# Patient Record
Sex: Male | Born: 1970
Health system: Southern US, Community
[De-identification: ages and names within clinical notes are randomized; demographics above are authoritative.]

## PROBLEM LIST (undated history)

## (undated) DIAGNOSIS — Z87442 Personal history of urinary calculi: Secondary | ICD-10-CM

## (undated) DIAGNOSIS — IMO0002 Reserved for concepts with insufficient information to code with codable children: Secondary | ICD-10-CM

## (undated) DIAGNOSIS — E669 Obesity, unspecified: Secondary | ICD-10-CM

## (undated) DIAGNOSIS — T7840XA Allergy, unspecified, initial encounter: Secondary | ICD-10-CM

## (undated) DIAGNOSIS — I358 Other nonrheumatic aortic valve disorders: Secondary | ICD-10-CM

## (undated) DIAGNOSIS — M545 Low back pain, unspecified: Secondary | ICD-10-CM

## (undated) DIAGNOSIS — N4830 Priapism, unspecified: Secondary | ICD-10-CM

## (undated) DIAGNOSIS — K219 Gastro-esophageal reflux disease without esophagitis: Secondary | ICD-10-CM

## (undated) DIAGNOSIS — F329 Major depressive disorder, single episode, unspecified: Secondary | ICD-10-CM

## (undated) DIAGNOSIS — M1990 Unspecified osteoarthritis, unspecified site: Secondary | ICD-10-CM

## (undated) DIAGNOSIS — M48061 Spinal stenosis, lumbar region without neurogenic claudication: Secondary | ICD-10-CM

## (undated) DIAGNOSIS — I10 Essential (primary) hypertension: Secondary | ICD-10-CM

## (undated) DIAGNOSIS — F1910 Other psychoactive substance abuse, uncomplicated: Secondary | ICD-10-CM

## (undated) DIAGNOSIS — Z202 Contact with and (suspected) exposure to infections with a predominantly sexual mode of transmission: Secondary | ICD-10-CM

## (undated) DIAGNOSIS — M129 Arthropathy, unspecified: Secondary | ICD-10-CM

## (undated) DIAGNOSIS — F419 Anxiety disorder, unspecified: Secondary | ICD-10-CM

## (undated) DIAGNOSIS — F32A Depression, unspecified: Secondary | ICD-10-CM

## (undated) DIAGNOSIS — I2510 Atherosclerotic heart disease of native coronary artery without angina pectoris: Secondary | ICD-10-CM

## (undated) DIAGNOSIS — R011 Cardiac murmur, unspecified: Secondary | ICD-10-CM

## (undated) DIAGNOSIS — J189 Pneumonia, unspecified organism: Secondary | ICD-10-CM

## (undated) DIAGNOSIS — D649 Anemia, unspecified: Secondary | ICD-10-CM

## (undated) HISTORY — DX: Major depressive disorder, single episode, unspecified: F32.9

## (undated) HISTORY — PX: GASTRIC BYPASS: SHX52

## (undated) HISTORY — DX: Allergy, unspecified, initial encounter: T78.40XA

## (undated) HISTORY — DX: Priapism, unspecified: N48.30

## (undated) HISTORY — DX: Low back pain: M54.5

## (undated) HISTORY — DX: Anxiety disorder, unspecified: F41.9

## (undated) HISTORY — DX: Other psychoactive substance abuse, uncomplicated: F19.10

## (undated) HISTORY — DX: Arthropathy, unspecified: M12.9

## (undated) HISTORY — PX: ABDOMINAL SURGERY: SHX537

## (undated) HISTORY — PX: BACK SURGERY: SHX140

## (undated) HISTORY — DX: Contact with and (suspected) exposure to infections with a predominantly sexual mode of transmission: Z20.2

## (undated) HISTORY — DX: Low back pain, unspecified: M54.50

## (undated) HISTORY — DX: Obesity, unspecified: E66.9

## (undated) HISTORY — DX: Reserved for concepts with insufficient information to code with codable children: IMO0002

## (undated) HISTORY — DX: Personal history of urinary calculi: Z87.442

## (undated) HISTORY — PX: ANKLE SURGERY: SHX546

## (undated) HISTORY — PX: OTHER SURGICAL HISTORY: SHX169

## (undated) HISTORY — DX: Depression, unspecified: F32.A

## (undated) HISTORY — PX: KNEE SURGERY: SHX244

## (undated) HISTORY — PX: HERNIA REPAIR: SHX51

---

## 1998-04-19 ENCOUNTER — Emergency Department (HOSPITAL_COMMUNITY): Admission: EM | Admit: 1998-04-19 | Discharge: 1998-04-19 | Payer: Self-pay | Admitting: Emergency Medicine

## 1999-05-03 ENCOUNTER — Emergency Department (HOSPITAL_COMMUNITY): Admission: EM | Admit: 1999-05-03 | Discharge: 1999-05-03 | Payer: Self-pay | Admitting: Emergency Medicine

## 2001-02-16 ENCOUNTER — Encounter: Payer: Self-pay | Admitting: Emergency Medicine

## 2001-02-16 ENCOUNTER — Emergency Department (HOSPITAL_COMMUNITY): Admission: EM | Admit: 2001-02-16 | Discharge: 2001-02-16 | Payer: Self-pay | Admitting: Emergency Medicine

## 2001-02-19 ENCOUNTER — Encounter: Payer: Self-pay | Admitting: General Surgery

## 2001-02-19 ENCOUNTER — Ambulatory Visit (HOSPITAL_COMMUNITY): Admission: RE | Admit: 2001-02-19 | Discharge: 2001-02-19 | Payer: Self-pay | Admitting: General Surgery

## 2001-03-06 HISTORY — PX: ANKLE SURGERY: SHX546

## 2001-07-12 ENCOUNTER — Emergency Department (HOSPITAL_COMMUNITY): Admission: EM | Admit: 2001-07-12 | Discharge: 2001-07-12 | Payer: Self-pay | Admitting: Emergency Medicine

## 2001-07-12 ENCOUNTER — Encounter: Payer: Self-pay | Admitting: Emergency Medicine

## 2002-03-06 HISTORY — PX: KNEE SURGERY: SHX244

## 2003-02-19 ENCOUNTER — Emergency Department (HOSPITAL_COMMUNITY): Admission: EM | Admit: 2003-02-19 | Discharge: 2003-02-19 | Payer: Self-pay | Admitting: Emergency Medicine

## 2003-03-07 HISTORY — PX: HERNIA REPAIR: SHX51

## 2004-07-03 ENCOUNTER — Inpatient Hospital Stay (HOSPITAL_COMMUNITY): Admission: EM | Admit: 2004-07-03 | Discharge: 2004-07-10 | Payer: Self-pay | Admitting: Emergency Medicine

## 2004-07-05 ENCOUNTER — Encounter: Admission: RE | Admit: 2004-07-05 | Discharge: 2004-07-05 | Payer: Self-pay | Admitting: Internal Medicine

## 2004-07-06 HISTORY — PX: INCISION AND DRAINAGE ABSCESS: SHX5864

## 2004-07-08 HISTORY — PX: IRRIGATION AND DEBRIDEMENT ABSCESS: SHX5252

## 2004-08-03 ENCOUNTER — Encounter: Admission: RE | Admit: 2004-08-03 | Discharge: 2004-11-01 | Payer: Self-pay | Admitting: Orthopedic Surgery

## 2005-09-21 ENCOUNTER — Ambulatory Visit: Payer: Self-pay | Admitting: Family Medicine

## 2005-10-03 ENCOUNTER — Ambulatory Visit: Payer: Self-pay | Admitting: Family Medicine

## 2005-10-12 ENCOUNTER — Encounter: Admission: RE | Admit: 2005-10-12 | Discharge: 2006-01-10 | Payer: Self-pay | Admitting: Family Medicine

## 2005-11-02 ENCOUNTER — Ambulatory Visit: Payer: Self-pay | Admitting: Family Medicine

## 2005-12-14 ENCOUNTER — Ambulatory Visit: Payer: Self-pay | Admitting: Family Medicine

## 2006-01-23 ENCOUNTER — Ambulatory Visit: Payer: Self-pay | Admitting: Family Medicine

## 2006-03-06 HISTORY — PX: GASTRIC BYPASS: SHX52

## 2006-05-30 ENCOUNTER — Ambulatory Visit: Payer: Self-pay | Admitting: Family Medicine

## 2006-06-07 ENCOUNTER — Ambulatory Visit: Payer: Self-pay | Admitting: Family Medicine

## 2006-06-07 LAB — CONVERTED CEMR LAB
ALT: 30 units/L (ref 0–40)
AST: 17 units/L (ref 0–37)
Albumin: 3.6 g/dL (ref 3.5–5.2)
Alkaline Phosphatase: 42 units/L (ref 39–117)
BUN: 8 mg/dL (ref 6–23)
Basophils Absolute: 0.1 10*3/uL (ref 0.0–0.1)
Basophils Relative: 0.8 % (ref 0.0–1.0)
Bilirubin, Direct: 0.2 mg/dL (ref 0.0–0.3)
CO2: 33 meq/L — ABNORMAL HIGH (ref 19–32)
Calcium: 9.4 mg/dL (ref 8.4–10.5)
Chloride: 99 meq/L (ref 96–112)
Cholesterol: 192 mg/dL (ref 0–200)
Creatinine, Ser: 0.7 mg/dL (ref 0.4–1.5)
Eosinophils Absolute: 0.1 10*3/uL (ref 0.0–0.6)
Eosinophils Relative: 0.9 % (ref 0.0–5.0)
GFR calc Af Amer: 163 mL/min
GFR calc non Af Amer: 135 mL/min
Glucose, Bld: 73 mg/dL (ref 70–99)
HCT: 45.3 % (ref 39.0–52.0)
HDL: 40.6 mg/dL (ref >39.0)
Hemoglobin: 15.4 g/dL (ref 13.0–17.0)
LDL Cholesterol: 134 mg/dL — ABNORMAL HIGH (ref 0–99)
Lymphocytes Relative: 21.4 % (ref 12.0–46.0)
MCHC: 34.1 g/dL (ref 30.0–36.0)
MCV: 85.9 fL (ref 78.0–100.0)
Monocytes Absolute: 1.2 10*3/uL — ABNORMAL HIGH (ref 0.2–0.7)
Monocytes Relative: 9.8 % (ref 3.0–11.0)
Neutro Abs: 7.9 10*3/uL — ABNORMAL HIGH (ref 1.4–7.7)
Neutrophils Relative %: 67.1 % (ref 43.0–77.0)
Platelets: 274 10*3/uL (ref 150–400)
Potassium: 3.8 meq/L (ref 3.5–5.1)
RBC: 5.27 M/uL (ref 4.22–5.81)
RDW: 12.7 % (ref 11.5–14.6)
Sodium: 139 meq/L (ref 135–145)
TSH: 1.8 microintl units/mL (ref 0.35–5.50)
Total Bilirubin: 1.2 mg/dL (ref 0.3–1.2)
Total CHOL/HDL Ratio: 4.7
Total Protein: 7.1 g/dL (ref 6.0–8.3)
Triglycerides: 86 mg/dL (ref 0–149)
VLDL: 17 mg/dL (ref 0–40)
WBC: 11.8 10*3/uL — ABNORMAL HIGH (ref 4.5–10.5)

## 2006-09-03 ENCOUNTER — Ambulatory Visit: Payer: Self-pay | Admitting: Family Medicine

## 2006-10-08 ENCOUNTER — Ambulatory Visit: Payer: Self-pay | Admitting: Family Medicine

## 2006-10-08 DIAGNOSIS — M545 Low back pain, unspecified: Secondary | ICD-10-CM | POA: Insufficient documentation

## 2006-10-08 DIAGNOSIS — I10 Essential (primary) hypertension: Secondary | ICD-10-CM | POA: Insufficient documentation

## 2006-10-17 ENCOUNTER — Telehealth: Payer: Self-pay | Admitting: Family Medicine

## 2006-11-29 ENCOUNTER — Telehealth: Payer: Self-pay | Admitting: Family Medicine

## 2006-12-10 ENCOUNTER — Ambulatory Visit: Payer: Self-pay | Admitting: Family Medicine

## 2006-12-17 ENCOUNTER — Telehealth: Payer: Self-pay | Admitting: Family Medicine

## 2007-03-12 ENCOUNTER — Ambulatory Visit: Payer: Self-pay | Admitting: Family Medicine

## 2007-03-12 DIAGNOSIS — M1711 Unilateral primary osteoarthritis, right knee: Secondary | ICD-10-CM | POA: Insufficient documentation

## 2007-04-09 ENCOUNTER — Ambulatory Visit: Payer: Self-pay | Admitting: Family Medicine

## 2007-04-09 DIAGNOSIS — F341 Dysthymic disorder: Secondary | ICD-10-CM | POA: Insufficient documentation

## 2007-04-18 ENCOUNTER — Telehealth: Payer: Self-pay | Admitting: Family Medicine

## 2007-08-08 ENCOUNTER — Telehealth: Payer: Self-pay | Admitting: Family Medicine

## 2007-08-21 ENCOUNTER — Telehealth: Payer: Self-pay | Admitting: Family Medicine

## 2007-08-22 ENCOUNTER — Ambulatory Visit: Payer: Self-pay | Admitting: Family Medicine

## 2007-08-22 DIAGNOSIS — E669 Obesity, unspecified: Secondary | ICD-10-CM | POA: Insufficient documentation

## 2007-12-12 ENCOUNTER — Telehealth: Payer: Self-pay | Admitting: Family Medicine

## 2008-02-12 ENCOUNTER — Ambulatory Visit: Payer: Self-pay | Admitting: Family Medicine

## 2008-02-13 ENCOUNTER — Encounter: Admission: RE | Admit: 2008-02-13 | Discharge: 2008-02-13 | Payer: Self-pay | Admitting: Family Medicine

## 2008-02-18 ENCOUNTER — Telehealth: Payer: Self-pay | Admitting: Family Medicine

## 2008-02-26 ENCOUNTER — Ambulatory Visit: Payer: Self-pay | Admitting: Family Medicine

## 2008-02-26 DIAGNOSIS — IMO0002 Reserved for concepts with insufficient information to code with codable children: Secondary | ICD-10-CM | POA: Insufficient documentation

## 2008-03-03 ENCOUNTER — Encounter: Admission: RE | Admit: 2008-03-03 | Discharge: 2008-03-03 | Payer: Self-pay | Admitting: Family Medicine

## 2008-03-06 HISTORY — PX: OTHER SURGICAL HISTORY: SHX169

## 2008-07-22 ENCOUNTER — Telehealth: Payer: Self-pay | Admitting: Family Medicine

## 2008-07-22 ENCOUNTER — Ambulatory Visit: Payer: Self-pay | Admitting: Family Medicine

## 2008-07-23 ENCOUNTER — Ambulatory Visit: Payer: Self-pay | Admitting: Family Medicine

## 2008-07-23 LAB — CONVERTED CEMR LAB
Bilirubin Urine: NEGATIVE
Blood in Urine, dipstick: NEGATIVE
Glucose, Urine, Semiquant: NEGATIVE
Ketones, urine, test strip: NEGATIVE
Nitrite: NEGATIVE
Protein, U semiquant: NEGATIVE
Specific Gravity, Urine: 1.015
Urobilinogen, UA: 0.2
pH: 7

## 2008-07-27 LAB — CONVERTED CEMR LAB
ALT: 43 units/L (ref 0–53)
AST: 38 units/L — ABNORMAL HIGH (ref 0–37)
Albumin: 3.5 g/dL (ref 3.5–5.2)
Alkaline Phosphatase: 49 units/L (ref 39–117)
BUN: 13 mg/dL (ref 6–23)
Basophils Absolute: 0 10*3/uL (ref 0.0–0.1)
Basophils Relative: 0 % (ref 0.0–3.0)
Bilirubin, Direct: 0.2 mg/dL (ref 0.0–0.3)
CO2: 32 meq/L (ref 19–32)
Calcium: 8.6 mg/dL (ref 8.4–10.5)
Chloride: 102 meq/L (ref 96–112)
Cholesterol: 121 mg/dL (ref 0–200)
Creatinine, Ser: 0.7 mg/dL (ref 0.4–1.5)
Eosinophils Absolute: 0.1 10*3/uL (ref 0.0–0.7)
Eosinophils Relative: 2.6 % (ref 0.0–5.0)
GFR calc non Af Amer: 132.87 mL/min (ref >60)
Glucose, Bld: 82 mg/dL (ref 70–99)
HCT: 35.5 % — ABNORMAL LOW (ref 39.0–52.0)
HDL: 41.2 mg/dL (ref >39.00)
Hemoglobin: 12.4 g/dL — ABNORMAL LOW (ref 13.0–17.0)
LDL Cholesterol: 71 mg/dL (ref 0–99)
Lymphocytes Relative: 44.7 % (ref 12.0–46.0)
Lymphs Abs: 1.9 10*3/uL (ref 0.7–4.0)
MCHC: 34.9 g/dL (ref 30.0–36.0)
MCV: 93.7 fL (ref 78.0–100.0)
Monocytes Absolute: 0.5 10*3/uL (ref 0.1–1.0)
Monocytes Relative: 10.5 % (ref 3.0–12.0)
Neutro Abs: 1.9 10*3/uL (ref 1.4–7.7)
Neutrophils Relative %: 42.2 % — ABNORMAL LOW (ref 43.0–77.0)
Platelets: 140 10*3/uL — ABNORMAL LOW (ref 150.0–400.0)
Potassium: 4.3 meq/L (ref 3.5–5.1)
RBC: 3.79 M/uL — ABNORMAL LOW (ref 4.22–5.81)
RDW: 11.9 % (ref 11.5–14.6)
Sodium: 141 meq/L (ref 135–145)
TSH: 3.13 microintl units/mL (ref 0.35–5.50)
Total Bilirubin: 0.6 mg/dL (ref 0.3–1.2)
Total CHOL/HDL Ratio: 3
Total Protein: 5.9 g/dL — ABNORMAL LOW (ref 6.0–8.3)
Triglycerides: 45 mg/dL (ref 0.0–149.0)
VLDL: 9 mg/dL (ref 0.0–40.0)
WBC: 4.4 10*3/uL — ABNORMAL LOW (ref 4.5–10.5)

## 2008-07-28 ENCOUNTER — Telehealth: Payer: Self-pay | Admitting: Family Medicine

## 2008-07-30 ENCOUNTER — Telehealth: Payer: Self-pay | Admitting: Family Medicine

## 2008-07-30 ENCOUNTER — Encounter: Admission: RE | Admit: 2008-07-30 | Discharge: 2008-07-30 | Payer: Self-pay | Admitting: Family Medicine

## 2008-08-06 ENCOUNTER — Encounter: Payer: Self-pay | Admitting: Family Medicine

## 2008-08-24 ENCOUNTER — Ambulatory Visit (HOSPITAL_COMMUNITY): Admission: RE | Admit: 2008-08-24 | Discharge: 2008-08-25 | Payer: Self-pay | Admitting: Neurosurgery

## 2008-08-24 HISTORY — PX: MICRODISCECTOMY LUMBAR: SUR864

## 2009-03-25 ENCOUNTER — Ambulatory Visit: Payer: Self-pay | Admitting: Family Medicine

## 2009-03-25 DIAGNOSIS — F1910 Other psychoactive substance abuse, uncomplicated: Secondary | ICD-10-CM | POA: Insufficient documentation

## 2009-05-12 ENCOUNTER — Emergency Department (HOSPITAL_COMMUNITY): Admission: EM | Admit: 2009-05-12 | Discharge: 2009-05-13 | Payer: Self-pay | Admitting: Emergency Medicine

## 2009-10-05 ENCOUNTER — Ambulatory Visit: Payer: Self-pay | Admitting: Family Medicine

## 2009-10-05 LAB — CONVERTED CEMR LAB
Bilirubin Urine: NEGATIVE
Blood in Urine, dipstick: NEGATIVE
Glucose, Urine, Semiquant: NEGATIVE
Ketones, urine, test strip: NEGATIVE
Nitrite: NEGATIVE
Protein, U semiquant: NEGATIVE
Specific Gravity, Urine: >=1.03
Urobilinogen, UA: 0.2
pH: 5.5

## 2009-10-13 ENCOUNTER — Ambulatory Visit: Payer: Self-pay | Admitting: Family Medicine

## 2009-10-13 DIAGNOSIS — Z87442 Personal history of urinary calculi: Secondary | ICD-10-CM | POA: Insufficient documentation

## 2009-10-13 DIAGNOSIS — N4830 Priapism, unspecified: Secondary | ICD-10-CM | POA: Insufficient documentation

## 2009-10-13 LAB — CONVERTED CEMR LAB
ALT: 25 units/L (ref 0–53)
AST: 24 units/L (ref 0–37)
Albumin: 3.5 g/dL (ref 3.5–5.2)
Alkaline Phosphatase: 76 units/L (ref 39–117)
BUN: 12 mg/dL (ref 6–23)
Basophils Absolute: 0 10*3/uL (ref 0.0–0.1)
Basophils Relative: 0.4 % (ref 0.0–3.0)
Bilirubin, Direct: 0.2 mg/dL (ref 0.0–0.3)
CO2: 26 meq/L (ref 19–32)
Calcium: 8.7 mg/dL (ref 8.4–10.5)
Chloride: 114 meq/L — ABNORMAL HIGH (ref 96–112)
Cholesterol: 129 mg/dL (ref 0–200)
Creatinine, Ser: 0.7 mg/dL (ref 0.4–1.5)
Eosinophils Absolute: 0.1 10*3/uL (ref 0.0–0.7)
Eosinophils Relative: 1.6 % (ref 0.0–5.0)
Ferritin: 11.3 ng/mL — ABNORMAL LOW (ref 22.0–322.0)
Folate: 4.7 ng/mL
GFR calc non Af Amer: 136.56 mL/min (ref >60)
Glucose, Bld: 85 mg/dL (ref 70–99)
HCT: 40.5 % (ref 39.0–52.0)
HDL: 49.3 mg/dL (ref >39.00)
Hemoglobin: 13.6 g/dL (ref 13.0–17.0)
Hgb A1c MFr Bld: 4.9 % (ref 4.6–6.5)
LDL Cholesterol: 72 mg/dL (ref 0–99)
Lymphocytes Relative: 25.9 % (ref 12.0–46.0)
Lymphs Abs: 1.3 10*3/uL (ref 0.7–4.0)
MCHC: 33.5 g/dL (ref 30.0–36.0)
MCV: 88.1 fL (ref 78.0–100.0)
Magnesium: 1.9 mg/dL (ref 1.5–2.5)
Monocytes Absolute: 0.4 10*3/uL (ref 0.1–1.0)
Monocytes Relative: 7.2 % (ref 3.0–12.0)
Neutro Abs: 3.3 10*3/uL (ref 1.4–7.7)
Neutrophils Relative %: 64.9 % (ref 43.0–77.0)
Phosphorus: 4.1 mg/dL (ref 2.3–4.6)
Platelets: 191 10*3/uL (ref 150.0–400.0)
Potassium: 4 meq/L (ref 3.5–5.1)
RBC: 4.6 M/uL (ref 4.22–5.81)
RDW: 15.4 % — ABNORMAL HIGH (ref 11.5–14.6)
Sodium: 144 meq/L (ref 135–145)
TSH: 1.23 microintl units/mL (ref 0.35–5.50)
Total Bilirubin: 1.1 mg/dL (ref 0.3–1.2)
Total CHOL/HDL Ratio: 3
Total Protein: 6.1 g/dL (ref 6.0–8.3)
Triglycerides: 38 mg/dL (ref 0.0–149.0)
VLDL: 7.6 mg/dL (ref 0.0–40.0)
Vitamin B-12: 257 pg/mL (ref 211–911)
WBC: 5.1 10*3/uL (ref 4.5–10.5)

## 2009-10-20 ENCOUNTER — Telehealth: Payer: Self-pay | Admitting: Family Medicine

## 2009-10-20 LAB — CONVERTED CEMR LAB
Calcium, Total (PTH): 8.7 mg/dL (ref 8.4–10.5)
Chlamydia, Swab/Urine, PCR: NEGATIVE
GC Probe Amp, Urine: NEGATIVE
Herpes Simplex Vrs I&II-IgM Ab (EIA): 3.5 — ABNORMAL HIGH
PTH: 76.6 pg/mL — ABNORMAL HIGH (ref 14.0–72.0)
Prealbumin: 16.2 mg/dL — ABNORMAL LOW (ref 18.0–45.0)
Vit D, 25-Hydroxy: 20 ng/mL — ABNORMAL LOW (ref 30–89)

## 2009-12-27 ENCOUNTER — Ambulatory Visit: Payer: Self-pay | Admitting: Family Medicine

## 2009-12-27 DIAGNOSIS — J029 Acute pharyngitis, unspecified: Secondary | ICD-10-CM | POA: Insufficient documentation

## 2010-03-27 ENCOUNTER — Emergency Department (HOSPITAL_COMMUNITY)
Admission: EM | Admit: 2010-03-27 | Discharge: 2010-03-27 | Payer: Self-pay | Source: Home / Self Care | Admitting: Emergency Medicine

## 2010-04-07 NOTE — Assessment & Plan Note (Signed)
Summary: cpx/cjr   Vital Signs:  Patient profile:   40 year old male Weight:      213 pounds BMI:     32.50 O2 Sat:      98 % Temp:     98.2 degrees F Pulse rate:   67 / minute Pulse rhythm:   regular BP sitting:   170 / 104  (left arm)  Vitals Entered By: Levora Angel, RN (October 13, 2009 3:07 PM) CC: cpx  states been off bp med since jan 2011 and states went [facility]   History of Present Illness: this 40 year old white man male was last seen in January when he was referred to the detox center in hospital. He went through the program and since that time has not had any hydrocodone or other analgesics since that time and has had surgical procedures done and did not take any analgesics following the surgery Patient is in for complete physical examination today Blood pressure 5 of a 170/104 and he relates he has not had any of his low pressure medicine since a last of January 2000 while in Dr. Exie Parody of plastic surgery removed the tenaculum was proximal with 6-8 weeks ago Relates he has a large tattoo on his back and above the tattoo the statement tough as nails Patient relates he has lost his job also multiple layoffs and has been known on a pull since January I have or has been on a cruise into the [employer] he is gained weight which isn't apparent from 176 to 213, and plans to be seen by the bariatric surgeons in the near future since January has had 2 sodas of renal stones with hematuria first treated and seen by Dr. Vikki Ports and his second episode of hematuria and pain worse when he was on a cruise he was very adamant and did not take any analgesic He has had recurrent episodes of priapism whichthe urologist is unable to explain and he has been treated with injections blood aspiration as well as given injections of pseudoephedrine and has taken Sudafed at home to prevent priapism  Allergies (verified): No Known Drug Allergies  Past  History:  Past Medical History: Last updated: 10/08/2006 Hypertension  Past Surgical History: Gastric bypass 07/10/2006 Rt knee surgery x 2 Lt ankle surgery  Gout rt toe Rt elbow muscle surgery Abcess lower abdomen lumbar laminectomy 4193 umbilical hernia repaired Panectomy  Review of Systems      See HPI  The patient denies anorexia, fever, weight loss, weight gain, vision loss, decreased hearing, hoarseness, chest pain, syncope, dyspnea on exertion, peripheral edema, prolonged cough, headaches, hemoptysis, abdominal pain, melena, hematochezia, severe indigestion/heartburn, hematuria, incontinence, genital sores, muscle weakness, suspicious skin lesions, transient blindness, difficulty walking, depression, unusual weight change, abnormal bleeding, enlarged lymph nodes, angioedema, breast masses, and testicular masses.    Physical Exam  General:  Well-developed,well-nourished,in no acute distress; alert,appropriate and cooperative throughout examination$, muscular but obese abdomen with multiple tattoos over upper body Head:  Normocephalic and atraumatic without obvious abnormalities. No apparent alopecia or balding. Eyes:  No corneal or conjunctival inflammation noted. EOMI. Perrla. Funduscopic exam benign, without hemorrhages, exudates or papilledema. Vision grossly normal. Ears:  External ear exam shows no significant lesions or deformities.  Otoscopic examination reveals clear canals, tympanic membranes are intact bilaterally without bulging, retraction, inflammation or discharge. Hearing is grossly normal bilaterally. Nose:  External nasal examination shows no deformity or inflammation. Nasal mucosa are pink  and moist without lesions or exudates. Mouth:  Oral mucosa and oropharynx without lesions or exudates.  Teeth in good repair. Neck:  No deformities, masses, or tenderness noted. Chest Wall:  No deformities, masses, tenderness or gynecomastia noted. Breasts:  No masses or  gynecomastia noted Lungs:  Normal respiratory effort, chest expands symmetrically. Lungs are clear to auscultation, no crackles or wheezes. Heart:  Normal rate and regular rhythm. S1 and S2 normal without gallop, murmur, click, rub or other extra sounds. Abdomen:  soft, non-tender, normal bowel sounds, no distention, no masses, no guarding, no rigidity, no rebound tenderness, no abdominal hernia, no inguinal hernia, no hepatomegaly, and no splenomegaly.  multiple operative scars, bariatric as well as a lower partial removal of the paniculus Rectal:  No external abnormalities noted. Normal sphincter tone. No rectal masses or tenderness. Genitalia:  Testes bilaterally descended without nodularity, tenderness or masses. No scrotal masses or lesions. No penis lesions or urethral discharge. penis enlarged from previous exam and appears semi-erection Prostate:  Prostate gland firm and smooth, no enlargement, nodularity, tenderness, mass, asymmetry or induration. Msk:  No deformity or scoliosis noted of thoracic or lumbar spine.   Pulses:  R and L carotid,radial,femoral,dorsalis pedis and posterior tibial pulses are full and equal bilaterally Extremities:  No clubbing, cyanosis, edema, or deformity noted with normal full range of motion of all joints.   Neurologic:  No cranial nerve deficits noted. Station and gait are normal. Plantar reflexes are down-going bilaterally. DTRs are symmetrical throughout. Sensory, motor and coordinative functions appear intact. Skin:  multiple large tattoos over his back or his chest and both arms Very tan Cervical Nodes:  No lymphadenopathy noted Axillary Nodes:  No palpable lymphadenopathy Inguinal Nodes:  No significant adenopathy Psych:  Cognition and judgment appear intact. Alert and cooperative with normal attention span and concentration. No apparent delusions, illusions, hallucinations   Impression & Recommendations:  Problem # 1:  PRIAPISM  (ICD-607.3) Assessment New Sudafed tabs as needed  Problem # 2:  NEPHROLITHIASIS, HX OF (ICD-V13.01) Assessment: Improved  Problem # 3:  PHYSICAL EXAMINATION (ICD-V70.0) Assessment: Unchanged  Problem # 4:  HERNIATED DISC (HXT-056.2) Assessment: Improved  Problem # 5:  EXOGENOUS OBESITY (ICD-278.00) Assessment: Improved improved after past surgery but now has gained weight from 176-213  Problem # 6:  HYPERTENSION (ICD-401.9) Assessment: Deteriorated  His updated medication list for this problem includes:    Lisinopril 20 Mg Tabs (Lisinopril) .Marland Kitchen... 1 each am for blood pressure  Complete Medication List: 1)  Lisinopril 20 Mg Tabs (Lisinopril) .Marland Kitchen.. 1 each am for blood pressure 2)  Vitamin D (ergocalciferol) 50000 Unit Caps (Ergocalciferol) .Marland KitchenMarland KitchenMarland Kitchen 1 weekly x 12  Other Orders: T-Drug Screen-Urine, (single) [phone]) T-Syphilis Test (RPR) [phone]) T-GC Probe, urine [phone]) T- GC Chlamydia [phone]) T- * Misc. Laboratory test [phone])  Patient Instructions: 1)  blood pressure elevated since not taking your lisinopril since January 2)  To restart Cipro HC today 20-25 q. day 3)  We'll have a drug screen blood sample drawn to verify U. are clear to the insurance company 4)  check her blood pressure at home and if it has not returned to normal within 2 weeks call Prescriptions: VITAMIN D (ERGOCALCIFEROL) 50000 UNIT CAPS (ERGOCALCIFEROL) 1 weekly x 12  #12 x 1   Entered and Authorized by:   Emeterio Reeve MD   Signed by:   Emeterio Reeve MD on 10/13/2009   Method used:   Electronically to  [facility] (retail)       [address], [place]  [address]       Ph: [phone]       Fax: [phone]   RxID:   [id number] LISINOPRIL 20 MG  TABS (LISINOPRIL) 1 each am for blood pressure  #30 x 11   Entered and Authorized by:   Emeterio Reeve MD   Signed by:   Emeterio Reeve MD on 10/13/2009   Method used:   Electronically to        [facility] (retail)       [address], [place]  [address]       Ph: [phone]       Fax: [phone]   RxID:   [id number]

## 2010-04-07 NOTE — Assessment & Plan Note (Signed)
Summary: [place] ongoing sx/nn   Vital Signs:  Patient Profile:   40 Years Old Male Weight:      217 pounds Temp:     98.6 degrees F oral Pulse rate:   70 / minute BP sitting:   132 / 80  (left arm) Cuff size:   regular                 Chief Complaint:  rash.  History of Present Illness: Onset vesicula rash on both arms, 1week ago increasing in severity, itching severely topical creams not helping Weight continues to decrease. [phone],[id number] 034 no other problems    Current Allergies: No known allergies      Review of Systems      See HPI   Physical Exam  General:     Well-developed,well-nourished,in no acute distress; alert,appropriate and cooperative throughout examination Lungs:     Normal respiratory effort, chest expands symmetrically. Lungs are clear to auscultation, no crackles or wheezes. Heart:     Normal rate and regular rhythm. S1 and S2 normal without gallop, murmur, click, rub or other extra sounds. Skin:     vesicular rash both forearms Axillary Nodes:     No palpable lymphadenopathy    Impression & Recommendations:  Problem # 1:  DERMATITIS, CONTACT, NOS (ICD-692.9) Assessment: Deteriorated  His updated medication list for this problem includes:    Sterapred Ds 12 Day 10 Mg Tabs (Prednisone) .Marland Kitchen... As directed per pkge  Orders: Depo- Medrol 28m (J1040) Admin of Therapeutic Inj  intramuscular or subcutaneous ((74259   Problem # 2:  HYPERTENSION (ICD-401.9) Assessment: Improved  His updated medication list for this problem includes:    Lisinopril 20 Mg Tabs (Lisinopril) ..Marland Kitchen.. 1 each am for blood pressure   Problem # 3:  ARTHRITIS, KNEES, BILATERAL (ICD-716.98) Assessment: Unchanged  Problem # 4:  EXOGENOUS OBESITY (ICD-278.00) Assessment: Improved  Complete Medication List: 1)  Multivitamins Tabs (Multiple vitamin) ..Marland Kitchen. 1 by mouth once daily 2)  Hydrocodone-acetaminophen 10-650 Mg Tabs (Hydrocodone-acetaminophen) ..Marland Kitchen. 1  every 6 hours as needed pain 3)  Lisinopril 20 Mg Tabs (Lisinopril) ..Marland Kitchen. 1 each am for blood pressure 4)  Temazepam 30 Mg Caps (Temazepam) ..Marland Kitchen. 1 at bedtime as needed sleep 5)  Lexapro 10 Mg Tabs (Escitalopram oxalate) ..Marland Kitchen. 1 each day 6)  Remeron 30 Mg Tabs (Mirtazapine) .... Take 1 at bedtime 7)  Hydroxyzine Hcl 25 Mg Tabs (Hydroxyzine hcl) ..Marland Kitchen. 1 three times a day  to prevent itching 8)  Sterapred Ds 12 Day 10 Mg Tabs (Prednisone) .... As directed per pkge   Patient Instructions: 1)  contact dermatitis 2)  depomedrol 120 mg IM 3)  atarax 25 mg morning  midafternoon and bedtime to prevent itching 4)  continue other medications 5)  Will give you a rx for prednisone to hold so if [place] beach   Prescriptions: STERAPRED DS 12 DAY 10 MG  TABS (PREDNISONE) as directed per pkge  #1 x 0   Entered and Authorized by:   WEmeterio ReeveMD   Signed by:   WEmeterio ReeveMD on 08/22/2007   Method used:   Print then Give to Patient   RxID:   [id number] HCL 25 MG  TABS (HYDROXYZINE HCL) 1 three times a day  to prevent itching  #30 x 2   Entered and Authorized by:   WEmeterio ReeveMD   Signed by:   WEmeterio Reeve  MD on 08/22/2007   Method used:   Electronically sent to ...       [facility]       [place], [place]  [address]       Ph: [phone]       Fax: [phone]   RxID:   [id number]  ]  Medication Administration  Injection # 1:    Medication: Depo- Medrol 89m    Diagnosis: DERMATITIS, CONTACT, NOS ([id number])    Route: IM    Site: RUOQ gluteus    Exp Date: 10/2008    Lot #: [id number]    Mfr: sircor    Comments: 120 mg given     Patient tolerated injection without complications    Given by: RLevora Angel RN (August 22, 2007 2:36 PM)  Orders Added: 1)  Depo- Medrol 844m[J1040] 2)  Admin of Therapeutic Inj  intramuscular or subcutaneous [96372] 3)  Est. Patient Level III  [9[27639]

## 2010-04-07 NOTE — Assessment & Plan Note (Signed)
Summary: ROV/sa   Vital Signs:  Patient Profile:   40 Years Old Male Weight:      189 pounds O2 Sat:      98 % Temp:     98.3 degrees F Pulse rate:   80 / minute BP sitting:   130 / 80  (left arm) Cuff size:   regular  Vitals Entered By: Levora Angel, RN (February 26, 2008 12:22 PM)                 Chief Complaint:  wants test results .  History of Present Illness: Pt in for follow up back pain, MRI reveals herniated disc T12-L1 Has been managing pain better  than since last visit Need to arrange for epidural injection rather than increasing analgesia and or surgery, if injections do not help to refer to Neurosurgeon roblem sleepin so to try remeron 30 mg hs     Current Allergies: No known allergies      Review of Systems      See HPI   Physical Exam  General:     Well-developed,well-nourished,in no acute distress; alert,appropriate and cooperative throughout examination Lungs:     Normal respiratory effort, chest expands symmetrically. Lungs are clear to auscultation, no crackles or wheezes. Heart:     Normal rate and regular rhythm. S1 and S2 normal without gallop, murmur, click, rub or other extra sounds. Msk:     tender over T12 area Extremities:     No clubbing, cyanosis, edema, or deformity noted with normal full range of motion of all joints.   Skin:     excessive skin due to severe weight loss following surgery    Impression & Recommendations:  Problem # 1:  HERNIATED DISC (ICD-722.2) Assessment: Deteriorated  Problem # 2:  INSOMNIA (ICD-780.52) Assessment: New  Problem # 3:  BACK PAIN, LUMBAR (ICD-724.2) Assessment: Deteriorated  His updated medication list for this problem includes:    Hydrocodone-acetaminophen 10-650 Mg Tabs (Hydrocodone-acetaminophen) .Marland Kitchen... 1 three times a day ac and 2 hs    Diclofenac Sodium 75 Mg Tbec (Diclofenac sodium) .Marland Kitchen... 1 two times a day for inflammation   Problem # 4:  HYPERTENSION (ICD-401.9)  Assessment: Improved  His updated medication list for this problem includes:    Lisinopril 20 Mg Tabs (Lisinopril) .Marland Kitchen... 1 each am for blood pressure   Complete Medication List: 1)  Multivitamins Tabs (Multiple vitamin) .Marland Kitchen.. 1 by mouth once daily 2)  Hydrocodone-acetaminophen 10-650 Mg Tabs (Hydrocodone-acetaminophen) .Marland Kitchen.. 1 three times a day ac and 2 hs 3)  Lisinopril 20 Mg Tabs (Lisinopril) .Marland Kitchen.. 1 each am for blood pressure 4)  Remeron 30 Mg Tabs (Mirtazapine) .... May take  1 or 2 at bedtime  for sleep and do not exceed 2 per day 5)  Hydroxyzine Hcl 25 Mg Tabs (Hydroxyzine hcl) .Marland Kitchen.. 1 three times a day  to prevent itching 6)  Librium 10 Mg Caps (Chlordiazepoxide hcl) .Marland Kitchen.. 1 qid ac and hs 7)  Diclofenac Sodium 75 Mg Tbec (Diclofenac sodium) .Marland Kitchen.. 1 two times a day for inflammation 8)  Lorazepam 2 Mg Tabs (Lorazepam) .Marland Kitchen.. 1 three times a day ac and 2 hs fill on schedule   Patient Instructions: 1)  Herniated disc, to arrange for epidural injections 2)  Lorazepam 50m  4 ntimes per day 3)  irrritable bowel , tx hydromox .375 two times a day 4)  remeron 30 mg bedtime   Prescriptions: REMERON 30 MG  TABS (MIRTAZAPINE) may take  1 or 2 at bedtime  for sleep and DO NOT EXCEED 2 PER DAY  #60 x 1   Entered by:   Levora Angel, RN   Authorized by:   Emeterio Reeve MD   Signed by:   Levora Angel, RN on 03/02/2008   Method used:   Electronically to        [facility] (retail)       [place], [place]  [address]       Ph: [phone]       Fax: [phone]   RxID:   [id number] LORAZEPAM 2 MG TABS (LORAZEPAM) 1 three times a day ac and 2 hs fill on schedule  #150 x 2   Entered and Authorized by:   Emeterio Reeve MD   Signed by:   Emeterio Reeve MD on 02/26/2008   Method used:   Print then Give to Patient   RxID:   [id number]  ]

## 2010-04-07 NOTE — Progress Notes (Signed)
Summary: Pt needs info on meds  Phone Note Call from Patient Call back at [phone]   Caller: Patient Summary of Call: PATIENT WOULD [place] TO CALL HIS WIFE, TERESA, AND GIVE HER ALL THE INFO ABOUT THE MEDS DR. FSELTRVU GAVE HIM TODAY. TERESA'S PHONE IN [phone] CELL OR [phone] WORK.  Initial call taken by: Braulio Bosch,  Jul 22, 2008 1:46 PM  Follow-up for Phone Call        notified by dr Joni Fears.  Follow-up by: Levora Angel, RN,  Jul 22, 2008 2:54 PM

## 2010-04-07 NOTE — Progress Notes (Signed)
Summary: call concerns  Phone Note Call from Patient   Caller: Patient Summary of Call: call has concerns Initial call taken by: Levora Angel, RN,  November 29, 2006 10:23 AM  Follow-up for Phone Call        retruned pt call-  no answer. lmom to return call  Follow-up by: Levora Angel, RN,  November 29, 2006 10:24 AM  Additional Follow-up for Phone Call Additional follow up Details #1::        notified pt and stated he had BRB with stool today and had about 2 stools yesst that was dark. pt stated was out of town in [place]. Informeed pt that with his recent bartiartic surgery to go to the nearest ER.  Dr Joni Fears aware. Additional Follow-up by: Levora Angel, RN,  November 29, 2006 5:12 PM

## 2010-04-07 NOTE — Assessment & Plan Note (Signed)
Summary: blood pressure check/mhf   Vital Signs:  Patient Profile:   40 Years Old Male Weight:      252 pounds Temp:     97.9 degrees F BP sitting:   170 / 110  Vitals Entered By: Karel Jarvis RN (March 12, 2007 2:45 PM)                 Chief Complaint:  follow up on blood pressure.  History of Present Illness: Pt has lost 454-254 , began having headaches band found BP 190/ then came to be checked arthritis acting up since he became more active no other systems envolved  Current Allergies (reviewed today): No known allergies      Review of Systems      See HPI   Physical Exam  General:     overweight-appearing.   Head:     Normocephalic and atraumatic without obvious abnormalities. No apparent alopecia or balding. Eyes:     No corneal or conjunctival inflammation noted. EOMI. Perrla. Funduscopic exam benign, without hemorrhages, exudates or papilledema. Vision grossly normal. Ears:     External ear exam shows no significant lesions or deformities.  Otoscopic examination reveals clear canals, tympanic membranes are intact bilaterally without bulging, retraction, inflammation or discharge. Hearing is grossly normal bilaterally. Nose:     External nasal examination shows no deformity or inflammation. Nasal mucosa are pink and moist without lesions or exudates. Mouth:     Oral mucosa and oropharynx without lesions or exudates.  Teeth in good repair. Neck:     No deformities, masses, or tenderness noted. Lungs:     Normal respiratory effort, chest expands symmetrically. Lungs are clear to auscultation, no crackles or wheezes. Heart:     Normal rate and regular rhythm. S1 and S2 normal without gallop, murmur, click, rub or other extra sounds. Extremities:     1+ left pedal edema and 1+ right pedal edema.      Complete Medication List: 1)  Multivitamins Tabs (Multiple vitamin) .Marland Kitchen.. 1 by mouth once daily 2)  Hydrocodone-acetaminophen 10-650 Mg Tabs  (Hydrocodone-acetaminophen) .Marland Kitchen.. 1 every 6 hours as needed pain 3)  Lisinopril 20 Mg Tabs (Lisinopril) .Marland Kitchen.. 1 each am for blood pressure   Patient Instructions: 1)  decrease salt intake 2)  start taking bp rx at hs    Prescriptions: LISINOPRIL 20 MG  TABS (LISINOPRIL) 1 each am for blood pressure  #30 x 11   Entered and Authorized by:   Emeterio Reeve MD   Signed by:   Emeterio Reeve MD on 03/12/2007   Method used:   Electronically sent to ...       [facility]       [place], [place]  [address]       Ph: [phone]       Fax: [phone]   RxID:   [id number]  ]

## 2010-04-07 NOTE — Progress Notes (Signed)
Summary: needs rx  Phone Note Call from Patient Call back at [phone]   Caller: Patient Call For: Bradley [name] Summary of Call: poison [place]y has gotten better but still having some discomfort.  Wants to know if he could have the prednisone pack called in like you mentioned.  [facility] [address] Initial call taken by: Townsend Roger, [place],  December 17, 2006 1:31 PM  Follow-up for Phone Call        call in [place] for 12 days Follow-up by: Laurey Morale MD,  December 17, 2006 6:02 PM  Additional Follow-up for Phone Call Additional follow up Details #1::        Phone Call Completed, Rx Called In Additional Follow-up by: Townsend Roger, [place],  December 18, 2006 8:38 AM    New/Updated Medications: STERAPRED DS 12 DAY 10 MG  TABS (PREDNISONE) 1 by mouth once daily   Prescriptions: STERAPRED DS 12 DAY 10 MG  TABS (PREDNISONE) 1 by mouth once daily  #12 days x 0   Entered by:   Townsend Roger, CMA   Authorized by:   Laurey Morale MD   Signed by:   Townsend Roger, CMA on 12/18/2006   Method used:   Electronically sent to ...       [facility]       [place], [place]  [address]       Ph: [phone]       Fax: [phone]   RxID:   [id number]

## 2010-04-07 NOTE — Progress Notes (Signed)
Summary: MED INCREASE  Phone Note Call from Patient Call back at [phone]   Caller: Patient Call For: DR STAFFORD Summary of Call: PT DOUBLE UP ON HIS SLEEP PILLS TEMAZEPAM 30MG #30. PT STATED DOC SAID HE WOULD INCREASE QTY TO 60 PILLS PER MONTH [facility] [address] [id number] Initial call taken by: Glo Herring,  April 18, 2007 10:30 AM  Follow-up for Phone Call        spoke with pt concerned about mother having alzheimers and in the hospital. informed pt dr stfford preferred not increasing temazpeap but adding remeron  . pt verbalized understanding  Follow-up by: Levora Angel, RN,  April 18, 2007 12:52 PM    New/Updated Medications: REMERON 30 MG  TABS (MIRTAZAPINE) take 1 at bedtime   Prescriptions: REMERON 30 MG  TABS (MIRTAZAPINE) take 1 at bedtime  #30 x 4   Entered by:   Levora Angel, RN   Authorized by:   Emeterio Reeve MD   Signed by:   Levora Angel, RN on 04/18/2007   Method used:   Telephoned to ...       [facility]       [place], [place]  [address]       Ph: [phone]       Fax: [phone]   RxID:   [id number]

## 2010-04-07 NOTE — Progress Notes (Signed)
Summary: Pt called req lab results. Pls call asap  Phone Note Call from Patient Call back at [phone]        Caller: Patient Summary of Call: Pt called req to get lab results. Pls call back asap.  Initial call taken by: Braulio Bosch,  October 20, 2009 12:49 PM  Follow-up for Phone Call        pt awaare. Follow-up by: Levora Angel, RN,  October 20, 2009 3:54 PM

## 2010-04-07 NOTE — Progress Notes (Signed)
Summary: TO GINA  Phone Note Call from Patient Call back at [phone]   Caller: [place] Call For: DR STAFFORD Summary of Call: RETURNING GINA'S CALL.Marland Kitchen Initial call taken by: Despina Arias,  August 08, 2007 2:57 PM  Follow-up for Phone Call        [place] wanted to know if papers been done for motorized w/c informed him dr Joni Fears will come in tomorrow to complete. Follow-up by: Levora Angel, RN,  August 08, 2007 3:42 PM

## 2010-04-07 NOTE — Progress Notes (Signed)
Summary: phone call pt in [facility]  Phone Note Call from Patient   Caller: Patient Call For: stafford Summary of Call: thinks has kidney stone in [facility] Initial call taken by: Levora Angel, RN,  October 17, 2006 10:41 AM  Follow-up for Phone Call        call pt and tell himn to go to ER in [facility] or an urgent care  Follow-up by: Levora Angel, RN,  October 17, 2006 10:41 AM  Additional Follow-up for Phone Call Additional follow up Details #1::        Left message on pt phone, [phone] informing him of Dr Boykin Reaper recommendation. Additional Follow-up by: Nira Conn LPN,  October 16, 908 3:59 PM

## 2010-04-07 NOTE — Assessment & Plan Note (Signed)
Summary: 4 week roa/jls   Vital Signs:  Patient Profile:   40 Years Old Male Weight:      244 pounds Temp:     97.9 degrees F Pulse rate:   66 / minute BP sitting:   140 / 94  (left arm) Cuff size:   large  Vitals Entered By: Levora Angel, RN (April 09, 2007 2:34 PM)                 Chief Complaint:  reck bp soome headaches has lost 216 lbs.  History of Present Illness: headaches have decreased. energetic lost 216 lbs BP rechecked  140/95 Mother is demented and pt has become anxious and depressed, to try Lexapro 10 mg once daily return in two months for BP and follow up pt doing well  Current Allergies (reviewed today): No known allergies      Review of Systems      See HPI   Physical Exam  General:     Well-developed,well-nourished,in no acute distress; alert,appropriate and cooperative throughout examinationoverweight-appearing.   Eyes:     No corneal or conjunctival inflammation noted. EOMI. Perrla. Funduscopic exam benign, without hemorrhages, exudates or papilledema. Vision grossly normal. Ears:     External ear exam shows no significant lesions or deformities.  Otoscopic examination reveals clear canals, tympanic membranes are intact bilaterally without bulging, retraction, inflammation or discharge. Hearing is grossly normal bilaterally. Nose:     External nasal examination shows no deformity or inflammation. Nasal mucosa are pink and moist without lesions or exudates. Mouth:     Oral mucosa and oropharynx without lesions or exudates.  Teeth in good repair. Lungs:     Normal respiratory effort, chest expands symmetrically. Lungs are clear to auscultation, no crackles or wheezes. Heart:     Normal rate and regular rhythm. S1 and S2 normal without gallop, murmur, click, rub or other extra sounds. Abdomen:     Bowel sounds positive,abdomen soft and non-tender without masses, organomegaly or hernias noted. Skin:     Intact without suspicious lesions  or rashes Psych:     Cognition and judgment appear intact. Alert and cooperative with normal attention span and concentration. No apparent delusions, illusions, hallucinations    Complete Medication List: 1)  Multivitamins Tabs (Multiple vitamin) .Marland Kitchen.. 1 by mouth once daily 2)  Hydrocodone-acetaminophen 10-650 Mg Tabs (Hydrocodone-acetaminophen) .Marland Kitchen.. 1 every 6 hours as needed pain 3)  Lisinopril 20 Mg Tabs (Lisinopril) .Marland Kitchen.. 1 each am for blood pressure 4)  Temazepam 30 Mg Caps (Temazepam) .Marland Kitchen.. 1 at bedtime as needed sleep 5)  Lexapro 10 Mg Tabs (Escitalopram oxalate) .Marland Kitchen.. 1 each day   Patient Instructions: 1)  continue lisinopril 20 mg once daily 2)  start lexapro 13m each day for anxiety and depression 3)  cont hydrocodone for paiin 4)  return 2 months    ]

## 2010-04-07 NOTE — Assessment & Plan Note (Signed)
Summary: ? POISON IVY/CCM   Vital Signs:  Patient Profile:   40 Years Old Male Weight:      298 pounds Pulse rate:   64 / minute Pulse rhythm:   regular BP sitting:   130 / 94  (left arm)  Vitals Entered By: Townsend Roger, CMA (December 10, 2006 3:54 PM)                 Chief Complaint:  posion ivy on arms.  History of Present Illness: Was clearing some brush over the weekend and developed an itchy rash over both arms. Using Calamine.  Current Allergies: No known allergies      Review of Systems      See HPI   Physical Exam  General:     overweight-appearing.   Skin:     diffuse red papules and wheals over both arms    Impression & Recommendations:  Problem # 1:  DERMATITIS, CONTACT, NOS (ICD-692.9)  The following medications were removed from the medication list:    Sterapred Ds 12 Day 10 Mg Tabs (Prednisone) .Marland Kitchen... As directed  Orders: Depo- Medrol 26m (J1040) Admin of Therapeutic Inj  intramuscular or subcutaneous (([id number]   Complete Medication List: 1)  Multivitamins Tabs (Multiple vitamin) ..Marland Kitchen. 1 by mouth once daily 2)  Hydrocodone-acetaminophen 10-650 Mg Tabs (Hydrocodone-acetaminophen) ..Marland Kitchen. 1 every 6 hours as needed pain   Patient Instructions: 1)  given DepoMedrol 80 mg IM. Use Benadryl lotion as needed . 2)  Please schedule a follow-up appointment as needed.    ]  Medication Administration  Injection # 1:    Medication: Depo- Medrol 873m   Diagnosis: DERMATITIS, CONTACT, NOS (ICD-692.9)    Route: IM    Site: RUOQ gluteus    Exp Date: 12/09    Lot #: [id number]    Mfr: Sicor    Patient tolerated injection without complications    Given by: CiTownsend RogerCMNoyackOctober  6, 2008 4:50 PM)  Orders Added: 1)  Est. Patient Level III [9[11914])  Depo- Medrol 8015mJ1040] 3)  Admin of Therapeutic Inj  intramuscular or subcutaneous [90[78295]

## 2010-04-07 NOTE — Progress Notes (Signed)
Summary: new med not working   Phone Note Call from Patient Call back at [phone]   Caller: pt live Call For: Joni Fears Summary of Call: the new medicine is not working.  His stomach is upset and the side effects are bothering him.   Initial call taken by: Shanon Payor,  February 18, 2008 8:19 AM  Follow-up for Phone Call        Lorazepam 1 mg. one by mouth three times a day as needed anxiety. #90 Hycosamine .375 mg. one q am and pm3 as needed IBS. #60  Dr. Joni Fears called in to [facility] ([place]) [place] Librium.  Follow-up by: Deanna Artis CMA,  February 18, 2008 11:18 AM

## 2010-04-07 NOTE — Assessment & Plan Note (Signed)
Summary: med check/mhf   Vital Signs:  Patient Profile:   40 Years Old Male Weight:      186 pounds O2 Sat:      95 % Temp:     98.3 degrees F Pulse rate:   95 / minute BP sitting:   156 / 86  (left arm) Cuff size:   regular  Vitals Entered By: Levora Angel, RN (February 12, 2008 1:08 PM)                 Chief Complaint:  pain management.  History of Present Illness: Repeat BP  140/80 Pt returns  for evaluation of pain management, pt had been procuring hydrocodone off the street as well as what was prescribed and abused use and became addicted to hydrocodone and when ntired to decrease had withdrawal symptoms and we have attempted to treat him but if does not respond to refer to [facility] or [place].discussed plan 24ftx for 20 minutes pt desires in future to have plastic surgery to remove excessive skin Pt continues to have severe lumbar pain, to get MRI, pain radiates into hip    Current Allergies (reviewed today): No known allergies      Review of Systems      See HPI  MS      See HPI      Complains of low back pain and mid back pain.   Physical Exam  General:     Well-developed,well-nourished,in no acute distress; alert,appropriate and cooperative throughout examination Lungs:     Normal respiratory effort, chest expands symmetrically. Lungs are clear to auscultation, no crackles or wheezes. Heart:     Normal rate and regular rhythm. S1 and S2 normal without gallop, murmur, click, rub or other extra sounds. Msk:     tender over T12-L1 and loer lumbar spine, no litation straight leg raising Pulses:     R and L carotid,radial,femoral,dorsalis pedis and posterior tibial pulses are full and equal bilaterally Extremities:     No clubbing, cyanosis, edema, or deformity noted with normal full range of motion of all joints.      Impression & Recommendations:  Problem # 1:  DRUG ABUSE (ICD-305.90) Assessment: New  Problem # 2:  IRRITABLE  BOWEL SYNDROME (ICD-564.1) Assessment: Deteriorated  Problem # 3:  BACK PAIN, LUMBAR (ICD-724.2) Assessment: Deteriorated  His updated medication list for this problem includes:    Hydrocodone-acetaminophen 10-650 Mg Tabs (Hydrocodone-acetaminophen) ..Marland Kitchen.. 1 three times a day ac and 2 hs    Diclofenac Sodium 75 Mg Tbec (Diclofenac sodium) ..Marland Kitchen.. 1 two times a day for inflammation  Orders: Radiology Referral (Radiology)   Complete Medication List: 1)  Multivitamins Tabs (Multiple vitamin) ..Marland Kitchen. 1 by mouth once daily 2)  Hydrocodone-acetaminophen 10-650 Mg Tabs (Hydrocodone-acetaminophen) ..Marland Kitchen. 1 three times a day ac and 2 hs 3)  Lisinopril 20 Mg Tabs (Lisinopril) ..Marland Kitchen. 1 each am for blood pressure 4)  Remeron 30 Mg Tabs (Mirtazapine) .... May take  1 or 2 at bedtime  for sleep and do not exceed 2 per day 5)  Hydroxyzine Hcl 25 Mg Tabs (Hydroxyzine hcl) ..Marland Kitchen. 1 three times a day  to prevent itching 6)  Librium 10 Mg Caps (Chlordiazepoxide hcl) ..Marland Kitchen. 1 qid ac and hs 7)  Diclofenac Sodium 75 Mg Tbec (Diclofenac sodium) ..Marland Kitchen. 1 two times a day for inflammation 8)  Lorazepam 2 Mg Tabs (Lorazepam) ..Marland Kitchen. 1 three times a day ac and 2 hs fill on schedule 9)  Hyoscyamine Sulfate Ir/sr 0.375 Mg Cr-tabs (Hyoscyamine sulfate) .Marland Kitchen.. 1 two times a day for irritable bowel   Patient Instructions: 1)  schedule MRI 2)  take medicatons as instructed   Prescriptions: HYOSCYAMINE SULFATE IR/SR 0.375 MG CR-TABS (HYOSCYAMINE SULFATE) 1 two times a day for irritable bowel  #60 x 11   Entered and Authorized by:   Emeterio Reeve MD   Signed by:   Emeterio Reeve MD on 03/05/2008   Method used:   Print then Give to Patient   RxID:   [id number] DICLOFENAC SODIUM 75 MG TBEC (DICLOFENAC SODIUM) 1 two times a day for inflammation  #60 x 11   Entered and Authorized by:   Emeterio Reeve MD   Signed by:   Emeterio Reeve MD on 02/12/2008   Method used:   Electronically to        [facility] (retail)       [place], [place]  [address]       Ph: [phone]       Fax: [phone]   RxID:   [id number] HYDROCODONE-ACETAMINOPHEN 10-650 MG  TABS (HYDROCODONE-ACETAMINOPHEN) 1 three times a day ac and 2 hs  #150 x 5   Entered and Authorized by:   Emeterio Reeve MD   Signed by:   Emeterio Reeve MD on 02/12/2008   Method used:   Print then Give to Patient   RxID:   [id number] LIBRIUM 10 MG CAPS (CHLORDIAZEPOXIDE HCL) 1 qid ac and hs  #120 x 5   Entered and Authorized by:   Emeterio Reeve MD   Signed by:   Emeterio Reeve MD on 02/12/2008   Method used:   Print then Give to Patient   RxID:   [id number]  ]

## 2010-04-07 NOTE — Assessment & Plan Note (Signed)
Summary: hip pain/njr   Vital Signs:  Patient Profile:   40 Years Old Male Weight:      339 pounds (154.09 kg) Temp:     98.5 degrees F (36.94 degrees C) oral Pulse rate:   69 / minute BP sitting:   128 / 74  (left arm)  Vitals Entered By: Townsend Roger, CMA (October 08, 2006 4:58 PM)               Chief Complaint:  lt hip pain.  History of Present Illness: Sudden onset left low back and hip pain 3 days ago. No recent trauma. Using heat and Vicodin with little response.  Current Allergies: No known allergies   Past Medical History:    Hypertension  Past Surgical History:    Gastric bypass 07/10/2006    Rt knee surgery x 2    Lt ankle surgery     Gout rt toe    Rt elbow muscle surgery    Abcess lower abdomen         Review of Systems      See HPI   Physical Exam  General:     Well-developed,well-nourished,in no acute distress; alert,appropriate and cooperative throughout examination Msk:     tender in left lumbar area, full ROM    Impression & Recommendations:  Problem # 1:  BACK PAIN, LUMBAR (ICD-724.2)  His updated medication list for this problem includes:    Hydrocodone-acetaminophen 10-650 Mg Tabs (Hydrocodone-acetaminophen) .Marland Kitchen... 1 every 6 hours as needed pain   Complete Medication List: 1)  Multivitamins Tabs (Multiple vitamin) .Marland Kitchen.. 1 by mouth once daily 2)  Hydrocodone-acetaminophen 10-650 Mg Tabs (Hydrocodone-acetaminophen) .Marland Kitchen.. 1 every 6 hours as needed pain 3)  Sterapred Ds 12 Day 10 Mg Tabs (Prednisone) .... As directed   Patient Instructions: 1)  Please schedule a follow-up appointment as needed.    Prescriptions: STERAPRED DS 12 DAY 10 MG  TABS (PREDNISONE) as directed  #1 x 0   Entered and Authorized by:   Laurey Morale MD   Signed by:   Laurey Morale MD on 10/08/2006   Method used:   Print then Give to Patient   RxID:   [id number] HYDROCODONE-ACETAMINOPHEN 10-650 MG  TABS (HYDROCODONE-ACETAMINOPHEN) 1 every 6 hours as  needed pain  #60 x 0   Entered and Authorized by:   Laurey Morale MD   Signed by:   Laurey Morale MD on 10/08/2006   Method used:   Print then Give to Patient   RxID:   [id number]

## 2010-04-07 NOTE — Assessment & Plan Note (Signed)
Summary: fup meds//ccm   Vital Signs:  Patient profile:   40 year old male Weight:      188 pounds O2 Sat:      96 % Temp:     97.7 degrees F Pulse rate:   82 / minute BP sitting:   136 / 84  (left arm)  Vitals Entered By: Levora Angel, RN (March 25, 2009 4:16 PM) CC: needs refills stated Dr Trenton Gammon been giving him hydrocoodne and now he has discharged him from his care. ? refill remeron has not been taking.  not sleeping  Is Patient Diabetic? No   History of Present Illness: This 40 year old white male has had chronic back problem for some time and has been on hydrocodone to excess. He had back surgery by Dr. Trenton Gammon June 2010 and was just released in the past month. Dr. Trenton Gammon then been prescribing hydrocodone 8-10 per day 10 325  He then referred patient to the pain clinic and they would not treat him except to refer him to a methadone clinic. The patient was unhappy about this and would not go to the methadone clinic and came to me to prescribe his hydrocodone. Patient relates he is not sleeping and has not been taking the Remeron because his insurance would not pay for the Remeron he has continued on the lisinopril and his blood pressure has been stable  Allergies (verified): No Known Drug Allergies  Past History:  Past Medical History: Last updated: 10/08/2006 Hypertension  Past Surgical History: Gastric bypass 07/10/2006 Rt knee surgery x 2 Lt ankle surgery  Gout rt toe Rt elbow muscle surgery Abcess lower abdomen lumbar laminectomy 2010  Physical Exam  General:  Well-developed,well-nourished,in no acute distress; alert,appropriate and cooperative throughout examination Lungs:  Normal respiratory effort, chest expands symmetrically. Lungs are clear to auscultation, no crackles or wheezes. Heart:  Normal rate and regular rhythm. S1 and S2 normal without gallop, murmur, click, rub or other extra sounds. Skin:  multiple tattoos over his arms and body   Impression &  Recommendations:  Problem # 1:  NARCOTIC ABUSE (ICD-305.90) Assessment New  Problem # 2:  INSOMNIA (ICD-780.52) Assessment: Deteriorated  Problem # 3:  HERNIATED DISC (VHQ-469.2) Assessment: Improved  Problem # 4:  HYPERTENSION (ICD-401.9) Assessment: Improved  His updated medication list for this problem includes:    Lisinopril 20 Mg Tabs (Lisinopril) .Marland Kitchen... 1 each am for blood pressure  Complete Medication List: 1)  Lisinopril 20 Mg Tabs (Lisinopril) .Marland Kitchen.. 1 each am for blood pressure 2)  Hydrocodone-acetaminophen 10-325 Mg Tabs (Hydrocodone-acetaminophen) .Marland Kitchen.. 1-2 q4h as needed for pain and addiction  Patient Instructions: 1)  I do not treat patients for addiction but I will prescibe 1 months hydrocodone for you so you can go to the detoxification center in [facility]. I can not continue to treat this problem. Prescriptions: HYDROCODONE-ACETAMINOPHEN 10-325 MG TABS (HYDROCODONE-ACETAMINOPHEN) 1-2 q4h as needed for pain and addiction  #240 x 0   Entered and Authorized by:   Emeterio Reeve MD   Signed by:   Emeterio Reeve MD on 03/25/2009   Method used:   Print then Give to Patient   RxID:   [id number]

## 2010-04-07 NOTE — Progress Notes (Signed)
Summary: Poison oak ongoing, scheduled oV  Phone Note Call from Patient Call back at [phone]   Summary of Call: RETURNING GINA'S CALL. HAS POISON OAK. WOULD LIKE PREDNISONE CALLED TO [facility] ON [address]. ANY ? CALL PT. Initial call taken by: Despina Arias,  August 21, 2007 3:49 PM  Follow-up for Phone Call        needs appt tomoorrow  Follow-up by: Levora Angel, RN,  August 21, 2007 5:07 PM  Additional Follow-up for Phone Call Additional follow up Details #1::        Scheduled OV 6/18 @ 2pm Additional Follow-up by: Nira Conn LPN,  August 21, 7412 23:95 AM

## 2010-04-07 NOTE — Assessment & Plan Note (Signed)
Summary: flu like sxs//ccm   Vital Signs:  Patient profile:   40 year old male Height:      68 inches (172.72 cm) Weight:      219 pounds (99.55 kg) O2 Sat:      99 % on Room air Temp:     97.6 degrees F (36.44 degrees C) oral Pulse rate:   64 / minute BP sitting:   130 / 92  (left arm) Cuff size:   regular  Vitals Entered By: Gardenia Phlegm RMA (December 27, 2009 11:15 AM)  O2 Flow:  Room air CC: Flu like symptoms- sore throat, cough w/ little phlegm (no color)/ CF Is Patient Diabetic? No   History of Present Illness: Patient in today evaluation of sore throat. Symptoms started 5-6 days ago. He has hoarseness, cocaine, malaise, myalgias, fatigue cough productive of clear to whitish sputum occasionally the sputum is yellow. His cough is vigorous enough to cause some chest pain when he coughs at this point, he is struggling with some nasal congestion and a low-grade generalized headache as well. He is a nonsmoker and is concerned because he is due to have surgery this coming Friday to remove some excess abdominal pannus and skin. Denies palpitations, chest pain when not coughing or worsening shortness of breath no GI or GU.  Current Medications (verified): 1)  Lisinopril 20 Mg  Tabs (Lisinopril) .Marland Kitchen.. 1 Each Am For Blood Pressure 2)  Vitamin D (Ergocalciferol) 50000 Unit Caps (Ergocalciferol) .Marland Kitchen.. 1 Weekly X 12 3)  Daily Multiple Vitamins  Tabs (Multiple Vitamin) .... Once Daily  Allergies (verified): No Known Drug Allergies  Past History:  Past medical history reviewed for relevance to current acute and chronic problems. Social history (including risk factors) reviewed for relevance to current acute and chronic problems.  Past Medical History: Reviewed history from 10/08/2006 and no changes required. Hypertension  Social History: Reviewed history and no changes required.  Review of Systems      See HPI  Physical Exam  General:  Well-developed,well-nourished,in no acute  distress; alert,appropriate and cooperative throughout examination Head:  Normocephalic and atraumatic without obvious abnormalities. No apparent alopecia or balding. Ears:  External ear exam shows no significant lesions or deformities.  Otoscopic examination reveals clear canals, tympanic membranes are intact bilaterally without bulging, retraction, inflammation or discharge. Hearing is grossly normal bilaterally. Nose:  mucosal erythema and mucosal edema.   Mouth:  Oral mucosa and oropharynx without lesions or exudates.  Teeth in good repair. Slight erythema posterior oropharynx Neck:  No deformities, masses, or tenderness noted. Lungs:  Normal respiratory effort, chest expands symmetrically. Lungs are clear to auscultation, no crackles or wheezes. Heart:  Normal rate and regular rhythm. S1 and S2 normal without gallop, murmur, click, rub or other extra sounds. Abdomen:  Bowel sounds positive,abdomen soft and non-tender without masses, organomegaly or hernias noted. Extremities:  No clubbing, cyanosis, edema, or deformity noted  Cervical Nodes:  No lymphadenopathy noted Psych:  Cognition and judgment appear intact. Alert and cooperative with normal attention span and concentration. No apparent delusions, illusions, hallucinations   Impression & Recommendations:  Problem # 1:  ACUTE PHARYNGITIS (ICD-462)  His updated medication list for this problem includes:    Zithromax 250 Mg Tabs (Azithromycin) .Marland Kitchen... 2 tabs by mouth once and then 1 tab by mouth once daily x 4 days Push clear fluids, Mucinex two times a day x 10 days  Problem # 2:  HYPERTENSION (ICD-401.9)  His updated medication list for this problem  includes:    Lisinopril 20 Mg Tabs (Lisinopril) .Marland Kitchen... 1 each am for blood pressure Only mild elevation with acute illness no change in therapy today  Complete Medication List: 1)  Lisinopril 20 Mg Tabs (Lisinopril) .Marland Kitchen.. 1 each am for blood pressure 2)  Vitamin D (ergocalciferol) 50000  Unit Caps (Ergocalciferol) .Marland KitchenMarland KitchenMarland Kitchen 1 weekly x 12 3)  Daily Multiple Vitamins Tabs (Multiple vitamin) .... Once daily 4)  Zithromax 250 Mg Tabs (Azithromycin) .... 2 tabs by mouth once and then 1 tab by mouth once daily x 4 days 5)  Tessalon Perles 100 Mg Caps (Benzonatate) .Marland Kitchen.. 1 tab by mouth three times a day as needed  for cough  Patient Instructions: 1)  Take your antibiotic as prescribed until ALL of it is gone, but stop if you develop a rash or swelling and contact our office as soon as possible.  2)  Acute Bronchitis symptoms for less then 10 days are not  helped by antibiotics. Take over the counter cough medications. Call if no improvement in 5-7 days, sooner if increasing cough, fever, or new symptoms ( shortness of breath, chest pain) .  3)  Mucinex 677m 1 tab by mouth two times a day x 10 days  Prescriptions: TESSALON PERLES 100 MG CAPS (BENZONATATE) 1 tab by mouth three times a day as needed  for cough  #30 x 0   Entered and Authorized by:   SPenni HomansMD   Signed by:   SPenni HomansMD on 12/27/2009   Method used:   Electronically to        [facility]       [address]       [address]  [address]      Ph: [phone]      Fax: [phone]  RxID:   [id number] 250 MG TABS (AZITHROMYCIN) 2 tabs by mouth once and then 1 tab by mouth once daily x 4 days  #6 x 0   Entered and Authorized by:   SPenni HomansMD   Signed by:   SPenni HomansMD on 12/27/2009   Method used:   Electronically to        [facility]       [address]       [address]   [address]      Ph: [phone]      Fax: [phone]  RxID:   [id number]   Orders Added: 1)  Est. Patient Level III [[68032]

## 2010-04-07 NOTE — Progress Notes (Signed)
Summary: referral to neurosurgein   Phone Note Call from Patient Call back at [phone]   Caller: Patient Summary of Call: pt called to say had not been seen by neurosurgeion before and to call this number [phone] Initial call taken by: Levora Angel, RN,  Jul 30, 2008 2:56 PM  Follow-up for Phone Call        referral to terri Follow-up by: Levora Angel, RN,  Jul 30, 2008 2:56 PM

## 2010-04-07 NOTE — Progress Notes (Signed)
Summary: patient requesting med change.  Phone Note Call from Patient Call back at [phone] cell   Caller: Patient Call For: Emeterio Reeve MD Summary of Call: Pt said that Dr. Joni Fears switched his pain med from hydrocodone to oxycontin, but pt says they are having too many side effects with the oxycontin, like dry mouth and mood changes. Pt wants Dr Joni Fears to switch him back to the hydrocodone. Please write new script and let the patient know when it is ready. Call pt at [phone] cell or at [phone] wifes # [name].  Initial call taken by: Braulio Bosch,  Jul 28, 2008 10:14 AM  Follow-up for Phone Call        called by dr Joni Fears  Follow-up by: Levora Angel, RN,  Jul 30, 2008 1:43 PM

## 2010-04-07 NOTE — Progress Notes (Signed)
Summary: mri appt  ---- Converted from flag ---- ---- 07/28/2008 3:41 PM, Karna Christmas Slaugenhaupt wrote: thank you, that is sweet.  Pt is set up for this Thursday.  ---- 07/28/2008 2:53 PM, Levora Angel, RN wrote: thanks you are so good and we are fortunate to have u here with [place].   ---- 07/28/2008 2:45 PM, Karna Christmas Slaugenhaupt wrote: No, I have not gotten that far yet. There are many referrals coming my way.  If you have anything that comes up that needs attention immediately, send me a flag and I will work on it right away. I will work on this one today for you.  ---- 07/28/2008 2:41 PM, Levora Angel, RN wrote: Angelique Blonder see mri appt yet is this correct  wife wants to know ------------------------------

## 2010-04-08 NOTE — Consult Note (Signed)
Summary: [facility]  [facility]   Imported By: Laural Benes 09/09/2008 12:39:52  _____________________________________________________________________  External Attachment:    Type:   Image     Comment:   External Document

## 2010-05-30 LAB — CBC
HCT: 40.8 % (ref 39.0–52.0)
Hemoglobin: 12.9 g/dL — ABNORMAL LOW (ref 13.0–17.0)
MCHC: 31.7 g/dL (ref 30.0–36.0)
MCV: 95.7 fL (ref 78.0–100.0)
Platelets: 214 10*3/uL (ref 150–400)
RBC: 4.26 MIL/uL (ref 4.22–5.81)
RDW: 12.5 % (ref 11.5–15.5)
WBC: 7.6 10*3/uL (ref 4.0–10.5)

## 2010-05-30 LAB — BLOOD GAS, ARTERIAL
Acid-base deficit: 5.5 mmol/L — ABNORMAL HIGH (ref 0.0–2.0)
Bicarbonate: 20.7 mEq/L (ref 20.0–24.0)
Drawn by: 324021
FIO2: 0.21 %
O2 Saturation: 90.3 %
Patient temperature: 98.6
TCO2: 18.9 mmol/L (ref 0–100)
pCO2 arterial: 45.3 mmHg — ABNORMAL HIGH (ref 35.0–45.0)
pH, Arterial: 7.282 — ABNORMAL LOW (ref 7.350–7.450)
pO2, Arterial: 66.5 mmHg — ABNORMAL LOW (ref 80.0–100.0)

## 2010-05-30 LAB — COMPREHENSIVE METABOLIC PANEL
ALT: 46 U/L (ref 0–53)
AST: 29 U/L (ref 0–37)
Albumin: 3.5 g/dL (ref 3.5–5.2)
Alkaline Phosphatase: 61 U/L (ref 39–117)
BUN: 13 mg/dL (ref 6–23)
CO2: 25 mEq/L (ref 19–32)
Calcium: 8.1 mg/dL — ABNORMAL LOW (ref 8.4–10.5)
Chloride: 111 mEq/L (ref 96–112)
Creatinine, Ser: 0.7 mg/dL (ref 0.4–1.5)
GFR calc Af Amer: >60 mL/min (ref >60)
GFR calc non Af Amer: >60 mL/min (ref >60)
Glucose, Bld: 88 mg/dL (ref 70–99)
Potassium: 3.5 mEq/L (ref 3.5–5.1)
Sodium: 140 mEq/L (ref 135–145)
Total Bilirubin: 0.7 mg/dL (ref 0.3–1.2)
Total Protein: 6 g/dL (ref 6.0–8.3)

## 2010-06-13 LAB — BASIC METABOLIC PANEL
BUN: 9 mg/dL (ref 6–23)
CO2: 27 mEq/L (ref 19–32)
Calcium: 8.8 mg/dL (ref 8.4–10.5)
Chloride: 103 mEq/L (ref 96–112)
Creatinine, Ser: 0.68 mg/dL (ref 0.4–1.5)
GFR calc Af Amer: >60 mL/min (ref >60)
GFR calc non Af Amer: >60 mL/min (ref >60)
Glucose, Bld: 88 mg/dL (ref 70–99)
Potassium: 4.8 mEq/L (ref 3.5–5.1)
Sodium: 136 mEq/L (ref 135–145)

## 2010-06-13 LAB — CBC
HCT: 40.4 % (ref 39.0–52.0)
Hemoglobin: 13.5 g/dL (ref 13.0–17.0)
MCHC: 33.5 g/dL (ref 30.0–36.0)
MCV: 94.3 fL (ref 78.0–100.0)
Platelets: 201 10*3/uL (ref 150–400)
RBC: 4.28 MIL/uL (ref 4.22–5.81)
RDW: 12.9 % (ref 11.5–15.5)
WBC: 5.6 10*3/uL (ref 4.0–10.5)

## 2010-06-13 LAB — ABO/RH: ABO/RH(D): A POS

## 2010-06-13 LAB — DIFFERENTIAL
Basophils Absolute: 0 10*3/uL (ref 0.0–0.1)
Basophils Relative: 1 % (ref 0–1)
Eosinophils Absolute: 0.1 10*3/uL (ref 0.0–0.7)
Eosinophils Relative: 2 % (ref 0–5)
Lymphocytes Relative: 32 % (ref 12–46)
Lymphs Abs: 1.8 10*3/uL (ref 0.7–4.0)
Monocytes Absolute: 0.5 10*3/uL (ref 0.1–1.0)
Monocytes Relative: 9 % (ref 3–12)
Neutro Abs: 3.1 10*3/uL (ref 1.7–7.7)
Neutrophils Relative %: 57 % (ref 43–77)

## 2010-06-13 LAB — TYPE AND SCREEN
ABO/RH(D): A POS
Antibody Screen: NEGATIVE

## 2010-07-19 NOTE — Op Note (Signed)
Bradley [name], Bradley [name]               ACCOUNT NO.:  [id number]   MEDICAL RECORD NO.:  [id number]          PATIENT TYPE:  OIB   LOCATION:  78                         FACILITY:  [facility]   PHYSICIAN:  Cooper Render. Pool, M.D.    DATE OF BIRTH:  Feb 04, 1971   DATE OF PROCEDURE:  08/24/2008  DATE OF DISCHARGE:                               OPERATIVE REPORT   PREOPERATIVE DIAGNOSIS:  Left T12-L1 herniated nucleus pulposus with  myelopathy.   POSTOPERATIVE DIAGNOSIS:  Left T12-L1 herniated nucleus pulposus with  myelopathy.   PROCEDURE NOTE:  Left T12-L1 transpedicular microdiskectomy.   SURGEON:  Cooper Render. Pool, MD   ASSISTANT:  Kary Kos, MD   ANESTHESIA:  General endotracheal.   INDICATIONS:  Bradley [name] is a 40 year old male with history of chronic  back pain with elements of both radiculopathy and lower thoracic  myelopathy.  Workup demonstrates evidence of a large left paracentral  disk herniation with marked compression upon the thecal sac, conus  medullaris, and upper cauda equina.  The patient has been counseled as  to his options.  He decided to proceed with a T12-L1 transpedicular  microdiskectomy in the hopes of improving symptoms.   OPERATIVE NOTE:  The patient was brought to the operative room and  placed on the operating table in the supine position.  After adequate  level of anesthesia was achieved, the patient was positioned prone on a  Wilson frame and appropriately padded.  The patient's lumbar region was  prepped and draped sterilely.  A 10 blade was used to make a curvilinear  skin incision overlying the T12-L1 interspace.  This was carried down  sharply in the midline.  A subperiosteal dissection was then performed  exposing the lamina and facet joints of T12 and L1.  Deep self-retaining  retractor was placed.  Intraoperative x-ray was taken and level was  confirmed.  Laminotomy was then performed using high-speed drill and  Kerrison rongeurs to remove the inferior  aspect of lamina of T12 and  medial aspect of T12-L1 facet joint, superior rim of the L1 lamina, and  the superomedial aspect of the L1 pedicle.  Epidural venous plexus was  coagulated and cut.  Microscope was then brought into the field and used  for microdissection.  Disk herniation was readily apparent.  Working  lateral to thecal sac, the disk space and overlying osteophyte was  dissected free.  An osteophyte remover was used to remove the  overhanging osteophytes emanating from the T12 vertebral body.  Disk  space was then incised with a 15 blade in a rectangular fashion.  Wide  disk space clean-out was then achieved using pituitary rongeurs, upward-  angled pituitary rongeurs, and Epstein curettes.  All elements of the  disk herniation were completely resected.  All loose or obviously  degenerative disk material was then removed from the interspace.  At  this point, a very thorough diskectomy was then achieved.  There was no  evidence of injury to thecal sac or nerve roots.  There was no  significant retraction upon thecal sac or nerve roots throughout the  whole case.  At this point, a very thorough decompression was achieved.  The wound was irrigated with antibiotic solution.  Gelfoam was placed  topically for hemostasis and found to be good.  Microscope and retractor  system were removed.  Hemostasis was then achieved with electrocautery.  Wounds was closed in  layers with Vicryl sutures.  Steri-Strips and sterile dressing were  applied.  There were no complications.  The patient tolerated the  procedure well and he returned to the recovery room postoperatively.           ______________________________  Cooper Render Pool, M.D.     HAP/MEDQ  D:  08/24/2008  T:  08/25/2008  Job:  [id number]

## 2010-07-22 NOTE — Op Note (Signed)
[name], Bradley [name]               ACCOUNT NO.:  [id number]   MEDICAL RECORD NO.:  [id number]          PATIENT TYPE:  INP   LOCATION:  0240                         FACILITY:  [facility]   PHYSICIAN:  Marily Memos, MD      DATE OF BIRTH:  1970-10-19   DATE OF PROCEDURE:  07/08/2004  DATE OF DISCHARGE:                                 OPERATIVE REPORT   PREOPERATIVE DIAGNOSES:  Infection open wound right great toe.   POSTOPERATIVE DIAGNOSES:  Infection open wound right great toe.   ANESTHESIA:  Ankle block.   PROCEDURE:  Irrigation, debridement and partial wound closure right great  toe.   The patient was taken to the operating room after given adequate preop  medication. An ankle block was given, right foot was scrubbed with Betadine  scrub and painted with Betadine solution, draped in a sterile manner. Skin  debridement was done as well as inside the wound. The wound itself had good  pink healthy looking tissue. Extensor hallicis was exposed. Irrigation was  then done. After adequate debridement and irrigation, wound closure was then  done with #1 nylon. A small 5 mm area was not able to be closed with skin  defect and this was left open. Compressive dressing was applied. The patient  tolerated the procedure quite well and went to the recovery room in stable  and satisfactory condition.      AC/MEDQ  D:  07/08/2004  T:  07/08/2004  Job:  [id number]

## 2010-07-22 NOTE — Op Note (Signed)
NAMECESAREO, VICKREY               ACCOUNT NO.:  [id number]   MEDICAL RECORD NO.:  [id number]          PATIENT TYPE:  INP   LOCATION:  6122                         FACILITY:  [facility]   PHYSICIAN:  Marily Memos, MD      DATE OF BIRTH:  01/28/1971   DATE OF PROCEDURE:  07/06/2004  DATE OF DISCHARGE:                                 OPERATIVE REPORT   PREOPERATIVE DIAGNOSIS:  Abscess infection right great toe.   DISCHARGE DIAGNOSIS:  Abscess infection right great toe.   ANESTHESIA:  Local block right great toe.   PROCEDURE:  Incision and drainage and packing abscess right great toe.   The patient was taken to the operating room, where he was given adequate  digital block to the right great toe.  Right foot was scrubbed with Betadine  scrub and painted with Betadine solution and draped in a sterile manner.  Tourniquet was not used.  Abscess of the right great joint was incised and  debrided.  Abundant purulent material was evacuated.  Skin edges were  debrided deeply all the way further proximally, covering the distal half of  the first metatarsal into the first web space.  Copious irrigation with  Simpulse and antibiotic solution was done.  After adequate and thorough  debridement of the area, extensor hallucis was then sewn intact.  Wound was  then packed with 1/2 inch iodoform gauze.  Compressive dressing was applied.  The patient tolerated the procedure quite well and went to the recovery room  in stable and satisfactory condition.      AC/MEDQ  D:  07/06/2004  T:  07/06/2004  Job:  [id number]

## 2010-07-22 NOTE — Consult Note (Signed)
[name], [name]               ACCOUNT NO.:  [id number]   MEDICAL RECORD NO.:  [id number]          PATIENT TYPE:  INP   LOCATION:  4481                         [facility]ACILITY:  [facility]   PHYSICIAN:  Marily Memos, MD      DATE OF BIRTH:  1970-09-17   DATE OF CONSULTATION:  07/06/2004  DATE OF DISCHARGE:  07/10/2004                                   CONSULTATION   REQUESTING PHYSICIAN:  Sheila Oats, M.D.   REASON FOR CONSULTATION:  Cellulitis and abscess, right great toe.   PERTINENT HISTORY:  This is a 40 year old morbidly obese male who is  admitted to the hospital because of pain, swelling, and drainage from his  right great toe.  The patient noted that about six days ago prior to  admission, he noted a bump on his right great toe.  He was not sure whether  he had traumatized it, but the swelling got worse with pain.  He went to  [facility] two days later and was diagnosed with gout.  Started on prednisone and hydrocodone.  The patient's swelling progressively  worsened.  The patient subsequently presented to [facility] emergency room  with multiple worsening symptoms.   PAST MEDICAL HISTORY:  Obesity.  Possible gout.   ALLERGIES:  CODEINE.   SOCIAL HISTORY:  Denies the use of tobacco or alcohol.   FAMILY HISTORY:  Cancer and high blood pressure.   MEDICATIONS:  The patient is on Unasyn IV, antibiotic.   REVIEW OF SYSTEMS:  As in the history of present illness.  No cardiac,  respectively, urinary, or bowel.   PHYSICAL EXAMINATION:  VITAL SIGNS:  Blood pressure 198/91, pulse 94,  respirations 20, O2 sat 92%.  Temperature 100.  GENERAL:  Alert and oriented.  No acute distress.  Obese individual.  EXTREMITIES:  Increased warmth, redness.  Serous drainage.  Markedly tender  to palpation.  Dorsalis pedis is intact.   White cell count 14,000.  Hemoglobin and hematocrit normal.  Differential:  79 polys, 11% neutrophils.  Blood sugar 117.  Blood cultures showed  no  growth.  Gram's stain:  Moderate gram-positive cocci.  Wound culture shows  MRSA.   X-ray shows soft tissue swelling.  No evidence of bony involvement of the  right foot.   IMPRESSION:  Cellulitis, abscess, right foot.  Possibly secondary to initial  gouty flare-up.   PLAN:  I&D, right great toe.       AC/MEDQ  D:  08/31/2004  T:  08/31/2004  Job:  [id number]

## 2010-07-22 NOTE — Discharge Summary (Signed)
Bradley [name], Bradley [name]               ACCOUNT NO.:  [id number]   MEDICAL RECORD NO.:  [id number]          PATIENT TYPE:  INP   LOCATION:  [place]                         FACILITY:  [facility]   PHYSICIAN:  Corinna L. Conley Canal, MDDATE OF BIRTH:  [birth year]   DATE OF ADMISSION:  07/03/2004  DATE OF DISCHARGE:                                 DISCHARGE SUMMARY   DIAGNOSES:  1.  Methicillin-resistant Staphylococcus aureus abscess of the right great      toe.  2.  Status post incision and drainage of said abscess with subsequent      partial closure on Jul 08, 2004.  3.  Probable obstructive sleep apnea, needs outpatient polysomnogram.  4.  Morbid obesity.   DISCHARGE MEDICATIONS:  1.  Doxycycline 100 mg p.o. b.i.d. until gone to complete a total course of      antibiotics of two weeks.  2.  Demerol 50 mg q.4h. p.r.n. pain.  3.  Compazine 10 mg q.6h. p.r.n. nausea.   ACTIVITY:  No driving, partial weightbearing to the right foot until follow  up with Dr. Eulas Post.   FOLLOWUP:  With Dr. Eulas Post next week. He is to call for an appointment. He  is to follow up with the primary care physician of his choice to work up  sleep apnea as an outpatient.   WOUND CARE:  He is to keep his right foot clean and dry and change dressing  daily.   CONDITION:  Stable.   CONSULTATION:  Marily Memos, MD   PROCEDURE:  Incision and drainage of right toe abscess and subsequent  partial closure.   PERTINENT LABORATORY:  Blood culture is negative. Wound culture grew out  methicillin-resistant Staphylococcus aureus which was sensitive to  gentamycin, rifampin, tetracycline, Bactrim, and vancomycin. Intraoperative  culture grew out the same strain of MRSA. Initial white blood cell count was  13,000 with 84% neutrophils, 12% lymphocytes. The rest of the CBC was  unremarkable. The erythrocyte sedimentation rate was 24. Basic metabolic  panel unremarkable.   SPECIAL STUDIES AND RADIOLOGY:  EKG showed normal sinus  rhythm. X-ray of the  foot showed distal soft tissue swelling. MRI of the foot showed extensive  fluid loculated fluid collection within the soft tissues along the dorsum of  the great toe extending from the level of the distal metatarsal distally to  the mid aspect of the distal phalanx, with a joint effusion about the MTP  joint which may be infectious. No osseous changes identified.   HISTORY AND HOSPITAL COURSE:  Bradley [name] is a 40 year old unassigned white  male who had had right great toe pain and swelling for several days. He had  not noted any trauma to the area.  He had been seen in [facility] several days prior to the admission, started on prednisone and  hydrocodone for a possible gout.  His uric acid level was elevated there  apparently. He got worse and was given colchicine by urgent care. He  continued to worsen and presented to the emergency room. His temperature was  normal, blood pressure 165/99. His vital signs  were otherwise normal. He had  morbid obesity and a markedly edematous, erythematous, and tender right  great toe extending proximally on the dorsum of the foot. He had some  serosanguineous drainage. The drainage increased and started draining  purulent material. MRI was consistent with abscess and orthopedics were  consulted. Dr. Eulas Post performed incision and drainage of the abscess on Jul 06, 2004.  It was packed with Iodoform gauze and subsequently partially  closed on Jul 08, 2004. He was started initially on Unasyn, vancomycin was  added, and the culture grew out methicillin-resistant Staphylococcus aureus  and the Unasyn was stopped. At the time of discharge the cellulitis and  drainage had improved tremendously as had his pain. The patient has been  partial weightbearing with a walker and is being sent home with a walker.   The patient experienced nausea with Percocet and oral Dilaudid. He reports  that he has had Demerol orally in the past  without difficulty. He is  instructed to call or return to the emergency room with worsening signs of  infection.   Total time on the day of discharge is 35 minutes.      CLS/MEDQ  D:  07/10/2004  T:  07/10/2004  Job:  [id number]   cc:   Marily Memos, MD  [place]  [place] [address]  Fax: [phone]

## 2010-07-22 NOTE — H&P (Signed)
Bradley [name], Bradley [name]               ACCOUNT NO.:  [id number]   MEDICAL RECORD NO.:  [id number]          PATIENT TYPE:  INP   LOCATION:  5409                         [facility]ACILITY:  [facility]   PHYSICIAN:  Sheila Oats, M.D.DATE OF BIRTH:  1970/05/02   DATE OF ADMISSION:  07/03/2004  DATE OF DISCHARGE:                                HISTORY & PHYSICAL   PRIMARY CARE PHYSICIAN:  Unassigned.   CHIEF COMPLAINT:  Right great toe pain and swelling.   HISTORY OF PRESENT ILLNESS:  The patient is a 40 year old morbidly obese  white male who presents with complaints of pain, swelling and drainage from  his right great toe. He states that about six days ago he first noticed a  bump on his right great toe and he was not sure if he had hit his foot  against something or if he had sustained an insect bite. The swelling got  worse and the pain as well so he went to [facility] two days  later and was diagnosed with gout and started on prednisone and hydrocodone.  Per the patient, he was told that his uric acid level was about 8.5.  The  swelling continued to get worse and then an area on the top of his toe  popped open and began to drain and the pain continued to get worse and so he  called back to the [facility] at which colchicine was added  and also his prednisone dose was increased--this was two days ago. The  patient denies fever, nausea, vomiting, dysuria, cough, melena, and no  hematochezia.   The patient presented to the ER because of his worsening symptoms and he had  a white cell count done which was elevated. Plain films of his right foot  showed soft tissue swelling but no findings specific for gout and also no  __________. He was admitted to the [facility] hospitalist service for further  evaluation and management.   PAST MEDICAL HISTORY:  As above, ? gout.   MEDICATIONS:  1.  Vicodin.  2.  Prednisone 10 mg.  3.  Colchicine 0.6 mg b.i.d.   ALLERGIES:   CODEINE.   SOCIAL HISTORY:  Denies tobacco, also denies alcohol.   FAMILY HISTORY:  His grandfather had cancer-type unknown, both of his  parents have hypertension.   REVIEW OF SYMPTOMS:  As per HPI, __________ review of systems negative.   PHYSICAL EXAMINATION:  GENERAL:  The patient is a morbidly obese, white  male, who looks older than stated age, in no acute respiratory distress. He  is uncomfortable secondary to pain in toe.  VITAL SIGNS:  Temperature 97.2, blood pressure 165/99, pulse 72, respiratory  rate 20, O2 sat 98%.  HEENT:  PERRL, EOMI, sclera anicteric, moist mucous membranes, no oral  exudates.  NECK:  Supple, no adenopathy, no thyromegaly.  LUNGS:  Clear to auscultation bilaterally. No crackles or wheezes  appreciated.  CARDIOVASCULAR:  Regular rate and rhythm, normal S1, S2, no murmurs or  gallops appreciated.  ABDOMEN:  Obese, bowel sounds present, nontender, nondistended, nor  organomegaly and no masses  palpable.  EXTREMITIES:  The right great toe is markedly edematous, erythema present,  has a linear opening on the dorsum of the toe draining serosanguineous  fluid. There is tenderness and warmth. He has +2 pulses present bilaterally  and the left foot is within normal limits.  NEUROLOGIC:  Alert and oriented x3. Cranial nerves II-XII grossly intact,  nonfocal exam.   LABORATORY DATA:  His right foot x-ray shows distal soft tissue swelling, no  findings specific for gout. His white cell count was 13.4, hemoglobin is  15.6, hematocrit 46.7, platelet count is 318 and neutrophil count is 84%.  Sodium is 137 with a potassium of 4.4, chloride 100, CO2 31, glucose 117,  BUN 13, creatinine 0.8 with a calcium of 8.8. His uric acid is 5.3, his sed  rate is 24.   ASSESSMENT:  1.  Right foot cellulitis--will obtain blood cultures, continue Unasyn. Will      obtain an MRI in the a.m. to evaluate for osteomyelitis. Pain management      and follow.  2.  ? gout-will  continue colchicine. Will discontinue prednisone at this      time.  3.  Morbid obesity.      ACV/MEDQ  D:  07/04/2004  T:  07/04/2004  Job:  [id number]

## 2010-12-19 ENCOUNTER — Emergency Department (HOSPITAL_COMMUNITY): Payer: 59

## 2010-12-19 ENCOUNTER — Emergency Department (HOSPITAL_COMMUNITY)
Admission: EM | Admit: 2010-12-19 | Discharge: 2010-12-19 | Disposition: A | Discharge status: Home / Self Care | Payer: 59 | Attending: Emergency Medicine | Admitting: Emergency Medicine

## 2010-12-19 DIAGNOSIS — IMO0002 Reserved for concepts with insufficient information to code with codable children: Secondary | ICD-10-CM | POA: Insufficient documentation

## 2010-12-19 DIAGNOSIS — Z79899 Other long term (current) drug therapy: Secondary | ICD-10-CM | POA: Insufficient documentation

## 2010-12-19 DIAGNOSIS — M25519 Pain in unspecified shoulder: Secondary | ICD-10-CM | POA: Insufficient documentation

## 2010-12-19 DIAGNOSIS — I10 Essential (primary) hypertension: Secondary | ICD-10-CM | POA: Insufficient documentation

## 2010-12-19 DIAGNOSIS — S27329A Contusion of lung, unspecified, initial encounter: Secondary | ICD-10-CM | POA: Insufficient documentation

## 2010-12-19 DIAGNOSIS — M549 Dorsalgia, unspecified: Secondary | ICD-10-CM | POA: Insufficient documentation

## 2011-01-09 ENCOUNTER — Other Ambulatory Visit (HOSPITAL_COMMUNITY): Payer: Self-pay | Admitting: Orthopedic Surgery

## 2011-01-09 DIAGNOSIS — M25511 Pain in right shoulder: Secondary | ICD-10-CM

## 2011-01-11 ENCOUNTER — Ambulatory Visit (HOSPITAL_COMMUNITY)
Admission: RE | Admit: 2011-01-11 | Discharge: 2011-01-11 | Disposition: A | Discharge status: Home / Self Care | Payer: 59 | Source: Ambulatory Visit | Attending: Orthopedic Surgery | Admitting: Orthopedic Surgery

## 2011-01-11 ENCOUNTER — Other Ambulatory Visit (HOSPITAL_COMMUNITY): Payer: Self-pay | Admitting: Orthopedic Surgery

## 2011-01-11 DIAGNOSIS — M25511 Pain in right shoulder: Secondary | ICD-10-CM

## 2011-01-11 DIAGNOSIS — Z1389 Encounter for screening for other disorder: Secondary | ICD-10-CM | POA: Insufficient documentation

## 2011-01-11 DIAGNOSIS — M25519 Pain in unspecified shoulder: Secondary | ICD-10-CM | POA: Insufficient documentation

## 2011-01-11 DIAGNOSIS — S43429A Sprain of unspecified rotator cuff capsule, initial encounter: Secondary | ICD-10-CM | POA: Insufficient documentation

## 2011-02-06 ENCOUNTER — Emergency Department (HOSPITAL_COMMUNITY)
Admission: EM | Admit: 2011-02-06 | Discharge: 2011-02-06 | Disposition: A | Discharge status: Home / Self Care | Payer: 59 | Source: Home / Self Care | Attending: Emergency Medicine | Admitting: Emergency Medicine

## 2011-02-06 DIAGNOSIS — R6889 Other general symptoms and signs: Secondary | ICD-10-CM

## 2011-02-06 DIAGNOSIS — J111 Influenza due to unidentified influenza virus with other respiratory manifestations: Secondary | ICD-10-CM

## 2011-02-06 HISTORY — DX: Essential (primary) hypertension: I10

## 2011-02-06 MED ORDER — HYDROCODONE-HOMATROPINE 5-1.5 MG/5ML PO SYRP: 5.0000 mL | ORAL_SOLUTION | Freq: Four times a day (QID) | ORAL | Status: AC | PRN

## 2011-02-06 MED ORDER — ALBUTEROL SULFATE HFA 108 (90 BASE) MCG/ACT IN AERS: 1.0000 | INHALATION_SPRAY | Freq: Four times a day (QID) | RESPIRATORY_TRACT | Status: DC | PRN

## 2011-02-06 MED ORDER — IBUPROFEN 600 MG PO TABS: 600.0000 mg | ORAL_TABLET | Freq: Four times a day (QID) | ORAL | Status: AC | PRN

## 2011-02-06 MED ORDER — GUAIFENESIN ER 600 MG PO TB12: 600.0000 mg | ORAL_TABLET | Freq: Two times a day (BID) | ORAL | Status: DC

## 2011-02-06 MED ORDER — FLUTICASONE PROPIONATE 50 MCG/ACT NA SUSP: 2.0000 | Freq: Every day | NASAL | Status: DC

## 2011-02-06 NOTE — ED Notes (Signed)
C/o he has been ill for past 3 days, fever, cough; has to brace himself since he is sore w coughing, also c/o HA

## 2011-02-06 NOTE — ED Provider Notes (Signed)
History     CSN: [id number] Arrival date & time: 02/06/2011  3:56 PM   First MD Initiated Contact with Patient 02/06/11 1603      Chief Complaint  Patient presents with  . Fever    HPI Comments: Pt with rhinorrhea, postnasal drip, ST, nonproductive cough, fatigue. bodyaches x 3 days. States feels "hot" but no measured fevers at home. Unable to sleep at night secondary to coughing. Occ wheezing especially at night. Achy CP and upper abd pain after coughing. No ear pain, abd pain, SOB,  rash, N/V. Slightly decreased appetite but is tolerating po.        Patient is a 40 y.o. male presenting with fever.  Fever Primary symptoms of the febrile illness include fatigue, headaches, cough and wheezing. Primary symptoms do not include shortness of breath, abdominal pain, nausea, vomiting, diarrhea or rash. The current episode started 3 to 5 days ago. This is a new problem. The problem has not changed since onset.   Past Medical History  Diagnosis Date  . Hypertension   . Kidney stone     Past Surgical History  Procedure Date  . Gastric bypass   . Back surgery     History reviewed. No pertinent family history.  History  Substance Use Topics  . Smoking status: Never Smoker   . Smokeless tobacco: Not on file  . Alcohol Use: No      Review of Systems  Constitutional: Positive for fatigue.  HENT: Positive for sore throat, rhinorrhea, sneezing and postnasal drip. Negative for ear pain, trouble swallowing and voice change.   Respiratory: Positive for cough and wheezing. Negative for chest tightness and shortness of breath.   Cardiovascular: Negative for chest pain.  Gastrointestinal: Negative for nausea, vomiting, abdominal pain and diarrhea.  Skin: Negative for rash.  Neurological: Positive for headaches.    Allergies  Review of patient's allergies indicates no known allergies.  Home Medications   Current Outpatient Rx  Name Route Sig Dispense Refill  . LISINOPRIL 20 MG  PO TABS Oral Take 20 mg by mouth daily.      . ALBUTEROL SULFATE HFA 108 (90 BASE) MCG/ACT IN AERS Inhalation Inhale 1-2 puffs into the lungs every 6 (six) hours as needed for wheezing. 1 Inhaler 0  . FLUTICASONE PROPIONATE 50 MCG/ACT NA SUSP Nasal Place 2 sprays into the nose daily. 16 g 2  . GUAIFENESIN ER 600 MG PO TB12 Oral Take 1 tablet (600 mg total) by mouth 2 (two) times daily. 14 tablet 0  . HYDROCODONE-HOMATROPINE 5-1.5 MG/5ML PO SYRP Oral Take 5 mLs by mouth every 6 (six) hours as needed for cough or pain. 120 mL 0  . IBUPROFEN 600 MG PO TABS Oral Take 1 tablet (600 mg total) by mouth every 6 (six) hours as needed for pain. 30 tablet 0    BP 151/92  Pulse 70  Temp(Src) 98.3 F (36.8 C) (Oral)  Resp 20  SpO2 100%  Physical Exam  Nursing note and vitals reviewed. Constitutional: He is oriented to person, place, and time. He appears well-developed and well-nourished.  HENT:  Head: Normocephalic and atraumatic.  Right Ear: Hearing, tympanic membrane and ear canal normal.  Left Ear: Hearing, tympanic membrane and ear canal normal.  Nose: Mucosal edema and rhinorrhea present. No epistaxis.  Mouth/Throat: Uvula is midline and mucous membranes are normal. Posterior oropharyngeal erythema present. No oropharyngeal exudate.       No sinus tenderness  Eyes: Conjunctivae and EOM are normal. Pupils  are equal, round, and reactive to light.  Neck: Normal range of motion. Neck supple.  Cardiovascular: Normal rate, regular rhythm and normal heart sounds.   Pulmonary/Chest: Effort normal and breath sounds normal. No respiratory distress. He has no rales.       occ wheeze clears with coughing. Diffuse chest tenderness  Abdominal: Soft. Bowel sounds are normal. He exhibits no distension. There is no tenderness.  Musculoskeletal: Normal range of motion. He exhibits no edema and no tenderness.  Lymphadenopathy:    He has no cervical adenopathy.  Neurological: He is alert and oriented to  person, place, and time.  Skin: Skin is warm and dry. No rash noted.  Psychiatric: He has a normal mood and affect. His behavior is normal. Judgment and thought content normal.    ED Course  Procedures (including critical care time)  Labs Reviewed - No data to display No results found.   1. Flu-like symptoms    MDM     Cherly Beach, MD 02/08/11 [phone]

## 2011-03-20 ENCOUNTER — Other Ambulatory Visit: Payer: Self-pay | Admitting: Orthopedic Surgery

## 2011-03-29 ENCOUNTER — Encounter (HOSPITAL_COMMUNITY): Payer: Self-pay

## 2011-03-29 ENCOUNTER — Emergency Department (HOSPITAL_COMMUNITY)
Admission: EM | Admit: 2011-03-29 | Discharge: 2011-03-29 | Disposition: A | Discharge status: Home / Self Care | Payer: No Typology Code available for payment source | Attending: Emergency Medicine | Admitting: Emergency Medicine

## 2011-03-29 DIAGNOSIS — IMO0002 Reserved for concepts with insufficient information to code with codable children: Secondary | ICD-10-CM | POA: Insufficient documentation

## 2011-03-29 DIAGNOSIS — I10 Essential (primary) hypertension: Secondary | ICD-10-CM | POA: Insufficient documentation

## 2011-03-29 DIAGNOSIS — Z79899 Other long term (current) drug therapy: Secondary | ICD-10-CM | POA: Insufficient documentation

## 2011-03-29 DIAGNOSIS — M542 Cervicalgia: Secondary | ICD-10-CM | POA: Insufficient documentation

## 2011-03-29 DIAGNOSIS — M25519 Pain in unspecified shoulder: Secondary | ICD-10-CM | POA: Insufficient documentation

## 2011-03-29 DIAGNOSIS — S139XXA Sprain of joints and ligaments of unspecified parts of neck, initial encounter: Secondary | ICD-10-CM | POA: Insufficient documentation

## 2011-03-29 DIAGNOSIS — S43401A Unspecified sprain of right shoulder joint, initial encounter: Secondary | ICD-10-CM

## 2011-03-29 DIAGNOSIS — S161XXA Strain of muscle, fascia and tendon at neck level, initial encounter: Secondary | ICD-10-CM

## 2011-03-29 MED ORDER — DIAZEPAM 5 MG PO TABS: 5.0000 mg | ORAL_TABLET | Freq: Two times a day (BID) | ORAL | Status: AC

## 2011-03-29 NOTE — Progress Notes (Signed)
Orthopedic Tech Progress Note Patient Details:  Bradley [name] Feb 25, 1971 [id number]  Other Ortho Devices Type of Ortho Device: Other (comment) Ortho Device Location: sling immobilizer Ortho Device Interventions: Application   Madelaine Etienne 03/29/2011, 8:58 AM
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
## 2011-03-29 NOTE — ED Provider Notes (Signed)
Medical screening examination/treatment/procedure(s) were performed by non-physician practitioner and as supervising physician I was immediately available for consultation/collaboration.  Virgel Manifold, MD 03/29/11 [phone]
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
## 2011-05-30 ENCOUNTER — Encounter (HOSPITAL_COMMUNITY): Admission: RE | Payer: Self-pay | Source: Ambulatory Visit

## 2011-05-30 ENCOUNTER — Inpatient Hospital Stay (HOSPITAL_COMMUNITY): Admission: RE | Admit: 2011-05-30 | Payer: 59 | Source: Ambulatory Visit | Admitting: Orthopedic Surgery

## 2011-05-30 SURGERY — ARTHROPLASTY, KNEE, TOTAL
Anesthesia: General | Site: Knee | Laterality: Right

## 2011-06-12 ENCOUNTER — Encounter (HOSPITAL_COMMUNITY): Payer: Self-pay

## 2011-06-12 ENCOUNTER — Emergency Department (INDEPENDENT_AMBULATORY_CARE_PROVIDER_SITE_OTHER)
Admission: EM | Admit: 2011-06-12 | Discharge: 2011-06-12 | Disposition: A | Discharge status: Home / Self Care | Payer: 59 | Source: Home / Self Care | Attending: Emergency Medicine | Admitting: Emergency Medicine

## 2011-06-12 DIAGNOSIS — J069 Acute upper respiratory infection, unspecified: Secondary | ICD-10-CM

## 2011-06-12 MED ORDER — ALBUTEROL SULFATE HFA 108 (90 BASE) MCG/ACT IN AERS: 1.0000 | INHALATION_SPRAY | Freq: Four times a day (QID) | RESPIRATORY_TRACT | Status: DC | PRN

## 2011-06-12 MED ORDER — CETIRIZINE-PSEUDOEPHEDRINE ER 5-120 MG PO TB12: 1.0000 | ORAL_TABLET | Freq: Every day | ORAL | Status: DC

## 2011-06-12 MED ORDER — BENZONATATE 100 MG PO CAPS: 100.0000 mg | ORAL_CAPSULE | Freq: Three times a day (TID) | ORAL | Status: DC

## 2011-06-12 MED ORDER — BENZONATATE 100 MG PO CAPS: 100.0000 mg | ORAL_CAPSULE | Freq: Three times a day (TID) | ORAL | Status: AC

## 2011-06-12 NOTE — Discharge Instructions (Signed)
    Your exam and symptoms were consistent with an upper respiratory viral process we discussed symptomatic management for the next 4872 hours return if any further concerns or changes.   Antibiotic Nonuse  Your caregiver felt that the infection or problem was not one that would be helped with an antibiotic. Infections may be caused by viruses or bacteria. Only a caregiver can tell which one of these is the likely cause of an illness. A cold is the most common cause of infection in both adults and children. A cold is a virus. Antibiotic treatment will have no effect on a viral infection. Viruses can lead to many lost days of work caring for sick children and many missed days of school. Children may catch as many as 10 "colds" or "flus" per year during which they can be tearful, cranky, and uncomfortable. The goal of treating a virus is aimed at keeping the ill person comfortable. Antibiotics are medications used to help the body fight bacterial infections. There are relatively few types of bacteria that cause infections but there are hundreds of viruses. While both viruses and bacteria cause infection they are very different types of germs. A viral infection will typically go away by itself within 7 to 10 days. Bacterial infections may spread or get worse without antibiotic treatment. Examples of bacterial infections are:  Sore throats (like strep throat or tonsillitis).   Infection in the lung (pneumonia).   Ear and skin infections.  Examples of viral infections are:  Colds or flus.   Most coughs and bronchitis.   Sore throats not caused by Strep.   Runny noses.  It is often best not to take an antibiotic when a viral infection is the cause of the problem. Antibiotics can kill off the helpful bacteria that we have inside our body and allow harmful bacteria to start growing. Antibiotics can cause side effects such as allergies, nausea, and diarrhea without helping to improve the symptoms of  the viral infection. Additionally, repeated uses of antibiotics can cause bacteria inside of our body to become resistant. That resistance can be passed onto harmful bacterial. The next time you have an infection it may be harder to treat if antibiotics are used when they are not needed. Not treating with antibiotics allows our own immune system to develop and take care of infections more efficiently. Also, antibiotics will work better for [place] when they are prescribed for bacterial infections. Treatments for a child that is ill may include:  Give extra fluids throughout the day to stay hydrated.   Get plenty of rest.   Only give your child over-the-counter or prescription medicines for pain, discomfort, or fever as directed by your caregiver.   The use of a cool mist humidifier may help stuffy noses.   Cold medications if suggested by your caregiver.  Your caregiver may decide to start you on an antibiotic if:  The problem you were seen for today continues for a longer length of time than expected.   You develop a secondary bacterial infection.  SEEK MEDICAL CARE IF:  Fever lasts longer than 5 days.   Symptoms continue to get worse after 5 to 7 days or become severe.   Difficulty in breathing develops.   Signs of dehydration develop (poor drinking, rare urinating, dark colored urine).   Changes in behavior or worsening tiredness (listlessness or lethargy).  Document Released: 05/01/2001 Document Revised: 02/09/2011 Document Reviewed: 10/28/2008 [facility] Patient Information 2012 [place].

## 2011-06-12 NOTE — ED Provider Notes (Signed)
History     CSN: [id number]  Arrival date & time 06/12/11  0762   First MD Initiated Contact with Patient 06/12/11 [phone]      Chief Complaint  Patient presents with  . Cough  . URI    (Consider location/radiation/quality/duration/timing/severity/associated sxs/prior treatment) Patient is a 41 y.o. male presenting with cough and URI. The history is provided by the patient.  Cough This is a new (3 days) problem. Episode frequency: intermittent. The problem has not changed since onset.The cough is productive of sputum. There has been no fever. Pertinent negatives include no shortness of breath and no wheezing. He is not a smoker.  URI The primary symptoms include cough. Primary symptoms do not include wheezing.    Past Medical History  Diagnosis Date  . Hypertension   . Kidney stone     Past Surgical History  Procedure Date  . Gastric bypass   . Back surgery   . Ankle surgery   . Knee surgery     No family history on file.  History  Substance Use Topics  . Smoking status: Never Smoker   . Smokeless tobacco: Not on file  . Alcohol Use: No      Review of Systems  Respiratory: Positive for cough. Negative for apnea, shortness of breath and wheezing.   Cardiovascular: Negative for palpitations.    Allergies  Review of patient's allergies indicates no known allergies.  Home Medications   Current Outpatient Rx  Name Route Sig Dispense Refill  . ALBUTEROL SULFATE HFA 108 (90 BASE) MCG/ACT IN AERS Inhalation Inhale 1-2 puffs into the lungs every 6 (six) hours as needed for wheezing. 1 Inhaler 0  . BENZONATATE 100 MG PO CAPS Oral Take 1 capsule (100 mg total) by mouth every 8 (eight) hours. 21 capsule 0  . CETIRIZINE-PSEUDOEPHEDRINE ER 5-120 MG PO TB12 Oral Take 1 tablet by mouth daily. 30 tablet 0  . FLUTICASONE PROPIONATE 50 MCG/ACT NA SUSP Nasal Place 2 sprays into the nose daily. 16 g 2  . LISINOPRIL 20 MG PO TABS Oral Take 20 mg by mouth daily.      Marland Kitchen NAPROXEN  SODIUM 220 MG PO TABS Oral Take 440 mg by mouth 2 (two) times daily as needed. For pain      BP 144/89  Pulse 74  Temp(Src) 98.2 F (36.8 C) (Oral)  Resp 14  SpO2 100%  Physical Exam  Constitutional: He appears well-developed and well-nourished. No distress.  Cardiovascular: Regular rhythm.   Pulmonary/Chest: No respiratory distress. He has no wheezes. He has no rales. He exhibits no tenderness.  Skin: Skin is warm and dry.  Psychiatric: He has a normal mood and affect.    ED Course  Procedures (including critical care time)  Labs Reviewed - No data to display No results found.   1. Upper respiratory infection       MDM  Patient presents to [facility] today with c/o  Cough (yellow mucus), congestion x 3 days, afebrile, reports chest discomfort from coughing, and some SOB will prescribe  Tessalon pearls, refill Albuterol, Zyrtec D, return to PCP or return to [facility] as needed for worsening of symptoms or new developing symptoms return for recheck       Theadore Nan, [place] 06/12/11 3545

## 2011-06-12 NOTE — ED Provider Notes (Signed)
Medical screening examination/treatment/procedure(s) were performed by non-physician practitioner and as supervising physician I was immediately available for consultation/collaboration.Medical screening examination/treatment/procedure(s) were performed by non-physician practitioner and as supervising physician I was immediately available for consultation/collaboration.I have interviewed and examined the patient along with Nurse practitioner student.  Auron Tadros  Burnett Kanaris, MD 06/12/11 1324

## 2011-06-12 NOTE — ED Notes (Signed)
C/o post-nasal drainage, chest congestion, productive cough, body aches and ribs hurt with coughing.  Denies known fever.

## 2011-08-31 ENCOUNTER — Other Ambulatory Visit: Payer: Self-pay | Admitting: Family Medicine

## 2011-11-29 ENCOUNTER — Emergency Department (HOSPITAL_COMMUNITY)
Admission: EM | Admit: 2011-11-29 | Discharge: 2011-11-29 | Disposition: A | Discharge status: Home / Self Care | Payer: 59 | Attending: Emergency Medicine | Admitting: Emergency Medicine

## 2011-11-29 ENCOUNTER — Encounter (HOSPITAL_COMMUNITY): Payer: Self-pay | Admitting: Emergency Medicine

## 2011-11-29 DIAGNOSIS — N4830 Priapism, unspecified: Secondary | ICD-10-CM | POA: Insufficient documentation

## 2011-11-29 DIAGNOSIS — Z87442 Personal history of urinary calculi: Secondary | ICD-10-CM | POA: Insufficient documentation

## 2011-11-29 DIAGNOSIS — Z9884 Bariatric surgery status: Secondary | ICD-10-CM | POA: Insufficient documentation

## 2011-11-29 DIAGNOSIS — I10 Essential (primary) hypertension: Secondary | ICD-10-CM | POA: Insufficient documentation

## 2011-11-29 MED ORDER — HYDROMORPHONE HCL PF 1 MG/ML IJ SOLN
1.0000 mg | Freq: Once | INTRAMUSCULAR | Status: AC
  Administered 2011-11-29: 1 mg via INTRAVENOUS

## 2011-11-29 MED ORDER — HYDROMORPHONE HCL PF 1 MG/ML IJ SOLN
1.0000 mg | Freq: Once | INTRAMUSCULAR | Status: AC
  Administered 2011-11-29: 1 mg via INTRAVENOUS
  Filled 2011-11-29: qty 1

## 2011-11-29 MED ORDER — PSEUDOEPHEDRINE HCL 60 MG PO TABS: 60.0000 mg | ORAL_TABLET | ORAL | Status: DC | PRN

## 2011-11-29 MED ORDER — SODIUM CHLORIDE 0.9 % IV SOLN
Freq: Once | INTRAVENOUS | Status: AC
  Administered 2011-11-29: 11:00:00 via INTRAVENOUS

## 2011-11-29 MED ORDER — PHENYLEPHRINE 200 MCG/ML FOR PRIAPISM / HYPOTENSION
500.0000 ug | INTRAMUSCULAR | Status: DC | PRN
  Administered 2011-11-29: 11:00:00 via INTRACAVERNOUS
  Filled 2011-11-29 (×2): qty 50

## 2011-11-29 MED ORDER — HYDROMORPHONE HCL PF 1 MG/ML IJ SOLN
INTRAMUSCULAR | Status: AC
  Administered 2011-11-29: 1 mg via INTRAVENOUS
  Filled 2011-11-29: qty 1

## 2011-11-29 NOTE — Consult Note (Signed)
  Urology Consult   Physician requesting consult: Dr. Prince Rome  Reason for consult: Priapism  History of Present Illness: Bradley [name] is a 41 y.o. established Caucasian patient of Dr. Matilde Sprang with a history of recurrent priapism of unknown etiology. He developed an erection yesterday morning at approximately 11 AM which was persistent and ultimately became painful at approximately 6 or 7 PM yesterday evening. He did not seek treatment until early this morning when he called our office and was recommended to proceed directly to the emergency department. He was injected with phenylephrine by Dr. Prince Rome twice with subsequent moderate detumescence. However, Dr. Prince Rome had concerns that he was not completely detumesced and asked for a urology consultation. He denies the use of any precipitating medications and specifically denies using any PDE 5 inhibitors, illegal drugs, or other medications. He does take hydrocodone on a fairly regular basis for orthopedic reasons.He has no history of sickle cell trait or sickle cell disease.   Past Medical History  Diagnosis Date  . Hypertension   . Kidney stone     Past Surgical History  Procedure Date  . Gastric bypass   . Back surgery   . Ankle surgery   . Knee surgery   . Back surgery     Current Hospital Medications:  Home Meds: @Hmed @  Scheduled Meds:   . sodium chloride   Intravenous Once  .  HYDROmorphone (DILAUDID) injection  1 mg Intravenous Once  .  HYDROmorphone (DILAUDID) injection  1 mg Intravenous Once   Continuous Infusions:  PRN Meds:.phenylephrine 200 mcg / ml CONC. DILUTION INJ (ED / Urology USE ONLY)  Allergies: No Known Allergies  No family history on file.  Social History:  reports that he has never smoked. He does not have any smokeless tobacco history on file. He reports that he does not drink alcohol or use illicit drugs.  ROS: A complete review of systems was performed.  All systems are negative except for  pertinent findings as noted.  Physical Exam:  Vital signs in last 24 hours: Temp:  [98.2 F (36.8 C)-98.7 F (37.1 C)] 98.2 F (36.8 C) (09/25 1341) Pulse Rate:  [79-83] 80  (09/25 1341) Resp:  [16-20] 20  (09/25 1341) BP: (144)/(93-98) 144/93 mmHg (09/25 1115) SpO2:  [100 %] 100 % (09/25 1115) Weight:  [95.255 kg (210 lb)] 95.255 kg (210 lb) (09/25 4944) General:  Alert and oriented, No acute distress GU: I examined his penis which is still somewhat firm and "woody" but is detumesced at this time without persistent priapism. The patient states that his exam is been similar to this over the past 2 hours. He has minimal pain on examination.    Impression/Assessment:  Priapism  Plan:  I think it is appropriate for him to be discharged at this time from the hospital. I counseled [name] that based on the length of time that he had priapism beginning yesterday morning that he may have some permanent effects and be a risk for erectile dysfunction. We also discussed options regarding management going forward and he was provided a prescription for pseudoephedrine considering the fact that he has had to buy large quantities of this medication recently and he believes that the pharmacy has concerns about him buying this medication at this time. I recommended that he follow up with Dr. Matilde Sprang for further evaluation in the next few weeks.  Any Mcneice,LES 11/29/2011, 4:32 PM    Pryor Curia MD

## 2011-11-29 NOTE — ED Provider Notes (Signed)
History     CSN: [id number]  Arrival date & time 11/29/11  0903   First MD Initiated Contact with Patient 11/29/11 [phone]      Chief Complaint  Patient presents with  . Groin Swelling    (Consider location/radiation/quality/duration/timing/severity/associated sxs/prior treatment) HPI Comments: Bradley [name] presents with a complaint of penile pain.  He has a hx of priapism and reports that he has had an erection since 1600 yesterday.  He attempted taking sudafed as he had been instructed to do previously without any relief or improvement of the erection.  He denies using any long-acting nitrates or medications like viagra.  He also denies any hx of sickle cell dx.  The history is provided by the patient. No language interpreter was used.    Past Medical History  Diagnosis Date  . Hypertension   . Kidney stone     Past Surgical History  Procedure Date  . Gastric bypass   . Back surgery   . Ankle surgery   . Knee surgery   . Back surgery     No family history on file.  History  Substance Use Topics  . Smoking status: Never Smoker   . Smokeless tobacco: Not on file  . Alcohol Use: No      Review of Systems  Constitutional: Negative for activity change and appetite change.  HENT: Negative.   Eyes: Negative.   Respiratory: Negative.   Cardiovascular: Negative.   Gastrointestinal: Negative.   Genitourinary: Positive for penile swelling and penile pain. Negative for dysuria, urgency, frequency, hematuria, flank pain, genital sores and testicular pain.  Musculoskeletal: Negative.   Neurological: Negative.   Hematological: Negative.   Psychiatric/Behavioral: Negative.     Allergies  Review of patient's allergies indicates no known allergies.  Home Medications   Current Outpatient Rx  Name Route Sig Dispense Refill  . AMOXICILLIN 500 MG PO CAPS Oral Take 500 mg by mouth 4 (four) times daily. For 7 days    . NAPROXEN SODIUM 220 MG PO TABS Oral Take 440 mg by mouth  2 (two) times daily as needed. For pain    . PSEUDOEPHEDRINE HCL 30 MG PO TABS Oral Take 60 mg by mouth every 4 (four) hours as needed. For cold      BP 144/98  Pulse 83  Temp 98.7 F (37.1 C) (Oral)  Resp 19  Wt 210 lb (95.255 kg)  SpO2 100%  Physical Exam  Nursing note and vitals reviewed. Constitutional: He is oriented to person, place, and time. He appears well-developed and well-nourished. No distress.  HENT:  Head: Normocephalic and atraumatic.  Right Ear: External ear normal.  Left Ear: External ear normal.  Nose: Nose normal.  Mouth/Throat: Oropharynx is clear and moist. No oropharyngeal exudate.  Eyes: Conjunctivae normal are normal. Pupils are equal, round, and reactive to light. Right eye exhibits no discharge. Left eye exhibits discharge. No scleral icterus.  Neck: Normal range of motion. Neck supple. No JVD present. No tracheal deviation present. No thyromegaly present.  Cardiovascular: Normal rate, regular rhythm and intact distal pulses.  Exam reveals no gallop and no friction rub.   No murmur heard. Pulmonary/Chest: Effort normal and breath sounds normal. No stridor. No respiratory distress. He has no wheezes. He has no rales. He exhibits no tenderness.  Abdominal: Soft. Bowel sounds are normal. He exhibits no distension and no mass. There is no tenderness. There is no rebound and no guarding. Hernia confirmed negative in the right inguinal area and  confirmed negative in the left inguinal area.  Genitourinary: Testes normal. Right testis shows no mass. Left testis shows no mass. No phimosis, paraphimosis, hypospadias, penile erythema or penile tenderness. No discharge found.       Note erect penis.  Musculoskeletal: Normal range of motion. He exhibits no edema and no tenderness.  Lymphadenopathy:       Right: No inguinal adenopathy present.       Left: No inguinal adenopathy present.  Neurological: He is alert and oriented to person, place, and time.  Skin: Skin is  warm and dry. No rash noted. He is not diaphoretic. No erythema. No pallor.  Psychiatric: He has a normal mood and affect. His behavior is normal.    ED Course  Procedures (including critical care time)  Labs Reviewed - No data to display No results found.   No diagnosis found.    MDM  Pt presents for evaluation of priapism.  He has had an erection since 1600 yesterday and is now having severe pain. He has had similar episodes in the past.  Will administer a phenylephrine injection as well as pain medication and reassess.   1330.  Administered 2 injections of diluted phenylephrine solution 2.90m x 2 of a 500 mcg/ml solution) into pt's corpus cavernosum.  Noted significant detumescence but there was still some residual penile firmness.  I discussed his evaluation and care with Dr. BAlinda Money(urology).  He performed a bedside evaluation and has explained that it is normal after having priapism to observe residual penile firmness.  He recommends no further injections or treatment.  Plan discharge home.       MPerlie Mayo MD 11/29/11 [phone]

## 2011-11-29 NOTE — ED Notes (Signed)
Phenylephrine kit at [facility]

## 2011-11-29 NOTE — ED Notes (Signed)
Pt was given prescription by the urologist.

## 2011-11-29 NOTE — ED Notes (Signed)
States that he has an erection since yesterday morning. States that he has had this before  Dr. Nicki Reaper McDermit with [facility] informed him to take sudafed if it happened again which he did with no resolve. States last erection MD had to aspirate.

## 2012-07-25 ENCOUNTER — Ambulatory Visit (INDEPENDENT_AMBULATORY_CARE_PROVIDER_SITE_OTHER): Payer: 59 | Admitting: Internal Medicine

## 2012-07-25 ENCOUNTER — Other Ambulatory Visit (INDEPENDENT_AMBULATORY_CARE_PROVIDER_SITE_OTHER): Payer: 59

## 2012-07-25 ENCOUNTER — Encounter: Payer: Self-pay | Admitting: Internal Medicine

## 2012-07-25 VITALS — BP 132/86 | HR 67 | Temp 97.8°F | Resp 16 | Ht 71.0 in | Wt 234.0 lb

## 2012-07-25 DIAGNOSIS — E559 Vitamin D deficiency, unspecified: Secondary | ICD-10-CM

## 2012-07-25 DIAGNOSIS — Z9884 Bariatric surgery status: Secondary | ICD-10-CM

## 2012-07-25 DIAGNOSIS — I10 Essential (primary) hypertension: Secondary | ICD-10-CM

## 2012-07-25 DIAGNOSIS — D509 Iron deficiency anemia, unspecified: Secondary | ICD-10-CM

## 2012-07-25 DIAGNOSIS — Z0000 Encounter for general adult medical examination without abnormal findings: Secondary | ICD-10-CM

## 2012-07-25 DIAGNOSIS — Z23 Encounter for immunization: Secondary | ICD-10-CM

## 2012-07-25 LAB — URINALYSIS, ROUTINE W REFLEX MICROSCOPIC
Bilirubin Urine: NEGATIVE
Ketones, ur: NEGATIVE
Nitrite: NEGATIVE
Specific Gravity, Urine: 1.025 (ref 1.000–1.030)
Total Protein, Urine: NEGATIVE
Urine Glucose: NEGATIVE
Urobilinogen, UA: 0.2 (ref 0.0–1.0)
pH: 6 (ref 5.0–8.0)

## 2012-07-25 LAB — CBC WITH DIFFERENTIAL/PLATELET
Basophils Absolute: 0 10*3/uL (ref 0.0–0.1)
Basophils Relative: 0.2 % (ref 0.0–3.0)
Eosinophils Absolute: 0.1 10*3/uL (ref 0.0–0.7)
Eosinophils Relative: 1.5 % (ref 0.0–5.0)
HCT: 35.7 % — ABNORMAL LOW (ref 39.0–52.0)
Hemoglobin: 12 g/dL — ABNORMAL LOW (ref 13.0–17.0)
Lymphocytes Relative: 26.1 % (ref 12.0–46.0)
Lymphs Abs: 1.1 10*3/uL (ref 0.7–4.0)
MCHC: 33.6 g/dL (ref 30.0–36.0)
MCV: 83.3 fl (ref 78.0–100.0)
Monocytes Absolute: 0.7 10*3/uL (ref 0.1–1.0)
Monocytes Relative: 17.8 % — ABNORMAL HIGH (ref 3.0–12.0)
Neutro Abs: 2.3 10*3/uL (ref 1.4–7.7)
Neutrophils Relative %: 54.4 % (ref 43.0–77.0)
Platelets: 177 10*3/uL (ref 150.0–400.0)
RBC: 4.28 Mil/uL (ref 4.22–5.81)
RDW: 13.9 % (ref 11.5–14.6)
WBC: 4.2 10*3/uL — ABNORMAL LOW (ref 4.5–10.5)

## 2012-07-25 LAB — COMPREHENSIVE METABOLIC PANEL
ALT: 23 U/L (ref 0–53)
AST: 24 U/L (ref 0–37)
Albumin: 3.7 g/dL (ref 3.5–5.2)
Alkaline Phosphatase: 53 U/L (ref 39–117)
BUN: 11 mg/dL (ref 6–23)
CO2: 27 mEq/L (ref 19–32)
Calcium: 8.5 mg/dL (ref 8.4–10.5)
Chloride: 104 mEq/L (ref 96–112)
Creatinine, Ser: 0.9 mg/dL (ref 0.4–1.5)
GFR: 98.77 mL/min (ref >60.00)
Glucose, Bld: 90 mg/dL (ref 70–99)
Potassium: 4.9 mEq/L (ref 3.5–5.1)
Sodium: 137 mEq/L (ref 135–145)
Total Bilirubin: 0.5 mg/dL (ref 0.3–1.2)
Total Protein: 6.5 g/dL (ref 6.0–8.3)

## 2012-07-25 LAB — VITAMIN B12: Vitamin B-12: 307 pg/mL (ref 211–911)

## 2012-07-25 LAB — IBC PANEL
Iron: 36 ug/dL — ABNORMAL LOW (ref 42–165)
Saturation Ratios: 8.8 % — ABNORMAL LOW (ref 20.0–50.0)
Transferrin: 291.2 mg/dL (ref 212.0–360.0)

## 2012-07-25 LAB — TSH: TSH: 1.57 u[IU]/mL (ref 0.35–5.50)

## 2012-07-25 LAB — FERRITIN: Ferritin: 10.4 ng/mL — ABNORMAL LOW (ref 22.0–322.0)

## 2012-07-25 LAB — FOLATE: Folate: 9.1 ng/mL (ref >5.9)

## 2012-07-25 LAB — LIPID PANEL
Cholesterol: 113 mg/dL (ref 0–200)
HDL: 41.5 mg/dL (ref >39.00)
LDL Cholesterol: 64 mg/dL (ref 0–99)
Total CHOL/HDL Ratio: 3
Triglycerides: 40 mg/dL (ref 0.0–149.0)
VLDL: 8 mg/dL (ref 0.0–40.0)

## 2012-07-25 MED ORDER — FERRALET 90 90-1 MG PO TABS: 1.0000 | ORAL_TABLET | Freq: Every day | ORAL | Status: DC

## 2012-07-25 MED ORDER — SCOPOLAMINE 1 MG/3DAYS TD PT72: 1.0000 | MEDICATED_PATCH | TRANSDERMAL | Status: DC

## 2012-07-25 NOTE — Assessment & Plan Note (Signed)
I will check his labs to look for complications and vitamin deficiencies

## 2012-07-25 NOTE — Progress Notes (Signed)
  Subjective:    Patient ID: Bradley [name], male    DOB: [birth year], 42 y.o.   MRN: [id number]  HPI  New to me for a complete physical, he feels well and offers no complaints.  Review of Systems  Constitutional: Negative.   HENT: Negative.   Eyes: Negative.   Respiratory: Negative.   Cardiovascular: Negative.   Gastrointestinal: Negative.   Endocrine: Negative.   Genitourinary: Negative.   Musculoskeletal: Negative.   Skin: Negative.   Allergic/Immunologic: Negative.   Neurological: Negative.   Hematological: Negative.   Psychiatric/Behavioral: Negative.        Objective:   Physical Exam  Vitals reviewed. Constitutional: He is oriented to person, place, and time. He appears well-developed and well-nourished. No distress.  HENT:  Head: Normocephalic and atraumatic.  Mouth/Throat: Oropharynx is clear and moist. No oropharyngeal exudate.  Eyes: Conjunctivae are normal. Right eye exhibits no discharge. Left eye exhibits no discharge. No scleral icterus.  Neck: Normal range of motion. Neck supple. No JVD present. No tracheal deviation present. No thyromegaly present.  Cardiovascular: Normal rate, regular rhythm, normal heart sounds and intact distal pulses.  Exam reveals no gallop and no friction rub.   No murmur heard. Pulmonary/Chest: Effort normal and breath sounds normal. No stridor. No respiratory distress. He has no wheezes. He has no rales.  Abdominal: Soft. Bowel sounds are normal. He exhibits no distension and no mass. There is no tenderness. There is no rebound and no guarding. Hernia confirmed negative in the right inguinal area and confirmed negative in the left inguinal area.  Genitourinary: Testes normal and penis normal. Right testis shows no mass, no swelling and no tenderness. Right testis is descended. Left testis shows no mass, no swelling and no tenderness. Left testis is descended. Circumcised. No penile erythema or penile tenderness. No discharge found.   Lymphadenopathy:    He has no cervical adenopathy.       Right: No inguinal adenopathy present.       Left: No inguinal adenopathy present.  Neurological: He is oriented to person, place, and time.  Skin: Skin is warm and dry. No rash noted. He is not diaphoretic. No erythema. No pallor.  Psychiatric: He has a normal mood and affect. His behavior is normal. Judgment and thought content normal.          Assessment & Plan:

## 2012-07-25 NOTE — Assessment & Plan Note (Signed)
His BP is well controlled 

## 2012-07-25 NOTE — Assessment & Plan Note (Signed)
Exam done Vaccines were reviewed and updated Labs ordered Pt ed material was given 

## 2012-07-25 NOTE — Patient Instructions (Signed)
Health Maintenance, Males A healthy lifestyle and preventative care can promote health and wellness.  Maintain regular health, dental, and eye exams.  Eat a healthy diet. Foods like vegetables, fruits, whole grains, low-fat dairy products, and lean protein foods contain the nutrients you need without too many calories. Decrease your intake of foods high in solid fats, added sugars, and salt. Get information about a proper diet from your caregiver, if necessary.  Regular physical exercise is one of the most important things you can do for your health. Most adults should get at least 150 minutes of moderate-intensity exercise (any activity that increases your heart rate and causes you to sweat) each week. In addition, most adults need muscle-strengthening exercises on 2 or more days a week.   Maintain a healthy weight. The body mass index (BMI) is a screening tool to identify possible weight problems. It provides an estimate of body fat based on height and weight. Your caregiver can help determine your BMI, and can help you achieve or maintain a healthy weight. For adults 20 years and older:  A BMI below 18.5 is considered underweight.  A BMI of 18.5 to 24.9 is normal.  A BMI of 25 to 29.9 is considered overweight.  A BMI of 30 and above is considered obese.  Maintain normal blood lipids and cholesterol by exercising and minimizing your intake of saturated fat. Eat a balanced diet with plenty of fruits and vegetables. Blood tests for lipids and cholesterol should begin at age 20 and be repeated every 5 years. If your lipid or cholesterol levels are high, you are over 50, or you are a high risk for heart disease, you may need your cholesterol levels checked more frequently.Ongoing high lipid and cholesterol levels should be treated with medicines, if diet and exercise are not effective.  If you smoke, find out from your caregiver how to quit. If you do not use tobacco, do not start.  If you  choose to drink alcohol, do not exceed 2 drinks per day. One drink is considered to be 12 ounces (355 mL) of beer, 5 ounces (148 mL) of wine, or 1.5 ounces (44 mL) of liquor.  Avoid use of street drugs. Do not share needles with anyone. Ask for help if you need support or instructions about stopping the use of drugs.  High blood pressure causes heart disease and increases the risk of stroke. Blood pressure should be checked at least every 1 to 2 years. Ongoing high blood pressure should be treated with medicines if weight loss and exercise are not effective.  If you are 45 to 44 years old, ask your caregiver if you should take aspirin to prevent heart disease.  Diabetes screening involves taking a blood sample to check your fasting blood sugar level. This should be done once every 3 years, after age 45, if you are within normal weight and without risk factors for diabetes. Testing should be considered at a younger age or be carried out more frequently if you are overweight and have at least 1 risk factor for diabetes.  Colorectal cancer can be detected and often prevented. Most routine colorectal cancer screening begins at the age of 50 and continues through age 75. However, your caregiver may recommend screening at an earlier age if you have risk factors for colon cancer. On a yearly basis, your caregiver may provide home test kits to check for hidden blood in the stool. Use of a small camera at the end of a tube,   to directly examine the colon (sigmoidoscopy or colonoscopy), can detect the earliest forms of colorectal cancer. Talk to your caregiver about this at age 50, when routine screening begins. Direct examination of the colon should be repeated every 5 to 10 years through age 75, unless early forms of pre-cancerous polyps or small growths are found.  Hepatitis C blood testing is recommended for all people born from 1945 through 1965 and any individual with known risks for hepatitis C.  Healthy  men should no longer receive prostate-specific antigen (PSA) blood tests as part of routine cancer screening. Consult with your caregiver about prostate cancer screening.  Testicular cancer screening is not recommended for adolescents or adult males who have no symptoms. Screening includes self-exam, caregiver exam, and other screening tests. Consult with your caregiver about any symptoms you have or any concerns you have about testicular cancer.  Practice safe sex. Use condoms and avoid high-risk sexual practices to reduce the spread of sexually transmitted infections (STIs).  Use sunscreen with a sun protection factor (SPF) of 30 or greater. Apply sunscreen liberally and repeatedly throughout the day. You should seek shade when your shadow is shorter than you. Protect yourself by wearing long sleeves, pants, a wide-brimmed hat, and sunglasses year round, whenever you are outdoors.  Notify your caregiver of new moles or changes in moles, especially if there is a change in shape or color. Also notify your caregiver if a mole is larger than the size of a pencil eraser.  A one-time screening for abdominal aortic aneurysm (AAA) and surgical repair of large AAAs by sound wave imaging (ultrasonography) is recommended for ages 65 to 75 years who are current or former smokers.  Stay current with your immunizations. Document Released: 08/19/2007 Document Revised: 05/15/2011 Document Reviewed: 07/18/2010 [employer] Patient Information 2014 [employer].  

## 2012-07-25 NOTE — Addendum Note (Signed)
Addended by: [name] on: 07/25/2012 04:53 PM   Modules accepted: Orders

## 2012-07-26 DIAGNOSIS — E559 Vitamin D deficiency, unspecified: Secondary | ICD-10-CM | POA: Insufficient documentation

## 2012-07-26 LAB — VITAMIN D 25 HYDROXY (VIT D DEFICIENCY, FRACTURES): Vit D, 25-Hydroxy: 23 ng/mL — ABNORMAL LOW (ref 30–89)

## 2012-07-26 MED ORDER — CHOLECALCIFEROL 1.25 MG (50000 UT) PO TABS: 1.0000 | ORAL_TABLET | ORAL | Status: DC

## 2012-07-26 NOTE — Addendum Note (Signed)
Addended by: [name] on: 07/26/2012 07:39 AM   Modules accepted: Orders

## 2012-07-31 ENCOUNTER — Encounter: Payer: Self-pay | Admitting: Internal Medicine

## 2012-07-31 LAB — VITAMIN B1: Vitamin B1 (Thiamine): 19 nmol/L (ref 8–30)

## 2012-12-20 ENCOUNTER — Encounter (HOSPITAL_BASED_OUTPATIENT_CLINIC_OR_DEPARTMENT_OTHER): Payer: Self-pay | Admitting: *Deleted

## 2013-03-03 NOTE — Progress Notes (Signed)
Wife states surgery cancelled

## 2013-03-07 ENCOUNTER — Encounter (HOSPITAL_BASED_OUTPATIENT_CLINIC_OR_DEPARTMENT_OTHER): Admission: RE | Payer: Self-pay | Source: Ambulatory Visit

## 2013-03-07 ENCOUNTER — Ambulatory Visit (HOSPITAL_BASED_OUTPATIENT_CLINIC_OR_DEPARTMENT_OTHER): Admission: RE | Admit: 2013-03-07 | Payer: 59 | Source: Ambulatory Visit | Admitting: Orthopedic Surgery

## 2013-03-07 SURGERY — SHOULDER ARTHROSCOPY WITH ROTATOR CUFF REPAIR AND SUBACROMIAL DECOMPRESSION
Anesthesia: General | Site: Shoulder | Laterality: Right

## 2013-05-18 ENCOUNTER — Encounter (HOSPITAL_COMMUNITY): Payer: Self-pay | Admitting: Emergency Medicine

## 2013-05-18 ENCOUNTER — Emergency Department (HOSPITAL_COMMUNITY)
Admission: EM | Admit: 2013-05-18 | Discharge: 2013-05-18 | Disposition: A | Discharge status: Home / Self Care | Payer: 59 | Attending: Emergency Medicine | Admitting: Emergency Medicine

## 2013-05-18 DIAGNOSIS — Z87442 Personal history of urinary calculi: Secondary | ICD-10-CM | POA: Insufficient documentation

## 2013-05-18 DIAGNOSIS — Z79899 Other long term (current) drug therapy: Secondary | ICD-10-CM | POA: Insufficient documentation

## 2013-05-18 DIAGNOSIS — I10 Essential (primary) hypertension: Secondary | ICD-10-CM | POA: Insufficient documentation

## 2013-05-18 DIAGNOSIS — IMO0002 Reserved for concepts with insufficient information to code with codable children: Secondary | ICD-10-CM | POA: Insufficient documentation

## 2013-05-18 DIAGNOSIS — Z9884 Bariatric surgery status: Secondary | ICD-10-CM | POA: Insufficient documentation

## 2013-05-18 DIAGNOSIS — N4830 Priapism, unspecified: Secondary | ICD-10-CM | POA: Insufficient documentation

## 2013-05-18 MED ORDER — MORPHINE SULFATE 4 MG/ML IJ SOLN
4.0000 mg | Freq: Once | INTRAMUSCULAR | Status: AC
  Administered 2013-05-18: 4 mg via INTRAMUSCULAR
  Filled 2013-05-18: qty 1

## 2013-05-18 MED ORDER — LIDOCAINE HCL 1 % IJ SOLN
INTRAMUSCULAR | Status: AC
  Filled 2013-05-18: qty 20

## 2013-05-18 MED ORDER — PHENYLEPHRINE 200 MCG/ML FOR PRIAPISM / HYPOTENSION
200.0000 ug | INTRAMUSCULAR | Status: DC | PRN
  Filled 2013-05-18: qty 50

## 2013-05-18 NOTE — ED Provider Notes (Signed)
CSN: [id number]     Arrival date & time 05/18/13  0524 History   First MD Initiated Contact with Patient 05/18/13 [phone]     Chief Complaint  Patient presents with  . Personal Problem     (Consider location/radiation/quality/duration/timing/severity/associated sxs/prior Treatment) The history is provided by the patient and the spouse.   Patient present with priapism > 24 hours.  States he has had this problem intermittently for years as a result of medication side effects.  States he had a kidney stone last week and was given flomax and notes that this may a side effect of that medication.  States he took the medication once and had an erection for approximately 12 hours.  He took it again and has had an erection for > 24 hours.  Pain is described as increased pressure and sensitivity.  Has tried oral sudafed, ice, walking without improvement.  Denies fevers, abdominal pain, N/V, testicular pain or swelling.  Past Medical History  Diagnosis Date  . Hypertension   . Kidney stone   . Anxiety   . Depression   . Displacement of intervertebral disc, site unspecified, without myelopathy   . Other, mixed, or unspecified nondependent drug abuse, unspecified   . Priapism   . Arthropathy, unspecified, other specified sites   . Lumbago   . Contact with or exposure to venereal diseases   . Personal history of urinary calculi   . Obesity, unspecified    Past Surgical History  Procedure Laterality Date  . Gastric bypass    . Ankle surgery    . Knee surgery    . Hernia repair    . Panectomy    . Incision and drainage abscess Right 07/06/2004    great toe  . Irrigation and debridement abscess Right 07/08/2004    great toe  . Microdiscectomy lumbar Left 08/24/2008    T12 - L1   Family History  Problem Relation Age of Onset  . Arthritis Mother   . Heart disease Father   . Hypertension Father   . Kidney disease Father    History  Substance Use Topics  . Smoking status: Never Smoker   .  Smokeless tobacco: Never Used  . Alcohol Use: No    Review of Systems  Constitutional: Negative for fever.  Respiratory: Negative for cough and shortness of breath.   Cardiovascular: Negative for chest pain.  Gastrointestinal: Negative for nausea, vomiting, abdominal pain and diarrhea.  Genitourinary: Positive for penile pain. Negative for dysuria, urgency and frequency.  All other systems reviewed and are negative.      Allergies  Percocet  Home Medications   Current Outpatient Rx  Name  Route  Sig  Dispense  Refill  . pseudoephedrine (SUDAFED) 60 MG tablet   Oral   Take 60 mg by mouth every 4 (four) hours as needed.         . tamsulosin (FLOMAX) 0.4 MG CAPS capsule   Oral   Take 0.4 mg by mouth every morning.          BP 161/105  Pulse 82  Temp(Src) 98.2 F (36.8 C) (Oral)  Resp 14  SpO2 100% Physical Exam  Nursing note and vitals reviewed. Constitutional: He appears well-developed and well-nourished. No distress.  HENT:  Head: Normocephalic and atraumatic.  Eyes: Conjunctivae are normal.  Neck: Normal range of motion. Neck supple.  Pulmonary/Chest: Effort normal.  Abdominal: Soft. He exhibits no distension. There is no tenderness. There is no rebound and no guarding.  Genitourinary: Right testis shows no swelling. Left testis shows no swelling. Circumcised.  Penis is erect.  No discoloration.   Musculoskeletal: He exhibits no edema.  Neurological: He is alert. He exhibits normal muscle tone.  Skin: He is not diaphoretic. No erythema. No pallor.  Psychiatric: He has a normal mood and affect. His behavior is normal.    ED Course  Procedures (including critical care time) Labs Review Labs Reviewed - No data to display Imaging Review No results found.   EKG Interpretation None      PRIAPISM TREATMENT (injection) Patient was prepped and draped in standard sterile fashion Just prior to procedure a timeout was performed Left corpus cavernosum was  entered with a 25-gauge syringe  (2:00) Blood was aspirated 1cc of 200 mcg/ml phenylephrine was injected The patient tolerated the procedure No complications  PRIAPISM TREATMENT (injection) Patient was prepped and draped in standard sterile fashion Just prior to procedure a timeout was performed Left corpus cavernosum was entered with a 25-gauge syringe  (10:00) Blood was aspirated 1 cc of 200 mcg/ml phenylephrine was injected The patient tolerated the procedure No complications   7:10 AM Discussed pt and plan with Dr Ashok Cordia.   6:36 AM Injection of 250mg phenylephrine solution into the corpus cavernosum, 2 o'clock  7:07 AM Injection of 200 mcg phenylephrine solution into the corpus cavernosum 10 o'clock  7:35 AM Dr GRisa Grillto see patient in ED.    9:30 AM Pt continues to feel well 30-40 minutes following procedure.  Reports detumescence and resolved pain.  Plan is for d/c home with urology follow up.   MDM   Final diagnoses:  Priapism   Pt with hx recurrent priapism presents with same >24 hours.  Two attempts made with injected phenylephrine with minimal improvement.  Urology consulted.  Procedure by Dr GRisa Grillrelieved priapism.  Pt feeling well following procedures, no immediately complications.  D/C home with urology follow up.  Discussed treatment, and follow up  with patient.  Pt given return precautions.  Pt verbalizes understanding and agrees with plan.       EClayton Bibles PA-C 05/18/13 1140

## 2013-05-18 NOTE — Consult Note (Signed)
Urology Consult  Referring physician: Clayton Bibles, PA-C Reason for referral: Priapism times greater than 24 hours  History of Present Illness: [name] is a 43 year old Caucasian male has a history of recurrent priapism of idiopathic etiology he is required penile your patients in the past. He has been prescribe Sudafed take at home which does often resolved his episodes spontaneously. He is also erectile dysfunction and does intermittently use oral agents for treatment of his ED. He has been under the care of Bradley [name]. He was recently in our office with flank pain question ureteral calculus. He was started on tamsulosin which seemed to exacerbate his priapism. He has had an erection for greater than 24 hours. In the emergency room 2 attempts at phenylephrine injection were performed without success. The patient denies any recent recreational drugs.  Past Medical History  Diagnosis Date  . Hypertension   . Kidney stone   . Anxiety   . Depression   . Displacement of intervertebral disc, site unspecified, without myelopathy   . Other, mixed, or unspecified nondependent drug abuse, unspecified   . Priapism   . Arthropathy, unspecified, other specified sites   . Lumbago   . Contact with or exposure to venereal diseases   . Personal history of urinary calculi   . Obesity, unspecified    Past Surgical History  Procedure Laterality Date  . Gastric bypass    . Ankle surgery    . Knee surgery    . Hernia repair    . Panectomy    . Incision and drainage abscess Right 07/06/2004    great toe  . Irrigation and debridement abscess Right 07/08/2004    great toe  . Microdiscectomy lumbar Left 08/24/2008    T12 - L1    Medications: Prior to Admission:  (Not in a hospital admission)  Allergies:  Allergies  Allergen Reactions  . Percocet [Oxycodone-Acetaminophen] Hives and Itching    Hives and itching    Family History  Problem Relation Age of Onset  . Arthritis Mother   .  Heart disease Father   . Hypertension Father   . Kidney disease Father     Social History:  reports that he has never smoked. He has never used smokeless tobacco. He reports that he does not drink alcohol or use illicit drugs.  ROS negative except for prolonged erection and penile discomfort  Physical Exam:  Vital signs in last 24 hours: Temp:  [98.2 F (36.8 C)-98.4 F (36.9 C)] 98.4 F (36.9 C) (03/15 0750) Pulse Rate:  [76-82] 76 (03/15 0750) Resp:  [14-16] 16 (03/15 0750) BP: (161-183)/(105-109) 183/109 mmHg (03/15 0750) SpO2:  [100 %] 100 % (03/15 0750)  Constitutional: Vital signs reviewed. WD WN in NAD Head: Normocephalic and atraumatic   Eyes: PERRL, No scleral icterus.  Neck: Supple No  Gross JVD, mass, thyromegaly, or carotid bruit present.  Cardiovascular: RRR Pulmonary/Chest: Normal effort Abdominal: Soft. Non-tender, non-distended, bowel sounds are normal, no masses, organomegaly, or guarding present.  Genitourinary: Fully erect penis without other abnormalities Extremities: No cyanosis or edema  Neurological: Grossly non-focal.  Skin: Warm,very dry and intact. No rash, cyanosis   Laboratory Data:  No results found for this or any previous visit (from the past 72 hour([name])). No results found for this or any previous visit (from the past 240 hour([name])). Creatinine: No results found for this basename: CREATININE,  in the last 168 hours Baseline Creatinine:   Impression/Assessment:  Priapism etiology idiopathic/ ischemic low flow priapism  based on prior episodes  Plan:  The patient underwent corporal irrigation. Dilute phenylephrine solution was used along with injectable saline. Dark blood was your gated out of the corporal cylinders. Approximately 50 cc of saline was irrigated and flushed to the corporal cylinders. At the completion of the procedure the rectum was 50% detumesced. Pressure was held on the injection site and the patient will have ice applied and be  observed for approximately 60 minutes prior to discharge. He will follow up with Bradley [name] 05/18/2013, 9:14 AM

## 2013-05-18 NOTE — ED Notes (Signed)
MD at bedside. 

## 2013-05-18 NOTE — ED Notes (Signed)
MD at bedside. Dr. Risa Grill in with the patient.

## 2013-05-18 NOTE — ED Notes (Signed)
Pt takes flomax,  He has an erection and has had this multiple times in past due to this medication,  Pt called [facility] and spoke with Dr on call and he said call if we need him in ED.  Pt has taken prescribed sudafed continuously and iced his penis without success

## 2013-05-18 NOTE — Discharge Instructions (Signed)
Read the information below.  You may return to the Emergency Department at any time for worsening condition or any new symptoms that concern you.    Priapism Priapism is a persistent, often painful erection. It is the hardening of the penis in males and of the clitoris in females, even without sexual stimulation. Priapism may come on suddenly. Priapism may last a short while, or may last a long time. Priapism occurs in all ages. The types of priapism include:  Acute prolonged priapism Priapism that comes on suddenly and lasts.  Recurrent acute priapism Priapism that comes on suddenly and tends to happen again.  Chronic priapism Priapism that is persistent but with less of an erection. CAUSES  There are many causes. Causes include:  Blood problems common in people with the following diseases:  Sickle cell disease.  Leukemia.  Side effects of erectile dysfunction medicine. This is the most common cause of priapism.  Side effects of prescription medicine used in the treatment of depression and anxiety.  Illegal use of street drugs such as cocaine and marijuana.  Excessive use of alcohol.  Neurological problems such as multiple sclerosis.  Diabetes mellitus.  The cause may be unknown. SIGNS AND SYMPTOMS  A prolonged erection, usually without sexual stimulation or following the use of erectile dysfunction medicine.  A painful erection. DIAGNOSIS Diagnosis of priapism can usually be confirmed by your health care provider after a physical exam. Your health care provider may have blood tests done to search for a potential cause, such as leukemia or sickle cell disease. TREATMENT  Treatments depend on the cause. Some specific treatments include:  Oxygen and red blood cell transfusions in patients with sickle cell disease.  A special treatment for plasma in those with leukemia.  Removing blood that is trapped.  Treatment with medicine.  Surgical shunting (a passage that is  made to allow blood to flow from one part of the body to another). HOME CARE INFORMATION  Avoid sexual stimulation and intercourse until your health care provider says it is okay.  Avoid the use of alcohol or drugs to minimize recurrence of priapism. SEEK MEDICAL CARE IF:  You experience worsening pain instead of improvement. SEEK IMMEDIATE MEDICAL CARE IF:  You experience fever or shaking chills.  You experience pain, swelling, or redness in your genital or groin area. MAKE SURE YOU:  Understand these instructions.   Will watch your condition.  Will get help right away if you are not doing well or get worse. Document Released: 05/13/2003 Document Revised: 12/11/2012 Document Reviewed: 07/25/2012 [facility] Patient Information 2014 [place].

## 2013-05-18 NOTE — ED Notes (Addendum)
Phenylephrine at bedside, MD Notified

## 2013-05-19 NOTE — ED Provider Notes (Signed)
Medical screening examination/treatment/procedure(s) were conducted as a shared visit with non-physician practitioner(s) and myself.  I personally evaluated the patient during the encounter.   EKG Interpretation None      Pt with priapism, hx same. On exam, erect penis. abd soft nt. Afeb. inj in ed. Urology consult.   Mirna Mires, MD 05/19/13 [phone]

## 2013-06-01 ENCOUNTER — Encounter (HOSPITAL_COMMUNITY): Payer: Self-pay | Admitting: Emergency Medicine

## 2013-06-01 ENCOUNTER — Emergency Department (HOSPITAL_COMMUNITY)
Admission: EM | Admit: 2013-06-01 | Discharge: 2013-06-01 | Disposition: A | Discharge status: Home / Self Care | Payer: 59 | Attending: Emergency Medicine | Admitting: Emergency Medicine

## 2013-06-01 DIAGNOSIS — I10 Essential (primary) hypertension: Secondary | ICD-10-CM | POA: Insufficient documentation

## 2013-06-01 DIAGNOSIS — Z8639 Personal history of other endocrine, nutritional and metabolic disease: Secondary | ICD-10-CM | POA: Insufficient documentation

## 2013-06-01 DIAGNOSIS — Z87442 Personal history of urinary calculi: Secondary | ICD-10-CM | POA: Insufficient documentation

## 2013-06-01 DIAGNOSIS — N4830 Priapism, unspecified: Secondary | ICD-10-CM | POA: Insufficient documentation

## 2013-06-01 DIAGNOSIS — M129 Arthropathy, unspecified: Secondary | ICD-10-CM | POA: Insufficient documentation

## 2013-06-01 DIAGNOSIS — F329 Major depressive disorder, single episode, unspecified: Secondary | ICD-10-CM | POA: Insufficient documentation

## 2013-06-01 DIAGNOSIS — Z862 Personal history of diseases of the blood and blood-forming organs and certain disorders involving the immune mechanism: Secondary | ICD-10-CM | POA: Insufficient documentation

## 2013-06-01 DIAGNOSIS — Z8619 Personal history of other infectious and parasitic diseases: Secondary | ICD-10-CM | POA: Insufficient documentation

## 2013-06-01 DIAGNOSIS — F3289 Other specified depressive episodes: Secondary | ICD-10-CM | POA: Insufficient documentation

## 2013-06-01 DIAGNOSIS — Z8739 Personal history of other diseases of the musculoskeletal system and connective tissue: Secondary | ICD-10-CM | POA: Insufficient documentation

## 2013-06-01 DIAGNOSIS — Z9884 Bariatric surgery status: Secondary | ICD-10-CM | POA: Insufficient documentation

## 2013-06-01 DIAGNOSIS — F411 Generalized anxiety disorder: Secondary | ICD-10-CM | POA: Insufficient documentation

## 2013-06-01 LAB — BASIC METABOLIC PANEL
BUN: 10 mg/dL (ref 6–23)
CO2: 23 mEq/L (ref 19–32)
Calcium: 8.9 mg/dL (ref 8.4–10.5)
Chloride: 104 mEq/L (ref 96–112)
Creatinine, Ser: 0.69 mg/dL (ref 0.50–1.35)
GFR calc Af Amer: >90 mL/min (ref >90)
GFR calc non Af Amer: >90 mL/min (ref >90)
Glucose, Bld: 95 mg/dL (ref 70–99)
Potassium: 3.9 mEq/L (ref 3.7–5.3)
Sodium: 139 mEq/L (ref 137–147)

## 2013-06-01 LAB — CBC WITH DIFFERENTIAL/PLATELET
Basophils Absolute: 0 10*3/uL (ref 0.0–0.1)
Basophils Relative: 0 % (ref 0–1)
Eosinophils Absolute: 0.1 10*3/uL (ref 0.0–0.7)
Eosinophils Relative: 1 % (ref 0–5)
HCT: 35.5 % — ABNORMAL LOW (ref 39.0–52.0)
Hemoglobin: 11.5 g/dL — ABNORMAL LOW (ref 13.0–17.0)
Lymphocytes Relative: 25 % (ref 12–46)
Lymphs Abs: 1.8 10*3/uL (ref 0.7–4.0)
MCH: 26.4 pg (ref 26.0–34.0)
MCHC: 32.4 g/dL (ref 30.0–36.0)
MCV: 81.6 fL (ref 78.0–100.0)
Monocytes Absolute: 0.6 10*3/uL (ref 0.1–1.0)
Monocytes Relative: 8 % (ref 3–12)
Neutro Abs: 4.7 10*3/uL (ref 1.7–7.7)
Neutrophils Relative %: 65 % (ref 43–77)
Platelets: 255 10*3/uL (ref 150–400)
RBC: 4.35 MIL/uL (ref 4.22–5.81)
RDW: 14.2 % (ref 11.5–15.5)
WBC: 7.3 10*3/uL (ref 4.0–10.5)

## 2013-06-01 MED ORDER — LIDOCAINE HCL (PF) 1 % IJ SOLN
30.0000 mL | Freq: Once | INTRAMUSCULAR | Status: AC
  Administered 2013-06-01: 30 mL via INTRADERMAL
  Filled 2013-06-01: qty 30

## 2013-06-01 MED ORDER — MORPHINE SULFATE 4 MG/ML IJ SOLN
4.0000 mg | Freq: Once | INTRAMUSCULAR | Status: AC
  Administered 2013-06-01: 4 mg via INTRAMUSCULAR
  Filled 2013-06-01: qty 1

## 2013-06-01 MED ORDER — MORPHINE SULFATE 4 MG/ML IJ SOLN
6.0000 mg | Freq: Once | INTRAMUSCULAR | Status: AC
  Administered 2013-06-01: 6 mg via INTRAMUSCULAR
  Filled 2013-06-01: qty 2

## 2013-06-01 MED ORDER — PHENYLEPHRINE 200 MCG/ML FOR PRIAPISM / HYPOTENSION: 100.0000 ug | Freq: Once | INTRAMUSCULAR | Status: DC

## 2013-06-01 MED ORDER — PHENYLEPHRINE 200 MCG/ML FOR PRIAPISM / HYPOTENSION
100.0000 ug | Freq: Once | INTRAMUSCULAR | Status: AC
  Administered 2013-06-01: 100 ug via INTRACAVERNOUS
  Filled 2013-06-01: qty 50

## 2013-06-01 NOTE — ED Provider Notes (Signed)
Medical screening examination/treatment/procedure(s) were performed by non-physician practitioner and as supervising physician I was immediately available for consultation/collaboration.  Leota Jacobsen, MD 06/01/13 [phone]

## 2013-06-01 NOTE — ED Notes (Signed)
Pt from home c/o priapism since midnight last pm. He has tried Sudafed and ice without relief. Was seen three weeks ago for same and urology had to be called.

## 2013-06-01 NOTE — Consult Note (Signed)
Urology Consult  CC: Priapism    Consulting MD: Lacretia Leigh  HPI: 43 year old male with several prior episodes of priapism presents to the emergency room with an erection is started around midnight. He has associated penile pain. He is taken Sudafed at home, this has not improved his priapism. He has a prescription for this.  His last treatment was 2-3 weeks ago. He has seen Dr. McDiarmid for this. He is on when necessary Sudafed. He does not have a history of myeloproliferative disorder or sickle cell. In the past, he had no associated medications with this. It was about a year ago. He had a period of time when he was not able to get an erection at all. He had a stone a month or 2 ago, was put on Flomax, and since then has had 2-3 episodes.  He does have a history of a gastric bypass, losing approximately 250 pounds, and has had a tummy tuck.  PMH: Past Medical History  Diagnosis Date  . Hypertension   . Kidney stone   . Anxiety   . Depression   . Displacement of intervertebral disc, site unspecified, without myelopathy   . Other, mixed, or unspecified nondependent drug abuse, unspecified   . Priapism   . Arthropathy, unspecified, other specified sites   . Lumbago   . Contact with or exposure to venereal diseases   . Personal history of urinary calculi   . Obesity, unspecified     PSH: Past Surgical History  Procedure Laterality Date  . Gastric bypass    . Ankle surgery    . Knee surgery    . Hernia repair    . Panectomy    . Incision and drainage abscess Right 07/06/2004    great toe  . Irrigation and debridement abscess Right 07/08/2004    great toe  . Microdiscectomy lumbar Left 08/24/2008    T12 - L1    Allergies: Allergies  Allergen Reactions  . Percocet [Oxycodone-Acetaminophen] Hives and Itching    Hives and itching    Medications:  (Not in a hospital admission)   Social History: History   Social History  . Marital Status: Married    Spouse Name:  N/A    Number of Children: N/A  . Years of Education: N/A   Occupational History  . Not on file.   Social History Main Topics  . Smoking status: Never Smoker   . Smokeless tobacco: Never Used  . Alcohol Use: No  . Drug Use: No  . Sexual Activity: Yes   Other Topics Concern  . Not on file   Social History Narrative  . No narrative on file    Family History: Family History  Problem Relation Age of Onset  . Arthritis Mother   . Heart disease Father   . Hypertension Father   . Kidney disease Father     Review of Systems: Positive: Painful, erect penis Negative:   A further 10 point review of systems was negative except what is listed in the HPI.  Physical Exam: @VITALS2 @ General: No acute distress.  Awake. Head:  Normocephalic.  Atraumatic. ENT:  EOMI.  Mucous membranes moist Neck:  Supple.  No lymphadenopathy. CV:  S1 present. S2 present. Regular rate. Pulmonary: Equal effort bilaterally.  Clear to auscultation bilaterally. Abdomen: Soft.  Non- tender to palpation. Skin:  Normal turgor.  No visible rash. Extremity: No gross deformity of bilateral upper extremities.  No gross deformity of    bilateral lower  extremities. Neurologic: Alert. Appropriate mood.  Penis:  Circumcised.  No lesions. He does have an erection Urethra:  Orthotopic meatus. Scrotum: No lesions.  No ecchymosis.  No erythema. Testicles: Descended bilaterally.  No masses bilaterally. Epididymis: Palpable bilaterally.  Non Tender to palpation.  Studies:  Recent Labs     06/01/13  1348  HGB  11.5*  WBC  7.3  PLT  255    Recent Labs     06/01/13  1348  NA  139  K  3.9  CL  104  CO2  23  BUN  10  CREATININE  0.69  CALCIUM  8.9  GFRNONAA  >90  GFRAA  >90     No results found for this basename: PT, INR, APTT,  in the last 72 hours   No components found with this basename: ABG,    Procedure note:  After verbal consent from the patient, I performed a penile block dorsally at the  base of his penis with 20 mL of 1% plain lidocaine. I then prepped his penis. I aspirated, through an 18-gauge needle in the left base of his penis, approximately 50 cc of dark clotted blood. I then irrigated him in a separate needle stick in the right distal corporal body, with approximately 40 mL of sterile saline. After aspiration of approximately 200 more milliliters of blood, I then, in 2 separate injections about 10 minutes apart, injected 200 mcg of phenylephrine, again in 2 separate aliquots. He then had persistent detumescence. I then removed the needles, placed a compressive dressing. He tolerated the procedure well.  Assessment:  Recurrent priapism  Plan: 1. I discussed with the patient and his wife etiologies of priapism, and let them know that I'm the not aware of the specific etiology with him  2. I think it worthwhile to try him on every other day Cialis, 5 mg to perhaps increase oxygenation of his penis  3. I also suggested that he try to contact Dr. Gareth Eagle at [facility] to consider consultation regarding her the prevention  CC: Dr. Zenia Resides    Pager:[phone]

## 2013-06-01 NOTE — ED Provider Notes (Signed)
3:05 PM = Received sign-out from [employer]. Will continue to follow. Awaiting urology consult.   Results for orders placed during the hospital encounter of 06/01/13  CBC WITH DIFFERENTIAL      Result Value Ref Range   WBC 7.3  4.0 - 10.5 K/uL   RBC 4.35  4.22 - 5.81 MIL/uL   Hemoglobin 11.5 (*) 13.0 - 17.0 g/dL   HCT 35.5 (*) 39.0 - 52.0 %   MCV 81.6  78.0 - 100.0 fL   MCH 26.4  26.0 - 34.0 pg   MCHC 32.4  30.0 - 36.0 g/dL   RDW 14.2  11.5 - 15.5 %   Platelets 255  150 - 400 K/uL   Neutrophils Relative % 65  43 - 77 %   Neutro Abs 4.7  1.7 - 7.7 K/uL   Lymphocytes Relative 25  12 - 46 %   Lymphs Abs 1.8  0.7 - 4.0 K/uL   Monocytes Relative 8  3 - 12 %   Monocytes Absolute 0.6  0.1 - 1.0 K/uL   Eosinophils Relative 1  0 - 5 %   Eosinophils Absolute 0.1  0.0 - 0.7 K/uL   Basophils Relative 0  0 - 1 %   Basophils Absolute 0.0  0.0 - 0.1 K/uL  BASIC METABOLIC PANEL      Result Value Ref Range   Sodium 139  137 - 147 mEq/L   Potassium 3.9  3.7 - 5.3 mEq/L   Chloride 104  96 - 112 mEq/L   CO2 23  19 - 32 mEq/L   Glucose, Bld 95  70 - 99 mg/dL   BUN 10  6 - 23 mg/dL   Creatinine, Ser 0.69  0.50 - 1.35 mg/dL   Calcium 8.9  8.4 - 10.5 mg/dL   GFR calc non Af Amer >90  >90 mL/min   GFR calc Af Amer >90  >90 mL/min   Filed Vitals:   06/01/13 1313 06/01/13 1456  BP: 171/113 148/93  Pulse: 83 71  Temp: 97.9 F (36.6 C)   TempSrc: Oral   Resp: 14 18  SpO2: 100% 100%    Rechecks  4:00 PM = Dr. Claris Che at bedside. Priapism has resolved. Will follow-up with urology. Urology will write prescriptions.  4:11 PM = Patient dressed at bedside. No concerns. Ready for discharge. Prescriptions and discharge instructions provided by Dr. Dr. Claris Che.     Patient evaluated for priapism which resolved throughout ED visit after procedure performed by urology in ED. Labs unremarkable. HTN improved throughout ED visit. Urology follow-up.    New Prescriptions   No medications on file      Final impressions: 1. Priapism       Harold Hedge [place], [place] 06/01/13 [phone]

## 2013-06-01 NOTE — ED Notes (Signed)
Dr.Dahlstedt at bedside

## 2013-06-01 NOTE — Discharge Instructions (Signed)
Priapism Priapism is a persistent, often painful erection. It is the hardening of the penis in males and of the clitoris in females, even without sexual stimulation. Priapism may come on suddenly. Priapism may last a short while, or may last a long time. Priapism occurs in all ages. The types of priapism include:  Acute prolonged priapism Priapism that comes on suddenly and lasts.  Recurrent acute priapism Priapism that comes on suddenly and tends to happen again.  Chronic priapism Priapism that is persistent but with less of an erection. CAUSES  There are many causes. Causes include:  Blood problems common in people with the following diseases:  Sickle cell disease.  Leukemia.  Side effects of erectile dysfunction medicine. This is the most common cause of priapism.  Side effects of prescription medicine used in the treatment of depression and anxiety.  Illegal use of street drugs such as cocaine and marijuana.  Excessive use of alcohol.  Neurological problems such as multiple sclerosis.  Diabetes mellitus.  The cause may be unknown. SIGNS AND SYMPTOMS  A prolonged erection, usually without sexual stimulation or following the use of erectile dysfunction medicine.  A painful erection. DIAGNOSIS Diagnosis of priapism can usually be confirmed by your health care provider after a physical exam. Your health care provider may have blood tests done to search for a potential cause, such as leukemia or sickle cell disease. TREATMENT  Treatments depend on the cause. Some specific treatments include:  Oxygen and red blood cell transfusions in patients with sickle cell disease.  A special treatment for plasma in those with leukemia.  Removing blood that is trapped.  Treatment with medicine.  Surgical shunting (a passage that is made to allow blood to flow from one part of the body to another). HOME CARE INFORMATION  Avoid sexual stimulation and intercourse until your health  care provider says it is okay.  Avoid the use of alcohol or drugs to minimize recurrence of priapism. SEEK MEDICAL CARE IF:  You experience worsening pain instead of improvement. SEEK IMMEDIATE MEDICAL CARE IF:  You experience fever or shaking chills.  You experience pain, swelling, or redness in your genital or groin area. MAKE SURE YOU:  Understand these instructions.   Will watch your condition.  Will get help right away if you are not doing well or get worse. Document Released: 05/13/2003 Document Revised: 12/11/2012 Document Reviewed: 07/25/2012 [facility] Patient Information 2014 [place].

## 2013-06-01 NOTE — ED Provider Notes (Signed)
CSN: [id number]     Arrival date & time 06/01/13  1304 History   First MD Initiated Contact with Patient 06/01/13 1319     Chief Complaint  Patient presents with  . Personal Problem     (Consider location/radiation/quality/duration/timing/severity/associated sxs/prior Treatment) HPI Urologist; [place]  Bradley [name] presents to the ED with the complaint of priapism that started at midnight. He reports taking multiple doses of Sudafed and ice on his gentals without any improvement. He is now starting to have pain. He has had multiple episodes of priapism that is thought to be idiopathic. Sometimes the Sudafed works but when it does not he usually has to come to the ER for injections. His last ER visit was  On 05/18/2013 and after multiple injections into the penis, urology (DR. Grapey) had to be consulted for detumecence. HE denies knowing why today's episode has occurred.     Past Medical History  Diagnosis Date  . Hypertension   . Kidney stone   . Anxiety   . Depression   . Displacement of intervertebral disc, site unspecified, without myelopathy   . Other, mixed, or unspecified nondependent drug abuse, unspecified   . Priapism   . Arthropathy, unspecified, other specified sites   . Lumbago   . Contact with or exposure to venereal diseases   . Personal history of urinary calculi   . Obesity, unspecified    Past Surgical History  Procedure Laterality Date  . Gastric bypass    . Ankle surgery    . Knee surgery    . Hernia repair    . Panectomy    . Incision and drainage abscess Right 07/06/2004    great toe  . Irrigation and debridement abscess Right 07/08/2004    great toe  . Microdiscectomy lumbar Left 08/24/2008    T12 - L1   Family History  Problem Relation Age of Onset  . Arthritis Mother   . Heart disease Father   . Hypertension Father   . Kidney disease Father    History  Substance Use Topics  . Smoking status: Never Smoker   . Smokeless tobacco: Never Used   . Alcohol Use: No    Review of Systems  The patient denies anorexia, fever, weight loss,, vision loss, decreased hearing, hoarseness, chest pain, syncope, dyspnea on exertion, peripheral edema, balance deficits, hemoptysis, abdominal pain, melena, hematochezia, severe indigestion/heartburn, hematuria, incontinence, muscle weakness, suspicious skin lesions, transient blindness, difficulty walking, depression, unusual weight change, abnormal bleeding, enlarged lymph nodes, angioedema, and breast masses.   Allergies  Percocet  Home Medications   Current Outpatient Rx  Name  Route  Sig  Dispense  Refill  . HYDROcodone-acetaminophen (NORCO) 10-325 MG per tablet   Oral   Take 1 tablet by mouth every 4 (four) hours as needed for moderate pain.         . pseudoephedrine (SUDAFED) 60 MG tablet   Oral   Take 60 mg by mouth every 4 (four) hours as needed (priapism).           BP 171/113  Pulse 83  Temp(Src) 97.9 F (36.6 C) (Oral)  Resp 14  SpO2 100% Physical Exam  Nursing note and vitals reviewed. Constitutional: He appears well-developed and well-nourished. No distress.  HENT:  Head: Normocephalic and atraumatic.  Eyes: Pupils are equal, round, and reactive to light.  Neck: Normal range of motion. Neck supple.  Cardiovascular: Normal rate and regular rhythm.   Pulmonary/Chest: Effort normal.  Abdominal: Soft.  Genitourinary:  Testes normal. Penile tenderness present.  Priapism noted  Neurological: He is alert.  Skin: Skin is warm and dry.      ED Course  Procedures (including critical care time) Labs Review Labs Reviewed  CBC WITH DIFFERENTIAL - Abnormal; Notable for the following:    Hemoglobin 11.5 (*)    HCT 35.5 (*)    All other components within normal limits  BASIC METABOLIC PANEL   Imaging Review No results found.   EKG Interpretation None      MDM   Final diagnoses:  None    Dr Zenia Resides would prefer that I have Urology come see patient then to  attempt injection due to repeat need for urological intervention for priapism.   2:33pm I spoke with the Urologist- Dr. Eulogio Ditch who has agreed to come in to treat patients priapism. Pt has been updated. PRIAPISM KIT and phenylephrine at bedside.  Pt left with oncoming PA, await from Urology consult.  Bradley Mako, PA-C 06/01/13 1435

## 2013-06-01 NOTE — ED Notes (Signed)
[name], PA at bedside.

## 2013-06-03 ENCOUNTER — Emergency Department (HOSPITAL_COMMUNITY)
Admission: EM | Admit: 2013-06-03 | Discharge: 2013-06-03 | Disposition: A | Discharge status: Home / Self Care | Payer: 59 | Attending: Emergency Medicine | Admitting: Emergency Medicine

## 2013-06-03 ENCOUNTER — Encounter (HOSPITAL_COMMUNITY): Payer: Self-pay | Admitting: Emergency Medicine

## 2013-06-03 DIAGNOSIS — Z79899 Other long term (current) drug therapy: Secondary | ICD-10-CM | POA: Insufficient documentation

## 2013-06-03 DIAGNOSIS — Z9884 Bariatric surgery status: Secondary | ICD-10-CM | POA: Insufficient documentation

## 2013-06-03 DIAGNOSIS — Z8639 Personal history of other endocrine, nutritional and metabolic disease: Secondary | ICD-10-CM | POA: Insufficient documentation

## 2013-06-03 DIAGNOSIS — F3289 Other specified depressive episodes: Secondary | ICD-10-CM | POA: Insufficient documentation

## 2013-06-03 DIAGNOSIS — Z87442 Personal history of urinary calculi: Secondary | ICD-10-CM | POA: Insufficient documentation

## 2013-06-03 DIAGNOSIS — I10 Essential (primary) hypertension: Secondary | ICD-10-CM | POA: Insufficient documentation

## 2013-06-03 DIAGNOSIS — N4830 Priapism, unspecified: Secondary | ICD-10-CM | POA: Insufficient documentation

## 2013-06-03 DIAGNOSIS — Z862 Personal history of diseases of the blood and blood-forming organs and certain disorders involving the immune mechanism: Secondary | ICD-10-CM | POA: Insufficient documentation

## 2013-06-03 DIAGNOSIS — F411 Generalized anxiety disorder: Secondary | ICD-10-CM | POA: Insufficient documentation

## 2013-06-03 DIAGNOSIS — F329 Major depressive disorder, single episode, unspecified: Secondary | ICD-10-CM | POA: Insufficient documentation

## 2013-06-03 DIAGNOSIS — M129 Arthropathy, unspecified: Secondary | ICD-10-CM | POA: Insufficient documentation

## 2013-06-03 LAB — CBC WITH DIFFERENTIAL/PLATELET
Basophils Absolute: 0 10*3/uL (ref 0.0–0.1)
Basophils Relative: 0 % (ref 0–1)
Eosinophils Absolute: 0.1 10*3/uL (ref 0.0–0.7)
Eosinophils Relative: 2 % (ref 0–5)
HCT: 34.2 % — ABNORMAL LOW (ref 39.0–52.0)
Hemoglobin: 11.2 g/dL — ABNORMAL LOW (ref 13.0–17.0)
Lymphocytes Relative: 34 % (ref 12–46)
Lymphs Abs: 1.6 10*3/uL (ref 0.7–4.0)
MCH: 26.5 pg (ref 26.0–34.0)
MCHC: 32.7 g/dL (ref 30.0–36.0)
MCV: 80.9 fL (ref 78.0–100.0)
Monocytes Absolute: 0.6 10*3/uL (ref 0.1–1.0)
Monocytes Relative: 13 % — ABNORMAL HIGH (ref 3–12)
Neutro Abs: 2.4 10*3/uL (ref 1.7–7.7)
Neutrophils Relative %: 51 % (ref 43–77)
Platelets: 263 10*3/uL (ref 150–400)
RBC: 4.23 MIL/uL (ref 4.22–5.81)
RDW: 14.2 % (ref 11.5–15.5)
WBC: 4.8 10*3/uL (ref 4.0–10.5)

## 2013-06-03 LAB — URINALYSIS, ROUTINE W REFLEX MICROSCOPIC
Bilirubin Urine: NEGATIVE
Glucose, UA: NEGATIVE mg/dL
Ketones, ur: NEGATIVE mg/dL
Leukocytes, UA: NEGATIVE
Nitrite: NEGATIVE
Protein, ur: NEGATIVE mg/dL
Specific Gravity, Urine: 1.024 (ref 1.005–1.030)
Urobilinogen, UA: 0.2 mg/dL (ref 0.0–1.0)
pH: 6 (ref 5.0–8.0)

## 2013-06-03 LAB — URINE MICROSCOPIC-ADD ON

## 2013-06-03 LAB — RAPID URINE DRUG SCREEN, HOSP PERFORMED
Amphetamines: POSITIVE — AB
Barbiturates: NOT DETECTED
Benzodiazepines: NOT DETECTED
Cocaine: NOT DETECTED
Opiates: POSITIVE — AB
Tetrahydrocannabinol: NOT DETECTED

## 2013-06-03 MED ORDER — PHENYLEPHRINE 200 MCG/ML FOR PRIAPISM / HYPOTENSION
200.0000 ug | Freq: Once | INTRAMUSCULAR | Status: AC
  Administered 2013-06-03: 10:00:00 via INTRACAVERNOUS
  Filled 2013-06-03: qty 50

## 2013-06-03 MED ORDER — CEFTRIAXONE SODIUM 1 G IJ SOLR
1.0000 g | Freq: Once | INTRAMUSCULAR | Status: AC
  Administered 2013-06-03: 1 g via INTRAMUSCULAR
  Filled 2013-06-03: qty 10

## 2013-06-03 MED ORDER — TRAMADOL HCL 50 MG PO TABS: 50.0000 mg | ORAL_TABLET | Freq: Four times a day (QID) | ORAL | Status: DC | PRN

## 2013-06-03 MED ORDER — HYDROMORPHONE HCL PF 2 MG/ML IJ SOLN
2.0000 mg | Freq: Once | INTRAMUSCULAR | Status: AC
  Administered 2013-06-03: 2 mg via INTRAMUSCULAR
  Filled 2013-06-03: qty 1

## 2013-06-03 MED ORDER — LIDOCAINE HCL 1 % IJ SOLN
INTRAMUSCULAR | Status: AC
  Administered 2013-06-03: 20 mL
  Filled 2013-06-03: qty 20

## 2013-06-03 MED ORDER — PSEUDOEPHEDRINE HCL 60 MG PO TABS: 60.0000 mg | ORAL_TABLET | ORAL | Status: DC | PRN

## 2013-06-03 MED ORDER — PHENYLEPHRINE 200 MCG/ML FOR PRIAPISM / HYPOTENSION
200.0000 ug | INTRAMUSCULAR | Status: DC | PRN
  Filled 2013-06-03: qty 50

## 2013-06-03 NOTE — Consult Note (Signed)
Urology Consult  Referring physician: [name], MD Reason for referral:  priapism  Chief Complaint:  Recurrent priapism  History of Present Illness: HPI:       43 year old male with  Erection beginning at 2 AM resulting in priaptic episode. He took Sudafed at home, and, when this did not work, he was seen at [facility] ED at [facility] with injection x 2 with phenylephrine without detumescence. He was just in the [facility] ED 2 days ago for the same problem, and his treatment was 2-3 weeks ago. He has seen Dr. McDiarmid for this. He is on when necessary Sudafed. He does not have a history of myeloproliferative disorder or sickle cell. In the past, he had no associated medications with this. It was about a year ago. He had a period of time when he was not able to get an erection at all. He had a stone a month or 2 ago, was put on Flomax, and since then has had 2-3 episodes. He denies cocaine use. He does have a history of a gastric bypass, losing approximately 250 pounds, and has had a tummy tuck.    Past Medical History  Diagnosis Date  . Hypertension   . Kidney stone   . Anxiety   . Depression   . Displacement of intervertebral disc, site unspecified, without myelopathy   . Other, mixed, or unspecified nondependent drug abuse, unspecified   . Priapism   . Arthropathy, unspecified, other specified sites   . Lumbago   . Contact with or exposure to venereal diseases   . Personal history of urinary calculi   . Obesity, unspecified    Past Surgical History  Procedure Laterality Date  . Gastric bypass    . Ankle surgery    . Knee surgery    . Hernia repair    . Panectomy    . Incision and drainage abscess Right 07/06/2004    great toe  . Irrigation and debridement abscess Right 07/08/2004    great toe  . Microdiscectomy lumbar Left 08/24/2008    T12 - L1    Medications: I have reviewed the patient's current medications. Allergies:  Allergies  Allergen Reactions  . Percocet  [Oxycodone-Acetaminophen] Hives and Itching    Hives and itching    Family History  Problem Relation Age of Onset  . Arthritis Mother   . Heart disease Father   . Hypertension Father   . Kidney disease Father    Social History:  reports that he has never smoked. He has never used smokeless tobacco. He reports that he does not drink alcohol or use illicit drugs.  ROS: All systems are reviewed and negative except as noted. Recurrent stuttering priapism  Physical Exam:  Vital signs in last 24 hours: Temp:  [98.7 F (37.1 C)] 98.7 F (37.1 C) (03/31 0705) Pulse Rate:  [78] 78 (03/31 0705) Resp:  [18] 18 (03/31 0705) BP: (180)/(100) 180/100 mmHg (03/31 0705) SpO2:  [100 %] 100 % (03/31 0705)  Cardiovascular: Skin warm; not flushed Respiratory: Breaths quiet; no shortness of breath Abdomen: No masses Neurological: Normal sensation to touch Musculoskeletal: Normal motor function arms and legs Lymphatics: No inguinal adenopathy Skin: No rashes Genitourinary:  priopatic penis, circumcised. Painful. Scrotum wnl. Testes descended bilaterally.   Laboratory Data:  Results for orders placed during the hospital encounter of 06/01/13 (from the past 72 hour(s))  CBC WITH DIFFERENTIAL     Status: Abnormal   Collection Time    06/01/13  1:48 PM      Result Value Ref Range   WBC 7.3  4.0 - 10.5 K/uL   RBC 4.35  4.22 - 5.81 MIL/uL   Hemoglobin 11.5 (*) 13.0 - 17.0 g/dL   HCT 35.5 (*) 39.0 - 52.0 %   MCV 81.6  78.0 - 100.0 fL   MCH 26.4  26.0 - 34.0 pg   MCHC 32.4  30.0 - 36.0 g/dL   RDW 14.2  11.5 - 15.5 %   Platelets 255  150 - 400 K/uL   Neutrophils Relative % 65  43 - 77 %   Neutro Abs 4.7  1.7 - 7.7 K/uL   Lymphocytes Relative 25  12 - 46 %   Lymphs Abs 1.8  0.7 - 4.0 K/uL   Monocytes Relative 8  3 - 12 %   Monocytes Absolute 0.6  0.1 - 1.0 K/uL   Eosinophils Relative 1  0 - 5 %   Eosinophils Absolute 0.1  0.0 - 0.7 K/uL   Basophils Relative 0  0 - 1 %   Basophils Absolute  0.0  0.0 - 0.1 K/uL  BASIC METABOLIC PANEL     Status: None   Collection Time    06/01/13  1:48 PM      Result Value Ref Range   Sodium 139  137 - 147 mEq/L   Potassium 3.9  3.7 - 5.3 mEq/L   Chloride 104  96 - 112 mEq/L   CO2 23  19 - 32 mEq/L   Glucose, Bld 95  70 - 99 mg/dL   BUN 10  6 - 23 mg/dL   Creatinine, Ser 0.69  0.50 - 1.35 mg/dL   Calcium 8.9  8.4 - 10.5 mg/dL   GFR calc non Af Amer >90  >90 mL/min   GFR calc Af Amer >90  >90 mL/min   Comment: (NOTE)     The eGFR has been calculated using the CKD EPI equation.     This calculation has not been validated in all clinical situations.     eGFR's persistently <90 mL/min signify possible Chronic Kidney     Disease.   No results found for this or any previous visit (from the past 240 hour(s)). Creatinine:  Recent Labs  06/01/13 1348  CREATININE 0.69    Xrays: See report/chart PROCEDURE:  With the patient in the supine position, the penis is prepped with alcohol sponge and and 18G needle is inserted into the Right corpora and blood aspirated. Saline is injected and the corproa is irrigated. > 100cc-150cc blood is irrigated from the corpora , and then a sterile drressing is placed with coban around the penis. He is covered with rocephin. He is given a script for sudafed and tramadol.   Impression/Assessment:    Recurrent stutting priapism, despite sudafed at home, and despite taking cialis to try to increase his o2 in his corproa. I have explained that he needs to be sure he doesn't have any cocaine in his systiom and will ck his urine tox screen, and ck his CBC to R/o leukemia. He has a hx of UTI, and will have Rocephin 1 gram today.    He is encouraged to go to [place] to see Dr. Gareth Eagle, but alternatively, he  Could take Lupron to avoid erections ad try to preserve his future erectile function.   Plan:  Sudafed, tramadol. RTC Dr. Matilde Sprang Encourage to see Dr. Tonna Corner, Benny Henrie I 06/03/2013, 9:51 AM

## 2013-06-03 NOTE — ED Notes (Signed)
Patient has tried to urinate but cant

## 2013-06-03 NOTE — ED Notes (Signed)
Patient will call once he urinates

## 2013-06-03 NOTE — ED Notes (Signed)
Pt states he has a priapism since about 1am  Pt states he called [facility] and was told to have the dr on call called when he got here  Pt states the injections do not work for him and he Bradley [name] has to have it irrigated

## 2013-06-03 NOTE — ED Provider Notes (Signed)
CSN: [id number]     Arrival date & time 06/03/13  0700 History   First MD Initiated Contact with Patient 06/03/13 [phone]     Chief Complaint  Patient presents with  . priapism      (Consider location/radiation/quality/duration/timing/severity/associated sxs/prior Treatment) Patient is a 43 y.o. male presenting with male genitourinary complaint. The history is provided by the patient. No language interpreter was used.  Male GU Problem Presenting symptoms: penile pain   Presenting symptoms: no dysuria   Presenting symptoms comment:  Priapism since around 2am (5.5 hours)  Context: spontaneously   Relieved by:  Nothing Worsened by:  Nothing tried Ineffective treatments: ice packs, pseuophedrine. Associated symptoms: groin pain, penile swelling and priapism   Associated symptoms: no abdominal pain, no diarrhea, no fever, no genital itching, no genital lesions, no hematuria, no nausea, no penile redness, no scrotal swelling, no urinary frequency, no urinary retention and no vomiting   Priapism:    Pain severity:  Severe   Onset quality:  Unable to specify   Duration:  5 hours   Timing:  Constant   Progression:  Unchanged   Chronicity:  Recurrent Risk factors: change in medication (started low dose cialis as recomended by urology last night)   Risk factors: no sickle cell disease     Past Medical History  Diagnosis Date  . Hypertension   . Kidney stone   . Anxiety   . Depression   . Displacement of intervertebral disc, site unspecified, without myelopathy   . Other, mixed, or unspecified nondependent drug abuse, unspecified   . Priapism   . Arthropathy, unspecified, other specified sites   . Lumbago   . Contact with or exposure to venereal diseases   . Personal history of urinary calculi   . Obesity, unspecified    Past Surgical History  Procedure Laterality Date  . Gastric bypass    . Ankle surgery    . Knee surgery    . Hernia repair    . Panectomy    . Incision and  drainage abscess Right 07/06/2004    great toe  . Irrigation and debridement abscess Right 07/08/2004    great toe  . Microdiscectomy lumbar Left 08/24/2008    T12 - L1   Family History  Problem Relation Age of Onset  . Arthritis Mother   . Heart disease Father   . Hypertension Father   . Kidney disease Father    History  Substance Use Topics  . Smoking status: Never Smoker   . Smokeless tobacco: Never Used  . Alcohol Use: No    Review of Systems  Constitutional: Negative for fever, activity change, appetite change and fatigue.  HENT: Negative for congestion, facial swelling, rhinorrhea and trouble swallowing.   Eyes: Negative for photophobia and pain.  Respiratory: Negative for cough, chest tightness and shortness of breath.   Cardiovascular: Negative for chest pain and leg swelling.  Gastrointestinal: Negative for nausea, vomiting, abdominal pain, diarrhea and constipation.  Endocrine: Negative for polydipsia and polyuria.  Genitourinary: Positive for penile swelling and penile pain. Negative for dysuria, urgency, frequency, hematuria, decreased urine volume, scrotal swelling and difficulty urinating.  Musculoskeletal: Negative for back pain and gait problem.  Skin: Negative for color change, rash and wound.  Allergic/Immunologic: Negative for immunocompromised state.  Neurological: Negative for dizziness, facial asymmetry, speech difficulty, weakness, numbness and headaches.  Psychiatric/Behavioral: Negative for confusion, decreased concentration and agitation.      Allergies  Percocet  Home Medications   Current  Outpatient Rx  Name  Route  Sig  Dispense  Refill  . HYDROcodone-acetaminophen (NORCO) 10-325 MG per tablet   Oral   Take 1 tablet by mouth every 4 (four) hours as needed for moderate pain.         . Multiple Vitamin (MULTIVITAMIN WITH MINERALS) TABS tablet   Oral   Take 1 tablet by mouth daily.         . pseudoephedrine (SUDAFED) 60 MG tablet    Oral   Take 60 mg by mouth every 4 (four) hours as needed (priapism).          . tadalafil (CIALIS) 5 MG tablet   Oral   Take 5 mg by mouth every other day.         . pseudoephedrine (SUDAFED) 60 MG tablet   Oral   Take 1 tablet (60 mg total) by mouth every 4 (four) hours as needed (priapism).   30 tablet   0     For priapism   . traMADol (ULTRAM) 50 MG tablet   Oral   Take 1 tablet (50 mg total) by mouth every 6 (six) hours as needed.   15 tablet   0    BP 160/100  Pulse 72  Temp(Src) 98.7 F (37.1 C) (Oral)  Resp 19  SpO2 98% Physical Exam  Constitutional: He is oriented to person, place, and time. He appears well-developed and well-nourished. No distress.  HENT:  Head: Normocephalic and atraumatic.  Mouth/Throat: No oropharyngeal exudate.  Eyes: Pupils are equal, round, and reactive to light.  Neck: Normal range of motion. Neck supple.  Cardiovascular: Normal rate, regular rhythm and normal heart sounds.  Exam reveals no gallop and no friction rub.   No murmur heard. Pulmonary/Chest: Effort normal and breath sounds normal. No respiratory distress. He has no wheezes. He has no rales.  Abdominal: Soft. Bowel sounds are normal. He exhibits no distension and no mass. There is no tenderness. There is no rebound and no guarding.  Genitourinary: Penile tenderness present.  Firm, erect penis  Musculoskeletal: Normal range of motion. He exhibits no edema and no tenderness.  Neurological: He is alert and oriented to person, place, and time.  Skin: Skin is warm and dry.  Psychiatric: He has a normal mood and affect.    ED Course  Procedures (including critical care time) Labs Review Labs Reviewed  CBC WITH DIFFERENTIAL - Abnormal; Notable for the following:    Hemoglobin 11.2 (*)    HCT 34.2 (*)    Monocytes Relative 13 (*)    All other components within normal limits  URINALYSIS, ROUTINE W REFLEX MICROSCOPIC - Abnormal; Notable for the following:    Hgb urine  dipstick SMALL (*)    All other components within normal limits  URINE RAPID DRUG SCREEN (HOSP PERFORMED) - Abnormal; Notable for the following:    Opiates POSITIVE (*)    Amphetamines POSITIVE (*)    All other components within normal limits  URINE MICROSCOPIC-ADD ON - Abnormal; Notable for the following:    Bacteria, UA FEW (*)    All other components within normal limits  URINE CULTURE   Imaging Review No results found.   EKG Interpretation None     9:03 PM PRIAPISM TREATMENT (injection) Patient was prepped and draped in standard sterile fashion Just prior to procedure a timeout was performed Right corpus cavernosum was entered with a 25-gauge syringe at 2 o'clock Blood was aspirated 1 cc of 200 mcg/ml phenylephrine was  injected The patient tolerated the procedure No complications   9:29 PM PRIAPISM TREATMENT (injection) Patient was prepped and draped in standard sterile fashion Just prior to procedure a timeout was performed Left corpus cavernosum was entered with a 25-gauge syringe at 10 o'clock Blood was aspirated 2 cc of 200 mcg/ml phenylephrine was injected The patient tolerated the procedure No complications  MDM   Final diagnoses:  Priapism    Pt is a 43 y.o. male with Pmhx as above who presents with recurrent idopathic priapism since around 2am not relieved by two home doses of pseudofed taken at approx 3am and again at about 5:30am.  On PE, pt has firm, painful erection w/o discoloration. Pt hypertensive, but otherwise in NAD.  In reviewing previous recent visits including 3/15, and 3/29, pt failed phenylephrine injections and was irrigated by urology.  Pt only mildly improved after 2x 237mg phenylephrine injections and 2x 234mIM dilaudid. Dr. TaNichola Sizer/ [facility] consulted to see pt in ED for aspiration & irrigation. He has been offered leupron, but has declined due to side effects of the drug.  [facility]  Refill given for  pseudoephedrine.  Return precautions given for new or worsening symptoms including recurrent priapism.          MeNeta EhlersMD 06/03/13 [id number]

## 2013-06-03 NOTE — Discharge Instructions (Signed)
Priapism Priapism is a persistent, often painful erection. It is the hardening of the penis in males and of the clitoris in females, even without sexual stimulation. Priapism may come on suddenly. Priapism may last a short while, or may last a long time. Priapism occurs in all ages. The types of priapism include:  Acute prolonged priapism Priapism that comes on suddenly and lasts.  Recurrent acute priapism Priapism that comes on suddenly and tends to happen again.  Chronic priapism Priapism that is persistent but with less of an erection. CAUSES  There are many causes. Causes include:  Blood problems common in people with the following diseases:  Sickle cell disease.  Leukemia.  Side effects of erectile dysfunction medicine. This is the most common cause of priapism.  Side effects of prescription medicine used in the treatment of depression and anxiety.  Illegal use of street drugs such as cocaine and marijuana.  Excessive use of alcohol.  Neurological problems such as multiple sclerosis.  Diabetes mellitus.  The cause may be unknown. SIGNS AND SYMPTOMS  A prolonged erection, usually without sexual stimulation or following the use of erectile dysfunction medicine.  A painful erection. DIAGNOSIS Diagnosis of priapism can usually be confirmed by your health care provider after a physical exam. Your health care provider may have blood tests done to search for a potential cause, such as leukemia or sickle cell disease. TREATMENT  Treatments depend on the cause. Some specific treatments include:  Oxygen and red blood cell transfusions in patients with sickle cell disease.  A special treatment for plasma in those with leukemia.  Removing blood that is trapped.  Treatment with medicine.  Surgical shunting (a passage that is made to allow blood to flow from one part of the body to another). HOME CARE INFORMATION  Avoid sexual stimulation and intercourse until your health  care provider says it is okay.  Avoid the use of alcohol or drugs to minimize recurrence of priapism. SEEK MEDICAL CARE IF:  You experience worsening pain instead of improvement. SEEK IMMEDIATE MEDICAL CARE IF:  You experience fever or shaking chills.  You experience pain, swelling, or redness in your genital or groin area. MAKE SURE YOU:  Understand these instructions.   Will watch your condition.  Will get help right away if you are not doing well or get worse. Document Released: 05/13/2003 Document Revised: 12/11/2012 Document Reviewed: 07/25/2012 [facility] Patient Information 2014 [employer].

## 2013-06-05 LAB — URINE CULTURE

## 2014-01-28 ENCOUNTER — Emergency Department (HOSPITAL_COMMUNITY)
Admission: EM | Admit: 2014-01-28 | Discharge: 2014-01-28 | Disposition: A | Discharge status: Home / Self Care | Payer: 59 | Source: Home / Self Care | Attending: Emergency Medicine | Admitting: Emergency Medicine

## 2014-01-28 ENCOUNTER — Encounter (HOSPITAL_COMMUNITY): Payer: Self-pay | Admitting: Emergency Medicine

## 2014-01-28 ENCOUNTER — Emergency Department (INDEPENDENT_AMBULATORY_CARE_PROVIDER_SITE_OTHER): Payer: 59

## 2014-01-28 DIAGNOSIS — M79646 Pain in unspecified finger(s): Secondary | ICD-10-CM

## 2014-01-28 DIAGNOSIS — M79645 Pain in left finger(s): Secondary | ICD-10-CM

## 2014-01-28 MED ORDER — OXYCODONE-ACETAMINOPHEN 5-325 MG PO TABS: 2.0000 | ORAL_TABLET | ORAL | Status: DC | PRN

## 2014-01-28 MED ORDER — PREDNISONE 50 MG PO TABS: ORAL_TABLET | ORAL | Status: DC

## 2014-01-28 MED ORDER — SULFAMETHOXAZOLE-TRIMETHOPRIM 800-160 MG PO TABS: 1.0000 | ORAL_TABLET | Freq: Two times a day (BID) | ORAL | Status: AC

## 2014-01-28 NOTE — ED Notes (Signed)
43 year old male who states that he had a wart to his left little finger for a few days.  He states that last evening he applied Compound W to the site and went to bed.  At approx 1 am he woke up with level 10 pain to the site.  Describes the pain as burning and throbbing.  Finger is swollen so much that he cannot bend the finger.

## 2014-01-28 NOTE — ED Provider Notes (Signed)
CSN: [id number]     Arrival date & time 01/28/14  0800 History   First MD Initiated Contact with Patient 01/28/14 [phone]     Chief Complaint  Patient presents with  . Finger Injury   (Consider location/radiation/quality/duration/timing/severity/associated sxs/prior Treatment) HPI  He is a 43 year old man here for evaluation of left little finger injury. He states a few days ago he noticed what he thought was a wart coming up. Last night at 10 PM he applied Compound W liquid to the area. At 1 AM he woke up with the finger very painful and swollen. He states it is gradually getting worse. It feels warm to the touch.  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  No systemic symptoms. He states he has used Compound W multiple times in the past without problems.  Past Medical History  Diagnosis Date  . Hypertension   . Kidney stone   . Anxiety   . Depression   . Displacement of intervertebral disc, site unspecified, without myelopathy   . Other, mixed, or unspecified nondependent drug abuse, unspecified   . Priapism   . Arthropathy, unspecified, other specified sites   . Lumbago   . Contact with or exposure to venereal diseases   . Personal history of urinary calculi   . Obesity, unspecified    Past Surgical History  Procedure Laterality Date  . Gastric bypass    . Ankle surgery    . Knee surgery    . Hernia repair    . Panectomy    . Incision and drainage abscess Right 07/06/2004    great toe  . Irrigation and debridement abscess Right 07/08/2004    great toe  . Microdiscectomy lumbar Left 08/24/2008    T12 - L1  . Abdominal surgery    . Back surgery     Family History  Problem Relation Age of Onset  . Arthritis Mother   . Heart disease Father   . Hypertension Father   . Kidney disease Father    History  Substance Use Topics  . Smoking status: Never Smoker   . Smokeless tobacco: Never Used  . Alcohol Use: No    Review of Systems  HENT: Negative for  trouble swallowing.   Respiratory: Negative for shortness of breath.   Musculoskeletal:       Left little finger pain and swelling    Allergies  Percocet  Home Medications   Prior to Admission medications   Medication Sig Start Date End Date Taking? Authorizing Provider  HYDROcodone-acetaminophen (NORCO) 10-325 MG per tablet Take 1 tablet by mouth every 4 (four) hours as needed for moderate pain.    Historical Provider, MD  Multiple Vitamin (MULTIVITAMIN WITH MINERALS) TABS tablet Take 1 tablet by mouth daily.    Historical Provider, MD  oxyCODONE-acetaminophen (PERCOCET/ROXICET) 5-325 MG per tablet Take 2 tablets by mouth every 4 (four) hours as needed for severe pain. Take with benadryl to prevent itching 01/28/14   Melony Overly, MD  predniSONE (DELTASONE) 50 MG tablet Take 1 pill daily for 5 days 01/28/14   Melony Overly, MD  pseudoephedrine (SUDAFED) 60 MG tablet Take 60 mg by mouth every 4 (four) hours as needed (priapism).     Historical Provider, MD  pseudoephedrine (SUDAFED) 60 MG tablet Take 1 tablet (60 mg total) by mouth every 4 (four) hours as needed (priapism). 06/03/13   [name], MD  sulfamethoxazole-trimethoprim (BACTRIM DS,SEPTRA DS) 800-160 MG per tablet Take 1  tablet by mouth 2 (two) times daily. 01/28/14 02/04/14  Melony Overly, MD  tadalafil (CIALIS) 5 MG tablet Take 5 mg by mouth every other day.    Historical Provider, MD  traMADol (ULTRAM) 50 MG tablet Take 1 tablet (50 mg total) by mouth every 6 (six) hours as needed. 06/03/13   [name], MD   BP 171/124 mmHg  Pulse 68  Temp(Src) 98.2 F (36.8 C) (Oral)  Resp 16  SpO2 97% Physical Exam  Constitutional: He is oriented to person, place, and time. He appears well-developed and well-nourished. No distress.  Cardiovascular: Normal rate.   Pulmonary/Chest: Effort normal.  Neurological: He is alert and oriented to person, place, and time.  Skin:  Left little finger: hyperkeratotic lesion on volar aspect of  PIP joint with surround erythema and edema.  Slightly warm to touch.  Some patches of dry skin.    ED Course  Procedures (including critical care time) Labs Review Labs Reviewed - No data to display  Imaging Review Dg Finger Little Left  01/28/2014   CLINICAL DATA:  Pain and swelling at level of treated left fifth finger wart.  EXAM: LEFT LITTLE FINGER 2+V  COMPARISON:  None.  FINDINGS: There is no evidence of fracture or dislocation. There is no evidence of arthropathy or other focal bone abnormality. Soft tissues are unremarkable.  IMPRESSION: Negative.   Electronically Signed   By: Aletta Edouard M.D.   On: 01/28/2014 08:46     MDM   1. Pain in finger of left hand   2. Finger pain   3. Finger pain    No foreign body seen on x-ray. This is likely a reaction to the salicylic acid and Compound W. We'll treat with a five-day course of prednisone. Recommended Benadryl at night. Given the erythema, warmth, swelling we'll also treat presumptively with a 7 day course of Bactrim. Prescription for Percocet provided to use as needed for severe pain. If it is not improving by Friday, he will return for reevaluation. If it is acutely worsening, he should go to the emergency room.  His blood pressure is elevated, however he has significant pain in the finger. Recommend follow-up with primary care provider.    Melony Overly, MD 01/28/14 [phone]

## 2014-01-28 NOTE — Discharge Instructions (Signed)
This is likely a reaction to the Compound W. Ice the finger to help get the swelling out. Take prednisone 1 pill daily for the next 5 days to help with swelling. You can take bendryl at bedtime. Take Bactrim 1 pill twice a day for 1 week. Use Percocet as needed for pain.  If the finger is not looking better on Friday, please come back. If it is getting worse - the redness or swelling is moving into your hand - or you develop fevers, please go to the emergency room.

## 2014-04-08 ENCOUNTER — Other Ambulatory Visit: Payer: Self-pay | Admitting: Internal Medicine

## 2014-04-08 ENCOUNTER — Other Ambulatory Visit (INDEPENDENT_AMBULATORY_CARE_PROVIDER_SITE_OTHER): Payer: PRIVATE HEALTH INSURANCE

## 2014-04-08 ENCOUNTER — Encounter: Payer: Self-pay | Admitting: Internal Medicine

## 2014-04-08 ENCOUNTER — Ambulatory Visit (INDEPENDENT_AMBULATORY_CARE_PROVIDER_SITE_OTHER): Payer: PRIVATE HEALTH INSURANCE | Admitting: Internal Medicine

## 2014-04-08 VITALS — BP 140/90 | HR 81 | Temp 98.5°F | Resp 16 | Ht 71.0 in | Wt 274.0 lb

## 2014-04-08 DIAGNOSIS — D529 Folate deficiency anemia, unspecified: Secondary | ICD-10-CM

## 2014-04-08 DIAGNOSIS — D519 Vitamin B12 deficiency anemia, unspecified: Secondary | ICD-10-CM | POA: Insufficient documentation

## 2014-04-08 DIAGNOSIS — D509 Iron deficiency anemia, unspecified: Secondary | ICD-10-CM

## 2014-04-08 DIAGNOSIS — Z9884 Bariatric surgery status: Secondary | ICD-10-CM

## 2014-04-08 DIAGNOSIS — E60 Dietary zinc deficiency: Secondary | ICD-10-CM

## 2014-04-08 DIAGNOSIS — E559 Vitamin D deficiency, unspecified: Secondary | ICD-10-CM

## 2014-04-08 DIAGNOSIS — Z0000 Encounter for general adult medical examination without abnormal findings: Secondary | ICD-10-CM | POA: Diagnosis not present

## 2014-04-08 DIAGNOSIS — I10 Essential (primary) hypertension: Secondary | ICD-10-CM

## 2014-04-08 DIAGNOSIS — L02419 Cutaneous abscess of limb, unspecified: Secondary | ICD-10-CM

## 2014-04-08 DIAGNOSIS — L03119 Cellulitis of unspecified part of limb: Secondary | ICD-10-CM

## 2014-04-08 LAB — COMPREHENSIVE METABOLIC PANEL
ALT: 14 U/L (ref 0–53)
AST: 17 U/L (ref 0–37)
Albumin: 3.8 g/dL (ref 3.5–5.2)
Alkaline Phosphatase: 62 U/L (ref 39–117)
BUN: 13 mg/dL (ref 6–23)
CO2: 28 mEq/L (ref 19–32)
Calcium: 8.4 mg/dL (ref 8.4–10.5)
Chloride: 105 mEq/L (ref 96–112)
Creatinine, Ser: 0.77 mg/dL (ref 0.40–1.50)
GFR: 115.84 mL/min (ref >60.00)
Glucose, Bld: 92 mg/dL (ref 70–99)
Potassium: 4.5 mEq/L (ref 3.5–5.1)
Sodium: 138 mEq/L (ref 135–145)
Total Bilirubin: 0.3 mg/dL (ref 0.2–1.2)
Total Protein: 6.8 g/dL (ref 6.0–8.3)

## 2014-04-08 LAB — CBC WITH DIFFERENTIAL/PLATELET
Basophils Absolute: 0 10*3/uL (ref 0.0–0.1)
Basophils Relative: 0.4 % (ref 0.0–3.0)
Eosinophils Absolute: 0.2 10*3/uL (ref 0.0–0.7)
Eosinophils Relative: 1.7 % (ref 0.0–5.0)
HCT: 30.9 % — ABNORMAL LOW (ref 39.0–52.0)
Hemoglobin: 9.9 g/dL — ABNORMAL LOW (ref 13.0–17.0)
Lymphocytes Relative: 15.3 % (ref 12.0–46.0)
Lymphs Abs: 1.4 10*3/uL (ref 0.7–4.0)
MCHC: 32.1 g/dL (ref 30.0–36.0)
MCV: 71.2 fl — ABNORMAL LOW (ref 78.0–100.0)
Monocytes Absolute: 0.9 10*3/uL (ref 0.1–1.0)
Monocytes Relative: 9.8 % (ref 3.0–12.0)
Neutro Abs: 6.6 10*3/uL (ref 1.4–7.7)
Neutrophils Relative %: 72.8 % (ref 43.0–77.0)
Platelets: 266 10*3/uL (ref 150.0–400.0)
RBC: 4.34 Mil/uL (ref 4.22–5.81)
RDW: 16.6 % — ABNORMAL HIGH (ref 11.5–15.5)
WBC: 9 10*3/uL (ref 4.0–10.5)

## 2014-04-08 LAB — URINALYSIS, ROUTINE W REFLEX MICROSCOPIC
Bilirubin Urine: NEGATIVE
Ketones, ur: NEGATIVE
Nitrite: NEGATIVE
Specific Gravity, Urine: >=1.03 — AB (ref 1.000–1.030)
Total Protein, Urine: NEGATIVE
Urine Glucose: NEGATIVE
Urobilinogen, UA: 0.2 (ref 0.0–1.0)
pH: 6 (ref 5.0–8.0)

## 2014-04-08 LAB — LIPID PANEL
Cholesterol: 138 mg/dL (ref 0–200)
HDL: 38.8 mg/dL — ABNORMAL LOW (ref >39.00)
LDL Cholesterol: 81 mg/dL (ref 0–99)
NonHDL: 99.2
Total CHOL/HDL Ratio: 4
Triglycerides: 91 mg/dL (ref 0.0–149.0)
VLDL: 18.2 mg/dL (ref 0.0–40.0)

## 2014-04-08 LAB — TSH: TSH: 2.91 u[IU]/mL (ref 0.35–4.50)

## 2014-04-08 LAB — VITAMIN D 25 HYDROXY (VIT D DEFICIENCY, FRACTURES): VITD: 10.98 ng/mL — ABNORMAL LOW (ref 30.00–100.00)

## 2014-04-08 LAB — FOLATE: Folate: 5.1 ng/mL — ABNORMAL LOW (ref >5.9)

## 2014-04-08 LAB — IBC PANEL
Iron: 11 ug/dL — ABNORMAL LOW (ref 42–165)
Saturation Ratios: 2.3 % — ABNORMAL LOW (ref 20.0–50.0)
Transferrin: 341 mg/dL (ref 212.0–360.0)

## 2014-04-08 LAB — FERRITIN: Ferritin: 3.5 ng/mL — ABNORMAL LOW (ref 22.0–322.0)

## 2014-04-08 LAB — VITAMIN B12: Vitamin B-12: 245 pg/mL (ref 211–911)

## 2014-04-08 MED ORDER — CYANOCOBALAMIN 500 MCG/0.1ML NA SOLN: 1.0000 mL | NASAL | Status: DC

## 2014-04-08 MED ORDER — FOLIC ACID 1 MG PO TABS: 1.0000 mg | ORAL_TABLET | Freq: Every day | ORAL | Status: DC

## 2014-04-08 MED ORDER — CHOLECALCIFEROL 1.25 MG (50000 UT) PO TABS: 1.0000 | ORAL_TABLET | ORAL | Status: DC

## 2014-04-08 MED ORDER — FERRALET 90 90-1 MG PO TABS: 1.0000 | ORAL_TABLET | Freq: Every day | ORAL | Status: DC

## 2014-04-08 MED ORDER — SULFAMETHOXAZOLE-TRIMETHOPRIM 800-160 MG PO TABS: 2.0000 | ORAL_TABLET | Freq: Two times a day (BID) | ORAL | Status: AC

## 2014-04-08 NOTE — Assessment & Plan Note (Signed)
I will recheck his CBC and his iron level Will treat if indicated

## 2014-04-08 NOTE — Progress Notes (Signed)
Subjective:    Patient ID: Bradley [name], male    DOB: 09/08/1970, 44 y.o.   MRN: [id number]  HPI Comments: He comes in for a physical but he also complains of an area over his right upper thigh that has been red, painful, and draining for the last 5 days.  Hypertension This is a chronic problem. The current episode started more than 1 year ago. The problem is unchanged. The problem is controlled. Pertinent negatives include no anxiety, blurred vision, chest pain, headaches, malaise/fatigue, neck pain, orthopnea, palpitations, peripheral edema, PND, shortness of breath or sweats. Agents associated with hypertension include decongestants. Past treatments include lifestyle changes. The current treatment provides moderate improvement. There are no compliance problems.       Review of Systems  Constitutional: Negative.  Negative for fever, chills, malaise/fatigue, diaphoresis, appetite change and fatigue.  HENT: Negative.   Eyes: Negative.  Negative for blurred vision.  Respiratory: Negative.  Negative for cough, choking, chest tightness, shortness of breath and stridor.   Cardiovascular: Negative.  Negative for chest pain, palpitations, orthopnea, leg swelling and PND.  Gastrointestinal: Negative.  Negative for nausea, vomiting, abdominal pain, diarrhea, constipation and blood in stool.  Endocrine: Negative.   Genitourinary: Negative.   Musculoskeletal: Positive for arthralgias. Negative for myalgias, back pain, joint swelling and neck pain.  Skin: Negative.  Negative for rash.  Allergic/Immunologic: Negative.   Neurological: Negative.  Negative for headaches.  Hematological: Negative.  Negative for adenopathy. Does not bruise/bleed easily.  Psychiatric/Behavioral: Negative.        Objective:   Physical Exam  Constitutional: He is oriented to person, place, and time. He appears well-developed and well-nourished. No distress.  HENT:  Head: Normocephalic and atraumatic.    Mouth/Throat: Oropharynx is clear and moist. No oropharyngeal exudate.  Eyes: Conjunctivae are normal. Right eye exhibits no discharge. Left eye exhibits no discharge. No scleral icterus.  Neck: Normal range of motion. Neck supple. No JVD present. No tracheal deviation present. No thyromegaly present.  Cardiovascular: Normal rate, regular rhythm, S1 normal, S2 normal and intact distal pulses.  Exam reveals no gallop and no friction rub.   Murmur heard.  Systolic murmur is present   No diastolic murmur is present  1/6 RUSB SEM  Pulmonary/Chest: Effort normal and breath sounds normal. No stridor. No respiratory distress. He has no wheezes. He has no rales. He exhibits no tenderness.  Abdominal: Soft. Bowel sounds are normal. He exhibits no distension and no mass. There is no tenderness. There is no rebound and no guarding. Hernia confirmed negative in the right inguinal area and confirmed negative in the left inguinal area.  Genitourinary: Testes normal and penis normal. Right testis shows no mass, no swelling and no tenderness. Right testis is descended. Left testis shows no mass, no swelling and no tenderness. Left testis is descended. Circumcised. No penile erythema or penile tenderness. No discharge found.  Musculoskeletal: Normal range of motion. He exhibits no edema or tenderness.  Lymphadenopathy:    He has no cervical adenopathy.       Right: No inguinal adenopathy present.       Left: No inguinal adenopathy present.  Neurological: He is oriented to person, place, and time.  Skin: Skin is warm and dry. No rash noted. He is not diaphoretic. No erythema. No pallor.     Psychiatric: He has a normal mood and affect. His behavior is normal. Judgment and thought content normal.  Nursing note and vitals reviewed.  Lab Results  Component Value Date   WBC 4.8 06/03/2013   HGB 11.2* 06/03/2013   HCT 34.2* 06/03/2013   PLT 263 06/03/2013   GLUCOSE 95 06/01/2013   CHOL 113 07/25/2012    TRIG 40.0 07/25/2012   HDL 41.50 07/25/2012   LDLCALC 64 07/25/2012   ALT 23 07/25/2012   AST 24 07/25/2012   NA 139 06/01/2013   K 3.9 06/01/2013   CL 104 06/01/2013   CREATININE 0.69 06/01/2013   BUN 10 06/01/2013   CO2 23 06/01/2013   TSH 1.57 07/25/2012   HGBA1C 4.9 10/05/2009       Assessment & Plan:

## 2014-04-08 NOTE — Assessment & Plan Note (Signed)
I will check his labs to screen for complications and vitamin deficiencies

## 2014-04-08 NOTE — Assessment & Plan Note (Signed)
His BP is adequately well controlled

## 2014-04-08 NOTE — Assessment & Plan Note (Signed)
He refused a flu vax today Exam done Labs ordered  Pt ed material was given

## 2014-04-08 NOTE — Assessment & Plan Note (Signed)
This appears to be MRSA, will treat with high dose bactrim There does not appear to be a formed abscess that needs to be drained Will recheck this in 5-7 days

## 2014-04-08 NOTE — Patient Instructions (Signed)
Cellulitis Cellulitis is an infection of the skin and the tissue beneath it. The infected area is usually red and tender. Cellulitis occurs most often in the arms and lower legs.  CAUSES  Cellulitis is caused by bacteria that enter the skin through cracks or cuts in the skin. The most common types of bacteria that cause cellulitis are staphylococci and streptococci. SIGNS AND SYMPTOMS   Redness and warmth.  Swelling.  Tenderness or pain.  Fever. DIAGNOSIS  Your health care provider can usually determine what is wrong based on a physical exam. Blood tests may also be done. TREATMENT  Treatment usually involves taking an antibiotic medicine. HOME CARE INSTRUCTIONS   Take your antibiotic medicine as directed by your health care provider. Finish the antibiotic even if you start to feel better.  Keep the infected arm or leg elevated to reduce swelling.  Apply a warm cloth to the affected area up to 4 times per day to relieve pain.  Take medicines only as directed by your health care provider.  Keep all follow-up visits as directed by your health care provider. SEEK MEDICAL CARE IF:   You notice red streaks coming from the infected area.  Your red area gets larger or turns dark in color.  Your bone or joint underneath the infected area becomes painful after the skin has healed.  Your infection returns in the same area or another area.  You notice a swollen bump in the infected area.  You develop new symptoms.  You have a fever. SEEK IMMEDIATE MEDICAL CARE IF:   You feel very sleepy.  You develop vomiting or diarrhea.  You have a general ill feeling (malaise) with muscle aches and pains. MAKE SURE YOU:   Understand these instructions.  Will watch your condition.  Will get help right away if you are not doing well or get worse. Document Released: 11/30/2004 Document Revised: 07/07/2013 Document Reviewed: 05/08/2011 [facility] Patient Information 2015 [place], [place].  This information is not intended to replace advice given to you by your health care provider. Make sure you discuss any questions you have with your health care provider. Health Maintenance A healthy lifestyle and preventative care can promote health and wellness.  Maintain regular health, dental, and eye exams.  Eat a healthy diet. Foods like vegetables, fruits, whole grains, low-fat dairy products, and lean protein foods contain the nutrients you need and are low in calories. Decrease your intake of foods high in solid fats, added sugars, and salt. Get information about a proper diet from your health care provider, if necessary.  Regular physical exercise is one of the most important things you can do for your health. Most adults should get at least 150 minutes of moderate-intensity exercise (any activity that increases your heart rate and causes you to sweat) each week. In addition, most adults need muscle-strengthening exercises on 2 or more days a week.   Maintain a healthy weight. The body mass index (BMI) is a screening tool to identify possible weight problems. It provides an estimate of body fat based on height and weight. Your health care provider can find your BMI and can help you achieve or maintain a healthy weight. For males 20 years and older:  A BMI below 18.5 is considered underweight.  A BMI of 18.5 to 24.9 is normal.  A BMI of 25 to 29.9 is considered overweight.  A BMI of 30 and above is considered obese.  Maintain normal blood lipids and cholesterol by exercising and minimizing  your intake of saturated fat. Eat a balanced diet with plenty of fruits and vegetables. Blood tests for lipids and cholesterol should begin at age 4 and be repeated every 5 years. If your lipid or cholesterol levels are high, you are over age 60, or you are at high risk for heart disease, you may need your cholesterol levels checked more frequently.Ongoing high lipid and cholesterol levels should be  treated with medicines if diet and exercise are not working.  If you smoke, find out from your health care provider how to quit. If you do not use tobacco, do not start.  Lung cancer screening is recommended for adults aged 87-80 years who are at high risk for developing lung cancer because of a history of smoking. A yearly low-dose CT scan of the lungs is recommended for people who have at least a 30-pack-year history of smoking and are current smokers or have quit within the past 15 years. A pack year of smoking is smoking an average of 1 pack of cigarettes a day for 1 year (for example, a 30-pack-year history of smoking could mean smoking 1 pack a day for 30 years or 2 packs a day for 15 years). Yearly screening should continue until the smoker has stopped smoking for at least 15 years. Yearly screening should be stopped for people who develop a health problem that would prevent them from having lung cancer treatment.  If you choose to drink alcohol, do not have more than 2 drinks per day. One drink is considered to be 12 oz (360 mL) of beer, 5 oz (150 mL) of wine, or 1.5 oz (45 mL) of liquor.  Avoid the use of street drugs. Do not share needles with anyone. Ask for help if you need support or instructions about stopping the use of drugs.  High blood pressure causes heart disease and increases the risk of stroke. Blood pressure should be checked at least every 1-2 years. Ongoing high blood pressure should be treated with medicines if weight loss and exercise are not effective.  If you are 84-45 years old, ask your health care provider if you should take aspirin to prevent heart disease.  Diabetes screening involves taking a blood sample to check your fasting blood sugar level. This should be done once every 3 years after age 34 if you are at a normal weight and without risk factors for diabetes. Testing should be considered at a younger age or be carried out more frequently if you are overweight and  have at least 1 risk factor for diabetes.  Colorectal cancer can be detected and often prevented. Most routine colorectal cancer screening begins at the age of 78 and continues through age 4. However, your health care provider may recommend screening at an earlier age if you have risk factors for colon cancer. On a yearly basis, your health care provider may provide home test kits to check for hidden blood in the stool. A small camera at the end of a tube may be used to directly examine the colon (sigmoidoscopy or colonoscopy) to detect the earliest forms of colorectal cancer. Talk to your health care provider about this at age 59 when routine screening begins. A direct exam of the colon should be repeated every 5-10 years through age 66, unless early forms of precancerous polyps or small growths are found.  People who are at an increased risk for hepatitis B should be screened for this virus. You are considered at high risk for hepatitis  B if:  You were born in a country where hepatitis B occurs often. Talk with your health care provider about which countries are considered high risk.  Your parents were born in a high-risk country and you have not received a shot to protect against hepatitis B (hepatitis B vaccine).  You have HIV or AIDS.  You use needles to inject street drugs.  You live with, or have sex with, someone who has hepatitis B.  You are a man who has sex with other men (MSM).  You get hemodialysis treatment.  You take certain medicines for conditions like cancer, organ transplantation, and autoimmune conditions.  Hepatitis C blood testing is recommended for all people born from [birth year] through 1967 and any individual with known risk factors for hepatitis C.  Healthy men should no longer receive prostate-specific antigen (PSA) blood tests as part of routine cancer screening. Talk to your health care provider about prostate cancer screening.  Testicular cancer screening is not  recommended for adolescents or adult males who have no symptoms. Screening includes self-exam, a health care provider exam, and other screening tests. Consult with your health care provider about any symptoms you have or any concerns you have about testicular cancer.  Practice safe sex. Use condoms and avoid high-risk sexual practices to reduce the spread of sexually transmitted infections (STIs).  You should be screened for STIs, including gonorrhea and chlamydia if:  You are sexually active and are younger than 24 years.  You are older than 24 years, and your health care provider tells you that you are at risk for this type of infection.  Your sexual activity has changed since you were last screened, and you are at an increased risk for chlamydia or gonorrhea. Ask your health care provider if you are at risk.  If you are at risk of being infected with HIV, it is recommended that you take a prescription medicine daily to prevent HIV infection. This is called pre-exposure prophylaxis (PrEP). You are considered at risk if:  You are a man who has sex with other men (MSM).  You are a heterosexual man who is sexually active with multiple partners.  You take drugs by injection.  You are sexually active with a partner who has HIV.  Talk with your health care provider about whether you are at high risk of being infected with HIV. If you choose to begin PrEP, you should first be tested for HIV. You should then be tested every 3 months for as long as you are taking PrEP.  Use sunscreen. Apply sunscreen liberally and repeatedly throughout the day. You should seek shade when your shadow is shorter than you. Protect yourself by wearing long sleeves, pants, a wide-brimmed hat, and sunglasses year round whenever you are outdoors.  Tell your health care provider of new moles or changes in moles, especially if there is a change in shape or color. Also, tell your health care provider if a mole is larger  than the size of a pencil eraser.  A one-time screening for abdominal aortic aneurysm (AAA) and surgical repair of large AAAs by ultrasound is recommended for men aged 40-75 years who are current or former smokers.  Stay current with your vaccines (immunizations). Document Released: 08/19/2007 Document Revised: 02/25/2013 Document Reviewed: 07/18/2010 [facility] Patient Information 2015 [place], [place]. This information is not intended to replace advice given to you by your health care provider. Make sure you discuss any questions you have with your health care provider.

## 2014-04-08 NOTE — Assessment & Plan Note (Signed)
I will recheck his Vit D level, will treat if indicated

## 2014-04-08 NOTE — Progress Notes (Signed)
Pre visit review using our clinic review tool, if applicable. No additional management support is needed unless otherwise documented below in the visit note. 

## 2014-04-10 LAB — ZINC: Zinc: 40 ug/dL — ABNORMAL LOW (ref 60–130)

## 2014-04-11 LAB — VITAMIN B1: Vitamin B1 (Thiamine): 14 nmol/L (ref 8–30)

## 2014-04-12 ENCOUNTER — Encounter: Payer: Self-pay | Admitting: Internal Medicine

## 2014-04-12 DIAGNOSIS — E60 Dietary zinc deficiency: Secondary | ICD-10-CM | POA: Insufficient documentation

## 2014-04-12 MED ORDER — ZINC GLUCONATE 50 MG PO TABS: 50.0000 mg | ORAL_TABLET | Freq: Every day | ORAL | Status: DC

## 2014-04-12 NOTE — Addendum Note (Signed)
Addended by: [name] on: 04/12/2014 10:06 AM   Modules accepted: Orders

## 2014-04-16 LAB — VITAMIN B6: Vitamin B6: 12.5 ng/mL (ref 2.1–21.7)

## 2014-05-13 ENCOUNTER — Emergency Department (INDEPENDENT_AMBULATORY_CARE_PROVIDER_SITE_OTHER)
Admission: EM | Admit: 2014-05-13 | Discharge: 2014-05-13 | Disposition: A | Discharge status: Home / Self Care | Payer: PRIVATE HEALTH INSURANCE | Source: Home / Self Care

## 2014-05-13 ENCOUNTER — Encounter (HOSPITAL_COMMUNITY): Payer: Self-pay | Admitting: Emergency Medicine

## 2014-05-13 DIAGNOSIS — L03011 Cellulitis of right finger: Secondary | ICD-10-CM | POA: Diagnosis not present

## 2014-05-13 MED ORDER — DICLOFENAC SODIUM 75 MG PO TBEC: 75.0000 mg | DELAYED_RELEASE_TABLET | Freq: Two times a day (BID) | ORAL | Status: DC

## 2014-05-13 MED ORDER — CEPHALEXIN 500 MG PO CAPS: 500.0000 mg | ORAL_CAPSULE | Freq: Three times a day (TID) | ORAL | Status: DC

## 2014-05-13 NOTE — Discharge Instructions (Signed)
Your fingertip was infected. This was opened and allowed to drain Please take the antibiotics Please use the voltaren for pain relief You may return to work as able

## 2014-05-13 NOTE — ED Notes (Signed)
[name] right index pain/infection onset last night Reports he suffers from dry skin that cracks. Has been applying super glue to prevent it from cracking  Finger nail is turning purple; pain is constant and throbs  Alert, no signs of acute distress.

## 2014-05-13 NOTE — ED Provider Notes (Signed)
CSN: [id number]     Arrival date & time 05/13/14  0815 History   None    Chief Complaint  Patient presents with  . Hand Pain   (Consider location/radiation/quality/duration/timing/severity/associated sxs/prior Treatment) HPI  Patient presenting with one-day history of right index fingertip pain and swelling. 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 Patient did not clean the area but applied superglue as he typically does to try prevent further cracking of the skin. Over the course of the night the fingertip is becoming so increasingly and exquisitely tender to palpation red with swelling. Patient has tried cold water soaks and elevation without improvement. Patient denies chest pain, fevers, shortness of breath, palpitations, headache, nausea, vomiting. Sensation and movement are intact. The pain is constant. The problem is getting worse.   Past Medical History  Diagnosis Date  . Hypertension   . Kidney stone   . Anxiety   . Depression   . Displacement of intervertebral disc, site unspecified, without myelopathy   . Other, mixed, or unspecified nondependent drug abuse, unspecified   . Priapism   . Arthropathy, unspecified, other specified sites   . Lumbago   . Contact with or exposure to venereal diseases   . Personal history of urinary calculi   . Obesity, unspecified    Past Surgical History  Procedure Laterality Date  . Gastric bypass    . Ankle surgery    . Knee surgery    . Hernia repair    . Panectomy    . Incision and drainage abscess Right 07/06/2004    great toe  . Irrigation and debridement abscess Right 07/08/2004    great toe  . Microdiscectomy lumbar Left 08/24/2008    T12 - L1  . Abdominal surgery    . Back surgery     Family History  Problem Relation Age of Onset  . Arthritis Mother   . Heart disease Father   . Hypertension Father   . Kidney disease Father    History   Substance Use Topics  . Smoking status: Never Smoker   . Smokeless tobacco: Never Used  . Alcohol Use: No    Review of Systems Per HPI with all other pertinent systems negative.   Allergies  Percocet  Home Medications   Prior to Admission medications   Medication Sig Start Date End Date Taking? Authorizing Provider  folic acid (FOLVITE) 1 MG tablet Take 1 tablet (1 mg total) by mouth daily. 04/08/14  Yes [name], MD  Multiple Vitamin (MULTIVITAMIN WITH MINERALS) TABS tablet Take 1 tablet by mouth daily.   Yes Historical Provider, MD  zinc gluconate 50 MG tablet Take 1 tablet (50 mg total) by mouth daily. 04/12/14  Yes [name], MD  cephALEXin (KEFLEX) 500 MG capsule Take 1 capsule (500 mg total) by mouth 3 (three) times daily. 05/13/14   Waldemar Dickens, MD  Cholecalciferol 50000 UNITS TABS Take 1 tablet by mouth once a week. 04/08/14   [name], MD  Cyanocobalamin (NASCOBAL) 500 MCG/0.1ML SOLN Place 1 mL (5,000 mcg total) into the nose once a week. 04/08/14   [name], MD  diclofenac (VOLTAREN) 75 MG EC tablet Take 1 tablet (75 mg total) by mouth 2 (two) times daily. 05/13/14   Waldemar Dickens, MD  Fe Cbn-Fe Gluc-FA-B12-C-DSS (FERRALET 90) 90-1 MG TABS Take 1 tablet by mouth  daily. 04/08/14   [name], MD  HYDROcodone-acetaminophen [facility]) 10-325 MG per tablet Take 1 tablet by mouth every 4 (four) hours as needed for moderate pain.    Historical Provider, MD  pseudoephedrine (SUDAFED) 60 MG tablet Take 1 tablet (60 mg total) by mouth every 4 (four) hours as needed (priapism). 06/03/13   [name], MD   BP 178/102 mmHg  Pulse 69  Temp(Src) 98.1 F (36.7 C) (Oral)  Resp 16  SpO2 98% Physical Exam  Constitutional: He is oriented to person, place, and time. He appears well-developed and well-nourished.  HENT:  Head: Normocephalic and atraumatic.  Eyes: EOM are normal. Pupils are equal, round, and reactive to light.  Neck: Normal range of motion.   Cardiovascular: Normal rate and normal heart sounds.   No murmur heard. Pulmonary/Chest: Effort normal and breath sounds normal.  Abdominal: Soft. Bowel sounds are normal.  Musculoskeletal: Normal range of motion.  Right index finger with fingertip swelling and erythema. Tenderness at the end of the fingernail which has clearly been cut back shorter than where she typically should be extension into the nailbed area there is slight discoloration of the skin in this area.  Neurological: He is alert and oriented to person, place, and time.  Skin: Skin is warm. No rash noted.  Psychiatric: He has a normal mood and affect. His behavior is normal. Judgment and thought content normal.    ED Course  INCISION AND DRAINAGE Date/Time: 05/13/2014 8:55 AM Performed by: Marily Memos, Keyira Mondesir J Authorized by: Marily Memos, Shavonna Corella J Consent: Verbal consent obtained. Consent given by: patient Patient identity confirmed: verbally with patient Type: abscess Location: R index finger tip. Anesthesia: digital block Local anesthetic: lidocaine 2% without epinephrine Anesthetic total: 5 ml Patient sedated: no Needle gauge: 18 Incision type: single straight Complexity: simple Drainage: purulent Drainage amount: scant Wound treatment: wound left open   (including critical care time) Labs Review Labs Reviewed - No data to display  Imaging Review No results found.   MDM   1. Abscess of fingernail, right    Fingertip abscess drained.  Start on Keflex (5 days) Voltaren if needed for pain Wound care instructions given HTN: no h/o HTN. Likely elevated from Pain. F/u PCP PRN  Precautions given and all questions answered  Linna Darner, MD Family Medicine 05/13/2014, 9:04 AM      Waldemar Dickens, MD 05/13/14 [phone]

## 2014-05-15 NOTE — ED Notes (Signed)
waiting for final sensitivity report

## 2014-05-16 LAB — CULTURE, ROUTINE-ABSCESS: Gram Stain: NONE SEEN

## 2014-05-17 MED ORDER — SULFAMETHOXAZOLE-TRIMETHOPRIM 800-160 MG PO TABS: 1.0000 | ORAL_TABLET | Freq: Two times a day (BID) | ORAL | Status: DC

## 2014-05-18 ENCOUNTER — Telehealth (HOSPITAL_COMMUNITY): Payer: Self-pay | Admitting: *Deleted

## 2014-05-18 NOTE — ED Notes (Signed)
Abscess culture R index finger: abundant MRSA.  Treated with I and D and Keflex.  3/12  Message sent to Dr. Marily Memos.  3/13 He e-prescribed Bactrim DS to pt.'s pharmacy.  I called pt. Pt. verified x 2 and given result.  Pt. told he needs to stop the Keflex and take all of the Septra DS.  Pt. said the pharmacy just called him and he told them to put it away because he was already taking an antibiotic. I told him I would call the pharmacy back and tell them to hold it for him. I reviewed the [place] MRSA instructions with him.  [facility] notified. They said they had not put it away yet and will hold it for him. Roselyn Meier 05/18/2014

## 2014-06-27 ENCOUNTER — Encounter (HOSPITAL_COMMUNITY): Payer: Self-pay | Admitting: Emergency Medicine

## 2014-06-27 ENCOUNTER — Emergency Department (HOSPITAL_COMMUNITY)
Admission: EM | Admit: 2014-06-27 | Discharge: 2014-06-27 | Disposition: A | Discharge status: Home / Self Care | Payer: 59 | Attending: Emergency Medicine | Admitting: Emergency Medicine

## 2014-06-27 DIAGNOSIS — R112 Nausea with vomiting, unspecified: Secondary | ICD-10-CM | POA: Insufficient documentation

## 2014-06-27 DIAGNOSIS — N390 Urinary tract infection, site not specified: Secondary | ICD-10-CM | POA: Diagnosis not present

## 2014-06-27 DIAGNOSIS — E669 Obesity, unspecified: Secondary | ICD-10-CM | POA: Diagnosis not present

## 2014-06-27 DIAGNOSIS — Z87442 Personal history of urinary calculi: Secondary | ICD-10-CM | POA: Diagnosis not present

## 2014-06-27 DIAGNOSIS — M129 Arthropathy, unspecified: Secondary | ICD-10-CM | POA: Insufficient documentation

## 2014-06-27 DIAGNOSIS — R109 Unspecified abdominal pain: Secondary | ICD-10-CM

## 2014-06-27 DIAGNOSIS — I10 Essential (primary) hypertension: Secondary | ICD-10-CM | POA: Diagnosis not present

## 2014-06-27 DIAGNOSIS — Z792 Long term (current) use of antibiotics: Secondary | ICD-10-CM | POA: Insufficient documentation

## 2014-06-27 DIAGNOSIS — M549 Dorsalgia, unspecified: Secondary | ICD-10-CM | POA: Diagnosis present

## 2014-06-27 DIAGNOSIS — Z8659 Personal history of other mental and behavioral disorders: Secondary | ICD-10-CM | POA: Diagnosis not present

## 2014-06-27 DIAGNOSIS — Z79899 Other long term (current) drug therapy: Secondary | ICD-10-CM | POA: Diagnosis not present

## 2014-06-27 LAB — COMPREHENSIVE METABOLIC PANEL
ALT: 43 U/L (ref 0–53)
AST: 53 U/L — ABNORMAL HIGH (ref 0–37)
Albumin: 4 g/dL (ref 3.5–5.2)
Alkaline Phosphatase: 74 U/L (ref 39–117)
Anion gap: 10 (ref 5–15)
BUN: 21 mg/dL (ref 6–23)
CO2: 23 mmol/L (ref 19–32)
Calcium: 8.8 mg/dL (ref 8.4–10.5)
Chloride: 106 mmol/L (ref 96–112)
Creatinine, Ser: 0.9 mg/dL (ref 0.50–1.35)
GFR calc Af Amer: >90 mL/min (ref >90)
GFR calc non Af Amer: >90 mL/min (ref >90)
Glucose, Bld: 94 mg/dL (ref 70–99)
Potassium: 2.9 mmol/L — ABNORMAL LOW (ref 3.5–5.1)
Sodium: 139 mmol/L (ref 135–145)
Total Bilirubin: 0.8 mg/dL (ref 0.3–1.2)
Total Protein: 7.6 g/dL (ref 6.0–8.3)

## 2014-06-27 LAB — URINALYSIS, ROUTINE W REFLEX MICROSCOPIC
Glucose, UA: NEGATIVE mg/dL
Leukocytes, UA: NEGATIVE
Nitrite: NEGATIVE
Protein, ur: 30 mg/dL — AB
Specific Gravity, Urine: >1.03 — ABNORMAL HIGH (ref 1.005–1.030)
Urobilinogen, UA: 0.2 mg/dL (ref 0.0–1.0)
pH: 5.5 (ref 5.0–8.0)

## 2014-06-27 LAB — CBC
HCT: 34.6 % — ABNORMAL LOW (ref 39.0–52.0)
Hemoglobin: 10.6 g/dL — ABNORMAL LOW (ref 13.0–17.0)
MCH: 22.2 pg — ABNORMAL LOW (ref 26.0–34.0)
MCHC: 30.6 g/dL (ref 30.0–36.0)
MCV: 72.5 fL — ABNORMAL LOW (ref 78.0–100.0)
Platelets: 354 10*3/uL (ref 150–400)
RBC: 4.77 MIL/uL (ref 4.22–5.81)
RDW: 16.6 % — ABNORMAL HIGH (ref 11.5–15.5)
WBC: 8.9 10*3/uL (ref 4.0–10.5)

## 2014-06-27 LAB — URINE MICROSCOPIC-ADD ON

## 2014-06-27 MED ORDER — POTASSIUM CHLORIDE CRYS ER 20 MEQ PO TBCR
40.0000 meq | EXTENDED_RELEASE_TABLET | Freq: Once | ORAL | Status: AC
  Administered 2014-06-27: 40 meq via ORAL
  Filled 2014-06-27: qty 2

## 2014-06-27 MED ORDER — NAPROXEN 250 MG PO TABS: 250.0000 mg | ORAL_TABLET | Freq: Two times a day (BID) | ORAL | Status: DC

## 2014-06-27 MED ORDER — ONDANSETRON HCL 4 MG/2ML IJ SOLN
4.0000 mg | Freq: Once | INTRAMUSCULAR | Status: AC
  Administered 2014-06-27: 4 mg via INTRAVENOUS
  Filled 2014-06-27: qty 2

## 2014-06-27 MED ORDER — MORPHINE SULFATE 4 MG/ML IJ SOLN
4.0000 mg | Freq: Once | INTRAMUSCULAR | Status: AC
  Administered 2014-06-27: 4 mg via INTRAVENOUS
  Filled 2014-06-27: qty 1

## 2014-06-27 MED ORDER — ONDANSETRON 4 MG PO TBDP: 4.0000 mg | ORAL_TABLET | Freq: Three times a day (TID) | ORAL | Status: DC | PRN

## 2014-06-27 MED ORDER — CEPHALEXIN 500 MG PO CAPS: 500.0000 mg | ORAL_CAPSULE | Freq: Four times a day (QID) | ORAL | Status: DC

## 2014-06-27 MED ORDER — KETOROLAC TROMETHAMINE 30 MG/ML IJ SOLN
15.0000 mg | Freq: Once | INTRAMUSCULAR | Status: AC
  Administered 2014-06-27: 15 mg via INTRAVENOUS
  Filled 2014-06-27: qty 1

## 2014-06-27 NOTE — ED Provider Notes (Signed)
CSN: [id number]     Arrival date & time 06/27/14  0439 History   First MD Initiated Contact with Patient 06/27/14 [phone]     Chief Complaint  Patient presents with  . Back Pain   Bradley [name] is a 44 y.o. male with a history of kidney stones presents to the emergency room complaining of right flank pain ongoing for the past 12 hours. Patient reports he started developing right flank pain last night that he describes as stabbing and will fluctuate in intensity. Patient reports he is also having nausea, vomiting and diarrhea since last night. Patient reports this pain feels like his previous kidney stones. He reports his last concern is approximately 1 year ago. Patient reports he is followed by urologist Dr. Vikki Ports. Patient denies ever requiring since her lithotripsy. Patient currently rates his pain at 9 out of 10 and describes it as stabbing. He reports some dysuria earlier with urination. The patient denies fevers, chills, urinary 50, urinary urgency, hematuria, hematochezia, hematemesis, or abdominal pain.  (Consider location/radiation/quality/duration/timing/severity/associated sxs/prior Treatment) HPI  Past Medical History  Diagnosis Date  . Hypertension   . Kidney stone   . Anxiety   . Depression   . Displacement of intervertebral disc, site unspecified, without myelopathy   . Other, mixed, or unspecified nondependent drug abuse, unspecified   . Priapism   . Arthropathy, unspecified, other specified sites   . Lumbago   . Contact with or exposure to venereal diseases   . Personal history of urinary calculi   . Obesity, unspecified    Past Surgical History  Procedure Laterality Date  . Gastric bypass    . Ankle surgery    . Knee surgery    . Hernia repair    . Panectomy    . Incision and drainage abscess Right 07/06/2004    great toe  . Irrigation and debridement abscess Right 07/08/2004    great toe  . Microdiscectomy lumbar Left 08/24/2008    T12 - L1  . Abdominal surgery     . Back surgery     Family History  Problem Relation Age of Onset  . Arthritis Mother   . Heart disease Father   . Hypertension Father   . Kidney disease Father    History  Substance Use Topics  . Smoking status: Never Smoker   . Smokeless tobacco: Never Used  . Alcohol Use: No    Review of Systems  Constitutional: Negative for fever and chills.  HENT: Negative for congestion and sore throat.   Eyes: Negative for visual disturbance.  Respiratory: Negative for cough, shortness of breath and wheezing.   Cardiovascular: Negative for chest pain and palpitations.  Gastrointestinal: Positive for nausea, vomiting and diarrhea. Negative for abdominal pain and blood in stool.  Genitourinary: Positive for dysuria and flank pain. Negative for urgency, frequency, hematuria and difficulty urinating.  Musculoskeletal: Negative for back pain and neck pain.  Skin: Negative for rash.  Neurological: Negative for light-headedness and headaches.       Allergies  Percocet  Home Medications   Prior to Admission medications   Medication Sig Start Date End Date Taking? Authorizing Provider  acetaminophen (TYLENOL) 500 MG tablet Take 1,000 mg by mouth every 6 (six) hours as needed for mild pain.   Yes Historical Provider, MD  Fe Cbn-Fe Gluc-FA-B12-C-DSS (FERRALET 90) 90-1 MG TABS Take 1 tablet by mouth daily. 04/08/14  Yes [name], MD  folic acid (FOLVITE) 1 MG tablet Take 1 tablet (  1 mg total) by mouth daily. 04/08/14  Yes [name], MD  HYDROcodone-acetaminophen (NORCO) 10-325 MG per tablet Take 1 tablet by mouth every 4 (four) hours as needed for moderate pain.   Yes Historical Provider, MD  ibuprofen (ADVIL,MOTRIN) 200 MG tablet Take 800 mg by mouth every 6 (six) hours as needed for moderate pain.   Yes Historical Provider, MD  Multiple Vitamin (MULTIVITAMIN WITH MINERALS) TABS tablet Take 1 tablet by mouth daily.   Yes Historical Provider, MD  zinc gluconate 50 MG tablet Take 1  tablet (50 mg total) by mouth daily. 04/12/14  Yes [name], MD  cephALEXin (KEFLEX) 500 MG capsule Take 1 capsule (500 mg total) by mouth 4 (four) times daily. 06/27/14   Waynetta Pean, PA-C  Cholecalciferol 50000 UNITS TABS Take 1 tablet by mouth once a week. 04/08/14   [name], MD  Cyanocobalamin (NASCOBAL) 500 MCG/0.1ML SOLN Place 1 mL (5,000 mcg total) into the nose once a week. 04/08/14   [name], MD  diclofenac (VOLTAREN) 75 MG EC tablet Take 1 tablet (75 mg total) by mouth 2 (two) times daily. Patient taking differently: Take 75 mg by mouth 2 (two) times daily as needed for mild pain.  05/13/14   Waldemar Dickens, MD  naproxen (NAPROSYN) 250 MG tablet Take 1 tablet (250 mg total) by mouth 2 (two) times daily with a meal. 06/27/14   Waynetta Pean, PA-C  ondansetron (ZOFRAN ODT) 4 MG disintegrating tablet Take 1 tablet (4 mg total) by mouth every 8 (eight) hours as needed for nausea or vomiting. 06/27/14   Waynetta Pean, PA-C  pseudoephedrine (SUDAFED) 60 MG tablet Take 1 tablet (60 mg total) by mouth every 4 (four) hours as needed (priapism). 06/03/13   [name], MD  sulfamethoxazole-trimethoprim (BACTRIM DS,SEPTRA DS) 800-160 MG per tablet Take 1 tablet by mouth 2 (two) times daily. Patient not taking: Reported on 06/27/2014 05/17/14   Waldemar Dickens, MD   BP 150/75 mmHg  Pulse 62  Temp(Src) 98 F (36.7 C) (Oral)  Resp 16  Ht 5' 10"  (1.778 m)  Wt 245 lb (111.131 kg)  BMI 35.15 kg/m2  SpO2 100% Physical Exam  Constitutional: He is oriented to person, place, and time. He appears well-developed and well-nourished. No distress.  Nontoxic appearing.  HENT:  Head: Normocephalic and atraumatic.  Mouth/Throat: Oropharynx is clear and moist. No oropharyngeal exudate.  Eyes: Conjunctivae are normal. Pupils are equal, round, and reactive to light. Right eye exhibits no discharge. Left eye exhibits no discharge.  Neck: Neck supple. No JVD present.  Cardiovascular: Normal rate,  regular rhythm, normal heart sounds and intact distal pulses.  Exam reveals no gallop and no friction rub.   No murmur heard. Pulmonary/Chest: Effort normal and breath sounds normal. No respiratory distress. He has no wheezes. He has no rales.  Abdominal: Soft. Bowel sounds are normal. He exhibits no distension and no mass. There is no tenderness. There is no rebound and no guarding.  Abdomen is soft and nontender to palpation. Bowel sounds are present. Patient has right flank tenderness.  Genitourinary: Prostate normal.   Digital rectal exam performed by me with chaperone. There is no tender or boggy prostate.   Musculoskeletal: He exhibits no edema or tenderness.  Lymphadenopathy:    He has no cervical adenopathy.  Neurological: He is alert and oriented to person, place, and time. Coordination normal.  Skin: Skin is warm and dry. No rash noted. He is not  diaphoretic. No erythema. No pallor.  Psychiatric: He has a normal mood and affect. His behavior is normal.  Nursing note and vitals reviewed.   ED Course  Procedures (including critical care time) Labs Review Labs Reviewed  URINALYSIS, ROUTINE W REFLEX MICROSCOPIC - Abnormal; Notable for the following:    Specific Gravity, Urine >1.030 (*)    Hgb urine dipstick TRACE (*)    Bilirubin Urine SMALL (*)    Ketones, ur TRACE (*)    Protein, ur 30 (*)    All other components within normal limits  CBC - Abnormal; Notable for the following:    Hemoglobin 10.6 (*)    HCT 34.6 (*)    MCV 72.5 (*)    MCH 22.2 (*)    RDW 16.6 (*)    All other components within normal limits  COMPREHENSIVE METABOLIC PANEL - Abnormal; Notable for the following:    Potassium 2.9 (*)    AST 53 (*)    All other components within normal limits  URINE MICROSCOPIC-ADD ON    Imaging Review No results found.   EKG Interpretation None      Filed Vitals:   06/27/14 0443 06/27/14 0727  BP: 159/96 150/75  Pulse: 60 62  Temp: 98 F (36.7 C)   TempSrc:  Oral   Resp: 16 16  Height: 5' 10"  (1.778 m)   Weight: 245 lb (111.131 kg)   SpO2: 100% 100%     MDM   Meds given in ED:  Medications  ondansetron (ZOFRAN) injection 4 mg (4 mg Intravenous Given 06/27/14 0636)  morphine 4 MG/ML injection 4 mg (4 mg Intravenous Given 06/27/14 0637)  ketorolac (TORADOL) 30 MG/ML injection 15 mg (15 mg Intravenous Given 06/27/14 0737)  potassium chloride SA (K-DUR,KLOR-CON) CR tablet 40 mEq (40 mEq Oral Given 06/27/14 0750)    Discharge Medication List as of 06/27/2014  7:28 AM    START taking these medications   Details  naproxen (NAPROSYN) 250 MG tablet Take 1 tablet (250 mg total) by mouth 2 (two) times daily with a meal., Starting 06/27/2014, Until Discontinued, Print    ondansetron (ZOFRAN ODT) 4 MG disintegrating tablet Take 1 tablet (4 mg total) by mouth every 8 (eight) hours as needed for nausea or vomiting., Starting 06/27/2014, Until Discontinued, Print      Keflex also started.   Final diagnoses:  UTI (lower urinary tract infection)  Right flank pain   This is a 44 y.o. male with a history of kidney stones presents to the emergency room complaining of right flank pain ongoing for the past 12 hours. Patient reports he started developing right flank pain last night that he describes as stabbing and will fluctuate in intensity. Patient reports he is also having nausea, vomiting and diarrhea since last night. Patient reports this pain feels like his previous kidney stones.  On exam the patient is afebrile and nontoxic appearing. The patient's abdomen is soft and nontender to palpation. Patient has mild right flank tenderness. On digital rectal exam his prostate is nontender and non-boggy.  The patient's blood work does not show signs of the obstructive kidney stone and the patient only has trace hemoglobin in his urine. Patient appears to have a urinary tract infection. We'll treat his UTI with Keflex. The patient's potassium is 2.9.  His creatinine is  normal. Patient provided with oral potassium prior to discharge. Prior to discharge the patient reports his pain is down to a 4 out of 10 and he feels  ready for discharge. Patient given prescription for Naprosyn, Zofran and Keflex.  Advised patient to follow-up with his urologist this week. I advised the patient to follow-up with their primary care provider this week. I advised the patient to return to the emergency department with new or worsening symptoms or new concerns. The patient verbalized understanding and agreement with plan.   This patient was discussed with Dr. Sharol Given who agrees with assessment and plan.    Waynetta Pean, PA-C 06/27/14 7253  Linton Flemings, MD 06/27/14 [phone]

## 2014-06-27 NOTE — ED Notes (Signed)
Pt arrived to the ED with a complaint of right sided back pain.  Pt has a hx of kidney stones and states the ain is similar to his previous experience.  Pt states that he has tried OTC medications without relief.  Pt states he has had episodes of nausea.  Pt is a&O x4.  Pt appears in no apparent distress.

## 2014-06-27 NOTE — Discharge Instructions (Signed)
Urinary Tract Infection Urinary tract infections (UTIs) can develop anywhere along your urinary tract. Your urinary tract is your body's drainage system for removing wastes and extra water. Your urinary tract includes two kidneys, two ureters, a bladder, and a urethra. Your kidneys are a pair of bean-shaped organs. Each kidney is about the size of your fist. They are located below your ribs, one on each side of your spine. CAUSES Infections are caused by microbes, which are microscopic organisms, including fungi, viruses, and bacteria. These organisms are so small that they can only be seen through a microscope. Bacteria are the microbes that most commonly cause UTIs. SYMPTOMS  Symptoms of UTIs may vary by age and gender of the patient and by the location of the infection. Symptoms in young women typically include a frequent and intense urge to urinate and a painful, burning feeling in the bladder or urethra during urination. Older women and men are more likely to be tired, shaky, and weak and have muscle aches and abdominal pain. A fever may mean the infection is in your kidneys. Other symptoms of a kidney infection include pain in your back or sides below the ribs, nausea, and vomiting. DIAGNOSIS To diagnose a UTI, your caregiver will ask you about your symptoms. Your caregiver also will ask to provide a urine sample. The urine sample will be tested for bacteria and white blood cells. White blood cells are made by your body to help fight infection. TREATMENT  Typically, UTIs can be treated with medication. Because most UTIs are caused by a bacterial infection, they usually can be treated with the use of antibiotics. The choice of antibiotic and length of treatment depend on your symptoms and the type of bacteria causing your infection. HOME CARE INSTRUCTIONS  If you were prescribed antibiotics, take them exactly as your caregiver instructs you. Finish the medication even if you feel better after you  have only taken some of the medication.  Drink enough water and fluids to keep your urine clear or pale yellow.  Avoid caffeine, tea, and carbonated beverages. They tend to irritate your bladder.  Empty your bladder often. Avoid holding urine for long periods of time.  Empty your bladder before and after sexual intercourse.  After a bowel movement, women should cleanse from front to back. Use each tissue only once. SEEK MEDICAL CARE IF:   You have back pain.  You develop a fever.  Your symptoms do not begin to resolve within 3 days. SEEK IMMEDIATE MEDICAL CARE IF:   You have severe back pain or lower abdominal pain.  You develop chills.  You have nausea or vomiting.  You have continued burning or discomfort with urination. MAKE SURE YOU:   Understand these instructions.  Will watch your condition.  Will get help right away if you are not doing well or get worse. Document Released: 11/30/2004 Document Revised: 08/22/2011 Document Reviewed: 03/31/2011 [facility] Patient Information 2015 [place], [place]. This information is not intended to replace advice given to you by your health care provider. Make sure you discuss any questions you have with your health care provider. Flank Pain Flank pain refers to pain that is located on the side of the body between the upper abdomen and the back. The pain may occur over a short period of time (acute) or may be long-term or reoccurring (chronic). It may be mild or severe. Flank pain can be caused by many things. CAUSES  Some of the more common causes of flank pain include:  Muscle strains.   Muscle spasms.   A disease of your spine (vertebral disk disease).   A lung infection (pneumonia).   Fluid around your lungs (pulmonary edema).   A kidney infection.   Kidney stones.   A very painful skin rash caused by the chickenpox virus (shingles).   Gallbladder disease.  [place] care will depend on the  cause of your pain. In general,  Rest as directed by your caregiver.  Drink enough fluids to keep your urine clear or pale yellow.  Only take over-the-counter or prescription medicines as directed by your caregiver. Some medicines may help relieve the pain.  Tell your caregiver about any changes in your pain.  Follow up with your caregiver as directed. SEEK IMMEDIATE MEDICAL CARE IF:   Your pain is not controlled with medicine.   You have new or worsening symptoms.  Your pain increases.   You have abdominal pain.   You have shortness of breath.   You have persistent nausea or vomiting.   You have swelling in your abdomen.   You feel faint or pass out.   You have blood in your urine.  You have a fever or persistent symptoms for more than 2-3 days.  You have a fever and your symptoms suddenly get worse. MAKE SURE YOU:   Understand these instructions.  Will watch your condition.  Will get help right away if you are not doing well or get worse. Document Released: 04/13/2005 Document Revised: 11/15/2011 Document Reviewed: 10/05/2011 [facility] Patient Information 2015 [place], [place]. This information is not intended to replace advice given to you by your health care provider. Make sure you discuss any questions you have with your health care provider.

## 2014-07-17 ENCOUNTER — Other Ambulatory Visit (HOSPITAL_COMMUNITY): Payer: Self-pay | Admitting: Neurosurgery

## 2014-07-17 DIAGNOSIS — M5417 Radiculopathy, lumbosacral region: Secondary | ICD-10-CM

## 2014-07-24 ENCOUNTER — Ambulatory Visit (HOSPITAL_COMMUNITY): Admission: RE | Admit: 2014-07-24 | Payer: PRIVATE HEALTH INSURANCE | Source: Ambulatory Visit

## 2014-07-28 ENCOUNTER — Ambulatory Visit (HOSPITAL_COMMUNITY)
Admission: RE | Admit: 2014-07-28 | Discharge: 2014-07-28 | Disposition: A | Discharge status: Home / Self Care | Payer: 59 | Source: Ambulatory Visit | Attending: Neurosurgery | Admitting: Neurosurgery

## 2014-07-28 DIAGNOSIS — M5126 Other intervertebral disc displacement, lumbar region: Secondary | ICD-10-CM | POA: Insufficient documentation

## 2014-07-28 DIAGNOSIS — M4806 Spinal stenosis, lumbar region: Secondary | ICD-10-CM | POA: Diagnosis not present

## 2014-07-28 DIAGNOSIS — M79605 Pain in left leg: Secondary | ICD-10-CM | POA: Diagnosis not present

## 2014-07-28 DIAGNOSIS — M5417 Radiculopathy, lumbosacral region: Secondary | ICD-10-CM | POA: Insufficient documentation

## 2014-07-28 DIAGNOSIS — M545 Low back pain: Secondary | ICD-10-CM | POA: Diagnosis not present

## 2014-07-28 MED ORDER — GADOBENATE DIMEGLUMINE 529 MG/ML IV SOLN
20.0000 mL | Freq: Once | INTRAVENOUS | Status: AC | PRN
  Administered 2014-07-28: 20 mL via INTRAVENOUS

## 2014-09-29 ENCOUNTER — Ambulatory Visit (INDEPENDENT_AMBULATORY_CARE_PROVIDER_SITE_OTHER): Payer: 59 | Admitting: Urgent Care

## 2014-09-29 VITALS — BP 178/110 | HR 65 | Temp 98.1°F | Resp 15 | Ht 68.75 in | Wt 236.0 lb

## 2014-09-29 DIAGNOSIS — I10 Essential (primary) hypertension: Secondary | ICD-10-CM

## 2014-09-29 DIAGNOSIS — R8290 Unspecified abnormal findings in urine: Secondary | ICD-10-CM

## 2014-09-29 DIAGNOSIS — L02415 Cutaneous abscess of right lower limb: Secondary | ICD-10-CM | POA: Diagnosis not present

## 2014-09-29 LAB — POCT UA - MICROSCOPIC ONLY
Casts, Ur, LPF, POC: NEGATIVE
Crystals, Ur, HPF, POC: NEGATIVE
Mucus, UA: NEGATIVE
Yeast, UA: NEGATIVE

## 2014-09-29 LAB — POCT URINALYSIS DIPSTICK
Glucose, UA: NEGATIVE
Leukocytes, UA: NEGATIVE
Nitrite, UA: POSITIVE
Protein, UA: 30
Spec Grav, UA: >=1.03
Urobilinogen, UA: 0.2
pH, UA: 5.5

## 2014-09-29 LAB — COMPLETE METABOLIC PANEL WITH GFR
ALT: 13 U/L (ref 9–46)
AST: 19 U/L (ref 10–40)
Albumin: 4.1 g/dL (ref 3.6–5.1)
Alkaline Phosphatase: 63 U/L (ref 40–115)
BUN: 11 mg/dL (ref 7–25)
CO2: 25 mEq/L (ref 20–31)
Calcium: 8.8 mg/dL (ref 8.6–10.3)
Chloride: 104 mEq/L (ref 98–110)
Creat: 0.69 mg/dL (ref 0.60–1.35)
GFR, Est African American: >89 mL/min (ref >=60)
GFR, Est Non African American: >89 mL/min (ref >=60)
Glucose, Bld: 87 mg/dL (ref 65–99)
Potassium: 4.2 mEq/L (ref 3.5–5.3)
Sodium: 139 mEq/L (ref 135–146)
Total Bilirubin: 0.4 mg/dL (ref 0.2–1.2)
Total Protein: 6.9 g/dL (ref 6.1–8.1)

## 2014-09-29 LAB — POCT CBC
Granulocyte percent: 54 %G (ref 37–80)
HCT, POC: 30.1 % — AB (ref 43.5–53.7)
Hemoglobin: 9.1 g/dL — AB (ref 14.1–18.1)
Lymph, poc: 2.1 (ref 0.6–3.4)
MCH, POC: 19.9 pg — AB (ref 27–31.2)
MCHC: 30.3 g/dL — AB (ref 31.8–35.4)
MCV: 65.5 fL — AB (ref 80–97)
MID (cbc): 0.5 (ref 0–0.9)
MPV: 8.4 fL (ref 0–99.8)
POC Granulocyte: 3.1 (ref 2–6.9)
POC LYMPH PERCENT: 37.3 %L (ref 10–50)
POC MID %: 8.7 %M (ref 0–12)
Platelet Count, POC: 228 10*3/uL (ref 142–424)
RBC: 4.6 M/uL — AB (ref 4.69–6.13)
RDW, POC: 17.3 %
WBC: 5.7 10*3/uL (ref 4.6–10.2)

## 2014-09-29 MED ORDER — LISINOPRIL-HYDROCHLOROTHIAZIDE 20-25 MG PO TABS: 1.0000 | ORAL_TABLET | Freq: Every day | ORAL | Status: DC

## 2014-09-29 NOTE — Progress Notes (Signed)
MRN: [id number] DOB: [birth year]  Subjective:   Bradley [name] is a 44 y.o. male presenting for chief complaint of Recurrent Skin Infections  Skin infection - reports 3-4 week history of boil over his lower right leg. It has been intermittently painful, has redness, drains spontaneously. Denies fever, streaking, decreased ROM, decreased sensation, bony pain, swelling of lower leg.   History of HTN - previously managed with 3 BP meds, patient underwent bariatric surgery `7 years ago (~2009), subsequently taken off his meds due to his BP being normal in the following year. Today, he denies chest pain, shob, headache, blurred vision, confusion, n/v, abdominal pain, hematuria, lower leg swelling. He does have chronic pain due to back surgery; however, he has not need any of his pain medication today.  Denies any other aggravating or relieving factors, no other questions or concerns.  Jujhar has a current medication list which includes the following prescription(s): acetaminophen, cholecalciferol, cyanocobalamin, diclofenac, ferralet 90, folic acid, hydrocodone-acetaminophen, ibuprofen, multivitamin with minerals, ondansetron, pseudoephedrine, and zinc gluconate. He is allergic to codeine and percocet.  [place]  has a past medical history of Hypertension; Kidney stone; Anxiety; Depression; Displacement of intervertebral disc, site unspecified, without myelopathy; Other, mixed, or unspecified nondependent drug abuse, unspecified; Priapism; Arthropathy, unspecified, other specified sites; Lumbago; Contact with or exposure to venereal diseases; Personal history of urinary calculi; and Obesity, unspecified. Also  has past surgical history that includes Gastric bypass; Ankle surgery; Knee surgery; Hernia repair; panectomy; Incision and drainage abscess (Right, 07/06/2004); Irrigation and debridement abscess (Right, 07/08/2004); Microdiscectomy lumbar (Left, 08/24/2008); Abdominal surgery; and Back  surgery.  ROS As in subjective.  Objective:   Vitals: BP 178/110 mmHg  Pulse 65  Temp(Src) 98.1 F (36.7 C) (Oral)  Resp 15  Ht 5' 8.75" (1.746 m)  Wt 236 lb (107.049 kg)  BMI 35.12 kg/m2  SpO2 99%  BP 168/92 on recheck by PA-Susannah Carbin.  Physical Exam  Constitutional: He is oriented to person, place, and time. He appears well-developed and well-nourished.  HENT:  Mouth/Throat: Oropharynx is clear and moist.  Eyes: Pupils are equal, round, and reactive to light. No scleral icterus.  Neck: No thyromegaly present.  Cardiovascular: Normal rate, regular rhythm and intact distal pulses.  Exam reveals no gallop and no friction rub.   Murmur (grade II/VI systolic ejection murmur best heard at LUSB, not new) heard. Pulmonary/Chest: No respiratory distress. He has no wheezes. He has no rales.  Abdominal: Soft. Bowel sounds are normal. He exhibits no distension and no mass. There is no tenderness.  Musculoskeletal: Normal range of motion. He exhibits tenderness (over wound). He exhibits no edema.  Strength 5/5. Sensation intact.  Neurological: He is alert and oriented to person, place, and time.  Skin: Skin is warm and dry. No rash noted. There is erythema (near abscess). No pallor.     Psychiatric: He has a normal mood and affect.   ECG interpretation by Dr. Laney Pastor and PA-[place] - patient has artifact over p-waves, regular rate and rhythm otherwise.  Results for orders placed or performed in visit on 09/29/14 (from the past 24 hour(s))  POCT CBC     Status: Abnormal   Collection Time: 09/29/14  2:46 PM  Result Value Ref Range   WBC 5.7 4.6 - 10.2 K/uL   Lymph, poc 2.1 0.6 - 3.4   POC LYMPH PERCENT 37.3 10 - 50 %L   MID (cbc) 0.5 0 - 0.9   POC MID % 8.7 0 - 12 %M  POC Granulocyte 3.1 2 - 6.9   Granulocyte percent 54.0 37 - 80 %G   RBC 4.60 (A) 4.69 - 6.13 M/uL   Hemoglobin 9.1 (A) 14.1 - 18.1 g/dL   HCT, POC 30.1 (A) 43.5 - 53.7 %   MCV 65.5 (A) 80 - 97 fL   MCH, POC 19.9 (A)  27 - 31.2 pg   MCHC 30.3 (A) 31.8 - 35.4 g/dL   RDW, POC 17.3 %   Platelet Count, POC 228 142 - 424 K/uL   MPV 8.4 0 - 99.8 fL  POCT urinalysis dipstick     Status: None   Collection Time: 09/29/14  2:52 PM  Result Value Ref Range   Color, UA dark yellow    Clarity, UA sl cloudy    Glucose, UA neg    Bilirubin, UA small    Ketones, UA trace    Spec Grav, UA >=1.030    Blood, UA moderate    pH, UA 5.5    Protein, UA 30    Urobilinogen, UA 0.2    Nitrite, UA positive    Leukocytes, UA Negative Negative   Assessment and Plan :   1. Uncontrolled stage 2 hypertension - I precepted this patient's case with Dr. Laney Pastor. He is medically stable. PE findings, ECG and lack of symptoms reassuring. I will start patient on lis-HCT 1/2 tablet and potentially increase to full tablet at follow up in 2 days. - COMPLETE METABOLIC PANEL WITH GFR - EKG 12-Lead - POCT urinalysis dipstick - lisinopril-hydrochlorothiazide (PRINZIDE,ZESTORETIC) 10-12.5 MG per tablet; Take 1 tablet by mouth daily.  Dispense: 90 tablet; Refill: 3  2. Abscess of right lower leg - I&D performed, f/u for wound care in 2 days - Wound culture - POCT CBC  3. Abnormal urinalysis - Urine culture pending, will hold off on antibiotics for now, f/u as above  [name], PA-C [facility] [phone] 09/29/2014 1:39 PM

## 2014-09-29 NOTE — Patient Instructions (Signed)
Hypertension Hypertension, commonly called high blood pressure, is when the force of blood pumping through your arteries is too strong. Your arteries are the blood vessels that carry blood from your heart throughout your body. A blood pressure reading consists of a higher number over a lower number, such as 110/72. The higher number (systolic) is the pressure inside your arteries when your heart pumps. The lower number (diastolic) is the pressure inside your arteries when your heart relaxes. Ideally you want your blood pressure below 120/80. Hypertension forces your heart to work harder to pump blood. Your arteries may become narrow or stiff. Having hypertension puts you at risk for heart disease, stroke, and other problems.  RISK FACTORS Some risk factors for high blood pressure are controllable. Others are not.  Risk factors you cannot control include:   Race. You may be at higher risk if you are African American.  Age. Risk increases with age.  Gender. Men are at higher risk than women before age 34 years. After age 51, women are at higher risk than men. Risk factors you can control include:  Not getting enough exercise or physical activity.  Being overweight.  Getting too much fat, sugar, calories, or salt in your diet.  Drinking too much alcohol. SIGNS AND SYMPTOMS Hypertension does not usually cause signs or symptoms. Extremely high blood pressure (hypertensive crisis) may cause headache, anxiety, shortness of breath, and nosebleed. DIAGNOSIS  To check if you have hypertension, your health care provider will measure your blood pressure while you are seated, with your arm held at the level of your heart. It should be measured at least twice using the same arm. Certain conditions can cause a difference in blood pressure between your right and left arms. A blood pressure reading that is higher than normal on one occasion does not mean that you need treatment. If one blood pressure reading  is high, ask your health care provider about having it checked again. TREATMENT  Treating high blood pressure includes making lifestyle changes and possibly taking medicine. Living a healthy lifestyle can help lower high blood pressure. You may need to change some of your habits. Lifestyle changes may include:  Following the DASH diet. This diet is high in fruits, vegetables, and whole grains. It is low in salt, red meat, and added sugars.  Getting at least 2 hours of brisk physical activity every week.  Losing weight if necessary.  Not smoking.  Limiting alcoholic beverages.  Learning ways to reduce stress. If lifestyle changes are not enough to get your blood pressure under control, your health care provider may prescribe medicine. You may need to take more than one. Work closely with your health care provider to understand the risks and benefits. HOME CARE INSTRUCTIONS  Have your blood pressure rechecked as directed by your health care provider.   Take medicines only as directed by your health care provider. Follow the directions carefully. Blood pressure medicines must be taken as prescribed. The medicine does not work as well when you skip doses. Skipping doses also puts you at risk for problems.   Do not smoke.   Monitor your blood pressure at home as directed by your health care provider. SEEK MEDICAL CARE IF:   You think you are having a reaction to medicines taken.  You have recurrent headaches or feel dizzy.  You have swelling in your ankles.  You have trouble with your vision. SEEK IMMEDIATE MEDICAL CARE IF:  You develop a severe headache or confusion.  You have unusual weakness, numbness, or feel faint.  You have severe chest or abdominal pain.  You vomit repeatedly.  You have trouble breathing. MAKE SURE YOU:   Understand these instructions.  Will watch your condition.  Will get help right away if you are not doing well or get worse. Document  Released: 02/20/2005 Document Revised: 07/07/2013 Document Reviewed: 12/13/2012 [facility] Patient Information 2015 [place], [place]. This information is not intended to replace advice given to you by your health care provider. Make sure you discuss any questions you have with your health care provider.    Incision and Drainage Incision and drainage is a procedure in which a sac-like structure (cystic structure) is opened and drained. The area to be drained usually contains material such as pus, fluid, or blood.  LET YOUR CAREGIVER KNOW ABOUT:   Allergies to medicine.  Medicines taken, including vitamins, herbs, eyedrops, over-the-counter medicines, and creams.  Use of steroids (by mouth or creams).  Previous problems with anesthetics or numbing medicines.  History of bleeding problems or blood clots.  Previous surgery.  Other health problems, including diabetes and kidney problems.  Possibility of pregnancy, if this applies. RISKS AND COMPLICATIONS  Pain.  Bleeding.  Scarring.  Infection. BEFORE THE PROCEDURE  You may need to have an ultrasound or other imaging tests to see how large or deep your cystic structure is. Blood tests may also be used to determine if you have an infection or how severe the infection is. You may need to have a tetanus shot. PROCEDURE  The affected area is cleaned with a cleaning fluid. The cyst area will then be numbed with a medicine (local anesthetic). A small incision will be made in the cystic structure. A syringe or catheter may be used to drain the contents of the cystic structure, or the contents may be squeezed out. The area will then be flushed with a cleansing solution. After cleansing the area, it is often gently packed with a gauze or another wound dressing. Once it is packed, it will be covered with gauze and tape or some other type of wound dressing. AFTER THE PROCEDURE   Often, you will be allowed to go home right after the  procedure.  You may be given antibiotic medicine to prevent or heal an infection.  If the area was packed with gauze or some other wound dressing, you will likely need to come back in 1 to 2 days to get it removed.  The area should heal in about 14 days. Document Released: 08/16/2000 Document Revised: 08/22/2011 Document Reviewed: 04/17/2011 [facility] Patient Information 2015 [place], [place]. This information is not intended to replace advice given to you by your health care provider. Make sure you discuss any questions you have with your health care provider.

## 2014-10-01 ENCOUNTER — Ambulatory Visit (INDEPENDENT_AMBULATORY_CARE_PROVIDER_SITE_OTHER): Payer: 59 | Admitting: Urgent Care

## 2014-10-01 VITALS — BP 122/70 | HR 73 | Temp 98.8°F | Resp 16 | Ht 68.0 in | Wt 234.0 lb

## 2014-10-01 DIAGNOSIS — I10 Essential (primary) hypertension: Secondary | ICD-10-CM | POA: Diagnosis not present

## 2014-10-01 DIAGNOSIS — L02415 Cutaneous abscess of right lower limb: Secondary | ICD-10-CM

## 2014-10-01 NOTE — Patient Instructions (Signed)
Abscess  An abscess is an infected area that contains a collection of pus and debris.It can occur in almost any part of the body. An abscess is also known as a furuncle or boil.  CAUSES   An abscess occurs when tissue gets infected. This can occur from blockage of oil or sweat glands, infection of hair follicles, or a minor injury to the skin. As the body tries to fight the infection, pus collects in the area and creates pressure under the skin. This pressure causes pain. People with weakened immune systems have difficulty fighting infections and get certain abscesses more often.   SYMPTOMS  Usually an abscess develops on the skin and becomes a painful mass that is red, warm, and tender. If the abscess forms under the skin, you may feel a moveable soft area under the skin. Some abscesses break open (rupture) on their own, but most will continue to get worse without care. The infection can spread deeper into the body and eventually into the bloodstream, causing you to feel ill.   DIAGNOSIS   Your caregiver will take your medical history and perform a physical exam. A sample of fluid may also be taken from the abscess to determine what is causing your infection.  TREATMENT   Your caregiver may prescribe antibiotic medicines to fight the infection. However, taking antibiotics alone usually does not cure an abscess. Your caregiver may need to make a small cut (incision) in the abscess to drain the pus. In some cases, gauze is packed into the abscess to reduce pain and to continue draining the area.  HOME CARE INSTRUCTIONS    Only take over-the-counter or prescription medicines for pain, discomfort, or fever as directed by your caregiver.   If you were prescribed antibiotics, take them as directed. Finish them even if you start to feel better.   If gauze is used, follow your caregiver's directions for changing the gauze.   To avoid spreading the infection:   Keep your draining abscess covered with a  bandage.   Wash your hands well.   Do not share personal care items, towels, or whirlpools with others.   Avoid skin contact with others.   Keep your skin and clothes clean around the abscess.   Keep all follow-up appointments as directed by your caregiver.  SEEK MEDICAL CARE IF:    You have increased pain, swelling, redness, fluid drainage, or bleeding.   You have muscle aches, chills, or a general ill feeling.   You have a fever.  MAKE SURE YOU:    Understand these instructions.   Will watch your condition.   Will get help right away if you are not doing well or get worse.  Document Released: 11/30/2004 Document Revised: 08/22/2011 Document Reviewed: 05/05/2011  [employer] Patient Information 2015 [employer]. This information is not intended to replace advice given to you by your health care provider. Make sure you discuss any questions you have with your health care provider.  Incision and Drainage  Incision and drainage is a procedure in which a sac-like structure (cystic structure) is opened and drained. The area to be drained usually contains material such as pus, fluid, or blood.   LET YOUR CAREGIVER KNOW ABOUT:    Allergies to medicine.   Medicines taken, including vitamins, herbs, eyedrops, over-the-counter medicines, and creams.   Use of steroids (by mouth or creams).   Previous problems with anesthetics or numbing medicines.   History of bleeding problems or blood clots.     Previous surgery.   Other health problems, including diabetes and kidney problems.   Possibility of pregnancy, if this applies.  RISKS AND COMPLICATIONS   Pain.   Bleeding.   Scarring.   Infection.  BEFORE THE PROCEDURE   You may need to have an ultrasound or other imaging tests to see how large or deep your cystic structure is. Blood tests may also be used to determine if you have an infection or how severe the infection is. You may need to have a tetanus shot.  PROCEDURE   The affected area is cleaned with a  cleaning fluid. The cyst area will then be numbed with a medicine (local anesthetic). A small incision will be made in the cystic structure. A syringe or catheter may be used to drain the contents of the cystic structure, or the contents may be squeezed out. The area will then be flushed with a cleansing solution. After cleansing the area, it is often gently packed with a gauze or another wound dressing. Once it is packed, it will be covered with gauze and tape or some other type of wound dressing.  AFTER THE PROCEDURE    Often, you will be allowed to go home right after the procedure.   You may be given antibiotic medicine to prevent or heal an infection.   If the area was packed with gauze or some other wound dressing, you will likely need to come back in 1 to 2 days to get it removed.   The area should heal in about 14 days.  Document Released: 08/16/2000 Document Revised: 08/22/2011 Document Reviewed: 04/17/2011  [employer] Patient Information 2015 [employer]. This information is not intended to replace advice given to you by your health care provider. Make sure you discuss any questions you have with your health care provider.

## 2014-10-01 NOTE — Progress Notes (Signed)
    MRN: [id number] DOB: 23-Dec-1970  Subjective:   URHO RIO is a 44 y.o. male presenting for follow up on abscess and HTN.   Abscess - reports significant improvement in pain, redness and drainage. He did change dressing once and denies drainage. No fevers.  HTN - reports significant improvement. Has been taking 1 full tablet. Patient was asymptomatic 09/29/2014. Denies chest pain, heart racing, shob, n/v, abdominal pain, difficulty breathing.  Denies any other aggravating or relieving factors, no other questions or concerns.  Camron has a current medication list which includes the following prescription(s): cyanocobalamin, ferralet 90, folic acid, lisinopril-hydrochlorothiazide, multivitamin with minerals, pseudoephedrine, zinc gluconate, acetaminophen, cholecalciferol, diclofenac, hydrocodone-acetaminophen, ibuprofen, and ondansetron. He is allergic to codeine and percocet.  Akira  has a past medical history of Hypertension; Kidney stone; Anxiety; Depression; Displacement of intervertebral disc, site unspecified, without myelopathy; Other, mixed, or unspecified nondependent drug abuse, unspecified; Priapism; Arthropathy, unspecified, other specified sites; Lumbago; Contact with or exposure to venereal diseases; Personal history of urinary calculi; and Obesity, unspecified. Also  has past surgical history that includes Gastric bypass; Ankle surgery; Knee surgery; Hernia repair; panectomy; Incision and drainage abscess (Right, 07/06/2004); Irrigation and debridement abscess (Right, 07/08/2004); Microdiscectomy lumbar (Left, 08/24/2008); Abdominal surgery; and Back surgery.  ROS As in subjective.  Objective:   Vitals: BP 122/70 mmHg  Pulse 73  Temp(Src) 98.8 F (37.1 C) (Oral)  Resp 16  Ht 5' 8"  (1.727 m)  Wt 234 lb (106.142 kg)  BMI 35.59 kg/m2  SpO2 99%  Physical Exam  Constitutional: He is oriented to person, place, and time. He appears well-developed and well-nourished.    Cardiovascular: Normal rate, regular rhythm and intact distal pulses.  Exam reveals no gallop and no friction rub.   No murmur heard. Pulmonary/Chest: No respiratory distress. He has no wheezes. He has no rales.  Abdominal: Soft. Bowel sounds are normal. He exhibits no distension and no mass. There is no tenderness.  Musculoskeletal:       Right lower leg: He exhibits no tenderness, no bony tenderness, no swelling, no edema, no deformity and no laceration.       Legs: Neurological: He is alert and oriented to person, place, and time.  Skin: Skin is warm and dry. No rash noted. No erythema. No pallor.   WOUND CARE: packing removed with small amount of drainage. Wound irrigated with ~30cc of NS. Wound cavity explored, no loculations, no tracts, no additional purulent material expressed. Wound was not packed. Cleansed and dressed.  Assessment and Plan :   1. Abscess of right lower leg - Healing well, anticipatory guidance provided. Patient to call if his symptoms regress, start to show signs of infection. Will start antibiotic at that point. Otherwise, patient is to continue daily dressing changes until wound is closed and healed. Patient and his wife verbalized understanding.  2. Essential hypertension - Stable, continue with current dose, f/u with PCP, Dr. Ronnald Ramp. Patient agreed.  [name], PA-C [facility] [phone] 10/01/2014 2:29 PM.

## 2014-10-02 LAB — WOUND CULTURE
Gram Stain: NONE SEEN
Gram Stain: NONE SEEN

## 2014-10-03 LAB — URINE CULTURE: Colony Count: >=100000

## 2014-10-08 ENCOUNTER — Telehealth: Payer: Self-pay | Admitting: Urgent Care

## 2014-10-08 MED ORDER — SULFAMETHOXAZOLE-TRIMETHOPRIM 800-160 MG PO TABS: 1.0000 | ORAL_TABLET | Freq: Two times a day (BID) | ORAL | Status: DC

## 2014-10-08 NOTE — Telephone Encounter (Signed)
Spoke with patient, reports abscess is still a little red and somewhat tender but not as bad as that was when he came here initially. He has been checking his blood pressure daily and is running in the 003L systolic. He agreed to start Septrafor 10 days to cover for MRSA. I will call this [phone], [facility] on [address]

## 2014-11-27 ENCOUNTER — Other Ambulatory Visit (HOSPITAL_COMMUNITY): Payer: Self-pay | Admitting: Neurosurgery

## 2014-11-27 DIAGNOSIS — M5416 Radiculopathy, lumbar region: Secondary | ICD-10-CM

## 2014-11-30 ENCOUNTER — Ambulatory Visit (INDEPENDENT_AMBULATORY_CARE_PROVIDER_SITE_OTHER): Payer: Managed Care, Other (non HMO) | Admitting: Emergency Medicine

## 2014-11-30 VITALS — BP 126/78 | HR 88 | Temp 98.0°F | Resp 18 | Ht 68.0 in | Wt 229.6 lb

## 2014-11-30 DIAGNOSIS — L03012 Cellulitis of left finger: Secondary | ICD-10-CM

## 2014-11-30 MED ORDER — NAPROXEN SODIUM 550 MG PO TABS: 550.0000 mg | ORAL_TABLET | Freq: Two times a day (BID) | ORAL | Status: DC

## 2014-11-30 MED ORDER — SULFAMETHOXAZOLE-TRIMETHOPRIM 800-160 MG PO TABS: 1.0000 | ORAL_TABLET | Freq: Two times a day (BID) | ORAL | Status: DC

## 2014-11-30 NOTE — Patient Instructions (Signed)

## 2014-11-30 NOTE — Progress Notes (Addendum)
Subjective:  Patient ID: Bradley [name], male    DOB: 04-Jan-1971  Age: 44 y.o. MRN: [id number]  CC: Hand Pain   HPI Bradley [name] presents   Patient has a history of MRSA and his Come intoday with aSwelling and redness in his left hand. He said these are rather tender he has no history of injury and he said there like when he had before when he had MRSA. He has no fever chills  History Ovide has a past medical history of Hypertension; Kidney stone; Anxiety; Depression; Displacement of intervertebral disc, site unspecified, without myelopathy; Other, mixed, or unspecified nondependent drug abuse, unspecified; Priapism; Arthropathy, unspecified, other specified sites; Lumbago; Contact with or exposure to venereal diseases; Personal history of urinary calculi; and Obesity, unspecified.   He has past surgical history that includes Gastric bypass; Ankle surgery; Knee surgery; Hernia repair; panectomy; Incision and drainage abscess (Right, 07/06/2004); Irrigation and debridement abscess (Right, 07/08/2004); Microdiscectomy lumbar (Left, 08/24/2008); Abdominal surgery; and Back surgery.   His  family history includes Arthritis in his mother; Heart disease in his father; Hypertension in his father; Kidney disease in his father.  He   reports that he has never smoked. He has never used smokeless tobacco. He reports that he does not drink alcohol or use illicit drugs.  Outpatient Prescriptions Prior to Visit  Medication Sig Dispense Refill  . acetaminophen (TYLENOL) 500 MG tablet Take 1,000 mg by mouth every 6 (six) hours as needed for mild pain.    . Cholecalciferol 50000 UNITS TABS Take 1 tablet by mouth once a week. 12 tablet 3  . Cyanocobalamin (NASCOBAL) 500 MCG/0.1ML SOLN Place 1 mL (5,000 mcg total) into the nose once a week. 3.9 mL 3  . Fe Cbn-Fe Gluc-FA-B12-C-DSS (FERRALET 90) 90-1 MG TABS Take 1 tablet by mouth daily. 90 each 3  . folic acid (FOLVITE) 1 MG tablet Take 1 tablet (1 mg  total) by mouth daily. 90 tablet 3  . ibuprofen (ADVIL,MOTRIN) 200 MG tablet Take 800 mg by mouth every 6 (six) hours as needed for moderate pain.    Marland Kitchen lisinopril-hydrochlorothiazide (PRINZIDE,ZESTORETIC) 20-25 MG per tablet Take 1 tablet by mouth daily. 90 tablet 3  . Multiple Vitamin (MULTIVITAMIN WITH MINERALS) TABS tablet Take 1 tablet by mouth daily.    . pseudoephedrine (SUDAFED) 60 MG tablet Take 1 tablet (60 mg total) by mouth every 4 (four) hours as needed (priapism). 30 tablet 0  . zinc gluconate 50 MG tablet Take 1 tablet (50 mg total) by mouth daily. 90 tablet 3  . diclofenac (VOLTAREN) 75 MG EC tablet Take 1 tablet (75 mg total) by mouth 2 (two) times daily. (Patient not taking: Reported on 10/01/2014) 60 tablet 0  . HYDROcodone-acetaminophen (NORCO) 10-325 MG per tablet Take 1 tablet by mouth every 4 (four) hours as needed for moderate pain.    Marland Kitchen ondansetron (ZOFRAN ODT) 4 MG disintegrating tablet Take 1 tablet (4 mg total) by mouth every 8 (eight) hours as needed for nausea or vomiting. (Patient not taking: Reported on 10/01/2014) 10 tablet 0  . sulfamethoxazole-trimethoprim (BACTRIM DS,SEPTRA DS) 800-160 MG per tablet Take 1 tablet by mouth 2 (two) times daily. 20 tablet 0   No facility-administered medications prior to visit.    Social History   Social History  . Marital Status: Married    Spouse Name: N/A  . Number of Children: N/A  . Years of Education: N/A   Social History Main Topics  . Smoking status:  Never Smoker   . Smokeless tobacco: Never Used  . Alcohol Use: No  . Drug Use: No  . Sexual Activity: Yes   Other Topics Concern  . None   Social History Narrative     Review of Systems  Constitutional: Negative for fever, chills and appetite change.  HENT: Negative for congestion, ear pain, postnasal drip, sinus pressure and sore throat.   Eyes: Negative for pain and redness.  Respiratory: Negative for cough, shortness of breath and wheezing.     Cardiovascular: Negative for leg swelling.  Gastrointestinal: Negative for nausea, vomiting, abdominal pain, diarrhea, constipation and blood in stool.  Endocrine: Negative for polyuria.  Genitourinary: Negative for dysuria, urgency, frequency and flank pain.  Musculoskeletal: Negative for gait problem.  Skin: Negative for rash.  Neurological: Negative for weakness and headaches.  Psychiatric/Behavioral: Negative for confusion and decreased concentration. The patient is not nervous/anxious.     Objective:  BP 126/78 mmHg  Pulse 88  Temp(Src) 98 F (36.7 C) (Oral)  Resp 18  Ht 5' 8"  (1.727 m)  Wt 229 lb 9.6 oz (104.146 kg)  BMI 34.92 kg/m2  SpO2 91%  Physical Exam  Constitutional: He is oriented to person, place, and time. He appears well-developed and well-nourished.  HENT:  Head: Normocephalic and atraumatic.  Eyes: Conjunctivae are normal. Pupils are equal, round, and reactive to light.  Pulmonary/Chest: Effort normal.  Musculoskeletal: He exhibits no edema.  Neurological: He is alert and oriented to person, place, and time.  Skin: Skin is dry. There is erythema.  Psychiatric: He has a normal mood and affect. His behavior is normal. Thought content normal.      Assessment & Plan:   Ovila was seen today for hand pain.  Diagnoses and all orders for this visit:  Cellulitis of finger of left hand  Other orders -     sulfamethoxazole-trimethoprim (BACTRIM DS,SEPTRA DS) 800-160 MG per tablet; Take 1 tablet by mouth 2 (two) times daily. -     naproxen sodium (ANAPROX DS) 550 MG tablet; Take 1 tablet (550 mg total) by mouth 2 (two) times daily with a meal.  I have discontinued Mr. Gintz HYDROcodone-acetaminophen, diclofenac, ondansetron, and sulfamethoxazole-trimethoprim. I am also having him start on sulfamethoxazole-trimethoprim and naproxen sodium. Additionally, I am having him maintain his multivitamin with minerals, pseudoephedrine, FERRALET 90, folic acid,  Cyanocobalamin, Cholecalciferol, zinc gluconate, ibuprofen, acetaminophen, and lisinopril-hydrochlorothiazide.  Meds ordered this encounter  Medications  . sulfamethoxazole-trimethoprim (BACTRIM DS,SEPTRA DS) 800-160 MG per tablet    Sig: Take 1 tablet by mouth 2 (two) times daily.    Dispense:  20 tablet    Refill:  0  . naproxen sodium (ANAPROX DS) 550 MG tablet    Sig: Take 1 tablet (550 mg total) by mouth 2 (two) times daily with a meal.    Dispense:  40 tablet    Refill:  0   He did request something for pain but he does have a history of narcotic abuse so I told him I wouldn't give him any narcotics he seemed rather annoyed withThat   Appropriate red flag conditions were discussed with the patient as well as actions that should be taken.  Patient expressed his understanding.  Follow-up: Return if symptoms worsen or fail to improve.  [name], MD

## 2014-12-02 ENCOUNTER — Ambulatory Visit (HOSPITAL_COMMUNITY)
Admission: RE | Admit: 2014-12-02 | Discharge: 2014-12-02 | Disposition: A | Discharge status: Home / Self Care | Payer: Managed Care, Other (non HMO) | Source: Ambulatory Visit | Attending: Neurosurgery | Admitting: Neurosurgery

## 2014-12-02 DIAGNOSIS — M5416 Radiculopathy, lumbar region: Secondary | ICD-10-CM | POA: Insufficient documentation

## 2014-12-02 MED ORDER — GADOBENATE DIMEGLUMINE 529 MG/ML IV SOLN
20.0000 mL | Freq: Once | INTRAVENOUS | Status: AC | PRN
  Administered 2014-12-02: 20 mL via INTRAVENOUS

## 2015-01-18 ENCOUNTER — Ambulatory Visit (INDEPENDENT_AMBULATORY_CARE_PROVIDER_SITE_OTHER): Payer: Managed Care, Other (non HMO)

## 2015-01-18 ENCOUNTER — Ambulatory Visit (INDEPENDENT_AMBULATORY_CARE_PROVIDER_SITE_OTHER): Payer: Managed Care, Other (non HMO) | Admitting: Family Medicine

## 2015-01-18 VITALS — BP 126/76 | HR 97 | Temp 99.7°F | Resp 18 | Ht 68.0 in | Wt 224.0 lb

## 2015-01-18 DIAGNOSIS — J189 Pneumonia, unspecified organism: Secondary | ICD-10-CM

## 2015-01-18 LAB — POCT CBC
Granulocyte percent: 85.1 %G — AB (ref 37–80)
HCT, POC: 23.4 % — AB (ref 43.5–53.7)
Hemoglobin: 7.4 g/dL — AB (ref 14.1–18.1)
Lymph, poc: 2.2 (ref 0.6–3.4)
MCH, POC: 19.9 pg — AB (ref 27–31.2)
MCHC: 31.6 g/dL — AB (ref 31.8–35.4)
MCV: 63 fL — AB (ref 80–97)
MID (cbc): 0.9 (ref 0–0.9)
MPV: 7.5 fL (ref 0–99.8)
POC Granulocyte: 17.4 — AB (ref 2–6.9)
POC LYMPH PERCENT: 10.7 %L (ref 10–50)
POC MID %: 4.2 %M (ref 0–12)
Platelet Count, POC: 305 10*3/uL (ref 142–424)
RBC: 3.72 M/uL — AB (ref 4.69–6.13)
RDW, POC: 17.2 %
WBC: 20.5 10*3/uL — AB (ref 4.6–10.2)

## 2015-01-18 MED ORDER — CEFTRIAXONE SODIUM 1 G IJ SOLR
1.0000 g | Freq: Once | INTRAMUSCULAR | Status: AC
  Administered 2015-01-18: 1 g via INTRAMUSCULAR

## 2015-01-18 MED ORDER — BENZONATATE 200 MG PO CAPS: 200.0000 mg | ORAL_CAPSULE | Freq: Two times a day (BID) | ORAL | Status: DC | PRN

## 2015-01-18 MED ORDER — LEVOFLOXACIN 500 MG PO TABS: 500.0000 mg | ORAL_TABLET | Freq: Every day | ORAL | Status: DC

## 2015-01-18 NOTE — Progress Notes (Signed)
This chart was scribed for Bradley Haber, MD by Moises Blood, medical scribe at [facility].The patient was seen in exam room 3 and the patient's care was started at 8:46 AM.  Patient ID: Bradley [name] MRN: [id number], DOB: 07-11-1970, 44 y.o. Date of Encounter: 01/18/2015  Primary Physician: [name], MD  Chief Complaint:  Chief Complaint  Patient presents with  . Cough    Onset 6 days/ productive, green  . Fever    Last night was 102.7  . Headache    HPI:  Bradley [name] is a 44 y.o. male who presents to [facility] complaining of productive cough (green) with headaches that started 6 days ago. He was in [place] because his wife works there, and that's when he noticed the productive coughs. He had a fever last night Tmax 102.7.   He's currently on disability due to his ongoing back problem.   Past Medical History  Diagnosis Date  . Hypertension   . Kidney stone   . Anxiety   . Depression   . Displacement of intervertebral disc, site unspecified, without myelopathy   . Other, mixed, or unspecified nondependent drug abuse, unspecified   . Priapism   . Arthropathy, unspecified, other specified sites   . Lumbago   . Contact with or exposure to venereal diseases   . Personal history of urinary calculi   . Obesity, unspecified      Home Meds: Prior to Admission medications   Medication Sig Start Date End Date Taking? Authorizing Provider  acetaminophen (TYLENOL) 500 MG tablet Take 1,000 mg by mouth every 6 (six) hours as needed for mild pain.    Historical Provider, MD  Cholecalciferol 50000 UNITS TABS Take 1 tablet by mouth once a week. 04/08/14   [name], MD  Cyanocobalamin (NASCOBAL) 500 MCG/0.1ML SOLN Place 1 mL (5,000 mcg total) into the nose once a week. 04/08/14   [name], MD  Fe Cbn-Fe Gluc-FA-B12-C-DSS (FERRALET 90) 90-1 MG TABS Take 1 tablet by mouth daily. 04/08/14   [name], MD  folic acid (FOLVITE)  1 MG tablet Take 1 tablet (1 mg total) by mouth daily. 04/08/14   [name], MD  ibuprofen (ADVIL,MOTRIN) 200 MG tablet Take 800 mg by mouth every 6 (six) hours as needed for moderate pain.    Historical Provider, MD  lisinopril-hydrochlorothiazide (PRINZIDE,ZESTORETIC) 20-25 MG per tablet Take 1 tablet by mouth daily. 09/29/14   [name], PA-C  Multiple Vitamin (MULTIVITAMIN WITH MINERALS) TABS tablet Take 1 tablet by mouth daily.    Historical Provider, MD  naproxen sodium (ANAPROX DS) 550 MG tablet Take 1 tablet (550 mg total) by mouth 2 (two) times daily with a meal. 11/30/14 11/30/15  [name], MD  pseudoephedrine (SUDAFED) 60 MG tablet Take 1 tablet (60 mg total) by mouth every 4 (four) hours as needed (priapism). 06/03/13   [name], MD  sulfamethoxazole-trimethoprim (BACTRIM DS,SEPTRA DS) 800-160 MG per tablet Take 1 tablet by mouth 2 (two) times daily. 11/30/14   [name], MD  zinc gluconate 50 MG tablet Take 1 tablet (50 mg total) by mouth daily. 04/12/14   [name], MD    Allergies:  Allergies  Allergen Reactions  . Codeine Hives  . Percocet [Oxycodone-Acetaminophen] Hives and Itching    Hives and itching    Social History   Social History  . Marital Status: Married    Spouse  Name: N/A  . Number of Children: N/A  . Years of Education: N/A   Occupational History  . Not on file.   Social History Main Topics  . Smoking status: Never Smoker   . Smokeless tobacco: Never Used  . Alcohol Use: No  . Drug Use: No  . Sexual Activity: Yes   Other Topics Concern  . Not on file   Social History Narrative     Review of Systems: Constitutional: negative for chills, night sweats, weight changes, or fatigue; positive for fever HEENT: negative for vision changes, hearing loss, congestion, rhinorrhea, ST, epistaxis, or sinus pressure  Cardiovascular: negative for chest pain or palpitations Respiratory: negative for hemoptysis, wheezing, shortness of  breath; positive for cough Abdominal: negative for abdominal pain, nausea, vomiting, diarrhea, or constipation Dermatological: negative for rash Neurologic: negative for dizziness, or syncope; positive for headache All other systems reviewed and are otherwise negative with the exception to those above and in the HPI.  Physical Exam: Blood pressure 126/76, pulse 97, temperature 99.7 F (37.6 C), temperature source Oral, resp. rate 18, height 5' 8"  (1.727 m), weight 224 lb (101.606 kg), SpO2 95 %., Body mass index is 34.07 kg/(m^2). General: Well developed, well nourished, in no acute distress; ashen in appearance Head: Normocephalic, atraumatic, eyes without discharge, sclera non-icteric, nares are without discharge. Bilateral auditory canals clear, TM's are without perforation, pearly grey and translucent with reflective cone of light bilaterally. Oral cavity moist, posterior pharynx without exudate, erythema, peritonsillar abscess, or post nasal drip.  Neck: Supple. No thyromegaly. Full ROM. No lymphadenopathy. Lungs: Clear bilaterally to auscultation without wheezes, or rhonchi. Rales in right lower lobe Heart: RRR with S1 S2. No rubs, or gallops appreciated. Slight murmur appreciated Abdomen: Soft, non-tender, non-distended with normoactive bowel sounds. No hepatomegaly. No rebound/guarding. No obvious abdominal masses. Msk:  Strength and tone normal for age. Extremities/Skin: Warm and dry. No clubbing or cyanosis. No edema. No rashes or suspicious lesions.skin well healed in his leg Neuro: Alert and oriented X 3. Moves all extremities spontaneously. Gait is normal. CNII-XII grossly in tact. Psych:  Responds to questions appropriately with a normal affect.   [facility] reading (PRIMARY) by Dr. Joseph Art : xray chest: pneumonia in right lower lobe Results for orders placed or performed in visit on 01/18/15  POCT CBC  Result Value Ref Range   WBC 20.5 (A) 4.6 - 10.2 K/uL   Lymph, poc 2.2 0.6 -  3.4   POC LYMPH PERCENT 10.7 10 - 50 %L   MID (cbc) 0.9 0 - 0.9   POC MID % 4.2 0 - 12 %M   POC Granulocyte 17.4 (A) 2 - 6.9   Granulocyte percent 85.1 (A) 37 - 80 %G   RBC 3.72 (A) 4.69 - 6.13 M/uL   Hemoglobin 7.4 (A) 14.1 - 18.1 g/dL   HCT, POC 23.4 (A) 43.5 - 53.7 %   MCV 63.0 (A) 80 - 97 fL   MCH, POC 19.9 (A) 27 - 31.2 pg   MCHC 31.6 (A) 31.8 - 35.4 g/dL   RDW, POC 17.2 %   Platelet Count, POC 305 142 - 424 K/uL   MPV 7.5 0 - 99.8 fL     ASSESSMENT AND PLAN:  44 y.o. year old male with  This chart was scribed in my presence and reviewed by me personally.    ICD-9-CM ICD-10-CM   1. CAP (community acquired pneumonia) 30 J18.9 POCT CBC     DG Chest 2 View  cefTRIAXone (ROCEPHIN) injection 1 g     levofloxacin (LEVAQUIN) 500 MG tablet     benzonatate (TESSALON) 200 MG capsule   Recheck 48 hours  By signing my name below, I, Moises Blood, attest that this documentation has been prepared under the direction and in the presence of Bradley Haber, MD. Electronically Signed: Moises Blood, Scribe. 01/18/2015 , 8:46 AM .  Signed, Bradley Haber, MD 01/18/2015 8:46 AM

## 2015-01-18 NOTE — Patient Instructions (Addendum)
Please return in 48 hours for recheck   Community-Acquired Pneumonia, Adult Pneumonia is an infection of the lungs. There are different types of pneumonia. One type can develop while a person is in a hospital. A different type, called community-acquired pneumonia, develops in people who are not, or have not recently been, in the hospital or other health care facility.  CAUSES Pneumonia may be caused by bacteria, viruses, or funguses. Community-acquired pneumonia is often caused by Streptococcus pneumonia bacteria. These bacteria are often passed from one person to another by breathing in droplets from the cough or sneeze of an infected person. RISK FACTORS The condition is more likely to develop in:  People who havechronic diseases, such as chronic obstructive pulmonary disease (COPD), asthma, congestive heart failure, cystic fibrosis, diabetes, or kidney disease.  People who haveearly-stage or late-stage HIV.  People who havesickle cell disease.  People who havehad their spleen removed (splenectomy).  People who havepoor Human resources officer.  People who havemedical conditions that increase the risk of breathing in (aspirating) secretions their own mouth and nose.   People who havea weakened immune system (immunocompromised).  People who smoke.  People whotravel to areas where pneumonia-causing germs commonly exist.  People whoare around animal habitats or animals that have pneumonia-causing germs, including birds, bats, rabbits, cats, and farm animals. SYMPTOMS Symptoms of this condition include:  Adry cough.  A wet (productive) cough.  Fever.  Sweating.  Chest pain, especially when breathing deeply or coughing.  Rapid breathing or difficulty breathing.  Shortness of breath.  Shaking chills.  Fatigue.  Muscle aches. DIAGNOSIS Your health care provider will take a medical history and perform a physical exam. You may also have other tests,  including:  Imaging studies of your chest, including X-rays.  Tests to check your blood oxygen level and other blood gases.  Other tests on blood, mucus (sputum), fluid around your lungs (pleural fluid), and urine. If your pneumonia is severe, other tests may be done to identify the specific cause of your illness. TREATMENT The type of treatment that you receive depends on many factors, such as the cause of your pneumonia, the medicines you take, and other medical conditions that you have. For most adults, treatment and recovery from pneumonia may occur at home. In some cases, treatment must happen in a hospital. Treatment may include:  Antibiotic medicines, if the pneumonia was caused by bacteria.  Antiviral medicines, if the pneumonia was caused by a virus.  Medicines that are given by mouth or through an IV tube.  Oxygen.  Respiratory therapy. Although rare, treating severe pneumonia may include:  Mechanical ventilation. This is done if you are not breathing well on your own and you cannot maintain a safe blood oxygen level.  Thoracentesis. This procedureremoves fluid around one lung or both lungs to help you breathe better. HOME CARE INSTRUCTIONS  Take over-the-counter and prescription medicines only as told by your health care provider.  Only takecough medicine if you are losing sleep. Understand that cough medicine can prevent your body's natural ability to remove mucus from your lungs.  If you were prescribed an antibiotic medicine, take it as told by your health care provider. Do not stop taking the antibiotic even if you start to feel better.  Sleep in a semi-upright position at night. Try sleeping in a reclining chair, or place a few pillows under your head.  Do not use tobacco products, including cigarettes, chewing tobacco, and e-cigarettes. If you need help quitting, ask your health care  provider.  Drink enough water to keep your urine clear or pale yellow. This  will help to thin out mucus secretions in your lungs. PREVENTION There are ways that you can decrease your risk of developing community-acquired pneumonia. Consider getting a pneumococcal vaccine if:  You are older than 46 years of age.  You are older than 46 years of age and are undergoing cancer treatment, have chronic lung disease, or have other medical conditions that affect your immune system. Ask your health care provider if this applies to you. There are different types and schedules of pneumococcal vaccines. Ask your health care provider which vaccination option is best for you. You may also prevent community-acquired pneumonia if you take these actions:  Get an influenza vaccine every year. Ask your health care provider which type of influenza vaccine is best for you.  Go to the dentist on a regular basis.  Wash your hands often. Use hand sanitizer if soap and water are not available. SEEK MEDICAL CARE IF:  You have a fever.  You are losing sleep because you cannot control your cough with cough medicine. SEEK IMMEDIATE MEDICAL CARE IF:  You have worsening shortness of breath.  You have increased chest pain.  Your sickness becomes worse, especially if you are an older adult or have a weakened immune system.  You cough up blood.   This information is not intended to replace advice given to you by your health care provider. Make sure you discuss any questions you have with your health care provider.   Document Released: 02/20/2005 Document Revised: 11/11/2014 Document Reviewed: 06/17/2014 Elsevier Interactive Patient Education [employer].

## 2015-01-20 ENCOUNTER — Ambulatory Visit (INDEPENDENT_AMBULATORY_CARE_PROVIDER_SITE_OTHER): Payer: Managed Care, Other (non HMO) | Admitting: Family Medicine

## 2015-01-20 VITALS — BP 118/76 | HR 86 | Temp 98.4°F | Resp 18 | Ht 68.0 in | Wt 221.0 lb

## 2015-01-20 DIAGNOSIS — R011 Cardiac murmur, unspecified: Secondary | ICD-10-CM | POA: Diagnosis not present

## 2015-01-20 DIAGNOSIS — J189 Pneumonia, unspecified organism: Secondary | ICD-10-CM | POA: Diagnosis not present

## 2015-01-20 LAB — POCT CBC
Granulocyte percent: 68.5 %G (ref 37–80)
HCT, POC: 25.2 % — AB (ref 43.5–53.7)
Hemoglobin: 7.6 g/dL — AB (ref 14.1–18.1)
Lymph, poc: 1.6 (ref 0.6–3.4)
MCH, POC: 20 pg — AB (ref 27–31.2)
MCHC: 30.2 g/dL — AB (ref 31.8–35.4)
MCV: 66.3 fL — AB (ref 80–97)
MID (cbc): 0.7 (ref 0–0.9)
MPV: 8.3 fL (ref 0–99.8)
POC Granulocyte: 4.9 (ref 2–6.9)
POC LYMPH PERCENT: 22 %L (ref 10–50)
POC MID %: 9.5 %M (ref 0–12)
Platelet Count, POC: 390 10*3/uL (ref 142–424)
RBC: 3.81 M/uL — AB (ref 4.69–6.13)
RDW, POC: 17.8 %
WBC: 7.1 10*3/uL (ref 4.6–10.2)

## 2015-01-20 LAB — POCT SEDIMENTATION RATE: POCT SED RATE: 117 mm/hr — AB (ref 0–22)

## 2015-01-20 MED ORDER — CEFTRIAXONE SODIUM 1 G IJ SOLR
1.0000 g | Freq: Once | INTRAMUSCULAR | Status: AC
  Administered 2015-01-20: 1 g via INTRAMUSCULAR

## 2015-01-20 MED ORDER — HYDROCOD POLST-CPM POLST ER 10-8 MG/5ML PO SUER: 5.0000 mL | Freq: Two times a day (BID) | ORAL | Status: DC | PRN

## 2015-01-20 NOTE — Patient Instructions (Signed)

## 2015-01-20 NOTE — Progress Notes (Signed)
@[email or web address] @  This chart was scribed for Bradley Haber, MD by Thea Alken, ED Scribe. This patient was seen in room 10 and the patient's care was started at 3:54 PM.  Patient ID: Bradley [name] MRN: [id number], DOB: June 09, 1970, 44 y.o. Date of Encounter: 01/20/2015, 3:54 PM  Primary Physician: [name], MD  Chief Complaint:  Chief Complaint  Patient presents with  . Follow-up    CAP     HPI: 44 y.o. year old male with history below presents for a recheck. Pt was seen here 2 days ago for a cough. Was prescribed levaquin and tessalon. since last visit, pt states he has not gotten much better. He still has a productive cough worse at night causing him to have to sleep upward.  He reports sweats during the night and CP with cough. Medication prescribed at last visit was ineffective.     He as hx of gastric bypass.     Past Medical History  Diagnosis Date  . Hypertension   . Kidney stone   . Anxiety   . Depression   . Displacement of intervertebral disc, site unspecified, without myelopathy   . Other, mixed, or unspecified nondependent drug abuse, unspecified   . Priapism   . Arthropathy, unspecified, other specified sites   . Lumbago   . Contact with or exposure to venereal diseases   . Personal history of urinary calculi   . Obesity, unspecified      Home Meds: Prior to Admission medications   Medication Sig Start Date End Date Taking? Authorizing Provider  acetaminophen (TYLENOL) 500 MG tablet Take 1,000 mg by mouth every 6 (six) hours as needed for mild pain.    Historical Provider, MD  benzonatate (TESSALON) 200 MG capsule Take 1 capsule (200 mg total) by mouth 2 (two) times daily as needed for cough. 01/18/15   Bradley Haber, MD  ibuprofen (ADVIL,MOTRIN) 200 MG tablet Take 800 mg by mouth every 6 (six) hours as needed for moderate pain.    Historical Provider, MD  levofloxacin (LEVAQUIN) 500 MG tablet Take 1 tablet (500 mg total) by mouth daily. 01/18/15   Bradley Haber, MD  lisinopril-hydrochlorothiazide (PRINZIDE,ZESTORETIC) 20-25 MG per tablet Take 1 tablet by mouth daily. Patient not taking: Reported on 01/18/2015 09/29/14   [name], PA-C  Multiple Vitamin (MULTIVITAMIN WITH MINERALS) TABS tablet Take 1 tablet by mouth daily.    Historical Provider, MD  pseudoephedrine (SUDAFED) 60 MG tablet Take 1 tablet (60 mg total) by mouth every 4 (four) hours as needed (priapism). 06/03/13   [name], MD    Allergies:  No Known Allergies  Social History   Social History  . Marital Status: Married    Spouse Name: N/A  . Number of Children: N/A  . Years of Education: N/A   Occupational History  . Not on file.   Social History Main Topics  . Smoking status: Never Smoker   . Smokeless tobacco: Never Used  . Alcohol Use: No  . Drug Use: No  . Sexual Activity: Yes   Other Topics Concern  . Not on file   Social History Narrative     Review of Systems: Constitutional: negative for chills, fever, weight changes, or fatigue  HEENT: negative for vision changes, hearing loss, congestion, rhinorrhea, ST, epistaxis, or sinus pressure Cardiovascular: negative for chest pain or palpitations Respiratory: negative for hemoptysis, wheezing, shortness of breath. Abdominal: negative for abdominal pain, nausea, vomiting, diarrhea, or constipation Dermatological: negative for rash Neurologic: negative  for headache, dizziness, or syncope All other systems reviewed and are otherwise negative with the exception to those above and in the HPI.   Physical Exam: Blood pressure 118/76, pulse 86, temperature 98.4 F (36.9 C), temperature source Oral, resp. rate 18, height 5' 8"  (1.727 m), weight 221 lb (100.245 kg), SpO2 97 %., Body mass index is 33.61 kg/(m^2). General: Well developed, well nourished, in no acute distress. Head: Normocephalic, atraumatic, eyes without discharge, sclera non-icteric, nares are without discharge. Bilateral auditory canals  clear, TM's are without perforation, pearly grey and translucent with reflective cone of light bilaterally. Oral cavity moist, posterior pharynx without exudate, erythema, peritonsillar abscess, or post nasal drip.  Neck: Supple. No thyromegaly. Full ROM. No lymphadenopathy. Lungs: Clear bilaterally to auscultation without wheezes, rales, or rhonchi. Breathing is unlabored. Heart: RRR with S1 S2. 2/6 systolic murmur right sternal border. No rubs, or gallops appreciated. Abdomen: Soft, non-tender, non-distended with normoactive bowel sounds. No hepatomegaly. No rebound/guarding. No obvious abdominal masses. Msk:  Strength and tone normal for age. Extremities/Skin: Warm and dry. No clubbing or cyanosis. No edema. No rashes or suspicious lesions. Neuro: Alert and oriented X 3. Moves all extremities spontaneously. Gait is normal. CNII-XII grossly in tact. Psych:  Responds to questions appropriately with a normal affect.   Labs: Results for orders placed or performed in visit on 01/20/15  POCT CBC  Result Value Ref Range   WBC 7.1 4.6 - 10.2 K/uL   Lymph, poc 1.6 0.6 - 3.4   POC LYMPH PERCENT 22.0 10 - 50 %L   MID (cbc) 0.7 0 - 0.9   POC MID % 9.5 0 - 12 %M   POC Granulocyte 4.9 2 - 6.9   Granulocyte percent 68.5 37 - 80 %G   RBC 3.81 (A) 4.69 - 6.13 M/uL   Hemoglobin 7.6 (A) 14.1 - 18.1 g/dL   HCT, POC 25.2 (A) 43.5 - 53.7 %   MCV 66.3 (A) 80 - 97 fL   MCH, POC 20.0 (A) 27 - 31.2 pg   MCHC 30.2 (A) 31.8 - 35.4 g/dL   RDW, POC 17.8 %   Platelet Count, POC 390 142 - 424 K/uL   MPV 8.3 0 - 99.8 fL   ASSESSMENT AND PLAN:  44 y.o. year old male with     ICD-9-CM ICD-10-CM   1. CAP (community acquired pneumonia) 486 J18.9 POCT CBC     chlorpheniramine-HYDROcodone (TUSSIONEX PENNKINETIC ER) 10-8 MG/5ML SUER     POCT SEDIMENTATION RATE     cefTRIAXone (ROCEPHIN) injection 1 g     CANCELED: POCT rapid strep A  2. Heart murmur 785.2 R01.1 Ambulatory referral to Cardiology     POCT  SEDIMENTATION RATE    By signing my name below, I, [name], attest that this documentation has been prepared under the direction and in the presence of Bradley Haber, MD.  Electronically Signed: Thea Alken, ED Scribe. 01/20/2015. 3:54 PM.  Signed, Bradley Haber, MD 01/20/2015 3:54 PM

## 2015-01-21 ENCOUNTER — Other Ambulatory Visit: Payer: Self-pay | Admitting: Family Medicine

## 2015-01-21 DIAGNOSIS — J189 Pneumonia, unspecified organism: Secondary | ICD-10-CM

## 2015-01-21 DIAGNOSIS — J181 Lobar pneumonia, unspecified organism: Principal | ICD-10-CM

## 2015-01-21 DIAGNOSIS — R700 Elevated erythrocyte sedimentation rate: Secondary | ICD-10-CM

## 2015-01-26 ENCOUNTER — Other Ambulatory Visit: Payer: Self-pay | Admitting: Family Medicine

## 2015-01-26 DIAGNOSIS — R9389 Abnormal findings on diagnostic imaging of other specified body structures: Secondary | ICD-10-CM

## 2015-01-27 ENCOUNTER — Ambulatory Visit (INDEPENDENT_AMBULATORY_CARE_PROVIDER_SITE_OTHER): Payer: Managed Care, Other (non HMO)

## 2015-01-27 ENCOUNTER — Encounter: Payer: Self-pay | Admitting: Family Medicine

## 2015-01-27 ENCOUNTER — Ambulatory Visit (INDEPENDENT_AMBULATORY_CARE_PROVIDER_SITE_OTHER): Payer: Managed Care, Other (non HMO) | Admitting: Family Medicine

## 2015-01-27 VITALS — BP 118/78 | HR 79 | Temp 98.3°F | Resp 18 | Wt 210.0 lb

## 2015-01-27 DIAGNOSIS — J189 Pneumonia, unspecified organism: Secondary | ICD-10-CM

## 2015-01-27 DIAGNOSIS — J181 Lobar pneumonia, unspecified organism: Principal | ICD-10-CM

## 2015-01-27 DIAGNOSIS — R700 Elevated erythrocyte sedimentation rate: Secondary | ICD-10-CM

## 2015-01-27 LAB — POCT CBC
Granulocyte percent: 54.3 %G (ref 37–80)
HCT, POC: 28.8 % — AB (ref 43.5–53.7)
Hemoglobin: 8.7 g/dL — AB (ref 14.1–18.1)
Lymph, poc: 2.4 (ref 0.6–3.4)
MCH, POC: 19.3 pg — AB (ref 27–31.2)
MCHC: 30.3 g/dL — AB (ref 31.8–35.4)
MCV: 63.5 fL — AB (ref 80–97)
MID (cbc): 0.5 (ref 0–0.9)
MPV: 7.4 fL (ref 0–99.8)
POC Granulocyte: 3.4 (ref 2–6.9)
POC LYMPH PERCENT: 37.9 %L (ref 10–50)
POC MID %: 7.8 %M (ref 0–12)
Platelet Count, POC: 521 10*3/uL — AB (ref 142–424)
RBC: 4.54 M/uL — AB (ref 4.69–6.13)
RDW, POC: 17.4 %
WBC: 6.3 10*3/uL (ref 4.6–10.2)

## 2015-01-27 MED ORDER — HYDROCOD POLST-CPM POLST ER 10-8 MG/5ML PO SUER: 5.0000 mL | Freq: Two times a day (BID) | ORAL | Status: DC | PRN

## 2015-01-27 MED ORDER — PREDNISONE 20 MG PO TABS: ORAL_TABLET | ORAL | Status: DC

## 2015-01-27 NOTE — Progress Notes (Addendum)
This chart was scribed for [name], MD by Moises Blood, medical scribe at [facility].The patient was seen in exam room 3 and the patient's care was started at 4:50 PM.  Patient ID: Bradley [name] MRN: [id number], DOB: 1970/04/29, 44 y.o. Date of Encounter: 01/27/2015  Primary Physician: [name], MD  Chief Complaint:  Chief Complaint  Patient presents with   Follow-up    HPI:  Bradley [name] is a 44 y.o. male who presents to [facility] for follow up of right lower lobe pneumonia. He's continuing to cough and have congestion. He's somewhat short of breath with activity. He denies chest pain and hemoptysis.   Past Medical History  Diagnosis Date   Hypertension    Kidney stone    Anxiety    Depression    Displacement of intervertebral disc, site unspecified, without myelopathy    Other, mixed, or unspecified nondependent drug abuse, unspecified    Priapism    Arthropathy, unspecified, other specified sites    Lumbago    Contact with or exposure to venereal diseases    Personal history of urinary calculi    Obesity, unspecified      Home Meds: Prior to Admission medications   Medication Sig Start Date End Date Taking? Authorizing Provider  acetaminophen (TYLENOL) 500 MG tablet Take 1,000 mg by mouth every 6 (six) hours as needed for mild pain.    Historical Provider, MD  benzonatate (TESSALON) 200 MG capsule Take 1 capsule (200 mg total) by mouth 2 (two) times daily as needed for cough. 01/18/15   [name], MD  chlorpheniramine-HYDROcodone [facility] ER) 10-8 MG/5ML SUER Take 5 mLs by mouth every 12 (twelve) hours as needed for cough. 01/20/15   [name], MD  ibuprofen (ADVIL,MOTRIN) 200 MG tablet Take 800 mg by mouth every 6 (six) hours as needed for moderate pain.    Historical Provider, MD  levofloxacin (LEVAQUIN) 500 MG tablet Take 1 tablet (500 mg total) by mouth daily. 01/18/15    [name], MD  lisinopril-hydrochlorothiazide (PRINZIDE,ZESTORETIC) 20-25 MG per tablet Take 1 tablet by mouth daily. Patient not taking: Reported on 01/18/2015 09/29/14   [name], PA-C  Multiple Vitamin (MULTIVITAMIN WITH MINERALS) TABS tablet Take 1 tablet by mouth daily.    Historical Provider, MD  pseudoephedrine (SUDAFED) 60 MG tablet Take 1 tablet (60 mg total) by mouth every 4 (four) hours as needed (priapism). 06/03/13   [name], MD    Allergies: No Known Allergies  Social History   Social History   Marital Status: Married    Spouse Name: N/A   Number of Children: N/A   Years of Education: N/A   Occupational History   Not on file.   Social History Main Topics   Smoking status: Never Smoker    Smokeless tobacco: Never Used   Alcohol Use: No   Drug Use: No   Sexual Activity: Yes   Other Topics Concern   Not on file   Social History Narrative     Review of Systems: Constitutional: negative for fever, chills, night sweats, weight changes, or fatigue  HEENT: negative for vision changes, hearing loss, congestion, rhinorrhea, ST, epistaxis, or sinus pressure Cardiovascular: negative for chest pain or palpitations Respiratory: negative for hemoptysis, wheezing, shortness of breath; positive for cough Abdominal: negative for abdominal pain, nausea, vomiting, diarrhea, or constipation Dermatological: negative for rash Neurologic: negative for headache, dizziness, or syncope All other systems reviewed and  are otherwise negative with the exception to those above and in the HPI.  Physical Exam: Blood pressure 118/78, pulse 79, temperature 98.3 F (36.8 C), temperature source Oral, resp. rate 18, weight 210 lb (95.255 kg), SpO2 97 %., Body mass index is 31.94 kg/(m^2). General: Well developed, well nourished, in no acute distress. Head: Normocephalic, atraumatic, eyes without discharge, sclera non-icteric, nares are without discharge. Bilateral  auditory canals clear, TM's are without perforation, pearly grey and translucent with reflective cone of light bilaterally. Oral cavity moist, posterior pharynx without exudate, erythema, peritonsillar abscess, or post nasal drip.  Neck: Supple. No thyromegaly. Full ROM. No lymphadenopathy. Lungs: Clear bilaterally to auscultation without wheezes, rales, or rhonchi. Breathing is unlabored. Heart: RRR with S1 S2. No murmurs, rubs, or gallops appreciated. Msk:  Strength and tone normal for age. Extremities/Skin: Warm and dry. No clubbing or cyanosis. No edema. No rashes or suspicious lesions. Neuro: Alert and oriented X 3. Moves all extremities spontaneously. Gait is normal. CNII-XII grossly in tact. Psych:  Responds to questions appropriately with a normal affect.   Labs: Results for orders placed or performed in visit on 01/27/15  POCT CBC  Result Value Ref Range   WBC 6.3 4.6 - 10.2 K/uL   Lymph, poc 2.4 0.6 - 3.4   POC LYMPH PERCENT 37.9 10 - 50 %L   MID (cbc) 0.5 0 - 0.9   POC MID % 7.8 0 - 12 %M   POC Granulocyte 3.4 2 - 6.9   Granulocyte percent 54.3 37 - 80 %G   RBC 4.54 (A) 4.69 - 6.13 M/uL   Hemoglobin 8.7 (A) 14.1 - 18.1 g/dL   HCT, POC 28.8 (A) 43.5 - 53.7 %   MCV 63.5 (A) 80 - 97 fL   MCH, POC 19.3 (A) 27 - 31.2 pg   MCHC 30.3 (A) 31.8 - 35.4 g/dL   RDW, POC 17.4 %   Platelet Count, POC 521 (A) 142 - 424 K/uL   MPV 7.4 0 - 99.8 fL   [facility] reading (PRIMARY) by  Dr. Joseph Art: significant clearing of RLL infiltrate.    ASSESSMENT AND PLAN:  44 y.o. year old male with  This chart was scribed in my presence and reviewed by me personally.    ICD-9-CM ICD-10-CM   1. Right lower lobe pneumonia 486 J18.9 DG Chest 2 View     POCT CBC     chlorpheniramine-HYDROcodone (TUSSIONEX PENNKINETIC ER) 10-8 MG/5ML SUER     predniSONE (DELTASONE) 20 MG tablet     CT Chest W Contrast  2. Elevated sed rate 790.1 R70.0 POCT CBC     CT Chest W Contrast  3. CAP (community acquired  pneumonia) 486 J18.9 chlorpheniramine-HYDROcodone (TUSSIONEX PENNKINETIC ER) 10-8 MG/5ML SUER     predniSONE (DELTASONE) 20 MG tablet     CT Chest W Contrast      By signing my name below, I, Moises Blood, attest that this documentation has been prepared under the direction and in the presence of [name], MD. Electronically Signed: Moises Blood, [place]. 01/27/2015 , 5:27 PM .  Signed, [name], MD 01/27/2015 5:27 PM

## 2015-02-01 ENCOUNTER — Ambulatory Visit (INDEPENDENT_AMBULATORY_CARE_PROVIDER_SITE_OTHER): Payer: Managed Care, Other (non HMO) | Admitting: Family Medicine

## 2015-02-01 ENCOUNTER — Telehealth: Payer: Self-pay

## 2015-02-01 ENCOUNTER — Other Ambulatory Visit: Payer: Self-pay | Admitting: Family Medicine

## 2015-02-01 VITALS — BP 145/97 | HR 75 | Temp 98.3°F | Resp 18 | Ht 69.0 in | Wt 208.0 lb

## 2015-02-01 DIAGNOSIS — J4541 Moderate persistent asthma with (acute) exacerbation: Secondary | ICD-10-CM | POA: Diagnosis not present

## 2015-02-01 DIAGNOSIS — J189 Pneumonia, unspecified organism: Secondary | ICD-10-CM

## 2015-02-01 DIAGNOSIS — R059 Cough, unspecified: Secondary | ICD-10-CM

## 2015-02-01 DIAGNOSIS — R05 Cough: Secondary | ICD-10-CM | POA: Diagnosis not present

## 2015-02-01 DIAGNOSIS — J181 Lobar pneumonia, unspecified organism: Secondary | ICD-10-CM

## 2015-02-01 MED ORDER — HYDROCOD POLST-CPM POLST ER 10-8 MG/5ML PO SUER: 5.0000 mL | Freq: Two times a day (BID) | ORAL | Status: DC | PRN

## 2015-02-01 MED ORDER — METHYLPREDNISOLONE ACETATE 80 MG/ML IJ SUSP
120.0000 mg | Freq: Once | INTRAMUSCULAR | Status: AC
  Administered 2015-02-01: 120 mg via INTRAMUSCULAR

## 2015-02-01 NOTE — Progress Notes (Signed)
@[facility] @  This chart was scribed for [name], MD by Thea Alken, ED Scribe. This patient was seen in room 10 and the patient's care was started at 7:02 PM.  Patient ID: Bradley [name] MRN: [id number], DOB: [birth year], 44 y.o. Date of Encounter: 02/01/2015, 6:48 PM  Primary Physician: [name], MD  Chief Complaint:  Chief Complaint  Patient presents with  . Follow-up    pneumonia    HPI: 44 y.o. year old male with history below presents for a follow up of right lower lobe pneumonia. Pt still has cough and chest congestion. He is needing a medication refill of Tussionex. CT scan has been rescheduled. Pt is not a smoker.    Past Medical History  Diagnosis Date  . Hypertension   . Kidney stone   . Anxiety   . Depression   . Displacement of intervertebral disc, site unspecified, without myelopathy   . Other, mixed, or unspecified nondependent drug abuse, unspecified   . Priapism   . Arthropathy, unspecified, other specified sites   . Lumbago   . Contact with or exposure to venereal diseases   . Personal history of urinary calculi   . Obesity, unspecified      Home Meds: Prior to Admission medications   Medication Sig Start Date End Date Taking? Authorizing Provider  acetaminophen (TYLENOL) 500 MG tablet Take 1,000 mg by mouth every 6 (six) hours as needed for mild pain.   Yes Historical Provider, MD  chlorpheniramine-HYDROcodone (TUSSIONEX PENNKINETIC ER) 10-8 MG/5ML SUER Take 5 mLs by mouth every 12 (twelve) hours as needed for cough. 02/01/15  Yes [name], MD  ibuprofen (ADVIL,MOTRIN) 200 MG tablet Take 800 mg by mouth every 6 (six) hours as needed for moderate pain.   Yes Historical Provider, MD  levofloxacin (LEVAQUIN) 500 MG tablet Take 1 tablet (500 mg total) by mouth daily. 01/18/15  Yes [name], MD  lisinopril-hydrochlorothiazide (PRINZIDE,ZESTORETIC) 20-25 MG per tablet Take 1 tablet by mouth daily. 09/29/14  Yes [name], PA-C  Multiple  Vitamin (MULTIVITAMIN WITH MINERALS) TABS tablet Take 1 tablet by mouth daily.   Yes Historical Provider, MD  predniSONE (DELTASONE) 20 MG tablet Two daily with food 01/27/15  Yes [name], MD  pseudoephedrine (SUDAFED) 60 MG tablet Take 1 tablet (60 mg total) by mouth every 4 (four) hours as needed (priapism). 06/03/13  Yes [name], MD    Allergies: No Known Allergies  Social History   Social History  . Marital Status: Married    Spouse Name: N/A  . Number of Children: N/A  . Years of Education: N/A   Occupational History  . Not on file.   Social History Main Topics  . Smoking status: Never Smoker   . Smokeless tobacco: Never Used  . Alcohol Use: No  . Drug Use: No  . Sexual Activity: Yes   Other Topics Concern  . Not on file   Social History Narrative     Review of Systems: Constitutional: negative for chills, fever, night sweats, weight changes, or fatigue  HEENT: negative for vision changes, hearing loss, congestion, rhinorrhea, ST, epistaxis, or sinus pressure Cardiovascular: negative for chest pain or palpitations Respiratory: negative for hemoptysis, wheezing Abdominal: negative for abdominal pain, nausea, vomiting, diarrhea, or constipation Dermatological: negative for rash Neurologic: negative for headache, dizziness, or syncope All other systems reviewed and are otherwise negative with the exception to those above and in the HPI.   Physical Exam: Blood pressure 145/97, pulse 75, temperature 98.3  F (36.8 C), temperature source Oral, resp. rate 18, height 5' 9"  (1.753 m), weight 208 lb (94.348 kg), SpO2 98 %., Body mass index is 30.7 kg/(m^2). General: Well developed, well nourished, in no acute distress. He is mildly pale Head: Normocephalic, atraumatic, eyes without discharge, sclera non-icteric, nares are without discharge. Bilateral auditory canals clear, TM's are without perforation, pearly grey and translucent with reflective cone of light  bilaterally. Oral cavity moist, posterior pharynx without exudate, erythema, peritonsillar abscess, or post nasal drip.  Neck: Supple. No thyromegaly. Full ROM. No lymphadenopathy. Lungs: Diffuse wheezes bilaterally on inspiration and expiration. Patient has persistent cough during the interview. Heart: RRR with S1 S2. No murmurs, rubs, or gallops appreciated. Abdomen: Soft, non-tender, non-distended with normoactive bowel sounds. No hepatomegaly. No rebound/guarding. No obvious abdominal masses. Msk:  Strength and tone normal for age. Extremities/Skin: Warm and dry. No clubbing or cyanosis. No edema. No rashes or suspicious lesions. Neuro: Alert and oriented X 3. Moves all extremities spontaneously. Gait is normal. CNII-XII grossly in tact. Psych:  Responds to questions appropriately with a normal affect.      ASSESSMENT AND PLAN:  44 y.o. year old male with persistent coughing and wheezing and nonsmoker.  This chart was scribed in my presence and reviewed by me personally.    ICD-9-CM ICD-10-CM   1. Cough 786.2 R05 methylPREDNISolone acetate (DEPO-MEDROL) injection 120 mg  2. Asthma, moderate persistent, with acute exacerbation 493.92 J45.41 methylPREDNISolone acetate (DEPO-MEDROL) injection 120 mg        By signing my name below, I, [name], attest that this documentation has been prepared under the direction and in the presence of [name], MD.  Electronically Signed: Thea Alken, ED Scribe. 02/01/2015. 7:07 PM.  Signed, [name], MD 02/01/2015 6:48 PM

## 2015-02-01 NOTE — Telephone Encounter (Signed)
Patient is requesting a refill on his  Medication   chlorpheniramine-HYDROcodone [facility] ER) 10-8 MG/5ML SUER [[id number]]       chlorpheniramine-HYDROcodone (TUSSIONEX PENNKINETIC ER) 10-8 MG/5ML Latanya Presser [[id number]]      He is still coughing heavily and is out of medication.

## 2015-02-02 ENCOUNTER — Ambulatory Visit (HOSPITAL_COMMUNITY): Payer: PRIVATE HEALTH INSURANCE

## 2015-02-02 ENCOUNTER — Encounter (HOSPITAL_BASED_OUTPATIENT_CLINIC_OR_DEPARTMENT_OTHER): Payer: Self-pay

## 2015-02-02 ENCOUNTER — Telehealth: Payer: Self-pay | Admitting: *Deleted

## 2015-02-02 ENCOUNTER — Ambulatory Visit (HOSPITAL_BASED_OUTPATIENT_CLINIC_OR_DEPARTMENT_OTHER)
Admission: RE | Admit: 2015-02-02 | Discharge: 2015-02-02 | Disposition: A | Discharge status: Home / Self Care | Payer: Managed Care, Other (non HMO) | Source: Ambulatory Visit | Attending: Family Medicine | Admitting: Family Medicine

## 2015-02-02 ENCOUNTER — Telehealth: Payer: Self-pay

## 2015-02-02 DIAGNOSIS — R918 Other nonspecific abnormal finding of lung field: Secondary | ICD-10-CM | POA: Insufficient documentation

## 2015-02-02 DIAGNOSIS — I2510 Atherosclerotic heart disease of native coronary artery without angina pectoris: Secondary | ICD-10-CM | POA: Insufficient documentation

## 2015-02-02 DIAGNOSIS — R0989 Other specified symptoms and signs involving the circulatory and respiratory systems: Secondary | ICD-10-CM | POA: Diagnosis not present

## 2015-02-02 DIAGNOSIS — R938 Abnormal findings on diagnostic imaging of other specified body structures: Secondary | ICD-10-CM | POA: Diagnosis not present

## 2015-02-02 DIAGNOSIS — Z8701 Personal history of pneumonia (recurrent): Secondary | ICD-10-CM | POA: Insufficient documentation

## 2015-02-02 DIAGNOSIS — R5383 Other fatigue: Secondary | ICD-10-CM | POA: Insufficient documentation

## 2015-02-02 DIAGNOSIS — R05 Cough: Secondary | ICD-10-CM | POA: Diagnosis not present

## 2015-02-02 DIAGNOSIS — R9389 Abnormal findings on diagnostic imaging of other specified body structures: Secondary | ICD-10-CM

## 2015-02-02 MED ORDER — IOHEXOL 300 MG/ML  SOLN
80.0000 mL | Freq: Once | INTRAMUSCULAR | Status: AC | PRN
Start: 2015-02-02 — End: 2015-02-02
  Administered 2015-02-02: 80 mL via INTRAVENOUS

## 2015-02-02 MED ORDER — LEVOFLOXACIN 500 MG PO TABS: 500.0000 mg | ORAL_TABLET | Freq: Every day | ORAL | Status: DC

## 2015-02-02 NOTE — Telephone Encounter (Signed)
Pt notified of results

## 2015-02-02 NOTE — Telephone Encounter (Signed)
Called Dr. Joseph Art CT report available to review. Please advise. Bradley [name] will be unavailable from 2p-5p, ok to leave message

## 2015-02-02 NOTE — Telephone Encounter (Signed)
Patient is calling for [place] to get CT results. Patient states that she can leave a message since he has to go to court at 2

## 2015-02-02 NOTE — Telephone Encounter (Signed)
LMOM on machine for pt to cb.  Per Dr. Joseph Art let pt know that there is still a little pneumonia.  We are calling in an rx for levaquin.  We are sending him to a pulmonologist and he should here something from either [place] or the office we are referring him too with an appt date and time.

## 2015-02-03 ENCOUNTER — Other Ambulatory Visit: Payer: Self-pay | Admitting: Family Medicine

## 2015-02-03 DIAGNOSIS — J189 Pneumonia, unspecified organism: Secondary | ICD-10-CM

## 2015-02-03 NOTE — Telephone Encounter (Signed)
I just refilled this 2 days ago. He'll need to get further treatment from the specialist

## 2015-02-04 ENCOUNTER — Other Ambulatory Visit: Payer: Self-pay | Admitting: Neurosurgery

## 2015-02-04 DIAGNOSIS — M5416 Radiculopathy, lumbar region: Secondary | ICD-10-CM

## 2015-02-05 ENCOUNTER — Telehealth: Payer: Self-pay | Admitting: Family Medicine

## 2015-02-05 ENCOUNTER — Ambulatory Visit (INDEPENDENT_AMBULATORY_CARE_PROVIDER_SITE_OTHER): Payer: Managed Care, Other (non HMO) | Admitting: Internal Medicine

## 2015-02-05 ENCOUNTER — Encounter: Payer: Self-pay | Admitting: Internal Medicine

## 2015-02-05 VITALS — BP 128/88 | HR 75 | Ht 69.0 in | Wt 216.4 lb

## 2015-02-05 DIAGNOSIS — J45991 Cough variant asthma: Secondary | ICD-10-CM

## 2015-02-05 DIAGNOSIS — R05 Cough: Secondary | ICD-10-CM | POA: Diagnosis not present

## 2015-02-05 DIAGNOSIS — R059 Cough, unspecified: Secondary | ICD-10-CM | POA: Insufficient documentation

## 2015-02-05 MED ORDER — TRAMADOL HCL 50 MG PO TABS: 50.0000 mg | ORAL_TABLET | ORAL | Status: DC

## 2015-02-05 MED ORDER — AMOXICILLIN-POT CLAVULANATE 875-125 MG PO TABS: 1.0000 | ORAL_TABLET | Freq: Two times a day (BID) | ORAL | Status: DC

## 2015-02-05 MED ORDER — MOMETASONE FURO-FORMOTEROL FUM 100-5 MCG/ACT IN AERO: INHALATION_SPRAY | RESPIRATORY_TRACT | Status: DC

## 2015-02-05 NOTE — Telephone Encounter (Signed)
Patient request refill on Tussinex. He has appt with Dr. Melvyn Novas (pulmonogist) today at 230. I advised him to discuss with Dr. Melvyn Novas. If he still needed let us know

## 2015-02-05 NOTE — Patient Instructions (Addendum)
Wait a couple of days and if the mucus is still nasty Augmentin 875 mg take one pill twice daily  X 10 days - take at breakfast and supper with large glass of water.  It would help reduce the usual side effects (diarrhea and yeast infections) if you ate cultured yogurt at lunch.   dulera 100 Take 2 puffs first thing in am and then another 2 puffs about 12 hours later.   Work on inhaler technique:  relax and gently blow all the way out then take a nice smooth deep breath back in, triggering the inhaler at same time you start breathing in.  Hold for up to 5 seconds if you can. Blow out thru nose. Rinse and gargle with water when done   Take mucinex dm 1200 mg every 12 hours and supplement if needed with  tramadol 50 mg up to 2 every 4 hours to suppress the urge to cough. Swallowing water or using ice chips/non mint and menthol containing candies (such as lifesavers or sugarless jolly ranchers) are also effective.  You should rest your voice and avoid activities that you know make you cough.  Once you have eliminated the cough for 3 straight days try reducing the tramadol first,  then the mucinex dm  as tolerated.    Try prilosec otc 62m  Take 30-60 min before first meal of the day and Pepcid ac (famotidine) 20 mg one @  bedtime until cough is completely gone for at least a week without the need for cough suppression     GERD (REFLUX)  is an extremely common cause of respiratory symptoms just like yours , many times with no obvious heartburn at all.    It can be treated with medication, but also with lifestyle changes including elevation of the head of your bed (ideally with 6 inch  bed blocks),  Smoking cessation, avoidance of late meals, excessive alcohol, and avoid fatty foods, chocolate, peppermint, colas, red wine, and acidic juices such as orange juice.  NO MINT OR MENTHOL PRODUCTS SO NO COUGH DROPS  USE SUGARLESS CANDY INSTEAD (Jolley ranchers or Stover's or Life Savers) or even ice chips will  also do - the key is to swallow to prevent all throat clearing. NO OIL BASED VITAMINS - use powdered substitutes.  Please schedule a follow up office visit in 2 weeks, sooner if needed

## 2015-02-05 NOTE — Progress Notes (Signed)
Subjective:    Patient ID: Bradley [name], male    DOB: 1970-08-09,    MRN: [id number]  HPI   50 yowm never smoker freq air travel acutely ill while in [place] on Nov 9th which was 3 d after flying up by commercial jet with fever, congested cough light green mucus > eval on 11/14/6 at [facility] with Tmax 102.7 and rx rocephin/ levaquin but persistent cough and sob no fever > referred to pulmonary 02/05/2015 by Dr Joseph Art.    02/05/2015 1st [facility] office visit/ Bradley [name]   Chief Complaint  Patient presents with  . PULMONARY CONSULT    [name] continued cough with green mucus, wheeze and SOB since diagnosed with pneumonia 4 weeks ago. Pt has been on 3 abx and 1 round of oral pred. Pt states that his symptoms are not improving.   one more day  levquin remaining and mucus is turning less dark / never bloody but still severe coughing fits 24/7 and sob with activity and with severe cough  c persistent nasal congestion as well   No obvious other patterns in day to day or daytime variabilty or assoc chronic cough or cp or chest tightness, subjective wheeze overt sinus or hb symptoms. No unusual exp hx or h/o childhood pna/ asthma or knowledge of premature birth.  Sleeping ok without nocturnal  or early am exacerbation  of respiratory  c/o's or need for noct saba. Also denies any obvious fluctuation of symptoms with weather or environmental changes or other aggravating or alleviating factors except as outlined above   Current Medications, Allergies, Complete Past Medical History, Past Surgical History, Family History, and Social History were reviewed in [employer] record.              Review of Systems  Constitutional: Negative for fever and unexpected weight change.  HENT: Positive for ear pain and sinus pressure. Negative for congestion, dental problem, nosebleeds, postnasal drip, rhinorrhea, sneezing, sore throat and trouble swallowing.   Eyes: Negative.  Negative for  redness and itching.  Respiratory: Positive for cough, shortness of breath and wheezing. Negative for chest tightness.   Cardiovascular: Negative.  Negative for palpitations and leg swelling.  Gastrointestinal: Negative.  Negative for nausea and vomiting.  Endocrine: Negative.   Genitourinary: Negative.  Negative for dysuria.  Musculoskeletal: Negative.  Negative for joint swelling.  Skin: Negative.  Negative for rash.  Allergic/Immunologic: Negative.   Neurological: Positive for headaches.  Hematological: Negative.  Does not bruise/bleed easily.  Psychiatric/Behavioral: Negative.  Negative for dysphoric mood. The patient is not nervous/anxious.        Objective:   Physical Exam  amb wm nad  Wt Readings from Last 3 Encounters:  02/05/15 216 lb 6.4 oz (98.158 kg)  02/01/15 208 lb (94.348 kg)  01/27/15 210 lb (95.255 kg)    Vital signs reviewed   HEENT: nl dentition, turbinates, and oropharynx. Nl external ear canals without cough reflex   NECK :  without JVD/Nodes/TM/ nl carotid upstrokes bilaterally   LUNGS: no acc muscle use,  Nl contour chest insp and exp rhonchi with prominent upper airway features    CV:  RRR  no s3 or murmur or increase in P2, no edema   ABD:  soft and nontender with nl inspiratory excursion in the supine position. No bruits or organomegaly, bowel sounds nl  MS:  Nl gait/ ext warm without deformities, calf tenderness, cyanosis or clubbing No obvious joint restrictions   SKIN:  warm and dry without lesions    NEURO:  alert, approp, nl sensorium with  no motor deficits      I personally reviewed images and agree with radiology impression as follows:  CT Chest   02/02/15 1. Minimal residual infiltrate in the right lower lobe and to a lesser degree right middle lobe in this patient who has documented pneumonia on 01/18/2015 in that region. No pleural effusion is seen         Assessment & Plan:

## 2015-02-06 DIAGNOSIS — J45991 Cough variant asthma: Secondary | ICD-10-CM | POA: Insufficient documentation

## 2015-02-06 NOTE — Assessment & Plan Note (Addendum)
Most likely this is now cylical cough/  Upper airway cough syndrome, so named because it's frequently impossible to sort out how much is  CR/sinusitis with freq throat clearing (which can be related to primary GERD)   vs  causing  secondary (" extra esophageal")  GERD from wide swings in gastric pressure that occur with throat clearing, often  promoting self use of mint and menthol lozenges that reduce the lower esophageal sphincter tone and exacerbate the problem further in a cyclical fashion.   These are the same pts (now being labeled as having "irritable larynx syndrome" by some cough centers) who not infrequently have a history of having failed to tolerate ace inhibitors,  dry powder inhalers or biphosphonates or report having atypical reflux symptoms that don't respond to standard doses of PPI , and are easily confused as having aecopd or asthma flares by even experienced allergists/ pulmonologists.   For now eliminate cyclical cough component with tramadol then regroup in 2 weeks with ct sinus next if not better

## 2015-02-06 NOTE — Assessment & Plan Note (Signed)
Not really clear if this is really asthma but whatever it is it's very difficult to control. DDX of  difficult airways management all start with A and  include Adherence, Ace Inhibitors, Acid Reflux, Active Sinus Disease, Alpha 1 Antitripsin deficiency, Anxiety masquerading as Airways dz,  ABPA,  allergy(esp in young), Aspiration (esp in elderly), Adverse effects of meds,  Active smokers, A bunch of PE's (a small clot burden can't cause this syndrome unless there is already severe underlying pulm or vascular dz with poor reserve) plus two Bs  = Bronchiectasis and Beta blocker use..and one C= CHF  Adherence is always the initial "prime suspect" and is a multilayered concern that requires a "trust but verify" approach in every patient - starting with knowing how to use medications, especially inhalers, correctly, keeping up with refills and understanding the fundamental difference between maintenance and prns vs those medications only taken for a very short course and then stopped and not refilled.  - The proper method of use, as well as anticipated side effects, of a metered-dose inhaler are discussed and demonstrated to the patient. Improved effectiveness after extensive coaching during this visit to a level of approximately 75 % from a baseline of 50% so reasonable to try dulera 100 2bid x 2 weeks then regroup   ? Acid (or non-acid) GERD > always difficult to exclude as up to 75% of pts in some series report no assoc GI/ Heartburn symptoms> rec max (24h)  acid suppression and diet restrictions/ reviewed and instructions given in writing.   ? Active sinus dz > if mucus still discolored rec 10 days of augmentin then sinus ct   ? Allergies > very unlikely based on hx/ no more steroids needed.   I had an extended discussion with the patient and wife reviewing all relevant studies completed to date and  lasting 35  minutes of a 60 minute visit    Each maintenance medication was reviewed in detail including  most importantly the difference between maintenance and prns and under what circumstances the prns are to be triggered using an action plan format that is not reflected in the computer generated alphabetically organized AVS.    Please see instructions for details which were reviewed in writing and the patient given a copy highlighting the part that I personally wrote and discussed at today's ov.

## 2015-02-09 NOTE — Telephone Encounter (Signed)
Pt called checking on status of this script-Tussinex. He would like [place] to use  [facility] outpatient pharmacy. CB # [phone]

## 2015-02-10 NOTE — Telephone Encounter (Signed)
Patient states Dr. Melvyn Novas referred him back to Dr. Carlean Jews for the refill on Tussinex.  Please call patient and advise if it was called into [place]  [phone]

## 2015-02-11 NOTE — Telephone Encounter (Signed)
Told patient that he would need to go thru Dr. Melvyn Novas office to get a refill on tussinex

## 2015-02-11 NOTE — Telephone Encounter (Signed)
I am not going to refill the Tussionex.  This is now a pulmonary patient of Dr. Melvyn Novas.  It's up to him how to proceed with the cough problem

## 2015-02-16 ENCOUNTER — Ambulatory Visit
Admission: RE | Admit: 2015-02-16 | Discharge: 2015-02-16 | Disposition: A | Discharge status: Home / Self Care | Payer: Managed Care, Other (non HMO) | Source: Ambulatory Visit | Attending: Neurosurgery | Admitting: Neurosurgery

## 2015-02-16 DIAGNOSIS — M5416 Radiculopathy, lumbar region: Secondary | ICD-10-CM

## 2015-02-16 MED ORDER — DIAZEPAM 5 MG PO TABS
10.0000 mg | ORAL_TABLET | Freq: Once | ORAL | Status: AC
  Administered 2015-02-16: 10 mg via ORAL

## 2015-02-16 MED ORDER — IOHEXOL 180 MG/ML  SOLN
15.0000 mL | Freq: Once | INTRAMUSCULAR | Status: AC | PRN
  Administered 2015-02-16: 15 mL via INTRATHECAL

## 2015-02-16 NOTE — Discharge Instructions (Signed)

## 2015-02-18 ENCOUNTER — Ambulatory Visit (INDEPENDENT_AMBULATORY_CARE_PROVIDER_SITE_OTHER): Payer: Managed Care, Other (non HMO) | Admitting: Internal Medicine

## 2015-02-18 ENCOUNTER — Encounter: Payer: Self-pay | Admitting: Internal Medicine

## 2015-02-18 VITALS — BP 157/102 | HR 78 | Ht 70.0 in | Wt 215.1 lb

## 2015-02-18 DIAGNOSIS — R011 Cardiac murmur, unspecified: Secondary | ICD-10-CM

## 2015-02-18 MED ORDER — ASPIRIN EC 81 MG PO TBEC: 81.0000 mg | DELAYED_RELEASE_TABLET | Freq: Every day | ORAL | Status: DC

## 2015-02-18 NOTE — Progress Notes (Signed)
Cardiology Office Note   Date:  02/18/2015   ID:  Bradley [name], DOB 1971-01-09, MRN [id number]  PCP:  [name], MD  Cardiologist:   [name], MD   Pt referred for evaluation of murmur    History of Present Illness: Bradley [name] is a 44 y.o. male with a history of Murmur  Pt says that he was told it was louder than in past   Breing treaed now for phenumona Prior to this breathing was oK    NO stiory of CP prior No syncope     Patient had gastric bypass about 8 yeas ago        Current Outpatient Prescriptions  Medication Sig Dispense Refill  . acetaminophen (TYLENOL) 500 MG tablet Take 1,000 mg by mouth every 6 (six) hours as needed for mild pain.    Marland Kitchen amoxicillin-clavulanate (AUGMENTIN) 875-125 MG tablet Take 1 tablet by mouth 2 (two) times daily. 20 tablet 0  . HYDROcodone-acetaminophen (NORCO) 10-325 MG tablet Take 1 tablet by mouth every 6 (six) hours as needed.    Marland Kitchen ibuprofen (ADVIL,MOTRIN) 200 MG tablet Take 800 mg by mouth every 6 (six) hours as needed for moderate pain.    Marland Kitchen levofloxacin (LEVAQUIN) 500 MG tablet Take 1 tablet (500 mg total) by mouth daily. For 10 days 10 tablet 0  . mometasone-formoterol (DULERA) 100-5 MCG/ACT AERO Take 2 puffs first thing in am and then another 2 puffs about 12 hours later.    . Multiple Vitamin (MULTIVITAMIN WITH MINERALS) TABS tablet Take 1 tablet by mouth daily.    . pseudoephedrine (SUDAFED) 60 MG tablet Take 1 tablet (60 mg total) by mouth every 4 (four) hours as needed (priapism). 30 tablet 0  . traMADol (ULTRAM) 50 MG tablet Take 1 tablet (50 mg total) by mouth every 4 (four) hours. As needed 40 tablet 0  . chlorpheniramine-HYDROcodone (TUSSIONEX) 10-8 MG/5ML SUER Reported on 02/18/2015  0   No current facility-administered medications for this visit.    Allergies:   Review of patient's allergies indicates no known allergies.   Past Medical History  Diagnosis Date  . Hypertension   . Kidney stone   . Anxiety    . Depression   . Displacement of intervertebral disc, site unspecified, without myelopathy   . Other, mixed, or unspecified nondependent drug abuse, unspecified   . Priapism   . Arthropathy, unspecified, other specified sites   . Lumbago   . Contact with or exposure to venereal diseases   . Personal history of urinary calculi   . Obesity, unspecified     Past Surgical History  Procedure Laterality Date  . Gastric bypass    . Ankle surgery    . Knee surgery    . Hernia repair    . Panectomy    . Incision and drainage abscess Right 07/06/2004    great toe  . Irrigation and debridement abscess Right 07/08/2004    great toe  . Microdiscectomy lumbar Left 08/24/2008    T12 - L1  . Abdominal surgery    . Back surgery       Social History:  The patient  reports that he has never smoked. He has never used smokeless tobacco. He reports that he does not drink alcohol or use illicit drugs.   Family History:  The patient's family history includes Arthritis in his mother; Heart disease in his father; Hypertension in his father; Kidney disease in his father.    ROS:  Please see the history of present illness. All other systems are reviewed and  Negative to the above problem except as noted.    PHYSICAL EXAM: VS:  BP 157/102 mmHg  Pulse 78  Ht 5' 10"  (1.778 m)  Wt 97.578 kg (215 lb 1.9 oz)  BMI 30.87 kg/m2  SpO2 95%  GEN: Well nourished, well developed, in no acute distress HEENT: normal Neck: no JVD, carotid bruits, or masses Cardiac: RRR; Gr II/VI systolic murmur LSB to base  No edema  2+ pulses   Respiratory:  clear to auscultation bilaterally, normal work of breathing GI: soft, nontender, nondistended, + BS  No hepatomegaly  MS: no deformity Moving all extremities   Skin: warm and dry, no rash Neuro:  Strength and sensation are intact Psych: euthymic mood, full affect   EKG:  EKG is not ordered today    Lipid Panel    Component Value Date/Time   CHOL 138 04/08/2014  1045   TRIG 91.0 04/08/2014 1045   HDL 38.80* 04/08/2014 1045   CHOLHDL 4 04/08/2014 1045   VLDL 18.2 04/08/2014 1045   LDLCALC 81 04/08/2014 1045      Wt Readings from Last 3 Encounters:  02/18/15 97.578 kg (215 lb 1.9 oz)  02/16/15 93.895 kg (207 lb)  02/05/15 98.158 kg (216 lb 6.4 oz)      ASSESSMENT AND PLAN: 1    Murmur  Most likely repesent aortic sclerosis  Will get echo  2.  CAD  CT shows calcification in LAD distrib consistent with CAD  Pt asymptomatic   Rec ASA    3.  Lipids  Lipids are very good  LDL 81   Most likely elevated when morbidly obese  Will review  3  Pneuomonia  Recovering        Signed, [name], MD  02/18/2015 10:28 AM    [facility] [place], [place], [place]  [address] Phone: [phone]; Fax: [phone]

## 2015-02-18 NOTE — Patient Instructions (Signed)
Your physician recommends that you continue on your current medications as directed. Please refer to the Current Medication list given to you today. Your physician has requested that you have an echocardiogram. Echocardiography is a painless test that uses sound waves to create images of your heart. It provides your doctor with information about the size and shape of your heart and how well your heart's chambers and valves are working. This procedure takes approximately one hour. There are no restrictions for this procedure.  Your physician recommends that you return for lab work when you come in for the echo.  Your physician wants you to follow-up in: 1 year with Dr. Harrington Challenger.  You will receive a reminder letter in the mail two months in advance. If you don't receive a letter, please call our office to schedule the follow-up appointment.

## 2015-02-26 ENCOUNTER — Ambulatory Visit: Payer: Managed Care, Other (non HMO) | Admitting: Internal Medicine

## 2015-03-04 ENCOUNTER — Other Ambulatory Visit (INDEPENDENT_AMBULATORY_CARE_PROVIDER_SITE_OTHER): Payer: Managed Care, Other (non HMO) | Admitting: *Deleted

## 2015-03-04 ENCOUNTER — Other Ambulatory Visit: Payer: Self-pay

## 2015-03-04 ENCOUNTER — Ambulatory Visit (HOSPITAL_COMMUNITY): Payer: Managed Care, Other (non HMO) | Attending: Cardiovascular Disease

## 2015-03-04 DIAGNOSIS — R011 Cardiac murmur, unspecified: Secondary | ICD-10-CM | POA: Diagnosis not present

## 2015-03-04 LAB — LIPID PANEL
Cholesterol: 123 mg/dL — ABNORMAL LOW (ref 125–200)
HDL: 45 mg/dL (ref >=40)
LDL Cholesterol: 68 mg/dL (ref <130)
Total CHOL/HDL Ratio: 2.7 Ratio (ref <=5.0)
Triglycerides: 48 mg/dL (ref <150)
VLDL: 10 mg/dL (ref <30)

## 2015-03-10 ENCOUNTER — Encounter: Payer: Self-pay | Admitting: Internal Medicine

## 2015-03-18 ENCOUNTER — Other Ambulatory Visit (HOSPITAL_COMMUNITY): Payer: Self-pay | Admitting: Neurosurgery

## 2015-03-19 ENCOUNTER — Telehealth: Payer: Self-pay | Admitting: *Deleted

## 2015-03-19 NOTE — Telephone Encounter (Signed)
Request for cardiac clearance for patient to have surgery received from [facility] Signed by Dr. Harrington Challenger. Placed in nurse fax bin in medical records to be faxed.

## 2015-05-14 DIAGNOSIS — Z0271 Encounter for disability determination: Secondary | ICD-10-CM

## 2015-06-08 ENCOUNTER — Encounter: Payer: Self-pay | Admitting: Internal Medicine

## 2015-06-08 ENCOUNTER — Ambulatory Visit (INDEPENDENT_AMBULATORY_CARE_PROVIDER_SITE_OTHER): Payer: Managed Care, Other (non HMO) | Admitting: Internal Medicine

## 2015-06-08 ENCOUNTER — Telehealth: Payer: Self-pay | Admitting: *Deleted

## 2015-06-08 ENCOUNTER — Other Ambulatory Visit (INDEPENDENT_AMBULATORY_CARE_PROVIDER_SITE_OTHER): Payer: Managed Care, Other (non HMO)

## 2015-06-08 VITALS — BP 124/78 | HR 69 | Temp 98.6°F | Ht 70.0 in | Wt 193.0 lb

## 2015-06-08 DIAGNOSIS — Z9884 Bariatric surgery status: Secondary | ICD-10-CM

## 2015-06-08 DIAGNOSIS — D509 Iron deficiency anemia, unspecified: Secondary | ICD-10-CM | POA: Diagnosis not present

## 2015-06-08 DIAGNOSIS — E559 Vitamin D deficiency, unspecified: Secondary | ICD-10-CM

## 2015-06-08 DIAGNOSIS — Z0000 Encounter for general adult medical examination without abnormal findings: Secondary | ICD-10-CM | POA: Insufficient documentation

## 2015-06-08 DIAGNOSIS — D519 Vitamin B12 deficiency anemia, unspecified: Secondary | ICD-10-CM | POA: Diagnosis not present

## 2015-06-08 DIAGNOSIS — E60 Dietary zinc deficiency: Secondary | ICD-10-CM

## 2015-06-08 DIAGNOSIS — I10 Essential (primary) hypertension: Secondary | ICD-10-CM | POA: Diagnosis not present

## 2015-06-08 LAB — CBC WITH DIFFERENTIAL/PLATELET
Basophils Absolute: 0 10*3/uL (ref 0.0–0.1)
Basophils Relative: 0.2 % (ref 0.0–3.0)
Eosinophils Absolute: 0.1 10*3/uL (ref 0.0–0.7)
Eosinophils Relative: 1.5 % (ref 0.0–5.0)
HCT: 25.2 % — ABNORMAL LOW (ref 39.0–52.0)
Hemoglobin: 7.6 g/dL — CL (ref 13.0–17.0)
Lymphocytes Relative: 25.4 % (ref 12.0–46.0)
Lymphs Abs: 1.1 10*3/uL (ref 0.7–4.0)
MCHC: 30 g/dL (ref 30.0–36.0)
MCV: 57 fl — ABNORMAL LOW (ref 78.0–100.0)
Monocytes Absolute: 0.5 10*3/uL (ref 0.1–1.0)
Monocytes Relative: 11.3 % (ref 3.0–12.0)
Neutro Abs: 2.7 10*3/uL (ref 1.4–7.7)
Neutrophils Relative %: 61.6 % (ref 43.0–77.0)
Platelets: 196 10*3/uL (ref 150.0–400.0)
RBC: 4.43 Mil/uL (ref 4.22–5.81)
RDW: 19.1 % — ABNORMAL HIGH (ref 11.5–15.5)
WBC: 4.4 10*3/uL (ref 4.0–10.5)

## 2015-06-08 LAB — COMPREHENSIVE METABOLIC PANEL
ALT: 14 U/L (ref 0–53)
AST: 18 U/L (ref 0–37)
Albumin: 3.9 g/dL (ref 3.5–5.2)
Alkaline Phosphatase: 54 U/L (ref 39–117)
BUN: 10 mg/dL (ref 6–23)
CO2: 26 mEq/L (ref 19–32)
Calcium: 9 mg/dL (ref 8.4–10.5)
Chloride: 106 mEq/L (ref 96–112)
Creatinine, Ser: 0.72 mg/dL (ref 0.40–1.50)
GFR: 124.53 mL/min (ref >60.00)
Glucose, Bld: 103 mg/dL — ABNORMAL HIGH (ref 70–99)
Potassium: 3.9 mEq/L (ref 3.5–5.1)
Sodium: 138 mEq/L (ref 135–145)
Total Bilirubin: 0.5 mg/dL (ref 0.2–1.2)
Total Protein: 7 g/dL (ref 6.0–8.3)

## 2015-06-08 LAB — IBC PANEL
Iron: 9 ug/dL — ABNORMAL LOW (ref 42–165)
Saturation Ratios: 1.8 % — ABNORMAL LOW (ref 20.0–50.0)
Transferrin: 352 mg/dL (ref 212.0–360.0)

## 2015-06-08 LAB — URINALYSIS, ROUTINE W REFLEX MICROSCOPIC
Bilirubin Urine: NEGATIVE
Hgb urine dipstick: NEGATIVE
Leukocytes, UA: NEGATIVE
Nitrite: NEGATIVE
Specific Gravity, Urine: 1.025 (ref 1.000–1.030)
Urine Glucose: NEGATIVE
Urobilinogen, UA: 1 (ref 0.0–1.0)
pH: 5.5 (ref 5.0–8.0)

## 2015-06-08 LAB — LIPID PANEL
Cholesterol: 121 mg/dL (ref 0–200)
HDL: 45.7 mg/dL (ref >39.00)
LDL Cholesterol: 64 mg/dL (ref 0–99)
NonHDL: 74.92
Total CHOL/HDL Ratio: 3
Triglycerides: 57 mg/dL (ref 0.0–149.0)
VLDL: 11.4 mg/dL (ref 0.0–40.0)

## 2015-06-08 LAB — VITAMIN D 25 HYDROXY (VIT D DEFICIENCY, FRACTURES): VITD: 21.96 ng/mL — ABNORMAL LOW (ref 30.00–100.00)

## 2015-06-08 LAB — FOLATE: Folate: 15.5 ng/mL (ref >5.9)

## 2015-06-08 LAB — FERRITIN: Ferritin: 1.8 ng/mL — ABNORMAL LOW (ref 22.0–322.0)

## 2015-06-08 LAB — VITAMIN B12: Vitamin B-12: 305 pg/mL (ref 211–911)

## 2015-06-08 LAB — TSH: TSH: 0.56 u[IU]/mL (ref 0.35–4.50)

## 2015-06-08 NOTE — Progress Notes (Signed)
Pre visit review using our clinic review tool, if applicable. No additional management support is needed unless otherwise documented below in the visit note. 

## 2015-06-08 NOTE — Patient Instructions (Signed)

## 2015-06-08 NOTE — Telephone Encounter (Signed)
Pt's Hgb is critical at 7.6.

## 2015-06-08 NOTE — Progress Notes (Signed)
Subjective:  Patient ID: FAHIM KATS, male    DOB: 02/23/1971  Age: 45 y.o. MRN: [id number]  CC: Anemia; Hypertension; and Annual Exam   HPI SKANDA WORLDS presents for a CPX and f/up. I last saw about a year ago at which time he was anemic and had multiple vitamin deficiencies. He tells me today that he has not been compliant with his vitamin replacement therapy. He has had intermittent episodes of dyspnea on exertion when climbing stairs over the last few months. He reports a general sensation of fatigue but denies chest pain, diaphoresis, edema, palpitations, or near-syncope. He has chronic low back pain due to degenerative disc disease and needs surgical clearance.  Outpatient Prescriptions Prior to Visit  Medication Sig Dispense Refill  . HYDROcodone-acetaminophen (NORCO) 10-325 MG tablet Take 1 tablet by mouth every 6 (six) hours as needed for moderate pain.     Marland Kitchen amoxicillin-clavulanate (AUGMENTIN) 875-125 MG tablet Take 1 tablet by mouth 2 (two) times daily. 20 tablet 0  . chlorpheniramine-HYDROcodone (TUSSIONEX) 10-8 MG/5ML SUER Reported on 02/18/2015  0  . levofloxacin (LEVAQUIN) 500 MG tablet Take 1 tablet (500 mg total) by mouth daily. For 10 days 10 tablet 0  . pseudoephedrine (SUDAFED) 60 MG tablet Take 1 tablet (60 mg total) by mouth every 4 (four) hours as needed (priapism). 30 tablet 0  . traMADol (ULTRAM) 50 MG tablet Take 1 tablet (50 mg total) by mouth every 4 (four) hours. As needed 40 tablet 0  . acetaminophen (TYLENOL) 500 MG tablet Take 1,000 mg by mouth every 6 (six) hours as needed for mild pain. Reported on 06/08/2015    . aspirin EC 81 MG tablet Take 1 tablet (81 mg total) by mouth daily. (Patient not taking: Reported on 06/08/2015) 90 tablet 3  . ibuprofen (ADVIL,MOTRIN) 200 MG tablet Take 800 mg by mouth every 6 (six) hours as needed for moderate pain. Reported on 06/08/2015    . mometasone-formoterol (DULERA) 100-5 MCG/ACT AERO Take 2 puffs first thing in am and  then another 2 puffs about 12 hours later. (Patient not taking: Reported on 06/08/2015)    . Multiple Vitamin (MULTIVITAMIN WITH MINERALS) TABS tablet Take 1 tablet by mouth daily. Reported on 06/08/2015     No facility-administered medications prior to visit.    ROS Review of Systems  Constitutional: Positive for fatigue. Negative for fever, chills, diaphoresis, appetite change and unexpected weight change.  HENT: Negative.  Negative for congestion, sinus pressure, sore throat and trouble swallowing.   Eyes: Negative.  Negative for visual disturbance.  Respiratory: Positive for shortness of breath. Negative for apnea, cough, choking, chest tightness, wheezing and stridor.   Cardiovascular: Negative.  Negative for chest pain, palpitations and leg swelling.  Gastrointestinal: Negative.  Negative for nausea, vomiting, abdominal pain, diarrhea, constipation and blood in stool.  Endocrine: Negative.   Genitourinary: Negative.  Negative for dysuria, urgency, frequency, hematuria, decreased urine volume and difficulty urinating.  Musculoskeletal: Positive for back pain. Negative for myalgias, joint swelling, arthralgias, neck pain and neck stiffness.  Skin: Negative.  Negative for color change and rash.  Allergic/Immunologic: Negative.   Neurological: Negative.  Negative for dizziness, tremors, seizures, weakness, light-headedness and numbness.  Hematological: Negative.  Negative for adenopathy. Does not bruise/bleed easily.  Psychiatric/Behavioral: Negative.     Objective:  BP 124/78 mmHg  Pulse 69  Temp(Src) 98.6 F (37 C) (Oral)  Ht 5' 10"  (1.778 m)  Wt 193 lb (87.544 kg)  BMI 27.69 kg/m2  SpO2 99%  BP Readings from Last 3 Encounters:  06/09/15 143/87  06/08/15 124/78  02/18/15 157/102    Wt Readings from Last 3 Encounters:  06/09/15 196 lb 11.2 oz (89.223 kg)  06/08/15 193 lb (87.544 kg)  02/18/15 215 lb 1.9 oz (97.578 kg)    Physical Exam  Constitutional: He is oriented to  person, place, and time. He appears well-developed and well-nourished.  Non-toxic appearance. He does not have a sickly appearance. He does not appear ill. No distress.  HENT:  Mouth/Throat: Oropharynx is clear and moist. Mucous membranes are pale, not dry and not cyanotic. No oropharyngeal exudate.  Eyes: Conjunctivae are normal. Right eye exhibits no discharge. Left eye exhibits no discharge. No scleral icterus.  Neck: Normal range of motion. Neck supple. No JVD present. No tracheal deviation present. No thyromegaly present.  Cardiovascular: Normal rate, regular rhythm, normal heart sounds and intact distal pulses.  Exam reveals no gallop and no friction rub.   No murmur heard. EKG ---  Sinus  Rhythm  WITHIN NORMAL LIMITS  Pulmonary/Chest: Effort normal and breath sounds normal. No stridor. No respiratory distress. He has no wheezes. He has no rales. He exhibits no tenderness.  Abdominal: Soft. Bowel sounds are normal. He exhibits no distension and no mass. There is no tenderness. There is no rebound and no guarding.  Musculoskeletal: Normal range of motion. He exhibits no edema or tenderness.  Lymphadenopathy:    He has no cervical adenopathy.  Neurological: He is oriented to person, place, and time.  Skin: Skin is warm and dry. No rash noted. He is not diaphoretic. No erythema. No pallor.  Psychiatric: His behavior is normal. Judgment and thought content normal.  Vitals reviewed.   Lab Results  Component Value Date   WBC 4.4 06/08/2015   HGB 7.6 Repeated and verified X2.* 06/08/2015   HCT 25.2 Repeated and verified X2.* 06/08/2015   PLT 196.0 06/08/2015   GLUCOSE 103* 06/08/2015   CHOL 121 06/08/2015   TRIG 57.0 06/08/2015   HDL 45.70 06/08/2015   LDLCALC 64 06/08/2015   ALT 14 06/08/2015   AST 18 06/08/2015   NA 138 06/08/2015   K 3.9 06/08/2015   CL 106 06/08/2015   CREATININE 0.72 06/08/2015   BUN 10 06/08/2015   CO2 26 06/08/2015   TSH 0.56 06/08/2015   HGBA1C 4.9  10/05/2009    Ct Lumbar Spine W Contrast  02/16/2015  CLINICAL DATA:  Lumbar radiculopathy. Low back pain and left greater than right lower extremity pain. EXAM: LUMBAR MYELOGRAM FLUOROSCOPY TIME:  Fluoroscopy Time (in minutes and seconds): 1 minute 13 seconds Number of Acquired Images:  12 PROCEDURE: After thorough discussion of risks and benefits of the procedure including bleeding, infection, injury to nerves, blood vessels, adjacent structures as well as headache and CSF leak, written and oral informed consent was obtained. Consent was obtained by Dr. Logan Bores. Time out form was completed. Patient was positioned prone on the fluoroscopy table. Local anesthesia was provided with 1% lidocaine without epinephrine after prepped and draped in the usual sterile fashion. Puncture was performed at L3-4 using a 5 inch 22-gauge spinal needle via a right interlaminar approach. Using a single pass through the dura, the needle was placed within the thecal sac, with return of clear CSF. 15 mL of Isovue-M 200 was injected into the thecal sac, with normal opacification of the nerve roots and cauda equina consistent with free flow within the subarachnoid space. I personally performed the lumbar  puncture and administered the intrathecal contrast. I also personally supervised acquisition of the myelogram images. TECHNIQUE: Contiguous axial images were obtained through the Lumbar spine after the intrathecal infusion of contrast. Coronal and sagittal reconstructions were obtained of the axial image sets. COMPARISON:  Lumbar spine MRI 12/02/2014 FINDINGS: LUMBAR MYELOGRAM FINDINGS: There is slight retrolisthesis of L2 on L3 and L3 on L4 without significant change during flexion or extension. Small ventral extradural defects are present at L2-3 and L3-4 without evidence of significant spinal stenosis. There is evidence of left lateral recess narrowing at L2-3. CT LUMBAR MYELOGRAM FINDINGS: Grade 1 retrolisthesis of L2 on L3  and L3 on L4 measure 4 and 3 mm, respectively, unchanged from the prior MRI. There is mild lumbar dextroscoliosis with apex at L3. Vertebral body heights are preserved. Disc space narrowing is moderate at L2-3 and mild at L3-4 and L4-5. An L2 vertebral body hemangioma is again seen. The conus medullaris terminates at L1. Mild aortoiliac atherosclerotic calcification is noted. T12-L1: Mild disc bulging and a left foraminal disc osteophyte complex results in mild left neural foraminal stenosis without spinal stenosis, unchanged. L1-2:  Negative. L2-3: Prior left foraminotomy again noted. Soft tissue in the foramen is better evaluated on prior contrast-enhanced MRI. Disc bulging and facet hypertrophy result in mild left lateral recess stenosis without evidence of neural compression. No spinal canal or right neural foraminal stenosis. L3-4: Disc bulging and mild facet hypertrophy result in mild left greater than right neural foraminal stenosis without spinal stenosis, unchanged. L4-5: Mild disc bulging and mild facet hypertrophy result in mild left greater than right neural foraminal stenosis without spinal stenosis, unchanged. L5-S1:  Negative. IMPRESSION: 1. Prior left foraminotomy at L2-3 with postoperative soft tissue in the foramen better evaluated on prior MRI. Mild left lateral recess stenosis without evidence of neural compression. 2. Mild left greater than right neural foraminal stenosis at L3-4 and L4-5. Electronically Signed   By: Logan Bores M.D.   On: 02/16/2015 16:57   Dg Myelography Lumbar Inj Lumbosacral  02/16/2015  CLINICAL DATA:  Lumbar radiculopathy. Low back pain and left greater than right lower extremity pain. EXAM: LUMBAR MYELOGRAM FLUOROSCOPY TIME:  Fluoroscopy Time (in minutes and seconds): 1 minute 13 seconds Number of Acquired Images:  12 PROCEDURE: After thorough discussion of risks and benefits of the procedure including bleeding, infection, injury to nerves, blood vessels, adjacent  structures as well as headache and CSF leak, written and oral informed consent was obtained. Consent was obtained by Dr. Logan Bores. Time out form was completed. Patient was positioned prone on the fluoroscopy table. Local anesthesia was provided with 1% lidocaine without epinephrine after prepped and draped in the usual sterile fashion. Puncture was performed at L3-4 using a 5 inch 22-gauge spinal needle via a right interlaminar approach. Using a single pass through the dura, the needle was placed within the thecal sac, with return of clear CSF. 15 mL of Isovue-M 200 was injected into the thecal sac, with normal opacification of the nerve roots and cauda equina consistent with free flow within the subarachnoid space. I personally performed the lumbar puncture and administered the intrathecal contrast. I also personally supervised acquisition of the myelogram images. TECHNIQUE: Contiguous axial images were obtained through the Lumbar spine after the intrathecal infusion of contrast. Coronal and sagittal reconstructions were obtained of the axial image sets. COMPARISON:  Lumbar spine MRI 12/02/2014 FINDINGS: LUMBAR MYELOGRAM FINDINGS: There is slight retrolisthesis of L2 on L3 and L3 on L4 without significant  change during flexion or extension. Small ventral extradural defects are present at L2-3 and L3-4 without evidence of significant spinal stenosis. There is evidence of left lateral recess narrowing at L2-3. CT LUMBAR MYELOGRAM FINDINGS: Grade 1 retrolisthesis of L2 on L3 and L3 on L4 measure 4 and 3 mm, respectively, unchanged from the prior MRI. There is mild lumbar dextroscoliosis with apex at L3. Vertebral body heights are preserved. Disc space narrowing is moderate at L2-3 and mild at L3-4 and L4-5. An L2 vertebral body hemangioma is again seen. The conus medullaris terminates at L1. Mild aortoiliac atherosclerotic calcification is noted. T12-L1: Mild disc bulging and a left foraminal disc osteophyte  complex results in mild left neural foraminal stenosis without spinal stenosis, unchanged. L1-2:  Negative. L2-3: Prior left foraminotomy again noted. Soft tissue in the foramen is better evaluated on prior contrast-enhanced MRI. Disc bulging and facet hypertrophy result in mild left lateral recess stenosis without evidence of neural compression. No spinal canal or right neural foraminal stenosis. L3-4: Disc bulging and mild facet hypertrophy result in mild left greater than right neural foraminal stenosis without spinal stenosis, unchanged. L4-5: Mild disc bulging and mild facet hypertrophy result in mild left greater than right neural foraminal stenosis without spinal stenosis, unchanged. L5-S1:  Negative. IMPRESSION: 1. Prior left foraminotomy at L2-3 with postoperative soft tissue in the foramen better evaluated on prior MRI. Mild left lateral recess stenosis without evidence of neural compression. 2. Mild left greater than right neural foraminal stenosis at L3-4 and L4-5. Electronically Signed   By: Logan Bores M.D.   On: 02/16/2015 16:57    Assessment & Plan:   Wake was seen today for anemia, hypertension and annual exam.  Diagnoses and all orders for this visit:  Essential hypertension- his blood pressure is well-controlled on no medications -     EKG 12-Lead  Anemia, iron deficiency- he is severely iron deficient, will restart iron replacement therapy -     IBC panel; Future -     Ferritin; Future  B12 deficiency anemia- he is anemic but his B12 level is in the normal range -     Vitamin B12; Future -     Folate; Future  Gastric bypass status for obesity- he has evidence of post bypass vitamin deficiencies, will screen for B deficiencies and will replace if indicated -     Zinc; Future -     Vitamin B6; Future -     Vitamin B1; Future  Vitamin D deficiency- his vitamin D level is low, he will restart vitamin D replacement therapy  Zinc deficiency- I will recheck his zinc level  and will replace this if indicated -     Zinc; Future  Routine general medical examination at a health care facility- his vaccines were reviewed and updated, exam completed, labs ordered and reviewed, patient education material was given. -     Lipid panel; Future -     TSH; Future -     Urinalysis, Routine w reflex microscopic (not at [facility]); Future -     Comprehensive metabolic panel; Future -     CBC with Differential/Platelet; Future -     EKG 12-Lead  I have discontinued Mr. Salvia pseudoephedrine, levofloxacin, amoxicillin-clavulanate, traMADol, and chlorpheniramine-HYDROcodone. I am also having him start on Cholecalciferol and FERRALET 90. Additionally, I am having him maintain his multivitamin with minerals, ibuprofen, acetaminophen, mometasone-formoterol, HYDROcodone-acetaminophen, and aspirin EC.  Meds ordered this encounter  Medications  . Cholecalciferol 2000 units TABS  Sig: Take 1 tablet (2,000 Units total) by mouth daily.    Dispense:  90 tablet    Refill:  3  . Fe Cbn-Fe Gluc-FA-B12-C-DSS (FERRALET 90) 90-1 MG TABS    Sig: Take 2 tablets by mouth daily.    Dispense:  60 each    Refill:  11     Follow-up: Return in about 4 months (around 10/08/2015).  [name], MD

## 2015-06-09 ENCOUNTER — Encounter: Payer: Self-pay | Admitting: Internal Medicine

## 2015-06-09 ENCOUNTER — Encounter (HOSPITAL_COMMUNITY)
Admission: RE | Admit: 2015-06-09 | Discharge: 2015-06-09 | Disposition: A | Discharge status: Home / Self Care | Payer: Managed Care, Other (non HMO) | Source: Ambulatory Visit | Attending: Neurosurgery | Admitting: Neurosurgery

## 2015-06-09 ENCOUNTER — Encounter (HOSPITAL_COMMUNITY): Payer: Self-pay

## 2015-06-09 DIAGNOSIS — Z01812 Encounter for preprocedural laboratory examination: Secondary | ICD-10-CM | POA: Insufficient documentation

## 2015-06-09 DIAGNOSIS — D649 Anemia, unspecified: Secondary | ICD-10-CM | POA: Diagnosis not present

## 2015-06-09 DIAGNOSIS — Z9884 Bariatric surgery status: Secondary | ICD-10-CM | POA: Diagnosis not present

## 2015-06-09 DIAGNOSIS — Z79899 Other long term (current) drug therapy: Secondary | ICD-10-CM | POA: Diagnosis not present

## 2015-06-09 DIAGNOSIS — M4800 Spinal stenosis, site unspecified: Secondary | ICD-10-CM | POA: Insufficient documentation

## 2015-06-09 DIAGNOSIS — K219 Gastro-esophageal reflux disease without esophagitis: Secondary | ICD-10-CM | POA: Insufficient documentation

## 2015-06-09 DIAGNOSIS — Z0183 Encounter for blood typing: Secondary | ICD-10-CM | POA: Insufficient documentation

## 2015-06-09 HISTORY — DX: Gastro-esophageal reflux disease without esophagitis: K21.9

## 2015-06-09 HISTORY — DX: Cardiac murmur, unspecified: R01.1

## 2015-06-09 HISTORY — DX: Spinal stenosis, lumbar region without neurogenic claudication: M48.061

## 2015-06-09 HISTORY — DX: Pneumonia, unspecified organism: J18.9

## 2015-06-09 HISTORY — DX: Anemia, unspecified: D64.9

## 2015-06-09 HISTORY — DX: Unspecified osteoarthritis, unspecified site: M19.90

## 2015-06-09 LAB — TYPE AND SCREEN
ABO/RH(D): A POS
Antibody Screen: NEGATIVE

## 2015-06-09 LAB — SURGICAL PCR SCREEN
MRSA, PCR: POSITIVE — AB
Staphylococcus aureus: POSITIVE — AB

## 2015-06-09 MED ORDER — CHOLECALCIFEROL 50 MCG (2000 UT) PO TABS: 1.0000 | ORAL_TABLET | Freq: Every day | ORAL | Status: DC

## 2015-06-09 MED ORDER — FERRALET 90 90-1 MG PO TABS: 2.0000 | ORAL_TABLET | Freq: Every day | ORAL | Status: DC

## 2015-06-09 NOTE — Pre-Procedure Instructions (Signed)
Bradley [name]  06/09/2015      [id number] - [place], [address] [place] [address] Phone: [phone] Fax: [phone]   Your procedure is scheduled on Tuesday, June 22, 2015  Report to [facility] Admitting at 6:00 A.M.  Call this number if you have problems the morning of surgery:  [phone]   Remember:  Do not eat food or drink liquids after midnight Monday, June 21, 2015  Take these medicines the morning of surgery with A SIP OF WATER : if needed: acetaminophen (TYLENOL) for mild pain OR HYDROcodone-acetaminophen ([place]) for moderate pain.  Stop taking Aspirin, vitamins, fish oil and herbal medications. Do not take any NSAIDs ie: Ibuprofen, Advil, Naproxen, BC and Goody Powder or any medication containing Aspirin; stop Tuesday, June 15, 2015.  Do not wear jewelry, make-up or nail polish.  Do not wear lotions, powders, or perfumes.  You may not wear deodorant.  Do not shave 48 hours prior to surgery.  Men may shave face and neck.  Do not bring valuables to the hospital.  [facility] is not responsible for any belongings or valuables.  Contacts, dentures or bridgework may not be worn into surgery.  Leave your suitcase in the car.  After surgery it may be brought to your room.  For patients admitted to the hospital, discharge time will be determined by your treatment team.  Patients discharged the day of surgery will not be allowed to drive home.   Name and phone number of your driver:   Special instructions:  [place] - Preparing for Surgery  Before surgery, you can play an important role.  Because skin is not sterile, your skin needs to be as free of germs as possible.  You can reduce the number of germs on you skin by washing with CHG (chlorahexidine gluconate) soap before surgery.  CHG is an antiseptic cleaner which kills germs and bonds with the skin to  continue killing germs even after washing.  Please DO NOT use if you have an allergy to CHG or antibacterial soaps.  If your skin becomes reddened/irritated stop using the CHG and inform your nurse when you arrive at Short Stay.  Do not shave (including legs and underarms) for at least 48 hours prior to the first CHG shower.  You may shave your face.  Please follow these instructions carefully:   1.  Shower with CHG Soap the night before surgery and the morning of Surgery.  2.  If you choose to wash your hair, wash your hair first as usual with your normal shampoo.  3.  After you shampoo, rinse your hair and body thoroughly to remove the Shampoo.  4.  Use CHG as you would any other liquid soap.  You can apply chg directly  to the skin and wash gently with scrungie or a clean washcloth.  5.  Apply the CHG Soap to your body ONLY FROM THE NECK DOWN.  Do not use on open wounds or open sores.  Avoid contact with your eyes, ears, mouth and genitals (private parts).  Wash genitals (private parts) with your normal soap.  6.  Wash thoroughly, paying special attention to the area where your surgery will be performed.  7.  Thoroughly rinse your body with warm water from the neck down.  8.  DO NOT shower/wash with your normal soap after  using and rinsing off the CHG Soap.  9.  Pat yourself dry with a clean towel.            10.  Wear clean pajamas.            11.  Place clean sheets on your bed the night of your first shower and do not sleep with pets.  Day of Surgery  Do not apply any lotions/deodorants the morning of surgery.  Please wear clean clothes to the hospital/surgery center.  Please read over the following fact sheets that you were given. Pain Booklet, Coughing and Deep Breathing, Blood Transfusion Information, MRSA Information and Surgical Site Infection Prevention

## 2015-06-09 NOTE — Progress Notes (Signed)
Pt denies SOB and chest pain but is under the care of Dr. Harrington Challenger, Cardiology. Pt denies having a stress test and cardiac cath. Pt stated that he had a " slight " heart murmur, " [place] said nothing to worry about." Pt stated that he no longer has Hypertension since having gastric bypass and that he has chronic anemia since having gastric bypass; pt denies being symptomatic. Pt stated " the doctor is just going to call in some Iron and Vitamin D for me." Pt chart forwarded to anesthesia to review cardiac clearance note on chart, abnormal labs ( H&H), and chest x ray.

## 2015-06-10 ENCOUNTER — Encounter (HOSPITAL_COMMUNITY): Payer: Self-pay | Admitting: Emergency Medicine

## 2015-06-10 NOTE — Progress Notes (Addendum)
Anesthesia Chart Review:  Pt is a 45 year old male scheduled for anterior lateral lumbar fusion, lumbar percutaneous pedicle screw on 06/22/2015 with Dr. Annette Stable.   PCP is Dr. [name].   PMH includes:  HTN (no longer has since gastric bypass), heart murmur, anemia, hx drug abuse, GERD. Never smoker. BMI 28.   Medications include: iron, dulera.   Preoperative labs reviewed.  H/H 7.6/25.2. This was done at PCP's office on 06/08/15. Pt was started on iron.   Chest x-ray 01/27/15 reviewed. Most of the right lower lobe infiltrate has cleared with only small residual patchy opacity remaining in this area. Lungs elsewhere clear. No new opacity.  EKG 06/08/15: sinus rhythm.   Echo 03/04/15:  - Left ventricle: The cavity size was normal. Systolic function was normal. The estimated ejection fraction was in the range of 55% to 60%. Wall motion was normal; there were no regional wall motion abnormalities. - Left atrium: The atrium was moderately dilated. - Right atrium: The atrium was mildly dilated. - Atrial septum: No defect or patent foramen ovale was identified.  Reviewed case with Dr. Conrad Ririe. Pt's low hgb is a concern and will need to be improved prior to surgery. Notified Vanessa in Dr. Marchelle Folks office.   Willeen Cass, FNP-BC [facility]/Anesthesiology Phone: [phone] 06/11/2015 9:50 AM

## 2015-06-14 ENCOUNTER — Inpatient Hospital Stay (HOSPITAL_COMMUNITY): Admission: RE | Admit: 2015-06-14 | Payer: Managed Care, Other (non HMO) | Source: Ambulatory Visit

## 2015-06-14 LAB — VITAMIN B6: Vitamin B6: 14.6 ng/mL (ref 2.1–21.7)

## 2015-06-15 ENCOUNTER — Other Ambulatory Visit: Payer: Self-pay | Admitting: Internal Medicine

## 2015-06-15 ENCOUNTER — Ambulatory Visit (INDEPENDENT_AMBULATORY_CARE_PROVIDER_SITE_OTHER): Payer: Managed Care, Other (non HMO) | Admitting: Internal Medicine

## 2015-06-15 ENCOUNTER — Encounter: Payer: Self-pay | Admitting: Internal Medicine

## 2015-06-15 VITALS — BP 124/80 | HR 67 | Temp 97.8°F | Resp 16 | Ht 70.5 in | Wt 197.0 lb

## 2015-06-15 DIAGNOSIS — E559 Vitamin D deficiency, unspecified: Secondary | ICD-10-CM | POA: Diagnosis not present

## 2015-06-15 DIAGNOSIS — Z9884 Bariatric surgery status: Secondary | ICD-10-CM | POA: Diagnosis not present

## 2015-06-15 DIAGNOSIS — D509 Iron deficiency anemia, unspecified: Secondary | ICD-10-CM

## 2015-06-15 DIAGNOSIS — E60 Dietary zinc deficiency: Secondary | ICD-10-CM

## 2015-06-15 LAB — ZINC: Zinc: 45 ug/dL — ABNORMAL LOW (ref 60–130)

## 2015-06-15 LAB — VITAMIN B1: Vitamin B1 (Thiamine): 9 nmol/L (ref 8–30)

## 2015-06-15 MED ORDER — CHOLECALCIFEROL 50 MCG (2000 UT) PO TABS: 1.0000 | ORAL_TABLET | Freq: Every day | ORAL | Status: DC

## 2015-06-15 MED ORDER — ZINC GLUCONATE 50 MG PO TABS: 50.0000 mg | ORAL_TABLET | Freq: Every day | ORAL | Status: DC

## 2015-06-15 NOTE — Progress Notes (Signed)
Pre visit review using our clinic review tool, if applicable. No additional management support is needed unless otherwise documented below in the visit note. 

## 2015-06-15 NOTE — Progress Notes (Signed)
Subjective:  Patient ID: Bradley [name], male    DOB: 1970/08/24  Age: 45 y.o. MRN: [id number]  CC: Anemia   HPI Bradley [name] presents for f/up on anemia with iron deficiency and zinc deficiency. He needs to get ready for back surgery so he wants to explore options to quickly raise his iron level. Since I last saw him his energy level has improved slightly.  Outpatient Prescriptions Prior to Visit  Medication Sig Dispense Refill  . acetaminophen (TYLENOL) 500 MG tablet Take 1,000 mg by mouth every 6 (six) hours as needed for mild pain. Reported on 06/08/2015    . Fe Cbn-Fe Gluc-FA-B12-C-DSS (FERRALET 90) 90-1 MG TABS Take 2 tablets by mouth daily. 60 each 11  . HYDROcodone-acetaminophen (NORCO) 10-325 MG tablet Take 1 tablet by mouth every 6 (six) hours as needed for moderate pain.     Marland Kitchen ibuprofen (ADVIL,MOTRIN) 200 MG tablet Take 800 mg by mouth every 6 (six) hours as needed for moderate pain. Reported on 06/08/2015    . mometasone-formoterol (DULERA) 100-5 MCG/ACT AERO Take 2 puffs first thing in am and then another 2 puffs about 12 hours later. (Patient not taking: Reported on 06/08/2015)    . Multiple Vitamin (MULTIVITAMIN WITH MINERALS) TABS tablet Take 1 tablet by mouth daily. Reported on 06/08/2015    . zinc gluconate 50 MG tablet Take 1 tablet (50 mg total) by mouth daily. 90 tablet 3  . aspirin EC 81 MG tablet Take 1 tablet (81 mg total) by mouth daily. (Patient not taking: Reported on 06/08/2015) 90 tablet 3  . Cholecalciferol 2000 units TABS Take 1 tablet (2,000 Units total) by mouth daily. 90 tablet 3   No facility-administered medications prior to visit.    ROS Review of Systems  Constitutional: Positive for fatigue. Negative for fever, chills, diaphoresis and appetite change.  HENT: Negative.   Eyes: Negative.   Respiratory: Positive for shortness of breath. Negative for cough, choking, chest tightness, wheezing and stridor.   Cardiovascular: Negative.  Negative for chest  pain, palpitations and leg swelling.  Gastrointestinal: Negative.  Negative for nausea, vomiting, abdominal pain, diarrhea and constipation.  Endocrine: Negative.   Genitourinary: Negative.  Negative for difficulty urinating.  Musculoskeletal: Positive for back pain.  Skin: Negative.  Negative for color change and rash.  Allergic/Immunologic: Negative.   Neurological: Negative.  Negative for dizziness, tremors, syncope, light-headedness, numbness and headaches.  Hematological: Negative for adenopathy. Does not bruise/bleed easily.  Psychiatric/Behavioral: Negative.     Objective:  BP 124/80 mmHg  Pulse 67  Temp(Src) 97.8 F (36.6 C) (Oral)  Resp 16  Ht 5' 10.5" (1.791 m)  Wt 197 lb (89.359 kg)  BMI 27.86 kg/m2  SpO2 99%  BP Readings from Last 3 Encounters:  06/15/15 124/80  06/09/15 143/87  06/08/15 124/78    Wt Readings from Last 3 Encounters:  06/15/15 197 lb (89.359 kg)  06/09/15 196 lb 11.2 oz (89.223 kg)  06/08/15 193 lb (87.544 kg)    Physical Exam  Constitutional: He is oriented to person, place, and time. No distress.  HENT:  Mouth/Throat: Oropharynx is clear and moist. No oropharyngeal exudate.  Eyes: Conjunctivae are normal. Right eye exhibits no discharge. Left eye exhibits no discharge. No scleral icterus.  Neck: Neck supple. No JVD present. No tracheal deviation present. No thyromegaly present.  Cardiovascular: Normal rate, regular rhythm and intact distal pulses.  Exam reveals no gallop and no friction rub.   Murmur heard.  Systolic murmur is  present with a grade of 1/6  Soft, flow SEM over the LLSB  Pulmonary/Chest: Effort normal and breath sounds normal. No stridor. No respiratory distress. He has no wheezes. He has no rales. He exhibits no tenderness.  Abdominal: Soft. Bowel sounds are normal. He exhibits no distension and no mass. There is no tenderness. There is no rebound and no guarding.  Musculoskeletal: Normal range of motion. He exhibits no edema  or tenderness.  Lymphadenopathy:    He has no cervical adenopathy.  Neurological: He is oriented to person, place, and time.  Skin: Skin is warm and dry. No rash noted. He is not diaphoretic. No erythema. No pallor.  Vitals reviewed.   Lab Results  Component Value Date   WBC 4.4 06/08/2015   HGB 7.6 Repeated and verified X2.* 06/08/2015   HCT 25.2 Repeated and verified X2.* 06/08/2015   PLT 196.0 06/08/2015   GLUCOSE 103* 06/08/2015   CHOL 121 06/08/2015   TRIG 57.0 06/08/2015   HDL 45.70 06/08/2015   LDLCALC 64 06/08/2015   ALT 14 06/08/2015   AST 18 06/08/2015   NA 138 06/08/2015   K 3.9 06/08/2015   CL 106 06/08/2015   CREATININE 0.72 06/08/2015   BUN 10 06/08/2015   CO2 26 06/08/2015   TSH 0.56 06/08/2015   HGBA1C 4.9 10/05/2009    No results found.  Assessment & Plan:   Bradley [name] was seen today for anemia.  Diagnoses and all orders for this visit:  Anemia, iron deficiency- will refer to consider an iron infusion, will start zinc supplements -     Ambulatory referral to Hematology  Gastric bypass status for obesity -     Cholecalciferol 2000 units TABS; Take 1 tablet (2,000 Units total) by mouth daily.  Vitamin D deficiency -     Cholecalciferol 2000 units TABS; Take 1 tablet (2,000 Units total) by mouth daily.   I have discontinued Bradley [name] aspirin EC. I am also having him maintain his multivitamin with minerals, ibuprofen, acetaminophen, mometasone-formoterol, HYDROcodone-acetaminophen, FERRALET 90, zinc gluconate, and Cholecalciferol.  Meds ordered this encounter  Medications  . Cholecalciferol 2000 units TABS    Sig: Take 1 tablet (2,000 Units total) by mouth daily.    Dispense:  90 tablet    Refill:  3     Follow-up: Return in about 3 months (around 09/14/2015).  [name], MD

## 2015-06-15 NOTE — Patient Instructions (Signed)
Iron Deficiency Anemia, Adult Anemia is a condition in which there are less red blood cells or hemoglobin in the blood than normal. Hemoglobin is the part of red blood cells that carries oxygen. Iron deficiency anemia is anemia caused by too little iron. It is the most common type of anemia. It may leave you tired and short of breath. CAUSES   Lack of iron in the diet.  Poor absorption of iron, as seen with intestinal disorders.  Intestinal bleeding.  Heavy periods. SIGNS AND SYMPTOMS  Mild anemia may not be noticeable. Symptoms may include:  Fatigue.  Headache.  Pale skin.  Weakness.  Tiredness.  Shortness of breath.  Dizziness.  Cold hands and feet.  Fast or irregular heartbeat. DIAGNOSIS  Diagnosis requires a thorough evaluation and physical exam by your health care provider. Blood tests are generally used to confirm iron deficiency anemia. Additional tests may be done to find the underlying cause of your anemia. These may include:  Testing for blood in the stool (fecal occult blood test).  A procedure to see inside the colon and rectum (colonoscopy).  A procedure to see inside the esophagus and stomach (endoscopy). TREATMENT  Iron deficiency anemia is treated by correcting the cause of the deficiency. Treatment may involve:  Adding iron-rich foods to your diet.  Taking iron supplements. Pregnant or breastfeeding women need to take extra iron because their normal diet usually does not provide the required amount.  Taking vitamins. Vitamin C improves the absorption of iron. Your health care provider may recommend that you take your iron tablets with a glass of orange juice or vitamin C supplement.  Medicines to make heavy menstrual flow lighter.  Surgery. HOME CARE INSTRUCTIONS   Take iron as directed by your health care provider.  If you cannot tolerate taking iron supplements by mouth, talk to your health care provider about taking them through a vein  (intravenously) or an injection into a muscle.  For the best iron absorption, iron supplements should be taken on an empty stomach. If you cannot tolerate them on an empty stomach, you may need to take them with food.  Do not drink milk or take antacids at the same time as your iron supplements. Milk and antacids may interfere with the absorption of iron.  Iron supplements can cause constipation. Make sure to include fiber in your diet to prevent constipation. A stool softener may also be recommended.  Take vitamins as directed by your health care provider.  Eat a diet rich in iron. Foods high in iron include liver, lean beef, whole-grain bread, eggs, dried fruit, and dark green leafy vegetables. SEEK IMMEDIATE MEDICAL CARE IF:   You faint. If this happens, do not drive. Call your local emergency services (911 in [place] if no other help is available.  You have chest pain.  You feel nauseous or vomit.  You have severe or increased shortness of breath with activity.  You feel weak.  You have a rapid heartbeat.  You have unexplained sweating.  You become light-headed when getting up from a chair or bed. MAKE SURE YOU:   Understand these instructions.  Will watch your condition.  Will get help right away if you are not doing well or get worse.   This information is not intended to replace advice given to you by your health care provider. Make sure you discuss any questions you have with your health care provider.   Document Released: 02/18/2000 Document Revised: 03/13/2014 Document Reviewed: 10/28/2012 Elsevier   Interactive Patient Education 2016 [employer]vier Inc.  

## 2015-06-16 ENCOUNTER — Other Ambulatory Visit: Payer: Self-pay | Admitting: Internal Medicine

## 2015-06-16 DIAGNOSIS — E559 Vitamin D deficiency, unspecified: Secondary | ICD-10-CM

## 2015-06-16 MED ORDER — CHOLECALCIFEROL 1.25 MG (50000 UT) PO TABS: 1.0000 | ORAL_TABLET | ORAL | Status: DC

## 2015-06-22 ENCOUNTER — Inpatient Hospital Stay: Admit: 2015-06-22 | Payer: Managed Care, Other (non HMO) | Admitting: Neurosurgery

## 2015-06-22 ENCOUNTER — Encounter: Payer: Self-pay | Admitting: Oncology

## 2015-06-22 SURGERY — ANTERIOR LATERAL LUMBAR FUSION 1 LEVEL
Anesthesia: General

## 2015-07-03 ENCOUNTER — Encounter: Payer: Self-pay | Admitting: Internal Medicine

## 2015-07-14 ENCOUNTER — Ambulatory Visit (HOSPITAL_BASED_OUTPATIENT_CLINIC_OR_DEPARTMENT_OTHER): Payer: Managed Care, Other (non HMO) | Admitting: Oncology

## 2015-07-14 ENCOUNTER — Telehealth: Payer: Self-pay | Admitting: Oncology

## 2015-07-14 VITALS — BP 158/98 | HR 74 | Temp 98.2°F | Resp 18 | Ht 70.5 in | Wt 189.9 lb

## 2015-07-14 DIAGNOSIS — D509 Iron deficiency anemia, unspecified: Secondary | ICD-10-CM

## 2015-07-14 NOTE — Consult Note (Signed)
Reason for Referral: Iron deficiency anemia.   HPI: 45 year old Bradley [name] with history of obesity and status post gastric bypass operation near 2010. He also has history of anxiety, depression among other comorbid conditions. He reports he has been on vitamin supplements since his bypass operation especially intermittently on iron. Most recently however he had not been taking any iron and I noted to develop severe anemia. On April 4 his hemoglobin was 7.6, his MCV was low at 57 with an RDW of 19.1. Previously in November 2016 his hemoglobin was 8.7. His hemoglobin have been normal back in May 2014 with a hemoglobin of close to 12. His iron studies in February 2016 showed iron level of 11, ferritin of 3.5. He was started on oral iron at this time and have been taking it twice a day. A repeat iron studies in 06/08/2015 showed iron level of 9, saturation of 1.8% with a ferritin of 1.8. He was scheduled to have back operation by Dr. Annette Stable was rescheduled because of his worsening anemia. Clinically he does report symptoms of fatigue, tiredness and occasional dyspnea. He denied any chest pain or difficulty breathing. He denied any hematochezia, melena or hemoptysis. He does adhere to iron supplements and have not really had any side effects associated with it. He denied any nausea or vomiting or dyspepsia. His performance status remains adequate at this time but limited because of his back pain and excessive fatigue.  He does not report any headaches, blurry vision, syncope or seizures. He does not report any fevers or chills or sweats. Does not report any cough, wheezing or hemoptysis. Does not report any nausea, vomiting or abdominal pain. Does not report any frequency urgency or hesitancy. Does not report any skeletal complaints other than his back pain. Remaining review of systems unremarkable.   Past Medical History  Diagnosis Date  . Kidney stone   . Anxiety   . Depression   . Displacement of  intervertebral disc, site unspecified, without myelopathy   . Other, mixed, or unspecified nondependent drug abuse, unspecified   . Priapism   . Arthropathy, unspecified, other specified sites   . Lumbago   . Contact with or exposure to venereal diseases   . Personal history of urinary calculi   . Obesity, unspecified   . Heart murmur     " slight, [place] said nothing to worry about."  . Pneumonia   . GERD (gastroesophageal reflux disease)   . Arthritis   . Stenosis, spinal, lumbar   . Anemia     since gastric bypass  . Hypertension      " no longer have hypertension since gastric bypass "  :  Past Surgical History    Procedure Laterality Date  . Gastric bypass    . Ankle surgery    . Knee surgery    . Hernia repair    . Panectomy    . Incision and drainage abscess Right 07/06/2004    great toe  . Irrigation and debridement abscess Right 07/08/2004    great toe  . Microdiscectomy lumbar Left 08/24/2008    T12 - L1  . Abdominal surgery    . Back surgery    :   Current outpatient prescriptions:  .  Cholecalciferol 50000 units TABS, Take 1 tablet by mouth once a week., Disp: 12 tablet, Rfl: 3 .  Fe Cbn-Fe Gluc-FA-B12-C-DSS (FERRALET 90) 90-1 MG TABS, Take 2 tablets by mouth daily., Disp: 60 each, Rfl: 11 .  HYDROcodone-acetaminophen (NORCO) 10-325 MG  tablet, Take 1 tablet by mouth every 6 (six) hours as needed for moderate pain. , Disp: , Rfl:  .  ibuprofen (ADVIL,MOTRIN) 200 MG tablet, Take 800 mg by mouth every 6 (six) hours as needed for moderate pain. Reported on 06/08/2015, Disp: , Rfl:  .  Multiple Vitamin (MULTIVITAMIN WITH MINERALS) TABS tablet, Take 1 tablet by mouth daily. Reported on 06/08/2015, Disp: , Rfl:  .  zinc gluconate 50 MG tablet, Take 1 tablet (50 mg total) by mouth daily., Disp: 90 tablet, Rfl: 3 .  acetaminophen (TYLENOL) 500 MG tablet, Take 1,000 mg by mouth every 6 (six) hours as needed for mild pain. Reported on 07/14/2015, Disp: , Rfl:  .   mometasone-formoterol (DULERA) 100-5 MCG/ACT AERO, Take 2 puffs first thing in am and then another 2 puffs about 12 hours later. (Patient not taking: Reported on 06/08/2015), Disp: , Rfl: :  Allergies  Allergen Reactions  . Codeine Hives    Hives and itching  . Percocet [Oxycodone-Acetaminophen] Hives    Hives and itching  :  Family History  Problem Relation Age of Onset  . Arthritis Mother   . Heart disease Father   . Hypertension Father   . Kidney disease Father   :  Social History   Social History  . Marital Status: Married    Spouse Name: N/A  . Number of Children: N/A  . Years of Education: N/A   Occupational History  . Not on file.   Social History Main Topics  . Smoking status: Never Smoker   . Smokeless tobacco: Never Used  . Alcohol Use: No  . Drug Use: No  . Sexual Activity: Yes   Other Topics Concern  . Not on file   Social History Narrative  :  Pertinent items are noted in HPI.  Exam: Blood pressure 158/98, pulse 74, temperature 98.2 F (36.8 C), temperature source Oral, resp. rate 18, height 5' 10.5" (1.791 m), weight 189 lb 14.4 oz (86.138 kg), SpO2 100 %. General appearance: alert and cooperative Head: Normocephalic, without obvious abnormality Throat: lips, mucosa, and tongue normal; teeth and gums normal Neck: no adenopathy Back: negative Resp: clear to auscultation bilaterally Chest wall: no tenderness Cardio: regular rate and rhythm, S1, S2 normal, no murmur, click, rub or gallop GI: soft, non-tender; bowel sounds normal; no masses,  no organomegaly Extremities: extremities normal, atraumatic, no cyanosis or edema Pulses: 2+ and symmetric Skin: Skin color, texture, turgor normal. No rashes or lesions Lymph nodes: Cervical, supraclavicular, and axillary nodes normal.  CBC    Component Value Date/Time   WBC 4.4 06/08/2015 1351   WBC 6.3 01/27/2015 1709   RBC 4.43 06/08/2015 1351   RBC 4.54* 01/27/2015 1709   HGB 7.6 Repeated and  verified X2.* 06/08/2015 1351   HGB 8.7* 01/27/2015 1709   HCT 25.2 Repeated and verified X2.* 06/08/2015 1351   HCT 28.8* 01/27/2015 1709   PLT 196.0 06/08/2015 1351   MCV 57.0 Repeated and verified X2.* 06/08/2015 1351   MCV 63.5* 01/27/2015 1709   MCH 19.3* 01/27/2015 1709   MCH 22.2* 06/27/2014 0559   MCHC 30.0 06/08/2015 1351   MCHC 30.3* 01/27/2015 1709   RDW 19.1* 06/08/2015 1351   LYMPHSABS 1.1 06/08/2015 1351   MONOABS 0.5 06/08/2015 1351   EOSABS 0.1 06/08/2015 1351   BASOSABS 0.0 06/08/2015 1351      Assessment and Plan:   45 year old Bradley [name] with the following issues:  1. Iron deficiency anemia that has been documented intermittently  since 2011. He reports he noticed this after his gastric bypass operation and have been on oral iron intermittently since that time. His iron stores have been inadequate since his operation. With ferritin have range as well as 1.8 and as high as 10.4. He is anemic and symptomatic from his anemia.  The etiology of his iron deficiency is undoubtedly related to poor absorption related to gastric bypass operation. Despite aggressive oral replacement he persistently having iron deficiency. Other etiologies include celiac disease or GI bleeding are unlikely.  Management standpoint, I have recommended intravenous iron replacement to be done in the near future which we will correct his iron stores and complications associated with Shirlean Kelly was reviewed today including arthralgias, myalgias and infusion related complications. Anaphylaxis is rare in this particular setting. He is agreeable to proceed in the near future.  I anticipate correction of his ferritin and hemoglobin levels in the next 2-3 weeks unlikely he will have adequate hemoglobin to undergo surgery in June 2017.  2. Vitamin deficiency: These have been monitored by Dr. Ronnald Ramp regularly and appears to have been adequately repleted.  3. Back surgery: He was scheduled to have a laminectomy  in April 2017 which have been rescheduled for the time being. I anticipate he'll have normal hemoglobin by mid-June which will allow him to undergo surgery without any hemoglobin related issues.  4. Follow-up: Will be in July to repeat iron studies and hemoglobin at that time.

## 2015-07-14 NOTE — Progress Notes (Signed)
Please see consult note.  

## 2015-07-14 NOTE — Telephone Encounter (Signed)
Gave pt apt & avs

## 2015-07-22 ENCOUNTER — Ambulatory Visit (HOSPITAL_BASED_OUTPATIENT_CLINIC_OR_DEPARTMENT_OTHER): Payer: Managed Care, Other (non HMO)

## 2015-07-22 VITALS — BP 143/84 | HR 73 | Temp 98.4°F | Resp 18

## 2015-07-22 DIAGNOSIS — D509 Iron deficiency anemia, unspecified: Secondary | ICD-10-CM | POA: Diagnosis not present

## 2015-07-22 MED ORDER — SODIUM CHLORIDE 0.9 % IV SOLN
510.0000 mg | Freq: Once | INTRAVENOUS | Status: AC
  Administered 2015-07-22: 510 mg via INTRAVENOUS
  Filled 2015-07-22: qty 17

## 2015-07-22 MED ORDER — SODIUM CHLORIDE 0.9 % IV SOLN
Freq: Once | INTRAVENOUS | Status: AC
  Administered 2015-07-22: 10:00:00 via INTRAVENOUS

## 2015-07-22 NOTE — Patient Instructions (Signed)
Ferumoxytol injection °What is this medicine? °FERUMOXYTOL is an iron complex. Iron is used to make healthy red blood cells, which carry oxygen and nutrients throughout the body. This medicine is used to treat iron deficiency anemia in people with chronic kidney disease. °This medicine may be used for other purposes; ask your health care provider or pharmacist if you have questions. °What should I tell my health care provider before I take this medicine? °They need to know if you have any of these conditions: °-anemia not caused by low iron levels °-high levels of iron in the blood °-magnetic resonance imaging (MRI) test scheduled °-an unusual or allergic reaction to iron, other medicines, foods, dyes, or preservatives °-pregnant or trying to get pregnant °-breast-feeding °How should I use this medicine? °This medicine is for injection into a vein. It is given by a health care professional in a hospital or clinic setting. °Talk to your pediatrician regarding the use of this medicine in children. Special care may be needed. °Overdosage: If you think you have taken too much of this medicine contact a poison control center or emergency room at once. °NOTE: This medicine is only for you. Do not share this medicine with others. °What if I miss a dose? °It is important not to miss your dose. Call your doctor or health care professional if you are unable to keep an appointment. °What may interact with this medicine? °This medicine may interact with the following medications: °-other iron products °This list may not describe all possible interactions. Give your health care provider a list of all the medicines, herbs, non-prescription drugs, or dietary supplements you use. Also tell them if you smoke, drink alcohol, or use illegal drugs. Some items may interact with your medicine. °What should I watch for while using this medicine? °Visit your doctor or healthcare professional regularly. Tell your doctor or healthcare  professional if your symptoms do not start to get better or if they get worse. You may need blood work done while you are taking this medicine. °You may need to follow a special diet. Talk to your doctor. Foods that contain iron include: whole grains/cereals, dried fruits, beans, or peas, leafy green vegetables, and organ meats (liver, kidney). °What side effects may I notice from receiving this medicine? °Side effects that you should report to your doctor or health care professional as soon as possible: °-allergic reactions like skin rash, itching or hives, swelling of the face, lips, or tongue °-breathing problems °-changes in blood pressure °-feeling faint or lightheaded, falls °-fever or chills °-flushing, sweating, or hot feelings °-swelling of the ankles or feet °Side effects that usually do not require medical attention (Report these to your doctor or health care professional if they continue or are bothersome.): °-diarrhea °-headache °-nausea, vomiting °-stomach pain °This list may not describe all possible side effects. Call your doctor for medical advice about side effects. You may report side effects to FDA at 1-800-FDA-1088. °Where should I keep my medicine? °This drug is given in a hospital or clinic and will not be stored at home. °NOTE: This sheet is a summary. It may not cover all possible information. If you have questions about this medicine, talk to your doctor, pharmacist, or health care provider. °  °© 2016, Elsevier/Gold Standard. (2011-10-06 15:23:36) ° °Anemia, Nonspecific °Anemia is a condition in which the concentration of red blood cells or hemoglobin in the blood is below normal. Hemoglobin is a substance in red blood cells that carries oxygen to the tissues   of the body. Anemia results in not enough oxygen reaching these tissues.  °CAUSES  °Common causes of anemia include:  °· Excessive bleeding. Bleeding may be internal or external. This includes excessive bleeding from periods (in  women) or from the intestine.   °· Poor nutrition.   °· Chronic kidney, thyroid, and liver disease.  °· Bone marrow disorders that decrease red blood cell production. °· Cancer and treatments for cancer. °· HIV, AIDS, and their treatments. °· Spleen problems that increase red blood cell destruction. °· Blood disorders. °· Excess destruction of red blood cells due to infection, medicines, and autoimmune disorders. °SIGNS AND SYMPTOMS  °· Minor weakness.   °· Dizziness.   °· Headache. °· Palpitations.   °· Shortness of breath, especially with exercise.   °· Paleness. °· Cold sensitivity. °· Indigestion. °· Nausea. °· Difficulty sleeping. °· Difficulty concentrating. °Symptoms may occur suddenly or they may develop slowly.  °DIAGNOSIS  °Additional blood tests are often needed. These help your health care provider determine the best treatment. Your health care provider will check your stool for blood and look for other causes of blood loss.  °TREATMENT  °Treatment varies depending on the cause of the anemia. Treatment can include:  °· Supplements of iron, vitamin B12, or folic acid.   °· Hormone medicines.   °· A blood transfusion. This may be needed if blood loss is severe.   °· Hospitalization. This may be needed if there is significant continual blood loss.   °· Dietary changes. °· Spleen removal. °HOME CARE INSTRUCTIONS °Keep all follow-up appointments. It often takes many weeks to correct anemia, and having your health care provider check on your condition and your response to treatment is very important. °SEEK IMMEDIATE MEDICAL CARE IF:  °· You develop extreme weakness, shortness of breath, or chest pain.   °· You become dizzy or have trouble concentrating. °· You develop heavy vaginal bleeding.   °· You develop a rash.   °· You have bloody or black, tarry stools.   °· You faint.   °· You vomit up blood.   °· You vomit repeatedly.   °· You have abdominal pain. °· You have a fever or persistent symptoms for more  than 2-3 days.   °· You have a fever and your symptoms suddenly get worse.   °· You are dehydrated.   °MAKE SURE YOU: °· Understand these instructions. °· Will watch your condition. °· Will get help right away if you are not doing well or get worse. °  °This information is not intended to replace advice given to you by your health care provider. Make sure you discuss any questions you have with your health care provider. °  °Document Released: 03/30/2004 Document Revised: 10/23/2012 Document Reviewed: 08/16/2012 °Elsevier Interactive Patient Education ©2016 [employer]vier Inc. ° ° °

## 2015-07-29 ENCOUNTER — Ambulatory Visit (HOSPITAL_BASED_OUTPATIENT_CLINIC_OR_DEPARTMENT_OTHER): Payer: Managed Care, Other (non HMO)

## 2015-07-29 VITALS — BP 129/89 | HR 58 | Temp 98.1°F | Resp 16

## 2015-07-29 DIAGNOSIS — D509 Iron deficiency anemia, unspecified: Secondary | ICD-10-CM

## 2015-07-29 DIAGNOSIS — K912 Postsurgical malabsorption, not elsewhere classified: Secondary | ICD-10-CM | POA: Diagnosis not present

## 2015-07-29 DIAGNOSIS — D508 Other iron deficiency anemias: Secondary | ICD-10-CM | POA: Diagnosis not present

## 2015-07-29 MED ORDER — SODIUM CHLORIDE 0.9 % IV SOLN
Freq: Once | INTRAVENOUS | Status: AC
  Administered 2015-07-29: 10:00:00 via INTRAVENOUS

## 2015-07-29 MED ORDER — SODIUM CHLORIDE 0.9 % IV SOLN
510.0000 mg | Freq: Once | INTRAVENOUS | Status: AC
  Administered 2015-07-29: 510 mg via INTRAVENOUS
  Filled 2015-07-29: qty 17

## 2015-07-29 NOTE — Patient Instructions (Signed)

## 2015-08-25 ENCOUNTER — Encounter: Payer: Self-pay | Admitting: *Deleted

## 2015-08-25 NOTE — Progress Notes (Signed)
Faxed office notes to bunny @ dr pool's office

## 2015-08-31 ENCOUNTER — Ambulatory Visit (INDEPENDENT_AMBULATORY_CARE_PROVIDER_SITE_OTHER): Payer: Managed Care, Other (non HMO) | Admitting: Internal Medicine

## 2015-08-31 ENCOUNTER — Encounter: Payer: Self-pay | Admitting: Internal Medicine

## 2015-08-31 VITALS — BP 140/80 | HR 68 | Temp 98.2°F | Resp 16 | Ht 70.5 in | Wt 188.0 lb

## 2015-08-31 DIAGNOSIS — M25562 Pain in left knee: Secondary | ICD-10-CM | POA: Insufficient documentation

## 2015-08-31 DIAGNOSIS — L02419 Cutaneous abscess of limb, unspecified: Secondary | ICD-10-CM

## 2015-08-31 DIAGNOSIS — L03119 Cellulitis of unspecified part of limb: Secondary | ICD-10-CM

## 2015-08-31 MED ORDER — RIFAMPIN 300 MG PO CAPS: 300.0000 mg | ORAL_CAPSULE | Freq: Two times a day (BID) | ORAL | Status: AC

## 2015-08-31 MED ORDER — SULFAMETHOXAZOLE-TRIMETHOPRIM 800-160 MG PO TABS: 1.0000 | ORAL_TABLET | Freq: Two times a day (BID) | ORAL | Status: AC

## 2015-08-31 NOTE — Progress Notes (Signed)
Pre visit review using our clinic review tool, if applicable. No additional management support is needed unless otherwise documented below in the visit note. 

## 2015-08-31 NOTE — Progress Notes (Signed)
Subjective:  Patient ID: Bradley [name], male    DOB: 1970-12-19  Age: 45 y.o. MRN: [id number]  CC: Recurrent Skin Infections   HPI Bradley [name] presents for a 3 day history of worsening pain, redness, swelling just above his left knee. He has a positive history of MRSA. He denies any recent trauma or injury. The site has been draining a scant amount of thick pus.  Outpatient Prescriptions Prior to Visit  Medication Sig Dispense Refill  . acetaminophen (TYLENOL) 500 MG tablet Take 1,000 mg by mouth every 6 (six) hours as needed for mild pain. Reported on 07/14/2015    . Cholecalciferol 50000 units TABS Take 1 tablet by mouth once a week. 12 tablet 3  . Fe Cbn-Fe Gluc-FA-B12-C-DSS (FERRALET 90) 90-1 MG TABS Take 2 tablets by mouth daily. 60 each 11  . HYDROcodone-acetaminophen (NORCO) 10-325 MG tablet Take 1 tablet by mouth every 6 (six) hours as needed for moderate pain.     Marland Kitchen ibuprofen (ADVIL,MOTRIN) 200 MG tablet Take 800 mg by mouth every 6 (six) hours as needed for moderate pain. Reported on 06/08/2015    . mometasone-formoterol (DULERA) 100-5 MCG/ACT AERO Take 2 puffs first thing in am and then another 2 puffs about 12 hours later.    . Multiple Vitamin (MULTIVITAMIN WITH MINERALS) TABS tablet Take 1 tablet by mouth daily. Reported on 06/08/2015    . zinc gluconate 50 MG tablet Take 1 tablet (50 mg total) by mouth daily. 90 tablet 3   No facility-administered medications prior to visit.    ROS Review of Systems  Constitutional: Negative.  Negative for fever, chills and fatigue.  HENT: Negative.  Negative for sinus pressure, sore throat and trouble swallowing.   Eyes: Negative.   Respiratory: Negative.  Negative for cough, choking, shortness of breath and stridor.   Cardiovascular: Negative.  Negative for chest pain, palpitations and leg swelling.  Gastrointestinal: Negative.  Negative for abdominal pain.  Endocrine: Negative.   Genitourinary: Negative.   Musculoskeletal:  Negative.   Skin: Positive for color change. Negative for pallor, rash and wound.  Allergic/Immunologic: Negative.   Neurological: Negative.   Hematological: Negative.  Negative for adenopathy. Does not bruise/bleed easily.  Psychiatric/Behavioral: Negative.     Objective:  BP 140/80 mmHg  Pulse 68  Temp(Src) 98.2 F (36.8 C) (Oral)  Ht 5' 10.5" (1.791 m)  Wt 188 lb (85.276 kg)  BMI 26.58 kg/m2  SpO2 98%  BP Readings from Last 3 Encounters:  09/02/15 110/80  08/31/15 140/80  07/29/15 129/89    Wt Readings from Last 3 Encounters:  09/02/15 188 lb (85.276 kg)  08/31/15 188 lb (85.276 kg)  07/14/15 189 lb 14.4 oz (86.138 kg)    Physical Exam  Constitutional:  Non-toxic appearance. He does not have a sickly appearance. He does not appear ill. No distress.  Musculoskeletal:       Legs: The area was cleaned and Betadine, prepped and draped in sterile fashion. Local anesthesia was obtained with 2 mL of 2% lidocaine with epi. Next a 4 mm punch incision was made. A cavity with loculations that were filled with a moderate amount of thick purulent exudate was identified. The loculations were irrigated with H2O2 on a Q-tip. The cavity was packed with iodoform. A dressing was applied. He tolerated it well.    Lab Results  Component Value Date   WBC 4.4 06/08/2015   HGB 7.6 Repeated and verified X2.* 06/08/2015   HCT 25.2 Repeated and  verified X2.* 06/08/2015   PLT 196.0 06/08/2015   GLUCOSE 103* 06/08/2015   CHOL 121 06/08/2015   TRIG 57.0 06/08/2015   HDL 45.70 06/08/2015   LDLCALC 64 06/08/2015   ALT 14 06/08/2015   AST 18 06/08/2015   NA 138 06/08/2015   K 3.9 06/08/2015   CL 106 06/08/2015   CREATININE 0.72 06/08/2015   BUN 10 06/08/2015   CO2 26 06/08/2015   TSH 0.56 06/08/2015   HGBA1C 4.9 10/05/2009   No results found.  Assessment & Plan:   Bradley [name] was seen today for recurrent skin infections.  Diagnoses and all orders for this visit:  Cellulitis and abscess  of leg- I&D has been performed, this looks like MRSA so will start Bactrim and rifampin, a culture has been collected and is pending. He will return in 2 days for packing removal. -     WOUND CULTURE; Future -     sulfamethoxazole-trimethoprim (BACTRIM DS,SEPTRA DS) 800-160 MG tablet; Take 1 tablet by mouth 2 (two) times daily. -     rifampin (RIFADIN) 300 MG capsule; Take 1 capsule (300 mg total) by mouth 2 (two) times daily.  I am having Bradley [name] start on sulfamethoxazole-trimethoprim and rifampin. I am also having him maintain his multivitamin with minerals, ibuprofen, acetaminophen, mometasone-formoterol, HYDROcodone-acetaminophen, FERRALET 90, zinc gluconate, and Cholecalciferol.  Meds ordered this encounter  Medications  . sulfamethoxazole-trimethoprim (BACTRIM DS,SEPTRA DS) 800-160 MG tablet    Sig: Take 1 tablet by mouth 2 (two) times daily.    Dispense:  20 tablet    Refill:  0  . rifampin (RIFADIN) 300 MG capsule    Sig: Take 1 capsule (300 mg total) by mouth 2 (two) times daily.    Dispense:  20 capsule    Refill:  0     Follow-up: Return in about 2 days (around 09/02/2015).  [name], MD

## 2015-08-31 NOTE — Patient Instructions (Signed)

## 2015-09-01 ENCOUNTER — Other Ambulatory Visit: Payer: Managed Care, Other (non HMO)

## 2015-09-01 DIAGNOSIS — L02419 Cutaneous abscess of limb, unspecified: Secondary | ICD-10-CM

## 2015-09-01 DIAGNOSIS — L03119 Cellulitis of unspecified part of limb: Principal | ICD-10-CM

## 2015-09-02 ENCOUNTER — Ambulatory Visit (INDEPENDENT_AMBULATORY_CARE_PROVIDER_SITE_OTHER): Payer: Managed Care, Other (non HMO) | Admitting: Internal Medicine

## 2015-09-02 VITALS — BP 110/80 | HR 74 | Temp 98.7°F | Ht 70.5 in | Wt 188.0 lb

## 2015-09-02 DIAGNOSIS — L03119 Cellulitis of unspecified part of limb: Secondary | ICD-10-CM

## 2015-09-02 DIAGNOSIS — L02419 Cutaneous abscess of limb, unspecified: Secondary | ICD-10-CM

## 2015-09-02 NOTE — Progress Notes (Signed)
Pre visit review using our clinic review tool, if applicable. No additional management support is needed unless otherwise documented below in the visit note. 

## 2015-09-04 LAB — WOUND CULTURE
Gram Stain: NONE SEEN
Gram Stain: NONE SEEN

## 2015-09-05 ENCOUNTER — Encounter: Payer: Self-pay | Admitting: Internal Medicine

## 2015-09-06 ENCOUNTER — Encounter: Payer: Self-pay | Admitting: Internal Medicine

## 2015-09-06 NOTE — Progress Notes (Signed)
Subjective:  Patient ID: Bradley [name], male    DOB: [birth year]  Age: 45 y.o. MRN: [id number]  CC: Wound Check   HPI Bradley [name] presents for a wound check and packing removal s/p I and D of an abscess above his left knee 2 days ago. The area feels better with a significant decrease in pain and swelling and no more discharge. He is tolerating the antibiotics well with no abdominal pain, nausea, vomiting, rash, or shortness of breath. The culture is positive for MRSA.  Outpatient Prescriptions Prior to Visit  Medication Sig Dispense Refill  . acetaminophen (TYLENOL) 500 MG tablet Take 1,000 mg by mouth every 6 (six) hours as needed for mild pain. Reported on 07/14/2015    . Cholecalciferol 50000 units TABS Take 1 tablet by mouth once a week. 12 tablet 3  . Fe Cbn-Fe Gluc-FA-B12-C-DSS (FERRALET 90) 90-1 MG TABS Take 2 tablets by mouth daily. 60 each 11  . HYDROcodone-acetaminophen (NORCO) 10-325 MG tablet Take 1 tablet by mouth every 6 (six) hours as needed for moderate pain.     Marland Kitchen ibuprofen (ADVIL,MOTRIN) 200 MG tablet Take 800 mg by mouth every 6 (six) hours as needed for moderate pain. Reported on 06/08/2015    . mometasone-formoterol (DULERA) 100-5 MCG/ACT AERO Take 2 puffs first thing in am and then another 2 puffs about 12 hours later.    . Multiple Vitamin (MULTIVITAMIN WITH MINERALS) TABS tablet Take 1 tablet by mouth daily. Reported on 06/08/2015    . rifampin (RIFADIN) 300 MG capsule Take 1 capsule (300 mg total) by mouth 2 (two) times daily. 20 capsule 0  . sulfamethoxazole-trimethoprim (BACTRIM DS,SEPTRA DS) 800-160 MG tablet Take 1 tablet by mouth 2 (two) times daily. 20 tablet 0  . zinc gluconate 50 MG tablet Take 1 tablet (50 mg total) by mouth daily. 90 tablet 3   No facility-administered medications prior to visit.    ROS Review of Systems  Constitutional: Negative.  Negative for fever, chills, diaphoresis, activity change, appetite change and fatigue.  HENT: Negative.   Negative for sore throat and trouble swallowing.   Eyes: Negative.   Respiratory: Negative.  Negative for cough, choking, chest tightness, shortness of breath and stridor.   Cardiovascular: Negative.  Negative for chest pain, palpitations and leg swelling.  Gastrointestinal: Negative.  Negative for abdominal pain.  Endocrine: Negative.   Genitourinary: Negative.   Musculoskeletal: Negative.   Skin: Positive for wound. Negative for color change and rash.  Allergic/Immunologic: Negative.   Neurological: Negative.   Hematological: Negative.  Negative for adenopathy. Does not bruise/bleed easily.  Psychiatric/Behavioral: Negative.     Objective:  BP 110/80 mmHg  Pulse 74  Temp(Src) 98.7 F (37.1 C) (Oral)  Ht 5' 10.5" (1.791 m)  Wt 188 lb (85.276 kg)  BMI 26.58 kg/m2  SpO2 98%  BP Readings from Last 3 Encounters:  09/02/15 110/80  08/31/15 140/80  07/29/15 129/89    Wt Readings from Last 3 Encounters:  09/02/15 188 lb (85.276 kg)  08/31/15 188 lb (85.276 kg)  07/14/15 189 lb 14.4 oz (86.138 kg)    Physical Exam  Musculoskeletal:       Legs:   Lab Results  Component Value Date   WBC 4.4 06/08/2015   HGB 7.6 Repeated and verified X2.* 06/08/2015   HCT 25.2 Repeated and verified X2.* 06/08/2015   PLT 196.0 06/08/2015   GLUCOSE 103* 06/08/2015   CHOL 121 06/08/2015   TRIG 57.0 06/08/2015   HDL  45.70 06/08/2015   LDLCALC 64 06/08/2015   ALT 14 06/08/2015   AST 18 06/08/2015   NA 138 06/08/2015   K 3.9 06/08/2015   CL 106 06/08/2015   CREATININE 0.72 06/08/2015   BUN 10 06/08/2015   CO2 26 06/08/2015   TSH 0.56 06/08/2015   HGBA1C 4.9 10/05/2009    No results found.  Assessment & Plan:   Bradley [name] was seen today for wound check.  Diagnoses and all orders for this visit:  Cellulitis and abscess of leg- improvement noted, the abscess has resolved and there is very minimal evidence of cellulitis, culture is positive for MRSA so Avastin to complete the course of  Bactrim and rifampin will continued to provide local wound care and will notify me if he develops any new symptoms.  I am having Bradley [name] maintain his multivitamin with minerals, ibuprofen, acetaminophen, mometasone-formoterol, HYDROcodone-acetaminophen, FERRALET 90, zinc gluconate, Cholecalciferol, sulfamethoxazole-trimethoprim, and rifampin.  No orders of the defined types were placed in this encounter.     Follow-up: No Follow-up on file.  [name], MD

## 2015-09-06 NOTE — Patient Instructions (Signed)

## 2015-09-09 ENCOUNTER — Encounter: Payer: Self-pay | Admitting: Oncology

## 2015-09-10 ENCOUNTER — Encounter: Payer: Self-pay | Admitting: Internal Medicine

## 2015-09-15 ENCOUNTER — Other Ambulatory Visit: Payer: Self-pay | Admitting: Internal Medicine

## 2015-09-15 MED ORDER — SCOPOLAMINE 1 MG/3DAYS TD PT72: 1.0000 | MEDICATED_PATCH | TRANSDERMAL | Status: DC

## 2015-09-16 ENCOUNTER — Other Ambulatory Visit: Payer: Self-pay | Admitting: *Deleted

## 2015-09-16 ENCOUNTER — Telehealth: Payer: Self-pay | Admitting: Oncology

## 2015-09-16 NOTE — Telephone Encounter (Signed)
per pof to sch pt appt-cld  spoke to pt and gave pt time & date of appt-pt req last week of August

## 2015-09-28 ENCOUNTER — Ambulatory Visit: Payer: Managed Care, Other (non HMO) | Admitting: Oncology

## 2015-09-28 ENCOUNTER — Other Ambulatory Visit: Payer: Managed Care, Other (non HMO)

## 2015-09-30 ENCOUNTER — Encounter: Payer: Self-pay | Admitting: Internal Medicine

## 2015-09-30 ENCOUNTER — Other Ambulatory Visit: Payer: Self-pay | Admitting: Internal Medicine

## 2015-09-30 DIAGNOSIS — Z9884 Bariatric surgery status: Secondary | ICD-10-CM

## 2015-09-30 DIAGNOSIS — E559 Vitamin D deficiency, unspecified: Secondary | ICD-10-CM

## 2015-09-30 MED ORDER — CHOLECALCIFEROL 50 MCG (2000 UT) PO TABS: 1.0000 | ORAL_TABLET | Freq: Every day | ORAL | 3 refills | Status: DC

## 2015-10-04 ENCOUNTER — Encounter: Payer: Self-pay | Admitting: Internal Medicine

## 2015-10-04 ENCOUNTER — Ambulatory Visit (INDEPENDENT_AMBULATORY_CARE_PROVIDER_SITE_OTHER): Payer: Managed Care, Other (non HMO) | Admitting: Internal Medicine

## 2015-10-04 ENCOUNTER — Ambulatory Visit (INDEPENDENT_AMBULATORY_CARE_PROVIDER_SITE_OTHER)
Admission: RE | Admit: 2015-10-04 | Discharge: 2015-10-04 | Disposition: A | Discharge status: Home / Self Care | Payer: Managed Care, Other (non HMO) | Source: Ambulatory Visit | Attending: Internal Medicine | Admitting: Internal Medicine

## 2015-10-04 VITALS — BP 138/80 | HR 68 | Temp 97.9°F | Resp 16 | Wt 206.0 lb

## 2015-10-04 DIAGNOSIS — D509 Iron deficiency anemia, unspecified: Secondary | ICD-10-CM | POA: Diagnosis not present

## 2015-10-04 DIAGNOSIS — M79645 Pain in left finger(s): Secondary | ICD-10-CM | POA: Diagnosis not present

## 2015-10-04 DIAGNOSIS — M19049 Primary osteoarthritis, unspecified hand: Secondary | ICD-10-CM | POA: Insufficient documentation

## 2015-10-04 MED ORDER — FERROUS SULFATE 325 (65 FE) MG PO TABS: 325.0000 mg | ORAL_TABLET | Freq: Three times a day (TID) | ORAL | 11 refills | Status: DC

## 2015-10-04 NOTE — Patient Instructions (Signed)

## 2015-10-04 NOTE — Progress Notes (Signed)
Subjective:  Patient ID: Bradley [name], male    DOB: 1970/08/18  Age: 45 y.o. MRN: [id number]  CC: No chief complaint on file.   HPI Bradley [name] presents for concerns about a 1 month hx of non-traumatic pain at the base of his L thumb and hand. He denies redness or swelling. He has OA and takes norco and motrin for pain relief.  He also complains that his insurer will not pay for ferralet so he requests a generic option.  Outpatient Medications Prior to Visit  Medication Sig Dispense Refill  . acetaminophen (TYLENOL) 500 MG tablet Take 1,000 mg by mouth every 6 (six) hours as needed for mild pain. Reported on 07/14/2015    . Cholecalciferol 2000 units TABS Take 1 tablet (2,000 Units total) by mouth daily. 90 tablet 3  . HYDROcodone-acetaminophen (NORCO) 10-325 MG tablet Take 1 tablet by mouth every 6 (six) hours as needed for moderate pain.     Marland Kitchen ibuprofen (ADVIL,MOTRIN) 200 MG tablet Take 800 mg by mouth every 6 (six) hours as needed for moderate pain. Reported on 06/08/2015    . mometasone-formoterol (DULERA) 100-5 MCG/ACT AERO Take 2 puffs first thing in am and then another 2 puffs about 12 hours later.    . Multiple Vitamin (MULTIVITAMIN WITH MINERALS) TABS tablet Take 1 tablet by mouth daily. Reported on 06/08/2015    . zinc gluconate 50 MG tablet Take 1 tablet (50 mg total) by mouth daily. 90 tablet 3  . Fe Cbn-Fe Gluc-FA-B12-C-DSS (FERRALET 90) 90-1 MG TABS Take 2 tablets by mouth daily. 60 each 11  . scopolamine (TRANSDERM-SCOP, 1.5 MG,) 1 MG/3DAYS Place 1 patch (1.5 mg total) onto the skin every 3 (three) days. 4 patch 0   No facility-administered medications prior to visit.     ROS Review of Systems  Constitutional: Negative for diaphoresis and fatigue.  HENT: Negative.   Eyes: Negative.   Respiratory: Negative.  Negative for cough, choking, chest tightness, shortness of breath and stridor.   Cardiovascular: Negative.  Negative for chest pain, palpitations and leg  swelling.  Gastrointestinal: Negative.  Negative for abdominal pain, constipation, diarrhea, nausea and vomiting.  Endocrine: Negative.   Genitourinary: Negative.   Musculoskeletal: Positive for arthralgias and back pain. Negative for neck pain.  Skin: Negative.  Negative for color change and rash.  Allergic/Immunologic: Negative.   Neurological: Negative.  Negative for dizziness, weakness, light-headedness and numbness.  Hematological: Negative.  Negative for adenopathy. Does not bruise/bleed easily.  Psychiatric/Behavioral: Negative.     Objective:  BP 138/80   Pulse 68   Temp 97.9 F (36.6 C) (Oral)   Wt 206 lb (93.4 kg)   SpO2 97%   BMI 29.14 kg/m   BP Readings from Last 3 Encounters:  10/04/15 138/80  09/02/15 110/80  08/31/15 140/80    Wt Readings from Last 3 Encounters:  10/04/15 206 lb (93.4 kg)  09/02/15 188 lb (85.3 kg)  08/31/15 188 lb (85.3 kg)    Physical Exam  Constitutional: No distress.  HENT:  Mouth/Throat: Oropharynx is clear and moist. No oropharyngeal exudate.  Eyes: Conjunctivae are normal. Right eye exhibits no discharge. Left eye exhibits no discharge. No scleral icterus.  Neck: Normal range of motion. Neck supple. No JVD present. No tracheal deviation present. No thyromegaly present.  Cardiovascular: Normal rate, regular rhythm, normal heart sounds and intact distal pulses.  Exam reveals no gallop and no friction rub.   No murmur heard. Pulmonary/Chest: Effort normal and breath  sounds normal. No stridor. No respiratory distress. He has no wheezes. He has no rales. He exhibits no tenderness.  Abdominal: Soft. Bowel sounds are normal. He exhibits no distension and no mass. There is no tenderness. There is no rebound and no guarding.  Musculoskeletal:       Left hand: He exhibits deformity. He exhibits normal range of motion, no tenderness, no bony tenderness, normal capillary refill and no laceration. Normal sensation noted. Normal strength noted.        Hands: Lymphadenopathy:    He has no cervical adenopathy.  Skin: Skin is warm and dry. No rash noted. He is not diaphoretic. No erythema. No pallor.  Vitals reviewed.   Lab Results  Component Value Date   WBC 4.4 06/08/2015   HGB 7.6 Repeated and verified X2. (LL) 06/08/2015   HCT 25.2 Repeated and verified X2. (L) 06/08/2015   PLT 196.0 06/08/2015   GLUCOSE 103 (H) 06/08/2015   CHOL 121 06/08/2015   TRIG 57.0 06/08/2015   HDL 45.70 06/08/2015   LDLCALC 64 06/08/2015   ALT 14 06/08/2015   AST 18 06/08/2015   NA 138 06/08/2015   K 3.9 06/08/2015   CL 106 06/08/2015   CREATININE 0.72 06/08/2015   BUN 10 06/08/2015   CO2 26 06/08/2015   TSH 0.56 06/08/2015   HGBA1C 4.9 10/05/2009    Dg Finger Thumb Left  Result Date: 10/04/2015 CLINICAL DATA:  Lt thumb pain mainly in MCP joint x 1 mo, pt has limited ROM, NKI EXAM: LEFT THUMB 2+V COMPARISON:  None. FINDINGS: No fracture.  No dislocation. There are osteoarthritic changes at the trapezium first metacarpal articulation with mild asymmetric joint space narrowing and small marginal osteophytes. The thumb MCP and IP joints are well maintained. Soft tissues are unremarkable. IMPRESSION: 1. No fracture or acute finding. 2. Mild osteoarthritis at the first carpometacarpal articulation. Electronically Signed   By: Lajean Manes M.D.   On: 10/04/2015 12:42   Assessment & Plan:   Diagnoses and all orders for this visit:  Anemia, iron deficiency- changed to a generic option -     ferrous sulfate 325 (65 FE) MG tablet; Take 1 tablet (325 mg total) by mouth 3 (three) times daily with meals.  Thumb pain, left- exam and films are c/w DJD, will cont current meds and have asked him to rest and ice the area -     DG Finger Thumb Left; Future   I have discontinued Mr. Carliss Quast 90 and scopolamine. I am also having him start on ferrous sulfate. Additionally, I am having him maintain his multivitamin with minerals, ibuprofen,  acetaminophen, mometasone-formoterol, HYDROcodone-acetaminophen, zinc gluconate, and Cholecalciferol.  Meds ordered this encounter  Medications  . ferrous sulfate 325 (65 FE) MG tablet    Sig: Take 1 tablet (325 mg total) by mouth 3 (three) times daily with meals.    Dispense:  90 tablet    Refill:  11     Follow-up: Return if symptoms worsen or fail to improve.  [name], MD

## 2015-10-04 NOTE — Progress Notes (Signed)
Pre visit review using our clinic review tool, if applicable. No additional management support is needed unless otherwise documented below in the visit note. 

## 2015-10-05 ENCOUNTER — Other Ambulatory Visit: Payer: Self-pay | Admitting: Internal Medicine

## 2015-10-05 DIAGNOSIS — M79645 Pain in left finger(s): Secondary | ICD-10-CM

## 2015-10-05 DIAGNOSIS — M170 Bilateral primary osteoarthritis of knee: Secondary | ICD-10-CM

## 2015-10-05 MED ORDER — DICLOFENAC SODIUM 2 % TD SOLN: 1.0000 | Freq: Three times a day (TID) | TRANSDERMAL | 2 refills | Status: DC | PRN

## 2015-10-14 ENCOUNTER — Telehealth: Payer: Self-pay | Admitting: Internal Medicine

## 2015-10-14 NOTE — Telephone Encounter (Signed)
Patient has already gotten iron meds.  Is requesting PA to be processed on hand cream.

## 2015-10-14 NOTE — Telephone Encounter (Signed)
Please process PA on iron med.

## 2015-10-15 NOTE — Telephone Encounter (Signed)
PA initiated via [facility] ref # [id number].  Response will be via fax (up to 120 hrs)

## 2015-10-28 ENCOUNTER — Other Ambulatory Visit: Payer: Self-pay | Admitting: Internal Medicine

## 2015-10-28 DIAGNOSIS — M19032 Primary osteoarthritis, left wrist: Secondary | ICD-10-CM

## 2015-10-28 DIAGNOSIS — M170 Bilateral primary osteoarthritis of knee: Secondary | ICD-10-CM

## 2015-10-28 MED ORDER — DICLOFENAC SODIUM 1 % TD GEL: 2.0000 g | Freq: Four times a day (QID) | TRANSDERMAL | 3 refills | Status: DC

## 2015-10-28 NOTE — Telephone Encounter (Signed)
PA DENIED - pt must first try and fail Diclofenac 1% and Voltaren gel 1%

## 2015-10-28 NOTE — Telephone Encounter (Signed)
changed

## 2015-10-29 ENCOUNTER — Telehealth: Payer: Self-pay | Admitting: Emergency Medicine

## 2015-10-29 NOTE — Telephone Encounter (Signed)
Pt is requesting a letter for the [employer] for his dog to be a service dog for panic attacks and the dogs emotional support. Please advise thanks.

## 2015-11-01 NOTE — Telephone Encounter (Signed)
PCP has signed and pt has been informed of same.

## 2015-11-01 NOTE — Telephone Encounter (Signed)
Letter has been written per verbal okay by PCP.

## 2015-11-03 ENCOUNTER — Other Ambulatory Visit (HOSPITAL_BASED_OUTPATIENT_CLINIC_OR_DEPARTMENT_OTHER): Payer: Managed Care, Other (non HMO)

## 2015-11-03 ENCOUNTER — Telehealth: Payer: Self-pay | Admitting: Oncology

## 2015-11-03 ENCOUNTER — Ambulatory Visit (HOSPITAL_BASED_OUTPATIENT_CLINIC_OR_DEPARTMENT_OTHER): Payer: Managed Care, Other (non HMO) | Admitting: Oncology

## 2015-11-03 VITALS — BP 157/98 | HR 69 | Temp 98.2°F | Resp 18 | Wt 207.5 lb

## 2015-11-03 DIAGNOSIS — D509 Iron deficiency anemia, unspecified: Secondary | ICD-10-CM | POA: Diagnosis not present

## 2015-11-03 LAB — CBC WITH DIFFERENTIAL/PLATELET
BASO%: 0.4 % (ref 0.0–2.0)
Basophils Absolute: 0 10*3/uL (ref 0.0–0.1)
EOS%: 1.3 % (ref 0.0–7.0)
Eosinophils Absolute: 0.1 10*3/uL (ref 0.0–0.5)
HCT: 37.7 % — ABNORMAL LOW (ref 38.4–49.9)
HGB: 11.8 g/dL — ABNORMAL LOW (ref 13.0–17.1)
LYMPH%: 16.5 % (ref 14.0–49.0)
MCH: 25.2 pg — ABNORMAL LOW (ref 27.2–33.4)
MCHC: 31.5 g/dL — ABNORMAL LOW (ref 32.0–36.0)
MCV: 80 fL (ref 79.3–98.0)
MONO#: 1 10*3/uL — ABNORMAL HIGH (ref 0.1–0.9)
MONO%: 10.6 % (ref 0.0–14.0)
NEUT#: 6.6 10*3/uL — ABNORMAL HIGH (ref 1.5–6.5)
NEUT%: 71.2 % (ref 39.0–75.0)
Platelets: 246 10*3/uL (ref 140–400)
RBC: 4.71 10*6/uL (ref 4.20–5.82)
RDW: 17.8 % — ABNORMAL HIGH (ref 11.0–14.6)
WBC: 9.2 10*3/uL (ref 4.0–10.3)
lymph#: 1.5 10*3/uL (ref 0.9–3.3)

## 2015-11-03 LAB — IRON AND TIBC
%SAT: 6 % — ABNORMAL LOW (ref 20–55)
Iron: 25 ug/dL — ABNORMAL LOW (ref 42–163)
TIBC: 385 ug/dL (ref 202–409)
UIBC: 360 ug/dL (ref 117–376)

## 2015-11-03 LAB — FERRITIN: Ferritin: 7 ng/ml — ABNORMAL LOW (ref 22–316)

## 2015-11-03 NOTE — Progress Notes (Signed)
Hematology and Oncology Follow Up Visit  BALDWIN RACICOT [id number] [birth year] 45 y.o. 11/03/2015 9:09 AM [name], MDJones, Arvid Right, MD   Principle Diagnosis: 45 year old with Iron deficiency anemia that has been documented intermittently since 2011. In April 2017 he was found to have hemoglobin of 7.6, iron level of 9 indicating severe iron deficiency. He has no signs of bleeding and this is related to poor iron absorption from previous gastric bypass operation.   Prior Therapy: IV iron for a total of 1000 mg given intravenously in April 2017.  Current therapy: Oral iron replacements on a daily basis. An intermittent IV iron as needed.  Interim History: Mr. Douville presents today for a follow-up visit. Since the last visit, he received IV iron and tolerated it well. He felt much better including improvement in his energy as well as performance status. He is no longer reporting dyspnea on exertion. He continues to attend activities of daily living. He continues to have chronic back pain issues and his operation have been postponed because of his iron deficiency.  He denies any hematochezia, melena or hemoptysis. He denied any blood losses including genitourinary losses via hematuria.  He does not report any headaches, blurry vision, syncope or seizures. He does not report any fevers or chills or sweats. Does not report any cough, wheezing or hemoptysis. Does not report any nausea, vomiting or abdominal pain. Does not report any frequency urgency or hesitancy. Does not report any skeletal complaints other than his back pain. Remaining review of systems unremarkable.   Medications: I have reviewed the patient's current medications.  Current Outpatient Prescriptions  Medication Sig Dispense Refill  . acetaminophen (TYLENOL) 500 MG tablet Take 1,000 mg by mouth every 6 (six) hours as needed for mild pain. Reported on 07/14/2015    . Cholecalciferol 2000 units TABS Take 1 tablet (2,000 Units  total) by mouth daily. 90 tablet 3  . diclofenac sodium (VOLTAREN) 1 % GEL Apply 2 g topically 4 (four) times daily. 100 g 3  . ferrous sulfate 325 (65 FE) MG tablet Take 1 tablet (325 mg total) by mouth 3 (three) times daily with meals. 90 tablet 11  . HYDROcodone-acetaminophen (NORCO) 10-325 MG tablet Take 1 tablet by mouth every 6 (six) hours as needed for moderate pain.     Marland Kitchen ibuprofen (ADVIL,MOTRIN) 200 MG tablet Take 800 mg by mouth every 6 (six) hours as needed for moderate pain. Reported on 06/08/2015    . mometasone-formoterol (DULERA) 100-5 MCG/ACT AERO Take 2 puffs first thing in am and then another 2 puffs about 12 hours later.    . Multiple Vitamin (MULTIVITAMIN WITH MINERALS) TABS tablet Take 1 tablet by mouth daily. Reported on 06/08/2015    . zinc gluconate 50 MG tablet Take 1 tablet (50 mg total) by mouth daily. 90 tablet 3   No current facility-administered medications for this visit.      Allergies:  Allergies  Allergen Reactions  . Codeine Hives    Hives and itching  . Percocet [Oxycodone-Acetaminophen] Hives    Hives and itching    Past Medical History, Surgical history, Social history, and Family History were reviewed and updated.   Physical Exam: Blood pressure (!) 157/98, pulse 69, temperature 98.2 F (36.8 C), temperature source Oral, resp. rate 18, weight 207 lb 8 oz (94.1 kg), SpO2 100 %. ECOG: 0 General appearance: alert and cooperative Head: Normocephalic, without obvious abnormality Neck: no adenopathy Lymph nodes: Cervical, supraclavicular, and axillary nodes normal. Heart:regular  rate and rhythm, S1, S2 normal, no murmur, click, rub or gallop Lung:chest clear, no wheezing, rales, normal symmetric air entry Abdomin: soft, non-tender, without masses or organomegaly EXT:no erythema, induration, or nodules   Lab Results: Lab Results  Component Value Date   WBC 9.2 11/03/2015   HGB 11.8 (L) 11/03/2015   HCT 37.7 (L) 11/03/2015   MCV 80.0 11/03/2015    PLT 246 11/03/2015     Chemistry      Component Value Date/Time   NA 138 06/08/2015 1351   K 3.9 06/08/2015 1351   CL 106 06/08/2015 1351   CO2 26 06/08/2015 1351   BUN 10 06/08/2015 1351   CREATININE 0.72 06/08/2015 1351   CREATININE 0.69 09/29/2014 1436      Component Value Date/Time   CALCIUM 9.0 06/08/2015 1351   CALCIUM 8.7 10/05/2009 2249   ALKPHOS 54 06/08/2015 1351   AST 18 06/08/2015 1351   ALT 14 06/08/2015 1351   BILITOT 0.5 06/08/2015 1351      Impression and Plan:  45 year old gentleman with the following issues:  1. Iron deficiency anemia that has been documented intermittently since 2011. He reports he noticed this after his gastric bypass operation and have been on oral iron intermittently since that time. His iron stores have been inadequate since his operation. With ferritin have range as well as 1.8 and as high as 10.4.   He is status post IV iron in April 2017 with his hemoglobin improving but not quite normalizing. He reported no complications related to this infusion and significant improvement in his symptoms. His iron studies are currently pending from today but likely not completely restored.  The plan is to retreat him with intravenous iron for a total 1 g which will take him likely to 100% restoration of his iron stores. At that time we will monitor him intermittently and retreat him with intravenous iron as needed. I recommended continued oral iron for the time being for maintenance purposes.  2. Future back surgery: His hemoglobin is adequate to proceed at any point moving forward as his iron stores are adequately repleted at this time.  3. Follow-up: Will be in 4 months.   [facility], MD 8/30/20179:09 AM

## 2015-11-03 NOTE — Telephone Encounter (Signed)
GAVE PATIENT AVS REPORT AND APPOINTMENTS FOR September THRU December. IV IRON SCHEDULED FOR 9/25 AND 10/2 PER PATIENT REQUEST - OK PER SR SHADAD.

## 2015-11-26 MED FILL — AMOXICILLIN 500 MG CAPSULE: 500 | 7 days supply | Qty: 21 | Fill #0

## 2015-11-29 ENCOUNTER — Ambulatory Visit (HOSPITAL_BASED_OUTPATIENT_CLINIC_OR_DEPARTMENT_OTHER): Payer: Managed Care, Other (non HMO)

## 2015-11-29 VITALS — BP 161/102 | HR 74 | Temp 98.4°F | Resp 17

## 2015-11-29 DIAGNOSIS — D509 Iron deficiency anemia, unspecified: Secondary | ICD-10-CM | POA: Diagnosis not present

## 2015-11-29 MED ORDER — SODIUM CHLORIDE 0.9 % IV SOLN
510.0000 mg | Freq: Once | INTRAVENOUS | Status: AC
  Administered 2015-11-29: 510 mg via INTRAVENOUS
  Filled 2015-11-29: qty 17

## 2015-11-29 MED ORDER — SODIUM CHLORIDE 0.9 % IV SOLN
Freq: Once | INTRAVENOUS | Status: AC
  Administered 2015-11-29: 09:00:00 via INTRAVENOUS

## 2015-11-29 NOTE — Patient Instructions (Signed)

## 2015-12-06 ENCOUNTER — Ambulatory Visit (HOSPITAL_BASED_OUTPATIENT_CLINIC_OR_DEPARTMENT_OTHER): Payer: Managed Care, Other (non HMO)

## 2015-12-06 VITALS — BP 138/94 | HR 72 | Temp 97.8°F | Resp 16

## 2015-12-06 DIAGNOSIS — D509 Iron deficiency anemia, unspecified: Secondary | ICD-10-CM | POA: Diagnosis not present

## 2015-12-06 MED ORDER — SODIUM CHLORIDE 0.9 % IV SOLN
510.0000 mg | Freq: Once | INTRAVENOUS | Status: AC
  Administered 2015-12-06: 510 mg via INTRAVENOUS
  Filled 2015-12-06: qty 17

## 2015-12-06 MED ORDER — SODIUM CHLORIDE 0.9 % IV SOLN
Freq: Once | INTRAVENOUS | Status: AC
  Administered 2015-12-06: 09:00:00 via INTRAVENOUS

## 2015-12-06 NOTE — Patient Instructions (Signed)

## 2015-12-15 ENCOUNTER — Encounter: Payer: Self-pay | Admitting: Internal Medicine

## 2015-12-15 ENCOUNTER — Other Ambulatory Visit: Payer: Self-pay | Admitting: Internal Medicine

## 2015-12-15 DIAGNOSIS — M19032 Primary osteoarthritis, left wrist: Secondary | ICD-10-CM

## 2015-12-30 ENCOUNTER — Other Ambulatory Visit: Payer: Self-pay | Admitting: Neurosurgery

## 2016-02-10 ENCOUNTER — Other Ambulatory Visit: Payer: Managed Care, Other (non HMO)

## 2016-02-10 ENCOUNTER — Ambulatory Visit: Payer: Managed Care, Other (non HMO)

## 2016-02-10 ENCOUNTER — Ambulatory Visit (INDEPENDENT_AMBULATORY_CARE_PROVIDER_SITE_OTHER)
Admission: RE | Admit: 2016-02-10 | Discharge: 2016-02-10 | Disposition: A | Discharge status: Home / Self Care | Payer: Managed Care, Other (non HMO) | Source: Ambulatory Visit | Attending: Nurse Practitioner | Admitting: Nurse Practitioner

## 2016-02-10 ENCOUNTER — Encounter: Payer: Self-pay | Admitting: Nurse Practitioner

## 2016-02-10 ENCOUNTER — Ambulatory Visit (INDEPENDENT_AMBULATORY_CARE_PROVIDER_SITE_OTHER): Payer: Managed Care, Other (non HMO) | Admitting: Nurse Practitioner

## 2016-02-10 VITALS — BP 132/90 | HR 76 | Temp 98.4°F | Ht 70.0 in | Wt 220.0 lb

## 2016-02-10 DIAGNOSIS — R310 Gross hematuria: Secondary | ICD-10-CM

## 2016-02-10 DIAGNOSIS — R509 Fever, unspecified: Secondary | ICD-10-CM | POA: Diagnosis not present

## 2016-02-10 LAB — CBC WITH DIFFERENTIAL/PLATELET
Basophils Absolute: 0.1 10*3/uL (ref 0.0–0.1)
Basophils Relative: 0.8 % (ref 0.0–3.0)
Eosinophils Absolute: 0.1 10*3/uL (ref 0.0–0.7)
Eosinophils Relative: 0.7 % (ref 0.0–5.0)
HCT: 43.4 % (ref 39.0–52.0)
Hemoglobin: 14.5 g/dL (ref 13.0–17.0)
Lymphocytes Relative: 65.2 % — ABNORMAL HIGH (ref 12.0–46.0)
Lymphs Abs: 5.6 10*3/uL — ABNORMAL HIGH (ref 0.7–4.0)
MCHC: 33.4 g/dL (ref 30.0–36.0)
MCV: 79.9 fl (ref 78.0–100.0)
Monocytes Absolute: 0.7 10*3/uL (ref 0.1–1.0)
Monocytes Relative: 7.6 % (ref 3.0–12.0)
Neutro Abs: 2.2 10*3/uL (ref 1.4–7.7)
Neutrophils Relative %: 25.7 % — ABNORMAL LOW (ref 43.0–77.0)
Platelets: 198 10*3/uL (ref 150.0–400.0)
RBC: 5.43 Mil/uL (ref 4.22–5.81)
RDW: 19.2 % — ABNORMAL HIGH (ref 11.5–15.5)
WBC: 8.6 10*3/uL (ref 4.0–10.5)

## 2016-02-10 LAB — POCT URINALYSIS DIPSTICK
Bilirubin, UA: 1
Blood, UA: 3
Glucose, UA: NEGATIVE
Ketones, UA: NEGATIVE
Leukocytes, UA: NEGATIVE
Nitrite, UA: POSITIVE
Spec Grav, UA: >=1.03
Urobilinogen, UA: 1
pH, UA: 6

## 2016-02-10 LAB — COMPREHENSIVE METABOLIC PANEL
ALT: 47 U/L (ref 0–53)
AST: 46 U/L — ABNORMAL HIGH (ref 0–37)
Albumin: 3.5 g/dL (ref 3.5–5.2)
Alkaline Phosphatase: 82 U/L (ref 39–117)
BUN: 10 mg/dL (ref 6–23)
CO2: 28 mEq/L (ref 19–32)
Calcium: 8.7 mg/dL (ref 8.4–10.5)
Chloride: 105 mEq/L (ref 96–112)
Creatinine, Ser: 0.82 mg/dL (ref 0.40–1.50)
GFR: 106.86 mL/min (ref >60.00)
Glucose, Bld: 99 mg/dL (ref 70–99)
Potassium: 4.2 mEq/L (ref 3.5–5.1)
Sodium: 138 mEq/L (ref 135–145)
Total Bilirubin: 0.4 mg/dL (ref 0.2–1.2)
Total Protein: 6.7 g/dL (ref 6.0–8.3)

## 2016-02-10 MED ORDER — CIPROFLOXACIN HCL 500 MG PO TABS: 500.0000 mg | ORAL_TABLET | Freq: Two times a day (BID) | ORAL | 0 refills | Status: DC

## 2016-02-10 NOTE — Progress Notes (Signed)
Pre visit review using our clinic review tool, if applicable. No additional management support is needed unless otherwise documented below in the visit note. 

## 2016-02-10 NOTE — Progress Notes (Signed)
Subjective:  Patient ID: Bradley [name], male    DOB: Apr 09, 1970  Age: 45 y.o. MRN: [id number]  CC: Fever (fever at night,bodyache for 3 days. took tylanol.)   Fever   This is a new problem. Episode onset: 3days. The problem has been waxing and waning. The maximum temperature noted was 101 to 101.9 F. The temperature was taken using an oral thermometer. Associated symptoms include muscle aches. Pertinent negatives include no abdominal pain, chest pain, congestion, coughing, headaches, nausea, rash, sleepiness, sore throat, urinary pain, vomiting or wheezing. He has tried acetaminophen and NSAIDs for the symptoms. The treatment provided mild relief.  Risk factors: no contaminated food, no contaminated water, no hx of cancer, no immunosuppression, no occupational exposure, no recent sickness, no recent travel and no sick contacts   Risk factors comment:  Last travel 61month ago sexually active with wife only. Declined any STD testing.  Outpatient Medications Prior to Visit  Medication Sig Dispense Refill  . acetaminophen (TYLENOL) 500 MG tablet Take 1,000 mg by mouth every 6 (six) hours as needed for mild pain. Reported on 07/14/2015    . Cholecalciferol 2000 units TABS Take 1 tablet (2,000 Units total) by mouth daily. 90 tablet 3  . diclofenac sodium (VOLTAREN) 1 % GEL Apply 2 g topically 4 (four) times daily. 100 g 3  . ferrous sulfate 325 (65 FE) MG tablet Take 1 tablet (325 mg total) by mouth 3 (three) times daily with meals. 90 tablet 11  . HYDROcodone-acetaminophen (NORCO) 10-325 MG tablet Take 1 tablet by mouth every 6 (six) hours as needed for moderate pain.     .Marland Kitchenibuprofen (ADVIL,MOTRIN) 200 MG tablet Take 800 mg by mouth every 6 (six) hours as needed for moderate pain. Reported on 06/08/2015    . Multiple Vitamin (MULTIVITAMIN WITH MINERALS) TABS tablet Take 1 tablet by mouth daily. Reported on 06/08/2015    . zinc gluconate 50 MG tablet Take 1 tablet (50 mg total) by mouth daily. 90  tablet 3  . mometasone-formoterol (DULERA) 100-5 MCG/ACT AERO Take 2 puffs first thing in am and then another 2 puffs about 12 hours later. (Patient not taking: Reported on 02/10/2016)     No facility-administered medications prior to visit.     ROS See HPI  Objective:  BP 132/90   Pulse 76   Temp 98.4 F (36.9 C) (Oral)   Ht _0  (1.778 m)   Wt 220 lb (99.8 kg)   SpO2 99%   BMI 31.57 kg/m   BP Readings from Last 3 Encounters:  02/10/16 132/90  12/06/15 (!) 138/94  11/29/15 (!) 161/102    Wt Readings from Last 3 Encounters:  02/10/16 220 lb (99.8 kg)  11/03/15 207 lb 8 oz (94.1 kg)  10/04/15 206 lb (93.4 kg)    Physical Exam  Constitutional: No distress.  HENT:  Mouth/Throat: No oropharyngeal exudate.  Neck: Normal range of motion. Neck supple.  Cardiovascular: Normal rate, regular rhythm and normal heart sounds.   Pulmonary/Chest: Effort normal and breath sounds normal.  Abdominal: Soft. Bowel sounds are normal.  Musculoskeletal: He exhibits no edema.  Neurological: He is alert.  Skin: Skin is warm and dry.  Vitals reviewed.   Lab Results  Component Value Date   WBC 8.6 02/10/2016   HGB 14.5 02/10/2016   HCT 43.4 02/10/2016   PLT 198.0 02/10/2016   GLUCOSE 99 02/10/2016   CHOL 121 06/08/2015   TRIG 57.0 06/08/2015   HDL 45.70 06/08/2015  LDLCALC 64 06/08/2015   ALT 47 02/10/2016   AST 46 (H) 02/10/2016   NA 138 02/10/2016   K 4.2 02/10/2016   CL 105 02/10/2016   CREATININE 0.82 02/10/2016   BUN 10 02/10/2016   CO2 28 02/10/2016   TSH 0.56 06/08/2015   HGBA1C 4.9 10/05/2009    Dg Finger Thumb Left  Result Date: 10/04/2015 CLINICAL DATA:  Lt thumb pain mainly in MCP joint x 1 mo, pt has limited ROM, NKI EXAM: LEFT THUMB 2+V COMPARISON:  None. FINDINGS: No fracture.  No dislocation. There are osteoarthritic changes at the trapezium first metacarpal articulation with mild asymmetric joint space narrowing and small marginal osteophytes. The thumb  MCP and IP joints are well maintained. Soft tissues are unremarkable. IMPRESSION: 1. No fracture or acute finding. 2. Mild osteoarthritis at the first carpometacarpal articulation. Electronically Signed   By: Lajean Manes M.D.   On: 10/04/2015 12:42   Assessment & Plan:   Taesean was seen today for fever.  Diagnoses and all orders for this visit:  Fever and chills -     POCT urinalysis dipstick -     CBC w/Diff; Future -     Comp Met (CMET); Future -     Cancel: DG Chest 2 View; Future -     DG Abd 1 View; Future -     Urine culture; Future -     ciprofloxacin (CIPRO) 500 MG tablet; Take 1 tablet (500 mg total) by mouth 2 (two) times daily.  Gross hematuria -     DG Abd 1 View; Future -     Urine culture; Future -     ciprofloxacin (CIPRO) 500 MG tablet; Take 1 tablet (500 mg total) by mouth 2 (two) times daily.   I am having Mr. Anello start on ciprofloxacin. I am also having him maintain his multivitamin with minerals, ibuprofen, acetaminophen, mometasone-formoterol, HYDROcodone-acetaminophen, zinc gluconate, Cholecalciferol, ferrous sulfate, and diclofenac sodium.  Meds ordered this encounter  Medications  . ciprofloxacin (CIPRO) 500 MG tablet    Sig: Take 1 tablet (500 mg total) by mouth 2 (two) times daily.    Dispense:  20 tablet    Refill:  0    Order Specific Question:   Supervising Provider    Answer:   Cassandria Anger [[id number]]    Follow-up: No Follow-up on file.  Wilfred Lacy, NP

## 2016-02-10 NOTE — Patient Instructions (Addendum)
Normal KUB With abnormal urinalysis, symptoms might be related to prostatitis. Patient declined HIV and urine STD testing at this time.  Encourage adequate oral hydration  go to basement for labs and x-ray We will call with results.

## 2016-02-10 NOTE — Progress Notes (Signed)
Reviewed with patient in office. See office note

## 2016-02-11 LAB — PATHOLOGIST SMEAR REVIEW

## 2016-02-12 LAB — URINE CULTURE

## 2016-02-14 ENCOUNTER — Other Ambulatory Visit: Payer: Self-pay

## 2016-02-14 MED ORDER — NITROFURANTOIN MONOHYD MACRO 100 MG PO CAPS: 100.0000 mg | ORAL_CAPSULE | Freq: Two times a day (BID) | ORAL | 0 refills | Status: DC

## 2016-02-14 NOTE — Progress Notes (Unsigned)
mac 

## 2016-02-18 ENCOUNTER — Ambulatory Visit: Payer: Managed Care, Other (non HMO) | Admitting: Internal Medicine

## 2016-02-18 ENCOUNTER — Encounter (HOSPITAL_COMMUNITY): Payer: Self-pay

## 2016-02-18 NOTE — Pre-Procedure Instructions (Signed)
Bradley [name]  02/18/2016      [name] [name], [name] Phone: [phone] Fax: [phone]    Your procedure is scheduled on Thursday December 21.  Report to [name] at 9:30 A.M.  Call this number if you have problems the morning of surgery:  [phone]   Remember:  Do not eat food or drink liquids after midnight.  Take these medicines the morning of surgery with A SIP OF WATER: acetaminophen (tylenol) or hydrocodone (norco) if needed, ciprofloxacin (cipro), dulera inhaler  7 days prior to surgery STOP taking any Aspirin, Aleve, Naproxen, Ibuprofen, Motrin, Advil, Goody's, BC's, all herbal medications, fish oil, and all vitamins    Do not wear jewelry, make-up or nail polish.  Do not wear lotions, powders, or perfumes, or deoderant.  Do not shave 48 hours prior to surgery.  Men may shave face and neck.  Do not bring valuables to the hospital.  [name] is not responsible for any belongings or valuables.  Contacts, dentures or bridgework may not be worn into surgery.  Leave your suitcase in the car.  After surgery it may be brought to your room.  For patients admitted to the hospital, discharge time will be determined by your treatment team.  Patients discharged the day of surgery will not be allowed to drive home.    Special instructions:    [name]  Before surgery, you can play an important role. Because skin is not sterile, your skin needs to be as free of germs as possible. You can reduce the number of germs on your skin by washing with CHG (chlorahexidine gluconate) Soap before surgery.  CHG is an antiseptic cleaner which kills germs and bonds with the skin to continue killing germs even after washing.  Please do not use if you have an allergy to CHG or antibacterial soaps. If your  skin becomes reddened/irritated stop using the CHG.  Do not shave (including legs and underarms) for at least 48 hours prior to first CHG shower. It is OK to shave your face.  Please follow these instructions carefully.   1. Shower the NIGHT BEFORE SURGERY and the MORNING OF SURGERY with CHG.   2. If you chose to wash your hair, wash your hair first as usual with your normal shampoo.  3. After you shampoo, rinse your hair and body thoroughly to remove the shampoo.  4. Use CHG as you would any other liquid soap. You can apply CHG directly to the skin and wash gently with a scrungie or a clean washcloth.   5. Apply the CHG Soap to your body ONLY FROM THE NECK DOWN.  Do not use on open wounds or open sores. Avoid contact with your eyes, ears, mouth and genitals (private parts). Wash genitals (private parts) with your normal soap.  6. Wash thoroughly, paying special attention to the area where your surgery will be performed.  7. Thoroughly rinse your body with warm water from the neck down.  8. DO NOT shower/wash with your normal soap after using and rinsing off the CHG Soap.  9. Pat yourself dry with a CLEAN TOWEL.   10. Wear CLEAN PAJAMAS   11. Place CLEAN SHEETS on your bed the night of your first shower and DO NOT SLEEP WITH PETS.    Day  of Surgery: Do not apply any deodorants/lotions. Please wear clean clothes to the hospital/surgery center.      Please read over the following fact sheets that you were given. MRSA Information

## 2016-02-21 ENCOUNTER — Ambulatory Visit (INDEPENDENT_AMBULATORY_CARE_PROVIDER_SITE_OTHER): Payer: Managed Care, Other (non HMO) | Admitting: Physician Assistant

## 2016-02-21 ENCOUNTER — Encounter (HOSPITAL_COMMUNITY): Payer: Self-pay

## 2016-02-21 ENCOUNTER — Encounter: Payer: Self-pay | Admitting: Physician Assistant

## 2016-02-21 ENCOUNTER — Encounter (HOSPITAL_COMMUNITY)
Admission: RE | Admit: 2016-02-21 | Discharge: 2016-02-21 | Disposition: A | Discharge status: Home / Self Care | Payer: Managed Care, Other (non HMO) | Source: Ambulatory Visit | Attending: Neurosurgery | Admitting: Neurosurgery

## 2016-02-21 VITALS — BP 130/80 | HR 68 | Ht 70.0 in | Wt 225.0 lb

## 2016-02-21 DIAGNOSIS — Z01818 Encounter for other preprocedural examination: Secondary | ICD-10-CM | POA: Insufficient documentation

## 2016-02-21 DIAGNOSIS — I2510 Atherosclerotic heart disease of native coronary artery without angina pectoris: Secondary | ICD-10-CM

## 2016-02-21 DIAGNOSIS — I358 Other nonrheumatic aortic valve disorders: Secondary | ICD-10-CM | POA: Diagnosis not present

## 2016-02-21 DIAGNOSIS — M48061 Spinal stenosis, lumbar region without neurogenic claudication: Secondary | ICD-10-CM | POA: Insufficient documentation

## 2016-02-21 HISTORY — DX: Personal history of urinary calculi: Z87.442

## 2016-02-21 LAB — SURGICAL PCR SCREEN
MRSA, PCR: POSITIVE — AB
Staphylococcus aureus: POSITIVE — AB

## 2016-02-21 LAB — BASIC METABOLIC PANEL
Anion gap: 10 (ref 5–15)
BUN: 11 mg/dL (ref 6–20)
CO2: 21 mmol/L — ABNORMAL LOW (ref 22–32)
Calcium: 8.5 mg/dL — ABNORMAL LOW (ref 8.9–10.3)
Chloride: 107 mmol/L (ref 101–111)
Creatinine, Ser: 0.65 mg/dL (ref 0.61–1.24)
GFR calc Af Amer: >60 mL/min (ref >60)
GFR calc non Af Amer: >60 mL/min (ref >60)
Glucose, Bld: 74 mg/dL (ref 65–99)
Potassium: 4 mmol/L (ref 3.5–5.1)
Sodium: 138 mmol/L (ref 135–145)

## 2016-02-21 LAB — CBC WITH DIFFERENTIAL/PLATELET
Basophils Absolute: 0 10*3/uL (ref 0.0–0.1)
Basophils Relative: 1 %
Eosinophils Absolute: 0.1 10*3/uL (ref 0.0–0.7)
Eosinophils Relative: 2 %
HCT: 38.8 % — ABNORMAL LOW (ref 39.0–52.0)
Hemoglobin: 12.7 g/dL — ABNORMAL LOW (ref 13.0–17.0)
Lymphocytes Relative: 67 %
Lymphs Abs: 4.6 10*3/uL — ABNORMAL HIGH (ref 0.7–4.0)
MCH: 26.7 pg (ref 26.0–34.0)
MCHC: 32.7 g/dL (ref 30.0–36.0)
MCV: 81.7 fL (ref 78.0–100.0)
Monocytes Absolute: 0.8 10*3/uL (ref 0.1–1.0)
Monocytes Relative: 12 %
Neutro Abs: 1.4 10*3/uL — ABNORMAL LOW (ref 1.7–7.7)
Neutrophils Relative %: 20 %
Platelets: 216 10*3/uL (ref 150–400)
RBC: 4.75 MIL/uL (ref 4.22–5.81)
RDW: 17 % — ABNORMAL HIGH (ref 11.5–15.5)
WBC: 6.9 10*3/uL (ref 4.0–10.5)

## 2016-02-21 LAB — TYPE AND SCREEN
ABO/RH(D): A POS
Antibody Screen: NEGATIVE

## 2016-02-21 MED ORDER — CHLORHEXIDINE GLUCONATE CLOTH 2 % EX PADS: 6.0000 | MEDICATED_PAD | Freq: Once | CUTANEOUS | Status: DC

## 2016-02-21 NOTE — Patient Instructions (Addendum)
Medication Instructions:  RESUME ASPIRIN 81 MG AFTER YOUR SURGERY ONCE THE SURGEON FEELS IT IS SAFE FOR YOU TO RESUME  Labwork: NONOE  Testing/Procedures: NONE  Follow-Up: Your physician wants you to follow-up in: [place] DR. ROSS You will receive a reminder letter in the mail two months in advance. If you don't receive a letter, please call our office to schedule the follow-up appointment.  Any Other Special Instructions Will Be Listed Below (If Applicable). If you need a refill on your cardiac medications before your next appointment, please call your pharmacy.

## 2016-02-21 NOTE — Progress Notes (Signed)
PCP: Dr. Ronnald Ramp Cardiologist: Dr. Harrington Challenger. Pt to see Dr. Harrington Challenger after PAT appointment today for follow up.   ECHO: 03/03/16  EKG: 06/08/15  Pt surgery previously cancelled d/t low Hgb. Pt recently treated for kidney infection and states he is finishing up his antibiotics.   No chest pain, SOB at PAT appointment.

## 2016-02-21 NOTE — Progress Notes (Signed)
Cardiology Office Note:    Date:  02/21/2016   ID:  Bradley [name], DOB 1970-12-19, MRN [id number]  PCP:  [name], MD  Cardiologist:  Dr. [name]   Electrophysiologist:  n/a  Referring MD: [name], MD   Chief Complaint  Patient presents with  . Follow-up    heart murmur    History of Present Illness:    Bradley [name] is a 45 y.o. male with a hx of HTN, obesity s/p gastric bypass, cardiac murmur.  Evaluated by Dr. [name] in 12/16 for cardiac murmur.  Echo showed normal LVF and aortic sclerosis without stenosis.  Chest CT in the past has demonstrated coronary calcification.  He returns for Cardiology follow up.  He is here alone.  He has back surgery scheduled later this week with Dr. Trenton Gammon.  He was supposed to get surgery earlier this year but his Hgb was 7 and he had to undergo Iron infusions.  He noted dyspnea on exertion at that time.  His hemoglobin is normal now.  He denies any further shortness of breath.  He denies chest pain, syncope, orthopnea, PND, edema.    Prior CV studies that were reviewed today include:    Echo 03/04/15  EF 55-60, no RWMA, mod LAE, mild RAE, Aortic sclerosis without stenosis (mean 12/Peak 22)  Chest CT with contrast 11/16 IMPRESSION: 1. Minimal residual infiltrate in the right lower lobe and to a lesser degree right middle lobe in this patient who has documented pneumonia on 01/18/2015 in that region. No pleural effusion is seen. 2. Somewhat age advanced coronary artery calcifications involving the left anterior descending and circumflex arteries.  Past Medical History:  Diagnosis Date  . Anemia    since gastric bypass  . Anxiety   . Arthritis   . Arthropathy, unspecified, other specified sites   . Contact with or exposure to venereal diseases   . Depression   . Displacement of intervertebral disc, site unspecified, without myelopathy   . GERD (gastroesophageal reflux disease)   . Heart murmur    " slight, [place]  said nothing to worry about."  . History of kidney stones   . Hypertension     " no longer have hypertension since gastric bypass "  . Lumbago   . Obesity, unspecified   . Other, mixed, or unspecified nondependent drug abuse, unspecified   . Personal history of urinary calculi   . Pneumonia   . Priapism   . Stenosis, spinal, lumbar     Past Surgical History:  Procedure Laterality Date  . ABDOMINAL SURGERY    . ANKLE SURGERY    . BACK SURGERY    . GASTRIC BYPASS    . HERNIA REPAIR    . INCISION AND DRAINAGE ABSCESS Right 07/06/2004   great toe  . IRRIGATION AND DEBRIDEMENT ABSCESS Right 07/08/2004   great toe  . KNEE SURGERY    . MICRODISCECTOMY LUMBAR Left 08/24/2008   T12 - L1  . panectomy      Current Medications: Current Meds  Medication Sig  . acetaminophen (TYLENOL) 500 MG tablet Take 1,000 mg by mouth every 6 (six) hours as needed for mild pain. Reported on 07/14/2015  . ferrous sulfate 325 (65 FE) MG tablet Take 1 tablet (325 mg total) by mouth 3 (three) times daily with meals.  Marland Kitchen HYDROcodone-acetaminophen (NORCO) 10-325 MG tablet Take 1 tablet by mouth every 6 (six) hours as needed for moderate pain.   . Multiple Vitamin (  MULTIVITAMIN WITH MINERALS) TABS tablet Take 1 tablet by mouth 3 (three) times daily. Reported on 06/08/2015  . nitrofurantoin, macrocrystal-monohydrate, (MACROBID) 100 MG capsule Take 1 capsule (100 mg total) by mouth 2 (two) times daily. Take with food  . zinc gluconate 50 MG tablet Take 1 tablet (50 mg total) by mouth daily.     Allergies:   Codeine and Percocet [oxycodone-acetaminophen]   Social History   Social History  . Marital status: Married    Spouse name: N/A  . Number of children: N/A  . Years of education: N/A   Social History Main Topics  . Smoking status: Never Smoker  . Smokeless tobacco: Never Used  . Alcohol use No  . Drug use: No  . Sexual activity: Yes   Other Topics Concern  . None   Social History Narrative  . None       Family History:  The patient's family history includes Arthritis in his mother; Heart disease in his father; Hypertension in his father; Kidney disease in his father.   ROS:   Please see the history of present illness.    Review of Systems  Cardiovascular: Positive for dyspnea on exertion.  Musculoskeletal: Positive for back pain and joint pain.  Genitourinary: Positive for hematuria.   All other systems reviewed and are negative.   EKGs/Labs/Other Test Reviewed:    EKG:  EKG is  ordered today.  The ekg ordered today demonstrates NSR, HR 68, normal axis, QTc 423 ms  Recent Labs: 06/08/2015: TSH 0.56 02/10/2016: ALT 47 02/21/2016: BUN 11; Creatinine, Ser 0.65; Hemoglobin 12.7; Platelets 216; Potassium 4.0; Sodium 138   Recent Lipid Panel    Component Value Date/Time   CHOL 121 06/08/2015 1351   TRIG 57.0 06/08/2015 1351   HDL 45.70 06/08/2015 1351   CHOLHDL 3 06/08/2015 1351   VLDL 11.4 06/08/2015 1351   LDLCALC 64 06/08/2015 1351     Physical Exam:    VS:  BP 130/80   Pulse 68   Ht 5' 10"  (1.778 m)   Wt 225 lb (102.1 kg)   BMI 32.28 kg/m     Wt Readings from Last 3 Encounters:  02/21/16 225 lb (102.1 kg)  02/21/16 226 lb 4.8 oz (102.6 kg)  02/10/16 220 lb (99.8 kg)     Physical Exam  Constitutional: He is oriented to person, place, and time. He appears well-developed and well-nourished. No distress.  HENT:  Head: Normocephalic and atraumatic.  Neck: No JVD present.  Cardiovascular: Normal rate and regular rhythm.   Murmur heard.  Early systolic murmur is present with a grade of 2/6  at the upper right sternal border Abdominal: There is no tenderness.  Musculoskeletal: He exhibits no edema.  Neurological: He is alert and oriented to person, place, and time.  Skin: Skin is warm and dry.  Psychiatric: He has a normal mood and affect.    ASSESSMENT:    1. Aortic valve sclerosis   2. Coronary artery calcification seen on CT scan    PLAN:    In order  of problems listed above:  1. Aortic sclerosis - His murmur continues to sound benign.  He has no symptoms to suggest any changes.  He was short of breath with a hemoglobin of 7, but feels better now.  Consider repeat echocardiogram in 2-3 years.  2. Coronary calcification - Noted on prior CT.  LDL in 4/17 was 64.  Continue ASA 81 mg QD - he can resume after his lumbar  spine surgery.  BP is controlled off of medications.     Medication Adjustments/Labs and Tests Ordered: Current medicines are reviewed at length with the patient today.  Concerns regarding medicines are outlined above.  Medication changes, Labs and Tests ordered today are outlined in the Patient Instructions noted below. Patient Instructions  Medication Instructions:  RESUME ASPIRIN 81 MG AFTER YOUR SURGERY ONCE THE SURGEON FEELS IT IS SAFE FOR YOU TO RESUME  Labwork: NONOE  Testing/Procedures: NONE  Follow-Up: Your physician wants you to follow-up in: [place] DR. ROSS You will receive a reminder letter in the mail two months in advance. If you don't receive a letter, please call our office to schedule the follow-up appointment.  Any Other Special Instructions Will Be Listed Below (If Applicable). If you need a refill on your cardiac medications before your next appointment, please call your pharmacy.  Signed, Richardson Dopp, PA-C  02/21/2016 11:09 AM    [facility] [place], [place], [place]  [address] Phone: [phone]; Fax: [phone]

## 2016-02-21 NOTE — Progress Notes (Signed)
PCR positive for MRSA. Pt notified and prescription called to pharmacy.

## 2016-02-22 NOTE — Progress Notes (Signed)
Anesthesia Chart Review:  Pt is a 45 year old male scheduled for L2-3 anterior lateral lumbar fusion with percutaneous screws on 02/24/2016 with Earnie Larsson, MD.   Surgery was originally scheduled for surgery last April but it was cancelled due to low hgb of 7.6.   - PCP is [name], MD. -  Cardiologist is [name], MD who follows pt for aortic sclerosis; last office visit 02/21/16 with Richardson Dopp, PA who cleared pt for surgery.   PMH includes:  HTN (no longer has since gastric bypass), heart murmur (aortic sclerosis), anemia, hx drug abuse, GERD. Never smoker. BMI 32.   Medications include: iron, macrobid (started 02/14/16 for kidney infection)  Preoperative labs reviewed.     Chest x-ray 01/27/15 reviewed. Most of the right lower lobe infiltrate has cleared with only small residual patchy opacity remaining in this area. Lungs elsewhere clear. No new opacity.  EKG 02/21/16: NSR.   Echo 03/04/15:  - Left ventricle: The cavity size was normal. Systolic function was normal. The estimated ejection fraction was in the range of 55% to 60%. Wall motion was normal; there were no regional wall motion abnormalities. - Left atrium: The atrium was moderately dilated. - Right atrium: The atrium was mildly dilated. - Atrial septum: No defect or patent foramen ovale was identified.  If no changes, I anticipate pt can proceed with surgery as scheduled.   Willeen Cass, FNP-BC [facility]/Anesthesiology Phone: [phone] 02/22/2016 4:05 PM

## 2016-02-23 NOTE — Progress Notes (Signed)
Called and informed of time change surgery time is 1230 informed to arrive at 1030 voices understanding.

## 2016-02-24 ENCOUNTER — Encounter (HOSPITAL_COMMUNITY)
Admission: RE | Disposition: A | Discharge status: Home / Self Care | Payer: Self-pay | Source: Ambulatory Visit | Attending: Neurosurgery

## 2016-02-24 ENCOUNTER — Inpatient Hospital Stay (HOSPITAL_COMMUNITY): Payer: Managed Care, Other (non HMO)

## 2016-02-24 ENCOUNTER — Inpatient Hospital Stay (HOSPITAL_COMMUNITY): Payer: Managed Care, Other (non HMO) | Admitting: Certified Registered"

## 2016-02-24 ENCOUNTER — Inpatient Hospital Stay (HOSPITAL_COMMUNITY)
Admission: RE | Admit: 2016-02-24 | Discharge: 2016-02-26 | DRG: 460 | Disposition: A | Discharge status: Home / Self Care | Payer: Managed Care, Other (non HMO) | Source: Ambulatory Visit | Attending: Neurosurgery | Admitting: Neurosurgery

## 2016-02-24 ENCOUNTER — Encounter (HOSPITAL_COMMUNITY): Payer: Self-pay | Admitting: Certified Registered"

## 2016-02-24 DIAGNOSIS — Z79899 Other long term (current) drug therapy: Secondary | ICD-10-CM

## 2016-02-24 DIAGNOSIS — Z885 Allergy status to narcotic agent status: Secondary | ICD-10-CM

## 2016-02-24 DIAGNOSIS — R2689 Other abnormalities of gait and mobility: Secondary | ICD-10-CM

## 2016-02-24 DIAGNOSIS — M48061 Spinal stenosis, lumbar region without neurogenic claudication: Principal | ICD-10-CM | POA: Diagnosis present

## 2016-02-24 DIAGNOSIS — Z9884 Bariatric surgery status: Secondary | ICD-10-CM

## 2016-02-24 DIAGNOSIS — Z981 Arthrodesis status: Secondary | ICD-10-CM | POA: Diagnosis not present

## 2016-02-24 DIAGNOSIS — F329 Major depressive disorder, single episode, unspecified: Secondary | ICD-10-CM | POA: Diagnosis present

## 2016-02-24 DIAGNOSIS — M4310 Spondylolisthesis, site unspecified: Secondary | ICD-10-CM | POA: Diagnosis present

## 2016-02-24 DIAGNOSIS — Z8249 Family history of ischemic heart disease and other diseases of the circulatory system: Secondary | ICD-10-CM | POA: Diagnosis not present

## 2016-02-24 DIAGNOSIS — Z419 Encounter for procedure for purposes other than remedying health state, unspecified: Secondary | ICD-10-CM

## 2016-02-24 DIAGNOSIS — D649 Anemia, unspecified: Secondary | ICD-10-CM | POA: Diagnosis present

## 2016-02-24 DIAGNOSIS — G8929 Other chronic pain: Secondary | ICD-10-CM

## 2016-02-24 DIAGNOSIS — Z202 Contact with and (suspected) exposure to infections with a predominantly sexual mode of transmission: Secondary | ICD-10-CM | POA: Diagnosis present

## 2016-02-24 DIAGNOSIS — K219 Gastro-esophageal reflux disease without esophagitis: Secondary | ICD-10-CM | POA: Diagnosis present

## 2016-02-24 DIAGNOSIS — M170 Bilateral primary osteoarthritis of knee: Secondary | ICD-10-CM

## 2016-02-24 DIAGNOSIS — M545 Low back pain: Secondary | ICD-10-CM | POA: Diagnosis present

## 2016-02-24 HISTORY — PX: ANTERIOR LAT LUMBAR FUSION: SHX1168

## 2016-02-24 HISTORY — PX: LUMBAR PERCUTANEOUS PEDICLE SCREW 1 LEVEL: SHX5560

## 2016-02-24 SURGERY — ANTERIOR LATERAL LUMBAR FUSION 1 LEVEL
Anesthesia: General | Site: Spine Lumbar | Laterality: Left

## 2016-02-24 MED ORDER — 0.9 % SODIUM CHLORIDE (POUR BTL) OPTIME
TOPICAL | Status: DC | PRN
  Administered 2016-02-24: 1000 mL

## 2016-02-24 MED ORDER — CEFAZOLIN IN D5W 1 GM/50ML IV SOLN
1.0000 g | Freq: Three times a day (TID) | INTRAVENOUS | Status: AC
  Administered 2016-02-24 – 2016-02-25 (×2): 1 g via INTRAVENOUS
  Filled 2016-02-24 (×2): qty 50

## 2016-02-24 MED ORDER — HYDROCODONE-ACETAMINOPHEN 5-325 MG PO TABS
1.0000 | ORAL_TABLET | ORAL | Status: DC | PRN
  Administered 2016-02-24 – 2016-02-25 (×2): 2 via ORAL
  Filled 2016-02-24: qty 2

## 2016-02-24 MED ORDER — ACETAMINOPHEN 650 MG RE SUPP: 650.0000 mg | RECTAL | Status: DC | PRN

## 2016-02-24 MED ORDER — FENTANYL CITRATE (PF) 100 MCG/2ML IJ SOLN: 25.0000 ug | INTRAMUSCULAR | Status: DC | PRN

## 2016-02-24 MED ORDER — ONDANSETRON HCL 4 MG/2ML IJ SOLN
INTRAMUSCULAR | Status: AC
  Filled 2016-02-24: qty 2

## 2016-02-24 MED ORDER — ADULT MULTIVITAMIN W/MINERALS CH
1.0000 | ORAL_TABLET | Freq: Three times a day (TID) | ORAL | Status: DC
  Administered 2016-02-24 – 2016-02-26 (×5): 1 via ORAL
  Filled 2016-02-24 (×5): qty 1

## 2016-02-24 MED ORDER — ONDANSETRON HCL 4 MG/2ML IJ SOLN
INTRAMUSCULAR | Status: DC | PRN
  Administered 2016-02-24: 4 mg via INTRAVENOUS

## 2016-02-24 MED ORDER — CEFAZOLIN SODIUM-DEXTROSE 2-4 GM/100ML-% IV SOLN
INTRAVENOUS | Status: AC
  Filled 2016-02-24: qty 100

## 2016-02-24 MED ORDER — ONDANSETRON HCL 4 MG/2ML IJ SOLN: 4.0000 mg | Freq: Once | INTRAMUSCULAR | Status: DC | PRN

## 2016-02-24 MED ORDER — MIDAZOLAM HCL 5 MG/5ML IJ SOLN
INTRAMUSCULAR | Status: DC | PRN
  Administered 2016-02-24: 2 mg via INTRAVENOUS

## 2016-02-24 MED ORDER — HYDROMORPHONE HCL 1 MG/ML IJ SOLN
0.5000 mg | INTRAMUSCULAR | Status: DC | PRN
  Administered 2016-02-24: 0.5 mg via INTRAVENOUS

## 2016-02-24 MED ORDER — FERROUS SULFATE 325 (65 FE) MG PO TABS
325.0000 mg | ORAL_TABLET | Freq: Three times a day (TID) | ORAL | Status: DC
  Administered 2016-02-25 – 2016-02-26 (×4): 325 mg via ORAL
  Filled 2016-02-24 (×4): qty 1

## 2016-02-24 MED ORDER — DIAZEPAM 5 MG PO TABS
ORAL_TABLET | ORAL | Status: AC
  Filled 2016-02-24: qty 2

## 2016-02-24 MED ORDER — SODIUM CHLORIDE 0.9% FLUSH: 3.0000 mL | INTRAVENOUS | Status: DC | PRN

## 2016-02-24 MED ORDER — SODIUM CHLORIDE 0.9% FLUSH
3.0000 mL | Freq: Two times a day (BID) | INTRAVENOUS | Status: DC
  Administered 2016-02-24: 10 mL via INTRAVENOUS
  Administered 2016-02-25: 3 mL via INTRAVENOUS

## 2016-02-24 MED ORDER — MENTHOL 3 MG MT LOZG: 1.0000 | LOZENGE | OROMUCOSAL | Status: DC | PRN

## 2016-02-24 MED ORDER — PROPOFOL 10 MG/ML IV BOLUS
INTRAVENOUS | Status: DC | PRN
  Administered 2016-02-24: 200 mg via INTRAVENOUS

## 2016-02-24 MED ORDER — ONDANSETRON HCL 4 MG/2ML IJ SOLN: 4.0000 mg | INTRAMUSCULAR | Status: DC | PRN

## 2016-02-24 MED ORDER — BUPIVACAINE HCL (PF) 0.25 % IJ SOLN
INTRAMUSCULAR | Status: DC | PRN
  Administered 2016-02-24: 30 mL

## 2016-02-24 MED ORDER — DIAZEPAM 5 MG PO TABS
5.0000 mg | ORAL_TABLET | Freq: Four times a day (QID) | ORAL | Status: DC | PRN
  Administered 2016-02-24 (×2): 10 mg via ORAL
  Administered 2016-02-25 – 2016-02-26 (×3): 5 mg via ORAL
  Filled 2016-02-24: qty 2
  Filled 2016-02-24 (×3): qty 1

## 2016-02-24 MED ORDER — PHENYLEPHRINE HCL 10 MG/ML IJ SOLN: INTRAVENOUS | Status: DC | PRN

## 2016-02-24 MED ORDER — MIDAZOLAM HCL 2 MG/2ML IJ SOLN
INTRAMUSCULAR | Status: AC
  Filled 2016-02-24: qty 2

## 2016-02-24 MED ORDER — BACITRACIN 50000 UNITS IM SOLR
INTRAMUSCULAR | Status: DC | PRN
  Administered 2016-02-24: 14:00:00

## 2016-02-24 MED ORDER — FENTANYL CITRATE (PF) 100 MCG/2ML IJ SOLN
INTRAMUSCULAR | Status: AC
  Filled 2016-02-24: qty 4

## 2016-02-24 MED ORDER — HYDROCODONE-ACETAMINOPHEN 5-325 MG PO TABS
ORAL_TABLET | ORAL | Status: AC
  Filled 2016-02-24: qty 2

## 2016-02-24 MED ORDER — DEXAMETHASONE SODIUM PHOSPHATE 10 MG/ML IJ SOLN
INTRAMUSCULAR | Status: AC
  Filled 2016-02-24: qty 1

## 2016-02-24 MED ORDER — SODIUM CHLORIDE 0.9 % IV SOLN: 250.0000 mL | INTRAVENOUS | Status: DC

## 2016-02-24 MED ORDER — FENTANYL CITRATE (PF) 100 MCG/2ML IJ SOLN
INTRAMUSCULAR | Status: AC
  Filled 2016-02-24: qty 2

## 2016-02-24 MED ORDER — PROPOFOL 500 MG/50ML IV EMUL
INTRAVENOUS | Status: DC | PRN
  Administered 2016-02-24: 75 ug/kg/min via INTRAVENOUS

## 2016-02-24 MED ORDER — LIDOCAINE 2% (20 MG/ML) 5 ML SYRINGE
INTRAMUSCULAR | Status: DC | PRN
  Administered 2016-02-24: 60 mg via INTRAVENOUS

## 2016-02-24 MED ORDER — ACETAMINOPHEN 325 MG PO TABS: 650.0000 mg | ORAL_TABLET | ORAL | Status: DC | PRN

## 2016-02-24 MED ORDER — CEFAZOLIN SODIUM-DEXTROSE 2-4 GM/100ML-% IV SOLN
2.0000 g | INTRAVENOUS | Status: AC
  Administered 2016-02-24: 2 g via INTRAVENOUS

## 2016-02-24 MED ORDER — NITROFURANTOIN MONOHYD MACRO 100 MG PO CAPS
100.0000 mg | ORAL_CAPSULE | Freq: Two times a day (BID) | ORAL | Status: DC
  Administered 2016-02-24 – 2016-02-26 (×4): 100 mg via ORAL
  Filled 2016-02-24 (×4): qty 1

## 2016-02-24 MED ORDER — PHENOL 1.4 % MT LIQD: 1.0000 | OROMUCOSAL | Status: DC | PRN

## 2016-02-24 MED ORDER — HYDROMORPHONE HCL 1 MG/ML IJ SOLN
INTRAMUSCULAR | Status: AC
  Filled 2016-02-24: qty 0.5

## 2016-02-24 MED ORDER — BUPIVACAINE HCL (PF) 0.25 % IJ SOLN
INTRAMUSCULAR | Status: AC
  Filled 2016-02-24: qty 30

## 2016-02-24 MED ORDER — ROCURONIUM BROMIDE 50 MG/5ML IV SOSY
PREFILLED_SYRINGE | INTRAVENOUS | Status: AC
  Filled 2016-02-24: qty 5

## 2016-02-24 MED ORDER — EPHEDRINE SULFATE 50 MG/ML IJ SOLN
INTRAMUSCULAR | Status: DC | PRN
  Administered 2016-02-24: 10 mg via INTRAVENOUS

## 2016-02-24 MED ORDER — SUCCINYLCHOLINE CHLORIDE 200 MG/10ML IV SOSY
PREFILLED_SYRINGE | INTRAVENOUS | Status: DC | PRN
  Administered 2016-02-24: 140 mg via INTRAVENOUS

## 2016-02-24 MED ORDER — THROMBIN 5000 UNITS EX SOLR
CUTANEOUS | Status: DC | PRN
  Administered 2016-02-24 (×2): 5000 [IU] via TOPICAL

## 2016-02-24 MED ORDER — FENTANYL CITRATE (PF) 100 MCG/2ML IJ SOLN
INTRAMUSCULAR | Status: DC | PRN
  Administered 2016-02-24: 50 ug via INTRAVENOUS
  Administered 2016-02-24: 100 ug via INTRAVENOUS
  Administered 2016-02-24: 50 ug via INTRAVENOUS
  Administered 2016-02-24: 100 ug via INTRAVENOUS

## 2016-02-24 MED ORDER — THROMBIN 5000 UNITS EX SOLR
CUTANEOUS | Status: AC
  Filled 2016-02-24: qty 10000

## 2016-02-24 MED ORDER — FENTANYL CITRATE (PF) 100 MCG/2ML IJ SOLN
25.0000 ug | INTRAMUSCULAR | Status: DC | PRN
  Administered 2016-02-24 (×3): 50 ug via INTRAVENOUS

## 2016-02-24 MED ORDER — PHENYLEPHRINE HCL 10 MG/ML IJ SOLN
INTRAMUSCULAR | Status: DC | PRN
  Administered 2016-02-24: 80 ug via INTRAVENOUS

## 2016-02-24 MED ORDER — LIDOCAINE 2% (20 MG/ML) 5 ML SYRINGE
INTRAMUSCULAR | Status: AC
  Filled 2016-02-24: qty 5

## 2016-02-24 MED ORDER — HYDROMORPHONE HCL 2 MG PO TABS
2.0000 mg | ORAL_TABLET | ORAL | Status: DC | PRN
  Administered 2016-02-24: 2 mg via ORAL
  Administered 2016-02-25 – 2016-02-26 (×8): 4 mg via ORAL
  Filled 2016-02-24 (×4): qty 2
  Filled 2016-02-24: qty 1
  Filled 2016-02-24 (×4): qty 2

## 2016-02-24 MED ORDER — DEXAMETHASONE SODIUM PHOSPHATE 10 MG/ML IJ SOLN
10.0000 mg | INTRAMUSCULAR | Status: AC
  Administered 2016-02-24: 10 mg via INTRAVENOUS

## 2016-02-24 MED ORDER — LACTATED RINGERS IV SOLN
INTRAVENOUS | Status: DC
  Administered 2016-02-24 (×2): via INTRAVENOUS

## 2016-02-24 MED ORDER — HEMOSTATIC AGENTS (NO CHARGE) OPTIME
TOPICAL | Status: DC | PRN
  Administered 2016-02-24: 1 via TOPICAL

## 2016-02-24 SURGICAL SUPPLY — 64 items
ADH SKN CLS APL DERMABOND .7 (GAUZE/BANDAGES/DRESSINGS) ×2
APL SKNCLS STERI-STRIP NONHPOA (GAUZE/BANDAGES/DRESSINGS) ×4
BAG DECANTER FOR FLEXI CONT (MISCELLANEOUS) ×8 IMPLANT
BENZOIN TINCTURE PRP APPL 2/3 (GAUZE/BANDAGES/DRESSINGS) ×8 IMPLANT
BLADE CLIPPER SURG (BLADE) IMPLANT
BONE MATRIX OSTEOCEL PRO MED (Bone Implant) ×2 IMPLANT
CAGE COROENT XL 10X18X60-10 (Cage) ×1 IMPLANT
CAGE COROENT XL 10X18X60MM-10 (Cage) ×1 IMPLANT
CARTRIDGE OIL MAESTRO DRILL (MISCELLANEOUS) ×4 IMPLANT
CLOSURE WOUND 1/2 X4 (GAUZE/BANDAGES/DRESSINGS) ×1
CONT SPEC 4OZ CLIKSEAL STRL BL (MISCELLANEOUS) ×4 IMPLANT
COVER BACK TABLE 24X17X13 BIG (DRAPES) IMPLANT
COVER BACK TABLE 60X90IN (DRAPES) ×4 IMPLANT
DERMABOND ADVANCED (GAUZE/BANDAGES/DRESSINGS) ×2
DERMABOND ADVANCED .7 DNX12 (GAUZE/BANDAGES/DRESSINGS) ×4 IMPLANT
DIFFUSER DRILL AIR PNEUMATIC (MISCELLANEOUS) ×8 IMPLANT
DRAPE C-ARM 42X72 X-RAY (DRAPES) ×8 IMPLANT
DRAPE C-ARMOR (DRAPES) ×8 IMPLANT
DRAPE LAPAROTOMY 100X72X124 (DRAPES) ×8 IMPLANT
DRAPE POUCH INSTRU U-SHP 10X18 (DRAPES) ×4 IMPLANT
DRAPE SURG 17X23 STRL (DRAPES) ×24 IMPLANT
DRSG OPSITE POSTOP 3X4 (GAUZE/BANDAGES/DRESSINGS) ×2 IMPLANT
DRSG OPSITE POSTOP 4X6 (GAUZE/BANDAGES/DRESSINGS) ×2 IMPLANT
ELECT BLADE 4.0 EZ CLEAN MEGAD (MISCELLANEOUS)
ELECT BLADE 6.5 EXT (BLADE) IMPLANT
ELECT REM PT RETURN 9FT ADLT (ELECTROSURGICAL) ×8
ELECTRODE BLDE 4.0 EZ CLN MEGD (MISCELLANEOUS) IMPLANT
ELECTRODE REM PT RTRN 9FT ADLT (ELECTROSURGICAL) ×4 IMPLANT
EVACUATOR 1/8 PVC DRAIN (DRAIN) IMPLANT
GAUZE SPONGE 4X4 12PLY STRL (GAUZE/BANDAGES/DRESSINGS) ×4 IMPLANT
GAUZE SPONGE 4X4 16PLY XRAY LF (GAUZE/BANDAGES/DRESSINGS) IMPLANT
GLOVE ECLIPSE 9.0 STRL (GLOVE) ×8 IMPLANT
GLOVE EXAM NITRILE LRG STRL (GLOVE) IMPLANT
GLOVE EXAM NITRILE XL STR (GLOVE) IMPLANT
GLOVE EXAM NITRILE XS STR PU (GLOVE) IMPLANT
GOWN STRL REUS W/ TWL LRG LVL3 (GOWN DISPOSABLE) IMPLANT
GOWN STRL REUS W/ TWL XL LVL3 (GOWN DISPOSABLE) ×6 IMPLANT
GOWN STRL REUS W/TWL 2XL LVL3 (GOWN DISPOSABLE) IMPLANT
GOWN STRL REUS W/TWL LRG LVL3 (GOWN DISPOSABLE)
GOWN STRL REUS W/TWL XL LVL3 (GOWN DISPOSABLE) ×12
GUIDEWIRE NITINOL BEVEL TIP (WIRE) ×6 IMPLANT
KIT BASIN OR (CUSTOM PROCEDURE TRAY) ×8 IMPLANT
KIT DILATOR XLIF 5 (KITS) IMPLANT
KIT ROOM TURNOVER OR (KITS) ×8 IMPLANT
KIT SURGICAL ACCESS MAXCESS 4 (KITS) ×2 IMPLANT
KIT XLIF (KITS) ×2
MODULE NVM5 NEXT GEN EMG (NEEDLE) ×2 IMPLANT
NEEDLE HYPO 22GX1.5 SAFETY (NEEDLE) ×8 IMPLANT
NS IRRIG 1000ML POUR BTL (IV SOLUTION) ×8 IMPLANT
OIL CARTRIDGE MAESTRO DRILL (MISCELLANEOUS) ×8
PACK LAMINECTOMY NEURO (CUSTOM PROCEDURE TRAY) ×8 IMPLANT
PUTTY BONE DBX 2.5 MIS (Bone Implant) ×2 IMPLANT
ROD RELINE MAS TI LORD 5.5X40 (Rod) ×2 IMPLANT
SCREW LOCK RELINE 5.5 TULIP (Screw) ×4 IMPLANT
SCREW MAS RELINE 6.5X50 POLY (Screw) ×4 IMPLANT
SPONGE LAP 4X18 X RAY DECT (DISPOSABLE) IMPLANT
SPONGE SURGIFOAM ABS GEL SZ50 (HEMOSTASIS) IMPLANT
STRIP CLOSURE SKIN 1/2X4 (GAUZE/BANDAGES/DRESSINGS) ×3 IMPLANT
SUT VIC AB 2-0 CT1 18 (SUTURE) ×12 IMPLANT
SUT VIC AB 3-0 SH 8-18 (SUTURE) ×12 IMPLANT
TOWEL OR 17X24 6PK STRL BLUE (TOWEL DISPOSABLE) ×8 IMPLANT
TOWEL OR 17X26 10 PK STRL BLUE (TOWEL DISPOSABLE) ×8 IMPLANT
TRAY FOLEY W/METER SILVER 16FR (SET/KITS/TRAYS/PACK) ×4 IMPLANT
WATER STERILE IRR 1000ML POUR (IV SOLUTION) ×8 IMPLANT

## 2016-02-24 NOTE — Op Note (Signed)
Date of procedure: 02/24/2016  Date of dictation: Same  Service: Neurosurgery  Preoperative diagnosis: L2-3 degenerative/post laminectomy spondylolisthesis with foraminal stenosis  Postoperative diagnosis: Same  Procedure Name: Left L2-L3 anterior lateral retroperitoneal interbody decompression and fusion utilizing interbody peek cage and morselized allograft.  Left L2, L3 posterior percutaneous pedicle screw fixation     Surgeon:Baylin Gamblin A.Linard Daft, M.D.  Asst. Surgeon: Vertell Limber  Anesthesia: General  Indication: 45 year old male with intractable back and left anterior thigh pain. Workup demonstrates evidence of lateral and anterior listhesis of L2 on L3 with foraminal collapse and stenosis. Patient status post previous extraforaminal microdiscectomy on the left 2. Patient is failed conservative management presents now for anterior lateral retroperitoneal decompression infusion in hopes of improving his symptoms.  Operative note: After induction of anesthesia, patient position in the right lateral decubitus position and a properly padded. Left flank and lumbar region prepped and draped sterilely. Utilizing fluoroscopic guidance plan incision planes overlying the L2-3 disc space were identified. Incision was made overlying the L2-3 disc space and a secondary incision was made in the left lateral flank. Using the flank incision blunt dissection was made into the retroperitoneal space. Peroneal contents were then mobilized and swept anteriorly. A dilator was then passed through the lateral wound and docked into the disc space of L2 and L3. Intraoperative neural monitoring was used to confirm there were no adjacent nerve structures from the upper lumbar plexus. Guidewire was then passed into the L2-3 disc space. The dilators were progressively enlarged again using nerve stimulation to rule out adjacent nerves throughout. Self retainer retractor was placed. This was docked and secured in place. His  expanded gently. The disc space itself was examined under direct vision. Once again there is no evidence of overlying or traversing elements of the lumbar plexus. Direct stimulation was carried out along the lateral body and disc space once again this was free from any evidence of nerve presents. Disc spaces incised. Discectomies and performed using various instruments. Contralateral Lee's was performed. Disc spaces then sized to a 10 mm lordotic height. A 10 mm x 60 mm x 18 mmNuvasive cage was packed with morselized allograft. This was then impacted into place using guides to prevent graft extravasation. This was confirmed to be in good position both AP and lateral planes. Retractor was removed. Hemostasis was good. Attention was then placed toward percutaneous posterior instrumentation. Stab incisions were made overlying the pedicles of L2 and L3 on the left side. Jamshidi needles were then passed into the L2 and L3 pedicles under fluoroscopic guidance and again using nerve monitoring. Guidewires were placed into the vertebral bodies. The pedicles were then tapped again using neural monitoring. 6.5 x 50 mm screws were placed on the left side at L2 and L3. Short segment titanium rods and placed over the screw heads at L2 and L3. Locking caps were placed over the screws. Locking caps were then engaged with the construct under mild compression. Final images revealed good position of the cage and the hardware at the proper operative level with improved alignment of the spine. Wounds were irrigated with and like solution and then closed in a typical fashion. No apparent complications. Patient tolerated the procedure well and he returns to the recovery room postop.

## 2016-02-24 NOTE — Progress Notes (Signed)
[name] 9/10 pain after IV Fentanyl & po Valium. Dr Linna Caprice here and aware- med pt with IV Precedex.  Pt appears much more comfortable/asleep. Will continue to monitor.

## 2016-02-24 NOTE — Brief Op Note (Signed)
02/24/2016  3:06 PM  PATIENT:  Otho Perl  45 y.o. male  PRE-OPERATIVE DIAGNOSIS:  Lumbar foraminal stenosis  POST-OPERATIVE DIAGNOSIS:  Lumbar foraminal stenosis  PROCEDURE:  Procedure(s): LEFT LUMBAR TWO-THREE  ANTERIOR LATERAL LUMBAR FUSION   (Left) LUMBAR PERCUTANEOUS PEDICLE SCREW 1 LEVEL (Left)  SURGEON:  Surgeon(s) and Role:    * Earnie Larsson, MD - Primary    * Erline Levine, MD - Assisting  PHYSICIAN ASSISTANT:   ASSISTANTS:    ANESTHESIA:   general  EBL:  Total I/O In: 1000 [I.V.:1000] Out: 50 [Blood:50]  BLOOD ADMINISTERED:none  DRAINS: none   LOCAL MEDICATIONS USED:  MARCAINE     SPECIMEN:  No Specimen  DISPOSITION OF SPECIMEN:  N/A  COUNTS:  YES  TOURNIQUET:  * No tourniquets in log *  DICTATION: .Dragon Dictation  PLAN OF CARE: Admit to inpatient   PATIENT DISPOSITION:  PACU - hemodynamically stable.   Delay start of Pharmacological VTE agent (>24hrs) due to surgical blood loss or risk of bleeding: yes

## 2016-02-24 NOTE — Anesthesia Preprocedure Evaluation (Addendum)
Anesthesia Evaluation  Patient identified by MRN, date of birth, ID band Patient awake    Reviewed: Allergy & Precautions, NPO status , Patient's Chart, lab work & pertinent test results  Airway Mallampati: II  TM Distance: >3 FB Neck ROM: Full    Dental  (+) Edentulous Upper, Partial Lower   Pulmonary    breath sounds clear to auscultation       Cardiovascular hypertension,  Rhythm:Regular Rate:Normal     Neuro/Psych    GI/Hepatic   Endo/Other    Renal/GU      Musculoskeletal   Abdominal   Peds  Hematology   Anesthesia Other Findings   Reproductive/Obstetrics                            Anesthesia Physical Anesthesia Plan  ASA: II  Anesthesia Plan: General   Post-op Pain Management:    Induction: Intravenous  Airway Management Planned: Oral ETT  Additional Equipment:   Intra-op Plan:   Post-operative Plan: Extubation in OR  Informed Consent:   Dental advisory given  Plan Discussed with: CRNA and Anesthesiologist  Anesthesia Plan Comments:         Anesthesia Quick Evaluation

## 2016-02-24 NOTE — H&P (Signed)
Bradley [name] is an 45 y.o. male.   Chief Complaint: Back pain HPI: 45 year old male with back pain with radiation into his left anterior thigh. Patient is status post previous microdiscectomies extraforaminal area L2-3 on 2 different occasions. Patient with evidence of progressive anterior and lateral listhesis of L2 on L3 with persistent foraminal stenosis and evidence of instability. Patient is failed conservative management and presents now for decompression and fusion in hopes of improving his symptoms at L2-3.  Past Medical History:  Diagnosis Date  . Anemia    since gastric bypass  . Anxiety   . Arthritis   . Arthropathy, unspecified, other specified sites   . Contact with or exposure to venereal diseases   . Depression   . Displacement of intervertebral disc, site unspecified, without myelopathy   . GERD (gastroesophageal reflux disease)   . Heart murmur    " slight, [place] said nothing to worry about."  . History of kidney stones   . Hypertension     " no longer have hypertension since gastric bypass "  . Lumbago   . Obesity, unspecified   . Other, mixed, or unspecified nondependent drug abuse, unspecified   . Personal history of urinary calculi   . Pneumonia   . Priapism   . Stenosis, spinal, lumbar     Past Surgical History:  Procedure Laterality Date  . ABDOMINAL SURGERY    . ANKLE SURGERY    . BACK SURGERY    . GASTRIC BYPASS    . HERNIA REPAIR    . INCISION AND DRAINAGE ABSCESS Right 07/06/2004   great toe  . IRRIGATION AND DEBRIDEMENT ABSCESS Right 07/08/2004   great toe  . KNEE SURGERY    . MICRODISCECTOMY LUMBAR Left 08/24/2008   T12 - L1  . panectomy      Family History  Problem Relation Age of Onset  . Arthritis Mother   . Heart disease Father   . Hypertension Father   . Kidney disease Father    Social History:  reports that he has never smoked. He has never used smokeless tobacco. He reports that he does not drink alcohol or use  drugs.  Allergies:  Allergies  Allergen Reactions  . Codeine Hives    Hives and itching  . Percocet [Oxycodone-Acetaminophen] Hives    Hives and itching    Medications Prior to Admission  Medication Sig Dispense Refill  . acetaminophen (TYLENOL) 500 MG tablet Take 1,000 mg by mouth every 6 (six) hours as needed for mild pain. Reported on 07/14/2015    . ferrous sulfate 325 (65 FE) MG tablet Take 1 tablet (325 mg total) by mouth 3 (three) times daily with meals. 90 tablet 11  . HYDROcodone-acetaminophen (NORCO) 10-325 MG tablet Take 1 tablet by mouth every 6 (six) hours as needed for moderate pain.     . Multiple Vitamin (MULTIVITAMIN WITH MINERALS) TABS tablet Take 1 tablet by mouth 3 (three) times daily. Reported on 06/08/2015    . nitrofurantoin, macrocrystal-monohydrate, (MACROBID) 100 MG capsule Take 1 capsule (100 mg total) by mouth 2 (two) times daily. Take with food 20 capsule 0  . zinc gluconate 50 MG tablet Take 1 tablet (50 mg total) by mouth daily. 90 tablet 3    No results found for this or any previous visit (from the past 48 hour(s)). No results found.  Pertinent items noted in HPI and remainder of comprehensive ROS otherwise negative.  Blood pressure (!) 148/89, pulse 73, temperature 98.6 F (  37 C), temperature source Oral, resp. rate 18, height 5' 10"  (1.778 m), weight 102.1 kg (225 lb), SpO2 100 %.  Patient is awake and alert. He is oriented and appropriate. His speech is fluent. His judgment and insight are intact. Cranial nerve function intact bilaterally. Motor examination intact except for left hip flexors 4/5. Sensory examination normal except left L2 dermatome decreased sensation to pinprick and light touch. Reflexes normal except Achilles reflex is absent bilaterally. No evidence of long track signs. Gait antalgic. Posture flexed. Examination head ears eyes and thirds unremarkable. Chest and abdomen are benign. Extremities free from injury or  deformity. Assessment/Plan Degenerative/post laminectomy spondylolisthesis with foraminal stenosis. Plan left L2-3 anterior lateral retroperitoneal interbody decompression and fusion with posterior percutaneous pedicle screw fixation. Risks and benefits of been explained. Patient wishes to proceed.  Vallory Oetken A 02/24/2016, 12:49 PM

## 2016-02-24 NOTE — Anesthesia Postprocedure Evaluation (Signed)
Anesthesia Post Note  Patient: HARLEY FITZWATER  Procedure(s) Performed: Procedure(s) (LRB): LEFT LUMBAR TWO-THREE  ANTERIOR LATERAL LUMBAR FUSION   (Left) LUMBAR PERCUTANEOUS PEDICLE SCREW 1 LEVEL (Left)  Patient location during evaluation: PACU Anesthesia Type: General Level of consciousness: awake, awake and alert and oriented Pain management: pain level controlled Vital Signs Assessment: post-procedure vital signs reviewed and stable Respiratory status: spontaneous breathing, nonlabored ventilation and respiratory function stable Cardiovascular status: blood pressure returned to baseline Anesthetic complications: no       Last Vitals:  Vitals:   02/24/16 1630 02/24/16 1644  BP: (!) 174/95   Pulse: 72 71  Resp: 13 12  Temp:      Last Pain:  Vitals:   02/24/16 1617  TempSrc:   PainSc: 9                  Larosa Rhines COKER

## 2016-02-24 NOTE — Anesthesia Procedure Notes (Signed)
Procedure Name: Intubation Date/Time: 02/24/2016 1:08 PM Performed by: Myna Bright Pre-anesthesia Checklist: Patient identified, Emergency Drugs available, Suction available and Patient being monitored Patient Re-evaluated:Patient Re-evaluated prior to inductionOxygen Delivery Method: Circle system utilized Preoxygenation: Pre-oxygenation with 100% oxygen Intubation Type: IV induction Ventilation: Mask ventilation without difficulty Laryngoscope Size: Mac and 4 Grade View: Grade I Tube type: Oral Tube size: 7.5 mm Number of attempts: 1 Airway Equipment and Method: Stylet Placement Confirmation: ETT inserted through vocal cords under direct vision,  positive ETCO2 and breath sounds checked- equal and bilateral Secured at: 22 cm Tube secured with: Tape Dental Injury: Teeth and Oropharynx as per pre-operative assessment

## 2016-02-24 NOTE — Transfer of Care (Signed)
Immediate Anesthesia Transfer of Care Note  Patient: Bradley [name]  Procedure(s) Performed: Procedure(s): LEFT LUMBAR TWO-THREE  ANTERIOR LATERAL LUMBAR FUSION   (Left) LUMBAR PERCUTANEOUS PEDICLE SCREW 1 LEVEL (Left)  Patient Location: PACU  Anesthesia Type:General  Level of Consciousness: awake, alert , oriented and patient cooperative  Airway & Oxygen Therapy: Patient Spontanous Breathing and Patient connected to nasal cannula oxygen  Post-op Assessment: Report given to RN, Post -op Vital signs reviewed and stable and Patient moving all extremities  Post vital signs: Reviewed and stable  Last Vitals:  Vitals:   02/24/16 1044  BP: (!) 148/89  Pulse: 73  Resp: 18  Temp: 37 C    Last Pain:  Vitals:   02/24/16 1048  TempSrc:   PainSc: 8       Patients Stated Pain Goal: 2 (62/83/15 1761)  Complications: No apparent anesthesia complications

## 2016-02-25 ENCOUNTER — Encounter (HOSPITAL_COMMUNITY): Payer: Self-pay | Admitting: Neurosurgery

## 2016-02-25 MED ORDER — SENNA 8.6 MG PO TABS
1.0000 | ORAL_TABLET | Freq: Every day | ORAL | Status: DC
  Administered 2016-02-25: 8.6 mg via ORAL
  Filled 2016-02-25: qty 1

## 2016-02-25 MED ORDER — DOCUSATE SODIUM 100 MG PO CAPS
100.0000 mg | ORAL_CAPSULE | Freq: Two times a day (BID) | ORAL | Status: DC
  Administered 2016-02-25 – 2016-02-26 (×2): 100 mg via ORAL
  Filled 2016-02-25 (×2): qty 1

## 2016-02-25 MED ORDER — METHOCARBAMOL 500 MG PO TABS
500.0000 mg | ORAL_TABLET | Freq: Four times a day (QID) | ORAL | Status: DC | PRN
  Administered 2016-02-25: 500 mg via ORAL
  Filled 2016-02-25: qty 1

## 2016-02-25 NOTE — Care Management Note (Addendum)
Case Management Note  Patient Details  Name: CORLEONE BIEGLER MRN: [id number] Date of Birth: 03-27-1970  Subjective/Objective:                    Action/Plan: No f/u per PT/OT. PT recommending walker and patient states needs this for home. Larene Beach with [facility] DME notified and will have the equipment delivered to the room. Pt refused 3 in 1.   Expected Discharge Date:                  Expected Discharge Plan:  Home/Self Care  In-House Referral:     Discharge planning Services     Post Acute Care Choice:  Durable Medical Equipment Choice offered to:  Patient  DME Arranged:  [facility] DME Agency:  [place]:    [facility] Agency:     Status of Service:  Completed, signed off  If discussed at [facility] of Stay Meetings, dates discussed:    Additional Comments:  Pollie Friar, RN 02/25/2016, 3:59 PM

## 2016-02-25 NOTE — Evaluation (Signed)
Occupational Therapy Evaluation and Discharge Patient Details Name: Bradley [name] MRN: [id number] DOB: Dec 13, 1970 Today's Date: 02/25/2016    History of Present Illness Adm for Left L2-L3 anterior lateral retroperitoneal interbody decompression and fusion; Left L2, L3 posterior percutaneous pedicle screw fixation PMHx-pt reports recent Lt hand fracture and has lost his brace; twice had microdiscectomies extraforaminal area L2-3; gastric bypass; arthritis; anxiety   Clinical Impression   This 45 yo male admitted and underwent above presents to acute OT with all education completed, we will D/C from acute OT.    Follow Up Recommendations  No OT follow up    Equipment Recommendations  None recommended by OT       Precautions / Restrictions Precautions Precautions: Back Precaution Booklet Issued: Yes (comment) Precaution Comments: Educated on back precautions and by end of session could verbalize; cues to maintain Required Braces or Orthoses: Spinal Brace Spinal Brace: Lumbar corset;Applied in sitting position (per [employer] has been called to look at pt's brace (too small)) Restrictions Weight Bearing Restrictions: No      Mobility Bed Mobility Overal bed mobility: Needs Assistance Bed Mobility: Sit to Sidelying     Sit to sidelying: Min assist (for LEs)  Transfers Overall transfer level: Needs assistance Equipment used: Rolling walker (2 wheeled) Transfers: Sit to/from Stand Sit to Stand: Min guard         General transfer comment: vc for safe use of RW; slow and with effort         ADL Overall ADL's : Needs assistance/impaired Eating/Feeding: Independent;Sitting   Grooming: Set up;Sitting Grooming Details (indicate cue type and reason): Educated on brushing teeth at sink to use 2 cups (one to spit and one to rinse) to avoid bending over the sink Upper Body Bathing: Set up;Sitting   Lower Body Bathing: Moderate assistance (min guard A sit<>stand, wife  will A prn until pt can do for himselt)   Upper Body Dressing : Independent   Lower Body Dressing: Maximal assistance (minguard A sit<>stand)   Toilet Transfer: Min guard;Ambulation;RW;Comfort height toilet   Toileting- Clothing Manipulation and Hygiene: Minimal assistance (min guard A sit<>stand)                         Pertinent Vitals/Pain Pain Assessment: 0-10 Pain Score: 7  Pain Location: left side and back Pain Descriptors / Indicators: Operative site guarding Pain Intervention(s): Limited activity within patient's tolerance;Monitored during session;Repositioned     Hand Dominance Right   Extremity/Trunk Assessment Upper Extremity Assessment Upper Extremity Assessment: Overall WFL for tasks assessed     Communication Communication Communication: No difficulties   Cognition Arousal/Alertness: Awake/alert Behavior During Therapy: Flat affect Overall Cognitive Status: Within Functional Limits for tasks assessed                                Home Living Family/patient expects to be discharged to:: Private residence Living Arrangements: Spouse/significant other Available Help at Discharge: Family;Available 24 hours/day (for 3 weeks) Type of Home: House Home Access: Ramped entrance     Home Layout: One level     Bathroom Shower/Tub: Walk-in shower;Door   [employer] Toilet: Handicapped height Bathroom Accessibility: Yes   Home Equipment: Shower seat - built in;Hand held shower head;Grab bars - toilet          Prior Functioning/Environment Level of Independence: Needs assistance  Gait / Transfers Assistance Needed: occ assist getting  out of chair ADL's / Homemaking Assistance Needed: assist with socks, shoes at times                  OT Goals(Current goals can be found in the care plan section) Acute Rehab OT Goals Patient Stated Goal: go home today or tomorrow  OT Frequency:                End of Session Equipment Utilized  During Treatment: Rolling walker;Back brace  Activity Tolerance: Patient tolerated treatment well Patient left: in bed;with call bell/phone within reach;with family/visitor present   Time: 5427-0623 OT Time Calculation (min): 32 min Charges:  OT General Charges $OT Visit: 1 Procedure OT Evaluation $OT Eval Moderate Complexity: 1 Procedure OT Treatments $Self Care/Home Management : 8-22 mins  Bradley [name] [phone] 02/25/2016, 1:43 PM

## 2016-02-25 NOTE — Evaluation (Signed)
Physical Therapy Evaluation Patient Details Name: Bradley [name] MRN: [id number] DOB: 08-31-1970 Today's Date: 02/25/2016   History of Present Illness  Adm for Left L2-L3 anterior lateral retroperitoneal interbody decompression and fusion; Left L2, L3 posterior percutaneous pedicle screw fixation PMHx-pt reports recent Lt hand fracture and has lost his brace; twice had microdiscectomies extraforaminal area L2-3; gastric bypass; arthritis; anxiety    Clinical Impression  Patient is s/p above surgery resulting in the deficits listed below (see PT Problem List). Limited ambulation due to brace is too small (attempted to adjust and appears to only get smaller, not larger). RN and [employer] made aware. Patient will benefit from skilled PT to increase their independence and safety with mobility (while adhering to their precautions) to allow discharge to the venue listed below.     Follow Up Recommendations No PT follow up;Supervision for mobility/OOB    Equipment Recommendations  Rolling walker with 5" wheels (*previous surgeries borrowed equipment)    Recommendations for Other Services OT consult     Precautions / Restrictions Precautions Precautions: Back Precaution Comments: Educated on back precautions and by end of session could verbalize; cues to maintain Required Braces or Orthoses: Spinal Brace Spinal Brace: Lumbar corset (too small; [employer] called)      Mobility  Bed Mobility Overal bed mobility: Needs Assistance Bed Mobility: Rolling;Sidelying to Sit Rolling: Supervision Sidelying to sit: Min assist       General bed mobility comments: vc for technique; assist to raise torso despite use of bedrail  Transfers Overall transfer level: Needs assistance Equipment used: Rolling walker (2 wheeled) Transfers: Sit to/from Stand Sit to Stand: Min guard         General transfer comment: vc for safe use of RW; slow and with effort  Ambulation/Gait Ambulation/Gait  assistance: Min guard Ambulation Distance (Feet): 10 Feet ( around bed to chair) Assistive device: Rolling walker (2 wheeled) Gait Pattern/deviations: Decreased stride length;Step-to pattern Gait velocity: slow Gait velocity interpretation: Below normal speed for age/gender General Gait Details: encouraged leading with RLE (reports h/o buckling); no weakness noted and pt varied his sequencing; could not progress to step-through and explained progression to him  Science writer    Modified Rankin (Stroke Patients Only)       Balance                                             Pertinent Vitals/Pain Pain Assessment: 0-10 Pain Score: 8  Pain Location: left side, back Pain Descriptors / Indicators: Operative site guarding Pain Intervention(s): Limited activity within patient's tolerance;Monitored during session;Repositioned;Patient requesting pain meds-RN notified    Home Living Family/patient expects to be discharged to:: Private residence Living Arrangements: Spouse/significant other Available Help at Discharge: Family;Available 24 hours/day (for 3 weeks) Type of Home: House Home Access: Ramped entrance     Home Layout: One level Home Equipment: Shower seat - built in;Hand held shower head;Grab bars - toilet      Prior Function Level of Independence: Needs assistance   Gait / Transfers Assistance Needed: occ assist getting out of chair  ADL's / Homemaking Assistance Needed: assist with socks, shoes at times        Hand Dominance   Dominant Hand: Right    Extremity/Trunk Assessment   Upper Extremity Assessment Upper Extremity Assessment: Defer to  OT evaluation    Lower Extremity Assessment Lower Extremity Assessment: RLE deficits/detail RLE Deficits / Details: reports "bad knee" and occasionally buckles; strength grossly WFL RLE: Unable to fully assess due to pain    Cervical / Trunk Assessment Cervical /  Trunk Assessment: Normal  Communication   Communication: No difficulties  Cognition Arousal/Alertness: Awake/alert Behavior During Therapy: Flat affect Overall Cognitive Status: Within Functional Limits for tasks assessed                      General Comments General comments (skin integrity, edema, etc.): Wife present; RN working with Epic now     Exercises     Assessment/Plan    PT Assessment Patient needs continued PT services  PT Problem List Decreased activity tolerance;Decreased mobility;Decreased knowledge of use of DME;Decreased knowledge of precautions;Pain          PT Treatment Interventions DME instruction;Gait training;Functional mobility training;Therapeutic activities;Patient/family education    PT Goals (Current goals can be found in the Care Plan section)  Acute Rehab PT Goals Patient Stated Goal: go home today or tomorrow PT Goal Formulation: With patient Time For Goal Achievement: 02/27/16 Potential to Achieve Goals: Good    Frequency Min 5X/week   Barriers to discharge        Co-evaluation               End of Session Equipment Utilized During Treatment: Gait belt (back brace too small; MD note ok for OOB to chair) Activity Tolerance: Patient limited by pain Patient left: in chair;with call bell/phone within reach;with family/visitor present Nurse Communication: Mobility status;Other (comment) (brace too small; PT called [employer] & to come this pm)         Time: 3903-0092 PT Time Calculation (min) (ACUTE ONLY): 34 min   Charges:   PT Evaluation $PT Eval Low Complexity: 1 Procedure PT Treatments $Therapeutic Activity: 8-22 mins   PT G CodesJeanie Cooks Christna Kulick 03-08-2016, 1:01 PM Pager [phone]

## 2016-02-25 NOTE — Progress Notes (Signed)
PT Cancellation Note  Patient Details Name: Bradley [name] MRN: [id number] DOB: [birth year]   Cancelled Treatment:    Reason Eval/Treat Not Completed: Other (comment)  Brace has not yet arrived. Daphne, RN Clinical cytogeneticist to find out when it will arrive.  Bradley [name] 02/25/2016, 11:16 AM Pager [phone]

## 2016-02-25 NOTE — Progress Notes (Addendum)
Subjective: Patient reports "I'm having a lot of pain in my back and left leg"  Objective: Vital signs in last 24 hours: Temp:  [97 F (36.1 C)-98.6 F (37 C)] 98.1 F (36.7 C) (12/22 0522) Pulse Rate:  [61-99] 94 (12/22 0522) Resp:  [10-20] 18 (12/22 0522) BP: (132-182)/(86-103) 151/86 (12/22 0522) SpO2:  [98 %-100 %] 98 % (12/22 0522) Weight:  [102.1 kg (225 lb)-107.5 kg (236 lb 15.9 oz)] 107.5 kg (236 lb 15.9 oz) (12/21 1944)  Intake/Output from previous day: 12/21 0701 - 12/22 0700 In: 2070 [P.O.:240; I.V.:1780; IV Piggyback:50] Out: 8208 [Urine:3700; Blood:50] Intake/Output this shift: No intake/output data recorded.  Alert, conversant. Wife present. Not yet OOB as LSO has not been delivered. Lumbar and left leg pain has prompted repeated Norco & Dilaudid requests last several hours. Exam reveals good strength BLE, LLE limited by pain. Moderate hematoma/soft mass beneath left lumbar incision. Lateral incisions flat. No erythema or active drainage at any incision.   Lab Results: No results for input(s): WBC, HGB, HCT, PLT in the last 72 hours. BMET No results for input(s): NA, K, CL, CO2, GLUCOSE, BUN, CREATININE, CALCIUM in the last 72 hours.  Studies/Results: Dg Lumbar Spine 2-3 Views  Result Date: 02/24/2016 CLINICAL DATA:  Fusion. EXAM: LUMBAR SPINE - 2-3 VIEW; DG C-ARM 61-120 MIN COMPARISON:  No recent prior. FINDINGS: Mid lumbar posterior interbody fusion. Hardware intact. Lumbar vertebra difficult to number due to positioning. Two images obtained. 3 minutes 5 seconds fluoroscopy time. IMPRESSION: Postsurgical changes mid lumbar spine. Electronically Signed   By: Marcello Moores  Register   On: 02/24/2016 15:12   Dg C-arm 1-60 Min  Result Date: 02/24/2016 CLINICAL DATA:  Fusion. EXAM: LUMBAR SPINE - 2-3 VIEW; DG C-ARM 61-120 MIN COMPARISON:  No recent prior. FINDINGS: Mid lumbar posterior interbody fusion. Hardware intact. Lumbar vertebra difficult to number due to positioning.  Two images obtained. 3 minutes 5 seconds fluoroscopy time. IMPRESSION: Postsurgical changes mid lumbar spine. Electronically Signed   By: Marcello Moores  Register   On: 02/24/2016 15:12    Assessment/Plan:   LOS: 1 day  Per DrStern, may use Robaxin 528m po up to qid instead of Valium while mobilizing with PT. (Valium may stay as prn if no relief from Robaxin.)  Ok to be up to chair or BSC without LSO. Cold pack to lumbar incision for comfort/swelling. Hopeful for LSO arrival so PT will not be delayed. Pt agrees to try Dilaudid today, without addition of Norco, transitioning to Norco as pain subsides.    PReagan, Klemz12/22/2017, 7:58 AM  Patient to mobilize in brace with PT.

## 2016-02-26 MED ORDER — HYDROCODONE-ACETAMINOPHEN 10-325 MG PO TABS: 1.0000 | ORAL_TABLET | Freq: Four times a day (QID) | ORAL | 0 refills | Status: DC | PRN

## 2016-02-26 NOTE — Discharge Instructions (Signed)
No lifting no bending no twistg no driving

## 2016-02-26 NOTE — Discharge Summary (Signed)
  Physician Discharge Summary  Patient ID: Bradley [name] MRN: [id number] DOB/AGE: 47/24/1972 47 y.o.  Admit date: 02/24/2016 Discharge date: 02/26/2016  Admission Diagnoses:degenerative disc spinal stenosis L2-3  Discharge Diagnoses: same Active Problems:   Degenerative spondylolisthesis   Discharged Condition: good  Hospital Course: patient was admitted  To the hospital underwent anterior lateral interbody fusion at L2-3. Post operatively patient did fairly well and by postoperative day 3 was stable for discharge home scheduled follow-up in 2 weeks  Consults: Significant Diagnostic Studies: Treatments: Discharge Exam: Blood pressure 133/84, pulse 88, temperature 97.7 F (36.5 C), temperature source Oral, resp. rate 20, height 5' 10"  (1.778 m), weight 107.5 kg (236 lb 15.9 oz), SpO2 98 %. Awake alert strength 5 out of 5  Disposition: home  Discharge Instructions    For home use only DME 4 wheeled rolling walker with seat    Complete by:  As directed    Patient needs a walker to treat with the following condition:  Spinal stenosis     Allergies as of 02/26/2016      Reactions   Codeine Hives   Hives and itching   Percocet [oxycodone-acetaminophen] Hives   Hives and itching      Medication List    TAKE these medications   acetaminophen 500 MG tablet Commonly known as:  TYLENOL Take 1,000 mg by mouth every 6 (six) hours as needed for mild pain. Reported on 07/14/2015   ferrous sulfate 325 (65 FE) MG tablet Take 1 tablet (325 mg total) by mouth 3 (three) times daily with meals.   HYDROcodone-acetaminophen 10-325 MG tablet Commonly known as:  NORCO Take 1 tablet by mouth every 6 (six) hours as needed for moderate pain. What changed:  Another medication with the same name was added. Make sure you understand how and when to take each.   HYDROcodone-acetaminophen 10-325 MG tablet Commonly known as:  NORCO Take 1 tablet by mouth every 6 (six) hours as needed. What  changed:  You were already taking a medication with the same name, and this prescription was added. Make sure you understand how and when to take each.   multivitamin with minerals Tabs tablet Take 1 tablet by mouth 3 (three) times daily. Reported on 06/08/2015   nitrofurantoin (macrocrystal-monohydrate) 100 MG capsule Commonly known as:  MACROBID Take 1 capsule (100 mg total) by mouth 2 (two) times daily. Take with food            Durable Medical Equipment        Start     Ordered   02/26/16 0000  For home use only DME 4 wheeled rolling walker with seat    Question:  Patient needs a walker to treat with the following condition  Answer:  Spinal stenosis   02/26/16 0828   02/24/16 1945  DME Walker rolling  Once    Question:  Patient needs a walker to treat with the following condition  Answer:  Degenerative spondylolisthesis   02/24/16 1944   02/24/16 1945  DME 3 n 1  Once     02/24/16 1944       Signed: Klyde Banka P 02/26/2016, 8:28 AM

## 2016-02-26 NOTE — Progress Notes (Signed)
Physical Therapy Treatment Patient Details Name: Bradley [name] MRN: [id number] DOB: 1970-06-19 Today's Date: 02/26/2016    History of Present Illness Adm for Left L2-L3 anterior lateral retroperitoneal interbody decompression and fusion; Left L2, L3 posterior percutaneous pedicle screw fixation PMHx-pt reports recent Lt hand fracture and has lost his brace; twice had microdiscectomies extraforaminal area L2-3; gastric bypass; arthritis; anxiety    PT Comments    Plan is for d/c home today. Pt at supervision level for all functional mobility.   Follow Up Recommendations  No PT follow up;Supervision for mobility/OOB     Equipment Recommendations  Rolling walker with 5" wheels    Recommendations for Other Services       Precautions / Restrictions Precautions Precautions: Back Precaution Comments: Reviewed 3/3 back precautions. Pt independently recalled 1/3. Wife present in room and recalls 3/3. Required Braces or Orthoses: Spinal Brace Spinal Brace: Lumbar corset;Applied in sitting position Restrictions Other Position/Activity Restrictions: Brace sizing corrected yesterday, 12/22.    Mobility  Bed Mobility               General bed mobility comments: Pt up in recliner upon arrival. Reports performing bed mobility mod I.  Transfers   Equipment used: Rolling walker (2 wheeled)   Sit to Stand: Supervision         General transfer comment: slow and with effort. Good stability noted.  Ambulation/Gait Ambulation/Gait assistance: Supervision Ambulation Distance (Feet): 200 Feet Assistive device: Rolling walker (2 wheeled) Gait Pattern/deviations: Step-through pattern;Decreased stride length Gait velocity: slow Gait velocity interpretation: Below normal speed for age/gender General Gait Details: verbal cues for posture   Stairs            Wheelchair Mobility    Modified Rankin (Stroke Patients Only)       Balance                                     Cognition Arousal/Alertness: Awake/alert Behavior During Therapy: Flat affect Overall Cognitive Status: Within Functional Limits for tasks assessed                      Exercises      General Comments        Pertinent Vitals/Pain Pain Assessment: 0-10 Pain Score: 5  Pain Descriptors / Indicators: Sore Pain Intervention(s): Monitored during session    Home Living                      Prior Function            PT Goals (current goals can now be found in the care plan section) Acute Rehab PT Goals Patient Stated Goal: home today PT Goal Formulation: With patient Time For Goal Achievement: 02/27/16 Potential to Achieve Goals: Good Progress towards PT goals: Progressing toward goals    Frequency    Min 5X/week      PT Plan Current plan remains appropriate    Co-evaluation             End of Session Equipment Utilized During Treatment: Back brace;Gait belt Activity Tolerance: Patient tolerated treatment well Patient left: in chair;with call bell/phone within reach;with family/visitor present     Time: 1004-1017 PT Time Calculation (min) (ACUTE ONLY): 13 min  Charges:  $Gait Training: 8-22 mins  G Codes:      Lorriane Shire 02/26/2016, 10:43 AM

## 2016-02-26 NOTE — Progress Notes (Signed)
Patient ready for discharge to home; discharge instructions given and reviewed; Rx given; patient discharged wearing his back support brace via wheelchair accompanied by his wife.

## 2016-03-01 ENCOUNTER — Ambulatory Visit: Payer: Managed Care, Other (non HMO) | Admitting: Oncology

## 2016-03-01 ENCOUNTER — Other Ambulatory Visit: Payer: Managed Care, Other (non HMO)

## 2016-03-02 ENCOUNTER — Other Ambulatory Visit: Payer: Self-pay | Admitting: Internal Medicine

## 2016-03-02 DIAGNOSIS — E559 Vitamin D deficiency, unspecified: Secondary | ICD-10-CM

## 2016-03-09 ENCOUNTER — Other Ambulatory Visit: Payer: Self-pay | Admitting: *Deleted

## 2016-03-09 ENCOUNTER — Encounter: Payer: Self-pay | Admitting: *Deleted

## 2016-03-09 NOTE — Patient Outreach (Addendum)
Bradley [name]) [name] Management  03/09/2016  Bradley [name] [birth year] [id number]   Subjective: Telephone call to patient's home / mobile number, spoke with patient, and HIPAA verified.  Discussed [name] [employer] Transition of [name] follow up, patient voiced understanding , and is in agreement to complete follow up. Patient states today has not been a good day and is very frustrated with decreased pain management due to the new opioid laws.  RNCM educated patient on pain management strategies and how to discuss his concerns with providers (primary MD, surgeon, and pain management specialist).   Patient voices understanding of education, is aware of why opioid laws have changed, and states he will call providers to discuss his concerns.  Patient declined RNCM assistance at this time with communicating with providers and will notify RNCM if assistance needed in the future.   Patient states had [name] follow up with surgeon on 03/07/16 and will call primary MD to set up follow up appointment.   States he has a follow up appointment with pain management specialist every 3 months and will call specialist to advise of recent surgery and schedule earlier month.  Patient states he is currently working with attorney to obtain disability and is aware of the timeframe for a decision.   States he has been working with attorney for for approximately 2 years.  Patient states he does not have any transition of [name], [name] coordination, disease management, disease monitoring, transportation, community resource, or pharmacy needs at this time.  States he is very appreciative of the follow up call and is in agreement to receive [facility] information.  Objective: Per chart and [employer] review, patient hospitalized 02/24/16 - 02/26/16 for spinal stenosis.   Status post Left L2-L3 anterior lateral retroperitoneal interbody decompression and fusion utilizing interbody peek cage and  morselized allograft.  Left L2, L3 posterior percutaneous pedicle screw fixation on 02/24/16.   Assessment: Received [employer] Transition of [name] referral on 03/09/16.   Transition of [name] follow up completed, no [name] management needs, and will proceed with case closure.  Plan: RNCM will send patient successful outreach letter, Bradley [name] pamphlet, and magnet. RNCM will send case closure due to follow up completed / no [name] management needs request to Bradley [name] at [facility].   Bradley [name] H. Annia Friendly, BSN, [facility] Bradley [name] Telephonic CM Phone: [phone] Fax: [phone]

## 2016-03-22 ENCOUNTER — Ambulatory Visit: Payer: Managed Care, Other (non HMO) | Admitting: Oncology

## 2016-03-22 ENCOUNTER — Other Ambulatory Visit: Payer: Managed Care, Other (non HMO)

## 2016-03-23 ENCOUNTER — Telehealth: Payer: Self-pay | Admitting: Oncology

## 2016-03-23 NOTE — Telephone Encounter (Signed)
Lvm advising appt 2/23 @ 2.30 r/s'd from 1/17 cx'd appt.

## 2016-04-28 ENCOUNTER — Other Ambulatory Visit (HOSPITAL_BASED_OUTPATIENT_CLINIC_OR_DEPARTMENT_OTHER): Payer: Managed Care, Other (non HMO)

## 2016-04-28 ENCOUNTER — Ambulatory Visit (HOSPITAL_BASED_OUTPATIENT_CLINIC_OR_DEPARTMENT_OTHER): Payer: Managed Care, Other (non HMO) | Admitting: Oncology

## 2016-04-28 VITALS — BP 152/93 | HR 73 | Temp 98.4°F | Resp 18 | Ht 70.0 in | Wt 228.1 lb

## 2016-04-28 DIAGNOSIS — D509 Iron deficiency anemia, unspecified: Secondary | ICD-10-CM | POA: Diagnosis not present

## 2016-04-28 LAB — CBC WITH DIFFERENTIAL/PLATELET
BASO%: 0.2 % (ref 0.0–2.0)
Basophils Absolute: 0 10*3/uL (ref 0.0–0.1)
EOS%: 1 % (ref 0.0–7.0)
Eosinophils Absolute: 0.1 10*3/uL (ref 0.0–0.5)
HCT: 39.7 % (ref 38.4–49.9)
HGB: 12.9 g/dL — ABNORMAL LOW (ref 13.0–17.1)
LYMPH%: 38.4 % (ref 14.0–49.0)
MCH: 27.3 pg (ref 27.2–33.4)
MCHC: 32.5 g/dL (ref 32.0–36.0)
MCV: 84 fL (ref 79.3–98.0)
MONO#: 0.4 10*3/uL (ref 0.1–0.9)
MONO%: 7.2 % (ref 0.0–14.0)
NEUT#: 3.3 10*3/uL (ref 1.5–6.5)
NEUT%: 53.2 % (ref 39.0–75.0)
Platelets: 218 10*3/uL (ref 140–400)
RBC: 4.73 10*6/uL (ref 4.20–5.82)
RDW: 14.3 % (ref 11.0–14.6)
WBC: 6.1 10*3/uL (ref 4.0–10.3)
lymph#: 2.4 10*3/uL (ref 0.9–3.3)

## 2016-04-28 LAB — IRON AND TIBC
%SAT: 16 % — ABNORMAL LOW (ref 20–55)
Iron: 57 ug/dL (ref 42–163)
TIBC: 346 ug/dL (ref 202–409)
UIBC: 289 ug/dL (ref 117–376)

## 2016-04-28 LAB — FERRITIN: Ferritin: 13 ng/ml — ABNORMAL LOW (ref 22–316)

## 2016-04-28 NOTE — Progress Notes (Signed)
Hematology and Oncology Follow Up Visit  Bradley [name] [id number] 1970-08-10 46 y.o. 04/28/2016 2:47 PM [name], MDJones, Arvid Right, MD   Principle Diagnosis: 46 year old with Iron deficiency anemia that has been documented intermittently since 2011. In April 2017 he was found to have hemoglobin of 7.6, iron level of 9 indicating severe iron deficiency. He has no signs of bleeding and this is related to poor iron absorption from previous gastric bypass operation.   Prior Therapy: IV iron for a total of 1000 mg given intravenously in April 2017. Was repeated in October 2017.  Current therapy: Oral iron replacements on a daily basis.  Interim History: Mr. Lewallen presents today for a follow-up visit. Since the last visit, he underwent laminectomy of L2-3 completed on 02/24/2016. He tolerated the procedure well without any delayed complications. He is still recovering slowly at this time. He still have lower back pain as well as hip pain as he is ambulating without major difficulties. He is able to drive and attends to activities of daily living.  He did not sustain any major blood loss during the operation and did not require any transfusions. He denied any hematochezia or melena. He continues to take oral iron without any issues at this time. Had any constipation or diarrhea. Abdomen excessive fatigue or tiredness.  He does not report any headaches, blurry vision, syncope or seizures. He does not report any fevers or chills or sweats. Does not report any cough, wheezing or hemoptysis. Does not report any nausea, vomiting or abdominal pain. Does not report any frequency urgency or hesitancy. Does not report any skeletal complaints other than his back pain. Remaining review of systems unremarkable.   Medications: I have reviewed the patient's current medications.  Current Outpatient Prescriptions  Medication Sig Dispense Refill  . acetaminophen (TYLENOL) 500 MG tablet Take 1,000 mg by mouth  every 6 (six) hours as needed for mild pain. Reported on 07/14/2015    . DIALYVITE VITAMIN D3 MAX 60109 units TABS TAKE 1 TABLET BY MOUTH 1 TIME A WEEK 12 tablet 3  . ferrous sulfate 325 (65 FE) MG tablet Take 1 tablet (325 mg total) by mouth 3 (three) times daily with meals. 90 tablet 11  . HYDROcodone-acetaminophen (NORCO) 10-325 MG tablet Take 1 tablet by mouth every 6 (six) hours as needed for moderate pain.     Marland Kitchen HYDROcodone-acetaminophen (NORCO) 10-325 MG tablet Take 1 tablet by mouth every 6 (six) hours as needed. 60 tablet 0  . Multiple Vitamin (MULTIVITAMIN WITH MINERALS) TABS tablet Take 1 tablet by mouth 3 (three) times daily. Reported on 06/08/2015    . nitrofurantoin, macrocrystal-monohydrate, (MACROBID) 100 MG capsule Take 1 capsule (100 mg total) by mouth 2 (two) times daily. Take with food 20 capsule 0   No current facility-administered medications for this visit.      Allergies:  Allergies  Allergen Reactions  . Codeine Hives    Hives and itching  . Percocet [Oxycodone-Acetaminophen] Hives    Hives and itching    Past Medical History, Surgical history, Social history, and Family History were reviewed and updated.   Physical Exam: Blood pressure (!) 152/93, pulse 73, temperature 98.4 F (36.9 C), temperature source Oral, resp. rate 18, height 5' 10"  (1.778 m), weight 228 lb 1.6 oz (103.5 kg), SpO2 100 %. ECOG: 0 General appearance: alert and cooperative appeared without distress. Head: Normocephalic, without obvious abnormality without oral ulcers or lesions. Neck: no adenopathy Lymph nodes: Cervical, supraclavicular, and axillary nodes  normal. Heart:regular rate and rhythm, S1, S2 normal, no murmur, click, rub or gallop Lung:chest clear, no wheezing, rales, normal symmetric air entry Abdomin: soft, non-tender, without masses or organomegaly EXT:no erythema, induration, or nodules   Lab Results: Lab Results  Component Value Date   WBC 6.1 04/28/2016   HGB 12.9  (L) 04/28/2016   HCT 39.7 04/28/2016   MCV 84.0 04/28/2016   PLT 218 04/28/2016     Chemistry      Component Value Date/Time   NA 138 02/21/2016 0912   K 4.0 02/21/2016 0912   CL 107 02/21/2016 0912   CO2 21 (L) 02/21/2016 0912   BUN 11 02/21/2016 0912   CREATININE 0.65 02/21/2016 0912   CREATININE 0.69 09/29/2014 1436      Component Value Date/Time   CALCIUM 8.5 (L) 02/21/2016 0912   CALCIUM 8.7 10/05/2009 2249   ALKPHOS 82 02/10/2016 1145   AST 46 (H) 02/10/2016 1145   ALT 47 02/10/2016 1145   BILITOT 0.4 02/10/2016 1145      Impression and Plan:  46 year old gentleman with the following issues:  1. Iron deficiency anemia that has been documented intermittently since 2011. His iron deficiency is related to chronic blood losses related to gastric bypass operation.  He is currently on oral iron and have tolerated it reasonably well. He has required intravenous iron in the past but his iron stores appears fully repleted at this time.  I recommended continuing oral iron at this time and use intravenous iron for the future only as needed..  2. Follow-up: I am happy to see him in the future as needed if his iron levels decrease further in the future.   Zola Button, MD 2/23/20182:47 PM

## 2016-08-10 ENCOUNTER — Other Ambulatory Visit: Payer: Self-pay | Admitting: Internal Medicine

## 2016-08-10 ENCOUNTER — Telehealth: Payer: Self-pay | Admitting: Internal Medicine

## 2016-08-10 DIAGNOSIS — T753XXA Motion sickness, initial encounter: Secondary | ICD-10-CM

## 2016-08-10 MED ORDER — SCOPOLAMINE 1 MG/3DAYS TD PT72: 1.0000 | MEDICATED_PATCH | TRANSDERMAL | 0 refills | Status: DC

## 2016-08-10 NOTE — Telephone Encounter (Signed)
Pt is leaving for a cruise on Friday and would like a prescription for a motion sickness patch sent to the [facility].

## 2016-08-15 NOTE — Telephone Encounter (Signed)
Pt may have been informed already but rx rq has been sent in.

## 2016-09-05 ENCOUNTER — Ambulatory Visit (INDEPENDENT_AMBULATORY_CARE_PROVIDER_SITE_OTHER): Payer: BLUE CROSS/BLUE SHIELD | Admitting: Internal Medicine

## 2016-09-05 ENCOUNTER — Other Ambulatory Visit (INDEPENDENT_AMBULATORY_CARE_PROVIDER_SITE_OTHER): Payer: BLUE CROSS/BLUE SHIELD

## 2016-09-05 ENCOUNTER — Encounter: Payer: Self-pay | Admitting: Internal Medicine

## 2016-09-05 VITALS — BP 138/88 | HR 67 | Temp 98.0°F | Resp 16 | Ht 70.0 in | Wt 233.0 lb

## 2016-09-05 DIAGNOSIS — M1711 Unilateral primary osteoarthritis, right knee: Secondary | ICD-10-CM

## 2016-09-05 DIAGNOSIS — Z0000 Encounter for general adult medical examination without abnormal findings: Secondary | ICD-10-CM

## 2016-09-05 DIAGNOSIS — D508 Other iron deficiency anemias: Secondary | ICD-10-CM

## 2016-09-05 DIAGNOSIS — Z9884 Bariatric surgery status: Secondary | ICD-10-CM

## 2016-09-05 DIAGNOSIS — I10 Essential (primary) hypertension: Secondary | ICD-10-CM | POA: Diagnosis not present

## 2016-09-05 DIAGNOSIS — Z23 Encounter for immunization: Secondary | ICD-10-CM | POA: Diagnosis not present

## 2016-09-05 DIAGNOSIS — R7989 Other specified abnormal findings of blood chemistry: Secondary | ICD-10-CM | POA: Diagnosis not present

## 2016-09-05 DIAGNOSIS — D519 Vitamin B12 deficiency anemia, unspecified: Secondary | ICD-10-CM | POA: Diagnosis not present

## 2016-09-05 DIAGNOSIS — R945 Abnormal results of liver function studies: Secondary | ICD-10-CM

## 2016-09-05 DIAGNOSIS — E60 Dietary zinc deficiency: Secondary | ICD-10-CM | POA: Diagnosis not present

## 2016-09-05 DIAGNOSIS — E559 Vitamin D deficiency, unspecified: Secondary | ICD-10-CM

## 2016-09-05 LAB — COMPREHENSIVE METABOLIC PANEL
ALT: 16 U/L (ref 0–53)
AST: 15 U/L (ref 0–37)
Albumin: 4 g/dL (ref 3.5–5.2)
Alkaline Phosphatase: 75 U/L (ref 39–117)
BUN: 14 mg/dL (ref 6–23)
CO2: 29 mEq/L (ref 19–32)
Calcium: 9 mg/dL (ref 8.4–10.5)
Chloride: 103 mEq/L (ref 96–112)
Creatinine, Ser: 1 mg/dL (ref 0.40–1.50)
GFR: 84.78 mL/min (ref >60.00)
Glucose, Bld: 102 mg/dL — ABNORMAL HIGH (ref 70–99)
Potassium: 4.8 mEq/L (ref 3.5–5.1)
Sodium: 136 mEq/L (ref 135–145)
Total Bilirubin: 0.5 mg/dL (ref 0.2–1.2)
Total Protein: 7.2 g/dL (ref 6.0–8.3)

## 2016-09-05 LAB — CBC WITH DIFFERENTIAL/PLATELET
Basophils Absolute: 0 10*3/uL (ref 0.0–0.1)
Basophils Relative: 0.5 % (ref 0.0–3.0)
Eosinophils Absolute: 0.1 10*3/uL (ref 0.0–0.7)
Eosinophils Relative: 2.3 % (ref 0.0–5.0)
HCT: 40.2 % (ref 39.0–52.0)
Hemoglobin: 13.3 g/dL (ref 13.0–17.0)
Lymphocytes Relative: 40.1 % (ref 12.0–46.0)
Lymphs Abs: 2.6 10*3/uL (ref 0.7–4.0)
MCHC: 33.2 g/dL (ref 30.0–36.0)
MCV: 83 fl (ref 78.0–100.0)
Monocytes Absolute: 0.7 10*3/uL (ref 0.1–1.0)
Monocytes Relative: 10.2 % (ref 3.0–12.0)
Neutro Abs: 3 10*3/uL (ref 1.4–7.7)
Neutrophils Relative %: 46.9 % (ref 43.0–77.0)
Platelets: 266 10*3/uL (ref 150.0–400.0)
RBC: 4.84 Mil/uL (ref 4.22–5.81)
RDW: 14.1 % (ref 11.5–15.5)
WBC: 6.5 10*3/uL (ref 4.0–10.5)

## 2016-09-05 LAB — HEPATITIS C ANTIBODY: HCV Ab: NEGATIVE

## 2016-09-05 LAB — LIPID PANEL
Cholesterol: 137 mg/dL (ref 0–200)
HDL: 43.9 mg/dL (ref >39.00)
LDL Cholesterol: 67 mg/dL (ref 0–99)
NonHDL: 93.1
Total CHOL/HDL Ratio: 3
Triglycerides: 133 mg/dL (ref 0.0–149.0)
VLDL: 26.6 mg/dL (ref 0.0–40.0)

## 2016-09-05 LAB — IBC PANEL
Iron: 46 ug/dL (ref 42–165)
Saturation Ratios: 10.2 % — ABNORMAL LOW (ref 20.0–50.0)
Transferrin: 323 mg/dL (ref 212.0–360.0)

## 2016-09-05 LAB — VITAMIN D 25 HYDROXY (VIT D DEFICIENCY, FRACTURES): VITD: 27.93 ng/mL — ABNORMAL LOW (ref 30.00–100.00)

## 2016-09-05 LAB — HEPATITIS B SURFACE ANTIBODY,QUALITATIVE: Hep B S Ab: NEGATIVE

## 2016-09-05 LAB — VITAMIN B12: Vitamin B-12: 234 pg/mL (ref 211–911)

## 2016-09-05 LAB — HEPATITIS A ANTIBODY, TOTAL: Hep A Total Ab: NONREACTIVE

## 2016-09-05 LAB — FERRITIN: Ferritin: 13.4 ng/mL — ABNORMAL LOW (ref 22.0–322.0)

## 2016-09-05 LAB — HEPATITIS B CORE ANTIBODY, TOTAL: Hep B Core Total Ab: NONREACTIVE

## 2016-09-05 LAB — TSH: TSH: 1.54 u[IU]/mL (ref 0.35–4.50)

## 2016-09-05 LAB — FOLATE: Folate: 7.7 ng/mL (ref >5.9)

## 2016-09-05 MED ORDER — CHOLECALCIFEROL 50 MCG (2000 UT) PO TABS: 1.0000 | ORAL_TABLET | Freq: Every day | ORAL | 3 refills | Status: DC

## 2016-09-05 NOTE — Progress Notes (Signed)
Subjective:  Patient ID: Bradley [name], male    DOB: 02-22-1971  Age: 46 y.o. MRN: [id number]  CC: Osteoarthritis; Anemia; and Annual Exam   HPI [name] presents for a CPX.   He is not compliant with vitamin replacement therapy as it relates to his history of gastric bypass, chronic anemia, and multiple vitamin deficiencies.  He complains of chronic, worsening right knee pain and swelling and wants to see an orthopedist to see if he is a candidate for total knee replacement. He is taking Percocet for pain relief.  He has a history of mildly elevated liver enzymes. He rarely consumes alcohol and takes a low dose of acetaminophen. He doesn't know if he is ever been vaccinated against hepatitis.  Outpatient Medications Prior to Visit  Medication Sig Dispense Refill  . acetaminophen (TYLENOL) 500 MG tablet Take 1,000 mg by mouth every 6 (six) hours as needed for mild pain. Reported on 07/14/2015    . DIALYVITE VITAMIN D3 MAX 92010 units TABS TAKE 1 TABLET BY MOUTH 1 TIME A WEEK 12 tablet 3  . ferrous sulfate 325 (65 FE) MG tablet Take 1 tablet (325 mg total) by mouth 3 (three) times daily with meals. 90 tablet 11  . Multiple Vitamin (MULTIVITAMIN WITH MINERALS) TABS tablet Take 1 tablet by mouth 3 (three) times daily. Reported on 06/08/2015    . scopolamine (TRANSDERM-SCOP, 1.5 MG,) 1 MG/3DAYS Place 1 patch (1.5 mg total) onto the skin every 3 (three) days. 10 patch 0   No facility-administered medications prior to visit.     ROS Review of Systems  Constitutional: Negative for appetite change, chills, fatigue and unexpected weight change.  HENT: Negative.  Negative for trouble swallowing and voice change.   Eyes: Negative.  Negative for visual disturbance.  Respiratory: Negative for cough, chest tightness, shortness of breath and wheezing.   Cardiovascular: Negative for chest pain, palpitations and leg swelling.  Gastrointestinal: Negative.  Negative for abdominal pain,  constipation, diarrhea, nausea and vomiting.  Endocrine: Negative.   Genitourinary: Negative.  Negative for difficulty urinating, penile swelling, scrotal swelling and testicular pain.  Musculoskeletal: Positive for arthralgias. Negative for myalgias and neck pain.  Skin: Negative.  Negative for color change and rash.  Allergic/Immunologic: Negative.   Neurological: Negative.  Negative for dizziness, weakness, numbness and headaches.  Hematological: Negative for adenopathy. Does not bruise/bleed easily.  Psychiatric/Behavioral: Negative.     Objective:  BP 138/88 (BP Location: Left Arm, Patient Position: Sitting, Cuff Size: Normal)   Pulse 67   Temp 98 F (36.7 C) (Oral)   Resp 16   Ht 5' 10"  (1.778 m)   Wt 233 lb (105.7 kg)   SpO2 98%   BMI 33.43 kg/m   BP Readings from Last 3 Encounters:  09/05/16 138/88  04/28/16 (!) 152/93  02/26/16 133/84    Wt Readings from Last 3 Encounters:  09/05/16 233 lb (105.7 kg)  04/28/16 228 lb 1.6 oz (103.5 kg)  02/24/16 236 lb 15.9 oz (107.5 kg)    Physical Exam  Constitutional: He is oriented to person, place, and time. No distress.  HENT:  Mouth/Throat: Oropharynx is clear and moist. No oropharyngeal exudate.  Eyes: Conjunctivae are normal. Right eye exhibits no discharge. Left eye exhibits no discharge. No scleral icterus.  Neck: Normal range of motion. Neck supple. No JVD present. No thyromegaly present.  Cardiovascular: Regular rhythm and intact distal pulses.  Exam reveals no gallop and no friction rub.   No murmur  heard. Pulmonary/Chest: Effort normal. No respiratory distress. He has no wheezes. He has no rales. He exhibits no tenderness.  Abdominal: Soft. Bowel sounds are normal. He exhibits no distension and no mass. There is no tenderness. There is no rebound and no guarding.  Musculoskeletal: Normal range of motion. He exhibits no edema or tenderness.       Right knee: He exhibits effusion and deformity (DJD). He exhibits  normal range of motion, no swelling, no erythema and no bony tenderness. No tenderness found.  Lymphadenopathy:    He has no cervical adenopathy.  Neurological: He is alert and oriented to person, place, and time.  Skin: Skin is warm and dry. No rash noted. He is not diaphoretic. No erythema. No pallor.  Psychiatric: He has a normal mood and affect. His behavior is normal. Thought content normal.  Vitals reviewed.   Lab Results  Component Value Date   WBC 6.5 09/05/2016   HGB 13.3 09/05/2016   HCT 40.2 09/05/2016   PLT 266.0 09/05/2016   GLUCOSE 102 (H) 09/05/2016   CHOL 137 09/05/2016   TRIG 133.0 09/05/2016   HDL 43.90 09/05/2016   LDLCALC 67 09/05/2016   ALT 16 09/05/2016   AST 15 09/05/2016   NA 136 09/05/2016   K 4.8 09/05/2016   CL 103 09/05/2016   CREATININE 1.00 09/05/2016   BUN 14 09/05/2016   CO2 29 09/05/2016   TSH 1.54 09/05/2016   HGBA1C 4.9 10/05/2009    No results found.  Assessment & Plan:   Nickolus was seen today for osteoarthritis, anemia and annual exam.  Diagnoses and all orders for this visit:  Other iron deficiency anemia- his H&H are normal now but his ferritin level is low and his iron level is trending down some of asked him to restart iron replacement therapy. -     CBC with Differential/Platelet; Future -     IBC panel; Future -     Ferritin; Future -     ferrous sulfate 325 (65 FE) MG tablet; Take 1 tablet (325 mg total) by mouth daily with breakfast.  Vitamin D deficiency- he is still deficient in vitamin D's of asked him to restart vitamin D replacement therapy. -     VITAMIN D 25 Hydroxy (Vit-D Deficiency, Fractures); Future -     Cholecalciferol 2000 units TABS; Take 1 tablet (2,000 Units total) by mouth daily.  Zinc deficiency- he is still deficient in [place] so I have asked him to restart zinc replacement therapy -     Zinc; Future -     zinc gluconate 50 MG tablet; Take 1 tablet (50 mg total) by mouth daily.  Anemia due to vitamin  B12 deficiency, unspecified B12 deficiency type -     CBC with Differential/Platelet; Future -     Vitamin B12; Future  Essential hypertension- his blood pressure i adequately well-controlled with no medical therapy.  Gastric bypass status for obesity- will screen for vitamin deficiencies. -     IBC panel; Future -     Vitamin B12; Future -     Ferritin; Future -     Folate; Future -     Vitamin B1; Future -     VITAMIN D 25 Hydroxy (Vit-D Deficiency, Fractures); Future -     Zinc; Future -     Cholecalciferol 2000 units TABS; Take 1 tablet (2,000 Units total) by mouth daily. -     zinc gluconate 50 MG tablet; Take 1 tablet (50 mg  total) by mouth daily. -     ferrous sulfate 325 (65 FE) MG tablet; Take 1 tablet (325 mg total) by mouth daily with breakfast.  Routine general medical examination at a health care facility- exam completed, labs ordered and reviewed, vaccines reviewed and updated, patient education material was given. -     Lipid panel; Future -     TSH; Future  Elevated LFTs- his liver enzymes are normal now and screening for Hep A/B/C infection are negative. I've asked him to return to start the vaccine process for hepatitis A and B. -     Comprehensive metabolic panel; Future -     Hepatitis A antibody, total; Future -     Hepatitis B core antibody, total; Future -     Hepatitis B surface antibody; Future -     Hepatitis C antibody; Future  Primary osteoarthritis of right knee -     Ambulatory referral to Orthopedic Surgery  Need for prophylactic vaccination against diphtheria-tetanus-pertussis (DTP) -     Tdap vaccine greater than or equal to 7yo IM   I have discontinued Mr. Lichty multivitamin with minerals, acetaminophen, DIALYVITE VITAMIN D3 MAX, and scopolamine. I have also changed his ferrous sulfate. Additionally, I am having him start on Cholecalciferol and zinc gluconate. Lastly, I am having him maintain his oxyCODONE-acetaminophen.  Meds ordered this  encounter  Medications  . oxyCODONE-acetaminophen (PERCOCET) 7.5-325 MG tablet    Sig: Take 1 tablet by mouth every 4 (four) hours as needed for severe pain.  . Cholecalciferol 2000 units TABS    Sig: Take 1 tablet (2,000 Units total) by mouth daily.    Dispense:  90 tablet    Refill:  3  . zinc gluconate 50 MG tablet    Sig: Take 1 tablet (50 mg total) by mouth daily.    Dispense:  90 tablet    Refill:  3  . ferrous sulfate 325 (65 FE) MG tablet    Sig: Take 1 tablet (325 mg total) by mouth daily with breakfast.    Dispense:  90 tablet    Refill:  3     Follow-up: Return in about 3 months (around 12/06/2016).  [name], MD

## 2016-09-05 NOTE — Patient Instructions (Signed)
Anemia, Nonspecific Anemia is a condition in which the concentration of red blood cells or hemoglobin in the blood is below normal. Hemoglobin is a substance in red blood cells that carries oxygen to the tissues of the body. Anemia results in not enough oxygen reaching these tissues. What are the causes? Common causes of anemia include:  Excessive bleeding. Bleeding may be internal or external. This includes excessive bleeding from periods (in women) or from the intestine.  Poor nutrition.  Chronic kidney, thyroid, and liver disease.  Bone marrow disorders that decrease red blood cell production.  Cancer and treatments for cancer.  HIV, AIDS, and their treatments.  Spleen problems that increase red blood cell destruction.  Blood disorders.  Excess destruction of red blood cells due to infection, medicines, and autoimmune disorders. What are the signs or symptoms?  Minor weakness.  Dizziness.  Headache.  Palpitations.  Shortness of breath, especially with exercise.  Paleness.  Cold sensitivity.  Indigestion.  Nausea.  Difficulty sleeping.  Difficulty concentrating. Symptoms may occur suddenly or they may develop slowly. How is this diagnosed? Additional blood tests are often needed. These help your health care provider determine the best treatment. Your health care provider will check your stool for blood and look for other causes of blood loss. How is this treated? Treatment varies depending on the cause of the anemia. Treatment can include:  Supplements of iron, vitamin B12, or folic acid.  Hormone medicines.  A blood transfusion. This may be needed if blood loss is severe.  Hospitalization. This may be needed if there is significant continual blood loss.  Dietary changes.  Spleen removal. Follow these instructions at home: Keep all follow-up appointments. It often takes many weeks to correct anemia, and having your health care provider check on your  condition and your response to treatment is very important. Get help right away if:  You develop extreme weakness, shortness of breath, or chest pain.  You become dizzy or have trouble concentrating.  You develop heavy vaginal bleeding.  You develop a rash.  You have bloody or black, tarry stools.  You faint.  You vomit up blood.  You vomit repeatedly.  You have abdominal pain.  You have a fever or persistent symptoms for more than 2-3 days.  You have a fever and your symptoms suddenly get worse.  You are dehydrated. This information is not intended to replace advice given to you by your health care provider. Make sure you discuss any questions you have with your health care provider. Document Released: 03/30/2004 Document Revised: 08/04/2015 Document Reviewed: 08/16/2012 Elsevier Interactive Patient Education  2017 Elsevier Inc.  

## 2016-09-09 ENCOUNTER — Encounter: Payer: Self-pay | Admitting: Internal Medicine

## 2016-09-09 LAB — ZINC: Zinc: 42 ug/dL — ABNORMAL LOW (ref 60–130)

## 2016-09-09 MED ORDER — FERROUS SULFATE 325 (65 FE) MG PO TABS: 325.0000 mg | ORAL_TABLET | Freq: Every day | ORAL | 3 refills | Status: DC

## 2016-09-09 MED ORDER — ZINC GLUCONATE 50 MG PO TABS: 50.0000 mg | ORAL_TABLET | Freq: Every day | ORAL | 3 refills | Status: DC

## 2016-09-12 LAB — VITAMIN B1: Vitamin B1 (Thiamine): 12 nmol/L (ref 8–30)

## 2016-09-25 ENCOUNTER — Encounter: Payer: Self-pay | Admitting: Internal Medicine

## 2016-09-26 ENCOUNTER — Encounter: Payer: Self-pay | Admitting: Internal Medicine

## 2016-09-26 ENCOUNTER — Ambulatory Visit (INDEPENDENT_AMBULATORY_CARE_PROVIDER_SITE_OTHER): Payer: BLUE CROSS/BLUE SHIELD | Admitting: Internal Medicine

## 2016-09-26 VITALS — BP 142/88 | HR 70 | Temp 98.3°F | Resp 12 | Ht 70.0 in | Wt 229.0 lb

## 2016-09-26 DIAGNOSIS — J029 Acute pharyngitis, unspecified: Secondary | ICD-10-CM | POA: Diagnosis not present

## 2016-09-26 DIAGNOSIS — J020 Streptococcal pharyngitis: Secondary | ICD-10-CM | POA: Diagnosis not present

## 2016-09-26 DIAGNOSIS — Z23 Encounter for immunization: Secondary | ICD-10-CM

## 2016-09-26 LAB — POCT RAPID STREP A (OFFICE)

## 2016-09-26 MED ORDER — AMOXICILLIN 875 MG PO TABS: 875.0000 mg | ORAL_TABLET | Freq: Two times a day (BID) | ORAL | 0 refills | Status: AC

## 2016-09-26 NOTE — Progress Notes (Signed)
Subjective:  Patient ID: Bradley [name], male    DOB: Jul 15, 1970  Age: 46 y.o. MRN: [id number]  CC: Acute Visit (sore throat for about a week )   HPI Bradley [name] presents for a 2 day history of sore throat and odynophagia. He had left over amoxicillin at home so he took 3 doses on the day prior to this appointment which has helped with the symptoms.  Outpatient Medications Prior to Visit  Medication Sig Dispense Refill  . Cholecalciferol 2000 units TABS Take 1 tablet (2,000 Units total) by mouth daily. 90 tablet 3  . ferrous sulfate 325 (65 FE) MG tablet Take 1 tablet (325 mg total) by mouth daily with breakfast. 90 tablet 3  . oxyCODONE-acetaminophen (PERCOCET) 7.5-325 MG tablet Take 1 tablet by mouth every 4 (four) hours as needed for severe pain.    Mar[place] Kitchen zinc gluconate 50 MG tablet Take 1 tablet (50 mg total) by mouth daily. 90 tablet 3   No facility-administered medications prior to visit.     ROS Review of Systems  Constitutional: Negative for appetite change, chills, fatigue and fever.  HENT: Positive for sore throat and trouble swallowing. Negative for facial swelling and voice change.   Eyes: Negative.   Respiratory: Negative.  Negative for cough, chest tightness, shortness of breath and wheezing.   Cardiovascular: Negative for chest pain, palpitations and leg swelling.  Gastrointestinal: Negative for abdominal pain, constipation, diarrhea, nausea and vomiting.  Endocrine: Negative.   Genitourinary: Negative.   Musculoskeletal: Positive for arthralgias. Negative for myalgias and neck pain.  Skin: Negative.  Negative for color change and rash.  Allergic/Immunologic: Negative.   Neurological: Negative for dizziness and headaches.  Hematological: Negative.  Negative for adenopathy. Does not bruise/bleed easily.  Psychiatric/Behavioral: Negative.     Objective:  BP (!) 142/88 (BP Location: Left Arm, Patient Position: Sitting, Cuff Size: Normal)   Pulse 70   Temp 98.3  F (36.8 C) (Oral)   Resp 12   Ht 5' 10"  (1.778 m)   Wt 229 lb (103.9 kg)   SpO2 99%   BMI 32.86 kg/m   BP Readings from Last 3 Encounters:  09/26/16 (!) 142/88  09/05/16 138/88  04/28/16 (!) 152/93    Wt Readings from Last 3 Encounters:  09/26/16 229 lb (103.9 kg)  09/05/16 233 lb (105.7 kg)  04/28/16 228 lb 1.6 oz (103.5 kg)    Physical Exam  Constitutional: He is oriented to person, place, and time.  Non-toxic appearance. He does not have a sickly appearance. He does not appear ill. No distress.  HENT:  Mouth/Throat: Mucous membranes are not pale, not dry and not cyanotic. No oral lesions. No trismus in the jaw. No uvula swelling. Posterior oropharyngeal erythema present. No oropharyngeal exudate, posterior oropharyngeal edema or tonsillar abscesses.  Eyes: Conjunctivae are normal. Right eye exhibits no discharge. Left eye exhibits no discharge. No scleral icterus.  Neck: Normal range of motion. Neck supple. No JVD present. No thyromegaly present.  Cardiovascular: Normal rate, regular rhythm and intact distal pulses.  Exam reveals no gallop.   No murmur heard. Pulmonary/Chest: Effort normal and breath sounds normal. No respiratory distress. He has no wheezes. He has no rales. He exhibits no tenderness.  Abdominal: Soft. Bowel sounds are normal. He exhibits no distension and no mass. There is no tenderness. There is no rebound and no guarding.  Musculoskeletal: Normal range of motion. He exhibits no edema, tenderness or deformity.  Lymphadenopathy:    He has  no cervical adenopathy.  Neurological: He is alert and oriented to person, place, and time.  Skin: Skin is warm and dry. No rash noted. He is not diaphoretic. No erythema. No pallor.  Vitals reviewed.   Lab Results  Component Value Date   WBC 6.5 09/05/2016   HGB 13.3 09/05/2016   HCT 40.2 09/05/2016   PLT 266.0 09/05/2016   GLUCOSE 102 (H) 09/05/2016   CHOL 137 09/05/2016   TRIG 133.0 09/05/2016   HDL 43.90  09/05/2016   LDLCALC 67 09/05/2016   ALT 16 09/05/2016   AST 15 09/05/2016   NA 136 09/05/2016   K 4.8 09/05/2016   CL 103 09/05/2016   CREATININE 1.00 09/05/2016   BUN 14 09/05/2016   CO2 29 09/05/2016   TSH 1.54 09/05/2016   HGBA1C 4.9 10/05/2009    No results found.  Assessment & Plan:   Bradley [name] was seen today for acute visit.  Diagnoses and all orders for this visit:  Sore throat -     POCT rapid strep A  Streptococcal sore throat- his rapid strep screen is negative however he premedicated with amoxicillin for 24 hours so the results are irrelevant. He has responded somewhat to amoxicillin so will presume that this is strep throat and treat with a ten-day course of amoxicillin. -     amoxicillin (AMOXIL) 875 MG tablet; Take 1 tablet (875 mg total) by mouth 2 (two) times daily.  Need for prophylactic vaccination against hepatitis A and hepatitis B -     Cancel: Hepatitis A hepatitis B combined vaccine IM -     Hepatitis A hepatitis B combined vaccine IM   I am having Bradley [name] start on amoxicillin. I am also having him maintain his oxyCODONE-acetaminophen, Cholecalciferol, zinc gluconate, and ferrous sulfate.  Meds ordered this encounter  Medications  . amoxicillin (AMOXIL) 875 MG tablet    Sig: Take 1 tablet (875 mg total) by mouth 2 (two) times daily.    Dispense:  20 tablet    Refill:  0     Follow-up: Return if symptoms worsen or fail to improve.  [name], MD

## 2016-09-26 NOTE — Patient Instructions (Signed)
Sore Throat A sore throat is pain, burning, irritation, or scratchiness in the throat. When you have a sore throat, you may feel pain or tenderness in your throat when you swallow or talk. Many things can cause a sore throat, including:  An infection.  Seasonal allergies.  Dryness in the air.  Irritants, such as smoke or pollution.  Gastroesophageal reflux disease (GERD).  A tumor.  A sore throat is often the first sign of another sickness. It may happen with other symptoms, such as coughing, sneezing, fever, and swollen neck glands. Most sore throats go away without medical treatment. Follow these instructions at home:  Take over-the-counter medicines only as told by your health care provider.  Drink enough fluids to keep your urine clear or pale yellow.  Rest as needed.  To help with pain, try: ? Sipping warm liquids, such as broth, herbal tea, or warm water. ? Eating or drinking cold or frozen liquids, such as frozen ice pops. ? Gargling with a salt-water mixture 3-4 times a day or as needed. To make a salt-water mixture, completely dissolve -1 tsp of salt in 1 cup of warm water. ? Sucking on hard candy or throat lozenges. ? Putting a cool-mist humidifier in your bedroom at night to moisten the air. ? Sitting in the bathroom with the door closed for 5-10 minutes while you run hot water in the shower.  Do not use any tobacco products, such as cigarettes, chewing tobacco, and e-cigarettes. If you need help quitting, ask your health care provider. Contact a health care provider if:  You have a fever for more than 2-3 days.  You have symptoms that last (are persistent) for more than 2-3 days.  Your throat does not get better within 7 days.  You have a fever and your symptoms suddenly get worse. Get help right away if:  You have difficulty breathing.  You cannot swallow fluids, soft foods, or your saliva.  You have increased swelling in your throat or neck.  You have  persistent nausea and vomiting. This information is not intended to replace advice given to you by your health care provider. Make sure you discuss any questions you have with your health care provider. Document Released: 03/30/2004 Document Revised: 10/17/2015 Document Reviewed: 12/11/2014 Elsevier Interactive Patient Education  [employer].

## 2016-10-27 ENCOUNTER — Ambulatory Visit: Payer: BLUE CROSS/BLUE SHIELD

## 2016-10-27 ENCOUNTER — Ambulatory Visit (INDEPENDENT_AMBULATORY_CARE_PROVIDER_SITE_OTHER): Payer: BLUE CROSS/BLUE SHIELD | Admitting: General Practice

## 2016-10-27 DIAGNOSIS — Z23 Encounter for immunization: Secondary | ICD-10-CM

## 2016-10-30 ENCOUNTER — Ambulatory Visit: Payer: BLUE CROSS/BLUE SHIELD

## 2016-10-31 DIAGNOSIS — L821 Other seborrheic keratosis: Secondary | ICD-10-CM | POA: Diagnosis not present

## 2016-10-31 DIAGNOSIS — A630 Anogenital (venereal) warts: Secondary | ICD-10-CM | POA: Diagnosis not present

## 2016-10-31 DIAGNOSIS — D225 Melanocytic nevi of trunk: Secondary | ICD-10-CM | POA: Diagnosis not present

## 2016-10-31 DIAGNOSIS — L218 Other seborrheic dermatitis: Secondary | ICD-10-CM | POA: Diagnosis not present

## 2016-10-31 DIAGNOSIS — B360 Pityriasis versicolor: Secondary | ICD-10-CM | POA: Diagnosis not present

## 2016-11-23 DIAGNOSIS — M5136 Other intervertebral disc degeneration, lumbar region: Secondary | ICD-10-CM | POA: Diagnosis not present

## 2016-11-23 DIAGNOSIS — Z79891 Long term (current) use of opiate analgesic: Secondary | ICD-10-CM | POA: Diagnosis not present

## 2016-11-23 DIAGNOSIS — M961 Postlaminectomy syndrome, not elsewhere classified: Secondary | ICD-10-CM | POA: Diagnosis not present

## 2016-11-23 DIAGNOSIS — M25511 Pain in right shoulder: Secondary | ICD-10-CM | POA: Diagnosis not present

## 2016-11-23 DIAGNOSIS — G894 Chronic pain syndrome: Secondary | ICD-10-CM | POA: Diagnosis not present

## 2016-12-06 ENCOUNTER — Ambulatory Visit: Payer: BLUE CROSS/BLUE SHIELD | Admitting: Internal Medicine

## 2016-12-21 ENCOUNTER — Encounter: Payer: Self-pay | Admitting: Internal Medicine

## 2017-01-08 DIAGNOSIS — G894 Chronic pain syndrome: Secondary | ICD-10-CM | POA: Diagnosis not present

## 2017-01-10 DIAGNOSIS — M25511 Pain in right shoulder: Secondary | ICD-10-CM | POA: Diagnosis not present

## 2017-01-10 DIAGNOSIS — M1711 Unilateral primary osteoarthritis, right knee: Secondary | ICD-10-CM | POA: Diagnosis not present

## 2017-01-10 DIAGNOSIS — G8929 Other chronic pain: Secondary | ICD-10-CM | POA: Diagnosis not present

## 2017-01-10 DIAGNOSIS — M12811 Other specific arthropathies, not elsewhere classified, right shoulder: Secondary | ICD-10-CM | POA: Diagnosis not present

## 2017-01-10 DIAGNOSIS — M5416 Radiculopathy, lumbar region: Secondary | ICD-10-CM | POA: Diagnosis not present

## 2017-01-16 DIAGNOSIS — M12811 Other specific arthropathies, not elsewhere classified, right shoulder: Secondary | ICD-10-CM | POA: Diagnosis not present

## 2017-01-23 DIAGNOSIS — M12811 Other specific arthropathies, not elsewhere classified, right shoulder: Secondary | ICD-10-CM | POA: Diagnosis not present

## 2017-01-24 DIAGNOSIS — M1711 Unilateral primary osteoarthritis, right knee: Secondary | ICD-10-CM | POA: Diagnosis not present

## 2017-01-29 ENCOUNTER — Ambulatory Visit (INDEPENDENT_AMBULATORY_CARE_PROVIDER_SITE_OTHER): Payer: BLUE CROSS/BLUE SHIELD | Admitting: *Deleted

## 2017-01-29 DIAGNOSIS — Z23 Encounter for immunization: Secondary | ICD-10-CM

## 2017-01-30 ENCOUNTER — Telehealth: Payer: Self-pay | Admitting: *Deleted

## 2017-01-30 NOTE — Telephone Encounter (Signed)
   [facility] Pre-operative Risk Assessment    Request for surgical clearance:  1. What type of surgery is being performed? TOTAL KNEE ARTHROPLASTY- RIGHT KNEE   2. When is this surgery scheduled? TBD  3. Are there any medications that need to be held prior to surgery and how long? Not specified on clearance paperwork.  General clearance needed from cardiac perspective.  4. Practice name and name of physician performing surgery? Dr. Rod Can at [facility]. What is your office phone and fax number? Tele--[phone] and fax---[phone]   6. Anesthesia type (None, local, MAC, general) ? Not specified   [facility] 01/30/2017, 11:46 AM  _________________________________________________________________   (provider comments below)

## 2017-01-31 NOTE — Telephone Encounter (Signed)
Called patient. Advised him of need for an appointment. Patient preferred to see Dr Harrington Challenger. Appointment scheduled for 02/02/17 at 4:20p with PR. Patient verbalized understanding and agreed with plan.  Noted faxed to Dr Sid Falcon office notifying them that patient needs appointment.

## 2017-01-31 NOTE — Telephone Encounter (Signed)
    Chart reviewed as part of pre-operative protocol coverage. Because of Bradley [name]'s past medical history and time since last visit, he/she will require a follow-up visit in order to better assess preoperative cardiovascular risk. Their primary cardiologist is Dr. Harrington Challenger / Richardson Dopp PA-C.  Pre-op covering staff: - Please schedule appointment and call patient to inform them. - Please contact requesting surgeon's office via preferred method (i.e, phone, fax) to inform them of need for appointment prior to surgery.  [place], [place]  01/31/2017, 2:27 PM

## 2017-02-02 ENCOUNTER — Encounter: Payer: Self-pay | Admitting: Internal Medicine

## 2017-02-02 ENCOUNTER — Ambulatory Visit (INDEPENDENT_AMBULATORY_CARE_PROVIDER_SITE_OTHER): Payer: BLUE CROSS/BLUE SHIELD | Admitting: Internal Medicine

## 2017-02-02 VITALS — BP 170/110 | HR 73 | Ht 71.0 in | Wt 238.1 lb

## 2017-02-02 DIAGNOSIS — Z01810 Encounter for preprocedural cardiovascular examination: Secondary | ICD-10-CM | POA: Diagnosis not present

## 2017-02-02 DIAGNOSIS — E782 Mixed hyperlipidemia: Secondary | ICD-10-CM | POA: Diagnosis not present

## 2017-02-02 DIAGNOSIS — I2510 Atherosclerotic heart disease of native coronary artery without angina pectoris: Secondary | ICD-10-CM

## 2017-02-02 DIAGNOSIS — I10 Essential (primary) hypertension: Secondary | ICD-10-CM | POA: Diagnosis not present

## 2017-02-02 MED ORDER — AMLODIPINE BESYLATE 2.5 MG PO TABS: 2.5000 mg | ORAL_TABLET | Freq: Every day | ORAL | 3 refills | Status: DC

## 2017-02-02 NOTE — Progress Notes (Signed)
Cardiology Office Note   Date:  02/02/2017   ID:  Bradley [name], DOB Jun 29, 1970, MRN [id number]  PCP:  [name], MD  Cardiologist:   [name], MD   Pt referred for evaluation of murmur    History of Present Illness: Bradley [name] is a 46 y.o. male with a history of HTN, obesity (s/p gastric bypass) and murmur  I saw him in 2016  Echo showed aortic sclerosis  CT did show coronary calcifications.   He was seen by Kathleen Argue in 2017 as preop evla for back surgery   He is now being eval for knee surgery  He is very active  Plays with kids   Denies CP  No SOB  No Dizziness     Current Outpatient Medications  Medication Sig Dispense Refill  . Multiple Vitamin (MULTIVITAMIN) tablet Take 1 tablet by mouth daily.    Marland Kitchen oxyCODONE-acetaminophen (PERCOCET) 7.5-325 MG tablet Take 1 tablet by mouth every 4 (four) hours as needed for severe pain.    . Cholecalciferol 2000 units TABS Take 1 tablet (2,000 Units total) by mouth daily. (Patient not taking: Reported on 02/02/2017) 90 tablet 3  . ferrous sulfate 325 (65 FE) MG tablet Take 1 tablet (325 mg total) by mouth daily with breakfast. (Patient not taking: Reported on 02/02/2017) 90 tablet 3  . zinc gluconate 50 MG tablet Take 1 tablet (50 mg total) by mouth daily. (Patient not taking: Reported on 02/02/2017) 90 tablet 3   No current facility-administered medications for this visit.     Allergies:   Codeine and Percocet [oxycodone-acetaminophen]   Past Medical History:  Diagnosis Date  . Anemia    since gastric bypass  . Anxiety   . Arthritis   . Arthropathy, unspecified, other specified sites   . Contact with or exposure to venereal diseases   . Depression   . Displacement of intervertebral disc, site unspecified, without myelopathy   . GERD (gastroesophageal reflux disease)   . Heart murmur    " slight, [place] said nothing to worry about."  . History of kidney stones   . Hypertension     " no longer have  hypertension since gastric bypass "  . Lumbago   . Obesity, unspecified   . Other, mixed, or unspecified nondependent drug abuse, unspecified   . Personal history of urinary calculi   . Pneumonia   . Priapism   . Stenosis, spinal, lumbar     Past Surgical History:  Procedure Laterality Date  . ABDOMINAL SURGERY    . ANKLE SURGERY    . ANTERIOR LAT LUMBAR FUSION Left 02/24/2016   Procedure: LEFT LUMBAR TWO-THREE  ANTERIOR LATERAL LUMBAR FUSION  ;  Surgeon: Earnie Larsson, MD;  Location: [facility];  Service: Neurosurgery;  Laterality: Left;  . BACK SURGERY    . GASTRIC BYPASS    . HERNIA REPAIR    . INCISION AND DRAINAGE ABSCESS Right 07/06/2004   great toe  . IRRIGATION AND DEBRIDEMENT ABSCESS Right 07/08/2004   great toe  . KNEE SURGERY    . LUMBAR PERCUTANEOUS PEDICLE SCREW 1 LEVEL Left 02/24/2016   Procedure: LUMBAR PERCUTANEOUS PEDICLE SCREW 1 LEVEL;  Surgeon: Earnie Larsson, MD;  Location: [facility];  Service: Neurosurgery;  Laterality: Left;  Marland Kitchen MICRODISCECTOMY LUMBAR Left 08/24/2008   T12 - L1  . panectomy       Social History:  The patient  reports that  has never smoked. he has never used smokeless  tobacco. He reports that he does not drink alcohol or use drugs.   Family History:  The patient's family history includes Arthritis in his mother; Heart disease in his father; Hypertension in his father; Kidney disease in his father.    ROS:  Please see the history of present illness. All other systems are reviewed and  Negative to the above problem except as noted.    PHYSICAL EXAM: VS:  BP (!) 170/110   Pulse 73   Ht 5' 11"  (1.803 m)   Wt 238 lb 2 oz (108 kg)   BMI 33.21 kg/m   GEN: Well nourished, well developed, in no acute distress  HEENT: normal  Neck: JVP normal  , carotid bruits, or masses Cardiac: RRR; Gr II/VI systolic murmur LSB to base  No edema  2+ pulses   Respiratory:  clear to auscultation bilaterally, normal work of breathing GI: soft, nontender, nondistended, + BS   No hepatomegaly  MS: no deformity Moving all extremities   Skin: warm and dry, no rash Neuro:  Strength and sensation are intact Psych: euthymic mood, full affect   EKG:  EKG is ordered today   SR 73 bpm  LVH     Lipid Panel    Component Value Date/Time   CHOL 137 09/05/2016 1411   TRIG 133.0 09/05/2016 1411   HDL 43.90 09/05/2016 1411   CHOLHDL 3 09/05/2016 1411   VLDL 26.6 09/05/2016 1411   LDLCALC 67 09/05/2016 1411      Wt Readings from Last 3 Encounters:  02/02/17 238 lb 2 oz (108 kg)  09/26/16 229 lb (103.9 kg)  09/05/16 233 lb (105.7 kg)      ASSESSMENT AND PLAN: 1   preop eval   Pt with HTN, coronary calcifications of CAD   He is asymptomatic and active  From a cardiac standpoint he is low risk for a major cardiac event  OK to proceed without further testing   2  CAD  Asymptomatic  3  HL  Good control  4  HTN  BP is high  I would recim  Amlodipine 2.5 mg  Pt was on ACE I before    Signed, [name], MD  02/02/2017 4:40 PM    [address], [place], [place]  [address] Phone: [phone]; Fax: [phone]

## 2017-02-02 NOTE — Patient Instructions (Signed)
Your physician has recommended you make the following change in your medication:  1.) start amlodipine 2.5 mg once daily  Take your blood pressure at home.  Please call or use My Chart to provide a list of blood pressure readings to Dr. Harrington Challenger in 2-3 weeks.

## 2017-03-21 ENCOUNTER — Ambulatory Visit: Payer: Self-pay | Admitting: Orthopedic Surgery

## 2017-03-31 ENCOUNTER — Other Ambulatory Visit: Payer: Self-pay | Admitting: Emergency Medicine

## 2017-03-31 ENCOUNTER — Ambulatory Visit: Payer: BLUE CROSS/BLUE SHIELD | Admitting: Adult Health

## 2017-03-31 ENCOUNTER — Encounter: Payer: Self-pay | Admitting: Adult Health

## 2017-03-31 VITALS — BP 124/77 | HR 77 | Temp 98.3°F | Resp 16 | Wt 228.0 lb

## 2017-03-31 DIAGNOSIS — J069 Acute upper respiratory infection, unspecified: Secondary | ICD-10-CM | POA: Diagnosis not present

## 2017-03-31 MED ORDER — DOXYCYCLINE HYCLATE 100 MG PO CAPS: 100.0000 mg | ORAL_CAPSULE | Freq: Two times a day (BID) | ORAL | 0 refills | Status: DC

## 2017-03-31 NOTE — Progress Notes (Signed)
Subjective:    Patient ID: SASAN WILKIE, male    DOB: 05-10-1970, 47 y.o.   MRN: [id number]  Multiple sick contacts   URI   This is a new problem. The current episode started in the past 7 days (2days ). The problem has been gradually improving. The maximum temperature recorded prior to his arrival was 103 - 104 F (103). Associated symptoms include congestion, coughing (productive ), headaches, rhinorrhea and a sore throat. Pertinent negatives include no diarrhea, sinus pain, vomiting or wheezing. He has tried acetaminophen for the symptoms. The treatment provided mild relief.     Review of Systems  Constitutional: Positive for activity change, chills, diaphoresis, fatigue and fever. Negative for appetite change.  HENT: Positive for congestion, rhinorrhea, sinus pressure and sore throat. Negative for postnasal drip, sinus pain, tinnitus and trouble swallowing.   Eyes: Negative.   Respiratory: Positive for cough (productive ), chest tightness and shortness of breath. Negative for wheezing.   Cardiovascular: Negative.   Gastrointestinal: Negative for diarrhea and vomiting.  Neurological: Positive for headaches.   Past Medical History:  Diagnosis Date  . Anemia    since gastric bypass  . Anxiety   . Arthritis   . Arthropathy, unspecified, other specified sites   . Contact with or exposure to venereal diseases   . Depression   . Displacement of intervertebral disc, site unspecified, without myelopathy   . GERD (gastroesophageal reflux disease)   . Heart murmur    " slight, [place] said nothing to worry about."  . History of kidney stones   . Hypertension     " no longer have hypertension since gastric bypass "  . Lumbago   . Obesity, unspecified   . Other, mixed, or unspecified nondependent drug abuse, unspecified   . Personal history of urinary calculi   . Pneumonia   . Priapism   . Stenosis, spinal, lumbar     Social History   Socioeconomic History  . Marital  status: Married    Spouse name: Not on file  . Number of children: Not on file  . Years of education: Not on file  . Highest education level: Not on file  Social Needs  . Financial resource strain: Not on file  . Food insecurity - worry: Not on file  . Food insecurity - inability: Not on file  . Transportation needs - medical: Not on file  . Transportation needs - non-medical: Not on file  Occupational History  . Not on file  Tobacco Use  . Smoking status: Never Smoker  . Smokeless tobacco: Never Used  Substance and Sexual Activity  . Alcohol use: No  . Drug use: No  . Sexual activity: Yes  Other Topics Concern  . Not on file  Social History Narrative  . Not on file    Past Surgical History:  Procedure Laterality Date  . ABDOMINAL SURGERY    . ANKLE SURGERY    . ANTERIOR LAT LUMBAR FUSION Left 02/24/2016   Procedure: LEFT LUMBAR TWO-THREE  ANTERIOR LATERAL LUMBAR FUSION  ;  Surgeon: Earnie Larsson, MD;  Location: [facility];  Service: Neurosurgery;  Laterality: Left;  . BACK SURGERY    . GASTRIC BYPASS    . HERNIA REPAIR    . INCISION AND DRAINAGE ABSCESS Right 07/06/2004   great toe  . IRRIGATION AND DEBRIDEMENT ABSCESS Right 07/08/2004   great toe  . KNEE SURGERY    . LUMBAR PERCUTANEOUS PEDICLE SCREW 1 LEVEL Left 02/24/2016  Procedure: LUMBAR PERCUTANEOUS PEDICLE SCREW 1 LEVEL;  Surgeon: Earnie Larsson, MD;  Location: [facility];  Service: Neurosurgery;  Laterality: Left;  Marland Kitchen MICRODISCECTOMY LUMBAR Left 08/24/2008   T12 - L1  . panectomy      Family History  Problem Relation Age of Onset  . Arthritis Mother   . Heart disease Father   . Hypertension Father   . Kidney disease Father     Allergies  Allergen Reactions  . Codeine Hives    Hives and itching  . Percocet [Oxycodone-Acetaminophen] Hives    Hives and itching    Current Outpatient Medications on File Prior to Visit  Medication Sig Dispense Refill  . amLODipine (NORVASC) 2.5 MG tablet Take 1 tablet (2.5 mg total) by  mouth daily. 90 tablet 3  . Cholecalciferol 2000 units TABS Take 1 tablet (2,000 Units total) by mouth daily. 90 tablet 3  . ferrous sulfate 325 (65 FE) MG tablet Take 1 tablet (325 mg total) by mouth daily with breakfast. 90 tablet 3  . Multiple Vitamin (MULTIVITAMIN) tablet Take 1 tablet by mouth daily.    Marland Kitchen oxyCODONE-acetaminophen (PERCOCET) 7.5-325 MG tablet Take 1 tablet by mouth every 4 (four) hours as needed for severe pain.    Marland Kitchen zinc gluconate 50 MG tablet Take 1 tablet (50 mg total) by mouth daily. 90 tablet 3   No current facility-administered medications on file prior to visit.     BP 124/77   Pulse 77   Temp 98.3 F (36.8 C) (Oral)   Resp 16   Wt 228 lb (103.4 kg)   SpO2 97%   BMI 31.80 kg/m       Objective:   Physical Exam  Constitutional: He is oriented to person, place, and time. He appears well-developed and well-nourished. No distress.  HENT:  Head: Atraumatic.  Right Ear: Hearing, tympanic membrane, external ear and ear canal normal.  Left Ear: Hearing, tympanic membrane, external ear and ear canal normal.  Nose: Rhinorrhea present.  Eyes: Conjunctivae and EOM are normal. Pupils are equal, round, and reactive to light. Right eye exhibits no discharge. Left eye exhibits no discharge. No scleral icterus.  Cardiovascular: Normal rate, regular rhythm, normal heart sounds and intact distal pulses. Exam reveals no gallop and no friction rub.  No murmur heard. Pulmonary/Chest: Effort normal. No respiratory distress. He has no decreased breath sounds. He has wheezes. He has no rhonchi. He has no rales. He exhibits no tenderness.  Neurological: He is alert and oriented to person, place, and time.  Skin: Skin is warm and dry. No rash noted. He is not diaphoretic. No erythema. No pallor.  Psychiatric: He has a normal mood and affect. His behavior is normal. Judgment and thought content normal.  Nursing note and vitals reviewed.     Assessment & Plan:  1. Upper  respiratory tract infection, unspecified type - doubt pna but will treat URI with doxycycline 100 mg BID x 7 days  - doxycycline (VIBRAMYCIN) 100 MG capsule; Take 1 capsule (100 mg total) by mouth 2 (two) times daily.  Dispense: 14 capsule; Refill: 0 - Stay hydrated and rest  - Follow up with PCP if needed  Dorothyann Peng, NP

## 2017-04-01 ENCOUNTER — Encounter: Payer: Self-pay | Admitting: Adult Health

## 2017-04-06 HISTORY — PX: TOTAL KNEE ARTHROPLASTY: SHX125

## 2017-04-09 ENCOUNTER — Encounter: Payer: Self-pay | Admitting: Internal Medicine

## 2017-04-09 ENCOUNTER — Ambulatory Visit: Payer: BLUE CROSS/BLUE SHIELD | Admitting: Internal Medicine

## 2017-04-09 VITALS — BP 138/90 | HR 68 | Temp 98.3°F | Resp 16 | Ht 71.0 in | Wt 233.2 lb

## 2017-04-09 DIAGNOSIS — K047 Periapical abscess without sinus: Secondary | ICD-10-CM | POA: Insufficient documentation

## 2017-04-09 MED ORDER — CLINDAMYCIN HCL 300 MG PO CAPS: 300.0000 mg | ORAL_CAPSULE | Freq: Three times a day (TID) | ORAL | 0 refills | Status: AC

## 2017-04-09 NOTE — Patient Instructions (Signed)
Dental Abscess A dental abscess is a collection of pus in or around a tooth. What are the causes? This condition is caused by a bacterial infection around the root of the tooth that involves the inner part of the tooth (pulp). It may result from:  Severe tooth decay.  Trauma to the tooth that allows bacteria to enter into the pulp, such as a broken or chipped tooth.  Severe gum disease around a tooth.  What are the signs or symptoms? Symptoms of this condition include:  Severe pain in and around the infected tooth.  Swelling and redness around the infected tooth, in the mouth, or in the face.  Tenderness.  Pus drainage.  Bad breath.  Bitter taste in the mouth.  Difficulty swallowing.  Difficulty opening the mouth.  Nausea.  Vomiting.  Chills.  Swollen neck glands.  Fever.  How is this diagnosed? This condition is diagnosed with examination of the infected tooth. During the exam, your dentist may tap on the infected tooth. Your dentist will also ask about your medical and dental history and may order X-rays. How is this treated? This condition is treated by eliminating the infection. This may be done with:  Antibiotic medicine.  A root canal. This may be performed to save the tooth.  Pulling (extracting) the tooth. This may also involve draining the abscess. This is done if the tooth cannot be saved.  Follow these instructions at home:  Take medicines only as directed by your dentist.  If you were prescribed antibiotic medicine, finish all of it even if you start to feel better.  Rinse your mouth (gargle) often with salt water to relieve pain or swelling.  Do not drive or operate heavy machinery while taking pain medicine.  Do not apply heat to the outside of your mouth.  Keep all follow-up visits as directed by your dentist. This is important. Contact a health care provider if:  Your pain is worse and is not helped by medicine. Get help right away  if:  You have a fever or chills.  Your symptoms suddenly get worse.  You have a very bad headache.  You have problems breathing or swallowing.  You have trouble opening your mouth.  You have swelling in your neck or around your eye. This information is not intended to replace advice given to you by your health care provider. Make sure you discuss any questions you have with your health care provider. Document Released: 02/20/2005 Document Revised: 07/01/2015 Document Reviewed: 02/17/2014 Elsevier Interactive Patient Education  2018 [employer]  

## 2017-04-10 NOTE — Progress Notes (Signed)
Subjective:  Patient ID: Bradley [name], male    DOB: 1970/10/17  Age: 47 y.o. MRN: [id number]  CC: Dental Problem   HPI Bradley [name] presents for a 4-day history of pain over his left lower jaw.  He has noticed pus coming from the back of the tooth on the left lower side.  It is the only tooth he has remaining on that side.  The pain decreased some after the pus drained.  He is controlling the pain with hydrocodone.  He is not having any trouble breathing or swallowing and he denies chills, fever, nausea, vomiting, facial or neck swelling, or rash.  Outpatient Medications Prior to Visit  Medication Sig Dispense Refill  . amLODipine (NORVASC) 2.5 MG tablet Take 1 tablet (2.5 mg total) by mouth daily. 90 tablet 3  . Cholecalciferol 2000 units TABS Take 1 tablet (2,000 Units total) by mouth daily. 90 tablet 3  . ferrous sulfate 325 (65 FE) MG tablet Take 1 tablet (325 mg total) by mouth daily with breakfast. 90 tablet 3  . HYDROcodone-acetaminophen (NORCO) 10-325 MG tablet Take by mouth.    . Multiple Vitamin (MULTIVITAMIN) tablet Take 1 tablet by mouth daily.    Marland Kitchen zinc gluconate 50 MG tablet Take 1 tablet (50 mg total) by mouth daily. 90 tablet 3  . doxycycline (VIBRAMYCIN) 100 MG capsule Take 1 capsule (100 mg total) by mouth 2 (two) times daily. 14 capsule 0  . oxyCODONE-acetaminophen (PERCOCET) 7.5-325 MG tablet Take 1 tablet by mouth every 4 (four) hours as needed for severe pain.     No facility-administered medications prior to visit.     ROS Review of Systems  Constitutional: Negative for chills, fatigue and fever.  HENT: Positive for dental problem. Negative for facial swelling, sinus pressure, sore throat, trouble swallowing and voice change.   Eyes: Negative for visual disturbance.  Respiratory: Negative.  Negative for cough, chest tightness, shortness of breath and wheezing.   Cardiovascular: Negative.  Negative for chest pain, palpitations and leg swelling.    Gastrointestinal: Negative.  Negative for abdominal pain, diarrhea, nausea and vomiting.  Endocrine: Negative.   Genitourinary: Negative.  Negative for difficulty urinating.  Musculoskeletal: Negative.  Negative for back pain and neck pain.  Skin: Negative.  Negative for color change and rash.  Allergic/Immunologic: Negative.   Neurological: Negative.  Negative for dizziness, weakness and headaches.  Hematological: Negative for adenopathy. Does not bruise/bleed easily.  Psychiatric/Behavioral: Negative.     Objective:  BP 138/90 (BP Location: Left Arm, Patient Position: Sitting, Cuff Size: Large)   Pulse 68   Temp 98.3 F (36.8 C) (Oral)   Resp 16   Ht 5' 11"  (1.803 m)   Wt 233 lb 4 oz (105.8 kg)   SpO2 99%   BMI 32.53 kg/m   BP Readings from Last 3 Encounters:  04/09/17 138/90  03/31/17 124/77  02/02/17 (!) 170/110    Wt Readings from Last 3 Encounters:  04/09/17 233 lb 4 oz (105.8 kg)  03/31/17 228 lb (103.4 kg)  02/02/17 238 lb 2 oz (108 kg)    Physical Exam  Constitutional: He is oriented to person, place, and time. No distress.  HENT:  Mouth/Throat: Oropharynx is clear and moist. Mucous membranes are not pale. No oropharyngeal exudate or posterior oropharyngeal erythema.    Eyes: Conjunctivae are normal. Left eye exhibits no discharge. No scleral icterus.  Neck: Normal range of motion. Neck supple. No JVD present. No thyromegaly present.  Cardiovascular: Normal  rate, regular rhythm and normal heart sounds. Exam reveals no gallop.  No murmur heard. Pulmonary/Chest: Effort normal and breath sounds normal. No respiratory distress. He has no wheezes. He has no rales.  Abdominal: Soft. Bowel sounds are normal. He exhibits no distension and no mass. There is no tenderness.  Musculoskeletal: Normal range of motion. He exhibits no edema or tenderness.  Lymphadenopathy:    He has no cervical adenopathy.  Neurological: He is alert and oriented to person, place, and  time.  Skin: Skin is warm and dry. No rash noted. He is not diaphoretic. No erythema. No pallor.  Vitals reviewed.   Lab Results  Component Value Date   WBC 6.5 09/05/2016   HGB 13.3 09/05/2016   HCT 40.2 09/05/2016   PLT 266.0 09/05/2016   GLUCOSE 102 (H) 09/05/2016   CHOL 137 09/05/2016   TRIG 133.0 09/05/2016   HDL 43.90 09/05/2016   LDLCALC 67 09/05/2016   ALT 16 09/05/2016   AST 15 09/05/2016   NA 136 09/05/2016   K 4.8 09/05/2016   CL 103 09/05/2016   CREATININE 1.00 09/05/2016   BUN 14 09/05/2016   CO2 29 09/05/2016   TSH 1.54 09/05/2016   HGBA1C 4.9 10/05/2009    No results found.  Assessment & Plan:   Antwyne was seen today for dental problem.  Diagnoses and all orders for this visit:  Dental abscess- I will treat the infection with clindamycin.  He will continue using an opiate for pain relief.  I have asked him to see oral surgery and to see if the area needs to be drained. -     Ambulatory referral to Oral Maxillofacial Surgery  Other orders -     clindamycin (CLEOCIN) 300 MG capsule; Take 1 capsule (300 mg total) by mouth 3 (three) times daily for 7 days.   I have discontinued Jeanluc Wegman. Roussin's oxyCODONE-acetaminophen and doxycycline. I am also having him start on clindamycin. Additionally, I am having him maintain his Cholecalciferol, zinc gluconate, ferrous sulfate, multivitamin, amLODipine, and HYDROcodone-acetaminophen.  Meds ordered this encounter  Medications  . clindamycin (CLEOCIN) 300 MG capsule    Sig: Take 1 capsule (300 mg total) by mouth 3 (three) times daily for 7 days.    Dispense:  21 capsule    Refill:  0     Follow-up: Return in about 3 weeks (around 04/30/2017).  [name], MD

## 2017-04-11 ENCOUNTER — Ambulatory Visit: Payer: Self-pay | Admitting: Orthopedic Surgery

## 2017-04-11 NOTE — H&P (View-Only) (Signed)
TOTAL KNEE ADMISSION H&P  Patient is being admitted for right total knee arthroplasty.  Subjective:  Chief Complaint:right knee pain.  HPI: Bradley [name], 47 y.o. male, has a history of pain and functional disability in the right knee due to arthritis and has failed non-surgical conservative treatments for greater than 12 weeks to includeNSAID's and/or analgesics, corticosteriod injections, viscosupplementation injections, flexibility and strengthening excercises, use of assistive devices, weight reduction as appropriate and activity modification.  Onset of symptoms was gradual, starting 10 years ago with gradually worsening course since that time. The patient noted prior procedures on the knee to include  arthroscopy on the right knee(s).  Patient currently rates pain in the right knee(s) at 10 out of 10 with activity. Patient has night pain, worsening of pain with activity and weight bearing, pain that interferes with activities of daily living, pain with passive range of motion, crepitus and joint swelling.  Patient has evidence of subchondral cysts, subchondral sclerosis, periarticular osteophytes and joint space narrowing by imaging studies. There is no active infection.  Patient Active Problem List   Diagnosis Date Noted  . Dental abscess 04/09/2017  . Streptococcal sore throat 09/26/2016  . Sore throat 09/26/2016  . Elevated LFTs 09/05/2016  . Degenerative spondylolisthesis 02/24/2016  . [facility] DJD(carpometacarpal degenerative joint disease), localized primary 10/04/2015  . Routine general medical examination at a health care facility 06/08/2015  . Cough variant asthma 02/06/2015  . Zinc deficiency 04/12/2014  . B12 deficiency anemia 04/08/2014  . Vitamin D deficiency 07/26/2012  . Gastric bypass status for obesity 07/25/2012  . Anemia, iron deficiency 07/25/2012  . NARCOTIC ABUSE 03/25/2009  . EXOGENOUS OBESITY 08/22/2007  . DJD (degenerative joint disease) of knee 03/12/2007  .  Essential hypertension 10/08/2006  . BACK PAIN, LUMBAR 10/08/2006   Past Medical History:  Diagnosis Date  . Anemia    since gastric bypass  . Anxiety   . Arthritis   . Arthropathy, unspecified, other specified sites   . Contact with or exposure to venereal diseases   . Depression   . Displacement of intervertebral disc, site unspecified, without myelopathy   . GERD (gastroesophageal reflux disease)   . Heart murmur    " slight, [place] said nothing to worry about."  . History of kidney stones   . Hypertension     " no longer have hypertension since gastric bypass "  . Lumbago   . Obesity, unspecified   . Other, mixed, or unspecified nondependent drug abuse, unspecified   . Personal history of urinary calculi   . Pneumonia   . Priapism   . Stenosis, spinal, lumbar     Past Surgical History:  Procedure Laterality Date  . ABDOMINAL SURGERY    . ANKLE SURGERY    . ANTERIOR LAT LUMBAR FUSION Left 02/24/2016   Procedure: LEFT LUMBAR TWO-THREE  ANTERIOR LATERAL LUMBAR FUSION  ;  Surgeon: Earnie Larsson, MD;  Location: [place];  Service: Neurosurgery;  Laterality: Left;  . BACK SURGERY    . GASTRIC BYPASS    . HERNIA REPAIR    . INCISION AND DRAINAGE ABSCESS Right 07/06/2004   great toe  . IRRIGATION AND DEBRIDEMENT ABSCESS Right 07/08/2004   great toe  . KNEE SURGERY    . LUMBAR PERCUTANEOUS PEDICLE SCREW 1 LEVEL Left 02/24/2016   Procedure: LUMBAR PERCUTANEOUS PEDICLE SCREW 1 LEVEL;  Surgeon: Earnie Larsson, MD;  Location: [place];  Service: Neurosurgery;  Laterality: Left;  [facility] MICRODISCECTOMY LUMBAR Left 08/24/2008   T12 -  L1  . panectomy      Current Outpatient Medications  Medication Sig Dispense Refill Last Dose  . amLODipine (NORVASC) 2.5 MG tablet Take 1 tablet (2.5 mg total) by mouth daily. 90 tablet 3 Taking  . Cholecalciferol 2000 units TABS Take 1 tablet (2,000 Units total) by mouth daily. 90 tablet 3 Taking  . clindamycin (CLEOCIN) 300 MG capsule Take 1 capsule (300 mg total)  by mouth 3 (three) times daily for 7 days. 21 capsule 0   . ferrous sulfate 325 (65 FE) MG tablet Take 1 tablet (325 mg total) by mouth daily with breakfast. 90 tablet 3 Taking  . HYDROcodone-acetaminophen (NORCO) 10-325 MG tablet Take by mouth.   Taking  . Multiple Vitamin (MULTIVITAMIN) tablet Take 1 tablet by mouth daily.   Taking  . zinc gluconate 50 MG tablet Take 1 tablet (50 mg total) by mouth daily. 90 tablet 3 Taking   No current facility-administered medications for this visit.    Allergies  Allergen Reactions  . Codeine Hives    Hives and itching  . Percocet [Oxycodone-Acetaminophen] Hives    Hives and itching    Social History   Tobacco Use  . Smoking status: Never Smoker  . Smokeless tobacco: Never Used  Substance Use Topics  . Alcohol use: No    Family History  Problem Relation Age of Onset  . Arthritis Mother   . Heart disease Father   . Hypertension Father   . Kidney disease Father      Review of Systems  Constitutional: Negative.   HENT: Negative.   Eyes: Negative.   Respiratory: Negative.   Cardiovascular: Negative.   Gastrointestinal: Negative.   Genitourinary: Negative.   Musculoskeletal: Positive for back pain and joint pain.  Skin: Negative.   Neurological: Negative.   Endo/Heme/Allergies: Negative.   Psychiatric/Behavioral: Negative.     Objective:  Physical Exam  Vitals reviewed. Constitutional: He is oriented to person, place, and time. He appears well-developed and well-nourished.  HENT:  Head: Normocephalic and atraumatic.  Eyes: Conjunctivae and EOM are normal. Pupils are equal, round, and reactive to light.  Neck: Normal range of motion. Neck supple.  Cardiovascular: Normal rate and regular rhythm.  Respiratory: Effort normal. No respiratory distress.  GI: Soft. He exhibits no distension.  Genitourinary:  Genitourinary Comments: deferred  Musculoskeletal:       Right knee: He exhibits decreased range of motion, swelling,  effusion, abnormal alignment and bony tenderness. Tenderness found. Medial joint line and lateral joint line tenderness noted.  Neurological: He is alert and oriented to person, place, and time. He has normal reflexes.  Skin: Skin is warm and dry.  Psychiatric: He has a normal mood and affect. His behavior is normal. Judgment and thought content normal.    Vital signs in last 24 hours: @VSRANGES @  Labs:   Estimated body mass index is 32.53 kg/m as calculated from the following:   Height as of 04/09/17: 5' 11"  (1.803 m).   Weight as of 04/09/17: 105.8 kg (233 lb 4 oz).   Imaging Review Plain radiographs demonstrate severe degenerative joint disease of the right knee(s). The overall alignment issignificant varus. The bone quality appears to be adequate for age and reported activity level.  Assessment/Plan:  End stage arthritis, right knee   The patient history, physical examination, clinical judgment of the provider and imaging studies are consistent with end stage degenerative joint disease of the right knee(s) and total knee arthroplasty is deemed medically necessary. The treatment  options including medical management, injection therapy arthroscopy and arthroplasty were discussed at length. The risks and benefits of total knee arthroplasty were presented and reviewed. The risks due to aseptic loosening, infection, stiffness, patella tracking problems, thromboembolic complications and other imponderables were discussed. The patient acknowledged the explanation, agreed to proceed with the plan and consent was signed. Patient is being admitted for inpatient treatment for surgery, pain control, PT, OT, prophylactic antibiotics, VTE prophylaxis, progressive ambulation and ADL's and discharge planning. The patient is planning to be discharged home with outpatient PT at [facility].

## 2017-04-11 NOTE — H&P (Signed)
TOTAL KNEE ADMISSION H&P  Patient is being admitted for right total knee arthroplasty.  Subjective:  Chief Complaint:right knee pain.  HPI: Bradley [name], 47 y.o. male, has a history of pain and functional disability in the right knee due to arthritis and has failed non-surgical conservative treatments for greater than 12 weeks to includeNSAID's and/or analgesics, corticosteriod injections, viscosupplementation injections, flexibility and strengthening excercises, use of assistive devices, weight reduction as appropriate and activity modification.  Onset of symptoms was gradual, starting 10 years ago with gradually worsening course since that time. The patient noted prior procedures on the knee to include  arthroscopy on the right knee(s).  Patient currently rates pain in the right knee(s) at 10 out of 10 with activity. Patient has night pain, worsening of pain with activity and weight bearing, pain that interferes with activities of daily living, pain with passive range of motion, crepitus and joint swelling.  Patient has evidence of subchondral cysts, subchondral sclerosis, periarticular osteophytes and joint space narrowing by imaging studies. There is no active infection.  Patient Active Problem List   Diagnosis Date Noted  . Dental abscess 04/09/2017  . Streptococcal sore throat 09/26/2016  . Sore throat 09/26/2016  . Elevated LFTs 09/05/2016  . Degenerative spondylolisthesis 02/24/2016  . [facility] DJD(carpometacarpal degenerative joint disease), localized primary 10/04/2015  . Routine general medical examination at a health care facility 06/08/2015  . Cough variant asthma 02/06/2015  . Zinc deficiency 04/12/2014  . B12 deficiency anemia 04/08/2014  . Vitamin D deficiency 07/26/2012  . Gastric bypass status for obesity 07/25/2012  . Anemia, iron deficiency 07/25/2012  . NARCOTIC ABUSE 03/25/2009  . EXOGENOUS OBESITY 08/22/2007  . DJD (degenerative joint disease) of knee 03/12/2007  .  Essential hypertension 10/08/2006  . BACK PAIN, LUMBAR 10/08/2006   Past Medical History:  Diagnosis Date  . Anemia    since gastric bypass  . Anxiety   . Arthritis   . Arthropathy, unspecified, other specified sites   . Contact with or exposure to venereal diseases   . Depression   . Displacement of intervertebral disc, site unspecified, without myelopathy   . GERD (gastroesophageal reflux disease)   . Heart murmur    " slight, [place] said nothing to worry about."  . History of kidney stones   . Hypertension     " no longer have hypertension since gastric bypass "  . Lumbago   . Obesity, unspecified   . Other, mixed, or unspecified nondependent drug abuse, unspecified   . Personal history of urinary calculi   . Pneumonia   . Priapism   . Stenosis, spinal, lumbar     Past Surgical History:  Procedure Laterality Date  . ABDOMINAL SURGERY    . ANKLE SURGERY    . ANTERIOR LAT LUMBAR FUSION Left 02/24/2016   Procedure: LEFT LUMBAR TWO-THREE  ANTERIOR LATERAL LUMBAR FUSION  ;  Surgeon: Earnie Larsson, MD;  Location: [place];  Service: Neurosurgery;  Laterality: Left;  . BACK SURGERY    . GASTRIC BYPASS    . HERNIA REPAIR    . INCISION AND DRAINAGE ABSCESS Right 07/06/2004   great toe  . IRRIGATION AND DEBRIDEMENT ABSCESS Right 07/08/2004   great toe  . KNEE SURGERY    . LUMBAR PERCUTANEOUS PEDICLE SCREW 1 LEVEL Left 02/24/2016   Procedure: LUMBAR PERCUTANEOUS PEDICLE SCREW 1 LEVEL;  Surgeon: Earnie Larsson, MD;  Location: [place];  Service: Neurosurgery;  Laterality: Left;  Marland Kitchen MICRODISCECTOMY LUMBAR Left 08/24/2008   T12 -  L1  . panectomy      Current Outpatient Medications  Medication Sig Dispense Refill Last Dose  . amLODipine (NORVASC) 2.5 MG tablet Take 1 tablet (2.5 mg total) by mouth daily. 90 tablet 3 Taking  . Cholecalciferol 2000 units TABS Take 1 tablet (2,000 Units total) by mouth daily. 90 tablet 3 Taking  . clindamycin (CLEOCIN) 300 MG capsule Take 1 capsule (300 mg total)  by mouth 3 (three) times daily for 7 days. 21 capsule 0   . ferrous sulfate 325 (65 FE) MG tablet Take 1 tablet (325 mg total) by mouth daily with breakfast. 90 tablet 3 Taking  . HYDROcodone-acetaminophen (NORCO) 10-325 MG tablet Take by mouth.   Taking  . Multiple Vitamin (MULTIVITAMIN) tablet Take 1 tablet by mouth daily.   Taking  . zinc gluconate 50 MG tablet Take 1 tablet (50 mg total) by mouth daily. 90 tablet 3 Taking   No current facility-administered medications for this visit.    Allergies  Allergen Reactions  . Codeine Hives    Hives and itching  . Percocet [Oxycodone-Acetaminophen] Hives    Hives and itching    Social History   Tobacco Use  . Smoking status: Never Smoker  . Smokeless tobacco: Never Used  Substance Use Topics  . Alcohol use: No    Family History  Problem Relation Age of Onset  . Arthritis Mother   . Heart disease Father   . Hypertension Father   . Kidney disease Father      Review of Systems  Constitutional: Negative.   HENT: Negative.   Eyes: Negative.   Respiratory: Negative.   Cardiovascular: Negative.   Gastrointestinal: Negative.   Genitourinary: Negative.   Musculoskeletal: Positive for back pain and joint pain.  Skin: Negative.   Neurological: Negative.   Endo/Heme/Allergies: Negative.   Psychiatric/Behavioral: Negative.     Objective:  Physical Exam  Vitals reviewed. Constitutional: He is oriented to person, place, and time. He appears well-developed and well-nourished.  HENT:  Head: Normocephalic and atraumatic.  Eyes: Conjunctivae and EOM are normal. Pupils are equal, round, and reactive to light.  Neck: Normal range of motion. Neck supple.  Cardiovascular: Normal rate and regular rhythm.  Respiratory: Effort normal. No respiratory distress.  GI: Soft. He exhibits no distension.  Genitourinary:  Genitourinary Comments: deferred  Musculoskeletal:       Right knee: He exhibits decreased range of motion, swelling,  effusion, abnormal alignment and bony tenderness. Tenderness found. Medial joint line and lateral joint line tenderness noted.  Neurological: He is alert and oriented to person, place, and time. He has normal reflexes.  Skin: Skin is warm and dry.  Psychiatric: He has a normal mood and affect. His behavior is normal. Judgment and thought content normal.    Vital signs in last 24 hours: @VSRANGES @  Labs:   Estimated body mass index is 32.53 kg/m as calculated from the following:   Height as of 04/09/17: 5' 11"  (1.803 m).   Weight as of 04/09/17: 105.8 kg (233 lb 4 oz).   Imaging Review Plain radiographs demonstrate severe degenerative joint disease of the right knee(s). The overall alignment issignificant varus. The bone quality appears to be adequate for age and reported activity level.  Assessment/Plan:  End stage arthritis, right knee   The patient history, physical examination, clinical judgment of the provider and imaging studies are consistent with end stage degenerative joint disease of the right knee(s) and total knee arthroplasty is deemed medically necessary. The treatment  options including medical management, injection therapy arthroscopy and arthroplasty were discussed at length. The risks and benefits of total knee arthroplasty were presented and reviewed. The risks due to aseptic loosening, infection, stiffness, patella tracking problems, thromboembolic complications and other imponderables were discussed. The patient acknowledged the explanation, agreed to proceed with the plan and consent was signed. Patient is being admitted for inpatient treatment for surgery, pain control, PT, OT, prophylactic antibiotics, VTE prophylaxis, progressive ambulation and ADL's and discharge planning. The patient is planning to be discharged home with outpatient PT at [facility].

## 2017-04-19 ENCOUNTER — Other Ambulatory Visit (HOSPITAL_COMMUNITY): Payer: Self-pay | Admitting: Emergency Medicine

## 2017-04-19 NOTE — Progress Notes (Addendum)
[place] DR. PAULA ROSS 02-02-17 Epic/ AND ON CHART  SURGICAL CLEARANCE DR. [place] ON CHART   EKG 02-02-17 Epic

## 2017-04-19 NOTE — Patient Instructions (Addendum)
Bradley [name]  04/19/2017   Your procedure is scheduled on: 04-26-17   Report to [facility] Main  Entrance   Follow signs to Short Stay on first floor at 530 AM    Call this number if you have problems the morning of surgery [phone]     Remember: Do not eat food or drink liquids :After Midnight.     Take these medicines the morning of surgery with A SIP OF WATER: AMLODIPINE, OXYCODONE IR IF NEEDED                                 You may not have any metal on your body including hair pins and              piercings  Do not wear jewelry, make-up, lotions, powders or perfumes, deodorant               Men may shave face and neck.   Do not bring valuables to the hospital. [place].  Contacts, dentures or bridgework may not be worn into surgery.  Leave suitcase in the car. After surgery it may be brought to your room.                Please read over the following fact sheets you were given: _____________________________________________________________________             [facility] - Preparing for Surgery Before surgery, you can play an important role.  Because skin is not sterile, your skin needs to be as free of germs as possible.  You can reduce the number of germs on your skin by washing with CHG (chlorahexidine gluconate) soap before surgery.  CHG is an antiseptic cleaner which kills germs and bonds with the skin to continue killing germs even after washing. Please DO NOT use if you have an allergy to CHG or antibacterial soaps.  If your skin becomes reddened/irritated stop using the CHG and inform your nurse when you arrive at Short Stay. Do not shave (including legs and underarms) for at least 48 hours prior to the first CHG shower.  You may shave your face/neck. Please follow these instructions carefully:  1.  Shower with CHG Soap the night before surgery and the  morning of Surgery.  2.  If  you choose to wash your hair, wash your hair first as usual with your  normal  shampoo.  3.  After you shampoo, rinse your hair and body thoroughly to remove the  shampoo.                           4.  Use CHG as you would any other liquid soap.  You can apply chg directly  to the skin and wash                       Gently with a scrungie or clean washcloth.  5.  Apply the CHG Soap to your body ONLY FROM THE NECK DOWN.   Do not use on face/ open  Wound or open sores. Avoid contact with eyes, ears mouth and genitals (private parts).                       Wash face,  Genitals (private parts) with your normal soap.             6.  Wash thoroughly, paying special attention to the area where your surgery  will be performed.  7.  Thoroughly rinse your body with warm water from the neck down.  8.  DO NOT shower/wash with your normal soap after using and rinsing off  the CHG Soap.                9.  Pat yourself dry with a clean towel.            10.  Wear clean pajamas.            11.  Place clean sheets on your bed the night of your first shower and do not  sleep with pets. Day of Surgery : Do not apply any lotions/deodorants the morning of surgery.  Please wear clean clothes to the hospital/surgery center.  FAILURE TO FOLLOW THESE INSTRUCTIONS MAY RESULT IN THE CANCELLATION OF YOUR SURGERY PATIENT SIGNATURE_________________________________  NURSE SIGNATURE__________________________________  ________________________________________________________________________   Adam Phenix  An incentive spirometer is a tool that can help keep your lungs clear and active. This tool measures how well you are filling your lungs with each breath. Taking long deep breaths may help reverse or decrease the chance of developing breathing (pulmonary) problems (especially infection) following:  A long period of time when you are unable to move or be active. BEFORE THE PROCEDURE    If the spirometer includes an indicator to show your best effort, your nurse or respiratory therapist will set it to a desired goal.  If possible, sit up straight or lean slightly forward. Try not to slouch.  Hold the incentive spirometer in an upright position. INSTRUCTIONS FOR USE  1. Sit on the edge of your bed if possible, or sit up as far as you can in bed or on a chair. 2. Hold the incentive spirometer in an upright position. 3. Breathe out normally. 4. Place the mouthpiece in your mouth and seal your lips tightly around it. 5. Breathe in slowly and as deeply as possible, raising the piston or the ball toward the top of the column. 6. Hold your breath for 3-5 seconds or for as long as possible. Allow the piston or ball to fall to the bottom of the column. 7. Remove the mouthpiece from your mouth and breathe out normally. 8. Rest for a few seconds and repeat Steps 1 through 7 at least 10 times every 1-2 hours when you are awake. Take your time and take a few normal breaths between deep breaths. 9. The spirometer may include an indicator to show your best effort. Use the indicator as a goal to work toward during each repetition. 10. After each set of 10 deep breaths, practice coughing to be sure your lungs are clear. If you have an incision (the cut made at the time of surgery), support your incision when coughing by placing a pillow or rolled up towels firmly against it. Once you are able to get out of bed, walk around indoors and cough well. You may stop using the incentive spirometer when instructed by your caregiver.  RISKS AND COMPLICATIONS  Take your time so you do not get  dizzy or light-headed.  If you are in pain, you may need to take or ask for pain medication before doing incentive spirometry. It is harder to take a deep breath if you are having pain. AFTER USE  Rest and breathe slowly and easily.  It can be helpful to keep track of a log of your progress. Your caregiver  can provide you with a simple table to help with this. If you are using the spirometer at home, follow these instructions: [place] IF:   You are having difficultly using the spirometer.  You have trouble using the spirometer as often as instructed.  Your pain medication is not giving enough relief while using the spirometer.  You develop fever of 100.5 F (38.1 C) or higher. SEEK IMMEDIATE MEDICAL CARE IF:   You cough up bloody sputum that had not been present before.  You develop fever of 102 F (38.9 C) or greater.  You develop worsening pain at or near the incision site. MAKE SURE YOU:   Understand these instructions.  Will watch your condition.  Will get help right away if you are not doing well or get worse. Document Released: 07/03/2006 Document Revised: 05/15/2011 Document Reviewed: 09/03/2006 Exit[employer] Patient Information 2014 [employer], [place].   ________________________________________________________________________  WHAT IS A BLOOD TRANSFUSION? Blood Transfusion Information  A transfusion is the replacement of blood or some of its parts. Blood is made up of multiple cells which provide different functions.  Red blood cells carry oxygen and are used for blood loss replacement.  White blood cells fight against infection.  Platelets control bleeding.  Plasma helps clot blood.  Other blood products are available for specialized needs, such as hemophilia or other clotting disorders. BEFORE THE TRANSFUSION  Who gives blood for transfusions?   Healthy volunteers who are fully evaluated to make sure their blood is safe. This is blood bank blood. Transfusion therapy is the safest it has ever been in the practice of medicine. Before blood is taken from a donor, a complete history is taken to make sure that person has no history of diseases nor engages in risky social behavior (examples are intravenous drug use or sexual activity with multiple partners). The  donor's travel history is screened to minimize risk of transmitting infections, such as malaria. The donated blood is tested for signs of infectious diseases, such as HIV and hepatitis. The blood is then tested to be sure it is compatible with you in order to minimize the chance of a transfusion reaction. If you or a relative donates blood, this is often done in anticipation of surgery and is not appropriate for emergency situations. It takes many days to process the donated blood. RISKS AND COMPLICATIONS Although transfusion therapy is very safe and saves many lives, the main dangers of transfusion include:   Getting an infectious disease.  Developing a transfusion reaction. This is an allergic reaction to something in the blood you were given. Every precaution is taken to prevent this. The decision to have a blood transfusion has been considered carefully by your caregiver before blood is given. Blood is not given unless the benefits outweigh the risks. AFTER THE TRANSFUSION  Right after receiving a blood transfusion, you will usually feel much better and more energetic. This is especially true if your red blood cells have gotten low (anemic). The transfusion raises the level of the red blood cells which carry oxygen, and this usually causes an energy increase.  The nurse administering the transfusion will  monitor you carefully for complications. HOME CARE INSTRUCTIONS  No special instructions are needed after a transfusion. You may find your energy is better. Speak with your caregiver about any limitations on activity for underlying diseases you may have. SEEK MEDICAL CARE IF:   Your condition is not improving after your transfusion.  You develop redness or irritation at the intravenous (IV) site. SEEK IMMEDIATE MEDICAL CARE IF:  Any of the following symptoms occur over the next 12 hours:  Shaking chills.  You have a temperature by mouth above 102 F (38.9 C), not controlled by  medicine.  Chest, back, or muscle pain.  People around you feel you are not acting correctly or are confused.  Shortness of breath or difficulty breathing.  Dizziness and fainting.  You get a rash or develop hives.  You have a decrease in urine output.  Your urine turns a dark color or changes to pink, red, or brown. Any of the following symptoms occur over the next 10 days:  You have a temperature by mouth above 102 F (38.9 C), not controlled by medicine.  Shortness of breath.  Weakness after normal activity.  The white part of the eye turns yellow (jaundice).  You have a decrease in the amount of urine or are urinating less often.  Your urine turns a dark color or changes to pink, red, or brown. Document Released: 02/18/2000 Document Revised: 05/15/2011 Document Reviewed: 10/07/2007 [facility] Patient Information 2014 [place], [place].  _______________________________________________________________________

## 2017-04-20 ENCOUNTER — Encounter (HOSPITAL_COMMUNITY): Payer: Self-pay

## 2017-04-20 ENCOUNTER — Other Ambulatory Visit: Payer: Self-pay

## 2017-04-20 ENCOUNTER — Encounter (HOSPITAL_COMMUNITY)
Admission: RE | Admit: 2017-04-20 | Discharge: 2017-04-20 | Disposition: A | Discharge status: Home / Self Care | Payer: BLUE CROSS/BLUE SHIELD | Source: Ambulatory Visit | Attending: Orthopedic Surgery | Admitting: Orthopedic Surgery

## 2017-04-20 DIAGNOSIS — Z01812 Encounter for preprocedural laboratory examination: Secondary | ICD-10-CM | POA: Insufficient documentation

## 2017-04-20 HISTORY — DX: Other nonrheumatic aortic valve disorders: I35.8

## 2017-04-20 HISTORY — DX: Atherosclerotic heart disease of native coronary artery without angina pectoris: I25.10

## 2017-04-20 LAB — CBC
HCT: 38.6 % — ABNORMAL LOW (ref 39.0–52.0)
Hemoglobin: 12.7 g/dL — ABNORMAL LOW (ref 13.0–17.0)
MCH: 25.8 pg — ABNORMAL LOW (ref 26.0–34.0)
MCHC: 32.9 g/dL (ref 30.0–36.0)
MCV: 78.3 fL (ref 78.0–100.0)
Platelets: 315 10*3/uL (ref 150–400)
RBC: 4.93 MIL/uL (ref 4.22–5.81)
RDW: 14.7 % (ref 11.5–15.5)
WBC: 6.5 10*3/uL (ref 4.0–10.5)

## 2017-04-20 LAB — BASIC METABOLIC PANEL
Anion gap: 6 (ref 5–15)
BUN: 9 mg/dL (ref 6–20)
CO2: 25 mmol/L (ref 22–32)
Calcium: 8.7 mg/dL — ABNORMAL LOW (ref 8.9–10.3)
Chloride: 107 mmol/L (ref 101–111)
Creatinine, Ser: 0.73 mg/dL (ref 0.61–1.24)
GFR calc Af Amer: >60 mL/min (ref >60)
GFR calc non Af Amer: >60 mL/min (ref >60)
Glucose, Bld: 106 mg/dL — ABNORMAL HIGH (ref 65–99)
Potassium: 4.5 mmol/L (ref 3.5–5.1)
Sodium: 138 mmol/L (ref 135–145)

## 2017-04-20 LAB — SURGICAL PCR SCREEN
MRSA, PCR: NEGATIVE
Staphylococcus aureus: NEGATIVE

## 2017-04-20 NOTE — Progress Notes (Signed)
PATIENT PRESENTS TO PAT APPT REPORTING  THAT HE AND HIS WIFE (PRESENT) FLEW IN TO [place] THIS MORNING AROUND 1155AM; AND HAD THEIR DAUGTHER DRIVE THEM  FROM [place] TO THIS APPT; AND ONLY JUST TOOK HIS BLOOD PRESSURE MEDICINE BEFORE THIS APPT; HE REPORTS IT NORMALLY RUNS 120s , 130s over 82s, 66s WHEN HE CHECKS IT AT HOME. RN ADVISED PATIENT TO CHECK BP WHEN HE RETURNS HOME AFTER RESTING FOR 15 MINUTES AND THROUGHOUT THE WEEKEND; IF BPs CONSISTENTLY HIGH, CONTACT PRIMARY CARE DOCTOR TO REPORT.  PATIENT VERBALIZED UNDERSTANDING .

## 2017-04-21 LAB — ABO/RH: ABO/RH(D): A POS

## 2017-04-24 ENCOUNTER — Encounter: Payer: Self-pay | Admitting: Internal Medicine

## 2017-04-24 ENCOUNTER — Other Ambulatory Visit (HOSPITAL_COMMUNITY): Payer: BLUE CROSS/BLUE SHIELD

## 2017-04-24 ENCOUNTER — Other Ambulatory Visit (INDEPENDENT_AMBULATORY_CARE_PROVIDER_SITE_OTHER): Payer: BLUE CROSS/BLUE SHIELD

## 2017-04-24 ENCOUNTER — Ambulatory Visit (INDEPENDENT_AMBULATORY_CARE_PROVIDER_SITE_OTHER): Payer: BLUE CROSS/BLUE SHIELD | Admitting: Internal Medicine

## 2017-04-24 VITALS — BP 176/116 | HR 74 | Temp 97.9°F | Resp 16 | Ht 70.0 in | Wt 233.1 lb

## 2017-04-24 DIAGNOSIS — E559 Vitamin D deficiency, unspecified: Secondary | ICD-10-CM

## 2017-04-24 DIAGNOSIS — I10 Essential (primary) hypertension: Secondary | ICD-10-CM

## 2017-04-24 LAB — URINALYSIS, ROUTINE W REFLEX MICROSCOPIC
Bilirubin Urine: NEGATIVE
Ketones, ur: NEGATIVE
Leukocytes, UA: NEGATIVE
Nitrite: NEGATIVE
Specific Gravity, Urine: >=1.03 — AB (ref 1.000–1.030)
Total Protein, Urine: NEGATIVE
Urine Glucose: NEGATIVE
Urobilinogen, UA: 0.2 (ref 0.0–1.0)
pH: 6 (ref 5.0–8.0)

## 2017-04-24 LAB — BASIC METABOLIC PANEL
BUN: 9 mg/dL (ref 6–23)
CO2: 25 mEq/L (ref 19–32)
Calcium: 8.8 mg/dL (ref 8.4–10.5)
Chloride: 106 mEq/L (ref 96–112)
Creatinine, Ser: 0.91 mg/dL (ref 0.40–1.50)
GFR: 94.28 mL/min (ref >60.00)
Glucose, Bld: 91 mg/dL (ref 70–99)
Potassium: 4.8 mEq/L (ref 3.5–5.1)
Sodium: 139 mEq/L (ref 135–145)

## 2017-04-24 LAB — VITAMIN D 25 HYDROXY (VIT D DEFICIENCY, FRACTURES): VITD: 24.58 ng/mL — ABNORMAL LOW (ref 30.00–100.00)

## 2017-04-24 MED ORDER — AZILSARTAN-CHLORTHALIDONE 40-25 MG PO TABS: 1.0000 | ORAL_TABLET | Freq: Every day | ORAL | 0 refills | Status: DC

## 2017-04-24 MED ORDER — CHOLECALCIFEROL 50 MCG (2000 UT) PO TABS: 1.0000 | ORAL_TABLET | Freq: Every day | ORAL | 1 refills | Status: DC

## 2017-04-24 NOTE — Progress Notes (Signed)
Subjective:  Patient ID: Bradley [name], male    DOB: 1970-09-01  Age: 47 y.o. MRN: [id number]  CC: Hypertension   HPI Bradley [name] presents for concerns about an elevated blood pressure.  He saw a medical provider about a week ago and was found to have a blood pressure of 170/117.  He has had a few headaches recently.  He denies blurred vision, chest pain, or shortness of breath.  He does not take decongestants or anti-inflammatories.  He rarely drinks alcohol.  He denies abdominal pain, dysuria, hematuria, or peripheral edema.  No facility-administered medications prior to visit.    Outpatient Medications Prior to Visit  Medication Sig Dispense Refill  . Multiple Vitamin (MULTIVITAMIN) tablet Take 1 tablet by mouth daily.    Marland Kitchen oxycodone (OXY-IR) 5 MG capsule Take 10 mg by mouth every 4 (four) hours as needed for pain.    Marland Kitchen zinc gluconate 50 MG tablet Take 1 tablet (50 mg total) by mouth daily. 90 tablet 3  . amLODipine (NORVASC) 2.5 MG tablet Take 1 tablet (2.5 mg total) by mouth daily. (Patient taking differently: Take 5 mg by mouth daily. ) 90 tablet 3    ROS Review of Systems  Constitutional: Negative for chills, diaphoresis, fatigue and fever.  HENT: Negative.  Negative for trouble swallowing.   Eyes: Negative for visual disturbance.  Respiratory: Negative for apnea, cough, chest tightness, shortness of breath and wheezing.   Cardiovascular: Negative for chest pain, palpitations and leg swelling.  Gastrointestinal: Negative for abdominal pain, constipation, diarrhea, nausea and vomiting.  Endocrine: Negative.   Genitourinary: Negative for difficulty urinating, dysuria, hematuria and urgency.  Musculoskeletal: Negative.  Negative for arthralgias, myalgias and neck pain.  Skin: Negative.  Negative for color change and pallor.  Allergic/Immunologic: Negative.   Neurological: Positive for headaches. Negative for dizziness, weakness and light-headedness.  Hematological:  Negative for adenopathy. Does not bruise/bleed easily.  Psychiatric/Behavioral: Negative.     Objective:  BP (!) 176/116 (BP Location: Right Arm, Patient Position: Sitting, Cuff Size: Large)   Pulse 74   Temp 97.9 F (36.6 C) (Oral)   Resp 16   Ht 5' 10"  (1.778 m)   Wt 233 lb 1.9 oz (105.7 kg)   SpO2 99%   BMI 33.45 kg/m   BP Readings from Last 3 Encounters:  04/26/17 119/74  04/25/17 120/78  04/24/17 (!) 176/116    Wt Readings from Last 3 Encounters:  04/26/17 233 lb (105.7 kg)  04/24/17 233 lb 1.9 oz (105.7 kg)  04/20/17 234 lb (106.1 kg)    Physical Exam  Constitutional: He is oriented to person, place, and time. No distress.  HENT:  Mouth/Throat: Oropharynx is clear and moist. No oropharyngeal exudate.  Eyes: Conjunctivae are normal. Left eye exhibits no discharge. No scleral icterus.  Neck: Normal range of motion. Neck supple. No JVD present. No thyromegaly present.  Cardiovascular: Normal rate, regular rhythm and normal heart sounds. Exam reveals no gallop.  No murmur heard. Pulmonary/Chest: Effort normal and breath sounds normal. No respiratory distress. He has no wheezes. He has no rales.  Abdominal: Soft. Bowel sounds are normal. He exhibits no distension and no mass. There is no tenderness.  Musculoskeletal: Normal range of motion. He exhibits no edema, tenderness or deformity.  Lymphadenopathy:    He has no cervical adenopathy.  Neurological: He is alert and oriented to person, place, and time.  Skin: Skin is warm and dry. No rash noted. He is not diaphoretic. No erythema.  No pallor.  Vitals reviewed.   Lab Results  Component Value Date   WBC 6.5 04/20/2017   HGB 12.7 (L) 04/20/2017   HCT 38.6 (L) 04/20/2017   PLT 315 04/20/2017   GLUCOSE 91 04/24/2017   CHOL 137 09/05/2016   TRIG 133.0 09/05/2016   HDL 43.90 09/05/2016   LDLCALC 67 09/05/2016   ALT 16 09/05/2016   AST 15 09/05/2016   NA 139 04/24/2017   K 4.8 04/24/2017   CL 106 04/24/2017    CREATININE 0.91 04/24/2017   BUN 9 04/24/2017   CO2 25 04/24/2017   TSH 1.92 04/24/2017   HGBA1C 4.9 10/05/2009    No results found.  Assessment & Plan:   Bradley [name] was seen today for hypertension.  Diagnoses and all orders for this visit:  Essential hypertension- His blood pressure is not adequately well controlled.  His lab work shows mild hematuria but is otherwise unremarkable.  Will treat the vitamin D deficiency.  His urine drug screen is negative for stimulants.  Will start treating this with an ARB/thiazide diuretic combination. -     Urine drugs of abuse scrn w alc, routine (Ref Lab); Future -     Basic metabolic panel; Future -     Thyroid Panel With TSH; Future -     Urinalysis, Routine w reflex microscopic; Future -     VITAMIN D 25 Hydroxy (Vit-D Deficiency, Fractures); Future -     Azilsartan-Chlorthalidone (EDARBYCLOR) 40-25 MG TABS; Take 1 tablet by mouth daily.  Vitamin D deficiency -     VITAMIN D 25 Hydroxy (Vit-D Deficiency, Fractures); Future -     Cholecalciferol 2000 units TABS; Take 1 tablet (2,000 Units total) by mouth daily.   I have discontinued Bradley [name] amLODipine. I am also having him start on Azilsartan-Chlorthalidone and Cholecalciferol. Additionally, I am having him maintain his zinc gluconate, multivitamin, and oxycodone.  Meds ordered this encounter  Medications  . Azilsartan-Chlorthalidone (EDARBYCLOR) 40-25 MG TABS    Sig: Take 1 tablet by mouth daily.    Dispense:  35 tablet    Refill:  0  . Cholecalciferol 2000 units TABS    Sig: Take 1 tablet (2,000 Units total) by mouth daily.    Dispense:  90 tablet    Refill:  1     Follow-up: Return in about 3 weeks (around 05/15/2017).  [name], MD

## 2017-04-24 NOTE — Patient Instructions (Signed)

## 2017-04-25 ENCOUNTER — Encounter: Payer: Self-pay | Admitting: Internal Medicine

## 2017-04-25 ENCOUNTER — Ambulatory Visit: Payer: BLUE CROSS/BLUE SHIELD

## 2017-04-25 VITALS — BP 120/78

## 2017-04-25 DIAGNOSIS — I10 Essential (primary) hypertension: Secondary | ICD-10-CM

## 2017-04-25 LAB — URINE DRUGS OF ABUSE SCREEN W ALC, ROUTINE (REF LAB)
ALCOHOL, ETHYL (U): NEGATIVE
AMPHETAMINES (1000 ng/mL SCRN): NEGATIVE
BARBITURATES: NEGATIVE
BENZODIAZEPINES: NEGATIVE
COCAINE METABOLITES: NEGATIVE
MARIJUANA MET (50 ng/mL SCRN): NEGATIVE
METHADONE: NEGATIVE
METHAQUALONE: NEGATIVE
OPIATES: NEGATIVE
PHENCYCLIDINE: NEGATIVE
PROPOXYPHENE: NEGATIVE

## 2017-04-25 LAB — THYROID PANEL WITH TSH
Free Thyroxine Index: 3 (ref 1.4–3.8)
T3 Uptake: 34 % (ref 22–35)
T4, Total: 8.8 ug/dL (ref 4.9–10.5)
TSH: 1.92 mIU/L (ref 0.40–4.50)

## 2017-04-25 MED ORDER — TRANEXAMIC ACID 1000 MG/10ML IV SOLN
1000.0000 mg | INTRAVENOUS | Status: AC
  Administered 2017-04-26: 1000 mg via INTRAVENOUS
  Filled 2017-04-25: qty 1100

## 2017-04-26 ENCOUNTER — Inpatient Hospital Stay (HOSPITAL_COMMUNITY): Payer: BLUE CROSS/BLUE SHIELD | Admitting: Anesthesiology

## 2017-04-26 ENCOUNTER — Inpatient Hospital Stay (HOSPITAL_COMMUNITY): Payer: BLUE CROSS/BLUE SHIELD

## 2017-04-26 ENCOUNTER — Encounter (HOSPITAL_COMMUNITY): Payer: Self-pay | Admitting: Anesthesiology

## 2017-04-26 ENCOUNTER — Encounter (HOSPITAL_COMMUNITY)
Admission: RE | Disposition: A | Discharge status: Home / Self Care | Payer: Self-pay | Source: Ambulatory Visit | Attending: Orthopedic Surgery

## 2017-04-26 ENCOUNTER — Other Ambulatory Visit: Payer: Self-pay

## 2017-04-26 ENCOUNTER — Inpatient Hospital Stay (HOSPITAL_COMMUNITY)
Admission: RE | Admit: 2017-04-26 | Discharge: 2017-04-27 | DRG: 470 | Disposition: A | Discharge status: Home / Self Care | Payer: BLUE CROSS/BLUE SHIELD | Source: Ambulatory Visit | Attending: Orthopedic Surgery | Admitting: Orthopedic Surgery

## 2017-04-26 DIAGNOSIS — I358 Other nonrheumatic aortic valve disorders: Secondary | ICD-10-CM | POA: Diagnosis present

## 2017-04-26 DIAGNOSIS — K219 Gastro-esophageal reflux disease without esophagitis: Secondary | ICD-10-CM | POA: Diagnosis present

## 2017-04-26 DIAGNOSIS — M1711 Unilateral primary osteoarthritis, right knee: Principal | ICD-10-CM

## 2017-04-26 DIAGNOSIS — Z885 Allergy status to narcotic agent status: Secondary | ICD-10-CM | POA: Diagnosis not present

## 2017-04-26 DIAGNOSIS — Z96651 Presence of right artificial knee joint: Secondary | ICD-10-CM

## 2017-04-26 DIAGNOSIS — D519 Vitamin B12 deficiency anemia, unspecified: Secondary | ICD-10-CM | POA: Diagnosis present

## 2017-04-26 DIAGNOSIS — Z87442 Personal history of urinary calculi: Secondary | ICD-10-CM | POA: Diagnosis not present

## 2017-04-26 DIAGNOSIS — Z8249 Family history of ischemic heart disease and other diseases of the circulatory system: Secondary | ICD-10-CM

## 2017-04-26 DIAGNOSIS — R011 Cardiac murmur, unspecified: Secondary | ICD-10-CM | POA: Diagnosis present

## 2017-04-26 DIAGNOSIS — I2510 Atherosclerotic heart disease of native coronary artery without angina pectoris: Secondary | ICD-10-CM | POA: Diagnosis present

## 2017-04-26 DIAGNOSIS — Z841 Family history of disorders of kidney and ureter: Secondary | ICD-10-CM | POA: Diagnosis not present

## 2017-04-26 DIAGNOSIS — G8918 Other acute postprocedural pain: Secondary | ICD-10-CM | POA: Diagnosis not present

## 2017-04-26 DIAGNOSIS — Z8261 Family history of arthritis: Secondary | ICD-10-CM

## 2017-04-26 DIAGNOSIS — M1712 Unilateral primary osteoarthritis, left knee: Secondary | ICD-10-CM | POA: Diagnosis present

## 2017-04-26 DIAGNOSIS — Z9884 Bariatric surgery status: Secondary | ICD-10-CM

## 2017-04-26 DIAGNOSIS — Z471 Aftercare following joint replacement surgery: Secondary | ICD-10-CM | POA: Diagnosis not present

## 2017-04-26 DIAGNOSIS — M25561 Pain in right knee: Secondary | ICD-10-CM | POA: Diagnosis not present

## 2017-04-26 DIAGNOSIS — Z981 Arthrodesis status: Secondary | ICD-10-CM | POA: Diagnosis not present

## 2017-04-26 DIAGNOSIS — I10 Essential (primary) hypertension: Secondary | ICD-10-CM | POA: Diagnosis present

## 2017-04-26 DIAGNOSIS — D649 Anemia, unspecified: Secondary | ICD-10-CM | POA: Diagnosis present

## 2017-04-26 DIAGNOSIS — J45909 Unspecified asthma, uncomplicated: Secondary | ICD-10-CM | POA: Diagnosis not present

## 2017-04-26 HISTORY — PX: KNEE ARTHROPLASTY: SHX992

## 2017-04-26 LAB — TYPE AND SCREEN
ABO/RH(D): A POS
Antibody Screen: NEGATIVE

## 2017-04-26 SURGERY — ARTHROPLASTY, KNEE, TOTAL, USING IMAGELESS COMPUTER-ASSISTED NAVIGATION
Anesthesia: Spinal | Site: Knee | Laterality: Right

## 2017-04-26 MED ORDER — MIDAZOLAM HCL 2 MG/2ML IJ SOLN
INTRAMUSCULAR | Status: AC
  Filled 2017-04-26: qty 2

## 2017-04-26 MED ORDER — OXYCODONE HCL 5 MG PO TABS
10.0000 mg | ORAL_TABLET | ORAL | Status: DC | PRN
  Administered 2017-04-26: 14:00:00 10 mg via ORAL
  Filled 2017-04-26: qty 2

## 2017-04-26 MED ORDER — HYDROMORPHONE HCL 1 MG/ML IJ SOLN
0.5000 mg | INTRAMUSCULAR | Status: DC | PRN
  Administered 2017-04-26 – 2017-04-27 (×3): 1 mg via INTRAVENOUS
  Filled 2017-04-26 (×3): qty 1

## 2017-04-26 MED ORDER — FENTANYL CITRATE (PF) 100 MCG/2ML IJ SOLN
INTRAMUSCULAR | Status: DC | PRN
  Administered 2017-04-26: 50 ug via INTRAVENOUS
  Administered 2017-04-26 (×2): 25 ug via INTRAVENOUS

## 2017-04-26 MED ORDER — BUPIVACAINE-EPINEPHRINE 0.25% -1:200000 IJ SOLN
INTRAMUSCULAR | Status: AC
  Filled 2017-04-26: qty 1

## 2017-04-26 MED ORDER — PHENYLEPHRINE 40 MCG/ML (10ML) SYRINGE FOR IV PUSH (FOR BLOOD PRESSURE SUPPORT)
PREFILLED_SYRINGE | INTRAVENOUS | Status: AC
  Filled 2017-04-26: qty 10

## 2017-04-26 MED ORDER — PHENOL 1.4 % MT LIQD: 1.0000 | OROMUCOSAL | Status: DC | PRN

## 2017-04-26 MED ORDER — SODIUM CHLORIDE 0.9 % IV SOLN: INTRAVENOUS | Status: DC

## 2017-04-26 MED ORDER — ONDANSETRON HCL 4 MG/2ML IJ SOLN
INTRAMUSCULAR | Status: DC | PRN
  Administered 2017-04-26: 4 mg via INTRAVENOUS

## 2017-04-26 MED ORDER — PROPOFOL 10 MG/ML IV BOLUS
INTRAVENOUS | Status: AC
  Filled 2017-04-26: qty 40

## 2017-04-26 MED ORDER — PROPOFOL 500 MG/50ML IV EMUL
INTRAVENOUS | Status: DC | PRN
  Administered 2017-04-26: 100 ug/kg/min via INTRAVENOUS

## 2017-04-26 MED ORDER — ISOPROPYL ALCOHOL 70 % SOLN
Status: DC | PRN
  Administered 2017-04-26: 1 via TOPICAL

## 2017-04-26 MED ORDER — POVIDONE-IODINE 10 % EX SWAB
2.0000 "application " | Freq: Once | CUTANEOUS | Status: AC
  Administered 2017-04-26: 2 via TOPICAL

## 2017-04-26 MED ORDER — PHENYLEPHRINE HCL 10 MG/ML IJ SOLN
INTRAVENOUS | Status: DC | PRN
  Administered 2017-04-26: 25 ug/min via INTRAVENOUS

## 2017-04-26 MED ORDER — DEXAMETHASONE SODIUM PHOSPHATE 10 MG/ML IJ SOLN
INTRAMUSCULAR | Status: AC
  Filled 2017-04-26: qty 2

## 2017-04-26 MED ORDER — PROPOFOL 10 MG/ML IV BOLUS
INTRAVENOUS | Status: AC
  Filled 2017-04-26: qty 20

## 2017-04-26 MED ORDER — POLYVINYL ALCOHOL 1.4 % OP SOLN
1.0000 [drp] | Freq: Once | OPHTHALMIC | Status: DC
  Filled 2017-04-26: qty 15

## 2017-04-26 MED ORDER — HYDROMORPHONE HCL 1 MG/ML IJ SOLN
0.2500 mg | INTRAMUSCULAR | Status: DC | PRN
  Administered 2017-04-26 (×4): 0.5 mg via INTRAVENOUS

## 2017-04-26 MED ORDER — ROPIVACAINE HCL 7.5 MG/ML IJ SOLN
INTRAMUSCULAR | Status: DC | PRN
  Administered 2017-04-26: 20 mL via PERINEURAL

## 2017-04-26 MED ORDER — POLYETHYLENE GLYCOL 3350 17 G PO PACK: 17.0000 g | PACK | Freq: Every day | ORAL | Status: DC | PRN

## 2017-04-26 MED ORDER — DIPHENHYDRAMINE HCL 12.5 MG/5ML PO ELIX: 12.5000 mg | ORAL_SOLUTION | ORAL | Status: DC | PRN

## 2017-04-26 MED ORDER — DEXAMETHASONE SODIUM PHOSPHATE 10 MG/ML IJ SOLN
10.0000 mg | Freq: Once | INTRAMUSCULAR | Status: AC
  Administered 2017-04-27: 10 mg via INTRAVENOUS
  Filled 2017-04-26: qty 1

## 2017-04-26 MED ORDER — PROMETHAZINE HCL 25 MG/ML IJ SOLN: 6.2500 mg | INTRAMUSCULAR | Status: DC | PRN

## 2017-04-26 MED ORDER — HYPROMELLOSE (GONIOSCOPIC) 2.5 % OP SOLN: 1.0000 [drp] | OPHTHALMIC | Status: DC | PRN

## 2017-04-26 MED ORDER — ASPIRIN 81 MG PO CHEW
81.0000 mg | CHEWABLE_TABLET | Freq: Two times a day (BID) | ORAL | Status: DC
  Administered 2017-04-26 – 2017-04-27 (×2): 81 mg via ORAL
  Filled 2017-04-26 (×2): qty 1

## 2017-04-26 MED ORDER — PHENYLEPHRINE 40 MCG/ML (10ML) SYRINGE FOR IV PUSH (FOR BLOOD PRESSURE SUPPORT)
PREFILLED_SYRINGE | INTRAVENOUS | Status: DC | PRN
  Administered 2017-04-26 (×5): 80 ug via INTRAVENOUS

## 2017-04-26 MED ORDER — SODIUM CHLORIDE 0.9 % IV SOLN
INTRAVENOUS | Status: DC
  Administered 2017-04-26 (×2): via INTRAVENOUS

## 2017-04-26 MED ORDER — HYDROMORPHONE HCL 1 MG/ML IJ SOLN
INTRAMUSCULAR | Status: AC
  Filled 2017-04-26: qty 2

## 2017-04-26 MED ORDER — OXYCODONE HCL 5 MG PO TABS
15.0000 mg | ORAL_TABLET | ORAL | Status: DC | PRN
  Administered 2017-04-26 – 2017-04-27 (×7): 15 mg via ORAL
  Filled 2017-04-26 (×7): qty 3

## 2017-04-26 MED ORDER — ONDANSETRON HCL 4 MG/2ML IJ SOLN
INTRAMUSCULAR | Status: AC
  Filled 2017-04-26: qty 4

## 2017-04-26 MED ORDER — BUPIVACAINE-EPINEPHRINE 0.25% -1:200000 IJ SOLN
INTRAMUSCULAR | Status: DC | PRN
  Administered 2017-04-26: 30 mL

## 2017-04-26 MED ORDER — AZILSARTAN-CHLORTHALIDONE 40-25 MG PO TABS: 1.0000 | ORAL_TABLET | Freq: Every day | ORAL | Status: DC

## 2017-04-26 MED ORDER — SODIUM CHLORIDE 0.9 % IJ SOLN
INTRAMUSCULAR | Status: DC | PRN
  Administered 2017-04-26: 30 mL

## 2017-04-26 MED ORDER — FENTANYL CITRATE (PF) 100 MCG/2ML IJ SOLN
INTRAMUSCULAR | Status: AC
Start: 2017-04-26 — End: ?
  Filled 2017-04-26: qty 2

## 2017-04-26 MED ORDER — IRBESARTAN 150 MG PO TABS: 300.0000 mg | ORAL_TABLET | Freq: Every day | ORAL | Status: DC

## 2017-04-26 MED ORDER — DEXTROSE 5 % IV SOLN
500.0000 mg | Freq: Four times a day (QID) | INTRAVENOUS | Status: DC | PRN
  Administered 2017-04-26: 500 mg via INTRAVENOUS
  Filled 2017-04-26: qty 550

## 2017-04-26 MED ORDER — ACETAMINOPHEN 10 MG/ML IV SOLN
1000.0000 mg | INTRAVENOUS | Status: AC
  Administered 2017-04-26: 1000 mg via INTRAVENOUS
  Filled 2017-04-26: qty 100

## 2017-04-26 MED ORDER — CHLORTHALIDONE 25 MG PO TABS: 25.0000 mg | ORAL_TABLET | Freq: Every day | ORAL | Status: DC

## 2017-04-26 MED ORDER — CEFAZOLIN SODIUM-DEXTROSE 2-4 GM/100ML-% IV SOLN
2.0000 g | INTRAVENOUS | Status: AC
Start: 2017-04-26 — End: 2017-04-26
  Administered 2017-04-26: 2 g via INTRAVENOUS
  Filled 2017-04-26: qty 100

## 2017-04-26 MED ORDER — LACTATED RINGERS IV SOLN
INTRAVENOUS | Status: DC | PRN
  Administered 2017-04-26 (×2): via INTRAVENOUS

## 2017-04-26 MED ORDER — STERILE WATER FOR IRRIGATION IR SOLN
Status: DC | PRN
  Administered 2017-04-26: 2000 mL

## 2017-04-26 MED ORDER — KETOROLAC TROMETHAMINE 30 MG/ML IJ SOLN
INTRAMUSCULAR | Status: AC
  Filled 2017-04-26: qty 1

## 2017-04-26 MED ORDER — POLYVINYL ALCOHOL 1.4 % OP SOLN: 1.0000 [drp] | OPHTHALMIC | Status: DC | PRN

## 2017-04-26 MED ORDER — BUPIVACAINE IN DEXTROSE 0.75-8.25 % IT SOLN
INTRATHECAL | Status: DC | PRN
  Administered 2017-04-26: 1.8 mL via INTRATHECAL

## 2017-04-26 MED ORDER — SODIUM CHLORIDE 0.9 % IR SOLN
Status: DC | PRN
  Administered 2017-04-26: 1000 mL

## 2017-04-26 MED ORDER — MIDAZOLAM HCL 5 MG/5ML IJ SOLN
INTRAMUSCULAR | Status: DC | PRN
  Administered 2017-04-26: 2 mg via INTRAVENOUS
  Administered 2017-04-26 (×2): 1 mg via INTRAVENOUS

## 2017-04-26 MED ORDER — ONDANSETRON HCL 4 MG PO TABS: 4.0000 mg | ORAL_TABLET | Freq: Four times a day (QID) | ORAL | Status: DC | PRN

## 2017-04-26 MED ORDER — PHENYLEPHRINE HCL 10 MG/ML IJ SOLN
INTRAMUSCULAR | Status: AC
  Filled 2017-04-26: qty 1

## 2017-04-26 MED ORDER — MENTHOL 3 MG MT LOZG: 1.0000 | LOZENGE | OROMUCOSAL | Status: DC | PRN

## 2017-04-26 MED ORDER — SODIUM CHLORIDE 0.9 % IR SOLN
Status: DC | PRN
  Administered 2017-04-26: 2000 mL

## 2017-04-26 MED ORDER — DEXAMETHASONE SODIUM PHOSPHATE 10 MG/ML IJ SOLN
INTRAMUSCULAR | Status: DC | PRN
  Administered 2017-04-26: 10 mg via INTRAVENOUS

## 2017-04-26 MED ORDER — DOCUSATE SODIUM 100 MG PO CAPS: 100.0000 mg | ORAL_CAPSULE | Freq: Two times a day (BID) | ORAL | Status: DC

## 2017-04-26 MED ORDER — SODIUM CHLORIDE 0.9 % IJ SOLN
INTRAMUSCULAR | Status: AC
  Filled 2017-04-26: qty 50

## 2017-04-26 MED ORDER — VANCOMYCIN HCL IN DEXTROSE 1-5 GM/200ML-% IV SOLN
1000.0000 mg | Freq: Two times a day (BID) | INTRAVENOUS | Status: AC
  Administered 2017-04-26: 17:00:00 1000 mg via INTRAVENOUS
  Filled 2017-04-26: qty 200

## 2017-04-26 MED ORDER — ACETAMINOPHEN 325 MG PO TABS: 650.0000 mg | ORAL_TABLET | ORAL | Status: DC | PRN

## 2017-04-26 MED ORDER — SENNA 8.6 MG PO TABS: 2.0000 | ORAL_TABLET | Freq: Every day | ORAL | Status: DC

## 2017-04-26 MED ORDER — ACETAMINOPHEN 650 MG RE SUPP: 650.0000 mg | RECTAL | Status: DC | PRN

## 2017-04-26 MED ORDER — ALUM & MAG HYDROXIDE-SIMETH 200-200-20 MG/5ML PO SUSP: 30.0000 mL | ORAL | Status: DC | PRN

## 2017-04-26 MED ORDER — CHLORHEXIDINE GLUCONATE 4 % EX LIQD: 60.0000 mL | Freq: Once | CUTANEOUS | Status: DC

## 2017-04-26 MED ORDER — KETOROLAC TROMETHAMINE 30 MG/ML IJ SOLN
INTRAMUSCULAR | Status: DC | PRN
  Administered 2017-04-26: 30 mg

## 2017-04-26 MED ORDER — METOCLOPRAMIDE HCL 5 MG PO TABS: 5.0000 mg | ORAL_TABLET | Freq: Three times a day (TID) | ORAL | Status: DC | PRN

## 2017-04-26 MED ORDER — POLYVINYL ALCOHOL 1.4 % OP SOLN
1.0000 [drp] | OPHTHALMIC | Status: DC | PRN
  Administered 2017-04-26: 1 [drp] via OPHTHALMIC
  Filled 2017-04-26: qty 15

## 2017-04-26 MED ORDER — ONDANSETRON HCL 4 MG/2ML IJ SOLN: 4.0000 mg | Freq: Four times a day (QID) | INTRAMUSCULAR | Status: DC | PRN

## 2017-04-26 MED ORDER — METHOCARBAMOL 500 MG PO TABS
500.0000 mg | ORAL_TABLET | Freq: Four times a day (QID) | ORAL | Status: DC | PRN
  Administered 2017-04-26 – 2017-04-27 (×4): 500 mg via ORAL
  Filled 2017-04-26 (×4): qty 1

## 2017-04-26 MED ORDER — KETOROLAC TROMETHAMINE 15 MG/ML IJ SOLN
15.0000 mg | Freq: Four times a day (QID) | INTRAMUSCULAR | Status: AC
  Administered 2017-04-26 – 2017-04-27 (×4): 15 mg via INTRAVENOUS
  Filled 2017-04-26 (×4): qty 1

## 2017-04-26 MED ORDER — PROPOFOL 10 MG/ML IV BOLUS
INTRAVENOUS | Status: AC
  Filled 2017-04-26: qty 60

## 2017-04-26 MED ORDER — ISOPROPYL ALCOHOL 70 % SOLN
Status: AC
  Filled 2017-04-26: qty 480

## 2017-04-26 MED ORDER — METOCLOPRAMIDE HCL 5 MG/ML IJ SOLN: 5.0000 mg | Freq: Three times a day (TID) | INTRAMUSCULAR | Status: DC | PRN

## 2017-04-26 SURGICAL SUPPLY — 60 items
ADH SKN CLS APL DERMABOND .7 (GAUZE/BANDAGES/DRESSINGS) ×2
BAG SPEC THK2 15X12 ZIP CLS (MISCELLANEOUS) ×1
BAG ZIPLOCK 12X15 (MISCELLANEOUS) ×2 IMPLANT
BANDAGE ACE 4X5 VEL STRL LF (GAUZE/BANDAGES/DRESSINGS) ×3 IMPLANT
BANDAGE ACE 6X5 VEL STRL LF (GAUZE/BANDAGES/DRESSINGS) ×3 IMPLANT
BLADE SAW RECIPROCATING 77.5 (BLADE) ×3 IMPLANT
CAPT KNEE TRIATH TK-4 ×2 IMPLANT
CHLORAPREP W/TINT 26ML (MISCELLANEOUS) ×6 IMPLANT
COVER SURGICAL LIGHT HANDLE (MISCELLANEOUS) ×3 IMPLANT
CUFF TOURN SGL QUICK 34 (TOURNIQUET CUFF) ×3
CUFF TRNQT CYL 34X4X40X1 (TOURNIQUET CUFF) ×1 IMPLANT
DECANTER SPIKE VIAL GLASS SM (MISCELLANEOUS) ×6 IMPLANT
DERMABOND ADVANCED (GAUZE/BANDAGES/DRESSINGS) ×4
DERMABOND ADVANCED .7 DNX12 (GAUZE/BANDAGES/DRESSINGS) ×2 IMPLANT
DRAPE SHEET LG 3/4 BI-LAMINATE (DRAPES) ×6 IMPLANT
DRAPE U-SHAPE 47X51 STRL (DRAPES) ×3 IMPLANT
DRSG AQUACEL AG ADV 3.5X10 (GAUZE/BANDAGES/DRESSINGS) ×3 IMPLANT
DRSG TEGADERM 4X4.75 (GAUZE/BANDAGES/DRESSINGS) IMPLANT
ELECT BLADE TIP CTD 4 INCH (ELECTRODE) ×3 IMPLANT
ELECT REM PT RETURN 15FT ADLT (MISCELLANEOUS) ×3 IMPLANT
EVACUATOR 1/8 PVC DRAIN (DRAIN) IMPLANT
GAUZE SPONGE 4X4 12PLY STRL (GAUZE/BANDAGES/DRESSINGS) ×3 IMPLANT
GLOVE BIO SURGEON STRL SZ8.5 (GLOVE) ×8 IMPLANT
GLOVE BIOGEL M 7.0 STRL (GLOVE) ×2 IMPLANT
GLOVE BIOGEL PI IND STRL 7.5 (GLOVE) IMPLANT
GLOVE BIOGEL PI IND STRL 8.5 (GLOVE) ×1 IMPLANT
GLOVE BIOGEL PI INDICATOR 7.5 (GLOVE) ×2
GLOVE BIOGEL PI INDICATOR 8.5 (GLOVE) ×2
GOWN SPEC L3 XXLG W/TWL (GOWN DISPOSABLE) ×3 IMPLANT
HANDPIECE INTERPULSE COAX TIP (DISPOSABLE) ×3
HOOD PEEL AWAY FLYTE STAYCOOL (MISCELLANEOUS) ×8 IMPLANT
MARKER SKIN DUAL TIP RULER LAB (MISCELLANEOUS) ×3 IMPLANT
NDL SPNL 18GX3.5 QUINCKE PK (NEEDLE) ×1 IMPLANT
NEEDLE SPNL 18GX3.5 QUINCKE PK (NEEDLE) ×3 IMPLANT
NS IRRIG 1000ML POUR BTL (IV SOLUTION) ×3 IMPLANT
PACK TOTAL KNEE CUSTOM (KITS) ×3 IMPLANT
PADDING CAST COTTON 6X4 STRL (CAST SUPPLIES) ×3 IMPLANT
POSITIONER SURGICAL ARM (MISCELLANEOUS) ×3 IMPLANT
SAW OSC TIP CART 19.5X105X1.3 (SAW) ×3 IMPLANT
SEALER BIPOLAR AQUA 6.0 (INSTRUMENTS) ×3 IMPLANT
SET HNDPC FAN SPRY TIP SCT (DISPOSABLE) ×1 IMPLANT
SET PAD KNEE POSITIONER (MISCELLANEOUS) ×3 IMPLANT
SPONGE DRAIN TRACH 4X4 STRL 2S (GAUZE/BANDAGES/DRESSINGS) IMPLANT
SPONGE LAP 18X18 X RAY DECT (DISPOSABLE) IMPLANT
SUCTION FRAZIER HANDLE 12FR (TUBING)
SUCTION TUBE FRAZIER 12FR DISP (TUBING) ×1 IMPLANT
SUT MNCRL AB 3-0 PS2 18 (SUTURE) ×3 IMPLANT
SUT MON AB 2-0 CT1 36 (SUTURE) ×6 IMPLANT
SUT STRATAFIX PDO 1 14 VIOLET (SUTURE) ×3
SUT STRATFX PDO 1 14 VIOLET (SUTURE) ×1
SUT VIC AB 1 CT1 36 (SUTURE) ×9 IMPLANT
SUT VIC AB 2-0 CT1 27 (SUTURE) ×3
SUT VIC AB 2-0 CT1 TAPERPNT 27 (SUTURE) ×1 IMPLANT
SUTURE STRATFX PDO 1 14 VIOLET (SUTURE) ×1 IMPLANT
SYR 50ML LL SCALE MARK (SYRINGE) ×3 IMPLANT
TOWER CARTRIDGE SMART MIX (DISPOSABLE) IMPLANT
TRAY FOLEY W/METER SILVER 16FR (SET/KITS/TRAYS/PACK) ×2 IMPLANT
WATER STERILE IRR 1000ML POUR (IV SOLUTION) ×6 IMPLANT
WRAP KNEE MAXI GEL POST OP (GAUZE/BANDAGES/DRESSINGS) ×3 IMPLANT
YANKAUER SUCT BULB TIP 10FT TU (MISCELLANEOUS) ×3 IMPLANT

## 2017-04-26 NOTE — Interval H&P Note (Signed)
History and Physical Interval Note:  04/26/2017 7:31 AM  Bradley [name]  has presented today for surgery, with the diagnosis of Degenerative joint disease right knee  The various methods of treatment have been discussed with the patient and family. After consideration of risks, benefits and other options for treatment, the patient has consented to  Procedure(s) with comments: RIGHT TOTAL KNEE ARTHROPLASTY WITH COMPUTER NAVIGATION (Right) - Needs RNFA as a surgical intervention .  The patient's history has been reviewed, patient examined, no change in status, stable for surgery.  I have reviewed the patient's chart and labs.  Questions were answered to the patient's satisfaction.     Carmichael Burdette [name]

## 2017-04-26 NOTE — Transfer of Care (Signed)
Immediate Anesthesia Transfer of Care Note  Patient: Bradley [name]  Procedure(s) Performed: RIGHT TOTAL KNEE ARTHROPLASTY WITH COMPUTER NAVIGATION (Right Knee)  Patient Location: PACU  Anesthesia Type:Spinal  Level of Consciousness: awake, alert  and oriented  Airway & Oxygen Therapy: Patient Spontanous Breathing and Patient connected to face mask oxygen  Post-op Assessment: Report given to RN  Post vital signs: Reviewed and stable  Last Vitals:  Vitals:   04/26/17 0551  BP: 133/90  Pulse: 74  Resp: 16  Temp: 36.8 C  SpO2: 98%    Last Pain:  Vitals:   04/26/17 0633  TempSrc:   PainSc: 6       Patients Stated Pain Goal: 4 (10/17/86 7195)  Complications: No apparent anesthesia complications

## 2017-04-26 NOTE — Anesthesia Postprocedure Evaluation (Signed)
Anesthesia Post Note  Patient: Bradley [name]  Procedure(s) Performed: RIGHT TOTAL KNEE ARTHROPLASTY WITH COMPUTER NAVIGATION (Right Knee)     Patient location during evaluation: PACU Anesthesia Type: Spinal Level of consciousness: oriented and awake and alert Pain management: pain level controlled Vital Signs Assessment: post-procedure vital signs reviewed and stable Respiratory status: spontaneous breathing, respiratory function stable and patient connected to nasal cannula oxygen Cardiovascular status: blood pressure returned to baseline and stable Postop Assessment: no headache, no backache and no apparent nausea or vomiting Anesthetic complications: no    Last Vitals:  Vitals:   04/26/17 1148 04/26/17 1208  BP: 106/74 97/75  Pulse: 64 63  Resp: 12 12  Temp: 36.5 C 36.4 C  SpO2: 100% 100%    Last Pain:  Vitals:   04/26/17 1148  TempSrc:   PainSc: 0-No pain                 Araminta Zorn S

## 2017-04-26 NOTE — Evaluation (Signed)
Physical Therapy Evaluation Patient Details Name: Bradley [name] MRN: [id number] DOB: December 12, 1970 Today's Date: 04/26/2017   History of Present Illness  Pt is a 47 year old male s/p R TKA with PMH of back surgery and R rotator cuff tear (reports need for TSA)  Clinical Impression  Pt is s/p TKA resulting in the deficits listed below (see PT Problem List).  Pt will benefit from skilled PT to increase their independence and safety with mobility to allow discharge to the venue listed below.  Pt ambulated short distance POD #0 and plans to d/c home with family assist.  Pt is motivated to return to independence quickly as he typically watches his grandchildren everyday.     Follow Up Recommendations Outpatient PT    Equipment Recommendations  None recommended by PT    Recommendations for Other Services       Precautions / Restrictions Precautions Precautions: Knee Restrictions Other Position/Activity Restrictions: WBAT      Mobility  Bed Mobility Overal bed mobility: Needs Assistance Bed Mobility: Supine to Sit     Supine to sit: Min assist     General bed mobility comments: assist for R LE  Transfers Overall transfer level: Needs assistance Equipment used: Rolling walker (2 wheeled) Transfers: Sit to/from Stand Sit to Stand: Min guard         General transfer comment: verbal cues for UE and LE positioning  Ambulation/Gait Ambulation/Gait assistance: Min guard Ambulation Distance (Feet): 60 Feet Assistive device: Rolling walker (2 wheeled) Gait Pattern/deviations: Step-through pattern;Decreased stride length;Decreased step length - left;Antalgic     General Gait Details: verbal cues for sequence, RW positioning, posture  Stairs            Wheelchair Mobility    Modified Rankin (Stroke Patients Only)       Balance                                             Pertinent Vitals/Pain Pain Assessment: 0-10 Pain Score: 7  Pain  Location: R knee Pain Descriptors / Indicators: Aching;Sore Pain Intervention(s): Limited activity within patient's tolerance;Monitored during session;Repositioned;Premedicated before session    [place] expects to be discharged to:: Private residence Living Arrangements: Spouse/significant other Available Help at Discharge: Family;Available 24 hours/day Type of Home: House Home Access: Ramped entrance     Home Layout: One level Home Equipment: Walker - 2 wheels      Prior Function Level of Independence: Independent         Comments: usually watches his grandchildren     Hand Dominance        Extremity/Trunk Assessment        Lower Extremity Assessment Lower Extremity Assessment: RLE deficits/detail RLE Deficits / Details: unable to perform SLR, ROM TBA       Communication   Communication: No difficulties  Cognition Arousal/Alertness: Awake/alert Behavior During Therapy: WFL for tasks assessed/performed Overall Cognitive Status: Within Functional Limits for tasks assessed                                        General Comments      Exercises     Assessment/Plan    PT Assessment Patient needs continued PT services  PT Problem List Decreased mobility;Decreased range of motion;Decreased  strength;Decreased knowledge of use of DME;Pain;Decreased knowledge of precautions       PT Treatment Interventions Stair training;Gait training;Therapeutic exercise;Patient/family education;Functional mobility training;DME instruction;Therapeutic activities    PT Goals (Current goals can be found in the Care Plan section)  Acute Rehab PT Goals PT Goal Formulation: With patient Time For Goal Achievement: 05/03/17 Potential to Achieve Goals: Good    Frequency 7X/week   Barriers to discharge        Co-evaluation               AM-PAC PT "6 Clicks" Daily Activity  Outcome Measure Difficulty turning over in bed (including  adjusting bedclothes, sheets and blankets)?: A Little Difficulty moving from lying on back to sitting on the side of the bed? : Unable Difficulty sitting down on and standing up from a chair with arms (e.g., wheelchair, bedside commode, etc,.)?: Unable Help needed moving to and from a bed to chair (including a wheelchair)?: A Little Help needed walking in hospital room?: A Little Help needed climbing 3-5 steps with a railing? : A Lot 6 Click Score: 13    End of Session Equipment Utilized During Treatment: Gait belt Activity Tolerance: Patient tolerated treatment well Patient left: in chair;with call bell/phone within reach;with family/visitor present   PT Visit Diagnosis: Other abnormalities of gait and mobility (R26.89)    Time: 4332-9518 PT Time Calculation (min) (ACUTE ONLY): 19 min   Charges:   PT Evaluation $PT Eval Low Complexity: 1 Low     PT G CodesCarmelia Bake, PT, DPT 04/26/2017 Pager: [phone]  York Ram E 04/26/2017, 4:49 PM

## 2017-04-26 NOTE — Discharge Instructions (Signed)
Dr. Rod Can Total Joint Specialist [facility] [address], [place], [place] [address] [phone]  TOTAL KNEE REPLACEMENT POSTOPERATIVE DIRECTIONS    Knee Rehabilitation, Guidelines Following Surgery  Results after knee surgery are often greatly improved when you follow the exercise, range of motion and muscle strengthening exercises prescribed by your doctor. Safety measures are also important to protect the knee from further injury. Any time any of these exercises cause you to have increased pain or swelling in your knee joint, decrease the amount until you are comfortable again and slowly increase them. If you have problems or questions, call your caregiver or physical therapist for advice.   WEIGHT BEARING Weight bearing as tolerated with assist device (walker, cane, etc) as directed, use it as long as suggested by your surgeon or therapist, typically at least 4-6 weeks.  HOME CARE INSTRUCTIONS  Remove items at home which could result in a fall. This includes throw rugs or furniture in walking pathways.  Continue medications as instructed at time of discharge. You may have some home medications which will be placed on hold until you complete the course of blood thinner medication.  You may start showering once you are discharged home but do not submerge the incision under water. Just pat the incision dry and apply a dry gauze dressing on daily. Walk with walker as instructed.  You may resume a sexual relationship in one month or when given the OK by your doctor.   Use walker as long as suggested by your caregivers.  Avoid periods of inactivity such as sitting longer than an hour when not asleep. This helps prevent blood clots.  You may put full weight on your legs and walk as much as is comfortable.  You may return to work once you are cleared by your doctor.  Do not drive a car for 6 weeks or until released by you surgeon.   Do not drive  while taking narcotics.  Wear the elastic stockings for three weeks following surgery during the day but you may remove then at night. Make sure you keep all of your appointments after your operation with all of your doctors and caregivers. You should call the office at the above phone number and make an appointment for approximately two weeks after the date of your surgery. Do not remove your surgical dressing. The dressing is waterproof; you may take showers in 3 days, but do not take tub baths or submerge the dressing. Please pick up a stool softener and laxative for home use as long as you are requiring pain medications.  ICE to the affected knee every three hours for 30 minutes at a time and then as needed for pain and swelling.  Continue to use ice on the knee for pain and swelling from surgery. You may notice swelling that will progress down to the foot and ankle.  This is normal after surgery.  Elevate the leg when you are not up walking on it.   It is important for you to complete the blood thinner medication as prescribed by your doctor.  Continue to use the breathing machine which will help keep your temperature down.  It is common for your temperature to cycle up and down following surgery, especially at night when you are not up moving around and exerting yourself.  The breathing machine keeps your lungs expanded and your temperature down.  RANGE OF MOTION AND STRENGTHENING EXERCISES  Rehabilitation of the knee is important following  a knee injury or an operation. After just a few days of immobilization, the muscles of the thigh which control the knee become weakened and shrink (atrophy). Knee exercises are designed to build up the tone and strength of the thigh muscles and to improve knee motion. Often times heat used for twenty to thirty minutes before working out will loosen up your tissues and help with improving the range of motion but do not use heat for the first two weeks following  surgery. These exercises can be done on a training (exercise) mat, on the floor, on a table or on a bed. Use what ever works the best and is most comfortable for you Knee exercises include:  Leg Lifts - While your knee is still immobilized in a splint or cast, you can do straight leg raises. Lift the leg to 60 degrees, hold for 3 sec, and slowly lower the leg. Repeat 10-20 times 2-3 times daily. Perform this exercise against resistance later as your knee gets better.  Quad and Hamstring Sets - Tighten up the muscle on the front of the thigh (Quad) and hold for 5-10 sec. Repeat this 10-20 times hourly. Hamstring sets are done by pushing the foot backward against an object and holding for 5-10 sec. Repeat as with quad sets.  A rehabilitation program following serious knee injuries can speed recovery and prevent re-injury in the future due to weakened muscles. Contact your doctor or a physical therapist for more information on knee rehabilitation.   SKILLED REHAB INSTRUCTIONS: If the patient is transferred to a skilled rehab facility following release from the hospital, a list of the current medications will be sent to the facility for the patient to continue.  When discharged from the skilled rehab facility, please have the facility set up the patient's [place] prior to being released. Also, the skilled facility will be responsible for providing the patient with their medications at time of release from the facility to include their pain medication, the muscle relaxants, and their blood thinner medication. If the patient is still at the rehab facility at time of the two week follow up appointment, the skilled rehab facility will also need to assist the patient in arranging follow up appointment in our office and any transportation needs.  MAKE SURE YOU:  Understand these instructions.  Will watch your condition.  Will get help right away if you are not doing well or get worse.     Pick up stool softner and laxative for home use following surgery while on pain medications. Do NOT remove your dressing. You may shower.  Do not take tub baths or submerge incision under water. May shower starting three days after surgery. Please use a clean towel to pat the incision dry following showers. Continue to use ice for pain and swelling after surgery. Do not use any lotions or creams on the incision until instructed by your surgeon.

## 2017-04-26 NOTE — Anesthesia Procedure Notes (Signed)
Spinal  Patient location during procedure: OR Start time: 04/26/2017 7:35 AM End time: 04/26/2017 7:45 AM Staffing Anesthesiologist: Myrtie Soman, MD Performed: anesthesiologist  Preanesthetic Checklist Completed: patient identified, site marked, surgical consent, pre-op evaluation, timeout performed, IV checked, risks and benefits discussed and monitors and equipment checked Spinal Block Patient position: sitting Prep: Betadine Patient monitoring: heart rate, continuous pulse ox and blood pressure Injection technique: single-shot Needle Needle type: Spinocan  Needle gauge: 22 G Needle length: 9 cm Additional Notes Expiration date of kit checked and confirmed. Patient tolerated procedure well, without complications.

## 2017-04-26 NOTE — Anesthesia Procedure Notes (Signed)
Anesthesia Regional Block: Adductor canal block   Pre-Anesthetic Checklist: ,, timeout performed, Correct Patient, Correct Site, Correct Laterality, Correct Procedure, Correct Position, site marked, Risks and benefits discussed,  Surgical consent,  Pre-op evaluation,  At surgeon's request and post-op pain management  Laterality: Right  Prep: chloraprep       Needles:  Injection technique: Single-shot  Needle Type: Echogenic Needle     Needle Length: 9cm      Additional Needles:   Procedures:,,,, ultrasound used (permanent image in chart),,,,  Narrative:  Start time: 04/26/2017 7:00 AM End time: 04/26/2017 7:09 AM Injection made incrementally with aspirations every 5 mL.  Performed by: Personally  Anesthesiologist: Myrtie Soman, MD  Additional Notes: Patient tolerated the procedure well without complications

## 2017-04-26 NOTE — Anesthesia Procedure Notes (Signed)
Anesthesia Procedure Image    

## 2017-04-26 NOTE — Op Note (Signed)
OPERATIVE REPORT  SURGEON: Rod Can, MD   ASSISTANT: Nehemiah Massed, PA-C.  PREOPERATIVE DIAGNOSIS: Right knee arthritis.   POSTOPERATIVE DIAGNOSIS: Right knee arthritis.   PROCEDURE: Right total knee arthroplasty.   IMPLANTS: Stryker Triathlon CR femur, size 6. Stryker Tritanium tibia, size 6. X3 polyethelyene insert, size 11 mm, CS. 3 button asymmetric patella, size 35 mm.  ANESTHESIA:  Regional and Spinal  TOURNIQUET TIME: Not utilized.   ESTIMATED BLOOD LOSS:-300 mL    ANTIBIOTICS: 2 g Ancef.  DRAINS: None.  COMPLICATIONS: None   CONDITION: PACU - hemodynamically stable.   BRIEF CLINICAL NOTE: Bradley [name] is a 47 y.o. male with a long-standing history of Right knee arthritis. After failing conservative management, the patient was indicated for total knee arthroplasty. The risks, benefits, and alternatives to the procedure were explained, and the patient elected to proceed.  PROCEDURE IN DETAIL: Adductor canal block was obtained in the pre-op holding area. Once inside the operative room, spinal anesthesia was obtained, and a foley catheter was inserted. The patient was then positioned, a nonsterile tourniquet was placed, and the lower extremity was prepped and draped in the normal sterile surgical fashion. A time-out was called verifying side and site of surgery. The patient received IV antibiotics within 60 minutes of beginning the procedure. The tourniquet was not utilized.  An anterior approach to the knee was performed utilizing a midvastus arthrotomy. A medial release was performed and the patellar fat pad was excised. Stryker navigation was used to cut the distal femur perpendicular to the mechanical axis. A freehand patellar resection was performed, and the patella was sized an prepared with 3 lug holes.  Nagivation was used to make a neutral proximal tibia  resection, taking 9 mm of bone from the less affected lateral side with 3 degrees of slope. The menisci were excised. A spacer block was placed, and the alignment and balance in extension were confirmed.   The distal femur was sized using the 3-degree external rotation guide referencing the posterior femoral cortex. The appropriate 4-in-1 cutting block was pinned into place. Rotation was checked using Whiteside's line, the epicondylar axis, and then confirmed with a spacer block in flexion. The remaining femoral cuts were performed, taking care to protect the MCL.  The tibia was sized and the trial tray was pinned into place. The remaining trail components were inserted. The knee was stable to varus and valgus stress through a full range of motion. The patella tracked centrally, and the PCL was well balanced. The trial components were removed, and the proximal tibial surface was prepared. Final components were impacted into place. The knee was tested for a final time and found to be well balanced.  The wound was copiously irrigated with normal saline using pulse lavage. Marcaine solution was injected into the periarticular soft tissue. The wound was closed in layers using #1 Vicryl and Stratafix for the fascia, 2-0 Vicryl for the subcutaneous fat, 2-0 Monocryl for the deep dermal layer, 3-0 running Monocryl subcuticular Stitch, and Dermabond for the skin. Once the glue was fully dried, an Aquacell Ag and compressive dressing were applied. Tthe patient was transported to the recovery room in stable condition. Sponge, needle, and instrument counts were correct at the end of the case x2. The patient tolerated the procedure well and there were no known complications.  Please note that a surgical assistant was a medical necessity for this procedure in order to perform it in a safe and expeditious manner. Surgical assistant was necessary  to retract the ligaments and vital neurovascular structures to prevent  injury to them and also necessary for proper positioning of the limb to allow for anatomic placement of the prosthesis.

## 2017-04-26 NOTE — Anesthesia Preprocedure Evaluation (Addendum)
Anesthesia Evaluation  Patient identified by MRN, date of birth, ID band Patient awake    Reviewed: Allergy & Precautions, NPO status , Patient's Chart, lab work & pertinent test results  Airway Mallampati: II  TM Distance: >3 FB Neck ROM: Full    Dental no notable dental hx.    Pulmonary asthma ,    Pulmonary exam normal breath sounds clear to auscultation       Cardiovascular hypertension, Normal cardiovascular exam+ Valvular Problems/Murmurs  Rhythm:Regular Rate:Normal     Neuro/Psych negative neurological ROS  negative psych ROS   GI/Hepatic negative GI ROS, Neg liver ROS,   Endo/Other  negative endocrine ROS  Renal/GU negative Renal ROS  negative genitourinary   Musculoskeletal negative musculoskeletal ROS (+)   Abdominal   Peds negative pediatric ROS (+)  Hematology  (+) anemia ,   Anesthesia Other Findings   Reproductive/Obstetrics negative OB ROS                            Anesthesia Physical Anesthesia Plan  ASA: III  Anesthesia Plan: Spinal   Post-op Pain Management:    Induction: Intravenous  PONV Risk Score and Plan: 1 and Ondansetron  Airway Management Planned: Simple Face Mask  Additional Equipment:   Intra-op Plan:   Post-operative Plan: Extubation in OR  Informed Consent: I have reviewed the patients History and Physical, chart, labs and discussed the procedure including the risks, benefits and alternatives for the proposed anesthesia with the patient or authorized representative who has indicated his/her understanding and acceptance.   Dental advisory given  Plan Discussed with: CRNA and Surgeon  Anesthesia Plan Comments: (Significant back surgery, switch to GA if SAB not applicable)        Anesthesia Quick Evaluation

## 2017-04-27 LAB — BASIC METABOLIC PANEL
Anion gap: 7 (ref 5–15)
BUN: 10 mg/dL (ref 6–20)
CO2: 22 mmol/L (ref 22–32)
Calcium: 8.2 mg/dL — ABNORMAL LOW (ref 8.9–10.3)
Chloride: 105 mmol/L (ref 101–111)
Creatinine, Ser: 0.81 mg/dL (ref 0.61–1.24)
GFR calc Af Amer: >60 mL/min (ref >60)
GFR calc non Af Amer: >60 mL/min (ref >60)
Glucose, Bld: 119 mg/dL — ABNORMAL HIGH (ref 65–99)
Potassium: 4.5 mmol/L (ref 3.5–5.1)
Sodium: 134 mmol/L — ABNORMAL LOW (ref 135–145)

## 2017-04-27 LAB — CBC
HCT: 31.4 % — ABNORMAL LOW (ref 39.0–52.0)
Hemoglobin: 10.3 g/dL — ABNORMAL LOW (ref 13.0–17.0)
MCH: 26.1 pg (ref 26.0–34.0)
MCHC: 32.8 g/dL (ref 30.0–36.0)
MCV: 79.7 fL (ref 78.0–100.0)
Platelets: 242 10*3/uL (ref 150–400)
RBC: 3.94 MIL/uL — ABNORMAL LOW (ref 4.22–5.81)
RDW: 14.9 % (ref 11.5–15.5)
WBC: 12.3 10*3/uL — ABNORMAL HIGH (ref 4.0–10.5)

## 2017-04-27 MED ORDER — ONDANSETRON HCL 4 MG PO TABS: 4.0000 mg | ORAL_TABLET | Freq: Four times a day (QID) | ORAL | 0 refills | Status: DC | PRN

## 2017-04-27 MED ORDER — DOCUSATE SODIUM 100 MG PO CAPS: 100.0000 mg | ORAL_CAPSULE | Freq: Two times a day (BID) | ORAL | 1 refills | Status: DC

## 2017-04-27 MED ORDER — SENNA 8.6 MG PO TABS: 2.0000 | ORAL_TABLET | Freq: Every day | ORAL | 0 refills | Status: DC

## 2017-04-27 MED ORDER — ASPIRIN 81 MG PO CHEW: 81.0000 mg | CHEWABLE_TABLET | Freq: Two times a day (BID) | ORAL | 1 refills | Status: DC

## 2017-04-27 MED ORDER — OXYCODONE HCL 5 MG PO TABS: 10.0000 mg | ORAL_TABLET | ORAL | 0 refills | Status: DC | PRN

## 2017-04-27 NOTE — Progress Notes (Signed)
    Subjective:  Patient reports pain as mild to moderate.  Denies N/V/CP/SOB.   Objective:   VITALS:   Vitals:   04/26/17 1800 04/26/17 2036 04/27/17 0049 04/27/17 0540  BP: 104/70 (!) 148/94 128/82 136/82  Pulse: 84 77 86 86  Resp: 15 17 15 16   Temp: 98 F (36.7 C) 98.2 F (36.8 C) 98 F (36.7 C) 98 F (36.7 C)  TempSrc: Oral Oral Oral Oral  SpO2: 96% 96% 98% 99%  Weight:      Height:        NAD ABD soft Sensation intact distally Intact pulses distally Dorsiflexion/Plantar flexion intact Incision: dressing C/D/I Compartment soft   Lab Results  Component Value Date   WBC 12.3 (H) 04/27/2017   HGB 10.3 (L) 04/27/2017   HCT 31.4 (L) 04/27/2017   MCV 79.7 04/27/2017   PLT 242 04/27/2017   BMET    Component Value Date/Time   NA 134 (L) 04/27/2017 0524   K 4.5 04/27/2017 0524   CL 105 04/27/2017 0524   CO2 22 04/27/2017 0524   GLUCOSE 119 (H) 04/27/2017 0524   BUN 10 04/27/2017 0524   CREATININE 0.81 04/27/2017 0524   CREATININE 0.69 09/29/2014 1436   CALCIUM 8.2 (L) 04/27/2017 0524   CALCIUM 8.7 10/05/2009 2249   GFRNONAA >60 04/27/2017 0524   GFRNONAA >89 09/29/2014 1436   GFRAA >60 04/27/2017 0524   GFRAA >89 09/29/2014 1436     Assessment/Plan: 1 Day Post-Op   Principal Problem:   Osteoarthritis of right knee Active Problems:   Osteoarthritis of left knee   WBAT with walker DVT ppx: ASA, SCDs, TEDS PO pain control PT/OT Dispo: D/C home today, already set up for outpatient PT   Bradley [name] Bradley [name] 04/27/2017, 9:05 AM   Rod Can, MD Cell [phone]

## 2017-04-27 NOTE — Progress Notes (Signed)
Physical Therapy Treatment Patient Details Name: Bradley [name] MRN: [id number] DOB: [birth year] Today's Date: 04/27/2017    History of Present Illness Pt is a 47 year old male s/p R TKA with PMH of back surgery and R rotator cuff tear (reports need for TSA)    PT Comments    Pt ambulated in hallway and performed LE exercises.  Pt reports pain better today, and he feels ready for d/c home.  Pt provided with HEP and had no further questions.  Follow Up Recommendations  Outpatient PT     Equipment Recommendations  None recommended by PT    Recommendations for Other Services       Precautions / Restrictions Precautions Precautions: Knee Restrictions Other Position/Activity Restrictions: WBAT    Mobility  Bed Mobility Overal bed mobility: Needs Assistance Bed Mobility: Supine to Sit     Supine to sit: Supervision        Transfers Overall transfer level: Needs assistance Equipment used: Rolling walker (2 wheeled) Transfers: Sit to/from Stand Sit to Stand: Min guard;Supervision         General transfer comment: verbal cues for UE and LE positioning  Ambulation/Gait Ambulation/Gait assistance: Min guard;Supervision Ambulation Distance (Feet): 100 Feet Assistive device: Rolling walker (2 wheeled) Gait Pattern/deviations: Step-through pattern;Decreased stride length;Decreased step length - left;Antalgic     General Gait Details: verbal cues for RW positioning, posture   Stairs            Wheelchair Mobility    Modified Rankin (Stroke Patients Only)       Balance                                            Cognition Arousal/Alertness: Awake/alert Behavior During Therapy: WFL for tasks assessed/performed Overall Cognitive Status: Within Functional Limits for tasks assessed                                        Exercises Total Joint Exercises Ankle Circles/Pumps: AROM;Both;10 reps Quad Sets: AROM;Both;10  reps Short Arc Quad: Right;AROM;10 reps Heel Slides: AROM;10 reps;Seated;Right Hip ABduction/ADduction: AROM;10 reps;Right Straight Leg Raises: AROM;10 reps;Right Goniometric ROM: approximately -3-75* AAROM R knee     General Comments        Pertinent Vitals/Pain Pain Assessment: 0-10 Pain Score: 5  Pain Location: R knee Pain Descriptors / Indicators: Aching;Sore Pain Intervention(s): Limited activity within patient's tolerance;Repositioned;Monitored during session;Ice applied    Home Living                      Prior Function            PT Goals (current goals can now be found in the care plan section) Progress towards PT goals: Progressing toward goals    Frequency    7X/week      PT Plan Current plan remains appropriate    Co-evaluation              AM-PAC PT "6 Clicks" Daily Activity  Outcome Measure  Difficulty turning over in bed (including adjusting bedclothes, sheets and blankets)?: None Difficulty moving from lying on back to sitting on the side of the bed? : None Difficulty sitting down on and standing up from a chair with arms (e.g., wheelchair, bedside commode, etc,.)?:  A Little Help needed moving to and from a bed to chair (including a wheelchair)?: A Little Help needed walking in hospital room?: A Little Help needed climbing 3-5 steps with a railing? : A Little 6 Click Score: 20    End of Session   Activity Tolerance: Patient tolerated treatment well Patient left: in chair;with call bell/phone within reach   PT Visit Diagnosis: Other abnormalities of gait and mobility (R26.89)     Time: 8412-8208 PT Time Calculation (min) (ACUTE ONLY): 24 min  Charges:  $Gait Training: 8-22 mins $Therapeutic Exercise: 8-22 mins                    G Codes:       Carmelia Bake, PT, DPT 04/27/2017 Pager: [phone]  York Ram E 04/27/2017, 2:27 PM

## 2017-04-27 NOTE — Progress Notes (Signed)
Spoke with patient and spouse at bedside. Confirmed plan for OP PT, already arranged. Has RW and 3n1. [phone]

## 2017-04-27 NOTE — Discharge Summary (Signed)
Physician Discharge Summary  Patient ID: Bradley [name] MRN: [id number] DOB/AGE: February 11, 1971 47 y.o.  Admit date: 04/26/2017 Discharge date: 04/27/2017  Admission Diagnoses:  Osteoarthritis of right knee  Discharge Diagnoses:  Principal Problem:   Osteoarthritis of right knee Active Problems:   Osteoarthritis of left knee   Past Medical History:  Diagnosis Date  . Anemia    since gastric bypass  . Anxiety   . Aortic valve sclerosis   . Arthritis   . Arthropathy, unspecified, other specified sites   . CAD (coronary artery disease)   . Contact with or exposure to venereal diseases   . Depression   . Displacement of intervertebral disc, site unspecified, without myelopathy   . GERD (gastroesophageal reflux disease)   . Heart murmur    " slight, [place] said nothing to worry about."  . History of kidney stones   . Hypertension     " no longer have hypertension since gastric bypass "  . Lumbago   . Obesity, unspecified   . Other, mixed, or unspecified nondependent drug abuse, unspecified   . Personal history of urinary calculi   . Pneumonia   . Priapism   . Stenosis, spinal, lumbar     Surgeries: Procedure(s): RIGHT TOTAL KNEE ARTHROPLASTY WITH COMPUTER NAVIGATION on 04/26/2017   Consultants (if any):   Discharged Condition: Improved  Hospital Course: Bradley [name] is an 47 y.o. male who was admitted 04/26/2017 with a diagnosis of Osteoarthritis of right knee and went to the operating room on 04/26/2017 and underwent the above named procedures.    He was given perioperative antibiotics:  Anti-infectives (From admission, onward)   Start     Dose/Rate Route Frequency Ordered Stop   04/26/17 1500  vancomycin (VANCOCIN) IVPB 1000 mg/200 mL premix     1,000 mg 200 mL/hr over 60 Minutes Intravenous Every 12 hours 04/26/17 1216 04/26/17 1810   04/26/17 0621  ceFAZolin (ANCEF) IVPB 2g/100 mL premix     2 g 200 mL/hr over 30 Minutes Intravenous On call to O.R. 04/26/17  1025 04/26/17 0745    .  He was given sequential compression devices, early ambulation, and ASA for DVT prophylaxis.  He benefited maximally from the hospital stay and there were no complications.    Recent vital signs:  Vitals:   04/27/17 0540 04/27/17 0914  BP: 136/82 108/81  Pulse: 86 81  Resp: 16 16  Temp: 98 F (36.7 C) 97.9 F (36.6 C)  SpO2: 99% 100%    Recent laboratory studies:  Lab Results  Component Value Date   HGB 10.3 (L) 04/27/2017   HGB 12.7 (L) 04/20/2017   HGB 13.3 09/05/2016   Lab Results  Component Value Date   WBC 12.3 (H) 04/27/2017   PLT 242 04/27/2017   No results found for: INR Lab Results  Component Value Date   NA 134 (L) 04/27/2017   K 4.5 04/27/2017   CL 105 04/27/2017   CO2 22 04/27/2017   BUN 10 04/27/2017   CREATININE 0.81 04/27/2017   GLUCOSE 119 (H) 04/27/2017    Discharge Medications:   Allergies as of 04/27/2017      Reactions   Codeine Hives   Hives and itching      Medication List    STOP taking these medications   oxycodone 5 MG capsule Commonly known as:  OXY-IR Replaced by:  oxyCODONE 5 MG immediate release tablet     TAKE these medications   aspirin 81 MG chewable  tablet Chew 1 tablet (81 mg total) by mouth 2 (two) times daily.   Azilsartan-Chlorthalidone 40-25 MG Tabs Commonly known as:  EDARBYCLOR Take 1 tablet by mouth daily.   Cholecalciferol 2000 units Tabs Take 1 tablet (2,000 Units total) by mouth daily.   docusate sodium 100 MG capsule Commonly known as:  COLACE Take 1 capsule (100 mg total) by mouth 2 (two) times daily.   multivitamin tablet Take 1 tablet by mouth daily.   ondansetron 4 MG tablet Commonly known as:  ZOFRAN Take 1 tablet (4 mg total) by mouth every 6 (six) hours as needed for nausea.   oxyCODONE 5 MG immediate release tablet Commonly known as:  Oxy IR/ROXICODONE Take 2-3 tablets (10-15 mg total) by mouth every 3 (three) hours as needed. Replaces:  oxycodone 5 MG  capsule   senna 8.6 MG Tabs tablet Commonly known as:  SENOKOT Take 2 tablets (17.2 mg total) by mouth at bedtime.   zinc gluconate 50 MG tablet Take 1 tablet (50 mg total) by mouth daily.            Durable Medical Equipment  (From admission, onward)        Start     Ordered   04/26/17 1217  DME Walker rolling  Once    Question:  Patient needs a walker to treat with the following condition  Answer:  S/P total knee arthroplasty, right   04/26/17 1216   04/26/17 1217  DME 3 n 1  Once     04/26/17 1216      Diagnostic Studies: Dg Knee Right Port  Result Date: 04/26/2017 CLINICAL DATA:  Post right knee replacement EXAM: PORTABLE RIGHT KNEE - 1-2 VIEW COMPARISON:  None. FINDINGS: The femoral and tibial components of the right total knee replacement are in good position and alignment. No complicating features are seen. Air is noted in the soft tissues and joint space postoperatively. IMPRESSION: Right total knee replacement components in good position and alignment. No complicating features. Electronically Signed   By: Ivar Drape M.D.   On: 04/26/2017 11:34    Disposition: 01-Home or Self Care  Discharge Instructions    Call MD / Call 911   Complete by:  As directed    If you experience chest pain or shortness of breath, CALL 911 and be transported to the hospital emergency room.  If you develope a fever above 101 F, pus (white drainage) or increased drainage or redness at the wound, or calf pain, call your surgeon's office.   Constipation Prevention   Complete by:  As directed    Drink plenty of fluids.  Prune juice may be helpful.  You may use a stool softener, such as Colace (over the counter) 100 mg twice a day.  Use MiraLax (over the counter) for constipation as needed.   Diet - low sodium heart healthy   Complete by:  As directed    Driving restrictions   Complete by:  As directed    No driving for 6 weeks   Increase activity slowly as tolerated   Complete by:  As  directed    Lifting restrictions   Complete by:  As directed    No lifting for 6 weeks   TED hose   Complete by:  As directed    Use stockings (TED hose) for 2 weeks on both leg(s).  You may remove them at night for sleeping.      Follow-up Information    Maneh Sieben, Gohan,  MD. Schedule an appointment as soon as possible for a visit in 2 weeks.   Specialty:  Orthopedic Surgery Why:  For wound re-check Contact information: [address] [place] [address] [phone]            Signed: Jacorie Ernsberger Avni Traore 04/27/2017, 12:58 PM

## 2017-04-30 DIAGNOSIS — M25561 Pain in right knee: Secondary | ICD-10-CM | POA: Diagnosis not present

## 2017-05-03 ENCOUNTER — Encounter: Payer: Self-pay | Admitting: Internal Medicine

## 2017-05-04 ENCOUNTER — Other Ambulatory Visit (HOSPITAL_COMMUNITY): Payer: Self-pay | Admitting: Orthopedic Surgery

## 2017-05-04 ENCOUNTER — Ambulatory Visit (HOSPITAL_COMMUNITY)
Admission: RE | Admit: 2017-05-04 | Discharge: 2017-05-04 | Disposition: A | Discharge status: Home / Self Care | Payer: BLUE CROSS/BLUE SHIELD | Source: Ambulatory Visit | Attending: Cardiology | Admitting: Cardiology

## 2017-05-04 DIAGNOSIS — R600 Localized edema: Secondary | ICD-10-CM | POA: Diagnosis not present

## 2017-05-07 DIAGNOSIS — M25561 Pain in right knee: Secondary | ICD-10-CM | POA: Diagnosis not present

## 2017-05-10 DIAGNOSIS — M25561 Pain in right knee: Secondary | ICD-10-CM | POA: Diagnosis not present

## 2017-05-11 DIAGNOSIS — Z471 Aftercare following joint replacement surgery: Secondary | ICD-10-CM | POA: Diagnosis not present

## 2017-05-11 DIAGNOSIS — Z96651 Presence of right artificial knee joint: Secondary | ICD-10-CM | POA: Diagnosis not present

## 2017-05-14 ENCOUNTER — Ambulatory Visit: Payer: BLUE CROSS/BLUE SHIELD | Admitting: Family Medicine

## 2017-05-14 VITALS — BP 160/110 | HR 79 | Wt 230.0 lb

## 2017-05-14 DIAGNOSIS — M25551 Pain in right hip: Secondary | ICD-10-CM | POA: Diagnosis not present

## 2017-05-14 MED ORDER — DICLOFENAC SODIUM 2 % TD SOLN: 1.0000 "application " | Freq: Two times a day (BID) | TRANSDERMAL | 3 refills | Status: DC

## 2017-05-14 MED ORDER — METHYLPREDNISOLONE ACETATE 40 MG/ML IJ SUSP
40.0000 mg | Freq: Once | INTRAMUSCULAR | Status: AC
  Administered 2017-05-14: 40 mg via INTRAMUSCULAR

## 2017-05-14 NOTE — Progress Notes (Signed)
Bradley [name] - 47 y.o. male MRN [id number]  Date of birth: 1970-10-30  SUBJECTIVE:  Including CC & ROS.  Chief Complaint  Patient presents with  . Hip Pain    Bradley [name] is a 47 y.o. male that is presenting with right lateral hip pain. The pain is severe in nature. He has pain with walking and lying on the right side. He had a total knee arthroplasty performed 2 weeks ago. He feels the pain is getting worse. The pain is sharp and stabbing in nature. He does feel it at the lateral aspect of his right hip also has some radiation distally to just above his knee. He has a history of multiple levels of back fusions. He has not had pain like this similar. He expresses pain initially coming out of his knee surgery. His pain is not controlled with the current pain medications that he is currently taking. He denies any injury or trauma. Has tried sitting in a hot bath which seems improved the pain..    Review of Systems  Constitutional: Negative for fever.  Eyes: Negative for visual disturbance.  Respiratory: Negative for cough.   Cardiovascular: Negative for chest pain.  Musculoskeletal: Positive for back pain.  Skin: Positive for wound.  Allergic/Immunologic: Negative for immunocompromised state.  Neurological: Negative for numbness.  Hematological: Negative for adenopathy.  Psychiatric/Behavioral: Negative for agitation.    HISTORY: Past Medical, Surgical, Social, and Family History Reviewed & Updated per EMR.   Pertinent Historical Findings include:  Past Medical History:  Diagnosis Date  . Anemia    since gastric bypass  . Anxiety   . Aortic valve sclerosis   . Arthritis   . Arthropathy, unspecified, other specified sites   . CAD (coronary artery disease)   . Contact with or exposure to venereal diseases   . Depression   . Displacement of intervertebral disc, site unspecified, without myelopathy   . GERD (gastroesophageal reflux disease)   . Heart murmur    " slight,  [place] said nothing to worry about."  . History of kidney stones   . Hypertension     " no longer have hypertension since gastric bypass "  . Lumbago   . Obesity, unspecified   . Other, mixed, or unspecified nondependent drug abuse, unspecified   . Personal history of urinary calculi   . Pneumonia   . Priapism   . Stenosis, spinal, lumbar     Past Surgical History:  Procedure Laterality Date  . ABDOMINAL SURGERY    . ANKLE SURGERY    . ANTERIOR LAT LUMBAR FUSION Left 02/24/2016   Procedure: LEFT LUMBAR TWO-THREE  ANTERIOR LATERAL LUMBAR FUSION  ;  Surgeon: Earnie Larsson, MD;  Location: [facility];  Service: Neurosurgery;  Laterality: Left;  . BACK SURGERY    . GASTRIC BYPASS    . HERNIA REPAIR    . INCISION AND DRAINAGE ABSCESS Right 07/06/2004   great toe  . IRRIGATION AND DEBRIDEMENT ABSCESS Right 07/08/2004   great toe  . KNEE ARTHROPLASTY Right 04/26/2017   Procedure: RIGHT TOTAL KNEE ARTHROPLASTY WITH COMPUTER NAVIGATION;  Surgeon: Rod Can, MD;  Location: [facility];  Service: Orthopedics;  Laterality: Right;  Needs RNFA  . KNEE SURGERY    . LUMBAR PERCUTANEOUS PEDICLE SCREW 1 LEVEL Left 02/24/2016   Procedure: LUMBAR PERCUTANEOUS PEDICLE SCREW 1 LEVEL;  Surgeon: Earnie Larsson, MD;  Location: [facility];  Service: Neurosurgery;  Laterality: Left;  Marland Kitchen MICRODISCECTOMY LUMBAR Left 08/24/2008   T12 -  L1  . panectomy      Allergies  Allergen Reactions  . Codeine Hives    Hives and itching    Family History  Problem Relation Age of Onset  . Arthritis Mother   . Heart disease Father   . Hypertension Father   . Kidney disease Father      Social History   Socioeconomic History  . Marital status: Married    Spouse name: Not on file  . Number of children: Not on file  . Years of education: Not on file  . Highest education level: Not on file  Social Needs  . Financial resource strain: Not on file  . Food insecurity - worry: Not on file  . Food insecurity - inability: Not on file    . Transportation needs - medical: Not on file  . Transportation needs - non-medical: Not on file  Occupational History  . Not on file  Tobacco Use  . Smoking status: Never Smoker  . Smokeless tobacco: Never Used  Substance and Sexual Activity  . Alcohol use: No  . Drug use: No  . Sexual activity: Yes  Other Topics Concern  . Not on file  Social History Narrative  . Not on file     PHYSICAL EXAM:  VS: BP (!) 160/110 (BP Location: Right Arm, Patient Position: Sitting, Cuff Size: Normal)   Pulse 79   Wt 230 lb 0.6 oz (104.3 kg)   BMI 33.01 kg/m  Physical Exam Gen: NAD, alert, cooperative with exam, well-appearing ENT: normal lips, normal nasal mucosa,  Eye: normal EOM, normal conjunctiva and lids CV:  no edema, +2 pedal pulses   Resp: no accessory muscle use, non-labored,  Skin: no rashes, no areas of induration  Neuro: normal tone, normal sensation to touch Psych:  normal insight, alert and oriented MSK:  Right hip: TTP over the greater trochanter  Normal IR and ER of the hip  Normal strength to resistance with hip flexion  Negative SLR b/l  Walking with a rolling walker  Neurovascularly intact  Right knee with healing vertical incision    Aspiration/Injection Procedure Note Bradley [name] 1970/06/17  Procedure: Injection Indications: right hip pain   Procedure Details Consent: Risks of procedure as well as the alternatives and risks of each were explained to the (patient/caregiver).  Consent for procedure obtained. Time Out: Verified patient identification, verified procedure, site/side was marked, verified correct patient position, special equipment/implants available, medications/allergies/relevent history reviewed, required imaging and test results available.  Performed.  The area was cleaned with iodine and alcohol swabs.    The right Greater trochanter bursa was injected using 1 cc's of 40 mg Depomedrol and 4 cc's of 1% lidocaine with a 22 1 1/2" needle.   Ultrasound was used. Images were obtained in Transverse views showing the injection.    A sterile dressing was applied.  Patient did tolerate procedure well.         ASSESSMENT & PLAN:   Right hip pain Possible that he has a change in his gait as a result of his total knee arthroplasty. Could be greater trochanter bursitis as well as gluteus medius syndrome. He has a history of multiple fusions of his lower back so could be radicular in nature. - greater trochanteric bursa injection today - pennsaid - if no improvement would try gabapentin for radicular type symptoms. Could try imaging of the hip as well.

## 2017-05-14 NOTE — Patient Instructions (Signed)
Please try the exercises if you are able.  Please let me know if you have any improvement of your symptoms.  Please try Aspercreme with lidocaine or the Pennsaid.

## 2017-05-14 NOTE — Assessment & Plan Note (Signed)
Possible that he has a change in his gait as a result of his total knee arthroplasty. Could be greater trochanter bursitis as well as gluteus medius syndrome. He has a history of multiple fusions of his lower back so could be radicular in nature. - greater trochanteric bursa injection today - pennsaid - if no improvement would try gabapentin for radicular type symptoms. Could try imaging of the hip as well.

## 2017-05-15 DIAGNOSIS — M25561 Pain in right knee: Secondary | ICD-10-CM | POA: Diagnosis not present

## 2017-05-17 DIAGNOSIS — M25561 Pain in right knee: Secondary | ICD-10-CM | POA: Diagnosis not present

## 2017-05-21 DIAGNOSIS — M25561 Pain in right knee: Secondary | ICD-10-CM | POA: Diagnosis not present

## 2017-05-24 DIAGNOSIS — M25561 Pain in right knee: Secondary | ICD-10-CM | POA: Diagnosis not present

## 2017-05-31 DIAGNOSIS — M25561 Pain in right knee: Secondary | ICD-10-CM | POA: Diagnosis not present

## 2017-06-05 DIAGNOSIS — M5416 Radiculopathy, lumbar region: Secondary | ICD-10-CM | POA: Diagnosis not present

## 2017-06-08 DIAGNOSIS — Z96651 Presence of right artificial knee joint: Secondary | ICD-10-CM | POA: Diagnosis not present

## 2017-06-08 DIAGNOSIS — Z471 Aftercare following joint replacement surgery: Secondary | ICD-10-CM | POA: Diagnosis not present

## 2017-06-12 ENCOUNTER — Other Ambulatory Visit: Payer: Self-pay | Admitting: Neurosurgery

## 2017-06-12 DIAGNOSIS — M6088 Other myositis, other site: Secondary | ICD-10-CM | POA: Diagnosis not present

## 2017-06-12 DIAGNOSIS — M545 Low back pain: Secondary | ICD-10-CM | POA: Diagnosis not present

## 2017-06-12 DIAGNOSIS — G894 Chronic pain syndrome: Secondary | ICD-10-CM | POA: Diagnosis not present

## 2017-06-12 DIAGNOSIS — M5442 Lumbago with sciatica, left side: Secondary | ICD-10-CM | POA: Diagnosis not present

## 2017-06-12 DIAGNOSIS — Z79891 Long term (current) use of opiate analgesic: Secondary | ICD-10-CM | POA: Diagnosis not present

## 2017-06-12 DIAGNOSIS — M961 Postlaminectomy syndrome, not elsewhere classified: Secondary | ICD-10-CM | POA: Diagnosis not present

## 2017-06-12 DIAGNOSIS — M5416 Radiculopathy, lumbar region: Secondary | ICD-10-CM

## 2017-06-14 ENCOUNTER — Ambulatory Visit
Admission: RE | Admit: 2017-06-14 | Discharge: 2017-06-14 | Disposition: A | Discharge status: Home / Self Care | Payer: BLUE CROSS/BLUE SHIELD | Source: Ambulatory Visit | Attending: Neurosurgery | Admitting: Neurosurgery

## 2017-06-14 DIAGNOSIS — M5416 Radiculopathy, lumbar region: Secondary | ICD-10-CM

## 2017-06-14 DIAGNOSIS — M4802 Spinal stenosis, cervical region: Secondary | ICD-10-CM | POA: Diagnosis not present

## 2017-06-14 MED ORDER — GADOBENATE DIMEGLUMINE 529 MG/ML IV SOLN
20.0000 mL | Freq: Once | INTRAVENOUS | Status: AC | PRN
Start: 2017-06-14 — End: 2017-06-14
  Administered 2017-06-14: 20 mL via INTRAVENOUS

## 2017-06-18 ENCOUNTER — Encounter: Payer: Self-pay | Admitting: Internal Medicine

## 2017-06-19 ENCOUNTER — Other Ambulatory Visit: Payer: Self-pay | Admitting: Internal Medicine

## 2017-06-19 DIAGNOSIS — G8929 Other chronic pain: Secondary | ICD-10-CM | POA: Insufficient documentation

## 2017-06-19 DIAGNOSIS — I10 Essential (primary) hypertension: Secondary | ICD-10-CM

## 2017-06-19 DIAGNOSIS — G4701 Insomnia due to medical condition: Secondary | ICD-10-CM

## 2017-06-19 MED ORDER — AZILSARTAN-CHLORTHALIDONE 40-25 MG PO TABS: 1.0000 | ORAL_TABLET | Freq: Every day | ORAL | 1 refills | Status: DC

## 2017-06-19 MED ORDER — ESZOPICLONE 3 MG PO TABS: 3.0000 mg | ORAL_TABLET | Freq: Every day | ORAL | 1 refills | Status: DC

## 2017-06-21 DIAGNOSIS — M5416 Radiculopathy, lumbar region: Secondary | ICD-10-CM | POA: Diagnosis not present

## 2017-06-21 DIAGNOSIS — Z6832 Body mass index (BMI) 32.0-32.9, adult: Secondary | ICD-10-CM | POA: Diagnosis not present

## 2017-06-21 DIAGNOSIS — R030 Elevated blood-pressure reading, without diagnosis of hypertension: Secondary | ICD-10-CM | POA: Diagnosis not present

## 2017-06-28 ENCOUNTER — Telehealth: Payer: Self-pay

## 2017-06-28 NOTE — Telephone Encounter (Signed)
Key: [id number]

## 2017-07-09 DIAGNOSIS — M5126 Other intervertebral disc displacement, lumbar region: Secondary | ICD-10-CM | POA: Diagnosis not present

## 2017-07-09 DIAGNOSIS — M9983 Other biomechanical lesions of lumbar region: Secondary | ICD-10-CM | POA: Diagnosis not present

## 2017-07-09 DIAGNOSIS — M5416 Radiculopathy, lumbar region: Secondary | ICD-10-CM | POA: Diagnosis not present

## 2017-07-09 DIAGNOSIS — M961 Postlaminectomy syndrome, not elsewhere classified: Secondary | ICD-10-CM | POA: Diagnosis not present

## 2017-07-16 NOTE — Telephone Encounter (Signed)
Calling rx plan for determination.

## 2017-07-18 DIAGNOSIS — M5416 Radiculopathy, lumbar region: Secondary | ICD-10-CM | POA: Diagnosis not present

## 2017-07-18 DIAGNOSIS — M5126 Other intervertebral disc displacement, lumbar region: Secondary | ICD-10-CM | POA: Diagnosis not present

## 2017-07-31 NOTE — Telephone Encounter (Signed)
PA has been approved.  Mychart message sent to patient informing of same.

## 2017-08-07 ENCOUNTER — Encounter: Payer: Self-pay | Admitting: Internal Medicine

## 2017-08-07 ENCOUNTER — Other Ambulatory Visit: Payer: Self-pay | Admitting: Internal Medicine

## 2017-08-07 MED ORDER — SCOPOLAMINE 1 MG/3DAYS TD PT72: 1.0000 | MEDICATED_PATCH | TRANSDERMAL | 0 refills | Status: DC

## 2017-08-29 DIAGNOSIS — M5126 Other intervertebral disc displacement, lumbar region: Secondary | ICD-10-CM | POA: Diagnosis not present

## 2017-08-29 DIAGNOSIS — Z6831 Body mass index (BMI) 31.0-31.9, adult: Secondary | ICD-10-CM | POA: Diagnosis not present

## 2017-08-29 DIAGNOSIS — M5416 Radiculopathy, lumbar region: Secondary | ICD-10-CM | POA: Diagnosis not present

## 2017-09-11 DIAGNOSIS — M545 Low back pain: Secondary | ICD-10-CM | POA: Diagnosis not present

## 2017-09-11 DIAGNOSIS — Z79891 Long term (current) use of opiate analgesic: Secondary | ICD-10-CM | POA: Diagnosis not present

## 2017-09-11 DIAGNOSIS — M1711 Unilateral primary osteoarthritis, right knee: Secondary | ICD-10-CM | POA: Diagnosis not present

## 2017-09-11 DIAGNOSIS — M961 Postlaminectomy syndrome, not elsewhere classified: Secondary | ICD-10-CM | POA: Diagnosis not present

## 2017-09-19 ENCOUNTER — Encounter: Payer: Self-pay | Admitting: Internal Medicine

## 2017-09-19 ENCOUNTER — Ambulatory Visit (INDEPENDENT_AMBULATORY_CARE_PROVIDER_SITE_OTHER): Payer: BLUE CROSS/BLUE SHIELD | Admitting: Internal Medicine

## 2017-09-19 VITALS — BP 120/92 | HR 71 | Temp 98.9°F | Resp 16 | Ht 70.0 in | Wt 210.0 lb

## 2017-09-19 DIAGNOSIS — H00015 Hordeolum externum left lower eyelid: Secondary | ICD-10-CM | POA: Insufficient documentation

## 2017-09-19 DIAGNOSIS — I10 Essential (primary) hypertension: Secondary | ICD-10-CM

## 2017-09-19 DIAGNOSIS — N451 Epididymitis: Secondary | ICD-10-CM

## 2017-09-19 MED ORDER — DOXYCYCLINE MONOHYDRATE 100 MG PO TABS: 100.0000 mg | ORAL_TABLET | Freq: Two times a day (BID) | ORAL | 0 refills | Status: AC

## 2017-09-19 NOTE — Patient Instructions (Signed)
Epididymitis Epididymitis is swelling (inflammation) of the epididymis. The epididymis is a cord-like structure that is located along the top and back part of the testicle. It collects and stores sperm from the testicle. This condition can also cause pain and swelling of the testicle and scrotum. Symptoms usually start suddenly (acute epididymitis). Sometimes epididymitis starts gradually and lasts for a while (chronic epididymitis). This type may be harder to treat. What are the causes? In men 15 and younger, this condition is usually caused by a bacterial infection or sexually transmitted disease (STD), such as:  Gonorrhea.  Chlamydia.  In men 34 and older who do not have anal sex, this condition is usually caused by bacteria from a blockage or abnormalities in the urinary system. These can result from:  Having a tube placed into the bladder (urinary catheter).  Having an enlarged or inflamed prostate gland.  Having recent urinary tract surgery.  In men who have a condition that weakens the body's defense system (immune system), such as HIV, this condition can be caused by:  Other bacteria, including tuberculosis and syphilis.  Viruses.  Fungi.  Sometimes this condition occurs without infection. That may happen if urine flows backward into the epididymis after heavy lifting or straining. What increases the risk? This condition is more likely to develop in men:  Who have unprotected sex with more than one partner.  Who have anal sex.  Who have recently had surgery.  Who have a urinary catheter.  Who have urinary problems.  Who have a suppressed immune system.  What are the signs or symptoms? This condition usually begins suddenly with chills, fever, and pain behind the scrotum and in the testicle. Other symptoms include:  Swelling of the scrotum, testicle, or both.  Pain whenejaculatingor urinating.  Pain in the back or belly.  Nausea.  Itching and discharge  from the penis.  Frequent need to pass urine.  Redness and tenderness of the scrotum.  How is this diagnosed? Your health care provider can diagnose this condition based on your symptoms and medical history. Your health care provider will also do a physical exam to ask about your symptoms and check your scrotum and testicle for swelling, pain, and redness. You may also have other tests, including:  Examination of discharge from the penis.  Urine tests for infections, such as STDs.  Your health care provider may test you for other STDs, including HIV. How is this treated? Treatment for this condition depends on the cause. If your condition is caused by a bacterial infection, oral antibiotic medicine may be prescribed. If the bacterial infection has spread to your blood, you may need to receive IV antibiotics. Nonbacterial epididymitis is treated with home care that includes bed rest and elevation of the scrotum. Surgery may be needed to treat:  Bacterial epididymitis that causes pus to build up in the scrotum (abscess).  Chronic epididymitis that has not responded to other treatments.  Follow these instructions at home: Medicines  Take over-the-counter and prescription medicines only as told by your health care provider.  If you were prescribed an antibiotic medicine, take it as told by your health care provider. Do not stop taking the antibiotic even if your condition improves. Sexual Activity  If your epididymitis was caused by an STD, avoid sexual activity until your treatment is complete.  Inform your sexual partner or partners if you test positive for an STD. They may need to be treated.Do not engage in sexual activity with your partner or  partners until their treatment is completed. General instructions  Return to your normal activities as told by your health care provider. Ask your health care provider what activities are safe for you.  Keep your scrotum elevated and  supported while resting. Ask your health care provider if you should wear a scrotal support, such as a jockstrap. Wear it as told by your health care provider.  If directed, apply ice to the affected area: ? Put ice in a plastic bag. ? Place a towel between your skin and the bag. ? Leave the ice on for 20 minutes, 2-3 times per day.  Try taking a sitz bath to help with discomfort. This is a warm water bath that is taken while you are sitting down. The water should only come up to your hips and should cover your buttocks. Do this 3-4 times per day or as told by your health care provider.  Keep all follow-up visits as told by your health care provider. This is important. Contact a health care provider if:  You have a fever.  Your pain medicine is not helping.  Your pain is getting worse.  Your symptoms do not improve within three days. This information is not intended to replace advice given to you by your health care provider. Make sure you discuss any questions you have with your health care provider. Document Released: 02/18/2000 Document Revised: 07/29/2015 Document Reviewed: 07/08/2014 Elsevier Interactive Patient Education  2018 [employer].

## 2017-09-19 NOTE — Progress Notes (Signed)
Subjective:  Patient ID: Bradley [name], male    DOB: 1970-09-17  Age: 47 y.o. MRN: [id number]  CC: Stye and Groin Pain   HPI LEEAM CEDRONE presents for 2 concerns.  1. He has had a pustule over his left lower eyelid for 5 days.  2. He has had a 2 day hx of mild discomfort in his right groin/scrotum.  Outpatient Medications Prior to Visit  Medication Sig Dispense Refill  . aspirin 81 MG chewable tablet Chew 1 tablet (81 mg total) by mouth 2 (two) times daily. 6 tablet 1  . Azilsartan-Chlorthalidone (EDARBYCLOR) 40-25 MG TABS Take 1 tablet by mouth daily. 90 tablet 1  . Cholecalciferol 2000 units TABS Take 1 tablet (2,000 Units total) by mouth daily. 90 tablet 1  . Diclofenac Sodium (PENNSAID) 2 % SOLN Place 1 application onto the skin 2 (two) times daily. 1 Bottle 3  . docusate sodium (COLACE) 100 MG capsule Take 1 capsule (100 mg total) by mouth 2 (two) times daily. 60 capsule 1  . Eszopiclone 3 MG TABS Take 1 tablet (3 mg total) by mouth at bedtime. Take immediately before bedtime 90 tablet 1  . Multiple Vitamin (MULTIVITAMIN) tablet Take 1 tablet by mouth daily.    . ondansetron (ZOFRAN) 4 MG tablet Take 1 tablet (4 mg total) by mouth every 6 (six) hours as needed for nausea. 20 tablet 0  . oxyCODONE (OXY IR/ROXICODONE) 5 MG immediate release tablet Take 2-3 tablets (10-15 mg total) by mouth every 3 (three) hours as needed. 90 tablet 0  . scopolamine (TRANSDERM-SCOP, 1.5 MG,) 1 MG/3DAYS Place 1 patch (1.5 mg total) onto the skin every 3 (three) days. 4 patch 0  . senna (SENOKOT) 8.6 MG TABS tablet Take 2 tablets (17.2 mg total) by mouth at bedtime. 120 each 0  . zinc gluconate 50 MG tablet Take 1 tablet (50 mg total) by mouth daily. 90 tablet 3   No facility-administered medications prior to visit.     ROS Review of Systems  Constitutional: Negative.  Negative for chills, fatigue and fever.  HENT: Negative.   Eyes: Negative for visual disturbance.  Respiratory: Negative  for cough, chest tightness and wheezing.   Cardiovascular: Negative for chest pain, palpitations and leg swelling.  Gastrointestinal: Negative for abdominal pain, diarrhea and nausea.  Endocrine: Negative.   Genitourinary: Positive for testicular pain. Negative for decreased urine volume, difficulty urinating, discharge, dysuria, flank pain, frequency, genital sores, hematuria, penile pain, penile swelling, scrotal swelling and urgency.  Musculoskeletal: Negative.   Skin: Negative for color change.  Neurological: Negative.   Hematological: Negative for adenopathy. Does not bruise/bleed easily.  Psychiatric/Behavioral: Negative.     Objective:  BP (!) 120/92 (BP Location: Left Arm, Patient Position: Sitting, Cuff Size: Normal)   Pulse 71   Temp 98.9 F (37.2 C) (Oral)   Ht 5' 10"  (1.778 m)   Wt 210 lb (95.3 kg)   SpO2 98%   BMI 30.13 kg/m   BP Readings from Last 3 Encounters:  09/19/17 (!) 120/92  05/14/17 (!) 160/110  04/27/17 108/81    Wt Readings from Last 3 Encounters:  09/19/17 210 lb (95.3 kg)  05/14/17 230 lb 0.6 oz (104.3 kg)  04/26/17 233 lb (105.7 kg)    Physical Exam  Constitutional: He is oriented to person, place, and time. No distress.  HENT:  Mouth/Throat: Oropharynx is clear and moist. No oropharyngeal exudate.  Eyes: Conjunctivae are normal. No scleral icterus.    Neck:  Normal range of motion. Neck supple. No JVD present. No thyromegaly present.  Cardiovascular: Normal rate, regular rhythm and normal heart sounds.  Pulmonary/Chest: Effort normal and breath sounds normal. He has no wheezes. He has no rales.  Abdominal: Soft. Bowel sounds are normal. Hernia confirmed negative in the right inguinal area and confirmed negative in the left inguinal area.  Genitourinary: Penis normal. Right testis shows swelling and tenderness. Right testis shows no mass. Right testis is descended. Left testis shows no mass, no swelling and no tenderness. Left testis is  descended. Circumcised. No penile erythema or penile tenderness. No discharge found.  Genitourinary Comments: Rt epididymis is mildly tender and swollen  Musculoskeletal: Normal range of motion. He exhibits no edema, tenderness or deformity.  Lymphadenopathy:    He has no cervical adenopathy. No inguinal adenopathy noted on the right or left side.  Neurological: He is alert and oriented to person, place, and time.  Skin: He is not diaphoretic.  Vitals reviewed.   Lab Results  Component Value Date   WBC 12.3 (H) 04/27/2017   HGB 10.3 (L) 04/27/2017   HCT 31.4 (L) 04/27/2017   PLT 242 04/27/2017   GLUCOSE 119 (H) 04/27/2017   CHOL 137 09/05/2016   TRIG 133.0 09/05/2016   HDL 43.90 09/05/2016   LDLCALC 67 09/05/2016   ALT 16 09/05/2016   AST 15 09/05/2016   NA 134 (L) 04/27/2017   K 4.5 04/27/2017   CL 105 04/27/2017   CREATININE 0.81 04/27/2017   BUN 10 04/27/2017   CO2 22 04/27/2017   TSH 1.92 04/24/2017   HGBA1C 4.9 10/05/2009    Mr Lumbar Spine W Wo Contrast  Result Date: 06/14/2017 CLINICAL DATA:  Lumbar radiculopathy. Right hip and leg pain with numbness. EXAM: MRI LUMBAR SPINE WITHOUT AND WITH CONTRAST TECHNIQUE: Multiplanar and multiecho pulse sequences of the lumbar spine were obtained without and with intravenous contrast. CONTRAST:  9m MULTIHANCE GADOBENATE DIMEGLUMINE 529 MG/ML IV SOLN COMPARISON:  12/02/2014 FINDINGS: Segmentation:  5 lumbar type vertebral bodies. Alignment:  Mild dextrocurvature. Vertebrae: No fracture, evidence of discitis, or aggressive bone lesion. Small T12 hemangioma Conus medullaris and cauda equina: Conus extends to the T12-L1 level. Conus and cauda equina appear normal. Paraspinal and other soft tissues: Negative Disc levels: T12- L1: Spondylosis without visible impingement L1-L2: Spondylosis. No interval adjacent segment degeneration. No impingement L2-L3: Interval X lif with improved left foraminal patency. Probable solid interbody  arthrodesis. L3-L4: Disc narrowing and bulging that is stable. Bulge contacts but does not exert mass effect on the left foraminal and extraforaminal L3 nerve root. Patent spinal canal L4-L5: Degenerative facet spurring greater on the left. Mild disc narrowing and bulging. No visible impingement L5-S1:Mild facet spurring.  Negative disc.  No impingement. IMPRESSION: 1. No impingement to specifically explain right leg symptoms. 2. L2-3 fusion since 2016 comparison with improved left foraminal patency. 3. No interval progressive adjacent segment disease. 4. Mild dextroscoliosis. Electronically Signed   By: JMonte FantasiaM.D.   On: 06/14/2017 09:38    Assessment & Plan:   BHardenwas seen today for stye and groin pain.  Diagnoses and all orders for this visit:  Epididymitis, right- he will apply warm compresses, will start doxy -     doxycycline (ADOXA) 100 MG tablet; Take 1 tablet (100 mg total) by mouth 2 (two) times daily for 10 days.  Hordeolum externum of left lower eyelid -     doxycycline (ADOXA) 100 MG tablet; Take 1 tablet (100 mg  total) by mouth 2 (two) times daily for 10 days.  Essential hypertension- his BP is adequately well controlled   I am having Aaron Edelman K. Ebony Hail start on doxycycline. I am also having him maintain his zinc gluconate, multivitamin, Cholecalciferol, aspirin, docusate sodium, ondansetron, oxyCODONE, senna, Diclofenac Sodium, Eszopiclone, Azilsartan-Chlorthalidone, and scopolamine.  Meds ordered this encounter  Medications  . doxycycline (ADOXA) 100 MG tablet    Sig: Take 1 tablet (100 mg total) by mouth 2 (two) times daily for 10 days.    Dispense:  20 tablet    Refill:  0     Follow-up: Return in about 3 weeks (around 10/10/2017).  [name], MD

## 2017-09-27 ENCOUNTER — Encounter: Payer: Self-pay | Admitting: Family Medicine

## 2017-09-27 ENCOUNTER — Ambulatory Visit (INDEPENDENT_AMBULATORY_CARE_PROVIDER_SITE_OTHER): Payer: BLUE CROSS/BLUE SHIELD | Admitting: Family Medicine

## 2017-09-27 VITALS — BP 120/80 | HR 77 | Ht 70.0 in | Wt 218.0 lb

## 2017-09-27 DIAGNOSIS — M25511 Pain in right shoulder: Secondary | ICD-10-CM | POA: Diagnosis not present

## 2017-09-27 MED ORDER — TRIAMCINOLONE ACETONIDE 40 MG/ML IJ SUSP
40.0000 mg | Freq: Once | INTRAMUSCULAR | Status: AC
  Administered 2017-09-27: 40 mg via INTRA_ARTICULAR

## 2017-09-27 NOTE — Assessment & Plan Note (Signed)
Reports a history of right rotator cuff arthropathy and appears to have capsulitis on exam today.  Has been receiving injections in his shoulder to avoid having to have a shoulder replacement. -Glenohumeral injection today. -Counseled on supportive care -Can follow-up as needed.

## 2017-09-27 NOTE — Patient Instructions (Signed)
Good to see you  Have fun at the beach  Try to ice the shoulder  Take tylenol 650 mg three times a day is the best evidence based medicine we have for arthritis.  Glucosamine sulfate 728m twice a day is a supplement that has been shown to help moderate to severe arthritis. Vitamin D 2000 IU daily Fish oil 2 grams daily.  Tumeric 5031mtwice daily.

## 2017-09-27 NOTE — Progress Notes (Signed)
GERSHOM BROBECK - 47 y.o. male MRN [id number]  Date of birth: [birth year]  SUBJECTIVE:  Including CC & ROS.  No chief complaint on file.   JAKOBI THETFORD is a 47 y.o. male that is presenting with right shoulder pain.  There is acute on chronic in nature.  This pain stems from a fall sometime ago.  After that injury he was evaluated in the emergency room and told that he needed to have rotator cuff surgery.  Since that time his shoulder is never felt the same.  The pain is localized to the shoulder.  He usually gets injections from his pain management doctor.  He has not received an injection in the past 4 to 5 months.  Denies any radicular symptoms.  Pain is severe when raising above his head.  He is not able to reach clear above his head.  Denies any numbness or tingling.  Independent review of the right shoulder x-ray from 2012 shows normal joint space appearance.   Review of Systems  Constitutional: Negative for fever.  HENT: Negative for congestion.   Respiratory: Negative for cough.   Cardiovascular: Negative for chest pain.  Gastrointestinal: Negative for abdominal pain.  Musculoskeletal: Positive for back pain. Negative for gait problem.  Skin: Negative for color change.  Neurological: Negative for weakness.  Hematological: Negative for adenopathy.  Psychiatric/Behavioral: Negative for agitation.    HISTORY: Past Medical, Surgical, Social, and Family History Reviewed & Updated per EMR.   Pertinent Historical Findings include:  Past Medical History:  Diagnosis Date  . Anemia    since gastric bypass  . Anxiety   . Aortic valve sclerosis   . Arthritis   . Arthropathy, unspecified, other specified sites   . CAD (coronary artery disease)   . Contact with or exposure to venereal diseases   . Depression   . Displacement of intervertebral disc, site unspecified, without myelopathy   . GERD (gastroesophageal reflux disease)   . Heart murmur    " slight, Crocker said nothing to  worry about."  . History of kidney stones   . Hypertension     " no longer have hypertension since gastric bypass "  . Lumbago   . Obesity, unspecified   . Other, mixed, or unspecified nondependent drug abuse, unspecified   . Personal history of urinary calculi   . Pneumonia   . Priapism   . Stenosis, spinal, lumbar     Past Surgical History:  Procedure Laterality Date  . ABDOMINAL SURGERY    . ANKLE SURGERY    . ANTERIOR LAT LUMBAR FUSION Left 02/24/2016   Procedure: LEFT LUMBAR TWO-THREE  ANTERIOR LATERAL LUMBAR FUSION  ;  Surgeon: Earnie Larsson, MD;  Location: [facility];  Service: Neurosurgery;  Laterality: Left;  . BACK SURGERY    . GASTRIC BYPASS    . HERNIA REPAIR    . INCISION AND DRAINAGE ABSCESS Right 07/06/2004   great toe  . IRRIGATION AND DEBRIDEMENT ABSCESS Right 07/08/2004   great toe  . KNEE ARTHROPLASTY Right 04/26/2017   Procedure: RIGHT TOTAL KNEE ARTHROPLASTY WITH COMPUTER NAVIGATION;  Surgeon: Rod Can, MD;  Location: [facility];  Service: Orthopedics;  Laterality: Right;  Needs RNFA  . KNEE SURGERY    . LUMBAR PERCUTANEOUS PEDICLE SCREW 1 LEVEL Left 02/24/2016   Procedure: LUMBAR PERCUTANEOUS PEDICLE SCREW 1 LEVEL;  Surgeon: Earnie Larsson, MD;  Location: [facility];  Service: Neurosurgery;  Laterality: Left;  Marland Kitchen MICRODISCECTOMY LUMBAR Left 08/24/2008   T12 -  L1  . panectomy      Allergies  Allergen Reactions  . Codeine Hives    Hives and itching    Family History  Problem Relation Age of Onset  . Arthritis Mother   . Heart disease Father   . Hypertension Father   . Kidney disease Father      Social History   Socioeconomic History  . Marital status: Married    Spouse name: Not on file  . Number of children: Not on file  . Years of education: Not on file  . Highest education level: Not on file  Occupational History  . Not on file  Social Needs  . Financial resource strain: Not on file  . Food insecurity:    Worry: Not on file    Inability: Not on file    . Transportation needs:    Medical: Not on file    Non-medical: Not on file  Tobacco Use  . Smoking status: Never Smoker  . Smokeless tobacco: Never Used  Substance and Sexual Activity  . Alcohol use: No  . Drug use: No  . Sexual activity: Yes  Lifestyle  . Physical activity:    Days per week: Not on file    Minutes per session: Not on file  . Stress: Not on file  Relationships  . Social connections:    Talks on phone: Not on file    Gets together: Not on file    Attends religious service: Not on file    Active member of club or organization: Not on file    Attends meetings of clubs or organizations: Not on file    Relationship status: Not on file  . Intimate partner violence:    Fear of current or ex partner: Not on file    Emotionally abused: Not on file    Physically abused: Not on file    Forced sexual activity: Not on file  Other Topics Concern  . Not on file  Social History Narrative  . Not on file     PHYSICAL EXAM:  VS: BP 120/80 (BP Location: Left Arm, Patient Position: Sitting, Cuff Size: Large)   Pulse 77   Ht 5' 10"  (1.778 m)   Wt 218 lb (98.9 kg)   SpO2 99%   BMI 31.28 kg/m  Physical Exam Gen: NAD, alert, cooperative with exam, well-appearing ENT: normal lips, normal nasal mucosa,  Eye: normal EOM, normal conjunctiva and lids CV:  no edema, +2 pedal pulses   Resp: no accessory muscle use, non-labored,  Skin: no rashes, no areas of induration  Neuro: normal tone, normal sensation to touch Psych:  normal insight, alert and oriented MSK:  Right Shoulder: Inspection reveals no abnormalities, atrophy or asymmetry. Palpation is normal with no tenderness over AC joint  Limited active flexion abduction. Normal passive range of motion. Pain with ER and abduction  Normal internal and external rotation strength resistance Mild pain with Hawkin's tests, empty can sign. Neurovascularly intact.    Aspiration/Injection Procedure Note NEHAN FLAUM 12/22/1970  Procedure: Injection Indications: Right shoulder pain   Procedure Details Consent: Risks of procedure as well as the alternatives and risks of each were explained to the (patient/caregiver).  Consent for procedure obtained. Time Out: Verified patient identification, verified procedure, site/side was marked, verified correct patient position, special equipment/implants available, medications/allergies/relevent history reviewed, required imaging and test results available.  Performed.  The area was cleaned with iodine and alcohol swabs.    The right [facility]  joint was injected using 1 cc's of 40 mg Kenalog and 4 cc's of 0.5% bupivacaine with a 25 1 1/2" needle.  Ultrasound was used. Images were obtained in Long views showing the injection.    A sterile dressing was applied.  Patient did tolerate procedure well.        ASSESSMENT & PLAN:   Right shoulder pain Reports a history of right rotator cuff arthropathy and appears to have capsulitis on exam today.  Has been receiving injections in his shoulder to avoid having to have a shoulder replacement. -Glenohumeral injection today. -Counseled on supportive care -Can follow-up as needed.

## 2017-10-03 ENCOUNTER — Encounter: Payer: Medicare Other | Admitting: Internal Medicine

## 2017-10-07 ENCOUNTER — Encounter: Payer: Self-pay | Admitting: Internal Medicine

## 2017-10-08 NOTE — Telephone Encounter (Signed)
Can you help with pt concern in PCP absence?

## 2017-10-09 ENCOUNTER — Other Ambulatory Visit: Payer: Self-pay | Admitting: Internal Medicine

## 2017-10-09 DIAGNOSIS — N5089 Other specified disorders of the male genital organs: Secondary | ICD-10-CM

## 2017-10-24 ENCOUNTER — Encounter: Payer: Self-pay | Admitting: Internal Medicine

## 2017-10-24 ENCOUNTER — Encounter: Payer: Medicare Other | Admitting: Internal Medicine

## 2017-10-24 ENCOUNTER — Ambulatory Visit
Admission: RE | Admit: 2017-10-24 | Discharge: 2017-10-24 | Disposition: A | Discharge status: Home / Self Care | Payer: BLUE CROSS/BLUE SHIELD | Source: Ambulatory Visit | Attending: Internal Medicine | Admitting: Internal Medicine

## 2017-10-24 DIAGNOSIS — N433 Hydrocele, unspecified: Secondary | ICD-10-CM | POA: Diagnosis not present

## 2017-10-24 DIAGNOSIS — N5089 Other specified disorders of the male genital organs: Secondary | ICD-10-CM

## 2017-10-25 ENCOUNTER — Ambulatory Visit (INDEPENDENT_AMBULATORY_CARE_PROVIDER_SITE_OTHER): Payer: BLUE CROSS/BLUE SHIELD | Admitting: Internal Medicine

## 2017-10-25 ENCOUNTER — Encounter: Payer: Self-pay | Admitting: Internal Medicine

## 2017-10-25 ENCOUNTER — Other Ambulatory Visit (INDEPENDENT_AMBULATORY_CARE_PROVIDER_SITE_OTHER): Payer: BLUE CROSS/BLUE SHIELD

## 2017-10-25 VITALS — BP 124/76 | HR 65 | Temp 97.8°F | Resp 16 | Ht 70.0 in | Wt 224.8 lb

## 2017-10-25 DIAGNOSIS — E785 Hyperlipidemia, unspecified: Secondary | ICD-10-CM

## 2017-10-25 DIAGNOSIS — E559 Vitamin D deficiency, unspecified: Secondary | ICD-10-CM | POA: Diagnosis not present

## 2017-10-25 DIAGNOSIS — E60 Dietary zinc deficiency: Secondary | ICD-10-CM

## 2017-10-25 DIAGNOSIS — D510 Vitamin B12 deficiency anemia due to intrinsic factor deficiency: Secondary | ICD-10-CM

## 2017-10-25 DIAGNOSIS — Z9884 Bariatric surgery status: Secondary | ICD-10-CM

## 2017-10-25 DIAGNOSIS — D508 Other iron deficiency anemias: Secondary | ICD-10-CM | POA: Diagnosis not present

## 2017-10-25 DIAGNOSIS — I10 Essential (primary) hypertension: Secondary | ICD-10-CM | POA: Diagnosis not present

## 2017-10-25 DIAGNOSIS — R739 Hyperglycemia, unspecified: Secondary | ICD-10-CM

## 2017-10-25 DIAGNOSIS — Z0000 Encounter for general adult medical examination without abnormal findings: Secondary | ICD-10-CM | POA: Diagnosis not present

## 2017-10-25 DIAGNOSIS — G4701 Insomnia due to medical condition: Secondary | ICD-10-CM

## 2017-10-25 DIAGNOSIS — G8929 Other chronic pain: Secondary | ICD-10-CM

## 2017-10-25 LAB — BASIC METABOLIC PANEL
BUN: 8 mg/dL (ref 6–23)
CO2: 29 mEq/L (ref 19–32)
Calcium: 8.6 mg/dL (ref 8.4–10.5)
Chloride: 101 mEq/L (ref 96–112)
Creatinine, Ser: 0.82 mg/dL (ref 0.40–1.50)
GFR: 106.1 mL/min (ref >60.00)
Glucose, Bld: 97 mg/dL (ref 70–99)
Potassium: 4.6 mEq/L (ref 3.5–5.1)
Sodium: 133 mEq/L — ABNORMAL LOW (ref 135–145)

## 2017-10-25 LAB — CBC WITH DIFFERENTIAL/PLATELET
Basophils Absolute: 0 10*3/uL (ref 0.0–0.1)
Basophils Relative: 0.4 % (ref 0.0–3.0)
Eosinophils Absolute: 0.1 10*3/uL (ref 0.0–0.7)
Eosinophils Relative: 1.9 % (ref 0.0–5.0)
HCT: 29.7 % — ABNORMAL LOW (ref 39.0–52.0)
Hemoglobin: 9.5 g/dL — ABNORMAL LOW (ref 13.0–17.0)
Lymphocytes Relative: 36.4 % (ref 12.0–46.0)
Lymphs Abs: 2.6 10*3/uL (ref 0.7–4.0)
MCHC: 32 g/dL (ref 30.0–36.0)
MCV: 72.3 fl — ABNORMAL LOW (ref 78.0–100.0)
Monocytes Absolute: 0.9 10*3/uL (ref 0.1–1.0)
Monocytes Relative: 13.2 % — ABNORMAL HIGH (ref 3.0–12.0)
Neutro Abs: 3.4 10*3/uL (ref 1.4–7.7)
Neutrophils Relative %: 48.1 % (ref 43.0–77.0)
Platelets: 306 10*3/uL (ref 150.0–400.0)
RBC: 4.1 Mil/uL — ABNORMAL LOW (ref 4.22–5.81)
RDW: 20.7 % — ABNORMAL HIGH (ref 11.5–15.5)
WBC: 7 10*3/uL (ref 4.0–10.5)

## 2017-10-25 LAB — VITAMIN B12: Vitamin B-12: 332 pg/mL (ref 211–911)

## 2017-10-25 LAB — IBC PANEL
Iron: 16 ug/dL — ABNORMAL LOW (ref 42–165)
Saturation Ratios: 3.7 % — ABNORMAL LOW (ref 20.0–50.0)
Transferrin: 312 mg/dL (ref 212.0–360.0)

## 2017-10-25 LAB — LIPID PANEL
Cholesterol: 118 mg/dL (ref 0–200)
HDL: 47.9 mg/dL (ref >39.00)
LDL Cholesterol: 60 mg/dL (ref 0–99)
NonHDL: 70.26
Total CHOL/HDL Ratio: 2
Triglycerides: 52 mg/dL (ref 0.0–149.0)
VLDL: 10.4 mg/dL (ref 0.0–40.0)

## 2017-10-25 LAB — FERRITIN: Ferritin: 7.1 ng/mL — ABNORMAL LOW (ref 22.0–322.0)

## 2017-10-25 LAB — VITAMIN D 25 HYDROXY (VIT D DEFICIENCY, FRACTURES): VITD: 22.38 ng/mL — ABNORMAL LOW (ref 30.00–100.00)

## 2017-10-25 LAB — HEMOGLOBIN A1C: Hgb A1c MFr Bld: 5.7 % (ref 4.6–6.5)

## 2017-10-25 LAB — FOLATE: Folate: 8 ng/mL (ref >5.9)

## 2017-10-25 MED ORDER — CHOLECALCIFEROL 50 MCG (2000 UT) PO TABS: 2.0000 | ORAL_TABLET | Freq: Every day | ORAL | 1 refills | Status: DC

## 2017-10-25 MED ORDER — FERROUS SULFATE 325 (65 FE) MG PO TABS: 325.0000 mg | ORAL_TABLET | Freq: Three times a day (TID) | ORAL | 1 refills | Status: DC

## 2017-10-25 NOTE — Patient Instructions (Signed)
Anemia Anemia is a condition in which you do not have enough red blood cells or hemoglobin. Hemoglobin is a substance in red blood cells that carries oxygen. When you do not have enough red blood cells or hemoglobin (are anemic), your body cannot get enough oxygen and your organs may not work properly. As a result, you may feel very tired or have other problems. What are the causes? Common causes of anemia include:  Excessive bleeding. Anemia can be caused by excessive bleeding inside or outside the body, including bleeding from the intestine or from periods in women.  Poor nutrition.  Long-lasting (chronic) kidney, thyroid, and liver disease.  Bone marrow disorders.  Cancer and treatments for cancer.  HIV (human immunodeficiency virus) and AIDS (acquired immunodeficiency syndrome).  Treatments for HIV and AIDS.  Spleen problems.  Blood disorders.  Infections, medicines, and autoimmune disorders that destroy red blood cells.  What are the signs or symptoms? Symptoms of this condition include:  Minor weakness.  Dizziness.  Headache.  Feeling heartbeats that are irregular or faster than normal (palpitations).  Shortness of breath, especially with exercise.  Paleness.  Cold sensitivity.  Indigestion.  Nausea.  Difficulty sleeping.  Difficulty concentrating.  Symptoms may occur suddenly or develop slowly. If your anemia is mild, you may not have symptoms. How is this diagnosed? This condition is diagnosed based on:  Blood tests.  Your medical history.  A physical exam.  Bone marrow biopsy.  Your health care provider may also check your stool (feces) for blood and may do additional testing to look for the cause of your bleeding. You may also have other tests, including:  Imaging tests, such as a CT scan or MRI.  Endoscopy.  Colonoscopy.  How is this treated? Treatment for this condition depends on the cause. If you continue to lose a lot of blood,  you may need to be treated at a hospital. Treatment may include:  Taking supplements of iron, vitamin B12, or folic acid.  Taking a hormone medicine (erythropoietin) that can help to stimulate red blood cell growth.  Having a blood transfusion. This may be needed if you lose a lot of blood.  Making changes to your diet.  Having surgery to remove your spleen.  Follow these instructions at home:  Take over-the-counter and prescription medicines only as told by your health care provider.  Take supplements only as told by your health care provider.  Follow any diet instructions that you were given.  Keep all follow-up visits as told by your health care provider. This is important. Contact a health care provider if:  You develop new bleeding anywhere in the body. Get help right away if:  You are very weak.  You are short of breath.  You have pain in your abdomen or chest.  You are dizzy or feel faint.  You have trouble concentrating.  You have bloody or black, tarry stools.  You vomit repeatedly or you vomit up blood. Summary  Anemia is a condition in which you do not have enough red blood cells or enough of a substance in your red blood cells that carries oxygen (hemoglobin).  Symptoms may occur suddenly or develop slowly.  If your anemia is mild, you may not have symptoms.  This condition is diagnosed with blood tests as well as a medical history and physical exam. Other tests may be needed.  Treatment for this condition depends on the cause of the anemia. This information is not intended to replace advice   given to you by your health care provider. Make sure you discuss any questions you have with your health care provider. Document Released: 03/30/2004 Document Revised: 03/24/2016 Document Reviewed: 03/24/2016 Elsevier Interactive Patient Education  [employer].

## 2017-10-25 NOTE — Progress Notes (Signed)
Subjective:  Patient ID: Bradley [name], male    DOB: 23-Jan-1971  Age: 47 y.o. MRN: [id number]  CC: Annual Exam; Hypertension; and Anemia   HPI [name] presents for a CPX.  He complains of persistent insomnia.  He complains that Lunesta causes amnesia so he wants to change to something else.  He describes severe difficulty falling asleep and pain throughout the night.  He tells me his blood pressure has been well controlled.  He denies any recent episodes of dizziness, lightheadedness, CP, DOE, palpitations, edema, or fatigue.  Outpatient Medications Prior to Visit  Medication Sig Dispense Refill  . Azilsartan-Chlorthalidone (EDARBYCLOR) 40-25 MG TABS Take 1 tablet by mouth daily. 90 tablet 1  . Diclofenac Sodium (PENNSAID) 2 % SOLN Place 1 application onto the skin 2 (two) times daily. 1 Bottle 3  . Multiple Vitamin (MULTIVITAMIN) tablet Take 1 tablet by mouth daily.    . Oxycodone HCl 10 MG TABS Take 1 tablet by mouth 5 (five) times daily.  0  . senna (SENOKOT) 8.6 MG TABS tablet Take 2 tablets (17.2 mg total) by mouth at bedtime. 120 each 0  . aspirin 81 MG chewable tablet Chew 1 tablet (81 mg total) by mouth 2 (two) times daily. 6 tablet 1  . Cholecalciferol 2000 units TABS Take 1 tablet (2,000 Units total) by mouth daily. 90 tablet 1  . docusate sodium (COLACE) 100 MG capsule Take 1 capsule (100 mg total) by mouth 2 (two) times daily. 60 capsule 1  . Eszopiclone 3 MG TABS Take 1 tablet (3 mg total) by mouth at bedtime. Take immediately before bedtime 90 tablet 1  . ondansetron (ZOFRAN) 4 MG tablet Take 1 tablet (4 mg total) by mouth every 6 (six) hours as needed for nausea. 20 tablet 0  . scopolamine (TRANSDERM-SCOP, 1.5 MG,) 1 MG/3DAYS Place 1 patch (1.5 mg total) onto the skin every 3 (three) days. 4 patch 0  . zinc gluconate 50 MG tablet Take 1 tablet (50 mg total) by mouth daily. 90 tablet 3   No facility-administered medications prior to visit.     ROS Review of  Systems  Constitutional: Negative.  Negative for appetite change, diaphoresis, fatigue and unexpected weight change.  HENT: Negative for sore throat and trouble swallowing.   Eyes: Negative for visual disturbance.  Respiratory: Negative.  Negative for cough, chest tightness and shortness of breath.   Cardiovascular: Negative for chest pain, palpitations and leg swelling.  Gastrointestinal: Negative for abdominal pain, blood in stool, constipation, diarrhea, nausea and vomiting.  Endocrine: Negative.   Genitourinary: Negative.  Negative for difficulty urinating, dysuria, hematuria, penile pain, penile swelling, scrotal swelling and testicular pain.  Musculoskeletal: Positive for arthralgias and back pain. Negative for myalgias and neck pain.  Skin: Negative.  Negative for color change, pallor and rash.  Neurological: Negative.  Negative for dizziness, weakness, light-headedness and headaches.  Hematological: Negative for adenopathy. Does not bruise/bleed easily.  Psychiatric/Behavioral: Positive for sleep disturbance. Negative for behavioral problems, confusion, decreased concentration, dysphoric mood and suicidal ideas. The patient is not nervous/anxious.     Objective:  BP 124/76 (BP Location: Left Arm, Patient Position: Sitting, Cuff Size: Normal)   Pulse 65   Temp 97.8 F (36.6 C) (Oral)   Resp 16   Ht 5' 10"  (1.778 m)   Wt 224 lb 12 oz (101.9 kg)   SpO2 98%   BMI 32.25 kg/m   BP Readings from Last 3 Encounters:  10/25/17 124/76  09/27/17  120/80  09/19/17 (!) 120/92    Wt Readings from Last 3 Encounters:  10/25/17 224 lb 12 oz (101.9 kg)  09/27/17 218 lb (98.9 kg)  09/19/17 210 lb (95.3 kg)    Physical Exam  Constitutional: He is oriented to person, place, and time. No distress.  HENT:  Mouth/Throat: Oropharynx is clear and moist. No oropharyngeal exudate.  Eyes: Conjunctivae are normal. No scleral icterus.  Neck: Normal range of motion. Neck supple. No JVD present. No  thyromegaly present.  Cardiovascular: Normal rate and regular rhythm. Exam reveals no gallop and no friction rub.  Murmur heard.  Systolic murmur is present with a grade of 1/6.  No diastolic murmur is present. Pulmonary/Chest: Effort normal and breath sounds normal. No respiratory distress. He has no wheezes. He has no rales.  Abdominal: Soft. Bowel sounds are normal. He exhibits no mass. There is no tenderness. No hernia.  Musculoskeletal: Normal range of motion. He exhibits no edema, tenderness or deformity.  Lymphadenopathy:    He has no cervical adenopathy.  Neurological: He is alert and oriented to person, place, and time.  Skin: Skin is warm and dry. No rash noted. He is not diaphoretic.  Psychiatric: He has a normal mood and affect. His behavior is normal. Judgment and thought content normal.  Vitals reviewed.   Lab Results  Component Value Date   WBC 7.0 10/25/2017   HGB 9.5 (L) 10/25/2017   HCT 29.7 (L) 10/25/2017   PLT 306.0 10/25/2017   GLUCOSE 97 10/25/2017   CHOL 118 10/25/2017   TRIG 52.0 10/25/2017   HDL 47.90 10/25/2017   LDLCALC 60 10/25/2017   ALT 16 09/05/2016   AST 15 09/05/2016   NA 133 (L) 10/25/2017   K 4.6 10/25/2017   CL 101 10/25/2017   CREATININE 0.82 10/25/2017   BUN 8 10/25/2017   CO2 29 10/25/2017   TSH 1.92 04/24/2017   HGBA1C 5.7 10/25/2017    US Scrotum W/doppler  Result Date: 10/24/2017 CLINICAL DATA:  Scrotal mass and pain EXAM: SCROTAL ULTRASOUND DOPPLER ULTRASOUND OF THE TESTICLES TECHNIQUE: Complete ultrasound examination of the testicles, epididymis, and other scrotal structures was performed. Color and spectral Doppler ultrasound were also utilized to evaluate blood flow to the testicles. COMPARISON:  None. FINDINGS: Right testicle Measurements: 4.5 x 2.4 x 3.0 cm. No mass or microlithiasis visualized. Left testicle Measurements: 4.6 x 2.7 x 2.7 cm. No mass or microlithiasis visualized. Right epididymis:  Normal in size and appearance.  Left epididymis:  Normal in size and appearance. Hydrocele:  Small left hydrocele Varicocele:  None visualized. Pulsed Doppler interrogation of both testes demonstrates normal low resistance arterial and venous waveforms bilaterally. IMPRESSION: No acute abnormality.  Small left hydrocele. Electronically Signed   By: Franchot Gallo M.D.   On: 10/24/2017 15:32    Assessment & Plan:   Hisao was seen today for annual exam, hypertension and anemia.  Diagnoses and all orders for this visit:  Other iron deficiency anemia- He remains anemic with a low iron level.  I have asked him to restart an iron supplement.  I have also asked him to do 3 stool Hemoccult cards.  If any of these are positive then will refer him to GI for work-up. -     Ferritin; Future -     CBC with Differential/Platelet; Future -     IBC panel; Future -     ferrous sulfate 325 (65 FE) MG tablet; Take 1 tablet (325 mg total) by mouth  3 (three) times daily with meals.  Vitamin B12 deficiency anemia due to intrinsic factor deficiency- His B12 level is normal -     Vitamin B12; Future -     Folate; Future -     CBC with Differential/Platelet; Future -     Cancel: Hemoglobin A1c; Future -     Vitamin B1; Future  Essential hypertension- His blood pressure is well controlled.  Electrolytes and renal function are normal. -     Basic metabolic panel; Future  Vitamin D deficiency- His vitamin D level is low.  I have asked him to start a vitamin D supplement. -     VITAMIN D 25 Hydroxy (Vit-D Deficiency, Fractures); Future -     Cholecalciferol 2000 units TABS; Take 2 tablets (4,000 Units total) by mouth daily.  Zinc deficiency- His zinc level remains low.  I have asked him to restart the zinc supplement. -     Zinc; Future -     zinc gluconate 50 MG tablet; Take 1 tablet (50 mg total) by mouth daily.  Hyperglycemia-his A1c is mildly elevated at 5.7%.  He is prediabetic.  Medical therapy is not indicated. -     Basic metabolic  panel; Future -     Hemoglobin A1c; Future  Hyperlipidemia LDL goal <130- His LDL is normal.  Statin therapy is not indicated. -     Lipid panel; Future  Insomnia secondary to chronic pain -     traZODone (DESYREL) 100 MG tablet; Take 1 tablet (100 mg total) by mouth at bedtime as needed for sleep.  Gastric bypass status for obesity -     zinc gluconate 50 MG tablet; Take 1 tablet (50 mg total) by mouth daily.   I have discontinued Shep Porter. Kaucher's aspirin, docusate sodium, ondansetron, Eszopiclone, and scopolamine. I have also changed his Cholecalciferol. Additionally, I am having him start on ferrous sulfate and traZODone. Lastly, I am having him maintain his multivitamin, senna, Diclofenac Sodium, Azilsartan-Chlorthalidone, Oxycodone HCl, and zinc gluconate.  Meds ordered this encounter  Medications  . Cholecalciferol 2000 units TABS    Sig: Take 2 tablets (4,000 Units total) by mouth daily.    Dispense:  180 tablet    Refill:  1  . ferrous sulfate 325 (65 FE) MG tablet    Sig: Take 1 tablet (325 mg total) by mouth 3 (three) times daily with meals.    Dispense:  270 tablet    Refill:  1  . traZODone (DESYREL) 100 MG tablet    Sig: Take 1 tablet (100 mg total) by mouth at bedtime as needed for sleep.    Dispense:  90 tablet    Refill:  1  . zinc gluconate 50 MG tablet    Sig: Take 1 tablet (50 mg total) by mouth daily.    Dispense:  90 tablet    Refill:  1     Follow-up: Return in about 3 months (around 01/25/2018).  [name], MD

## 2017-10-26 ENCOUNTER — Encounter: Payer: Self-pay | Admitting: Internal Medicine

## 2017-10-26 MED ORDER — TRAZODONE HCL 100 MG PO TABS: 100.0000 mg | ORAL_TABLET | Freq: Every evening | ORAL | 1 refills | Status: DC | PRN

## 2017-10-27 ENCOUNTER — Encounter: Payer: Self-pay | Admitting: Internal Medicine

## 2017-10-27 DIAGNOSIS — Z9884 Bariatric surgery status: Secondary | ICD-10-CM

## 2017-10-27 DIAGNOSIS — E60 Dietary zinc deficiency: Secondary | ICD-10-CM

## 2017-10-27 DIAGNOSIS — D508 Other iron deficiency anemias: Secondary | ICD-10-CM

## 2017-10-27 MED ORDER — ZINC GLUCONATE 50 MG PO TABS: 50.0000 mg | ORAL_TABLET | Freq: Every day | ORAL | 1 refills | Status: DC

## 2017-10-27 NOTE — Assessment & Plan Note (Signed)
Exam completed Labs reviewed He refused a flu vaccine Will consider referral for colon cancer screening based on his Hemoccult cards Education material was given.

## 2017-10-29 LAB — VITAMIN B1: Vitamin B1 (Thiamine): 12 nmol/L (ref 8–30)

## 2017-10-29 LAB — ZINC: Zinc: 47 ug/dL — ABNORMAL LOW (ref 60–130)

## 2017-10-29 MED ORDER — ZINC GLUCONATE 50 MG PO TABS: 50.0000 mg | ORAL_TABLET | Freq: Every day | ORAL | 1 refills | Status: DC

## 2017-11-13 ENCOUNTER — Ambulatory Visit (INDEPENDENT_AMBULATORY_CARE_PROVIDER_SITE_OTHER): Payer: BLUE CROSS/BLUE SHIELD | Admitting: Family Medicine

## 2017-11-13 ENCOUNTER — Ambulatory Visit: Payer: Self-pay

## 2017-11-13 ENCOUNTER — Encounter: Payer: Self-pay | Admitting: Family Medicine

## 2017-11-13 VITALS — BP 134/76 | HR 66 | Ht 70.0 in | Wt 229.0 lb

## 2017-11-13 DIAGNOSIS — M25511 Pain in right shoulder: Secondary | ICD-10-CM

## 2017-11-13 NOTE — Patient Instructions (Signed)
Good to see you  Please try to ice the shoulder  Please try to take it easy for the next couple of days.

## 2017-11-13 NOTE — Progress Notes (Signed)
ESTER MABE - 47 y.o. male MRN [id number]  Date of birth: [birth year]  SUBJECTIVE:  Including CC & ROS.  Chief Complaint  Patient presents with  . Follow-up    CAYLE THUNDER is a 47 y.o. male that is here today for right shoulder pain follow up. He received an injection 09/27/17 with improvement to his pain. He was helping his daughter move over the weekend, noticed some pain afterwards. Admits to limited range of motion and pain during abduction.  The pain is moderate to severe.  The pain is localized to his shoulder.  No improvement with no modalities.  The pain appears to be the similar previous pain.  Independent review of the right shoulder x-ray from 2012 shows normal joint space.   Review of Systems  Constitutional: Negative for fever.  HENT: Negative for congestion.   Respiratory: Negative for cough.   Cardiovascular: Negative for chest pain.  Gastrointestinal: Negative for abdominal pain.  Musculoskeletal: Positive for arthralgias, back pain and joint swelling.  Skin: Negative for color change.  Neurological: Negative for weakness.  Hematological: Negative for adenopathy.  Psychiatric/Behavioral: Negative for agitation.    HISTORY: Past Medical, Surgical, Social, and Family History Reviewed & Updated per EMR.   Pertinent Historical Findings include:  Past Medical History:  Diagnosis Date  . Anemia    since gastric bypass  . Anxiety   . Aortic valve sclerosis   . Arthritis   . Arthropathy, unspecified, other specified sites   . CAD (coronary artery disease)   . Contact with or exposure to venereal diseases   . Depression   . Displacement of intervertebral disc, site unspecified, without myelopathy   . GERD (gastroesophageal reflux disease)   . Heart murmur    " slight, [place] said nothing to worry about."  . History of kidney stones   . Hypertension     " no longer have hypertension since gastric bypass "  . Lumbago   . Obesity, unspecified   . Other,  mixed, or unspecified nondependent drug abuse, unspecified   . Personal history of urinary calculi   . Pneumonia   . Priapism   . Stenosis, spinal, lumbar     Past Surgical History:  Procedure Laterality Date  . ABDOMINAL SURGERY    . ANKLE SURGERY    . ANTERIOR LAT LUMBAR FUSION Left 02/24/2016   Procedure: LEFT LUMBAR TWO-THREE  ANTERIOR LATERAL LUMBAR FUSION  ;  Surgeon: Earnie Larsson, MD;  Location: [facility];  Service: Neurosurgery;  Laterality: Left;  . BACK SURGERY    . GASTRIC BYPASS    . HERNIA REPAIR    . INCISION AND DRAINAGE ABSCESS Right 07/06/2004   great toe  . IRRIGATION AND DEBRIDEMENT ABSCESS Right 07/08/2004   great toe  . KNEE ARTHROPLASTY Right 04/26/2017   Procedure: RIGHT TOTAL KNEE ARTHROPLASTY WITH COMPUTER NAVIGATION;  Surgeon: Rod Can, MD;  Location: [facility];  Service: Orthopedics;  Laterality: Right;  Needs RNFA  . KNEE SURGERY    . LUMBAR PERCUTANEOUS PEDICLE SCREW 1 LEVEL Left 02/24/2016   Procedure: LUMBAR PERCUTANEOUS PEDICLE SCREW 1 LEVEL;  Surgeon: Earnie Larsson, MD;  Location: [facility];  Service: Neurosurgery;  Laterality: Left;  Marland Kitchen MICRODISCECTOMY LUMBAR Left 08/24/2008   T12 - L1  . panectomy      Allergies  Allergen Reactions  . Codeine Hives    Hives and itching    Family History  Problem Relation Age of Onset  . Arthritis Mother   .  Heart disease Father   . Hypertension Father   . Kidney disease Father      Social History   Socioeconomic History  . Marital status: Married    Spouse name: Not on file  . Number of children: Not on file  . Years of education: Not on file  . Highest education level: Not on file  Occupational History  . Not on file  Social Needs  . Financial resource strain: Not on file  . Food insecurity:    Worry: Not on file    Inability: Not on file  . Transportation needs:    Medical: Not on file    Non-medical: Not on file  Tobacco Use  . Smoking status: Never Smoker  . Smokeless tobacco: Never Used    Substance and Sexual Activity  . Alcohol use: No  . Drug use: No  . Sexual activity: Yes  Lifestyle  . Physical activity:    Days per week: Not on file    Minutes per session: Not on file  . Stress: Not on file  Relationships  . Social connections:    Talks on phone: Not on file    Gets together: Not on file    Attends religious service: Not on file    Active member of club or organization: Not on file    Attends meetings of clubs or organizations: Not on file    Relationship status: Not on file  . Intimate partner violence:    Fear of current or ex partner: Not on file    Emotionally abused: Not on file    Physically abused: Not on file    Forced sexual activity: Not on file  Other Topics Concern  . Not on file  Social History Narrative  . Not on file     PHYSICAL EXAM:  VS: BP 134/76   Pulse 66   Ht 5' 10"  (1.778 m)   Wt 229 lb (103.9 kg)   SpO2 100%   BMI 32.86 kg/m  Physical Exam Gen: NAD, alert, cooperative with exam, well-appearing ENT: normal lips, normal nasal mucosa,  Eye: normal EOM, normal conjunctiva and lids CV:  no edema, +2 pedal pulses   Resp: no accessory muscle use, non-labored,  Skin: no rashes, no areas of induration  Neuro: normal tone, normal sensation to touch Psych:  normal insight, alert and oriented MSK:  Right shoulder: Normal to inspection.  No tenderness palpation over the [facility] joint. Limited active flexion and abduction. Pain with empty can  Neurovascularly intact    Aspiration/Injection Procedure Note YAHMIR SOKOLOV 1970-12-12  Procedure: Injection Indications: left shoulder pain   Procedure Details Consent: Risks of procedure as well as the alternatives and risks of each were explained to the (patient/caregiver).  Consent for procedure obtained. Time Out: Verified patient identification, verified procedure, site/side was marked, verified correct patient position, special equipment/implants available,  medications/allergies/relevent history reviewed, required imaging and test results available.  Performed.  The area was cleaned with iodine and alcohol swabs.    The left GH joint was injected using 1 cc's of 40 mg Depomedrol and 4 cc's of 1% lidocaine with a 25 1 1/2" needle.  Ultrasound was used. Images were obtained in Transverse views showing the injection.    A sterile dressing was applied.  Patient did tolerate procedure well.       ASSESSMENT & PLAN:   Right shoulder pain Appears to have a mild effusion of the [place] joint. Also has rotator cuff  arthropathy  - GH injection today  - counseled on supportive care  - counseled on HEP  - advised that he may need to have the discussion of replacement if needing injections sooner than 3 months apart.

## 2017-11-15 NOTE — Assessment & Plan Note (Signed)
Appears to have a mild effusion of the [facility] joint. Also has rotator cuff arthropathy  - GH injection today  - counseled on supportive care  - counseled on HEP  - advised that he may need to have the discussion of replacement if needing injections sooner than 3 months apart.

## 2017-11-30 ENCOUNTER — Other Ambulatory Visit: Payer: Self-pay | Admitting: Family Medicine

## 2017-11-30 DIAGNOSIS — M25511 Pain in right shoulder: Secondary | ICD-10-CM

## 2017-12-07 ENCOUNTER — Ambulatory Visit (INDEPENDENT_AMBULATORY_CARE_PROVIDER_SITE_OTHER): Payer: BLUE CROSS/BLUE SHIELD

## 2017-12-07 DIAGNOSIS — Z23 Encounter for immunization: Secondary | ICD-10-CM | POA: Diagnosis not present

## 2017-12-19 DIAGNOSIS — N4832 Priapism due to disease classified elsewhere: Secondary | ICD-10-CM | POA: Diagnosis not present

## 2018-01-02 DIAGNOSIS — M75121 Complete rotator cuff tear or rupture of right shoulder, not specified as traumatic: Secondary | ICD-10-CM | POA: Diagnosis not present

## 2018-01-09 DIAGNOSIS — M961 Postlaminectomy syndrome, not elsewhere classified: Secondary | ICD-10-CM | POA: Diagnosis not present

## 2018-01-09 DIAGNOSIS — G894 Chronic pain syndrome: Secondary | ICD-10-CM | POA: Diagnosis not present

## 2018-01-09 DIAGNOSIS — Z79899 Other long term (current) drug therapy: Secondary | ICD-10-CM | POA: Diagnosis not present

## 2018-01-09 DIAGNOSIS — M545 Low back pain: Secondary | ICD-10-CM | POA: Diagnosis not present

## 2018-01-09 DIAGNOSIS — M4310 Spondylolisthesis, site unspecified: Secondary | ICD-10-CM | POA: Diagnosis not present

## 2018-01-18 DIAGNOSIS — M13841 Other specified arthritis, right hand: Secondary | ICD-10-CM | POA: Diagnosis not present

## 2018-01-18 DIAGNOSIS — M13842 Other specified arthritis, left hand: Secondary | ICD-10-CM | POA: Diagnosis not present

## 2018-01-18 DIAGNOSIS — M25511 Pain in right shoulder: Secondary | ICD-10-CM | POA: Diagnosis not present

## 2018-01-23 DIAGNOSIS — M75121 Complete rotator cuff tear or rupture of right shoulder, not specified as traumatic: Secondary | ICD-10-CM | POA: Diagnosis not present

## 2018-01-28 ENCOUNTER — Ambulatory Visit (INDEPENDENT_AMBULATORY_CARE_PROVIDER_SITE_OTHER): Payer: BLUE CROSS/BLUE SHIELD | Admitting: Internal Medicine

## 2018-01-28 ENCOUNTER — Encounter: Payer: Self-pay | Admitting: Internal Medicine

## 2018-01-28 ENCOUNTER — Other Ambulatory Visit (INDEPENDENT_AMBULATORY_CARE_PROVIDER_SITE_OTHER): Payer: BLUE CROSS/BLUE SHIELD

## 2018-01-28 VITALS — BP 140/80 | HR 72 | Temp 98.3°F | Resp 16 | Ht 70.0 in | Wt 211.8 lb

## 2018-01-28 DIAGNOSIS — J189 Pneumonia, unspecified organism: Secondary | ICD-10-CM

## 2018-01-28 DIAGNOSIS — I10 Essential (primary) hypertension: Secondary | ICD-10-CM

## 2018-01-28 DIAGNOSIS — R6882 Decreased libido: Secondary | ICD-10-CM

## 2018-01-28 DIAGNOSIS — D510 Vitamin B12 deficiency anemia due to intrinsic factor deficiency: Secondary | ICD-10-CM | POA: Diagnosis not present

## 2018-01-28 DIAGNOSIS — R05 Cough: Secondary | ICD-10-CM | POA: Diagnosis not present

## 2018-01-28 DIAGNOSIS — R059 Cough, unspecified: Secondary | ICD-10-CM

## 2018-01-28 LAB — CBC WITH DIFFERENTIAL/PLATELET
Basophils Absolute: 0 10*3/uL (ref 0.0–0.1)
Basophils Relative: 0.3 % (ref 0.0–3.0)
Eosinophils Absolute: 0.2 10*3/uL (ref 0.0–0.7)
Eosinophils Relative: 1.7 % (ref 0.0–5.0)
HCT: 30.4 % — ABNORMAL LOW (ref 39.0–52.0)
Hemoglobin: 9.1 g/dL — ABNORMAL LOW (ref 13.0–17.0)
Lymphocytes Relative: 32.3 % (ref 12.0–46.0)
Lymphs Abs: 3.1 10*3/uL (ref 0.7–4.0)
MCHC: 30 g/dL (ref 30.0–36.0)
MCV: 62.4 fl — ABNORMAL LOW (ref 78.0–100.0)
Monocytes Absolute: 0.9 10*3/uL (ref 0.1–1.0)
Monocytes Relative: 8.8 % (ref 3.0–12.0)
Neutro Abs: 5.5 10*3/uL (ref 1.4–7.7)
Neutrophils Relative %: 56.9 % (ref 43.0–77.0)
Platelets: 288 10*3/uL (ref 150.0–400.0)
RBC: 4.87 Mil/uL (ref 4.22–5.81)
RDW: 18.1 % — ABNORMAL HIGH (ref 11.5–15.5)
WBC: 9.7 10*3/uL (ref 4.0–10.5)

## 2018-01-28 LAB — BASIC METABOLIC PANEL
BUN: 7 mg/dL (ref 6–23)
CO2: 25 mEq/L (ref 19–32)
Calcium: 8.7 mg/dL (ref 8.4–10.5)
Chloride: 102 mEq/L (ref 96–112)
Creatinine, Ser: 0.84 mg/dL (ref 0.40–1.50)
GFR: 103.08 mL/min (ref >60.00)
Glucose, Bld: 95 mg/dL (ref 70–99)
Potassium: 4.1 mEq/L (ref 3.5–5.1)
Sodium: 134 mEq/L — ABNORMAL LOW (ref 135–145)

## 2018-01-28 LAB — POCT EXHALED NITRIC OXIDE: FeNO level (ppb): 13

## 2018-01-28 MED ORDER — PROMETHAZINE-DM 6.25-15 MG/5ML PO SYRP: 5.0000 mL | ORAL_SOLUTION | Freq: Four times a day (QID) | ORAL | 0 refills | Status: AC | PRN

## 2018-01-28 MED ORDER — AMOXICILLIN-POT CLAVULANATE 875-125 MG PO TABS: 1.0000 | ORAL_TABLET | Freq: Two times a day (BID) | ORAL | 0 refills | Status: AC

## 2018-01-28 NOTE — Patient Instructions (Signed)
Cough, Adult  Coughing is a reflex that clears your throat and your airways. Coughing helps to heal and protect your lungs. It is normal to cough occasionally, but a cough that happens with other symptoms or lasts a long time may be a sign of a condition that needs treatment. A cough may last only 2-3 weeks (acute), or it may last longer than 8 weeks (chronic).  What are the causes?  Coughing is commonly caused by:   Breathing in substances that irritate your lungs.   A viral or bacterial respiratory infection.   Allergies.   Asthma.   Postnasal drip.   Smoking.   Acid backing up from the stomach into the esophagus (gastroesophageal reflux).   Certain medicines.   Chronic lung problems, including COPD (or rarely, lung cancer).   Other medical conditions such as heart failure.    Follow these instructions at home:  Pay attention to any changes in your symptoms. Take these actions to help with your discomfort:   Take medicines only as told by your health care provider.  ? If you were prescribed an antibiotic medicine, take it as told by your health care provider. Do not stop taking the antibiotic even if you start to feel better.  ? Talk with your health care provider before you take a cough suppressant medicine.   Drink enough fluid to keep your urine clear or pale yellow.   If the air is dry, use a cold steam vaporizer or humidifier in your bedroom or your home to help loosen secretions.   Avoid anything that causes you to cough at work or at home.   If your cough is worse at night, try sleeping in a semi-upright position.   Avoid cigarette smoke. If you smoke, quit smoking. If you need help quitting, ask your health care provider.   Avoid caffeine.   Avoid alcohol.   Rest as needed.    Contact a health care provider if:   You have new symptoms.   You cough up pus.   Your cough does not get better after 2-3 weeks, or your cough gets worse.   You cannot control your cough with suppressant  medicines and you are losing sleep.   You develop pain that is getting worse or pain that is not controlled with pain medicines.   You have a fever.   You have unexplained weight loss.   You have night sweats.  Get help right away if:   You cough up blood.   You have difficulty breathing.   Your heartbeat is very fast.  This information is not intended to replace advice given to you by your health care provider. Make sure you discuss any questions you have with your health care provider.  Document Released: 08/19/2010 Document Revised: 07/29/2015 Document Reviewed: 04/29/2014  [employer] Interactive Patient Education  2018 [employer]

## 2018-01-28 NOTE — Progress Notes (Signed)
Subjective:  Patient ID: Bradley [name], male    DOB: 07-07-1970  Age: 47 y.o. MRN: [id number]  CC: Hypertension and Cough   HPI Bradley [name] presents for a 3-day history of cough that is productive of thick yellow phlegm with shortness of breath and night sweats.  Outpatient Medications Prior to Visit  Medication Sig Dispense Refill  . Azilsartan-Chlorthalidone (EDARBYCLOR) 40-25 MG TABS Take 1 tablet by mouth daily. 90 tablet 1  . Cholecalciferol 2000 units TABS Take 2 tablets (4,000 Units total) by mouth daily. 180 tablet 1  . ferrous sulfate 325 (65 FE) MG tablet Take 1 tablet (325 mg total) by mouth 3 (three) times daily with meals. 270 tablet 1  . Multiple Vitamin (MULTIVITAMIN) tablet Take 1 tablet by mouth daily.    . Oxycodone HCl 10 MG TABS Take 1 tablet by mouth 5 (five) times daily.  0  . senna (SENOKOT) 8.6 MG TABS tablet Take 2 tablets (17.2 mg total) by mouth at bedtime. 120 each 0  . traZODone (DESYREL) 100 MG tablet Take 1 tablet (100 mg total) by mouth at bedtime as needed for sleep. 90 tablet 1  . zinc gluconate 50 MG tablet Take 1 tablet (50 mg total) by mouth daily. 90 tablet 1   No facility-administered medications prior to visit.     ROS Review of Systems  Constitutional: Negative for chills, diaphoresis, fatigue and fever.  HENT: Negative.   Eyes: Negative for visual disturbance.  Respiratory: Positive for cough and shortness of breath. Negative for wheezing.   Cardiovascular: Negative for chest pain and leg swelling.  Gastrointestinal: Negative.  Negative for abdominal pain, constipation, diarrhea, nausea and vomiting.  Endocrine: Negative.   Genitourinary: Negative.  Negative for decreased urine volume, difficulty urinating, dysuria, flank pain and urgency.       He complains of low libido and erectile dysfunction.  He has a history of priapism.  Musculoskeletal: Negative.  Negative for arthralgias and myalgias.  Skin: Negative.  Negative for rash.    Neurological: Negative.  Negative for dizziness, weakness and headaches.  Hematological: Negative for adenopathy. Does not bruise/bleed easily.  Psychiatric/Behavioral: Negative.     Objective:  BP 140/80 (BP Location: Left Arm, Patient Position: Sitting, Cuff Size: Large)   Pulse 72   Temp 98.3 F (36.8 C) (Oral)   Resp 16   Ht 5' 10"  (1.778 m)   Wt 211 lb 12 oz (96 kg)   SpO2 97%   BMI 30.38 kg/m   BP Readings from Last 3 Encounters:  01/28/18 140/80  11/13/17 134/76  10/25/17 124/76    Wt Readings from Last 3 Encounters:  01/28/18 211 lb 12 oz (96 kg)  11/13/17 229 lb (103.9 kg)  10/25/17 224 lb 12 oz (101.9 kg)    Physical Exam  Constitutional: He is oriented to person, place, and time. Vital signs are normal.  Non-toxic appearance. He does not have a sickly appearance. He does not appear ill. No distress.  HENT:  Mouth/Throat: Oropharynx is clear and moist. No oropharyngeal exudate.  Eyes: Conjunctivae are normal. No scleral icterus.  Neck: Normal range of motion. Neck supple. No JVD present. No thyromegaly present.  Cardiovascular: Normal rate, regular rhythm and normal heart sounds. Exam reveals no gallop.  No murmur heard. Pulmonary/Chest: Effort normal. No stridor. No respiratory distress. He has no decreased breath sounds. He has no wheezes. He has rhonchi in the left lower field. He has rales in the left lower field.  Abdominal: Soft. Bowel sounds are normal. He exhibits no mass. There is no tenderness.  Musculoskeletal: Normal range of motion. He exhibits no edema, tenderness or deformity.  Lymphadenopathy:    He has no cervical adenopathy.  Neurological: He is alert and oriented to person, place, and time.  Skin: Skin is warm and dry. No rash noted. He is not diaphoretic.  Vitals reviewed.   Lab Results  Component Value Date   WBC 9.7 01/28/2018   HGB 9.1 (L) 01/28/2018   HCT 30.4 (L) 01/28/2018   PLT 288.0 01/28/2018   GLUCOSE 95 01/28/2018   CHOL  118 10/25/2017   TRIG 52.0 10/25/2017   HDL 47.90 10/25/2017   LDLCALC 60 10/25/2017   ALT 16 09/05/2016   AST 15 09/05/2016   NA 134 (L) 01/28/2018   K 4.1 01/28/2018   CL 102 01/28/2018   CREATININE 0.84 01/28/2018   BUN 7 01/28/2018   CO2 25 01/28/2018   TSH 1.92 04/24/2017   HGBA1C 5.7 10/25/2017    US Scrotum W/doppler  Result Date: 10/24/2017 CLINICAL DATA:  Scrotal mass and pain EXAM: SCROTAL ULTRASOUND DOPPLER ULTRASOUND OF THE TESTICLES TECHNIQUE: Complete ultrasound examination of the testicles, epididymis, and other scrotal structures was performed. Color and spectral Doppler ultrasound were also utilized to evaluate blood flow to the testicles. COMPARISON:  None. FINDINGS: Right testicle Measurements: 4.5 x 2.4 x 3.0 cm. No mass or microlithiasis visualized. Left testicle Measurements: 4.6 x 2.7 x 2.7 cm. No mass or microlithiasis visualized. Right epididymis:  Normal in size and appearance. Left epididymis:  Normal in size and appearance. Hydrocele:  Small left hydrocele Varicocele:  None visualized. Pulsed Doppler interrogation of both testes demonstrates normal low resistance arterial and venous waveforms bilaterally. IMPRESSION: No acute abnormality.  Small left hydrocele. Electronically Signed   By: Franchot Gallo M.D.   On: 10/24/2017 15:32    Assessment & Plan:   Bradley [name] was seen today for hypertension and cough.  Diagnoses and all orders for this visit:  Cough- He has a low FeNO score so I do not think he would benefit from systemic steroids. -     POCT EXHALED NITRIC OXIDE -     promethazine-dextromethorphan (PROMETHAZINE-DM) 6.25-15 MG/5ML syrup; Take 5 mLs by mouth 4 (four) times daily as needed for cough.  Pneumonia of left lung due to infectious organism, unspecified part of lung- I will treat the infection with Augmentin and will did control the cough with Phenergan DM as needed. -     amoxicillin-clavulanate (AUGMENTIN) 875-125 MG tablet; Take 1 tablet by  mouth 2 (two) times daily for 10 days. -     promethazine-dextromethorphan (PROMETHAZINE-DM) 6.25-15 MG/5ML syrup; Take 5 mLs by mouth 4 (four) times daily as needed for cough.  Essential hypertension-his blood pressure is adequately well controlled.  Electrolytes and renal function are normal. -     Basic metabolic panel; Future  Vitamin B12 deficiency anemia due to intrinsic factor deficiency- He remains anemic.  I have asked him to be compliant with the vitamin replacement therapy. -     CBC with Differential/Platelet; Future  Low libido- I will monitor his testosterone level.  I will also screen him for hyperprolactinemia. -     Prolactin; Future -     Testosterone Total,Free,Bio, Males; Future   I am having Bradley [name]. Bradley [name] start on amoxicillin-clavulanate and promethazine-dextromethorphan. I am also having him maintain his multivitamin, senna, Azilsartan-Chlorthalidone, Oxycodone HCl, Cholecalciferol, ferrous sulfate, traZODone, and zinc gluconate.  Meds  ordered this encounter  Medications  . amoxicillin-clavulanate (AUGMENTIN) 875-125 MG tablet    Sig: Take 1 tablet by mouth 2 (two) times daily for 10 days.    Dispense:  20 tablet    Refill:  0  . promethazine-dextromethorphan (PROMETHAZINE-DM) 6.25-15 MG/5ML syrup    Sig: Take 5 mLs by mouth 4 (four) times daily as needed for cough.    Dispense:  118 mL    Refill:  0     Follow-up: Return in about 3 weeks (around 02/18/2018).  [name], MD

## 2018-01-29 ENCOUNTER — Encounter: Payer: Self-pay | Admitting: Internal Medicine

## 2018-01-29 ENCOUNTER — Other Ambulatory Visit: Payer: Self-pay | Admitting: Internal Medicine

## 2018-01-29 DIAGNOSIS — R6882 Decreased libido: Secondary | ICD-10-CM

## 2018-01-29 LAB — PROLACTIN: Prolactin: 8.3 ng/mL (ref 2.0–18.0)

## 2018-01-29 LAB — TESTOSTERONE TOTAL,FREE,BIO, MALES
Albumin: 3.8 g/dL (ref 3.6–5.1)
Sex Hormone Binding: 94 nmol/L — ABNORMAL HIGH (ref 10–50)
Testosterone, Bioavailable: 68.5 ng/dL — ABNORMAL LOW (ref >=110.0)
Testosterone, Free: 39.1 pg/mL — ABNORMAL LOW (ref 46.0–224.0)
Testosterone: 716 ng/dL (ref 250–827)

## 2018-01-30 DIAGNOSIS — M5126 Other intervertebral disc displacement, lumbar region: Secondary | ICD-10-CM | POA: Diagnosis not present

## 2018-01-30 DIAGNOSIS — R030 Elevated blood-pressure reading, without diagnosis of hypertension: Secondary | ICD-10-CM | POA: Diagnosis not present

## 2018-01-30 DIAGNOSIS — M5416 Radiculopathy, lumbar region: Secondary | ICD-10-CM | POA: Diagnosis not present

## 2018-01-30 DIAGNOSIS — Z6829 Body mass index (BMI) 29.0-29.9, adult: Secondary | ICD-10-CM | POA: Diagnosis not present

## 2018-02-05 NOTE — Telephone Encounter (Signed)
Pt would like a referral sent to [facility] Philemon Kingdom, MD)

## 2018-02-06 ENCOUNTER — Telehealth: Payer: Self-pay | Admitting: Endocrinology

## 2018-02-06 NOTE — Telephone Encounter (Signed)
error 

## 2018-02-18 ENCOUNTER — Ambulatory Visit: Payer: BLUE CROSS/BLUE SHIELD | Admitting: Internal Medicine

## 2018-02-18 NOTE — Progress Notes (Deleted)
Name: Bradley [name]  MRN/ DOB: [id number], 01-Sep-1970    Age/ Sex: 47 y.o., male    PCP: [name], MD   Reason for Endocrinology Evaluation: Low freeTestosterone      Date of Initial Endocrinology Evaluation: 02/18/2018     HPI: [name] is a 47 y.o. male with a past medical history of HTN, DJD, Hx of gastric bypass and hx of narcotic use. The patient presented for initial endocrinology clinic visit on 02/18/2018 for consultative assistance with his low free tesotsterone.     HISTORY:  Past Medical History:  Past Medical History:  Diagnosis Date  . Anemia    since gastric bypass  . Anxiety   . Aortic valve sclerosis   . Arthritis   . Arthropathy, unspecified, other specified sites   . CAD (coronary artery disease)   . Contact with or exposure to venereal diseases   . Depression   . Displacement of intervertebral disc, site unspecified, without myelopathy   . GERD (gastroesophageal reflux disease)   . Heart murmur    " slight, [place] said nothing to worry about."  . History of kidney stones   . Hypertension     " no longer have hypertension since gastric bypass "  . Lumbago   . Obesity, unspecified   . Other, mixed, or unspecified nondependent drug abuse, unspecified   . Personal history of urinary calculi   . Pneumonia   . Priapism   . Stenosis, spinal, lumbar     Past Surgical History:  Past Surgical History:  Procedure Laterality Date  . ABDOMINAL SURGERY    . ANKLE SURGERY    . ANTERIOR LAT LUMBAR FUSION Left 02/24/2016   Procedure: LEFT LUMBAR TWO-THREE  ANTERIOR LATERAL LUMBAR FUSION  ;  Surgeon: Earnie Larsson, MD;  Location: [facility];  Service: Neurosurgery;  Laterality: Left;  . BACK SURGERY    . GASTRIC BYPASS    . HERNIA REPAIR    . INCISION AND DRAINAGE ABSCESS Right 07/06/2004   great toe  . IRRIGATION AND DEBRIDEMENT ABSCESS Right 07/08/2004   great toe  . KNEE ARTHROPLASTY Right 04/26/2017   Procedure: RIGHT TOTAL KNEE ARTHROPLASTY  WITH COMPUTER NAVIGATION;  Surgeon: Rod Can, MD;  Location: [facility];  Service: Orthopedics;  Laterality: Right;  Needs RNFA  . KNEE SURGERY    . LUMBAR PERCUTANEOUS PEDICLE SCREW 1 LEVEL Left 02/24/2016   Procedure: LUMBAR PERCUTANEOUS PEDICLE SCREW 1 LEVEL;  Surgeon: Earnie Larsson, MD;  Location: [facility];  Service: Neurosurgery;  Laterality: Left;  Marland Kitchen MICRODISCECTOMY LUMBAR Left 08/24/2008   T12 - L1  . panectomy        Social History:  reports that he has never smoked. He has never used smokeless tobacco. He reports that he does not drink alcohol or use drugs.  Family History: family history includes Arthritis in his mother; Heart disease in his father; Hypertension in his father; Kidney disease in his father.   HOME MEDICATIONS: Current Outpatient Medications on File Prior to Visit  Medication Sig Dispense Refill  . Azilsartan-Chlorthalidone (EDARBYCLOR) 40-25 MG TABS Take 1 tablet by mouth daily. 90 tablet 1  . Cholecalciferol 2000 units TABS Take 2 tablets (4,000 Units total) by mouth daily. 180 tablet 1  . ferrous sulfate 325 (65 FE) MG tablet Take 1 tablet (325 mg total) by mouth 3 (three) times daily with meals. 270 tablet 1  . Multiple Vitamin (MULTIVITAMIN) tablet Take 1 tablet by mouth daily.    Marland Kitchen  Oxycodone HCl 10 MG TABS Take 1 tablet by mouth 5 (five) times daily.  0  . promethazine-dextromethorphan (PROMETHAZINE-DM) 6.25-15 MG/5ML syrup Take 5 mLs by mouth 4 (four) times daily as needed for cough. 118 mL 0  . senna (SENOKOT) 8.6 MG TABS tablet Take 2 tablets (17.2 mg total) by mouth at bedtime. 120 each 0  . traZODone (DESYREL) 100 MG tablet Take 1 tablet (100 mg total) by mouth at bedtime as needed for sleep. 90 tablet 1  . zinc gluconate 50 MG tablet Take 1 tablet (50 mg total) by mouth daily. 90 tablet 1   No current facility-administered medications on file prior to visit.       REVIEW OF SYSTEMS: A comprehensive ROS was conducted with the patient and is negative  except as per HPI and below:  ROS     OBJECTIVE:  VS: There were no vitals taken for this visit.   Wt Readings from Last 3 Encounters:  01/28/18 211 lb 12 oz (96 kg)  11/13/17 229 lb (103.9 kg)  10/25/17 224 lb 12 oz (101.9 kg)     EXAM: General: Pt appears well and is in NAD  Hydration: Well-hydrated with moist mucous membranes and good skin turgor  Eyes: External eye exam normal without stare, lid lag or exophthalmos.  EOM intact.   Ears, Nose, Throat: Hearing: Grossly intact bilaterally Dental: Good dentition  Throat: Clear without mass, erythema or exudate  Neck: General: Supple without adenopathy. Thyroid: Thyroid size normal.  No goiter or nodules appreciated. No thyroid bruit.  Lungs: Clear with good BS bilat with no rales, rhonchi, or wheezes  Heart: Auscultation: RRR.  Abdomen: Normoactive bowel sounds, soft, nontender, without masses or organomegaly palpable  Extremities: Gait and station: Normal gait  Digits and nails: No clubbing, cyanosis, petechiae, or nodes Head and neck: Normal alignment and mobility BL UE: Normal ROM and strength. BL LE: No pretibial edema normal ROM and strength.  Skin: Hair: Texture and amount normal with gender appropriate distribution Skin Inspection: No rashes, acanthosis nigricans/skin tags.  Skin Palpation: Skin temperature, texture, and thickness normal to palpation  Neuro: Cranial nerves: II - XII grossly intact  Motor: Normal strength throughout DTRs: 2+ and symmetric in UE without delay in relaxation phase  Mental Status: Judgment, insight: Intact Orientation: Oriented to time, place, and person Mood and affect: No depression, anxiety, or agitation     DATA REVIEWED:   Results for FRUTOSO, DIMARE (MRN [id number]) as of 02/18/2018 07:42  Ref. Range 01/28/2018 14:49  Prolactin Latest Ref Range: 2.0 - 18.0 ng/mL 8.3  Glucose Latest Ref Range: 70 - 99 mg/dL 95  Sex Horm Binding Glob, Serum Latest Ref Range: 10 - 50 nmol/L 94  (H)  Testosterone Latest Ref Range: 250 - 827 ng/dL 716  Testosterone, Bioavailable Latest Ref Range: 110.0 - 575 ng/dL 68.5 (L)  Testosterone Free Latest Ref Range: 46.0 - 224.0 pg/mL 39.1 (L)  Results for MORIS, RATCHFORD (MRN [id number]) as of 02/18/2018 07:42  Ref. Range 04/24/2017 11:21  TSH Latest Ref Range: 0.40 - 4.50 mIU/L 1.92  Thyroxine (T4) Latest Ref Range: 4.9 - 10.5 mcg/dL 8.8  Free Thyroxine Index Latest Ref Range: 1.4 - 3.8  3.0  T3 Uptake Latest Ref Range: 22 - 35 % 34   Old records , labs and images have been reviewed.    ASSESSMENT/PLAN/RECOMMENDATIONS:   1. ***        Signed electronically by: Mack Guise, MD  [facility]  [address] [place], [place] [address] Phone: [phone] FAX: [phone]   CC: [name], MD [address] [place] [address] Phone: [phone] Fax: [phone]   Return to Endocrinology clinic as below: No future appointments.

## 2018-02-25 DIAGNOSIS — M13842 Other specified arthritis, left hand: Secondary | ICD-10-CM | POA: Diagnosis not present

## 2018-02-25 DIAGNOSIS — M13841 Other specified arthritis, right hand: Secondary | ICD-10-CM | POA: Diagnosis not present

## 2018-02-25 IMAGING — DX DG FINGER THUMB 2+V*L*
3 series · 3 of 3 positions shown · non-contrast
Comparison: None.

CLINICAL DATA: Lt thumb pain mainly in MCP joint x 1 mo, pt has
limited ROM, NKI

EXAM:
LEFT THUMB 2+V

[finger ap]
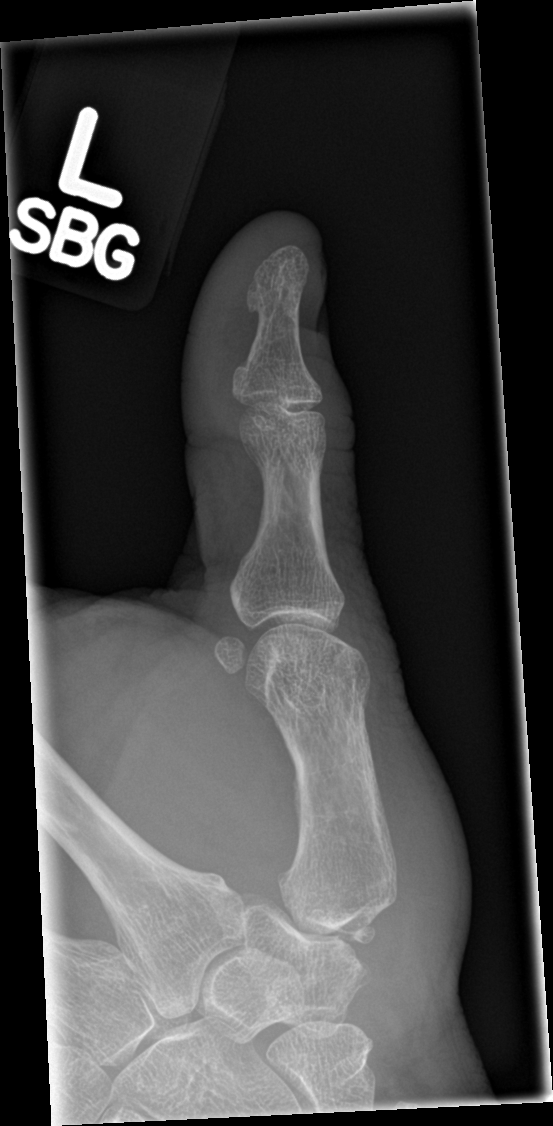

[finger obl]
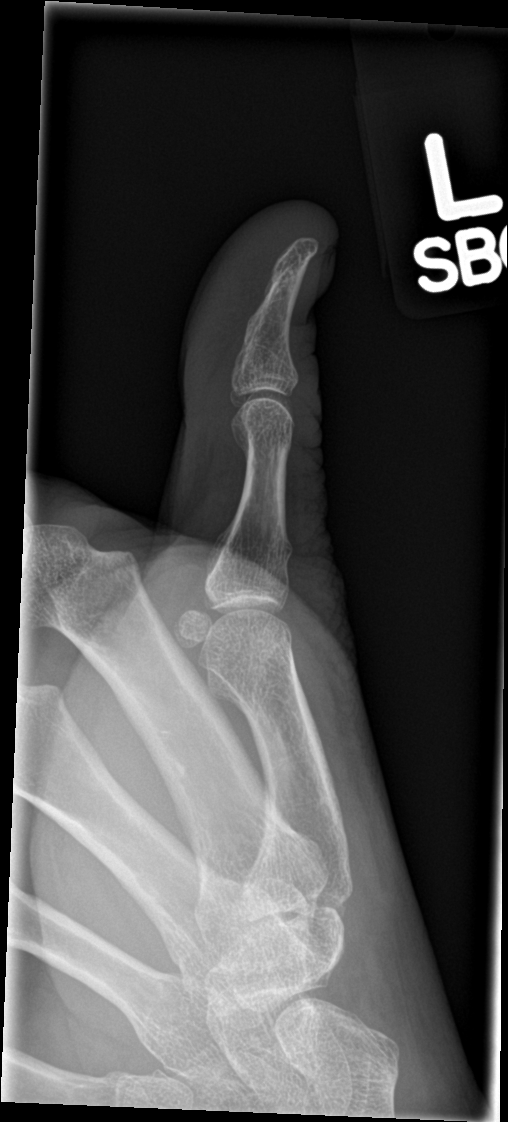

[finger lat]
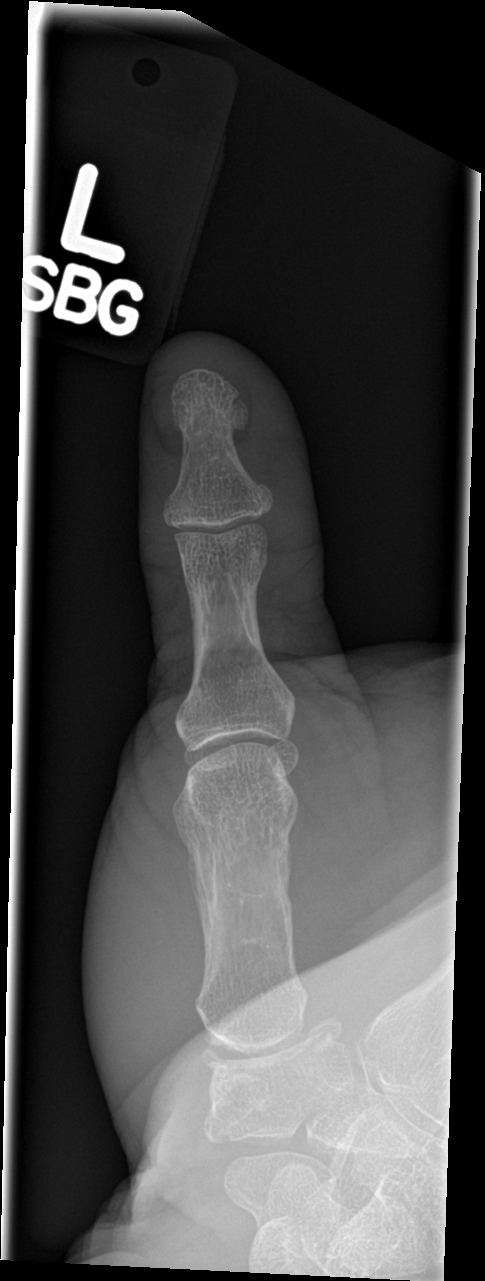

[3 of 3 positions shown; findings below may reference images not displayed]

FINDINGS: No fracture.  No dislocation.

There are osteoarthritic changes at the trapezium first metacarpal
articulation with mild asymmetric joint space narrowing and small
marginal osteophytes. The thumb MCP and IP joints are well
maintained.

Soft tissues are unremarkable.
IMPRESSION: 1. No fracture or acute finding.
2. Mild osteoarthritis at the first carpometacarpal articulation.

## 2018-02-28 NOTE — Progress Notes (Signed)
Name: Bradley [name]  MRN/ DOB: [id number], 21-Feb-1971    Age/ Sex: 47 y.o., male    PCP: [name], MD   Reason for Endocrinology Evaluation: Low freeTestosterone      Date of Initial Endocrinology Evaluation: 03/01/2018     HPI: Bradley [name] is a 47 y.o. male with a past medical history of HTN, DJD, Hx of gastric bypass (2008) and hx chronic pain syndrome with chronic use of narcotics. The patient presented for initial endocrinology clinic visit on 03/01/2018 for consultative assistance with his low free tesotsterone.    Pt is accompanied by his wife [name] In 2015-2016 pt was having recurrent episodes of priapism getting , every 2-3 days requiring multiple interventions in the ER. It was noted this was started after his tummy tuck At the time this started, he was on  Vicodin and an Anti-hypertensive meds as well as MVI at the time.   From there he was sent to [facility] urology, performed blood flow study, noted " back fill " from there he started having issues with erectile dysfunction, at the time viagra did not help. That's when he was started on Trimix injections.    His main issue now is maintaining erections, but no trouble with  initiating erections . He also has been having recurrent episodes of priapism for the past 2 months.    Pt presented to his PCP with above complaints which triggered a testosterone check , with results below.  Has an appointment at [facility] in February, 2020 for another evaluation of recurrent priapism.   He is currently on percocet , prior to that was on vicodin, has multiple aches and pains and multiple back sx in the past.   Denies decrease in shaving frequency Denies any changes in his testicular size No gynecomastia  Denies using OTC meds or herbal supplements/teas unless its vitamins for his gastric bypass.      HISTORY:  Past Medical History:  Past Medical History:  Diagnosis Date  . Anemia    since  gastric bypass  . Anxiety   . Aortic valve sclerosis   . Arthritis   . Arthropathy, unspecified, other specified sites   . CAD (coronary artery disease)   . Contact with or exposure to venereal diseases   . Depression   . Displacement of intervertebral disc, site unspecified, without myelopathy   . GERD (gastroesophageal reflux disease)   . Heart murmur    " slight, [place] said nothing to worry about."  . History of kidney stones   . Hypertension     " no longer have hypertension since gastric bypass "  . Lumbago   . Obesity, unspecified   . Other, mixed, or unspecified nondependent drug abuse, unspecified   . Personal history of urinary calculi   . Pneumonia   . Priapism   . Stenosis, spinal, lumbar    Past Surgical History:  Past Surgical History:  Procedure Laterality Date  . ABDOMINAL SURGERY    . ANKLE SURGERY    . ANTERIOR LAT LUMBAR FUSION Left 02/24/2016   Procedure: LEFT LUMBAR TWO-THREE  ANTERIOR LATERAL LUMBAR FUSION  ;  Surgeon: Earnie Larsson, MD;  Location: [facility];  Service: Neurosurgery;  Laterality: Left;  . BACK SURGERY    . GASTRIC BYPASS    . HERNIA REPAIR    . INCISION AND DRAINAGE ABSCESS Right 07/06/2004   great toe  . IRRIGATION AND DEBRIDEMENT ABSCESS Right 07/08/2004  great toe  . KNEE ARTHROPLASTY Right 04/26/2017   Procedure: RIGHT TOTAL KNEE ARTHROPLASTY WITH COMPUTER NAVIGATION;  Surgeon: Rod Can, MD;  Location: [facility];  Service: Orthopedics;  Laterality: Right;  Needs RNFA  . KNEE SURGERY    . LUMBAR PERCUTANEOUS PEDICLE SCREW 1 LEVEL Left 02/24/2016   Procedure: LUMBAR PERCUTANEOUS PEDICLE SCREW 1 LEVEL;  Surgeon: Earnie Larsson, MD;  Location: [facility];  Service: Neurosurgery;  Laterality: Left;  Marland Kitchen MICRODISCECTOMY LUMBAR Left 08/24/2008   T12 - L1  . panectomy        Social History:  reports that he has never smoked. He has never used smokeless tobacco. He reports that he does not drink alcohol or use drugs.  Family History: family history  includes Arthritis in his mother; Heart disease in his father; Hypertension in his father; Kidney disease in his father.   HOME MEDICATIONS: Current Outpatient Medications on File Prior to Visit  Medication Sig Dispense Refill  . Azilsartan-Chlorthalidone (EDARBYCLOR) 40-25 MG TABS Take 1 tablet by mouth daily. 90 tablet 1  . Cholecalciferol 2000 units TABS Take 2 tablets (4,000 Units total) by mouth daily. 180 tablet 1  . ferrous sulfate 325 (65 FE) MG tablet Take 1 tablet (325 mg total) by mouth 3 (three) times daily with meals. 270 tablet 1  . Multiple Vitamin (MULTIVITAMIN) tablet Take 1 tablet by mouth daily.    . Oxycodone HCl 10 MG TABS Take 1 tablet by mouth 5 (five) times daily.  0  . traZODone (DESYREL) 100 MG tablet Take 1 tablet (100 mg total) by mouth at bedtime as needed for sleep. 90 tablet 1  . zinc gluconate 50 MG tablet Take 1 tablet (50 mg total) by mouth daily. 90 tablet 1  . promethazine-dextromethorphan (PROMETHAZINE-DM) 6.25-15 MG/5ML syrup Take 5 mLs by mouth 4 (four) times daily as needed for cough. (Patient not taking: Reported on 03/01/2018) 118 mL 0  . senna (SENOKOT) 8.6 MG TABS tablet Take 2 tablets (17.2 mg total) by mouth at bedtime. (Patient not taking: Reported on 03/01/2018) 120 each 0   No current facility-administered medications on file prior to visit.       REVIEW OF SYSTEMS: A comprehensive ROS was conducted with the patient and is negative except as per HPI and below:  Review of Systems  Constitutional: Negative for fever and weight loss.  HENT: Negative for congestion and sore throat.   Eyes: Negative for blurred vision and pain.  Respiratory: Negative for cough and shortness of breath.   Cardiovascular: Negative for chest pain and palpitations.  Genitourinary: Negative for frequency.  Musculoskeletal: Positive for back pain and joint pain.  Skin: Negative.   Neurological: Negative for tingling and tremors.  Endo/Heme/Allergies: Negative for  polydipsia.  Psychiatric/Behavioral: Positive for depression. The patient is not nervous/anxious.        OBJECTIVE:  VS: BP 118/62 (BP Location: Left Arm, Patient Position: Sitting, Cuff Size: Normal)   Pulse 78   Ht 5' 8"  (1.727 m)   Wt 203 lb 12.8 oz (92.4 kg)   SpO2 99%   BMI 30.99 kg/m    Wt Readings from Last 3 Encounters:  03/01/18 203 lb 12.8 oz (92.4 kg)  01/28/18 211 lb 12 oz (96 kg)  11/13/17 229 lb (103.9 kg)     EXAM: General: Pt appears well and is in NAD  Hydration: Well-hydrated with moist mucous membranes and good skin turgor  Eyes: External eye exam normal without stare, lid lag or exophthalmos.  EOM intact.   Ears, Nose, Throat: Hearing: Grossly intact bilaterally Dental: Good dentition  Throat: Clear without mass, erythema or exudate  Neck: General: Supple without adenopathy. Thyroid: Thyroid size normal.  No goiter or nodules appreciated. No thyroid bruit.  Lungs: Clear with good BS bilat with no rales, rhonchi, or wheezes  Heart: Auscultation: RRR.  Abdomen: Normoactive bowel sounds, soft, nontender, without masses or organomegaly palpable Testicular size 25 mL bilaterally , no nodules noted   Extremities:  BL LE: No pretibial edema normal ROM and strength.  Skin: Hair: Texture and amount normal with gender appropriate distribution Skin Inspection: No rashes, acanthosis nigricans/skin tags.  Skin Palpation: Skin temperature, texture, and thickness normal to palpation  Neuro: Cranial nerves: II - XII grossly intact  Motor: Normal strength throughout DTRs: 2+ and symmetric in UE without delay in relaxation phase  Mental Status: Judgment, insight: Intact Orientation: Oriented to time, place, and person Mood and affect: No depression, anxiety, or agitation     DATA REVIEWED:   Results for Bradley, [name] (MRN [id number]) as of 02/18/2018 07:42  Ref. Range 01/28/2018 14:49  Prolactin Latest Ref Range: 2.0 - 18.0 ng/mL 8.3  Glucose Latest Ref  Range: 70 - 99 mg/dL 95  Sex Horm Binding Glob, Serum Latest Ref Range: 10 - 50 nmol/L 94 (H)  Testosterone Latest Ref Range: 250 - 827 ng/dL 716  Testosterone, Bioavailable Latest Ref Range: 110.0 - 575 ng/dL 68.5 (L)  Testosterone Free Latest Ref Range: 46.0 - 224.0 pg/mL 39.1 (L)  Results for Bradley, [name] (MRN [id number]) as of 02/18/2018 07:42  Ref. Range 04/24/2017 11:21  TSH Latest Ref Range: 0.40 - 4.50 mIU/L 1.92  Thyroxine (T4) Latest Ref Range: 4.9 - 10.5 mcg/dL 8.8  Free Thyroxine Index Latest Ref Range: 1.4 - 3.8  3.0  T3 Uptake Latest Ref Range: 22 - 35 % 34   Old records , labs and images have been reviewed.    ASSESSMENT/PLAN/RECOMMENDATIONS:   1. Low Free Testosterone in the setting of high Sex Hormone Binding Globulin(SHBG):   - Pt's free testosterone is low with high total testosterone, due to high Sex Hormone Binding Globulin.  - Pt's elevated SHBG needs to be further evaluated , this is usually caused by hepatic pathology I will defer further work up in this to his PCP or GI.  - In the meantime, I have discussed with him that being on long term narcotic use will suppress his  Hypothalamic-pituitary-  gonadal axis , it is not clear what his true endogenous testosterone productions with the current picture.  - I will repeat his total testosterone though liquid chromatography as well as free testosterone level through dialysis testing and proceed from there. I will also check prolactin for decreased libido. Will also obtain LH/FSH and estradiol.  - Clinically there's no evidence of long term hypogonadism on today's exam.     F/U in 4 months    Signed electronically by: Mack Guise, MD  [facility]  [facility] [address] [place], [place] [address] Phone: [phone] FAX: [phone]   CC: [name], MD [address] [place] [address] Phone: [phone] Fax:  [phone]   Return to Endocrinology clinic as below: Future Appointments  Date Time Provider [place]  03/18/2018  8:00 AM [facility] None  07/01/2018 10:10 AM , Melanie Crazier, MD [facility] None

## 2018-03-01 ENCOUNTER — Ambulatory Visit (INDEPENDENT_AMBULATORY_CARE_PROVIDER_SITE_OTHER): Payer: BLUE CROSS/BLUE SHIELD | Admitting: Internal Medicine

## 2018-03-01 ENCOUNTER — Encounter: Payer: Self-pay | Admitting: Internal Medicine

## 2018-03-01 VITALS — BP 118/62 | HR 78 | Ht 68.0 in | Wt 203.8 lb

## 2018-03-01 DIAGNOSIS — R7989 Other specified abnormal findings of blood chemistry: Secondary | ICD-10-CM | POA: Diagnosis not present

## 2018-03-01 NOTE — Patient Instructions (Signed)
-   Your Sex Hormone Binding globulin is elevated, this is why your free testosterone is low, because your body is not making it available to use.  - The priority at this time, is to figure out why you have high Sex hormone binding globulin, this is most likely caused by liver disease, I will defer this work up to your primary care physician.  - Please stop by at the lab sometime around 8 AM , so we can draw proper labs. Make sure you come fasting.

## 2018-03-13 ENCOUNTER — Other Ambulatory Visit: Payer: Self-pay | Admitting: Internal Medicine

## 2018-03-13 ENCOUNTER — Telehealth: Payer: Self-pay | Admitting: Emergency Medicine

## 2018-03-13 DIAGNOSIS — K7469 Other cirrhosis of liver: Secondary | ICD-10-CM | POA: Insufficient documentation

## 2018-03-13 DIAGNOSIS — K7581 Nonalcoholic steatohepatitis (NASH): Secondary | ICD-10-CM | POA: Insufficient documentation

## 2018-03-13 NOTE — Telephone Encounter (Signed)
Pt requesting status of GI referral per last Endo appt. Please enter referral for GI per Dr Kelton Pillar on 03/01/18 for hepatic pathology

## 2018-03-13 NOTE — Telephone Encounter (Signed)
Referral ordered

## 2018-03-13 NOTE — Telephone Encounter (Signed)
Patient aware.

## 2018-03-18 ENCOUNTER — Other Ambulatory Visit (INDEPENDENT_AMBULATORY_CARE_PROVIDER_SITE_OTHER): Payer: BLUE CROSS/BLUE SHIELD

## 2018-03-18 DIAGNOSIS — R7989 Other specified abnormal findings of blood chemistry: Secondary | ICD-10-CM

## 2018-03-18 LAB — FOLLICLE STIMULATING HORMONE: FSH: 8.8 m[IU]/mL (ref 1.4–18.1)

## 2018-03-18 LAB — LUTEINIZING HORMONE: LH: 5.08 m[IU]/mL (ref 1.50–9.30)

## 2018-03-20 LAB — ESTRADIOL: Estradiol: 35 pg/mL (ref <=39)

## 2018-03-20 LAB — PROLACTIN: Prolactin: 11.6 ng/mL (ref 2.0–18.0)

## 2018-03-20 LAB — TESTOSTERONE, TOTAL, LC/MS/MS: Testosterone, Total, LC-MS-MS: 497 ng/dL (ref 250–1100)

## 2018-03-20 LAB — SEX HORMONE BINDING GLOBULIN: Sex Hormone Binding: 97 nmol/L — ABNORMAL HIGH (ref 10–50)

## 2018-03-22 LAB — TESTOSTERONE FREE MS/DIALYSIS
Free Testosterone, Serum: 33 pg/mL — ABNORMAL LOW
Testosterone, Serum (Total): 556 ng/dL
Testosterone-% Free: 0.6 %

## 2018-03-25 ENCOUNTER — Encounter: Payer: Self-pay | Admitting: Gastroenterology

## 2018-03-26 DIAGNOSIS — M5416 Radiculopathy, lumbar region: Secondary | ICD-10-CM | POA: Diagnosis not present

## 2018-03-26 DIAGNOSIS — R030 Elevated blood-pressure reading, without diagnosis of hypertension: Secondary | ICD-10-CM | POA: Diagnosis not present

## 2018-03-26 DIAGNOSIS — M5126 Other intervertebral disc displacement, lumbar region: Secondary | ICD-10-CM | POA: Diagnosis not present

## 2018-03-26 DIAGNOSIS — Z6829 Body mass index (BMI) 29.0-29.9, adult: Secondary | ICD-10-CM | POA: Diagnosis not present

## 2018-03-29 ENCOUNTER — Telehealth: Payer: Self-pay

## 2018-03-29 NOTE — Telephone Encounter (Signed)
PA was denied this year. Do you want an appeal completed for this medication.

## 2018-03-30 ENCOUNTER — Other Ambulatory Visit: Payer: Self-pay | Admitting: Internal Medicine

## 2018-03-30 DIAGNOSIS — I10 Essential (primary) hypertension: Secondary | ICD-10-CM

## 2018-03-30 MED ORDER — CANDESARTAN CILEXETIL-HCTZ 32-12.5 MG PO TABS: 1.0000 | ORAL_TABLET | Freq: Every day | ORAL | 0 refills | Status: DC

## 2018-03-30 NOTE — Telephone Encounter (Signed)
New RX sent in.

## 2018-04-01 ENCOUNTER — Other Ambulatory Visit: Payer: BLUE CROSS/BLUE SHIELD

## 2018-04-01 NOTE — Telephone Encounter (Signed)
Pt informed of change in rx. Pt stated understanding and will monitor his blood pressure.

## 2018-04-08 DIAGNOSIS — N529 Male erectile dysfunction, unspecified: Secondary | ICD-10-CM | POA: Diagnosis not present

## 2018-04-08 DIAGNOSIS — N4830 Priapism, unspecified: Secondary | ICD-10-CM | POA: Diagnosis not present

## 2018-04-11 ENCOUNTER — Encounter: Payer: Self-pay | Admitting: Internal Medicine

## 2018-04-11 ENCOUNTER — Ambulatory Visit (INDEPENDENT_AMBULATORY_CARE_PROVIDER_SITE_OTHER)
Admission: RE | Admit: 2018-04-11 | Discharge: 2018-04-11 | Disposition: A | Discharge status: Home / Self Care | Payer: BLUE CROSS/BLUE SHIELD | Source: Ambulatory Visit | Attending: Internal Medicine | Admitting: Internal Medicine

## 2018-04-11 ENCOUNTER — Ambulatory Visit (INDEPENDENT_AMBULATORY_CARE_PROVIDER_SITE_OTHER): Payer: BLUE CROSS/BLUE SHIELD | Admitting: Internal Medicine

## 2018-04-11 ENCOUNTER — Other Ambulatory Visit (INDEPENDENT_AMBULATORY_CARE_PROVIDER_SITE_OTHER): Payer: BLUE CROSS/BLUE SHIELD

## 2018-04-11 VITALS — BP 132/82 | HR 75 | Temp 98.2°F | Resp 16 | Ht 68.0 in | Wt 199.8 lb

## 2018-04-11 DIAGNOSIS — Z9884 Bariatric surgery status: Secondary | ICD-10-CM | POA: Diagnosis not present

## 2018-04-11 DIAGNOSIS — E559 Vitamin D deficiency, unspecified: Secondary | ICD-10-CM

## 2018-04-11 DIAGNOSIS — D539 Nutritional anemia, unspecified: Secondary | ICD-10-CM

## 2018-04-11 DIAGNOSIS — R0789 Other chest pain: Secondary | ICD-10-CM

## 2018-04-11 DIAGNOSIS — I10 Essential (primary) hypertension: Secondary | ICD-10-CM | POA: Diagnosis not present

## 2018-04-11 DIAGNOSIS — E60 Dietary zinc deficiency: Secondary | ICD-10-CM | POA: Diagnosis not present

## 2018-04-11 DIAGNOSIS — D510 Vitamin B12 deficiency anemia due to intrinsic factor deficiency: Secondary | ICD-10-CM | POA: Diagnosis not present

## 2018-04-11 DIAGNOSIS — D508 Other iron deficiency anemias: Secondary | ICD-10-CM | POA: Diagnosis not present

## 2018-04-11 DIAGNOSIS — S299XXA Unspecified injury of thorax, initial encounter: Secondary | ICD-10-CM | POA: Diagnosis not present

## 2018-04-11 LAB — CBC WITH DIFFERENTIAL/PLATELET
Basophils Absolute: 0 10*3/uL (ref 0.0–0.1)
Basophils Relative: 0.5 % (ref 0.0–3.0)
Eosinophils Absolute: 0.1 10*3/uL (ref 0.0–0.7)
Eosinophils Relative: 1.3 % (ref 0.0–5.0)
HCT: 27.3 % — ABNORMAL LOW (ref 39.0–52.0)
Hemoglobin: 8.5 g/dL — ABNORMAL LOW (ref 13.0–17.0)
Lymphocytes Relative: 28.9 % (ref 12.0–46.0)
Lymphs Abs: 1.9 10*3/uL (ref 0.7–4.0)
MCHC: 31.3 g/dL (ref 30.0–36.0)
MCV: 62.3 fl — ABNORMAL LOW (ref 78.0–100.0)
Monocytes Absolute: 0.9 10*3/uL (ref 0.1–1.0)
Monocytes Relative: 12.9 % — ABNORMAL HIGH (ref 3.0–12.0)
Neutro Abs: 3.7 10*3/uL (ref 1.4–7.7)
Neutrophils Relative %: 56.4 % (ref 43.0–77.0)
Platelets: 312 10*3/uL (ref 150.0–400.0)
RBC: 4.38 Mil/uL (ref 4.22–5.81)
RDW: 20.3 % — ABNORMAL HIGH (ref 11.5–15.5)
WBC: 6.6 10*3/uL (ref 4.0–10.5)

## 2018-04-11 LAB — VITAMIN D 25 HYDROXY (VIT D DEFICIENCY, FRACTURES): VITD: 29.13 ng/mL — ABNORMAL LOW (ref 30.00–100.00)

## 2018-04-11 LAB — IBC PANEL
Iron: 11 ug/dL — ABNORMAL LOW (ref 42–165)
Saturation Ratios: 2.4 % — ABNORMAL LOW (ref 20.0–50.0)
Transferrin: 332 mg/dL (ref 212.0–360.0)

## 2018-04-11 LAB — FOLATE: Folate: 14.3 ng/mL (ref >5.9)

## 2018-04-11 LAB — FERRITIN: Ferritin: 3.5 ng/mL — ABNORMAL LOW (ref 22.0–322.0)

## 2018-04-11 LAB — VITAMIN B12: Vitamin B-12: 244 pg/mL (ref 211–911)

## 2018-04-11 MED ORDER — KETOROLAC TROMETHAMINE 60 MG/2ML IM SOLN
60.0000 mg | Freq: Once | INTRAMUSCULAR | Status: AC
  Administered 2018-04-11: 60 mg via INTRAMUSCULAR

## 2018-04-11 MED ORDER — IBUPROFEN 600 MG PO TABS: 600.0000 mg | ORAL_TABLET | Freq: Three times a day (TID) | ORAL | 0 refills | Status: DC | PRN

## 2018-04-11 MED ORDER — CHOLECALCIFEROL 50 MCG (2000 UT) PO TABS: 2.0000 | ORAL_TABLET | Freq: Every day | ORAL | 1 refills | Status: DC

## 2018-04-11 NOTE — Progress Notes (Signed)
Subjective:  Patient ID: Bradley [name], male    DOB: 1970/04/11  Age: 48 y.o. MRN: [id number]  CC: Anemia and Hypertension   HPI BRYKER FLETCHALL presents for concerns but a left anterior chest wall injury.  3 days ago he was multitasking while trying to get out of a hot tub and he lost his balance and fell and injured his left anterior rib cage.  He has soreness in the area but has not noticed any bruising or swelling.  He denies hemoptysis, flank pain, hematuria, shortness of breath, or wheezing.  He has tried to control the pain with oxycodone and Tylenol but has not gotten much symptom relief.  Outpatient Medications Prior to Visit  Medication Sig Dispense Refill  . candesartan-hydrochlorothiazide (ATACAND HCT) 32-12.5 MG tablet Take 1 tablet by mouth daily. 90 tablet 0  . Multiple Vitamin (MULTIVITAMIN) tablet Take 1 tablet by mouth daily.    . Oxycodone HCl 10 MG TABS Take 1 tablet by mouth 5 (five) times daily.  0  . senna (SENOKOT) 8.6 MG TABS tablet Take 2 tablets (17.2 mg total) by mouth at bedtime. 120 each 0  . traZODone (DESYREL) 100 MG tablet Take 1 tablet (100 mg total) by mouth at bedtime as needed for sleep. 90 tablet 1  . Cholecalciferol 2000 units TABS Take 2 tablets (4,000 Units total) by mouth daily. 180 tablet 1  . ferrous sulfate 325 (65 FE) MG tablet Take 1 tablet (325 mg total) by mouth 3 (three) times daily with meals. 270 tablet 1  . zinc gluconate 50 MG tablet Take 1 tablet (50 mg total) by mouth daily. 90 tablet 1   No facility-administered medications prior to visit.     ROS Review of Systems  Constitutional: Positive for fatigue. Negative for appetite change, diaphoresis, fever and unexpected weight change.  HENT: Negative.  Negative for sore throat, trouble swallowing and voice change.   Eyes: Negative for visual disturbance.  Respiratory: Negative for cough, chest tightness, shortness of breath, wheezing and stridor.   Cardiovascular: Positive for  chest pain. Negative for palpitations and leg swelling.  Gastrointestinal: Negative for abdominal pain, constipation, diarrhea, nausea and vomiting.  Genitourinary: Negative for decreased urine volume, difficulty urinating, dysuria, flank pain, hematuria and urgency.  Musculoskeletal: Positive for arthralgias. Negative for back pain, myalgias, neck pain and neck stiffness.  Skin: Negative.  Negative for color change, pallor and rash.  Neurological: Negative.  Negative for dizziness, weakness, light-headedness and numbness.  Hematological: Negative for adenopathy. Does not bruise/bleed easily.  Psychiatric/Behavioral: Negative.     Objective:  BP 132/82 (BP Location: Left Arm, Patient Position: Sitting, Cuff Size: Large)   Pulse 75   Temp 98.2 F (36.8 C) (Oral)   Resp 16   Ht 5' 8"  (1.727 m)   Wt 199 lb 12 oz (90.6 kg)   SpO2 99%   BMI 30.37 kg/m   BP Readings from Last 3 Encounters:  04/11/18 132/82  03/01/18 118/62  01/28/18 140/80    Wt Readings from Last 3 Encounters:  04/11/18 199 lb 12 oz (90.6 kg)  03/01/18 203 lb 12.8 oz (92.4 kg)  01/28/18 211 lb 12 oz (96 kg)    Physical Exam Vitals signs reviewed.  Constitutional:      Appearance: He is not ill-appearing or diaphoretic.  HENT:     Nose: Nose normal. No congestion or rhinorrhea.     Mouth/Throat:     Mouth: Mucous membranes are moist. Mucous membranes are pale.  Pharynx: Oropharynx is clear. No oropharyngeal exudate or posterior oropharyngeal erythema.  Eyes:     General: No scleral icterus.    Conjunctiva/sclera: Conjunctivae normal.  Neck:     Musculoskeletal: Normal range of motion and neck supple. No neck rigidity or muscular tenderness.  Cardiovascular:     Rate and Rhythm: Normal rate and regular rhythm.     Pulses: Normal pulses.     Heart sounds: No murmur. No friction rub. No gallop.   Pulmonary:     Effort: Pulmonary effort is normal. No respiratory distress.     Breath sounds: No stridor.  No wheezing, rhonchi or rales.  Chest:     Chest wall: Tenderness present.    Abdominal:     General: Bowel sounds are normal.     Palpations: There is no mass.     Tenderness: There is no abdominal tenderness. There is no right CVA tenderness, left CVA tenderness or guarding.  Musculoskeletal: Normal range of motion.        General: No swelling.     Right lower leg: No edema.     Left lower leg: No edema.  Skin:    General: Skin is warm and dry.     Coloration: Skin is not pale.     Findings: No erythema or rash.  Neurological:     General: No focal deficit present.     Mental Status: He is oriented to person, place, and time. Mental status is at baseline.  Psychiatric:        Mood and Affect: Mood normal.        Behavior: Behavior normal.        Thought Content: Thought content normal.        Judgment: Judgment normal.     Lab Results  Component Value Date   WBC 6.6 04/11/2018   HGB 8.5 Repeated and verified X2. (L) 04/11/2018   HCT 27.3 (L) 04/11/2018   PLT 312.0 04/11/2018   GLUCOSE 95 01/28/2018   CHOL 118 10/25/2017   TRIG 52.0 10/25/2017   HDL 47.90 10/25/2017   LDLCALC 60 10/25/2017   ALT 16 09/05/2016   AST 15 09/05/2016   NA 134 (L) 01/28/2018   K 4.1 01/28/2018   CL 102 01/28/2018   CREATININE 0.84 01/28/2018   BUN 7 01/28/2018   CO2 25 01/28/2018   TSH 1.92 04/24/2017   HGBA1C 5.7 10/25/2017    US Scrotum W/doppler  Result Date: 10/24/2017 CLINICAL DATA:  Scrotal mass and pain EXAM: SCROTAL ULTRASOUND DOPPLER ULTRASOUND OF THE TESTICLES TECHNIQUE: Complete ultrasound examination of the testicles, epididymis, and other scrotal structures was performed. Color and spectral Doppler ultrasound were also utilized to evaluate blood flow to the testicles. COMPARISON:  None. FINDINGS: Right testicle Measurements: 4.5 x 2.4 x 3.0 cm. No mass or microlithiasis visualized. Left testicle Measurements: 4.6 x 2.7 x 2.7 cm. No mass or microlithiasis visualized. Right  epididymis:  Normal in size and appearance. Left epididymis:  Normal in size and appearance. Hydrocele:  Small left hydrocele Varicocele:  None visualized. Pulsed Doppler interrogation of both testes demonstrates normal low resistance arterial and venous waveforms bilaterally. IMPRESSION: No acute abnormality.  Small left hydrocele. Electronically Signed   By: Franchot Gallo M.D.   On: 10/24/2017 15:32    No results found.  Assessment & Plan:   Lance was seen today for anemia and hypertension.  Diagnoses and all orders for this visit:  Other iron deficiency anemia- His H/H and iron  levels are falling despite oral iron and he is symptomatic so I have ordered an iron infusion and has asked him to see GI to screen for GI sources of blood loss. -     CBC with Differential/Platelet; Future -     IBC panel; Future -     Ferritin; Future -     Ambulatory referral to Gastroenterology  Vitamin B12 deficiency anemia due to intrinsic factor deficiency- His B12 level is falling, I have asked him to get B12 injections monhtly -     CBC with Differential/Platelet; Future -     Vitamin B12; Future -     Folate; Future  Gastric bypass status for obesity -     CBC with Differential/Platelet; Future -     Vitamin B1; Future -     Zinc; Future -     Cholecalciferol 50 MCG (2000 UT) TABS; Take 2 tablets (4,000 Units total) by mouth daily.  Zinc deficiency- His zinc level is low. I have asked him to restart the zinc supplement. -     Zinc; Future  Deficiency anemia- See above -     CBC with Differential/Platelet; Future -     Vitamin B1; Future -     Reticulocytes; Future  Essential hypertension- His BP is well controlled, lytes and renal function are normal.  Vitamin D deficiency- His Vit D level remains low. I have asked him to restart a Vit D supplement -     VITAMIN D 25 Hydroxy (Vit-D Deficiency, Fractures); Future -     Cholecalciferol 50 MCG (2000 UT) TABS; Take 2 tablets (4,000 Units  total) by mouth daily.  Chest wall pain- Based on his symptoms, exam, and normal chest x-ray this appears to be a chest wall contusion.  I think the best treatment option is for him to start a brief course of NSAIDs for a week or 2. -     ketorolac (TORADOL) injection 60 mg -     DG Chest 2 View; Future -     ibuprofen (ADVIL,MOTRIN) 600 MG tablet; Take 1 tablet (600 mg total) by mouth every 8 (eight) hours as needed.   I have discontinued Jamol Ginyard. Alguire's ferrous sulfate. I have also changed his Cholecalciferol. Additionally, I am having him start on ibuprofen. Lastly, I am having him maintain his multivitamin, senna, Oxycodone HCl, traZODone, candesartan-hydrochlorothiazide, and zinc gluconate. We administered ketorolac.  Meds ordered this encounter  Medications  . ketorolac (TORADOL) injection 60 mg  . ibuprofen (ADVIL,MOTRIN) 600 MG tablet    Sig: Take 1 tablet (600 mg total) by mouth every 8 (eight) hours as needed.    Dispense:  60 tablet    Refill:  0  . Cholecalciferol 50 MCG (2000 UT) TABS    Sig: Take 2 tablets (4,000 Units total) by mouth daily.    Dispense:  180 tablet    Refill:  1  . zinc gluconate 50 MG tablet    Sig: Take 1 tablet (50 mg total) by mouth daily.    Dispense:  90 tablet    Refill:  1     Follow-up: Return if symptoms worsen or fail to improve.  [name], MD

## 2018-04-11 NOTE — Patient Instructions (Signed)
Chest Wall Pain Chest wall pain is pain in or around the bones and muscles of your chest. Sometimes, an injury causes this pain. Excessive coughing or overuse of arm and chest muscles may also cause chest wall pain. Sometimes, the cause may not be known. This pain may take several weeks or longer to get better. Follow these instructions at home: Managing pain, stiffness, and swelling   If directed, put ice on the painful area: ? Put ice in a plastic bag. ? Place a towel between your skin and the bag. ? Leave the ice on for 20 minutes, 2-3 times per day. Activity  Rest as told by your health care provider.  Avoid activities that cause pain. These include any activities that use your chest muscles or your abdominal and side muscles to lift heavy items. Ask your health care provider what activities are safe for you. General instructions   Take over-the-counter and prescription medicines only as told by your health care provider.  Do not use any products that contain nicotine or tobacco, such as cigarettes, e-cigarettes, and chewing tobacco. These can delay healing after injury. If you need help quitting, ask your health care provider.  Keep all follow-up visits as told by your health care provider. This is important. Contact a health care provider if:  You have a fever.  Your chest pain becomes worse.  You have new symptoms. Get help right away if:  You have nausea or vomiting.  You feel sweaty or light-headed.  You have a cough with mucus from your lungs (sputum) or you cough up blood.  You develop shortness of breath. These symptoms may represent a serious problem that is an emergency. Do not wait to see if the symptoms will go away. Get medical help right away. Call your local emergency services (911 in the [place] Do not drive yourself to the hospital. Summary  Chest wall pain is pain in or around the bones and muscles of your chest.  Depending on the cause, it may be  treated with ice, rest, medicines, and avoiding activities that cause pain.  Contact a health care provider if you have a fever, worsening chest pain, or new symptoms.  Get help right away if you feel light-headed or you develop shortness of breath. These symptoms may be an emergency. This information is not intended to replace advice given to you by your health care provider. Make sure you discuss any questions you have with your health care provider. Document Released: 02/20/2005 Document Revised: 08/23/2017 Document Reviewed: 08/23/2017 Elsevier Interactive Patient Education  2019 [employer].

## 2018-04-13 ENCOUNTER — Encounter: Payer: Self-pay | Admitting: Internal Medicine

## 2018-04-13 MED ORDER — ZINC GLUCONATE 50 MG PO TABS: 50.0000 mg | ORAL_TABLET | Freq: Every day | ORAL | 1 refills | Status: DC

## 2018-04-14 ENCOUNTER — Other Ambulatory Visit: Payer: Self-pay | Admitting: Internal Medicine

## 2018-04-14 ENCOUNTER — Encounter: Payer: Self-pay | Admitting: Internal Medicine

## 2018-04-14 DIAGNOSIS — E519 Thiamine deficiency, unspecified: Secondary | ICD-10-CM

## 2018-04-14 DIAGNOSIS — Z9884 Bariatric surgery status: Secondary | ICD-10-CM

## 2018-04-14 LAB — ZINC: Zinc: 45 ug/dL — ABNORMAL LOW (ref 60–130)

## 2018-04-14 LAB — RETICULOCYTES
ABS Retic: 30310 cells/uL (ref 25000–9000)
Retic Ct Pct: 0.7 %

## 2018-04-14 LAB — VITAMIN B1: Vitamin B1 (Thiamine): <6 nmol/L — ABNORMAL LOW (ref 8–30)

## 2018-04-14 MED ORDER — VITAMIN B-1 100 MG PO TABS: 100.0000 mg | ORAL_TABLET | Freq: Every day | ORAL | 1 refills | Status: DC

## 2018-04-16 ENCOUNTER — Ambulatory Visit (INDEPENDENT_AMBULATORY_CARE_PROVIDER_SITE_OTHER): Payer: BLUE CROSS/BLUE SHIELD

## 2018-04-16 ENCOUNTER — Ambulatory Visit (INDEPENDENT_AMBULATORY_CARE_PROVIDER_SITE_OTHER): Payer: BLUE CROSS/BLUE SHIELD | Admitting: Gastroenterology

## 2018-04-16 ENCOUNTER — Encounter: Payer: Self-pay | Admitting: Gastroenterology

## 2018-04-16 ENCOUNTER — Encounter: Payer: Self-pay | Admitting: Internal Medicine

## 2018-04-16 ENCOUNTER — Other Ambulatory Visit (INDEPENDENT_AMBULATORY_CARE_PROVIDER_SITE_OTHER): Payer: BLUE CROSS/BLUE SHIELD

## 2018-04-16 VITALS — BP 150/80 | HR 80 | Ht 67.0 in | Wt 195.0 lb

## 2018-04-16 DIAGNOSIS — Z9884 Bariatric surgery status: Secondary | ICD-10-CM

## 2018-04-16 DIAGNOSIS — E538 Deficiency of other specified B group vitamins: Secondary | ICD-10-CM

## 2018-04-16 DIAGNOSIS — R945 Abnormal results of liver function studies: Secondary | ICD-10-CM

## 2018-04-16 DIAGNOSIS — K219 Gastro-esophageal reflux disease without esophagitis: Secondary | ICD-10-CM | POA: Diagnosis not present

## 2018-04-16 DIAGNOSIS — D509 Iron deficiency anemia, unspecified: Secondary | ICD-10-CM | POA: Diagnosis not present

## 2018-04-16 DIAGNOSIS — Z800 Family history of malignant neoplasm of digestive organs: Secondary | ICD-10-CM | POA: Diagnosis not present

## 2018-04-16 DIAGNOSIS — D510 Vitamin B12 deficiency anemia due to intrinsic factor deficiency: Secondary | ICD-10-CM

## 2018-04-16 DIAGNOSIS — M13841 Other specified arthritis, right hand: Secondary | ICD-10-CM | POA: Diagnosis not present

## 2018-04-16 DIAGNOSIS — R7989 Other specified abnormal findings of blood chemistry: Secondary | ICD-10-CM

## 2018-04-16 LAB — HEPATIC FUNCTION PANEL
ALT: 20 U/L (ref 0–53)
AST: 23 U/L (ref 0–37)
Albumin: 3.7 g/dL (ref 3.5–5.2)
Alkaline Phosphatase: 69 U/L (ref 39–117)
Bilirubin, Direct: 0.1 mg/dL (ref 0.0–0.3)
Total Bilirubin: 0.4 mg/dL (ref 0.2–1.2)
Total Protein: 6.7 g/dL (ref 6.0–8.3)

## 2018-04-16 LAB — IGA: IgA: 247 mg/dL (ref 68–378)

## 2018-04-16 MED ORDER — SUPREP BOWEL PREP KIT 17.5-3.13-1.6 GM/177ML PO SOLN: 1.0000 | ORAL | 0 refills | Status: DC

## 2018-04-16 MED ORDER — CYANOCOBALAMIN 1000 MCG/ML IJ SOLN: 1000.0000 ug | Freq: Once | INTRAMUSCULAR | 3 refills | Status: DC

## 2018-04-16 MED ORDER — FAMOTIDINE 40 MG PO TABS: 40.0000 mg | ORAL_TABLET | Freq: Every day | ORAL | 2 refills | Status: DC

## 2018-04-16 MED ORDER — CYANOCOBALAMIN 1000 MCG/ML IJ SOLN
1000.0000 ug | INTRAMUSCULAR | Status: DC
  Administered 2018-04-16: 1000 ug via INTRAMUSCULAR

## 2018-04-16 MED ORDER — "INSULIN SYRINGE/NEEDLE 28G X 1/2"" 1 ML MISC": 0 refills | Status: DC

## 2018-04-16 NOTE — Progress Notes (Signed)
[place] VISIT   Primary Care Provider [name], MD [address] [address] [phone]  Referring Provider Dr. Kelton Pillar  Patient Profile: Bradley [name] is a 48 y.o. male with a pmh significant for s/p Mini Gastric Bypass, IDA (felt secondary to bypass and on IV supplementation), anxiety/depression, CAD, arthritis, GERD, spinal stenosis, history of urinary stones, chronic pain syndrome (on chronic narcotics), FHx Colon Cancer (Mother).  The patient presents to the [facility] for an evaluation and management of problem(s) noted below:  Problem List 1. Iron deficiency anemia, unspecified iron deficiency anemia type   2. Gastroesophageal reflux disease without esophagitis   3. Family history of colon cancer in mother   55. History of gastric bypass - reported Mini-Gastric Bypass   5. History of Abnormal LFTs     History of Present Illness: This is the patient's first visit to the outpatient [place] clinic.  The patient's initial referral suggested that this was for evaluation of NASH/liver test abnormalities.  However, the patient has not had any liver tests checked in over 1-1/2 years.  With that being said the patient has had longstanding issues of iron deficiency anemia going back for the last 11 to 12 years.  This is been attributed to his history of a gastric bypass.  He reports having a mini gastric bypass and losing over 200 pounds.  This [place].  He takes iron on a daily basis but has required IV infusions.  He has 2-3 bowel movements per day and previously had more before his use in need of chronic narcotics.  His opioids are used for chronic pain syndrome.  The patient himself describes having intermittent issues of GERD for which he has been on PPIs in the past but has not been on any since.  He otherwise describes no significant abdominal pains or nausea or vomiting.  He is  never had an upper or lower endoscopy.  He does not take significant nonsteroidals or BC/Goody powders.  He describes never being told he had liver problems.  However, when going back to his chart over the course of 12 years of history we have a few slight elevations in transferases but no abnormalities since 2017.  He is scheduled for an IV iron infusion tomorrow.  His last IV infusion of iron was many months ago.  He has not been on a true regular schedule for a while.  GI Review of Systems Positive as above Negative for dysphagia, odynophagia, nausea, vomiting, jaundice, change in appetite, bowel habit changes, melena, hematochezia  Review of Systems General: Positive for mild weight loss over the course the last 6 months but this has been intentional; denies fevers/chills HEENT: Denies oral lesions Cardiovascular: Denies chest pain Pulmonary: Denies shortness of breath Gastroenterological: See HPI Genitourinary: Denies darkened urine or hematuria Hematological: Denies easy bruising/bleeding Endocrine: Denies temperature intolerance Dermatological: Denies jaundice Psychological: Mood is stable Musculoskeletal: Denies new arthralgias   Medications Current Outpatient Medications  Medication Sig Dispense Refill  . candesartan-hydrochlorothiazide (ATACAND HCT) 32-12.5 MG tablet Take 1 tablet by mouth daily. 90 tablet 0  . Cholecalciferol 50 MCG (2000 UT) TABS Take 2 tablets (4,000 Units total) by mouth daily. 180 tablet 1  . ferrous sulfate 325 (65 FE) MG tablet Take 325 mg by mouth daily with breakfast.    . ibuprofen (ADVIL,MOTRIN) 600 MG tablet Take 1 tablet (600 mg total) by mouth every 8 (eight) hours  as needed. 60 tablet 0  . Multiple Vitamin (MULTIVITAMIN) tablet Take 1 tablet by mouth daily.    . Oxycodone HCl 10 MG TABS Take 1 tablet by mouth 5 (five) times daily.  0  . thiamine (VITAMIN B-1) 100 MG tablet Take 1 tablet (100 mg total) by mouth daily. 90 tablet 1  . zinc  gluconate 50 MG tablet Take 1 tablet (50 mg total) by mouth daily. 90 tablet 1  . famotidine (PEPCID) 40 MG tablet Take 1 tablet (40 mg total) by mouth daily. 30 tablet 2  . INS SYRINGE/NEEDLE 1CC/28G 28G X 1/2" 1 ML MISC Use to inject b12 monthly 10 each 0  . SUPREP BOWEL PREP KIT 17.5-3.13-1.6 GM/177ML SOLN Take 1 kit by mouth as directed. For colonoscopy prep 2 Bottle 0   Current Facility-Administered Medications  Medication Dose Route Frequency Provider Last Rate Last Dose  . cyanocobalamin ((VITAMIN B-12)) injection 1,000 mcg  1,000 mcg Intramuscular Q30 days [name], MD        Allergies Allergies  Allergen Reactions  . Codeine Hives    Hives and itching    Histories Past Medical History:  Diagnosis Date  . Anemia    since gastric bypass  . Anxiety   . Aortic valve sclerosis   . Arthritis   . Arthropathy, unspecified, other specified sites   . CAD (coronary artery disease)   . Contact with or exposure to venereal diseases   . Depression   . Displacement of intervertebral disc, site unspecified, without myelopathy   . GERD (gastroesophageal reflux disease)   . Heart murmur    " slight, [place] said nothing to worry about."  . History of kidney stones   . Hypertension     " no longer have hypertension since gastric bypass "  . Lumbago   . Obesity, unspecified   . Other, mixed, or unspecified nondependent drug abuse, unspecified   . Personal history of urinary calculi   . Pneumonia   . Priapism   . Stenosis, spinal, lumbar    Past Surgical History:  Procedure Laterality Date  . ABDOMINAL SURGERY    . ANKLE SURGERY    . ANTERIOR LAT LUMBAR FUSION Left 02/24/2016   Procedure: LEFT LUMBAR TWO-THREE  ANTERIOR LATERAL LUMBAR FUSION  ;  Surgeon: Earnie Larsson, MD;  Location: [place];  Service: Neurosurgery;  Laterality: Left;  . BACK SURGERY    . GASTRIC BYPASS    . HERNIA REPAIR    . INCISION AND DRAINAGE ABSCESS Right 07/06/2004   great toe  . IRRIGATION AND  DEBRIDEMENT ABSCESS Right 07/08/2004   great toe  . KNEE ARTHROPLASTY Right 04/26/2017   Procedure: RIGHT TOTAL KNEE ARTHROPLASTY WITH COMPUTER NAVIGATION;  Surgeon: Rod Can, MD;  Location: [facility];  Service: Orthopedics;  Laterality: Right;  Needs RNFA  . KNEE SURGERY    . LUMBAR PERCUTANEOUS PEDICLE SCREW 1 LEVEL Left 02/24/2016   Procedure: LUMBAR PERCUTANEOUS PEDICLE SCREW 1 LEVEL;  Surgeon: Earnie Larsson, MD;  Location: [place];  Service: Neurosurgery;  Laterality: Left;  Marland Kitchen MICRODISCECTOMY LUMBAR Left 08/24/2008   T12 - L1  . panectomy     Social History   Socioeconomic History  . Marital status: Married    Spouse name: Not on file  . Number of children: 3  . Years of education: Not on file  . Highest education level: Not on file  Occupational History  . Occupation: disabled  Social Needs  . Financial resource strain:  Not on file  . Food insecurity:    Worry: Not on file    Inability: Not on file  . Transportation needs:    Medical: Not on file    Non-medical: Not on file  Tobacco Use  . Smoking status: Never Smoker  . Smokeless tobacco: Never Used  Substance and Sexual Activity  . Alcohol use: No  . Drug use: No  . Sexual activity: Yes  Lifestyle  . Physical activity:    Days per week: Not on file    Minutes per session: Not on file  . Stress: Not on file  Relationships  . Social connections:    Talks on phone: Not on file    Gets together: Not on file    Attends religious service: Not on file    Active member of club or organization: Not on file    Attends meetings of clubs or organizations: Not on file    Relationship status: Not on file  . Intimate partner violence:    Fear of current or ex partner: Not on file    Emotionally abused: Not on file    Physically abused: Not on file    Forced sexual activity: Not on file  Other Topics Concern  . Not on file  Social History Narrative  . Not on file   Family History  Problem Relation Age of Onset  .  Arthritis Mother   . Colon cancer Mother   . Breast cancer Mother        twice  . Kidney failure Mother   . Heart disease Father   . Hypertension Father   . Esophageal cancer Neg Hx   . Inflammatory bowel disease Neg Hx   . Liver disease Neg Hx   . Pancreatic cancer Neg Hx   . Stomach cancer Neg Hx    I have reviewed his medical, social, and family history in detail and updated the electronic medical record as necessary.    PHYSICAL EXAMINATION  BP (!) 150/80   Pulse 80   Ht 5' 7"  (1.702 m)   Wt 195 lb (88.5 kg)   BMI 30.54 kg/m  Wt Readings from Last 3 Encounters:  04/16/18 195 lb (88.5 kg)  04/11/18 199 lb 12 oz (90.6 kg)  03/01/18 203 lb 12.8 oz (92.4 kg)  GEN: NAD, appears stated age, doesn't appear chronically ill PSYCH: Cooperative, without pressured speech EYE: Conjunctivae pink, sclerae anicteric ENT: MMM, without oral ulcers, no erythema or exudates noted NECK: Supple CV: RR without R/Gs  RESP: CTAB posteriorly, without wheezing GI: NABS, soft, rounded, excess skin noted, NT, without rebound or guarding, no HSM appreciated MSK/EXT: Bilateral lower extremity edema present SKIN: Excess skin noted in upper thorax, no jaundice, no spider angiomata, no jaundice NEURO:  Alert & Oriented x 3, no focal deficits   REVIEW OF DATA  I reviewed the following data at the time of this encounter:  GI Procedures and Studies  No relevant studies to review  Laboratory Studies  Reviewed in epic   06/07/2006 13:39 07/23/2008 09:37 05/13/2009 00:25 10/05/2009 09:51 07/25/2012 11:22 04/08/2014 10:45 06/27/2014 05:59 09/29/2014 14:36 06/08/2015 13:51 02/10/2016 11:45 09/05/2016 14:11 04/16/2018 16:50  AST 17 38 (H) 29 24 24 17  53 (H) 19 18 46 (H) 15 23  ALT 30 43 46 25 23 14 43 13 14 47  16 20  Total Protein 7.1 5.9 (L) 6.0 6.1 6.5 6.8 7.6 6.9 7.0 6.7 7.2 6.7  Bilirubin, Direct 0.2 0.2  0.2  0.1  Total Bilirubin 1.2 0.6 0.7 1.1 0.5 0.3 0.8 0.4 0.5 0.4 0.5 0.4  Alkaline Phosphatase 42 49  61 76 53 62 74 63 54 82 75 69    Imaging Studies  No relevant studies to review   ASSESSMENT  Mr. Dygert is a 48 y.o. male with a pmh significant for s/p Mini Gastric Bypass, IDA (felt secondary to bypass and on IV supplementation), anxiety/depression, CAD, arthritis, GERD, spinal stenosis, history of urinary stones, chronic pain syndrome (on chronic narcotics), FHx Colon Cancer (Mother).  The patient is seen today for evaluation and management of:  1. Iron deficiency anemia, unspecified iron deficiency anemia type   2. Gastroesophageal reflux disease without esophagitis   3. Family history of colon cancer in mother   60. History of gastric bypass - reported Mini-Gastric Bypass   5. History of Abnormal LFTs    This is a hemodynamically stable patient who was referred for NASH evaluation however has not had significant LFT derangements since 2017 and in 2018 had normal liver tests.  Is not clear to me that the patient has fatty liver though he would have been at risk of it years ago and may still be at risk of it in the setting of how significant his weight loss was.  However if liver tests are normal there is no necessary need for significant liver biopsies or aggressive evaluation unless liver tests continue to have fluctuations.  At that point we could consider the role of a liver ultrasound with Doppler and may be yearly LFTs if they are normal.  We will see what his labs look like today since this was the initial consultation question.  That being said, the patient has a severe iron deficiency anemia and although gastric bypass can lead to patient's developing issues of iron deficiency anemia his seems much more significant than I would think about solely as a result of his previous surgical intervention.  He requires an upper and lower endoscopic evaluation and may require an a capsule endoscopy.  He will also need to be monitored with his iron infusions every 2 to 3 months in case he needs to  have things optimized.  We will see how he does with this particular iron infusion in approximately 4 to 5 weeks we may repeat the iron studies so that we can get a better sense of things and see if he actually requires more frequent IV iron infusions.  We could increase his oral iron to twice daily but not clear that there is much effect in this going from once to twice daily.  He has had GERD symptoms but we will plan on using Pepcid as it is infrequent and usually can be controlled based on what he eats.  I will hold on PPI therapy for now.  We will plan to proceed with colon cancer screening because of his family history of colon cancer in his mother but will also be done for evaluation of iron deficiency anemia.  Further work-up and management will be dictated by review of his outside records and we will try to get his surgical records to confirm his mini gastric bypass that he described rather than a true Roux-en-Y which would be the more specific one that I would think he would have a significant iron deficiency develop from otherwise.  All patient questions were answered, to the best of my ability, and the patient agrees to the aforementioned plan of action with follow-up as indicated.   PLAN  Laboratories as outlined below Initiate Pepcid 40 mg twice daily as needed Move forward with IV iron infusion Repeat iron indices in approximately 4 to 6 weeks to see if he has bumped appropriately as well as with a blood count Proceed with upper and lower endoscopies We will consider the role of video capsule endoscopy If LFTs are abnormal further work-up including further laboratory/serologies will be performed and a liver ultrasound can be ordered   Orders Placed This Encounter  Procedures  . Hepatic function panel  . IgA  . Tissue transglutaminase, IgA  . CBC  . Iron  . Iron Binding Cap (TIBC)  . Ferritin  . Ambulatory referral to Gastroenterology    New Prescriptions   FAMOTIDINE (PEPCID)  40 MG TABLET    Take 1 tablet (40 mg total) by mouth daily.   INS SYRINGE/NEEDLE 1CC/28G 28G X 1/2" 1 ML MISC    Use to inject b12 monthly   SUPREP BOWEL PREP KIT 17.5-3.13-1.6 GM/177ML SOLN    Take 1 kit by mouth as directed. For colonoscopy prep   Modified Medications   No medications on file    Planned Follow Up: No follow-ups on file.   Justice Britain, MD [facility] Office # [phone]

## 2018-04-16 NOTE — Addendum Note (Signed)
Addended by: Aviva Signs M on: 04/16/2018 11:06 AM   Modules accepted: Orders

## 2018-04-16 NOTE — Patient Instructions (Addendum)
If you are age 50 or older, your body mass index should be between 23-30. Your Body mass index is 30.54 kg/m. If this is out of the aforementioned range listed, please consider follow up with your Primary Care Provider.  If you are age 54 or younger, your body mass index should be between 19-25. Your Body mass index is 30.54 kg/m. If this is out of the aformentioned range listed, please consider follow up with your Primary Care Provider.    We have sent the following medications to your pharmacy for you to pick up at your convenience:  Pepcid 66m once daily as needed.   Your provider has requested that you go to the basement level for lab work before leaving today. Press "B" on the elevator. The lab is located at the first door on the left as you exit the elevator.  Today and again in 3 months. Order has been placed for future labs 3 months.   You have been scheduled for an endoscopy and colonoscopy. Please follow the written instructions given to you at your visit today. Please pick up your prep supplies at the pharmacy within the next 1-3 days. If you use inhalers (even only as needed), please bring them with you on the day of your procedure. Your physician has requested that you go to www.startemmi.com and enter the access code given to you at your visit today. This web site gives a general overview about your procedure. However, you should still follow specific instructions given to you by our office regarding your preparation for the procedure.  Thank you for choosing me and [facility].  Dr. MRush Landmark

## 2018-04-17 ENCOUNTER — Ambulatory Visit (HOSPITAL_COMMUNITY)
Admission: RE | Admit: 2018-04-17 | Discharge: 2018-04-17 | Disposition: A | Discharge status: Home / Self Care | Payer: BLUE CROSS/BLUE SHIELD | Source: Ambulatory Visit | Attending: Internal Medicine | Admitting: Internal Medicine

## 2018-04-17 ENCOUNTER — Other Ambulatory Visit: Payer: Self-pay | Admitting: Internal Medicine

## 2018-04-17 ENCOUNTER — Ambulatory Visit: Payer: BLUE CROSS/BLUE SHIELD

## 2018-04-17 DIAGNOSIS — Z9884 Bariatric surgery status: Secondary | ICD-10-CM

## 2018-04-17 DIAGNOSIS — D508 Other iron deficiency anemias: Secondary | ICD-10-CM | POA: Diagnosis not present

## 2018-04-17 LAB — TISSUE TRANSGLUTAMINASE, IGA: (tTG) Ab, IgA: 1 U/mL

## 2018-04-17 MED ORDER — SODIUM CHLORIDE 0.9 % IV SOLN
750.0000 mg | Freq: Once | INTRAVENOUS | Status: AC
  Administered 2018-04-17: 750 mg via INTRAVENOUS
  Filled 2018-04-17: qty 15

## 2018-04-17 MED ORDER — SODIUM CHLORIDE 0.9 % IV SOLN
INTRAVENOUS | Status: DC | PRN
  Administered 2018-04-17: 250 mL via INTRAVENOUS

## 2018-04-17 NOTE — Discharge Instructions (Signed)

## 2018-04-17 NOTE — Progress Notes (Signed)
[facility]  Procedure: Injectafer infusion  Note:  Patient received  Injectafer infusion,tolerated the full amount. Post 30 minutes vital signs stable. Discharge instructions provided,verbalized understanding. Alert, oriented and ambulatory at discharge.

## 2018-04-18 ENCOUNTER — Encounter: Payer: Self-pay | Admitting: Gastroenterology

## 2018-04-19 ENCOUNTER — Encounter: Payer: Self-pay | Admitting: Gastroenterology

## 2018-04-19 ENCOUNTER — Other Ambulatory Visit: Payer: Self-pay

## 2018-04-19 DIAGNOSIS — R945 Abnormal results of liver function studies: Secondary | ICD-10-CM | POA: Insufficient documentation

## 2018-04-19 DIAGNOSIS — R7989 Other specified abnormal findings of blood chemistry: Secondary | ICD-10-CM | POA: Insufficient documentation

## 2018-04-19 DIAGNOSIS — Z800 Family history of malignant neoplasm of digestive organs: Secondary | ICD-10-CM | POA: Insufficient documentation

## 2018-04-19 DIAGNOSIS — K219 Gastro-esophageal reflux disease without esophagitis: Secondary | ICD-10-CM | POA: Insufficient documentation

## 2018-04-19 DIAGNOSIS — K7581 Nonalcoholic steatohepatitis (NASH): Secondary | ICD-10-CM

## 2018-04-19 DIAGNOSIS — K7469 Other cirrhosis of liver: Secondary | ICD-10-CM

## 2018-04-19 NOTE — Progress Notes (Signed)
Having discussed the case with Endocrinology, there is concern whether some of his issues would be a result of underlying liver disease. His LFTs on our consultation date are normal. No imaging has been performed of his liver in our system, but there are LFT abnormalities going back to 2017. We will plan to perform a Liver U/S to evaluate and see if any other major changes, but otherwise, further liver workup via serologies or biopsy will be on hold unless something is seen concerning on repeat LFTs in 3-6 months and/or the liver U/S.  Justice Britain, MD [facility] Office # [phone]

## 2018-04-19 NOTE — Progress Notes (Signed)
Mansouraty, Telford Nab., MD  You 3 hours ago (8:24 AM)    Bradley [name], since talking with the patient's Endocrinologist, I would like to pursue a Liver U/S with Doppler. LFTs were normal on evaluation this week but he has had abnormalities a few years ago. Please order this and let him know that I discussed this with his Endocrinologist. Can be performed at his convenience in the coming weeks.  Let's also plan repeat LFTs in 25-month (can just come in to clinic - future order).  Thanks.  GM   Routing comment

## 2018-04-19 NOTE — Progress Notes (Signed)
Status: Signed    Mansouraty, Telford Nab., MD  You 3 hours ago (8:24 AM)    Bradley [name], since talking with the patient's Endocrinologist, I would like to pursue a Liver U/S with Doppler. LFTs were normal on evaluation this week but he has had abnormalities a few years ago. Please order this and let him know that I discussed this with his Endocrinologist. Can be performed at his convenience in the coming weeks.  Let's also plan repeat LFTs in 80-month (can just come in to clinic - future order).  Thanks.  GM   Routing comment        BDebbe Mounts CMA at 04/19/2018 11:16 AM   Status: Signed    Pt has been scheduled for Liver U/S with Doppler @ [facility] on 04/25/2018- 10:00am. Scheduled repeat Hepatic Function Panel for 4 months. Pt has been informed. States that he understands.

## 2018-04-19 NOTE — Progress Notes (Signed)
Pt has been scheduled for Liver U/S with Doppler @ [facility] on 04/25/2018- 10:00am. Scheduled repeat Hepatic Function Panel for 4 months. Pt has been informed. States that he understands.

## 2018-04-23 NOTE — Progress Notes (Signed)
I have reviewed and agree.

## 2018-04-25 ENCOUNTER — Ambulatory Visit (HOSPITAL_COMMUNITY)
Admission: RE | Admit: 2018-04-25 | Discharge: 2018-04-25 | Disposition: A | Discharge status: Home / Self Care | Payer: BLUE CROSS/BLUE SHIELD | Source: Ambulatory Visit | Attending: Gastroenterology | Admitting: Gastroenterology

## 2018-04-25 DIAGNOSIS — K769 Liver disease, unspecified: Secondary | ICD-10-CM | POA: Diagnosis not present

## 2018-04-25 DIAGNOSIS — K7581 Nonalcoholic steatohepatitis (NASH): Secondary | ICD-10-CM | POA: Insufficient documentation

## 2018-04-25 DIAGNOSIS — R7989 Other specified abnormal findings of blood chemistry: Secondary | ICD-10-CM

## 2018-04-25 DIAGNOSIS — K7469 Other cirrhosis of liver: Secondary | ICD-10-CM | POA: Insufficient documentation

## 2018-04-25 DIAGNOSIS — R945 Abnormal results of liver function studies: Secondary | ICD-10-CM | POA: Diagnosis not present

## 2018-05-01 DIAGNOSIS — M25561 Pain in right knee: Secondary | ICD-10-CM | POA: Diagnosis not present

## 2018-05-01 DIAGNOSIS — M25511 Pain in right shoulder: Secondary | ICD-10-CM | POA: Diagnosis not present

## 2018-05-01 DIAGNOSIS — M7581 Other shoulder lesions, right shoulder: Secondary | ICD-10-CM | POA: Diagnosis not present

## 2018-05-01 DIAGNOSIS — M545 Low back pain: Secondary | ICD-10-CM | POA: Diagnosis not present

## 2018-05-01 DIAGNOSIS — M961 Postlaminectomy syndrome, not elsewhere classified: Secondary | ICD-10-CM | POA: Diagnosis not present

## 2018-05-03 ENCOUNTER — Ambulatory Visit (AMBULATORY_SURGERY_CENTER): Payer: BLUE CROSS/BLUE SHIELD | Admitting: Gastroenterology

## 2018-05-03 ENCOUNTER — Encounter: Payer: Self-pay | Admitting: Gastroenterology

## 2018-05-03 VITALS — BP 116/78 | HR 75 | Temp 96.2°F | Resp 18 | Ht 67.0 in | Wt 195.0 lb

## 2018-05-03 DIAGNOSIS — K2970 Gastritis, unspecified, without bleeding: Secondary | ICD-10-CM | POA: Diagnosis not present

## 2018-05-03 DIAGNOSIS — K2990 Gastroduodenitis, unspecified, without bleeding: Secondary | ICD-10-CM

## 2018-05-03 DIAGNOSIS — K3189 Other diseases of stomach and duodenum: Secondary | ICD-10-CM | POA: Diagnosis not present

## 2018-05-03 DIAGNOSIS — R945 Abnormal results of liver function studies: Secondary | ICD-10-CM | POA: Diagnosis not present

## 2018-05-03 DIAGNOSIS — D509 Iron deficiency anemia, unspecified: Secondary | ICD-10-CM

## 2018-05-03 DIAGNOSIS — K219 Gastro-esophageal reflux disease without esophagitis: Secondary | ICD-10-CM

## 2018-05-03 DIAGNOSIS — Z538 Procedure and treatment not carried out for other reasons: Secondary | ICD-10-CM

## 2018-05-03 MED ORDER — OMEPRAZOLE 40 MG PO CPDR: 40.0000 mg | DELAYED_RELEASE_CAPSULE | Freq: Every day | ORAL | 3 refills | Status: DC

## 2018-05-03 MED ORDER — SODIUM CHLORIDE 0.9 % IV SOLN: 500.0000 mL | Freq: Once | INTRAVENOUS | Status: DC

## 2018-05-03 NOTE — Op Note (Signed)
[facility] Patient Name: Bradley [name] Procedure Date: 05/03/2018 10:56 AM MRN: [id number] Endoscopist: Mauri Pole , MD Age: 48 Referring MD:  Date of Birth: 1970/04/25 Gender: Male Account #: [id number] Procedure:                Upper GI endoscopy Indications:              Suspected upper gastrointestinal bleeding in                            patient with unexplained iron deficiency anemia Medicines:                Monitored Anesthesia Care Procedure:                Pre-Anesthesia Assessment:                           - Prior to the procedure, a History and Physical                            was performed, and patient medications and                            allergies were reviewed. The patient's tolerance of                            previous anesthesia was also reviewed. The risks                            and benefits of the procedure and the sedation                            options and risks were discussed with the patient.                            All questions were answered, and informed consent                            was obtained. Prior Anticoagulants: The patient has                            taken no previous anticoagulant or antiplatelet                            agents. ASA Grade Assessment: II - A patient with                            mild systemic disease. After reviewing the risks                            and benefits, the patient was deemed in                            satisfactory condition to undergo the procedure.  After obtaining informed consent, the endoscope was                            passed under direct vision. Throughout the                            procedure, the patient's blood pressure, pulse, and                            oxygen saturations were monitored continuously. The                            Endoscope was introduced through the mouth, and                            advanced  to the afferent and efferent jejunal                            loops. The upper GI endoscopy was accomplished                            without difficulty. The patient tolerated the                            procedure well. Scope In: Scope Out: Findings:                 LA Grade A (one or more mucosal breaks less than 5                            mm, not extending between tops of 2 mucosal folds)                            esophagitis was found 36 to 37 cm from the                            incisors. Regular Z-line otherwise esophagus                            appeared normal.                           Evidence of a gastric bypass was found. A gastric                            pouch with a medium size was found containing small                            amount heme. The staple line appeared intact. The                            gastrojejunal anastomosis was characterized by  erosion. This was traversed. The pouch-to-jejunum                            limb was characterized by erosion and erythema. The                            jejunojejunal anastomosis was characterized by                            healthy appearing mucosa. The duodenum-to-jejunum                            limb was examined. Biopsies were taken with a cold                            forceps for histology.                           The examined jejunum was normal. Complications:            No immediate complications. Estimated Blood Loss:     Estimated blood loss was minimal. Impression:               - LA Grade A reflux esophagitis.                           - Gastric bypass with a medium-sized pouch and                            intact staple line. Gastrojejunal anastomosis                            characterized by erosion. Mild gastritis with                            erosions and adherent heme. Biopsied.                           - Normal examined jejunum. Recommendation:            - Resume previous diet.                           - Continue present medications.                           - No aspirin, ibuprofen, naproxen, or other                            non-steroidal anti-inflammatory drugs.                           - Await pathology results.                           - See the other procedure note for documentation of  additional recommendations. Mauri Pole, MD 05/03/2018 11:25:29 AM This report has been signed electronically.

## 2018-05-03 NOTE — Patient Instructions (Signed)
Handouts given on gastritis. Will need extensive prep in the future. No NSAIDS (aspirin, ibuprofen, naproxen).   YOU HAD AN ENDOSCOPIC PROCEDURE TODAY AT [facility]:   Refer to the procedure report that was given to you for any specific questions about what was found during the examination.  If the procedure report does not answer your questions, please call your gastroenterologist to clarify.  If you requested that your care partner not be given the details of your procedure findings, then the procedure report has been included in a sealed envelope for you to review at your convenience later.  YOU SHOULD EXPECT: Some feelings of bloating in the abdomen. Passage of more gas than usual.  Walking can help get rid of the air that was put into your GI tract during the procedure and reduce the bloating. If you had a lower endoscopy (such as a colonoscopy or flexible sigmoidoscopy) you may notice spotting of blood in your stool or on the toilet paper. If you underwent a bowel prep for your procedure, you may not have a normal bowel movement for a few days.  Please Note:  You might notice some irritation and congestion in your nose or some drainage.  This is from the oxygen used during your procedure.  There is no need for concern and it should clear up in a day or so.  SYMPTOMS TO REPORT IMMEDIATELY:   Following lower endoscopy (colonoscopy or flexible sigmoidoscopy):  Excessive amounts of blood in the stool  Significant tenderness or worsening of abdominal pains  Swelling of the abdomen that is new, acute  Fever of 100F or higher   Following upper endoscopy (EGD)  Vomiting of blood or coffee ground material  New chest pain or pain under the shoulder blades  Painful or persistently difficult swallowing  New shortness of breath  Fever of 100F or higher  Black, tarry-looking stools  For urgent or emergent issues, a gastroenterologist can be reached at any hour by calling  [phone].   DIET:  We do recommend a small meal at first, but then you may proceed to your regular diet.  Drink plenty of fluids but you should avoid alcoholic beverages for 24 hours.  ACTIVITY:  You should plan to take it easy for the rest of today and you should NOT DRIVE or use heavy machinery until tomorrow (because of the sedation medicines used during the test).    FOLLOW UP: Our staff will call the number listed on your records the next business day following your procedure to check on you and address any questions or concerns that you may have regarding the information given to you following your procedure. If we do not reach you, we will leave a message.  However, if you are feeling well and you are not experiencing any problems, there is no need to return our call.  We will assume that you have returned to your regular daily activities without incident.  If any biopsies were taken you will be contacted by phone or by letter within the next 1-3 weeks.  Please call us at [phone] if you have not heard about the biopsies in 3 weeks.    SIGNATURES/CONFIDENTIALITY: You and/or your care partner have signed paperwork which will be entered into your electronic medical record.  These signatures attest to the fact that that the information above on your After Visit Summary has been reviewed and is understood.  Full responsibility of the confidentiality of this discharge information  lies with you and/or your care-partner.

## 2018-05-03 NOTE — Progress Notes (Signed)
Called to room to assist during endoscopic procedure.  Patient ID and intended procedure confirmed with present staff. Received instructions for my participation in the procedure from the performing physician.  

## 2018-05-03 NOTE — Op Note (Signed)
[place] Patient Name: Bradley [name] Procedure Date: 05/03/2018 10:56 AM MRN: [id number] Endoscopist: Mauri Pole , MD Age: 48 Referring MD:  Date of Birth: May 13, 1970 Gender: Male Account #: [id number] Procedure:                Colonoscopy Indications:              Unexplained iron deficiency anemia Medicines:                Monitored Anesthesia Care Procedure:                Pre-Anesthesia Assessment:                           - Prior to the procedure, a History and Physical                            was performed, and patient medications and                            allergies were reviewed. The patient's tolerance of                            previous anesthesia was also reviewed. The risks                            and benefits of the procedure and the sedation                            options and risks were discussed with the patient.                            All questions were answered, and informed consent                            was obtained. Prior Anticoagulants: The patient has                            taken no previous anticoagulant or antiplatelet                            agents. ASA Grade Assessment: II - A patient with                            mild systemic disease. After reviewing the risks                            and benefits, the patient was deemed in                            satisfactory condition to undergo the procedure.                           After obtaining informed consent, the colonoscope  was passed under direct vision. Throughout the                            procedure, the patient's blood pressure, pulse, and                            oxygen saturations were monitored continuously. The                            Colonoscope was introduced through the anus and                            advanced to the the descending colon for                            evaluation. This was the  intended extent. The                            colonoscopy was performed without difficulty. The                            patient tolerated the procedure well. The quality                            of the bowel preparation was inadequate. Scope In: Scope Out: Findings:                 The perianal and digital rectal examinations were                            normal.                           A moderate amount of stool was found in the rectum,                            in the sigmoid colon and in the descending colon,                            interfering with visualization. Complications:            No immediate complications. Estimated Blood Loss:     Estimated blood loss was minimal. Impression:               - Preparation of the colon was inadequate.                           - Stool in the rectum, in the sigmoid colon and in                            the descending colon.                           - No specimens collected. Recommendation:           - Patient has a contact number available for  emergencies. The signs and symptoms of potential                            delayed complications were discussed with the                            patient. Return to normal activities tomorrow.                            Written discharge instructions were provided to the                            patient.                           - Resume previous diet.                           - Continue present medications.                           - For future colonoscopy the patient will require                            an extended preparation. If there are any                            questions, please contact the gastroenterologist.                           - Repeat colonoscopy at the next available                            appointment with Dr Rush Landmark because the                            examination was incomplete. Mauri Pole, MD 05/03/2018  11:29:13 AM This report has been signed electronically.

## 2018-05-03 NOTE — Progress Notes (Signed)
PT taken to PACU. Monitors in place. VSS. Report given to RN. 

## 2018-05-06 ENCOUNTER — Telehealth: Payer: Self-pay

## 2018-05-06 NOTE — Telephone Encounter (Signed)
  Follow up Call-  Call back number 05/03/2018  Post procedure Call Back phone  # [phone]  Permission to leave phone message Yes  Some recent data might be hidden     Patient questions:  Do you have a fever, pain , or abdominal swelling? No. Pain Score  0 *  Have you tolerated food without any problems? Yes.    Have you been able to return to your normal activities? Yes.    Do you have any questions about your discharge instructions: Diet   No. Medications  No. Follow up visit  No.  Do you have questions or concerns about your Care? No.  Actions: * If pain score is 4 or above: No action needed, pain <4.

## 2018-05-08 ENCOUNTER — Other Ambulatory Visit: Payer: Self-pay | Admitting: Internal Medicine

## 2018-05-08 DIAGNOSIS — G4701 Insomnia due to medical condition: Secondary | ICD-10-CM

## 2018-05-08 DIAGNOSIS — G8929 Other chronic pain: Secondary | ICD-10-CM

## 2018-05-16 ENCOUNTER — Encounter: Payer: Self-pay | Admitting: Gastroenterology

## 2018-05-27 ENCOUNTER — Telehealth: Payer: Self-pay | Admitting: *Deleted

## 2018-05-27 NOTE — Telephone Encounter (Signed)
Covid-19 travel screening questions  Have you traveled in the last 14 days? no If yes where?  Do you now or have you had a fever in the last 14 days? no  Do you have any respiratory symptoms of shortness of breath or cough now or in the last 14 days? no  Do you have a medical history of Congestive Heart Failure? no  Do you have a medical history of lung disease? no  Do you have any family members or close contacts with diagnosed or suspected Covid-19? no       

## 2018-05-28 ENCOUNTER — Encounter: Payer: Self-pay | Admitting: Gastroenterology

## 2018-05-28 ENCOUNTER — Other Ambulatory Visit: Payer: Self-pay

## 2018-05-28 ENCOUNTER — Other Ambulatory Visit (INDEPENDENT_AMBULATORY_CARE_PROVIDER_SITE_OTHER): Payer: BLUE CROSS/BLUE SHIELD

## 2018-05-28 ENCOUNTER — Ambulatory Visit (INDEPENDENT_AMBULATORY_CARE_PROVIDER_SITE_OTHER): Payer: BLUE CROSS/BLUE SHIELD | Admitting: Internal Medicine

## 2018-05-28 ENCOUNTER — Encounter: Payer: Self-pay | Admitting: Internal Medicine

## 2018-05-28 ENCOUNTER — Ambulatory Visit (AMBULATORY_SURGERY_CENTER): Payer: Self-pay

## 2018-05-28 VITALS — BP 110/70 | HR 72 | Temp 97.4°F | Ht 68.0 in | Wt 175.0 lb

## 2018-05-28 VITALS — Temp 96.9°F | Ht 68.0 in | Wt 175.2 lb

## 2018-05-28 DIAGNOSIS — D510 Vitamin B12 deficiency anemia due to intrinsic factor deficiency: Secondary | ICD-10-CM | POA: Diagnosis not present

## 2018-05-28 DIAGNOSIS — J309 Allergic rhinitis, unspecified: Secondary | ICD-10-CM | POA: Diagnosis not present

## 2018-05-28 DIAGNOSIS — H6983 Other specified disorders of Eustachian tube, bilateral: Secondary | ICD-10-CM | POA: Diagnosis not present

## 2018-05-28 DIAGNOSIS — I10 Essential (primary) hypertension: Secondary | ICD-10-CM | POA: Diagnosis not present

## 2018-05-28 DIAGNOSIS — D508 Other iron deficiency anemias: Secondary | ICD-10-CM

## 2018-05-28 DIAGNOSIS — D509 Iron deficiency anemia, unspecified: Secondary | ICD-10-CM

## 2018-05-28 LAB — BASIC METABOLIC PANEL
BUN: 13 mg/dL (ref 6–23)
CO2: 26 mEq/L (ref 19–32)
Calcium: 9.1 mg/dL (ref 8.4–10.5)
Chloride: 106 mEq/L (ref 96–112)
Creatinine, Ser: 0.98 mg/dL (ref 0.40–1.50)
GFR: 81.07 mL/min (ref >60.00)
Glucose, Bld: 96 mg/dL (ref 70–99)
Potassium: 4.2 mEq/L (ref 3.5–5.1)
Sodium: 138 mEq/L (ref 135–145)

## 2018-05-28 LAB — CBC WITH DIFFERENTIAL/PLATELET
Basophils Absolute: 0 10*3/uL (ref 0.0–0.1)
Basophils Relative: 0.1 % (ref 0.0–3.0)
Eosinophils Absolute: 0 10*3/uL (ref 0.0–0.7)
Eosinophils Relative: 0.8 % (ref 0.0–5.0)
HCT: 39.9 % (ref 39.0–52.0)
Hemoglobin: 12.7 g/dL — ABNORMAL LOW (ref 13.0–17.0)
Lymphocytes Relative: 40.9 % (ref 12.0–46.0)
Lymphs Abs: 2.2 10*3/uL (ref 0.7–4.0)
MCHC: 31.8 g/dL (ref 30.0–36.0)
MCV: 73.8 fl — ABNORMAL LOW (ref 78.0–100.0)
Monocytes Absolute: 0.6 10*3/uL (ref 0.1–1.0)
Monocytes Relative: 11.8 % (ref 3.0–12.0)
Neutro Abs: 2.5 10*3/uL (ref 1.4–7.7)
Neutrophils Relative %: 46.4 % (ref 43.0–77.0)
Platelets: 241 10*3/uL (ref 150.0–400.0)
RBC: 5.4 Mil/uL (ref 4.22–5.81)
RDW: 30.3 % — ABNORMAL HIGH (ref 11.5–15.5)
WBC: 5.4 10*3/uL (ref 4.0–10.5)

## 2018-05-28 LAB — IBC PANEL
Iron: 125 ug/dL (ref 42–165)
Saturation Ratios: 51 % — ABNORMAL HIGH (ref 20.0–50.0)
Transferrin: 175 mg/dL — ABNORMAL LOW (ref 212.0–360.0)

## 2018-05-28 LAB — FERRITIN: Ferritin: 52.8 ng/mL (ref 22.0–322.0)

## 2018-05-28 MED ORDER — PEG 3350-KCL-NA BICARB-NACL 420 G PO SOLR: 4000.0000 mL | Freq: Once | ORAL | 0 refills | Status: AC

## 2018-05-28 MED ORDER — METHYLPREDNISOLONE ACETATE 80 MG/ML IJ SUSP
120.0000 mg | Freq: Once | INTRAMUSCULAR | Status: AC
  Administered 2018-05-28: 120 mg via INTRAMUSCULAR

## 2018-05-28 MED ORDER — TRIAMCINOLONE ACETONIDE 55 MCG/ACT NA AERO: 4.0000 | INHALATION_SPRAY | Freq: Every day | NASAL | 3 refills | Status: DC

## 2018-05-28 NOTE — Progress Notes (Signed)
Subjective:  Patient ID: Bradley [name], male    DOB: 1970-05-26  Age: 48 y.o. MRN: [id number]  CC: Hypertension; Anemia; and Allergic Rhinitis    HPI MALIQ PILLEY presents for the complaint of a several day history of runny nose, postnasal drip, popping and pressure in both ears, and nasal congestion.  He is not getting much symptom relief with Xyzal.  Outpatient Medications Prior to Visit  Medication Sig Dispense Refill  . candesartan-hydrochlorothiazide (ATACAND HCT) 32-12.5 MG tablet Take 1 tablet by mouth daily. 90 tablet 0  . Cholecalciferol 50 MCG (2000 UT) TABS Take 2 tablets (4,000 Units total) by mouth daily. 180 tablet 1  . famotidine (PEPCID) 40 MG tablet Take 1 tablet (40 mg total) by mouth daily. 30 tablet 2  . ferrous sulfate 325 (65 FE) MG tablet Take 325 mg by mouth daily with breakfast.    . ibuprofen (ADVIL,MOTRIN) 600 MG tablet Take 1 tablet (600 mg total) by mouth every 8 (eight) hours as needed. 60 tablet 0  . INS SYRINGE/NEEDLE 1CC/28G 28G X 1/2" 1 ML MISC Use to inject b12 monthly 10 each 0  . Multiple Vitamin (MULTIVITAMIN) tablet Take 1 tablet by mouth daily.    Marland Kitchen omeprazole (PRILOSEC) 40 MG capsule Take 1 capsule (40 mg total) by mouth daily. 90 capsule 3  . Oxycodone HCl 10 MG TABS Take 1 tablet by mouth 5 (five) times daily.  0  . polyethylene glycol-electrolytes (NULYTELY/GOLYTELY) 420 g solution Take 4,000 mLs by mouth once for 1 dose. 4000 mL 0  . thiamine (VITAMIN B-1) 100 MG tablet Take 1 tablet (100 mg total) by mouth daily. 90 tablet 1  . traZODone (DESYREL) 100 MG tablet TAKE 1 TABLET(100 MG) BY MOUTH AT BEDTIME AS NEEDED FOR SLEEP 90 tablet 1  . zinc gluconate 50 MG tablet Take 1 tablet (50 mg total) by mouth daily. 90 tablet 1   Facility-Administered Medications Prior to Visit  Medication Dose Route Frequency Provider Last Rate Last Dose  . cyanocobalamin ((VITAMIN B-12)) injection 1,000 mcg  1,000 mcg Intramuscular Q30 days [name], MD    1,000 mcg at 04/16/18 1423    ROS Review of Systems  Constitutional: Positive for fatigue. Negative for diaphoresis.  HENT: Positive for congestion, ear pain, postnasal drip and rhinorrhea. Negative for ear discharge, facial swelling, hearing loss, nosebleeds, sinus pressure, sinus pain, sore throat and tinnitus.   Eyes: Negative for visual disturbance.  Respiratory: Negative for cough, shortness of breath and wheezing.   Cardiovascular: Negative for chest pain and palpitations.  Gastrointestinal: Negative for abdominal pain, blood in stool, diarrhea, nausea and vomiting.  Endocrine: Negative.   Genitourinary: Negative.   Musculoskeletal: Negative.   Skin: Negative.  Negative for color change.  Neurological: Negative for dizziness, weakness, light-headedness and numbness.  Hematological: Negative for adenopathy. Does not bruise/bleed easily.  Psychiatric/Behavioral: Negative.     Objective:  BP 110/70 (BP Location: Left Arm, Patient Position: Sitting, Cuff Size: Normal)   Pulse 72   Temp (!) 97.4 F (36.3 C) (Oral)   Ht 5' 8"  (1.727 m)   Wt 175 lb (79.4 kg)   SpO2 99%   BMI 26.61 kg/m   BP Readings from Last 3 Encounters:  05/28/18 110/70  05/03/18 116/78  04/17/18 (!) 145/86    Wt Readings from Last 3 Encounters:  05/28/18 175 lb (79.4 kg)  05/28/18 175 lb 3.2 oz (79.5 kg)  05/03/18 195 lb (88.5 kg)    Physical Exam  Vitals signs reviewed.  Constitutional:      General: He is not in acute distress.    Appearance: He is not ill-appearing, toxic-appearing or diaphoretic.  HENT:     Right Ear: Hearing, tympanic membrane, ear canal and external ear normal. No middle ear effusion. Tympanic membrane is not injected.     Left Ear: Hearing, tympanic membrane, ear canal and external ear normal.  No middle ear effusion. Tympanic membrane is not injected.     Nose: Mucosal edema and rhinorrhea present. No congestion.     Right Nostril: No epistaxis.     Left Nostril: No  epistaxis.     Right Sinus: No maxillary sinus tenderness or frontal sinus tenderness.     Left Sinus: No maxillary sinus tenderness or frontal sinus tenderness.     Mouth/Throat:     Mouth: Mucous membranes are moist.     Pharynx: Oropharynx is clear. No oropharyngeal exudate or posterior oropharyngeal erythema.  Eyes:     General: No scleral icterus.    Conjunctiva/sclera: Conjunctivae normal.  Neck:     Musculoskeletal: Normal range of motion and neck supple. No neck rigidity.  Cardiovascular:     Rate and Rhythm: Normal rate and regular rhythm.     Heart sounds: No murmur.  Pulmonary:     Effort: Pulmonary effort is normal.     Breath sounds: No stridor. No wheezing, rhonchi or rales.  Abdominal:     General: Abdomen is flat. Bowel sounds are normal.     Palpations: There is no hepatomegaly, splenomegaly or mass.     Tenderness: There is no abdominal tenderness.  Musculoskeletal: Normal range of motion.        General: No swelling.     Right lower leg: No edema.     Left lower leg: No edema.  Lymphadenopathy:     Cervical: No cervical adenopathy.  Skin:    General: Skin is warm and dry.     Coloration: Skin is not pale.     Lab Results  Component Value Date   WBC 5.4 05/28/2018   HGB 12.7 (L) 05/28/2018   HCT 39.9 05/28/2018   PLT 241.0 05/28/2018   GLUCOSE 96 05/28/2018   CHOL 118 10/25/2017   TRIG 52.0 10/25/2017   HDL 47.90 10/25/2017   LDLCALC 60 10/25/2017   ALT 20 04/16/2018   AST 23 04/16/2018   NA 138 05/28/2018   K 4.2 05/28/2018   CL 106 05/28/2018   CREATININE 0.98 05/28/2018   BUN 13 05/28/2018   CO2 26 05/28/2018   TSH 1.92 04/24/2017   HGBA1C 5.7 10/25/2017    US Liver Doppler  Result Date: 04/25/2018 CLINICAL DATA:  48 year old male with liver disease EXAM: DUPLEX ULTRASOUND OF LIVER TECHNIQUE: Color and duplex Doppler ultrasound was performed to evaluate the hepatic in-flow and out-flow vessels. COMPARISON:  None. FINDINGS: Portal Vein  Velocities Main:  39 cm/sec Right:  29 cm/sec Left:  18 cm/sec Hepatic Vein Velocities Right:  26 cm/sec Middle:  19 cm/sec Left:  15 cm/sec Hepatic Artery Velocity:  77 cm/sec Splenic Vein Velocity:  28 cm/sec Varices: Absent Ascites: Absent Spleen volume 305 cubic cm No nodular changes of the liver surface IMPRESSION: Unremarkable directed duplex of the hepatic vasculature. Electronically Signed   By: Corrie Mckusick D.O.   On: 04/25/2018 14:24    Assessment & Plan:   Arik was seen today for hypertension, anemia and allergic rhinitis .  Diagnoses and all orders for this  visit:  Allergic rhinitis, unspecified seasonality, unspecified trigger- He is having a flare of his symptoms and has eustachian tube dysfunction.  I have asked him to continue taking Xyzal but I also think he would benefit from a methylprednisolone injection and using a steroid nasal spray. -     methylPREDNISolone acetate (DEPO-MEDROL) injection 120 mg -     triamcinolone (NASACORT) 55 MCG/ACT AERO nasal inhaler; Place 4 sprays into the nose daily.  Dysfunction of both eustachian tubes- See above -     methylPREDNISolone acetate (DEPO-MEDROL) injection 120 mg -     triamcinolone (NASACORT) 55 MCG/ACT AERO nasal inhaler; Place 4 sprays into the nose daily.  Vitamin B12 deficiency anemia due to intrinsic factor deficiency-he will continue monthly B12 injections. -     CBC with Differential/Platelet; Future  Other iron deficiency anemia- Marked improvement noted status post iron infusion.  He is still undergoing a GI work-up. -     CBC with Differential/Platelet; Future -     IBC panel; Future -     Ferritin; Future  Essential hypertension- His blood pressure is well controlled.  Electrolytes and renal function are normal.  Will continue the current meds. -     Basic metabolic panel; Future   I am having Jaishawn Witzke. [place] start on triamcinolone. I am also having him maintain his multivitamin, Oxycodone HCl,  candesartan-hydrochlorothiazide, ibuprofen, Cholecalciferol, zinc gluconate, thiamine, ferrous sulfate, famotidine, INS SYRINGE/NEEDLE 1CC/28G, omeprazole, traZODone, and polyethylene glycol-electrolytes. We administered methylPREDNISolone acetate. We will continue to administer cyanocobalamin.  Meds ordered this encounter  Medications  . methylPREDNISolone acetate (DEPO-MEDROL) injection 120 mg  . triamcinolone (NASACORT) 55 MCG/ACT AERO nasal inhaler    Sig: Place 4 sprays into the nose daily.    Dispense:  32.4 mL    Refill:  3     Follow-up: Return in about 3 months (around 08/28/2018).  [name], MD

## 2018-05-28 NOTE — Patient Instructions (Signed)

## 2018-05-28 NOTE — Progress Notes (Signed)
Denies allergies to eggs or soy products. Denies complication of anesthesia or sedation. Denies use of weight loss medication. Denies use of O2.   Emmi instructions declined.    Patient verbalizes understanding of prep instructions.

## 2018-05-30 ENCOUNTER — Telehealth: Payer: Self-pay | Admitting: *Deleted

## 2018-05-30 NOTE — Telephone Encounter (Signed)
Called and spoke with patient to cancel procedure appointment. We will call back to reschedule when schedules are opened.

## 2018-05-31 ENCOUNTER — Encounter: Payer: BLUE CROSS/BLUE SHIELD | Admitting: Gastroenterology

## 2018-06-06 ENCOUNTER — Encounter: Payer: BLUE CROSS/BLUE SHIELD | Admitting: Gastroenterology

## 2018-07-01 ENCOUNTER — Ambulatory Visit: Payer: BLUE CROSS/BLUE SHIELD | Admitting: Internal Medicine

## 2018-07-10 ENCOUNTER — Telehealth: Payer: Self-pay

## 2018-07-10 ENCOUNTER — Other Ambulatory Visit: Payer: Self-pay | Admitting: Internal Medicine

## 2018-07-10 DIAGNOSIS — I10 Essential (primary) hypertension: Secondary | ICD-10-CM

## 2018-07-10 MED ORDER — CANDESARTAN CILEXETIL-HCTZ 32-12.5 MG PO TABS: 1.0000 | ORAL_TABLET | Freq: Every day | ORAL | 1 refills | Status: DC

## 2018-07-10 NOTE — Telephone Encounter (Signed)
Called patient to reschedule missed appointment on 07/01/18-patient declined to reschedule at this time he stated he is waiting to hear back from other doctors first

## 2018-07-11 ENCOUNTER — Telehealth: Payer: Self-pay | Admitting: *Deleted

## 2018-07-11 NOTE — Telephone Encounter (Signed)
Pt states he needs a June appointment.  Will call back when June schedule is available.

## 2018-07-15 NOTE — Telephone Encounter (Signed)
Spoke with pt and colonoscopy scheduled for 08-27-18 at 9:30 a.m.  Pt states he has prep at home.  New instructions sent to him via MyChart.

## 2018-07-30 ENCOUNTER — Other Ambulatory Visit: Payer: Self-pay | Admitting: Internal Medicine

## 2018-07-30 DIAGNOSIS — D510 Vitamin B12 deficiency anemia due to intrinsic factor deficiency: Secondary | ICD-10-CM

## 2018-07-30 MED ORDER — CYANOCOBALAMIN 1000 MCG/ML IJ SOLN: 1000.0000 ug | Freq: Once | INTRAMUSCULAR | 3 refills | Status: AC

## 2018-08-10 ENCOUNTER — Other Ambulatory Visit: Payer: Self-pay | Admitting: Internal Medicine

## 2018-08-10 DIAGNOSIS — G4701 Insomnia due to medical condition: Secondary | ICD-10-CM

## 2018-08-10 DIAGNOSIS — G8929 Other chronic pain: Secondary | ICD-10-CM

## 2018-08-20 DIAGNOSIS — G894 Chronic pain syndrome: Secondary | ICD-10-CM | POA: Diagnosis not present

## 2018-08-27 ENCOUNTER — Encounter: Payer: BLUE CROSS/BLUE SHIELD | Admitting: Gastroenterology

## 2018-09-26 DIAGNOSIS — N529 Male erectile dysfunction, unspecified: Secondary | ICD-10-CM | POA: Diagnosis not present

## 2018-09-26 DIAGNOSIS — Z6827 Body mass index (BMI) 27.0-27.9, adult: Secondary | ICD-10-CM | POA: Diagnosis not present

## 2018-09-26 DIAGNOSIS — N4830 Priapism, unspecified: Secondary | ICD-10-CM | POA: Diagnosis not present

## 2018-10-02 DIAGNOSIS — M9983 Other biomechanical lesions of lumbar region: Secondary | ICD-10-CM | POA: Diagnosis not present

## 2018-10-02 DIAGNOSIS — M5416 Radiculopathy, lumbar region: Secondary | ICD-10-CM | POA: Diagnosis not present

## 2018-10-03 ENCOUNTER — Ambulatory Visit: Payer: Self-pay

## 2018-10-03 ENCOUNTER — Other Ambulatory Visit: Payer: Self-pay

## 2018-10-03 ENCOUNTER — Ambulatory Visit (INDEPENDENT_AMBULATORY_CARE_PROVIDER_SITE_OTHER): Payer: BC Managed Care – PPO | Admitting: Family Medicine

## 2018-10-03 ENCOUNTER — Encounter: Payer: Self-pay | Admitting: Family Medicine

## 2018-10-03 VITALS — BP 109/75 | HR 67 | Ht 70.0 in | Wt 185.0 lb

## 2018-10-03 DIAGNOSIS — M25511 Pain in right shoulder: Secondary | ICD-10-CM

## 2018-10-03 DIAGNOSIS — M12819 Other specific arthropathies, not elsewhere classified, unspecified shoulder: Secondary | ICD-10-CM | POA: Insufficient documentation

## 2018-10-03 DIAGNOSIS — M75101 Unspecified rotator cuff tear or rupture of right shoulder, not specified as traumatic: Secondary | ICD-10-CM

## 2018-10-03 DIAGNOSIS — M12811 Other specific arthropathies, not elsewhere classified, right shoulder: Secondary | ICD-10-CM

## 2018-10-03 DIAGNOSIS — M75100 Unspecified rotator cuff tear or rupture of unspecified shoulder, not specified as traumatic: Secondary | ICD-10-CM | POA: Insufficient documentation

## 2018-10-03 MED ORDER — METHYLPREDNISOLONE ACETATE 40 MG/ML IJ SUSP
40.0000 mg | Freq: Once | INTRAMUSCULAR | Status: AC
  Administered 2018-10-03: 40 mg

## 2018-10-03 NOTE — Progress Notes (Signed)
Bradley [name] - 48 y.o. male MRN [id number]  Date of birth: [birth year]  SUBJECTIVE:  Including CC & ROS.  Chief Complaint  Patient presents with  . Shoulder Pain    right shoulder    Bradley [name] is a 49 y.o. male that is presenting with acute on chronic right shoulder pain.  He has received steroid injections in the past and that improved his symptoms.  He has good range of motion but his strength is a problem at times.  He has pain with flexion and abduction.  The pain is intermittent in nature.  Is moderate to severe.  Is occurred for the past 2 years.  Denies a specific inciting event or trauma.  It is localized to the shoulder.   Review of Systems  Constitutional: Negative for fever.  HENT: Negative for congestion.   Respiratory: Negative for cough.   Cardiovascular: Negative for chest pain.  Gastrointestinal: Negative for abdominal pain.  Musculoskeletal: Positive for arthralgias and joint swelling.  Skin: Negative for color change.  Neurological: Negative for weakness.  Hematological: Negative for adenopathy.    HISTORY: Past Medical, Surgical, Social, and Family History Reviewed & Updated per EMR.   Pertinent Historical Findings include:  Past Medical History:  Diagnosis Date  . Allergy   . Anemia    since gastric bypass  . Anxiety   . Aortic valve sclerosis   . Arthritis   . Arthropathy, unspecified, other specified sites   . CAD (coronary artery disease)   . Contact with or exposure to venereal diseases   . Depression   . Displacement of intervertebral disc, site unspecified, without myelopathy   . GERD (gastroesophageal reflux disease)   . Heart murmur    " slight, [place] said nothing to worry about."  . History of kidney stones   . Hypertension     " no longer have hypertension since gastric bypass "  . Lumbago   . Obesity, unspecified   . Other, mixed, or unspecified nondependent drug abuse, unspecified   . Personal history of urinary calculi   .  Pneumonia   . Priapism   . Stenosis, spinal, lumbar     Past Surgical History:  Procedure Laterality Date  . ABDOMINAL SURGERY    . ANKLE SURGERY    . ANTERIOR LAT LUMBAR FUSION Left 02/24/2016   Procedure: LEFT LUMBAR TWO-THREE  ANTERIOR LATERAL LUMBAR FUSION  ;  Surgeon: Earnie Larsson, MD;  Location: [place];  Service: Neurosurgery;  Laterality: Left;  . BACK SURGERY    . GASTRIC BYPASS    . GASTRIC BYPASS  2008  . HERNIA REPAIR    . INCISION AND DRAINAGE ABSCESS Right 07/06/2004   great toe  . IRRIGATION AND DEBRIDEMENT ABSCESS Right 07/08/2004   great toe  . KNEE ARTHROPLASTY Right 04/26/2017   Procedure: RIGHT TOTAL KNEE ARTHROPLASTY WITH COMPUTER NAVIGATION;  Surgeon: Rod Can, MD;  Location: [facility];  Service: Orthopedics;  Laterality: Right;  Needs RNFA  . KNEE SURGERY    . LUMBAR PERCUTANEOUS PEDICLE SCREW 1 LEVEL Left 02/24/2016   Procedure: LUMBAR PERCUTANEOUS PEDICLE SCREW 1 LEVEL;  Surgeon: Earnie Larsson, MD;  Location: [place];  Service: Neurosurgery;  Laterality: Left;  Marland Kitchen MICRODISCECTOMY LUMBAR Left 08/24/2008   T12 - L1  . panectomy    . TOTAL KNEE ARTHROPLASTY Right 04/2017    Allergies  Allergen Reactions  . Codeine Hives    Hives and itching    Family History  Problem Relation  Age of Onset  . Arthritis Mother   . Colon cancer Mother   . Breast cancer Mother        twice  . Kidney failure Mother   . Heart disease Father   . Hypertension Father   . Esophageal cancer Neg Hx   . Inflammatory bowel disease Neg Hx   . Liver disease Neg Hx   . Pancreatic cancer Neg Hx   . Stomach cancer Neg Hx   . Rectal cancer Neg Hx      Social History   Socioeconomic History  . Marital status: Married    Spouse name: Not on file  . Number of children: 3  . Years of education: Not on file  . Highest education level: Not on file  Occupational History  . Occupation: disabled  Social Needs  . Financial resource strain: Not on file  . Food insecurity    Worry: Not on  file    Inability: Not on file  . Transportation needs    Medical: Not on file    Non-medical: Not on file  Tobacco Use  . Smoking status: Never Smoker  . Smokeless tobacco: Never Used  Substance and Sexual Activity  . Alcohol use: No  . Drug use: No  . Sexual activity: Yes  Lifestyle  . Physical activity    Days per week: Not on file    Minutes per session: Not on file  . Stress: Not on file  Relationships  . Social Herbalist on phone: Not on file    Gets together: Not on file    Attends religious service: Not on file    Active member of club or organization: Not on file    Attends meetings of clubs or organizations: Not on file    Relationship status: Not on file  . Intimate partner violence    Fear of current or ex partner: Not on file    Emotionally abused: Not on file    Physically abused: Not on file    Forced sexual activity: Not on file  Other Topics Concern  . Not on file  Social History Narrative  . Not on file     PHYSICAL EXAM:  VS: BP 109/75   Pulse 67   Ht 5' 10"  (1.778 m)   Wt 185 lb (83.9 kg)   BMI 26.54 kg/m  Physical Exam Gen: NAD, alert, cooperative with exam, well-appearing ENT: normal lips, normal nasal mucosa,  Eye: normal EOM, normal conjunctiva and lids CV:  no edema, +2 pedal pulses   Resp: no accessory muscle use, non-labored,   Skin: no rashes, no areas of induration  Neuro: normal tone, normal sensation to touch Psych:  normal insight, alert and oriented MSK:  Right shoulder: Normal active flexion and abduction. Normal external rotation. Normal strength resistance with internal and external rotation. Positive empty can test. Positive Hawkins test. Neurovascular intact   Aspiration/Injection Procedure Note Bradley [name] 1970/11/09  Procedure: Injection Indications: Right shoulder pain  Procedure Details Consent: Risks of procedure as well as the alternatives and risks of each were explained to the  (patient/caregiver).  Consent for procedure obtained. Time Out: Verified patient identification, verified procedure, site/side was marked, verified correct patient position, special equipment/implants available, medications/allergies/relevent history reviewed, required imaging and test results available.  Performed.  The area was cleaned with iodine and alcohol swabs.    The right subacromial space was injected using 1 cc's of 40 mg Depo-Medrol and 4  cc's of 0.25% bupivacaine with a 22 1 1/2" needle.  Ultrasound was used. Images were obtained in long views showing the injection.     A sterile dressing was applied.  Patient did tolerate procedure well.     ASSESSMENT & PLAN:   Rotator cuff tear arthropathy Acute on chronic in nature.  Has good range of motion. -Subacromial injection. -Counseled on home exercise therapy and supportive care. -Could consider AC joint injection or glenohumeral injection if minor improvement.

## 2018-10-03 NOTE — Assessment & Plan Note (Signed)
Acute on chronic in nature.  Has good range of motion. -Subacromial injection. -Counseled on home exercise therapy and supportive care. -Could consider AC joint injection or glenohumeral injection if minor improvement.

## 2018-10-03 NOTE — Patient Instructions (Signed)
Good to see you Please try ice  Please try to avoid maneuvers that exacerbate your pain   Please send me a message in MyChart with any questions or updates.  Please see me back as needed.   --Dr. Raeford Razor

## 2018-10-31 DIAGNOSIS — M9983 Other biomechanical lesions of lumbar region: Secondary | ICD-10-CM | POA: Diagnosis not present

## 2018-10-31 DIAGNOSIS — Z6827 Body mass index (BMI) 27.0-27.9, adult: Secondary | ICD-10-CM | POA: Diagnosis not present

## 2018-10-31 DIAGNOSIS — R030 Elevated blood-pressure reading, without diagnosis of hypertension: Secondary | ICD-10-CM | POA: Diagnosis not present

## 2018-11-07 DIAGNOSIS — M9983 Other biomechanical lesions of lumbar region: Secondary | ICD-10-CM | POA: Diagnosis not present

## 2018-11-19 DIAGNOSIS — M9983 Other biomechanical lesions of lumbar region: Secondary | ICD-10-CM | POA: Diagnosis not present

## 2018-11-19 DIAGNOSIS — M5126 Other intervertebral disc displacement, lumbar region: Secondary | ICD-10-CM | POA: Diagnosis not present

## 2018-11-19 DIAGNOSIS — M47816 Spondylosis without myelopathy or radiculopathy, lumbar region: Secondary | ICD-10-CM | POA: Diagnosis not present

## 2018-12-04 DIAGNOSIS — M9983 Other biomechanical lesions of lumbar region: Secondary | ICD-10-CM | POA: Diagnosis not present

## 2018-12-04 DIAGNOSIS — M4310 Spondylolisthesis, site unspecified: Secondary | ICD-10-CM | POA: Diagnosis not present

## 2018-12-09 ENCOUNTER — Other Ambulatory Visit: Payer: Self-pay | Admitting: Neurosurgery

## 2018-12-19 DIAGNOSIS — M4316 Spondylolisthesis, lumbar region: Secondary | ICD-10-CM | POA: Diagnosis not present

## 2018-12-24 ENCOUNTER — Ambulatory Visit: Payer: Self-pay

## 2018-12-24 ENCOUNTER — Other Ambulatory Visit: Payer: Self-pay

## 2018-12-24 ENCOUNTER — Ambulatory Visit (INDEPENDENT_AMBULATORY_CARE_PROVIDER_SITE_OTHER): Payer: BC Managed Care – PPO | Admitting: Family Medicine

## 2018-12-24 ENCOUNTER — Encounter: Payer: Self-pay | Admitting: Family Medicine

## 2018-12-24 ENCOUNTER — Ambulatory Visit (HOSPITAL_BASED_OUTPATIENT_CLINIC_OR_DEPARTMENT_OTHER)
Admission: RE | Admit: 2018-12-24 | Discharge: 2018-12-24 | Disposition: A | Discharge status: Home / Self Care | Payer: BC Managed Care – PPO | Source: Ambulatory Visit | Attending: Family Medicine | Admitting: Family Medicine

## 2018-12-24 VITALS — BP 167/103 | HR 85 | Ht 70.0 in | Wt 195.0 lb

## 2018-12-24 DIAGNOSIS — M25572 Pain in left ankle and joints of left foot: Secondary | ICD-10-CM

## 2018-12-24 DIAGNOSIS — S86112A Strain of other muscle(s) and tendon(s) of posterior muscle group at lower leg level, left leg, initial encounter: Secondary | ICD-10-CM

## 2018-12-24 DIAGNOSIS — M7989 Other specified soft tissue disorders: Secondary | ICD-10-CM | POA: Diagnosis not present

## 2018-12-24 DIAGNOSIS — M79672 Pain in left foot: Secondary | ICD-10-CM | POA: Diagnosis not present

## 2018-12-24 DIAGNOSIS — S86112D Strain of other muscle(s) and tendon(s) of posterior muscle group at lower leg level, left leg, subsequent encounter: Secondary | ICD-10-CM | POA: Insufficient documentation

## 2018-12-24 DIAGNOSIS — M19072 Primary osteoarthritis, left ankle and foot: Secondary | ICD-10-CM | POA: Diagnosis not present

## 2018-12-24 MED ORDER — PREDNISONE 5 MG PO TABS: ORAL_TABLET | ORAL | 0 refills | Status: DC

## 2018-12-24 NOTE — Progress Notes (Signed)
Bradley [name] - 48 y.o. male MRN [id number]  Date of birth: 08-Dec-1970  SUBJECTIVE:  Including CC & ROS.  Chief Complaint  Patient presents with  . Ankle Pain    left ankle x 12-23-2018    Bradley [name] is a 48 y.o. male that is presenting with acute left ankle pain.  He is unsure how he injured it last night but had significant swelling and pain.  Was unable to move the ankle at all.  He had different areas of swelling throughout the dorsal midfoot.  He is unable to dorsiflex or plantarflex without significant pain.  He has a history of surgery in the distal tibia because of a crush injury about 20 years ago.  Has pain despite being on his chronic pain medications.  Pain is occurring over the ankle joint itself as well as the insertion into the posterior tibialis.  Has some pain throughout the dorsal midfoot as well.  No significant ecchymosis today.  Wrapped with compression that seems to help with some of the swelling.   Review of Systems  Constitutional: Negative for fever.  HENT: Negative for congestion.   Respiratory: Negative for cough.   Cardiovascular: Negative for chest pain.  Musculoskeletal: Positive for arthralgias, back pain, gait problem and joint swelling.  Skin: Negative for color change.  Neurological: Negative for weakness.  Hematological: Negative for adenopathy.    HISTORY: Past Medical, Surgical, Social, and Family History Reviewed & Updated per EMR.   Pertinent Historical Findings include:  Past Medical History:  Diagnosis Date  . Allergy   . Anemia    since gastric bypass  . Anxiety   . Aortic valve sclerosis   . Arthritis   . Arthropathy, unspecified, other specified sites   . CAD (coronary artery disease)   . Contact with or exposure to venereal diseases   . Depression   . Displacement of intervertebral disc, site unspecified, without myelopathy   . GERD (gastroesophageal reflux disease)   . Heart murmur    " slight, [place] said nothing to worry  about."  . History of kidney stones   . Hypertension     " no longer have hypertension since gastric bypass "  . Lumbago   . Obesity, unspecified   . Other, mixed, or unspecified nondependent drug abuse, unspecified   . Personal history of urinary calculi   . Pneumonia   . Priapism   . Stenosis, spinal, lumbar     Past Surgical History:  Procedure Laterality Date  . ABDOMINAL SURGERY    . ANKLE SURGERY    . ANTERIOR LAT LUMBAR FUSION Left 02/24/2016   Procedure: LEFT LUMBAR TWO-THREE  ANTERIOR LATERAL LUMBAR FUSION  ;  Surgeon: Earnie Larsson, MD;  Location: [place];  Service: Neurosurgery;  Laterality: Left;  . BACK SURGERY    . GASTRIC BYPASS    . GASTRIC BYPASS  2008  . HERNIA REPAIR    . INCISION AND DRAINAGE ABSCESS Right 07/06/2004   great toe  . IRRIGATION AND DEBRIDEMENT ABSCESS Right 07/08/2004   great toe  . KNEE ARTHROPLASTY Right 04/26/2017   Procedure: RIGHT TOTAL KNEE ARTHROPLASTY WITH COMPUTER NAVIGATION;  Surgeon: Rod Can, MD;  Location: [facility];  Service: Orthopedics;  Laterality: Right;  Needs RNFA  . KNEE SURGERY    . LUMBAR PERCUTANEOUS PEDICLE SCREW 1 LEVEL Left 02/24/2016   Procedure: LUMBAR PERCUTANEOUS PEDICLE SCREW 1 LEVEL;  Surgeon: Earnie Larsson, MD;  Location: [place];  Service: Neurosurgery;  Laterality:  Left;  . MICRODISCECTOMY LUMBAR Left 08/24/2008   T12 - L1  . panectomy    . TOTAL KNEE ARTHROPLASTY Right 04/2017    Allergies  Allergen Reactions  . Codeine Hives    Hives and itching    Family History  Problem Relation Age of Onset  . Arthritis Mother   . Colon cancer Mother   . Breast cancer Mother        twice  . Kidney failure Mother   . Heart disease Father   . Hypertension Father   . Esophageal cancer Neg Hx   . Inflammatory bowel disease Neg Hx   . Liver disease Neg Hx   . Pancreatic cancer Neg Hx   . Stomach cancer Neg Hx   . Rectal cancer Neg Hx      Social History   Socioeconomic History  . Marital status: Married     Spouse name: Not on file  . Number of children: 3  . Years of education: Not on file  . Highest education level: Not on file  Occupational History  . Occupation: disabled  Social Needs  . Financial resource strain: Not on file  . Food insecurity    Worry: Not on file    Inability: Not on file  . Transportation needs    Medical: Not on file    Non-medical: Not on file  Tobacco Use  . Smoking status: Never Smoker  . Smokeless tobacco: Never Used  Substance and Sexual Activity  . Alcohol use: No  . Drug use: No  . Sexual activity: Yes  Lifestyle  . Physical activity    Days per week: Not on file    Minutes per session: Not on file  . Stress: Not on file  Relationships  . Social Herbalist on phone: Not on file    Gets together: Not on file    Attends religious service: Not on file    Active member of club or organization: Not on file    Attends meetings of clubs or organizations: Not on file    Relationship status: Not on file  . Intimate partner violence    Fear of current or ex partner: Not on file    Emotionally abused: Not on file    Physically abused: Not on file    Forced sexual activity: Not on file  Other Topics Concern  . Not on file  Social History Narrative  . Not on file     PHYSICAL EXAM:  VS: BP (!) 167/103   Pulse 85   Ht 5' 10"  (1.778 m)   Wt 195 lb (88.5 kg)   BMI 27.98 kg/m  Physical Exam Gen: NAD, alert, cooperative with exam, well-appearing ENT: normal lips, normal nasal mucosa,  Eye: normal EOM, normal conjunctiva and lids CV:  no edema, +2 pedal pulses   Resp: no accessory muscle use, non-labored,  Skin: no rashes, no areas of induration  Neuro: normal tone, normal sensation to touch Psych:  normal insight, alert and oriented MSK:  Left ankle/foot: Swelling over the lateral anterior ankle joint. Limited passive dorsiflexion and plantarflexion. Plantarflexion with [place] Silos test. No significant ecchymosis. Does have  swelling over the dorsal midfoot as well. Some weakness with resistance to inversion as well as eversion. No significant translation with anterior drawer. Neurovascularly intact  Limited ultrasound: Left ankle/foot:  Moderate to severe effusion within the ankle joint anteriorly. Different areas of effusion noted in the dorsal midfoot related to degenerative changes.  No fracture of the distal fibula. Normal-appearing peroneal tendons. Normal insertion into the base of the fifth metatarsal. Effusion noted in the posterior tibialis as it courses inferior to the medial malleolus.  There is continued effusion within the tendon and appears to be a retraction of about a centimeter from the insertion.  Appears to be a partial tear of the posterior tibialis  Summary: Effusion of the ankle joint.  Degenerative changes and effusions of the different midfoot joints.  Appears to be a partial tear of the posterior tibialis  Ultrasound and interpretation by Clearance Coots, MD      ASSESSMENT & PLAN:   Posterior tibial tendon tear, traumatic, left, initial encounter There appears to be a partial tear of the posterior tibialis.  This appears to be acute based on the amount of effusion within the tendon sheath.  Appears to be about a centimeter retraction. -Placed in cam walker today. -We will follow-up in 1 week.  If still significant may need to consider MRI.  Acute left ankle pain Appears to have different areas of effusions exacerbating underlying degenerative changes in the ankle joint as well as in the midfoot area.  No fracture appreciated. -X-rays. -Cam walker.   -Prednisone.

## 2018-12-24 NOTE — Patient Instructions (Signed)
Good to see you Please use ice and elevation Please use the CAM walker anytime that you're walking  I will call you with the results from today   Please send me a message in MyChart with any questions or updates.  Please see me back in 1 week.   --Dr. Raeford Razor

## 2018-12-24 NOTE — Assessment & Plan Note (Signed)
Appears to have different areas of effusions exacerbating underlying degenerative changes in the ankle joint as well as in the midfoot area.  No fracture appreciated. -X-rays. -Cam walker.   -Prednisone.

## 2018-12-24 NOTE — Assessment & Plan Note (Signed)
There appears to be a partial tear of the posterior tibialis.  This appears to be acute based on the amount of effusion within the tendon sheath.  Appears to be about a centimeter retraction. -Placed in cam walker today. -We will follow-up in 1 week.  If still significant may need to consider MRI.

## 2018-12-25 ENCOUNTER — Telehealth: Payer: Self-pay | Admitting: Family Medicine

## 2018-12-25 NOTE — Telephone Encounter (Signed)
Spoke with patient about xray results.   Rosemarie Ax, MD [facility] 12/25/2018, 11:21 AM

## 2018-12-31 ENCOUNTER — Other Ambulatory Visit: Payer: Self-pay

## 2018-12-31 ENCOUNTER — Ambulatory Visit: Payer: Self-pay

## 2018-12-31 ENCOUNTER — Encounter: Payer: Self-pay | Admitting: Family Medicine

## 2018-12-31 ENCOUNTER — Ambulatory Visit (INDEPENDENT_AMBULATORY_CARE_PROVIDER_SITE_OTHER): Payer: BC Managed Care – PPO | Admitting: Family Medicine

## 2018-12-31 VITALS — BP 126/86 | HR 76 | Ht 70.0 in | Wt 195.0 lb

## 2018-12-31 DIAGNOSIS — S86112D Strain of other muscle(s) and tendon(s) of posterior muscle group at lower leg level, left leg, subsequent encounter: Secondary | ICD-10-CM

## 2018-12-31 MED ORDER — NITROGLYCERIN 0.2 MG/HR TD PT24: MEDICATED_PATCH | TRANSDERMAL | 11 refills | Status: DC

## 2018-12-31 NOTE — Assessment & Plan Note (Signed)
Partial tear of the posterior tibialis may be chronic with an acute exacerbation. -Try nitro. -Counseled on home exercise therapy and supportive care. -Counseled on the cam walker and when to use it. -May need to consider physical therapy or MRI.

## 2018-12-31 NOTE — Patient Instructions (Signed)
Good to see you Please try the nitro. It may give you a headache but this should go away with time. Please let me know if it doesn't.  Please use the boot if you are walking a long distance, walking on even ground or going up or down stairs.  Please try the exercises.   Please send me a message in MyChart with any questions or updates.  Please see me back in 4 weeks.   --Dr. Raeford Razor  Nitroglycerin Protocol   Apply 1/4 nitroglycerin patch to affected area daily.  Change position of patch within the affected area every 24 hours.  You may experience a headache during the first 1-2 weeks of using the patch, these should subside.  If you experience headaches after beginning nitroglycerin patch treatment, you may take your preferred over the counter pain reliever.  Another side effect of the nitroglycerin patch is skin irritation or rash related to patch adhesive.  Please notify our office if you develop more severe headaches or rash, and stop the patch.  Tendon healing with nitroglycerin patch may require 12 to 24 weeks depending on the extent of injury.  Men should not use if taking Viagra, Cialis, or Levitra.   Do not use if you have migraines or rosacea.

## 2018-12-31 NOTE — Progress Notes (Signed)
Bradley [name] - 48 y.o. male MRN [id number]  Date of birth: 1970/03/27  SUBJECTIVE:  Including CC & ROS.  Chief Complaint  Patient presents with  . Follow-up    follow up for left ankle    Bradley [name] is a 47 y.o. male that is following up for his left ankle and foot pain.  He has been in the cam walker for a week and notices improvement of his pain.  He did notice exacerbation of his pain when he went up and down the stairs without the cam walker on.  The pain is occurring at the insertion of the posterior tibialis.  Denies any numbness or tingling.  Has better range of motion.  Pain is moderate and intermittent.  Independent review of the left ankle x-ray from 10/21 shows no acute abnormality.  Independent review of the left foot x-ray from 10/21 shows a possible avulsion fracture of the navicular.   Review of Systems  Constitutional: Negative for fever.  HENT: Negative for congestion.   Respiratory: Negative for cough.   Cardiovascular: Negative for chest pain.  Gastrointestinal: Negative for abdominal pain.  Musculoskeletal: Positive for arthralgias, back pain and gait problem.  Skin: Negative for color change.  Neurological: Negative for weakness.  Hematological: Negative for adenopathy.    HISTORY: Past Medical, Surgical, Social, and Family History Reviewed & Updated per EMR.   Pertinent Historical Findings include:  Past Medical History:  Diagnosis Date  . Allergy   . Anemia    since gastric bypass  . Anxiety   . Aortic valve sclerosis   . Arthritis   . Arthropathy, unspecified, other specified sites   . CAD (coronary artery disease)   . Contact with or exposure to venereal diseases   . Depression   . Displacement of intervertebral disc, site unspecified, without myelopathy   . GERD (gastroesophageal reflux disease)   . Heart murmur    " slight, [place] said nothing to worry about."  . History of kidney stones   . Hypertension     " no longer have  hypertension since gastric bypass "  . Lumbago   . Obesity, unspecified   . Other, mixed, or unspecified nondependent drug abuse, unspecified   . Personal history of urinary calculi   . Pneumonia   . Priapism   . Stenosis, spinal, lumbar     Past Surgical History:  Procedure Laterality Date  . ABDOMINAL SURGERY    . ANKLE SURGERY    . ANTERIOR LAT LUMBAR FUSION Left 02/24/2016   Procedure: LEFT LUMBAR TWO-THREE  ANTERIOR LATERAL LUMBAR FUSION  ;  Surgeon: Earnie Larsson, MD;  Location: [place];  Service: Neurosurgery;  Laterality: Left;  . BACK SURGERY    . GASTRIC BYPASS    . GASTRIC BYPASS  2008  . HERNIA REPAIR    . INCISION AND DRAINAGE ABSCESS Right 07/06/2004   great toe  . IRRIGATION AND DEBRIDEMENT ABSCESS Right 07/08/2004   great toe  . KNEE ARTHROPLASTY Right 04/26/2017   Procedure: RIGHT TOTAL KNEE ARTHROPLASTY WITH COMPUTER NAVIGATION;  Surgeon: Rod Can, MD;  Location: [facility];  Service: Orthopedics;  Laterality: Right;  Needs RNFA  . KNEE SURGERY    . LUMBAR PERCUTANEOUS PEDICLE SCREW 1 LEVEL Left 02/24/2016   Procedure: LUMBAR PERCUTANEOUS PEDICLE SCREW 1 LEVEL;  Surgeon: Earnie Larsson, MD;  Location: [place];  Service: Neurosurgery;  Laterality: Left;  Marland Kitchen MICRODISCECTOMY LUMBAR Left 08/24/2008   T12 - L1  . panectomy    .  TOTAL KNEE ARTHROPLASTY Right 04/2017    Allergies  Allergen Reactions  . Codeine Hives    Hives and itching    Family History  Problem Relation Age of Onset  . Arthritis Mother   . Colon cancer Mother   . Breast cancer Mother        twice  . Kidney failure Mother   . Heart disease Father   . Hypertension Father   . Esophageal cancer Neg Hx   . Inflammatory bowel disease Neg Hx   . Liver disease Neg Hx   . Pancreatic cancer Neg Hx   . Stomach cancer Neg Hx   . Rectal cancer Neg Hx      Social History   Socioeconomic History  . Marital status: Married    Spouse name: Not on file  . Number of children: 3  . Years of education: Not  on file  . Highest education level: Not on file  Occupational History  . Occupation: disabled  Social Needs  . Financial resource strain: Not on file  . Food insecurity    Worry: Not on file    Inability: Not on file  . Transportation needs    Medical: Not on file    Non-medical: Not on file  Tobacco Use  . Smoking status: Never Smoker  . Smokeless tobacco: Never Used  Substance and Sexual Activity  . Alcohol use: No  . Drug use: No  . Sexual activity: Yes  Lifestyle  . Physical activity    Days per week: Not on file    Minutes per session: Not on file  . Stress: Not on file  Relationships  . Social Herbalist on phone: Not on file    Gets together: Not on file    Attends religious service: Not on file    Active member of club or organization: Not on file    Attends meetings of clubs or organizations: Not on file    Relationship status: Not on file  . Intimate partner violence    Fear of current or ex partner: Not on file    Emotionally abused: Not on file    Physically abused: Not on file    Forced sexual activity: Not on file  Other Topics Concern  . Not on file  Social History Narrative  . Not on file     PHYSICAL EXAM:  VS: BP 126/86   Pulse 76   Ht 5' 10"  (1.778 m)   Wt 195 lb (88.5 kg)   BMI 27.98 kg/m  Physical Exam Gen: NAD, alert, cooperative with exam, well-appearing ENT: normal lips, normal nasal mucosa,  Eye: normal EOM, normal conjunctiva and lids CV:  no edema, +2 pedal pulses   Resp: no accessory muscle use, non-labored,  Skin: no rashes, no areas of induration  Neuro: normal tone, normal sensation to touch Psych:  normal insight, alert and oriented MSK:  Left ankle: Improved ecchymosis and swelling. Better ankle range of motion. Tenderness to palpation at the insertion of the posterior tib. Pain with resistance to inversion. Able to rise up on tiptoes but has pain. Neurovascularly intact  Limited ultrasound: Left  foot/ankle:  Still apparent partial tear of the posterior tibialis.  This may be chronic with exacerbation of his most recent injury.  There appears to be about a 1.36 cm retraction.  There is a effusion near the insertion.  Summary: Findings suggestive of a partial tear of the posterior tibialis  Ultrasound and  interpretation by Clearance Coots, MD      ASSESSMENT & PLAN:   Posterior tibial tendon tear, traumatic, left, subsequent encounter Partial tear of the posterior tibialis may be chronic with an acute exacerbation. -Try nitro. -Counseled on home exercise therapy and supportive care. -Counseled on the cam walker and when to use it. -May need to consider physical therapy or MRI.

## 2019-01-03 DIAGNOSIS — Z5181 Encounter for therapeutic drug level monitoring: Secondary | ICD-10-CM | POA: Diagnosis not present

## 2019-01-03 DIAGNOSIS — Z79899 Other long term (current) drug therapy: Secondary | ICD-10-CM | POA: Diagnosis not present

## 2019-01-14 ENCOUNTER — Other Ambulatory Visit: Payer: Self-pay | Admitting: Internal Medicine

## 2019-01-14 DIAGNOSIS — D510 Vitamin B12 deficiency anemia due to intrinsic factor deficiency: Secondary | ICD-10-CM

## 2019-01-14 MED ORDER — "INSULIN SYRINGE/NEEDLE 28G X 1/2"" 1 ML MISC": 1 refills | Status: DC

## 2019-01-21 ENCOUNTER — Inpatient Hospital Stay: Admit: 2019-01-21 | Payer: BC Managed Care – PPO | Admitting: Neurosurgery

## 2019-01-21 SURGERY — ANTERIOR LATERAL LUMBAR FUSION WITH PERCUTANEOUS SCREW 1 LEVEL
Anesthesia: General

## 2019-01-27 DIAGNOSIS — R509 Fever, unspecified: Secondary | ICD-10-CM | POA: Diagnosis not present

## 2019-01-27 DIAGNOSIS — R05 Cough: Secondary | ICD-10-CM | POA: Diagnosis not present

## 2019-01-27 DIAGNOSIS — Z20828 Contact with and (suspected) exposure to other viral communicable diseases: Secondary | ICD-10-CM | POA: Diagnosis not present

## 2019-01-28 ENCOUNTER — Ambulatory Visit: Payer: BC Managed Care – PPO | Admitting: Family Medicine

## 2019-02-18 DIAGNOSIS — M5416 Radiculopathy, lumbar region: Secondary | ICD-10-CM | POA: Diagnosis not present

## 2019-03-05 DIAGNOSIS — M13842 Other specified arthritis, left hand: Secondary | ICD-10-CM | POA: Diagnosis not present

## 2019-03-05 DIAGNOSIS — M13841 Other specified arthritis, right hand: Secondary | ICD-10-CM | POA: Diagnosis not present

## 2019-03-25 DIAGNOSIS — M5416 Radiculopathy, lumbar region: Secondary | ICD-10-CM | POA: Diagnosis not present

## 2019-04-18 ENCOUNTER — Other Ambulatory Visit: Payer: Self-pay | Admitting: Internal Medicine

## 2019-04-18 DIAGNOSIS — I10 Essential (primary) hypertension: Secondary | ICD-10-CM

## 2019-04-22 ENCOUNTER — Encounter: Payer: Self-pay | Admitting: Internal Medicine

## 2019-04-22 ENCOUNTER — Ambulatory Visit (INDEPENDENT_AMBULATORY_CARE_PROVIDER_SITE_OTHER): Payer: Medicare Other | Admitting: Internal Medicine

## 2019-04-22 ENCOUNTER — Other Ambulatory Visit: Payer: Self-pay

## 2019-04-22 VITALS — BP 156/94 | HR 86 | Temp 98.3°F | Resp 16 | Ht 70.0 in | Wt 205.0 lb

## 2019-04-22 DIAGNOSIS — Z0000 Encounter for general adult medical examination without abnormal findings: Secondary | ICD-10-CM

## 2019-04-22 DIAGNOSIS — E559 Vitamin D deficiency, unspecified: Secondary | ICD-10-CM

## 2019-04-22 DIAGNOSIS — E785 Hyperlipidemia, unspecified: Secondary | ICD-10-CM | POA: Diagnosis not present

## 2019-04-22 DIAGNOSIS — G4701 Insomnia due to medical condition: Secondary | ICD-10-CM

## 2019-04-22 DIAGNOSIS — Z9884 Bariatric surgery status: Secondary | ICD-10-CM | POA: Diagnosis not present

## 2019-04-22 DIAGNOSIS — E519 Thiamine deficiency, unspecified: Secondary | ICD-10-CM

## 2019-04-22 DIAGNOSIS — E60 Dietary zinc deficiency: Secondary | ICD-10-CM

## 2019-04-22 DIAGNOSIS — R0609 Other forms of dyspnea: Secondary | ICD-10-CM

## 2019-04-22 DIAGNOSIS — D510 Vitamin B12 deficiency anemia due to intrinsic factor deficiency: Secondary | ICD-10-CM

## 2019-04-22 DIAGNOSIS — I10 Essential (primary) hypertension: Secondary | ICD-10-CM | POA: Diagnosis not present

## 2019-04-22 DIAGNOSIS — N400 Enlarged prostate without lower urinary tract symptoms: Secondary | ICD-10-CM

## 2019-04-22 DIAGNOSIS — R0600 Dyspnea, unspecified: Secondary | ICD-10-CM

## 2019-04-22 DIAGNOSIS — D508 Other iron deficiency anemias: Secondary | ICD-10-CM | POA: Diagnosis not present

## 2019-04-22 DIAGNOSIS — Z23 Encounter for immunization: Secondary | ICD-10-CM | POA: Diagnosis not present

## 2019-04-22 DIAGNOSIS — G8929 Other chronic pain: Secondary | ICD-10-CM

## 2019-04-22 MED ORDER — ESZOPICLONE 2 MG PO TABS: 2.0000 mg | ORAL_TABLET | Freq: Every evening | ORAL | 5 refills | Status: DC | PRN

## 2019-04-22 MED ORDER — IRBESARTAN 150 MG PO TABS: 150.0000 mg | ORAL_TABLET | Freq: Every day | ORAL | 0 refills | Status: DC

## 2019-04-22 MED ORDER — INDAPAMIDE 1.25 MG PO TABS: 1.2500 mg | ORAL_TABLET | Freq: Every day | ORAL | 0 refills | Status: DC

## 2019-04-22 NOTE — Patient Instructions (Signed)

## 2019-04-22 NOTE — Progress Notes (Addendum)
Subjective:  Patient ID: Bradley [name], male    DOB: 04-13-1970  Age: 49 y.o. MRN: [id number]  CC: Annual Exam, Hypertension, and Anemia  This visit occurred during the SARS-CoV-2 public health emergency.  Safety protocols were in place, including screening questions prior to the visit, additional usage of staff PPE, and extensive cleaning of exam room while observing appropriate contact time as indicated for disinfecting solutions.    HPI Bradley [name] presents for a CPX.  Over the last few months he has developed dyspnea on exertion.  He has felt like this before when his iron level was low and he was anemic.  He is not currently taking an iron supplement.  He wonders if he needs an iron infusion again.  He has no longer taking candesartan or hydrochlorothiazide.  He says if he consistently took that combination for several days then his blood pressure dropped too low.  He has not been monitoring his blood pressure.  He complains of DOE but he denies headache, blurred vision, diaphoresis, dizziness, lightheadedness, or edema.  He complains of chronic pain that keeps him awake.  He wants to take a medication to treat this.  Outpatient Medications Prior to Visit  Medication Sig Dispense Refill  . famotidine (PEPCID) 40 MG tablet Take 1 tablet (40 mg total) by mouth daily. 30 tablet 2  . ferrous sulfate 325 (65 FE) MG tablet Take 325 mg by mouth daily with breakfast.    . INS SYRINGE/NEEDLE 1CC/28G 28G X 1/2" 1 ML MISC Use to inject b12 monthly 10 each 1  . Multiple Vitamin (MULTIVITAMIN) tablet Take 1 tablet by mouth daily.    . nitroGLYCERIN (NITRODUR - DOSED IN MG/24 HR) 0.2 mg/hr patch Cut and apply 1/4 patch to most painful area q24h. 30 patch 11  . omeprazole (PRILOSEC) 40 MG capsule Take 1 capsule (40 mg total) by mouth daily. 90 capsule 3  . Oxycodone HCl 10 MG TABS Take 1 tablet by mouth 5 (five) times daily.  0  . thiamine (VITAMIN B-1) 100 MG tablet Take 1 tablet (100 mg  total) by mouth daily. 90 tablet 1  . triamcinolone (NASACORT) 55 MCG/ACT AERO nasal inhaler Place 4 sprays into the nose daily. 32.4 mL 3  . zinc gluconate 50 MG tablet Take 1 tablet (50 mg total) by mouth daily. 90 tablet 1  . candesartan-hydrochlorothiazide (ATACAND HCT) 32-12.5 MG tablet Take 1 tablet by mouth daily. 90 tablet 1  . Cholecalciferol 50 MCG (2000 UT) TABS Take 2 tablets (4,000 Units total) by mouth daily. 180 tablet 1  . predniSONE (DELTASONE) 5 MG tablet Take 6 pills for first day, 5 pills second day, 4 pills third day, 3 pills fourth day, 2 pills the fifth day, and 1 pill sixth day. 21 tablet 0  . traZODone (DESYREL) 100 MG tablet TAKE 1 TABLET(100 MG) BY MOUTH AT BEDTIME AS NEEDED FOR SLEEP 90 tablet 1   No facility-administered medications prior to visit.    ROS Review of Systems  Constitutional: Positive for fatigue. Negative for appetite change, diaphoresis and unexpected weight change.  HENT: Negative.  Negative for trouble swallowing.   Eyes: Negative.  Negative for visual disturbance.  Respiratory: Positive for shortness of breath (DOE). Negative for cough, chest tightness and wheezing.   Cardiovascular: Negative for chest pain, palpitations and leg swelling.  Gastrointestinal: Negative for abdominal pain, blood in stool, constipation, diarrhea, nausea and vomiting.  Endocrine: Negative.   Genitourinary: Negative.  Negative for difficulty  urinating, hematuria, penile swelling, scrotal swelling and testicular pain.  Musculoskeletal: Positive for arthralgias and back pain. Negative for myalgias and neck pain.  Skin: Negative for color change, pallor and rash.  Allergic/Immunologic: Negative.   Neurological: Negative.  Negative for facial asymmetry, weakness, light-headedness and numbness.  Hematological: Negative.   Psychiatric/Behavioral: Positive for sleep disturbance. Negative for behavioral problems, confusion, decreased concentration, dysphoric mood,  hallucinations, self-injury and suicidal ideas. The patient is not nervous/anxious.     Objective:  BP (!) 156/94 (BP Location: Left Arm, Patient Position: Sitting, Cuff Size: Large)   Pulse 86   Temp 98.3 F (36.8 C) (Oral)   Resp 16   Ht 5' 10"  (1.778 m)   Wt 205 lb (93 kg)   SpO2 94%   BMI 29.41 kg/m   BP Readings from Last 3 Encounters:  04/22/19 (!) 156/94  12/31/18 126/86  12/24/18 (!) 167/103    Wt Readings from Last 3 Encounters:  04/22/19 205 lb (93 kg)  12/31/18 195 lb (88.5 kg)  12/24/18 195 lb (88.5 kg)    Physical Exam Vitals reviewed.  Constitutional:      Appearance: Normal appearance.  HENT:     Nose: Nose normal.     Mouth/Throat:     Mouth: Mucous membranes are moist.  Eyes:     General: No scleral icterus.    Conjunctiva/sclera: Conjunctivae normal.  Cardiovascular:     Rate and Rhythm: Normal rate and regular rhythm.     Heart sounds: S1 normal and S2 normal. Murmur present. Systolic murmur present with a grade of 1/6. No diastolic murmur. No friction rub. No gallop.      Comments: EKG - NSR No LVH Normal EKG Pulmonary:     Effort: Pulmonary effort is normal.     Breath sounds: No stridor. No wheezing, rhonchi or rales.  Abdominal:     General: Abdomen is flat. Bowel sounds are normal. There is no distension.     Palpations: Abdomen is soft. There is no hepatomegaly, splenomegaly or mass.     Tenderness: There is no abdominal tenderness.     Hernia: There is no hernia in the left inguinal area or right inguinal area.  Genitourinary:    Pubic Area: No rash.      Penis: Normal. No lesions.      Testes: Normal.        Right: Mass not present.        Left: Mass not present.     Epididymis:     Right: Normal. No mass.     Left: Normal. No mass.     Prostate: Enlarged (1+ smooth symm BPH). Not tender and no nodules present.     Rectum: Normal. Guaiac result negative. No mass, tenderness, anal fissure, external hemorrhoid or internal  hemorrhoid. Normal anal tone.  Musculoskeletal:        General: Normal range of motion.     Cervical back: Neck supple.     Right lower leg: No edema.     Left lower leg: No edema.  Lymphadenopathy:     Cervical: No cervical adenopathy.     Lower Body: No right inguinal adenopathy. No left inguinal adenopathy.  Skin:    General: Skin is warm and dry.     Coloration: Skin is pale.     Findings: No rash.  Neurological:     General: No focal deficit present.     Mental Status: He is alert.  Psychiatric:  Mood and Affect: Mood normal.        Behavior: Behavior normal.     Lab Results  Component Value Date   WBC 6.7 04/28/2019   HGB 9.2 (L) 04/28/2019   HCT 30.2 (L) 04/28/2019   PLT 218.0 04/28/2019   GLUCOSE 92 04/28/2019   CHOL 133 04/28/2019   TRIG 67.0 04/28/2019   HDL 54.80 04/28/2019   LDLCALC 65 04/28/2019   ALT 18 04/28/2019   AST 21 04/28/2019   NA 137 04/28/2019   K 5.1 04/28/2019   CL 104 04/28/2019   CREATININE 1.08 04/28/2019   BUN 17 04/28/2019   CO2 26 04/28/2019   TSH 3.43 04/28/2019   PSA 0.88 04/28/2019   HGBA1C 5.7 10/25/2017    DG Ankle Complete Left  Result Date: 12/25/2018 CLINICAL DATA:  Sudden onset of left ankle pain and swelling about the bilateral malleoli. History of left ankle surgery 25 years ago. EXAM: LEFT ANKLE COMPLETE - 3+ VIEW COMPARISON:  None. FINDINGS: Soft tissue swelling about the midfoot along medial and lateral aspect. Degenerative changes in the midfoot. The ankle mortise is preserved. No signs of acute fracture. IMPRESSION: Soft tissue swelling about the midfoot with no underlying bony abnormality. Degenerative changes in the midfoot. Electronically Signed   By: Zetta Bills M.D.   On: 12/25/2018 08:58   [place] LIMITED JOINT SPACE STRUCTURES LOW LEFT  Result Date: 01/03/2019 Limited ultrasound: Left ankle/foot:  Moderate to severe effusion within the ankle joint anteriorly. Different areas of effusion noted in the  dorsal midfoot related to degenerative changes. No fracture of the distal fibula. Normal-appearing peroneal tendons. Normal insertion into the base of the fifth metatarsal. Effusion noted in the posterior tibialis as it courses inferior to the medial malleolus.  There is continued effusion within the tendon and appears to be a retraction of about a centimeter from the insertion.  Appears to be a partial tear of the posterior tibialis  Summary: Effusion of the ankle joint.  Degenerative changes and effusions of the different midfoot joints.  Appears to be a partial tear of the posterior tibialis  Ultrasound and interpretation by Clearance Coots, MD  DG Foot Complete Left  Result Date: 12/25/2018 CLINICAL DATA:  Sudden onset of pain, history of surgery. EXAM: LEFT FOOT - COMPLETE 3+ VIEW COMPARISON:  Ankle film of the same date. FINDINGS: Soft tissue swelling over the medial greater than lateral aspect of the ankle. Small fleck of bone seen medial to the navicular. Mild irregularity of the anterior aspect of the talus. IMPRESSION: 1. Soft tissue swelling over the medial aspect of the ankle. 2. Small fleck of bone medial to the navicular. Mild irregularity of the anterior talus could represent a small avulsion fracture. Electronically Signed   By: Zetta Bills M.D.   On: 12/25/2018 09:01    Assessment & Plan:   Shyquan was seen today for annual exam, hypertension and anemia.  Diagnoses and all orders for this visit:  Essential hypertension- His blood pressure is not adequately well controlled.  I recommended that he stop taking candesartan/HCTZ and switch to irbesartan/indapamide.  I will check his labs to screen for secondary causes or endorgan damage.  His EKG is negative for LVH or ischemia. -     Cancel: Basic metabolic panel -     Cancel: TSH -     Cancel: Urinalysis, Routine w reflex microscopic -     Discontinue: indapamide (LOZOL) 1.25 MG tablet; Take 1 tablet (1.25 mg total) by  mouth  daily. -     Discontinue: irbesartan (AVAPRO) 150 MG tablet; Take 1 tablet (150 mg total) by mouth daily. -     EKG 12-Lead -     Basic metabolic panel; Future -     irbesartan (AVAPRO) 150 MG tablet; Take 1 tablet (150 mg total) by mouth daily. -     indapamide (LOZOL) 1.25 MG tablet; Take 1 tablet (1.25 mg total) by mouth daily. -     TSH; Future -     Urinalysis, Routine w reflex microscopic; Future  Other iron deficiency anemia- I will recheck his H&H and iron level.  I will recommend an iron infusion if indicated. -     Cancel: IBC panel -     Cancel: CBC with Differential/Platelet -     Cancel: Ferritin -     CBC with Differential/Platelet; Future -     Ferritin; Future -     IBC panel; Future  Vitamin B12 deficiency anemia due to intrinsic factor deficiency- I will check a vitamin B12 level and will treat if indicated. -     Cancel: Vitamin B12 -     Cancel: CBC with Differential/Platelet -     Cancel: Folate -     CBC with Differential/Platelet; Future -     Folate; Future -     Vitamin B12; Future  History of gastric bypass- I will monitor him for vitamin deficiencies. -     Cancel: Vitamin B12 -     Cancel: IBC panel -     Cancel: Folate -     Cancel: Ferritin -     Cancel: Vitamin B1 -     Cancel: VITAMIN D 25 Hydroxy (Vit-D Deficiency, Fractures) -     Zinc; Future -     Ferritin; Future -     Folate; Future -     IBC panel; Future -     Vitamin B1; Future -     Vitamin B12; Future -     VITAMIN D 25 Hydroxy (Vit-D Deficiency, Fractures); Future  Hyperlipidemia LDL goal <130- I will check a fasting lipid panel and will treat with a statin if he has an elevated ASCVD risk score. -     Cancel: Lipid panel -     Cancel: TSH -     Cancel: Hepatic function panel -     Hepatic function panel; Future -     Lipid panel; Future -     TSH; Future  Thiamine deficiency- I will monitor his thiamine level -     Cancel: Vitamin B1 -     Vitamin B1; Future  Vitamin D  deficiency- I will monitor his vitamin D level. -     Cancel: VITAMIN D 25 Hydroxy (Vit-D Deficiency, Fractures) -     VITAMIN D 25 Hydroxy (Vit-D Deficiency, Fractures); Future  Zinc deficiency- I will monitor his zinc level. -     Zinc; Future  Routine general medical examination at a health care facility- Exam completed, labs reviewed, vaccines reviewed and updated, colon cancer screening is up-to-date, patient education was given.  Benign prostatic hyperplasia without lower urinary tract symptoms- Will check a PSA to screen for prostate cancer. -     Cancel: PSA -     PSA; Future  Need for influenza vaccination -     Flu Vaccine QUAD 36+ mos IM  DOE (dyspnea on exertion)- He has dyspnea on exertion but no other cardiovascular symptoms.  His EKG is negative for LVH or ischemia.  I will monitor for fluid overload with a BNP and will check for ischemia with a troponin.  I think the DOE is likely related to iron deficiency anemia. -     Cancel: Brain natriuretic peptide -     Cancel: Troponin I (High Sensitivity) -     Brain natriuretic peptide; Future -     Troponin I (High Sensitivity); Future  Insomnia secondary to chronic pain -     eszopiclone (LUNESTA) 2 MG TABS tablet; Take 1 tablet (2 mg total) by mouth at bedtime as needed for sleep. Take immediately before bedtime   I have discontinued Misha Antonini. Friedland's candesartan-hydrochlorothiazide, traZODone, and predniSONE. I am also having him start on eszopiclone. Additionally, I am having him maintain his multivitamin, Oxycodone HCl, zinc gluconate, thiamine, ferrous sulfate, famotidine, omeprazole, triamcinolone, nitroGLYCERIN, INS SYRINGE/NEEDLE 1CC/28G, irbesartan, and indapamide.  Meds ordered this encounter  Medications  . DISCONTD: indapamide (LOZOL) 1.25 MG tablet    Sig: Take 1 tablet (1.25 mg total) by mouth daily.    Dispense:  90 tablet    Refill:  0  . DISCONTD: irbesartan (AVAPRO) 150 MG tablet    Sig: Take 1 tablet  (150 mg total) by mouth daily.    Dispense:  90 tablet    Refill:  0  . irbesartan (AVAPRO) 150 MG tablet    Sig: Take 1 tablet (150 mg total) by mouth daily.    Dispense:  90 tablet    Refill:  0  . indapamide (LOZOL) 1.25 MG tablet    Sig: Take 1 tablet (1.25 mg total) by mouth daily.    Dispense:  90 tablet    Refill:  0  . eszopiclone (LUNESTA) 2 MG TABS tablet    Sig: Take 1 tablet (2 mg total) by mouth at bedtime as needed for sleep. Take immediately before bedtime    Dispense:  30 tablet    Refill:  5     Follow-up: Return in about 3 months (around 07/20/2019).  [name], MD

## 2019-04-28 ENCOUNTER — Other Ambulatory Visit (INDEPENDENT_AMBULATORY_CARE_PROVIDER_SITE_OTHER): Payer: Medicare Other

## 2019-04-28 DIAGNOSIS — E60 Dietary zinc deficiency: Secondary | ICD-10-CM | POA: Diagnosis not present

## 2019-04-28 DIAGNOSIS — E785 Hyperlipidemia, unspecified: Secondary | ICD-10-CM

## 2019-04-28 DIAGNOSIS — D508 Other iron deficiency anemias: Secondary | ICD-10-CM | POA: Diagnosis not present

## 2019-04-28 DIAGNOSIS — Z9884 Bariatric surgery status: Secondary | ICD-10-CM | POA: Diagnosis not present

## 2019-04-28 DIAGNOSIS — I10 Essential (primary) hypertension: Secondary | ICD-10-CM | POA: Diagnosis not present

## 2019-04-28 DIAGNOSIS — D510 Vitamin B12 deficiency anemia due to intrinsic factor deficiency: Secondary | ICD-10-CM

## 2019-04-28 DIAGNOSIS — R0609 Other forms of dyspnea: Secondary | ICD-10-CM

## 2019-04-28 DIAGNOSIS — E519 Thiamine deficiency, unspecified: Secondary | ICD-10-CM | POA: Diagnosis not present

## 2019-04-28 DIAGNOSIS — N400 Enlarged prostate without lower urinary tract symptoms: Secondary | ICD-10-CM

## 2019-04-28 DIAGNOSIS — R0600 Dyspnea, unspecified: Secondary | ICD-10-CM | POA: Diagnosis not present

## 2019-04-28 DIAGNOSIS — E559 Vitamin D deficiency, unspecified: Secondary | ICD-10-CM | POA: Diagnosis not present

## 2019-04-28 LAB — VITAMIN B12: Vitamin B-12: 452 pg/mL (ref 211–911)

## 2019-04-28 LAB — URINALYSIS, ROUTINE W REFLEX MICROSCOPIC
Bilirubin Urine: NEGATIVE
Nitrite: NEGATIVE
Specific Gravity, Urine: 1.025 (ref 1.000–1.030)
Total Protein, Urine: NEGATIVE
Urine Glucose: NEGATIVE
Urobilinogen, UA: 1 (ref 0.0–1.0)
pH: 6 (ref 5.0–8.0)

## 2019-04-28 LAB — HEPATIC FUNCTION PANEL
ALT: 18 U/L (ref 0–53)
AST: 21 U/L (ref 0–37)
Albumin: 4 g/dL (ref 3.5–5.2)
Alkaline Phosphatase: 83 U/L (ref 39–117)
Bilirubin, Direct: 0.1 mg/dL (ref 0.0–0.3)
Total Bilirubin: 0.4 mg/dL (ref 0.2–1.2)
Total Protein: 6.5 g/dL (ref 6.0–8.3)

## 2019-04-28 LAB — CBC WITH DIFFERENTIAL/PLATELET
Basophils Absolute: 0.1 10*3/uL (ref 0.0–0.1)
Basophils Relative: 0.8 % (ref 0.0–3.0)
Eosinophils Absolute: 0.1 10*3/uL (ref 0.0–0.7)
Eosinophils Relative: 0.9 % (ref 0.0–5.0)
HCT: 30.2 % — ABNORMAL LOW (ref 39.0–52.0)
Hemoglobin: 9.2 g/dL — ABNORMAL LOW (ref 13.0–17.0)
Lymphocytes Relative: 30.3 % (ref 12.0–46.0)
Lymphs Abs: 2 10*3/uL (ref 0.7–4.0)
MCHC: 30.3 g/dL (ref 30.0–36.0)
MCV: 67.5 fl — ABNORMAL LOW (ref 78.0–100.0)
Monocytes Absolute: 0.8 10*3/uL (ref 0.1–1.0)
Monocytes Relative: 12 % (ref 3.0–12.0)
Neutro Abs: 3.8 10*3/uL (ref 1.4–7.7)
Neutrophils Relative %: 56 % (ref 43.0–77.0)
Platelets: 218 10*3/uL (ref 150.0–400.0)
RBC: 4.47 Mil/uL (ref 4.22–5.81)
RDW: 17 % — ABNORMAL HIGH (ref 11.5–15.5)
WBC: 6.7 10*3/uL (ref 4.0–10.5)

## 2019-04-28 LAB — BASIC METABOLIC PANEL
BUN: 17 mg/dL (ref 6–23)
CO2: 26 mEq/L (ref 19–32)
Calcium: 8.8 mg/dL (ref 8.4–10.5)
Chloride: 104 mEq/L (ref 96–112)
Creatinine, Ser: 1.08 mg/dL (ref 0.40–1.50)
GFR: 72.2 mL/min (ref >60.00)
Glucose, Bld: 92 mg/dL (ref 70–99)
Potassium: 5.1 mEq/L (ref 3.5–5.1)
Sodium: 137 mEq/L (ref 135–145)

## 2019-04-28 LAB — IBC PANEL
Iron: 11 ug/dL — ABNORMAL LOW (ref 42–165)
Saturation Ratios: 2.5 % — ABNORMAL LOW (ref 20.0–50.0)
Transferrin: 318 mg/dL (ref 212.0–360.0)

## 2019-04-28 LAB — TROPONIN I (HIGH SENSITIVITY): High Sens Troponin I: 3 ng/L (ref 2–17)

## 2019-04-28 LAB — LIPID PANEL
Cholesterol: 133 mg/dL (ref 0–200)
HDL: 54.8 mg/dL (ref >39.00)
LDL Cholesterol: 65 mg/dL (ref 0–99)
NonHDL: 78.53
Total CHOL/HDL Ratio: 2
Triglycerides: 67 mg/dL (ref 0.0–149.0)
VLDL: 13.4 mg/dL (ref 0.0–40.0)

## 2019-04-28 LAB — VITAMIN D 25 HYDROXY (VIT D DEFICIENCY, FRACTURES): VITD: 24.74 ng/mL — ABNORMAL LOW (ref 30.00–100.00)

## 2019-04-28 LAB — PSA: PSA: 0.88 ng/mL (ref 0.10–4.00)

## 2019-04-28 LAB — FOLATE: Folate: 8.2 ng/mL (ref >5.9)

## 2019-04-28 LAB — FERRITIN: Ferritin: 2.6 ng/mL — ABNORMAL LOW (ref 22.0–322.0)

## 2019-04-28 LAB — BRAIN NATRIURETIC PEPTIDE: Pro B Natriuretic peptide (BNP): 62 pg/mL (ref 0.0–100.0)

## 2019-04-28 LAB — TSH: TSH: 3.43 u[IU]/mL (ref 0.35–4.50)

## 2019-04-28 NOTE — Addendum Note (Signed)
Addended by: [name] on: 04/28/2019 04:58 PM   Modules accepted: Orders

## 2019-04-29 ENCOUNTER — Encounter: Payer: Self-pay | Admitting: Internal Medicine

## 2019-04-29 ENCOUNTER — Other Ambulatory Visit: Payer: Self-pay | Admitting: Internal Medicine

## 2019-04-29 DIAGNOSIS — Z9884 Bariatric surgery status: Secondary | ICD-10-CM

## 2019-04-29 DIAGNOSIS — E559 Vitamin D deficiency, unspecified: Secondary | ICD-10-CM

## 2019-04-29 MED ORDER — CHOLECALCIFEROL 50 MCG (2000 UT) PO TABS: 2.0000 | ORAL_TABLET | Freq: Every day | ORAL | 1 refills | Status: DC

## 2019-05-02 ENCOUNTER — Encounter: Payer: Self-pay | Admitting: Internal Medicine

## 2019-05-02 MED ORDER — ZINC GLUCONATE 50 MG PO TABS: 50.0000 mg | ORAL_TABLET | Freq: Every day | ORAL | 1 refills | Status: DC

## 2019-05-02 NOTE — Addendum Note (Signed)
Addended by: [name] on: 05/02/2019 02:56 PM   Modules accepted: Orders

## 2019-05-03 LAB — VITAMIN B1: Vitamin B1 (Thiamine): 18 nmol/L (ref 8–30)

## 2019-05-03 LAB — ZINC: Zinc: 41 ug/dL — ABNORMAL LOW (ref 60–130)

## 2019-05-08 ENCOUNTER — Other Ambulatory Visit: Payer: Self-pay

## 2019-05-08 ENCOUNTER — Ambulatory Visit (HOSPITAL_COMMUNITY)
Admission: RE | Admit: 2019-05-08 | Discharge: 2019-05-08 | Disposition: A | Discharge status: Home / Self Care | Payer: Medicare Other | Source: Ambulatory Visit | Attending: Internal Medicine | Admitting: Internal Medicine

## 2019-05-08 DIAGNOSIS — Z9884 Bariatric surgery status: Secondary | ICD-10-CM | POA: Insufficient documentation

## 2019-05-08 DIAGNOSIS — D508 Other iron deficiency anemias: Secondary | ICD-10-CM | POA: Diagnosis not present

## 2019-05-08 MED ORDER — SODIUM CHLORIDE 0.9 % IV SOLN
750.0000 mg | Freq: Once | INTRAVENOUS | Status: AC
  Administered 2019-05-08: 750 mg via INTRAVENOUS
  Filled 2019-05-08: qty 15

## 2019-05-08 MED ORDER — SODIUM CHLORIDE 0.9 % IV SOLN
INTRAVENOUS | Status: DC | PRN
  Administered 2019-05-08: 250 mL via INTRAVENOUS

## 2019-05-08 NOTE — Progress Notes (Signed)
Patient received Injectafer via PIV as ordered by [name] MD. Observed for at least 30 minutes post infusion.Tolerated well, vitals stable, discharge instructions given, verbalized understanding. Patient alert, oriented and ambulatory at the time of discharge.

## 2019-05-08 NOTE — Discharge Instructions (Signed)

## 2019-05-12 ENCOUNTER — Encounter: Payer: Self-pay | Admitting: Internal Medicine

## 2019-05-21 DIAGNOSIS — M5416 Radiculopathy, lumbar region: Secondary | ICD-10-CM | POA: Diagnosis not present

## 2019-05-21 DIAGNOSIS — M5126 Other intervertebral disc displacement, lumbar region: Secondary | ICD-10-CM | POA: Diagnosis not present

## 2019-05-22 ENCOUNTER — Encounter: Payer: Self-pay | Admitting: Internal Medicine

## 2019-05-23 ENCOUNTER — Telehealth: Payer: Self-pay

## 2019-05-23 NOTE — Telephone Encounter (Signed)
Key: [id number]

## 2019-05-28 NOTE — Progress Notes (Signed)
Cardiology Office Note   Date:  05/29/2019   ID:  Bradley [name], DOB 01-12-1971, MRN [id number]  PCP:  Bradley Lima, MD  Cardiologist:   Bradley Carnes, MD   Pt presents for f/u of HTN   History of Present Illness: Bradley [name] is a 49 y.o. male with a history of HTN, obesity (s/p gastric bypass) and murmur  I saw him in 2016  Echo showed aortic sclerosis  CT did show coronary calcifications.    I last saw the pt in 2018  Since seen he denies CP  Breathing is OK  No dizziness   He is bothered most by back pain.   Followed by Bradley [name] in [place]        Current Outpatient Medications  Medication Sig Dispense Refill  . amoxicillin (AMOXIL) 875 MG tablet Take 875 mg by mouth 2 (two) times daily.    . Cholecalciferol 50 MCG (2000 UT) TABS Take 2 tablets (4,000 Units total) by mouth daily. 180 tablet 1  . cyanocobalamin (,VITAMIN B-12,) 1000 MCG/ML injection cyanocobalamin (vit B-12) 1,000 mcg/mL injection solution    . eszopiclone (LUNESTA) 2 MG TABS tablet Take 1 tablet (2 mg total) by mouth at bedtime as needed for sleep. Take immediately before bedtime 30 tablet 5  . famotidine (PEPCID) 40 MG tablet Take 1 tablet (40 mg total) by mouth daily. 30 tablet 2  . ferrous sulfate 325 (65 FE) MG tablet Take 325 mg by mouth daily with breakfast.    . indapamide (LOZOL) 1.25 MG tablet Take 1 tablet (1.25 mg total) by mouth daily. 90 tablet 0  . INS SYRINGE/NEEDLE 1CC/28G 28G X 1/2" 1 ML MISC Use to inject b12 monthly 10 each 1  . irbesartan (AVAPRO) 150 MG tablet Take 1 tablet (150 mg total) by mouth daily. 90 tablet 0  . Multiple Vitamin (MULTIVITAMIN) tablet Take 1 tablet by mouth daily.    . nitroGLYCERIN (NITRODUR - DOSED IN MG/24 HR) 0.2 mg/hr patch Cut and apply 1/4 patch to most painful area q24h. 30 patch 11  . omeprazole (PRILOSEC) 40 MG capsule Take 1 capsule (40 mg total) by mouth daily. 90 capsule 3  . Oxycodone HCl 10 MG TABS Take 1 tablet by mouth 5 (five) times daily.  0  .  thiamine (VITAMIN B-1) 100 MG tablet Take 1 tablet (100 mg total) by mouth daily. 90 tablet 1  . triamcinolone (NASACORT) 55 MCG/ACT AERO nasal inhaler Place 4 sprays into the nose daily. 32.4 mL 3  . zinc gluconate 50 MG tablet Take 1 tablet (50 mg total) by mouth daily. 90 tablet 1   No current facility-administered medications for this visit.    Allergies:   Codeine and Oxycodone-acetaminophen   Past Medical History:  Diagnosis Date  . Allergy   . Anemia    since gastric bypass  . Anxiety   . Aortic valve sclerosis   . Arthritis   . Arthropathy, unspecified, other specified sites   . CAD (coronary artery disease)   . Contact with or exposure to venereal diseases   . Depression   . Displacement of intervertebral disc, site unspecified, without myelopathy   . GERD (gastroesophageal reflux disease)   . Heart murmur    " slight, [place] said nothing to worry about."  . History of kidney stones   . Hypertension     " no longer have hypertension since gastric bypass "  . Lumbago   . Obesity, unspecified   .  Other, mixed, or unspecified nondependent drug abuse, unspecified   . Personal history of urinary calculi   . Pneumonia   . Priapism   . Stenosis, spinal, lumbar     Past Surgical History:  Procedure Laterality Date  . ABDOMINAL SURGERY    . ANKLE SURGERY    . ANTERIOR LAT LUMBAR FUSION Left 02/24/2016   Procedure: LEFT LUMBAR TWO-THREE  ANTERIOR LATERAL LUMBAR FUSION  ;  Surgeon: Earnie Larsson, MD;  Location: [facility];  Service: Neurosurgery;  Laterality: Left;  . BACK SURGERY    . GASTRIC BYPASS    . GASTRIC BYPASS  2008  . HERNIA REPAIR    . INCISION AND DRAINAGE ABSCESS Right 07/06/2004   great toe  . IRRIGATION AND DEBRIDEMENT ABSCESS Right 07/08/2004   great toe  . KNEE ARTHROPLASTY Right 04/26/2017   Procedure: RIGHT TOTAL KNEE ARTHROPLASTY WITH COMPUTER NAVIGATION;  Surgeon: Rod Can, MD;  Location: [facility];  Service: Orthopedics;  Laterality: Right;  Needs RNFA   . KNEE SURGERY    . LUMBAR PERCUTANEOUS PEDICLE SCREW 1 LEVEL Left 02/24/2016   Procedure: LUMBAR PERCUTANEOUS PEDICLE SCREW 1 LEVEL;  Surgeon: Earnie Larsson, MD;  Location: [facility];  Service: Neurosurgery;  Laterality: Left;  Marland Kitchen MICRODISCECTOMY LUMBAR Left 08/24/2008   T12 - L1  . panectomy    . TOTAL KNEE ARTHROPLASTY Right 04/2017     Social History:  The patient  reports that he has never smoked. He has never used smokeless tobacco. He reports that he does not drink alcohol or use drugs.   Family History:  The patient's family history includes Arthritis in his mother; Breast cancer in his mother; Colon cancer in his mother; Heart disease in his father; Hypertension in his father; Kidney failure in his mother.    ROS:  Please see the history of present illness. All other systems are reviewed and  Negative to the above problem except as noted.    PHYSICAL EXAM: VS:  BP (!) 110/52   Pulse 65   Ht 5' 10"  (1.778 m)   Wt 207 lb 6.4 oz (94.1 kg)   SpO2 95%   BMI 29.76 kg/m   GEN: Well nourished, well developed, in no acute distress  HEENT: normal  Neck: JVP normal  , carotid bruits, Cardiac: RRR; Gr II/VI systolic murmur base  No LE edema  2+ pulses   Respiratory:  clear to auscultation bilaterally, normal work of breathing GI: soft, nontender, nondistended, + BS  No hepatomegaly  MS: no deformity Moving all extremities   Skin: warm and dry, no rash Neuro:  Strength and sensation are intact Psych: euthymic mood, full affect   EKG:  EKG is not  ordered today      Lipid Panel    Component Value Date/Time   CHOL 133 04/28/2019 1138   TRIG 67.0 04/28/2019 1138   HDL 54.80 04/28/2019 1138   CHOLHDL 2 04/28/2019 1138   VLDL 13.4 04/28/2019 1138   LDLCALC 65 04/28/2019 1138      Wt Readings from Last 3 Encounters:  05/29/19 207 lb 6.4 oz (94.1 kg)  04/22/19 205 lb (93 kg)  12/31/18 195 lb (88.5 kg)      ASSESSMENT AND PLAN: 1  CAD Coronary calcifications on CT in past    No symptoms of ischemia/angina.  Follow   Monitor risks    3  HL  Good control   In Feb 2021 LDL was 65  HDL 54    4  HTN  BP is good on current regimen  5  Murmur  Pt with thickend AV on echo in 2016   Exam murmur again present   Will get echo to see about progression.   F/U in 1 year   Signed, Bradley Carnes, MD  05/29/2019 4:45 PM    [address], [place], [place]  [address] Phone: [phone]; Fax: [phone]

## 2019-05-29 ENCOUNTER — Encounter: Payer: Self-pay | Admitting: Internal Medicine

## 2019-05-29 ENCOUNTER — Other Ambulatory Visit: Payer: Self-pay

## 2019-05-29 ENCOUNTER — Ambulatory Visit: Payer: Medicare Other | Admitting: Internal Medicine

## 2019-05-29 VITALS — BP 110/52 | HR 65 | Ht 70.0 in | Wt 207.4 lb

## 2019-05-29 DIAGNOSIS — R011 Cardiac murmur, unspecified: Secondary | ICD-10-CM | POA: Diagnosis not present

## 2019-05-29 NOTE — Patient Instructions (Signed)
Medication Instructions:  No changes *If you need a refill on your cardiac medications before your next appointment, please call your pharmacy*   Lab Work: none If you have labs (blood work) drawn today and your tests are completely normal, you will receive your results only by: Marland Kitchen MyChart Message (if you have MyChart) OR . A paper copy in the mail If you have any lab test that is abnormal or we need to change your treatment, we will call you to review the results.   Testing/Procedures: Your physician has requested that you have an echocardiogram. Echocardiography is a painless test that uses sound waves to create images of your heart. It provides your doctor with information about the size and shape of your heart and how well your heart's chambers and valves are working. This procedure takes approximately one hour. There are no restrictions for this procedure.   Follow-Up: At [facility], you and your health needs are our priority.  As part of our continuing mission to provide you with exceptional heart care, we have created designated Provider Care Teams.  These Care Teams include your primary Cardiologist (physician) and Advanced Practice Providers (APPs -  Physician Assistants and Nurse Practitioners) who all work together to provide you with the care you need, when you need it.  Your next appointment:   12 month(s)  The format for your next appointment:   In Person  Provider:   You may see Dr. [name] or one of the following Advanced Practice Providers on your designated Care Team:    Richardson Dopp, PA-C  Robbie Lis, [place]   Other Instructions

## 2019-05-30 DIAGNOSIS — G894 Chronic pain syndrome: Secondary | ICD-10-CM | POA: Diagnosis not present

## 2019-06-11 DIAGNOSIS — M4310 Spondylolisthesis, site unspecified: Secondary | ICD-10-CM | POA: Diagnosis not present

## 2019-06-11 DIAGNOSIS — M5416 Radiculopathy, lumbar region: Secondary | ICD-10-CM | POA: Diagnosis not present

## 2019-06-14 ENCOUNTER — Ambulatory Visit: Payer: Medicare Other

## 2019-06-16 ENCOUNTER — Ambulatory Visit (HOSPITAL_COMMUNITY): Payer: Medicare Other | Attending: Internal Medicine

## 2019-06-16 ENCOUNTER — Other Ambulatory Visit: Payer: Self-pay

## 2019-06-16 DIAGNOSIS — R011 Cardiac murmur, unspecified: Secondary | ICD-10-CM | POA: Diagnosis not present

## 2019-06-18 ENCOUNTER — Telehealth: Payer: Self-pay

## 2019-06-18 NOTE — Telephone Encounter (Signed)
The patient has been notified of the result and verbalized understanding.  All questions (if any) were answered. Wilma Flavin, RN 06/18/2019 4:45 PM

## 2019-06-18 NOTE — Telephone Encounter (Signed)
New message   Patient is returning call for echo results. Please call.

## 2019-06-18 NOTE — Telephone Encounter (Signed)
-----   Message from [name] V, MD sent at 06/18/2019  3:00 PM EDT ----- Pumping function of heart is normal AV is thickened   There is very mild narrowing of the aortic valve   SHould not cause any symptoms   Will follow over time

## 2019-06-18 NOTE — Telephone Encounter (Signed)
LMTCB regarding echocardiogram results

## 2019-06-25 ENCOUNTER — Encounter: Payer: Self-pay | Admitting: Internal Medicine

## 2019-06-25 ENCOUNTER — Other Ambulatory Visit: Payer: Self-pay | Admitting: Internal Medicine

## 2019-06-25 DIAGNOSIS — K047 Periapical abscess without sinus: Secondary | ICD-10-CM

## 2019-06-25 MED ORDER — AMOXICILLIN-POT CLAVULANATE 875-125 MG PO TABS: 1.0000 | ORAL_TABLET | Freq: Two times a day (BID) | ORAL | 0 refills | Status: AC

## 2019-07-02 ENCOUNTER — Telehealth: Payer: Self-pay | Admitting: Internal Medicine

## 2019-07-02 DIAGNOSIS — N400 Enlarged prostate without lower urinary tract symptoms: Secondary | ICD-10-CM

## 2019-07-02 NOTE — Chronic Care Management (AMB) (Signed)
°  Chronic Care Management   Note  07/02/2019 Name: Bradley [name] MRN: [id number] DOB: 1971/01/28  Bradley [name] is a 49 y.o. year old male who is a primary care patient of [name], MD. I reached out to Otho Perl by phone today in response to a referral sent by Bradley [name] PCP, [name], MD.   Bradley [name] was given information about Chronic Care Management services today including:  1. CCM service includes personalized support from designated clinical staff supervised by his physician, including individualized plan of care and coordination with other care providers 2. 24/7 contact phone numbers for assistance for urgent and routine care needs. 3. Service will only be billed when office clinical staff spend 20 minutes or more in a month to coordinate care. 4. Only one practitioner may furnish and bill the service in a calendar month. 5. The patient may stop CCM services at any time (effective at the end of the month) by phone call to the office staff.   Patient agreed to services and verbal consent obtained.    This note is not being shared with the patient for the following reason: To respect privacy (The patient or proxy has requested that the information not be shared).  Follow up plan:   Raynicia Dukes [employer] Scheduler

## 2019-07-03 ENCOUNTER — Ambulatory Visit (INDEPENDENT_AMBULATORY_CARE_PROVIDER_SITE_OTHER): Payer: Medicare Other | Admitting: Internal Medicine

## 2019-07-03 ENCOUNTER — Other Ambulatory Visit: Payer: Self-pay

## 2019-07-03 ENCOUNTER — Ambulatory Visit (INDEPENDENT_AMBULATORY_CARE_PROVIDER_SITE_OTHER): Payer: Medicare Other

## 2019-07-03 ENCOUNTER — Encounter: Payer: Self-pay | Admitting: Internal Medicine

## 2019-07-03 VITALS — BP 118/78 | HR 74 | Temp 98.3°F | Ht 70.0 in | Wt 200.4 lb

## 2019-07-03 DIAGNOSIS — M79644 Pain in right finger(s): Secondary | ICD-10-CM | POA: Diagnosis not present

## 2019-07-03 DIAGNOSIS — S61250A Open bite of right index finger without damage to nail, initial encounter: Secondary | ICD-10-CM

## 2019-07-03 DIAGNOSIS — S61319A Laceration without foreign body of unspecified finger with damage to nail, initial encounter: Secondary | ICD-10-CM | POA: Insufficient documentation

## 2019-07-03 DIAGNOSIS — S62632A Displaced fracture of distal phalanx of right middle finger, initial encounter for closed fracture: Secondary | ICD-10-CM | POA: Diagnosis not present

## 2019-07-03 DIAGNOSIS — W540XXA Bitten by dog, initial encounter: Secondary | ICD-10-CM | POA: Diagnosis not present

## 2019-07-03 DIAGNOSIS — S62600A Fracture of unspecified phalanx of right index finger, initial encounter for closed fracture: Secondary | ICD-10-CM | POA: Diagnosis not present

## 2019-07-03 DIAGNOSIS — T148XXA Other injury of unspecified body region, initial encounter: Secondary | ICD-10-CM | POA: Diagnosis not present

## 2019-07-03 DIAGNOSIS — M79641 Pain in right hand: Secondary | ICD-10-CM | POA: Diagnosis not present

## 2019-07-03 DIAGNOSIS — M25641 Stiffness of right hand, not elsewhere classified: Secondary | ICD-10-CM | POA: Diagnosis not present

## 2019-07-03 NOTE — Progress Notes (Signed)
Subjective:  Patient ID: Bradley [name], male    DOB: February 07, 1971  Age: 49 y.o. MRN: [id number]  CC: Animal Bite (Right pointer finger; bit by dog on yesterday)  This visit occurred during the SARS-CoV-2 public health emergency.  Safety protocols were in place, including screening questions prior to the visit, additional usage of staff PPE, and extensive cleaning of exam room while observing appropriate contact time as indicated for disinfecting solutions.    HPI Bradley [name] presents for concerns about his right index finger. His dog bit it 24 hours ago. The area is painful but he denies N/W/T. He is taking augmentin for a dental infection. Tdap is UTD.   Outpatient Medications Prior to Visit  Medication Sig Dispense Refill  . amoxicillin (AMOXIL) 875 MG tablet Take 875 mg by mouth 2 (two) times daily.    Marland Kitchen amoxicillin-clavulanate (AUGMENTIN) 875-125 MG tablet Take 1 tablet by mouth 2 (two) times daily for 10 days. 20 tablet 0  . Cholecalciferol 50 MCG (2000 UT) TABS Take 2 tablets (4,000 Units total) by mouth daily. 180 tablet 1  . cyanocobalamin (,VITAMIN B-12,) 1000 MCG/ML injection cyanocobalamin (vit B-12) 1,000 mcg/mL injection solution    . eszopiclone (LUNESTA) 2 MG TABS tablet Take 1 tablet (2 mg total) by mouth at bedtime as needed for sleep. Take immediately before bedtime 30 tablet 5  . famotidine (PEPCID) 40 MG tablet Take 1 tablet (40 mg total) by mouth daily. 30 tablet 2  . ferrous sulfate 325 (65 FE) MG tablet Take 325 mg by mouth daily with breakfast.    . indapamide (LOZOL) 1.25 MG tablet Take 1 tablet (1.25 mg total) by mouth daily. 90 tablet 0  . INS SYRINGE/NEEDLE 1CC/28G 28G X 1/2" 1 ML MISC Use to inject b12 monthly 10 each 1  . irbesartan (AVAPRO) 150 MG tablet Take 1 tablet (150 mg total) by mouth daily. 90 tablet 0  . Multiple Vitamin (MULTIVITAMIN) tablet Take 1 tablet by mouth daily.    . nitroGLYCERIN (NITRODUR - DOSED IN MG/24 HR) 0.2 mg/hr patch Cut and  apply 1/4 patch to most painful area q24h. 30 patch 11  . omeprazole (PRILOSEC) 40 MG capsule Take 1 capsule (40 mg total) by mouth daily. 90 capsule 3  . Oxycodone HCl 10 MG TABS Take 1 tablet by mouth 5 (five) times daily.  0  . sildenafil (VIAGRA) 100 MG tablet Take 100 mg by mouth daily as needed.    . thiamine (VITAMIN B-1) 100 MG tablet Take 1 tablet (100 mg total) by mouth daily. 90 tablet 1  . triamcinolone (NASACORT) 55 MCG/ACT AERO nasal inhaler Place 4 sprays into the nose daily. 32.4 mL 3  . zinc gluconate 50 MG tablet Take 1 tablet (50 mg total) by mouth daily. 90 tablet 1   No facility-administered medications prior to visit.    ROS Review of Systems  Skin: Positive for wound.    Objective:  BP 118/78 (BP Location: Left Arm, Patient Position: Sitting, Cuff Size: Large)   Pulse 74   Temp 98.3 F (36.8 C) (Oral)   Ht 5' 10"  (1.778 m)   Wt 200 lb 6.4 oz (90.9 kg)   SpO2 98%   BMI 28.75 kg/m   BP Readings from Last 3 Encounters:  07/03/19 118/78  05/29/19 (!) 110/52  05/08/19 133/86    Wt Readings from Last 3 Encounters:  07/03/19 200 lb 6.4 oz (90.9 kg)  05/29/19 207 lb 6.4 oz (94.1 kg)  04/22/19 205 lb (93 kg)    Physical Exam Vitals reviewed.  Constitutional:      Appearance: Normal appearance.  Musculoskeletal:     Comments: RIF - Most of the fingernail is missing, there is a nail bed laceration. There is ecchymosis and swelling on the volar side. There is normal sensation and capillary refill. See photo.  Neurological:     Mental Status: He is alert.     Lab Results  Component Value Date   WBC 6.7 04/28/2019   HGB 9.2 (L) 04/28/2019   HCT 30.2 (L) 04/28/2019   PLT 218.0 04/28/2019   GLUCOSE 92 04/28/2019   CHOL 133 04/28/2019   TRIG 67.0 04/28/2019   HDL 54.80 04/28/2019   LDLCALC 65 04/28/2019   ALT 18 04/28/2019   AST 21 04/28/2019   NA 137 04/28/2019   K 5.1 04/28/2019   CL 104 04/28/2019   CREATININE 1.08 04/28/2019   BUN 17  04/28/2019   CO2 26 04/28/2019   TSH 3.43 04/28/2019   PSA 0.88 04/28/2019   HGBA1C 5.7 10/25/2017    DG Finger Index Right  Result Date: 07/03/2019 CLINICAL DATA:  Dog bite tip of finger yesterday. EXAM: RIGHT INDEX FINGER 2+V COMPARISON:  None. FINDINGS: Examination demonstrates a displaced transverse fracture of the distal tuft of the second finger. Remainder of the exam is unremarkable. IMPRESSION: Minimally displaced second distal tuft fracture. Electronically Signed   By: Marin Olp M.D.   On: 07/03/2019 10:06    Assessment & Plan:   Karan was seen today for animal bite.  Diagnoses and all orders for this visit:  Open wound of right index finger due to dog bite- He has a dog bite - distal aspect right index finger with destruction of the nail, a nail bed laceration, and the x-ray shows bony involvement.  I think he should see a hand surgeon to undergo surgical repair. -     DG Finger Index Right; Future  Laceration of nail bed of finger, initial encounter -     DG Finger Index Right; Future -     Ambulatory referral to Orthopedic Surgery   I am having [name] maintain his multivitamin, Oxycodone HCl, thiamine, ferrous sulfate, famotidine, omeprazole, triamcinolone, nitroGLYCERIN, INS SYRINGE/NEEDLE 1CC/28G, irbesartan, indapamide, eszopiclone, Cholecalciferol, zinc gluconate, amoxicillin, cyanocobalamin, amoxicillin-clavulanate, and sildenafil.  No orders of the defined types were placed in this encounter.    Follow-up: No follow-ups on file.  [name], MD

## 2019-07-03 NOTE — Patient Instructions (Signed)
Animal Bite, Adult Animal bite wounds can be mild or serious. It is important to get medical treatment to prevent infection. Ask your doctor if you need treatment to prevent an infection that can spread from animals to humans (rabies). Follow these instructions at home: Wound care   Follow instructions from your doctor about how to take care of your wound. Make sure you: ? Wash your hands with soap and water before you change your bandage (dressing). If you cannot use soap and water, use hand sanitizer. ? Change your bandage as told by your doctor. ? Leave stitches (sutures), skin glue, or skin tape (adhesive) strips in place. They may need to stay in place for 2 weeks or longer. If tape strips get loose and curl up, you may trim the loose edges. Do not remove tape strips completely unless your doctor says it is okay.  Check your wound every day for signs of infection. Check for: ? More redness, swelling, or pain. ? More fluid or blood. ? Warmth. ? Pus or a bad smell. Medicines  Take or apply over-the-counter and prescription medicines only as told by your doctor.  If you were prescribed an antibiotic, take or apply it as told by your doctor. Do not stop using the antibiotic even if your wound gets better. General instructions   Keep the injured area raised (elevated) above the level of your heart while you are sitting or lying down.  If directed, put ice on the injured area. ? Put ice in a plastic bag. ? Place a towel between your skin and the bag. ? Leave the ice on for 20 minutes, 2-3 times per day.  Keep all follow-up visits as told by your doctor. This is important. Contact a doctor if:  You have more redness, swelling, or pain around your wound.  Your wound feels warm to the touch.  You have a fever or chills.  You have a general feeling of sickness (malaise).  You feel sick to your stomach (nauseous).  You throw up (vomit).  You have pain that does not get  better. Get help right away if:  You have a red streak going away from your wound.  You have any of these coming from your wound: ? Non-clear fluid. ? More blood. ? Pus or a bad smell.  You have trouble moving your injured area.  You lose feeling (have numbness) or feel tingling anywhere on your body. Summary  It is important to get the right medical treatment for animal bites. Treatment can help you to not get an infection. Ask your doctor if you need treatment to prevent an infection that can spread from animals to humans (rabies).  Check your wound every day for signs of infection, such as more redness or swelling instead of less.  If you have a red streak going away from your wound, get medical help right away. This information is not intended to replace advice given to you by your health care provider. Make sure you discuss any questions you have with your health care provider. Document Revised: 02/15/2017 Document Reviewed: 08/31/2016 Elsevier Patient Education  [place].

## 2019-07-07 DIAGNOSIS — S61350D Open bite of right index finger with damage to nail, subsequent encounter: Secondary | ICD-10-CM | POA: Diagnosis not present

## 2019-07-11 DIAGNOSIS — M5416 Radiculopathy, lumbar region: Secondary | ICD-10-CM | POA: Diagnosis not present

## 2019-07-15 DIAGNOSIS — W540XXA Bitten by dog, initial encounter: Secondary | ICD-10-CM | POA: Diagnosis not present

## 2019-07-15 DIAGNOSIS — S61250A Open bite of right index finger without damage to nail, initial encounter: Secondary | ICD-10-CM | POA: Diagnosis not present

## 2019-07-15 DIAGNOSIS — S61250D Open bite of right index finger without damage to nail, subsequent encounter: Secondary | ICD-10-CM | POA: Diagnosis not present

## 2019-07-25 NOTE — Addendum Note (Signed)
Addended by: Aviva Signs M on: 07/25/2019 02:06 PM   Modules accepted: Orders

## 2019-07-28 ENCOUNTER — Other Ambulatory Visit: Payer: Self-pay

## 2019-07-28 ENCOUNTER — Ambulatory Visit: Payer: Medicare Other

## 2019-07-28 DIAGNOSIS — E538 Deficiency of other specified B group vitamins: Secondary | ICD-10-CM

## 2019-07-28 DIAGNOSIS — E519 Thiamine deficiency, unspecified: Secondary | ICD-10-CM

## 2019-07-28 DIAGNOSIS — E60 Dietary zinc deficiency: Secondary | ICD-10-CM

## 2019-07-28 DIAGNOSIS — I10 Essential (primary) hypertension: Secondary | ICD-10-CM

## 2019-07-28 DIAGNOSIS — D510 Vitamin B12 deficiency anemia due to intrinsic factor deficiency: Secondary | ICD-10-CM

## 2019-07-28 NOTE — Patient Instructions (Addendum)
Visit Information  Thank you for meeting with me to discuss your medications! I look forward to working with you to achieve your health care goals. Below is a summary of what we talked about during the visit:  Goals Addressed            This Visit's Progress   . Pharmacy Care Plan       CARE PLAN ENTRY  Current Barriers:  . Chronic Disease Management support, education, and care coordination needs related to Hypertension and Vitamin Deficiencies   Hypertension . Pharmacist Clinical Goal(s): o Over the next 180 days, patient will work with PharmD and providers to maintain BP goal <130/80 . Current regimen:  o Indapamide 1.25 mg daily o Irbesartan 150 mg daily . Interventions: o Discussed benefits of monitoring BP at home . Patient self care activities - Over the next 180 days, patient will: o Check BP 1-2 times weekly, document, and provide at future appointments o Ensure daily salt intake < 2300 mg/day  Vitamin Deficiencies  Ref. Range 04/28/2019 11:38  Vitamin B1 (Thiamine) Latest Ref Range: 8 - 30 nmol/L 18  VITD Latest Ref Range: 30.00 - 100.00 ng/mL 24.74 (L)  Vitamin B12 Latest Ref Range: 211 - 911 pg/mL 452  Zinc Latest Ref Range: 60 - 130 mcg/dL 41 (L)   . Pharmacist Clinical Goal(s) o Over the next 180 days, patient will work with PharmD and providers to optimize vitamin regimen . Current regimen:  o Vitamin B12 1000 mcg/mL injection o Vitamin D 2000 IU - 2 tablet daily o Zinc 50 mg daily o Multivitamin . Interventions: o Discussed optimal vitamin ranges  . Patient self care activities - Over the next 180 days, patient will: o Continue supplements as directed  Medication management . Pharmacist Clinical Goal(s): o Over the next 180 days, patient will work with PharmD and providers to maintain optimal medication adherence . Current pharmacy: [facility] . Interventions o Comprehensive medication review performed. o Continue current medication management  strategy . Patient self care activities - Over the next 180 days, patient will: o Focus on medication adherence by fill date o Take medications as prescribed o Report any questions or concerns to PharmD and/or provider(s)  Initial goal documentation       Mr. Thain was given information about Chronic Care Management services today including:  1. CCM service includes personalized support from designated clinical staff supervised by his physician, including individualized plan of care and coordination with other care providers 2. 24/7 contact phone numbers for assistance for urgent and routine care needs. 3. Standard insurance, coinsurance, copays and deductibles apply for chronic care management only during months in which we provide at least 20 minutes of these services. Most insurances cover these services at 100%, however patients may be responsible for any copay, coinsurance and/or deductible if applicable. This service may help you avoid the need for more expensive face-to-face services. 4. Only one practitioner may furnish and bill the service in a calendar month. 5. The patient may stop CCM services at any time (effective at the end of the month) by phone call to the office staff.  Patient agreed to services and verbal consent obtained.   The patient verbalized understanding of instructions provided today and declined a print copy of patient instruction materials.  Telephone follow up appointment with pharmacy team member scheduled for: 6 months  Charlene Brooke, PharmD Clinical Pharmacist [facility] [phone]    How to Take Your Blood Pressure  Blood pressure is a measurement of how strongly your blood is pressing against the walls of your arteries. Arteries are blood vessels that carry blood from your heart throughout your body. Your health care provider takes your blood pressure at each office visit. You can also take your own blood pressure at  home with a blood pressure machine. You may need to take your own blood pressure:  To confirm a diagnosis of high blood pressure (hypertension).  To monitor your blood pressure over time.  To make sure your blood pressure medicine is working. Supplies needed: To take your blood pressure, you will need a blood pressure machine. You can buy a blood pressure machine, or blood pressure monitor, at most drugstores or online. There are several types of home blood pressure monitors. When choosing one, consider the following:  Choose a monitor that has an arm cuff.  Choose a cuff that wraps snugly around your upper arm. You should be able to fit only one finger between your arm and the cuff.  Do not choose a monitor that measures your blood pressure from your wrist or finger. Your health care provider can suggest a reliable monitor that will meet your needs. How to prepare To get the most accurate reading, avoid the following for 30 minutes before you check your blood pressure:  Drinking caffeine.  Drinking alcohol.  Eating.  Smoking.  Exercising. Five minutes before you check your blood pressure:  Empty your bladder.  Sit quietly without talking in a dining chair, rather than in a soft couch or armchair. How to take your blood pressure To check your blood pressure, follow the instructions in the manual that came with your blood pressure monitor. If you have a digital blood pressure monitor, the instructions may be as follows: 1. Sit up straight. 2. Place your feet on the floor. Do not cross your ankles or legs. 3. Rest your left arm at the level of your heart on a table or desk or on the arm of a chair. 4. Pull up your shirt sleeve. 5. Wrap the blood pressure cuff around the upper part of your left arm, 1 inch (2.5 cm) above your elbow. It is best to wrap the cuff around bare skin. 6. Fit the cuff snugly around your arm. You should be able to place only one finger between the cuff  and your arm. 7. Position the cord inside the groove of your elbow. 8. Press the power button. 9. Sit quietly while the cuff inflates and deflates. 10. Read the digital reading on the monitor screen and write it down (record it). 11. Wait 2-3 minutes, then repeat the steps, starting at step 1. What does my blood pressure reading mean? A blood pressure reading consists of a higher number over a lower number. Ideally, your blood pressure should be below 120/80. The first ("top") number is called the systolic pressure. It is a measure of the pressure in your arteries as your heart beats. The second ("bottom") number is called the diastolic pressure. It is a measure of the pressure in your arteries as the heart relaxes. Blood pressure is classified into four stages. The following are the stages for adults who do not have a short-term serious illness or a chronic condition. Systolic pressure and diastolic pressure are measured in a unit called mm Hg. Normal  Systolic pressure: below 546.  Diastolic pressure: below 80. Elevated  Systolic pressure: 270-350.  Diastolic pressure: below 80. Hypertension stage 1  Systolic pressure: 093-818.  Diastolic pressure: 79-89. Hypertension stage 2  Systolic pressure: 211 or above.  Diastolic pressure: 90 or above. You can have prehypertension or hypertension even if only the systolic or only the diastolic number in your reading is higher than normal. Follow these instructions at home:  Check your blood pressure as often as recommended by your health care provider.  Take your monitor to the next appointment with your health care provider to make sure: ? That you are using it correctly. ? That it provides accurate readings.  Be sure you understand what your goal blood pressure numbers are.  Tell your health care provider if you are having any side effects from blood pressure medicine. Contact a health care provider if:  Your blood pressure is  consistently high. Get help right away if:  Your systolic blood pressure is higher than 180.  Your diastolic blood pressure is higher than 110. This information is not intended to replace advice given to you by your health care provider. Make sure you discuss any questions you have with your health care provider. Document Revised: 02/02/2017 Document Reviewed: 07/30/2015 Elsevier Patient Education  2020 [employer].

## 2019-07-28 NOTE — Chronic Care Management (AMB) (Signed)
Chronic Care Management Pharmacy  Name: Bradley [name]  MRN: [id number] DOB: [birth year]   Chief Complaint/ HPI  Otho Perl,  49 y.o. , male presents for their Initial CCM visit with the clinical pharmacist via telephone due to COVID-19 Pandemic.  PCP : [name], MD  Their chronic conditions include: HTN, HLD, NASH cirrhosis, GERD, osteoarthritis, BPH, back pain, insomnia, hx gastric bypass  Wife worked on [employer] before Financial risk analyst. Daughter in law works as Customer service manager in [facility] ED. Son works in [place] at [employer]. Two daughters and many grandkids. Takes care of 2 grandkids during the day - gets 7-10,000 steps per day.   Office Visits: 07/03/19 Dr Ronnald Ramp OV: dog bite, referred to hand surgeon.  04/22/19 Dr Ronnald Ramp OV: new dyspnea on exertion. Pt also stopped BP meds on his own. Rx'd irbesartan and indapamide  Consult Visit: 05/29/19 Dr Harrington Challenger (cardiology): heart murmer, no med changes. 05/08/19: iron infusion   Medications: Outpatient Encounter Medications as of 07/28/2019  Medication Sig  . Cholecalciferol 50 MCG (2000 UT) TABS Take 2 tablets (4,000 Units total) by mouth daily.  . cyanocobalamin (,VITAMIN B-12,) 1000 MCG/ML injection cyanocobalamin (vit B-12) 1,000 mcg/mL injection solution  . eszopiclone (LUNESTA) 2 MG TABS tablet Take 1 tablet (2 mg total) by mouth at bedtime as needed for sleep. Take immediately before bedtime  . indapamide (LOZOL) 1.25 MG tablet Take 1 tablet (1.25 mg total) by mouth daily.  . INS SYRINGE/NEEDLE 1CC/28G 28G X 1/2" 1 ML MISC Use to inject b12 monthly  . irbesartan (AVAPRO) 150 MG tablet Take 1 tablet (150 mg total) by mouth daily.  . Multiple Vitamin (MULTIVITAMIN) tablet Take 1 tablet by mouth daily.  . Oxycodone HCl 10 MG TABS Take 1 tablet by mouth 5 (five) times daily.  . sildenafil (VIAGRA) 100 MG tablet Take 100 mg by mouth daily as needed.  . zinc gluconate 50 MG tablet Take 1 tablet (50 mg total) by mouth daily.  Marland Kitchen amoxicillin (AMOXIL) 875 MG  tablet Take 875 mg by mouth 2 (two) times daily.  . famotidine (PEPCID) 40 MG tablet Take 1 tablet (40 mg total) by mouth daily. (Patient not taking: Reported on 07/28/2019)  . ferrous sulfate 325 (65 FE) MG tablet Take 325 mg by mouth daily with breakfast.  . nitroGLYCERIN (NITRODUR - DOSED IN MG/24 HR) 0.2 mg/hr patch Cut and apply 1/4 patch to most painful area q24h. (Patient not taking: Reported on 07/28/2019)  . omeprazole (PRILOSEC) 40 MG capsule Take 1 capsule (40 mg total) by mouth daily. (Patient not taking: Reported on 07/28/2019)  . thiamine (VITAMIN B-1) 100 MG tablet Take 1 tablet (100 mg total) by mouth daily. (Patient not taking: Reported on 07/28/2019)  . triamcinolone (NASACORT) 55 MCG/ACT AERO nasal inhaler Place 4 sprays into the nose daily. (Patient not taking: Reported on 07/28/2019)   No facility-administered encounter medications on file as of 07/28/2019.    Current Diagnosis/Assessment:  SDOH Interventions     Most Recent Value  SDOH Interventions  SDOH Interventions for the Following Domains  Financial Strain  Financial Strain Interventions  Other (Comment) [medications are affordable]     Goals Addressed            This Visit's Progress   . Pharmacy Care Plan       CARE PLAN ENTRY  Current Barriers:  . Chronic Disease Management support, education, and care coordination needs related to Hypertension and Vitamin Deficiencies   Hypertension . Pharmacist Clinical  Goal(s): o Over the next 180 days, patient will work with PharmD and providers to maintain BP goal <130/80 . Current regimen:  o Indapamide 1.25 mg daily o Irbesartan 150 mg daily . Interventions: o Discussed benefits of monitoring BP at home . Patient self care activities - Over the next 180 days, patient will: o Check BP 1-2 times weekly, document, and provide at future appointments o Ensure daily salt intake < 2300 mg/day  Vitamin Deficiencies  Ref. Range 04/28/2019 11:38  Vitamin B1  (Thiamine) Latest Ref Range: 8 - 30 nmol/L 18  VITD Latest Ref Range: 30.00 - 100.00 ng/mL 24.74 (L)  Vitamin B12 Latest Ref Range: 211 - 911 pg/mL 452  Zinc Latest Ref Range: 60 - 130 mcg/dL 41 (L)   . Pharmacist Clinical Goal(s) o Over the next 180 days, patient will work with PharmD and providers to optimize vitamin regimen . Current regimen:  o Vitamin B12 1000 mcg/mL injection o Vitamin D 2000 IU - 2 tablet daily o Zinc 50 mg daily o Multivitamin . Interventions: o Discussed optimal vitamin ranges  . Patient self care activities - Over the next 180 days, patient will: o Continue supplements as directed  Medication management . Pharmacist Clinical Goal(s): o Over the next 180 days, patient will work with PharmD and providers to maintain optimal medication adherence . Current pharmacy: [facility] . Interventions o Comprehensive medication review performed. o Continue current medication management strategy . Patient self care activities - Over the next 180 days, patient will: o Focus on medication adherence by fill date o Take medications as prescribed o Report any questions or concerns to PharmD and/or provider(s)  Initial goal documentation       Hypertension   BP goal is:  <130/80  Office blood pressures are  BP Readings from Last 3 Encounters:  07/03/19 118/78  05/29/19 (!) 110/52  05/08/19 133/86   Patient has failed these meds in the past: n/a Patient is currently controlled on the following medications:   Indapamide 1.25 mg daily  Irbesartan 150 mg daily  Patient checks BP at home infrequently  Patient home BP readings are ranging: n/a  We discussed diet and exercise extensively; benefits of checking BP at home and dangers of hypotension.  Plan  Continue current medications and control with diet and exercise     Hyperlipidemia   Lipid Panel     Component Value Date/Time   CHOL 133 04/28/2019 1138   TRIG 67.0 04/28/2019 1138   HDL 54.80  04/28/2019 1138   CHOLHDL 2 04/28/2019 1138   VLDL 13.4 04/28/2019 1138   [place] 65 04/28/2019 1138    The 10-year ASCVD risk score Mikey Bussing DC Jr., et al., 2013) is: 1.8%   Values used to calculate the score:     Age: 67 years     Sex: Male     Is Non-Hispanic African American: No     Diabetic: No     Tobacco smoker: No     Systolic Blood Pressure: 295 mmHg     Is BP treated: Yes     HDL Cholesterol: 54.8 mg/dL     Total Cholesterol: 133 mg/dL   Patient has failed these meds in past: n/a Patient is currently controlled on the following medications: no medications   We discussed:  diet and exercise extensively  Plan  Continue control with diet and exercise  GERD   Patient has failed these meds in past: n/a Patient is currently controlled on the following medications:  Omeprazole 40 mg daily  Famotidine 40 mg daily  We discussed:  Pt uses meds as needed, rarely needs to take anything for GERD  Plan  Continue current medications   Chronic back pain   Patient has failed these meds in past: n/a Patient is currently controlled on the following medications:   Oxycodone 10 mg 5 times daily  Naproxen PRN  Spinal injections every few months  We discussed:  Working on getting back surgery, trying to schedule around November. Sees pain mgmt every 3-4 months  Plan  Continue current medications and control with diet and exercise   Insomnia   Patient has failed these meds in past: trazodone Patient is currently controlled on the following medications:   Eszopliclone 2 mg HS  We discussed:  Pt reports he used to take something else for sleep that caused sleep walking; he denies this side effect with Lunesta.   Plan  Continue current medications  Iron deficiency anemia   CBC Latest Ref Rng & Units 04/28/2019 05/28/2018 04/11/2018  WBC 4.0 - 10.5 K/uL 6.7 5.4 6.6  Hemoglobin 13.0 - 17.0 g/dL 9.2(L) 12.7(L) 8.5 Repeated and verified X2.(L)  Hematocrit 39.0 - 52.0  % 30.2(L) 39.9 27.3(L)  Platelets 150.0 - 400.0 K/uL 218.0 241.0 312.0   Iron/TIBC/Ferritin/ %Sat    Component Value Date/Time   IRON 11 (L) 04/28/2019 1138   IRON 57 04/28/2016 1420   TIBC 346 04/28/2016 1420   FERRITIN 2.6 (L) 04/28/2019 1138   FERRITIN 13 (L) 04/28/2016 1420   IRONPCTSAT 2.5 (L) 04/28/2019 1138   IRONPCTSAT 16 (L) 04/28/2016 1420   Patient has failed these meds in past: iron infusions, ferrous sulfate 325 mg daily Patient is currently controlled on the following medications: no medication  We discussed:  Iron infusion vs oral iron efficacy. Pt has hx of gastric bypass so absorption is limited for oral iron.   Plan  Continue current medications  Allergies   Patient has failed these meds in past: n/a Patient is currently controlled on the following medications:  Triamcinolone nasal spray  We discussed:  Takes PRN, not very often  Plan  Continue current medications  Health Maintenance/Vitamin deficiencies s/p gastric bypass   Results for KAEDIN, HICKLIN (MRN [id number]) as of 07/28/2019 08:27  Ref. Range 04/28/2019 11:38  Vitamin B1 (Thiamine) Latest Ref Range: 8 - 30 nmol/L 18  VITD Latest Ref Range: 30.00 - 100.00 ng/mL 24.74 (L)  Vitamin B12 Latest Ref Range: 211 - 911 pg/mL 452  Zinc Latest Ref Range: 60 - 130 mcg/dL 41 (L)   Patient is currently controlled on the following medications:   Vitamin B12 1000 mcg/mL injection  Vitamin D 2000 IU - 2 tablet daily  Multivitamin  Zinc 50 mg daily  We discussed:  Vitamin deficiencies due to history of gastric bypass  Plan  Continue current medications  Medication Management   Pt uses [facility] for all medications Uses pill box? No - keeps in drawer Pt endorses 99% compliance  We discussed: Pt has medications on auto refill at [facility], gets 90 day supplies and denies issues.  Plan  Continue current medication management strategy    Follow up: 6 month phone visit  Charlene Brooke, PharmD Clinical Pharmacist [facility] [phone]

## 2019-09-01 DIAGNOSIS — G894 Chronic pain syndrome: Secondary | ICD-10-CM | POA: Diagnosis not present

## 2019-09-23 DIAGNOSIS — M5416 Radiculopathy, lumbar region: Secondary | ICD-10-CM | POA: Diagnosis not present

## 2019-10-06 ENCOUNTER — Encounter: Payer: Self-pay | Admitting: Family Medicine

## 2019-10-06 ENCOUNTER — Ambulatory Visit (HOSPITAL_BASED_OUTPATIENT_CLINIC_OR_DEPARTMENT_OTHER)
Admission: RE | Admit: 2019-10-06 | Discharge: 2019-10-06 | Disposition: A | Discharge status: Home / Self Care | Payer: Medicare Other | Source: Ambulatory Visit | Attending: Family Medicine | Admitting: Family Medicine

## 2019-10-06 ENCOUNTER — Other Ambulatory Visit: Payer: Self-pay

## 2019-10-06 ENCOUNTER — Ambulatory Visit (INDEPENDENT_AMBULATORY_CARE_PROVIDER_SITE_OTHER): Payer: Medicare Other | Admitting: Family Medicine

## 2019-10-06 VITALS — BP 134/84 | HR 55 | Ht 70.0 in | Wt 195.0 lb

## 2019-10-06 DIAGNOSIS — M79675 Pain in left toe(s): Secondary | ICD-10-CM

## 2019-10-06 DIAGNOSIS — S99922A Unspecified injury of left foot, initial encounter: Secondary | ICD-10-CM | POA: Diagnosis not present

## 2019-10-06 DIAGNOSIS — I878 Other specified disorders of veins: Secondary | ICD-10-CM | POA: Diagnosis not present

## 2019-10-06 NOTE — Assessment & Plan Note (Signed)
Injury occurred this past weekend concern for fracture given swelling and ecchymosis. -Counseled supportive care. -Postop shoe. -X-ray.

## 2019-10-06 NOTE — Progress Notes (Signed)
Bradley [name] - 49 y.o. male MRN [id number]  Date of birth: [birth year]  SUBJECTIVE:  Including CC & ROS.  Chief Complaint  Patient presents with  . Toe Injury    left Great Toe    Bradley [name] is a 49 y.o. male that is presenting with left great toe pain.  He had an injury this past weekend.  Since that time he has had ecchymosis and swelling over the dorsum of the left great toe.  Has pain with ambulation.   Review of Systems See HPI   HISTORY: Past Medical, Surgical, Social, and Family History Reviewed & Updated per EMR.   Pertinent Historical Findings include:  Past Medical History:  Diagnosis Date  . Allergy   . Anemia    since gastric bypass  . Anxiety   . Aortic valve sclerosis   . Arthritis   . Arthropathy, unspecified, other specified sites   . CAD (coronary artery disease)   . Contact with or exposure to venereal diseases   . Depression   . Displacement of intervertebral disc, site unspecified, without myelopathy   . GERD (gastroesophageal reflux disease)   . Heart murmur    " slight, [place] said nothing to worry about."  . History of kidney stones   . Hypertension     " no longer have hypertension since gastric bypass "  . Lumbago   . Obesity, unspecified   . Other, mixed, or unspecified nondependent drug abuse, unspecified   . Personal history of urinary calculi   . Pneumonia   . Priapism   . Stenosis, spinal, lumbar     Past Surgical History:  Procedure Laterality Date  . ABDOMINAL SURGERY    . ANKLE SURGERY    . ANTERIOR LAT LUMBAR FUSION Left 02/24/2016   Procedure: LEFT LUMBAR TWO-THREE  ANTERIOR LATERAL LUMBAR FUSION  ;  Surgeon: Earnie Larsson, MD;  Location: [facility];  Service: Neurosurgery;  Laterality: Left;  . BACK SURGERY    . GASTRIC BYPASS    . GASTRIC BYPASS  2008  . HERNIA REPAIR    . INCISION AND DRAINAGE ABSCESS Right 07/06/2004   great toe  . IRRIGATION AND DEBRIDEMENT ABSCESS Right 07/08/2004   great toe  . KNEE ARTHROPLASTY Right  04/26/2017   Procedure: RIGHT TOTAL KNEE ARTHROPLASTY WITH COMPUTER NAVIGATION;  Surgeon: Rod Can, MD;  Location: [facility];  Service: Orthopedics;  Laterality: Right;  Needs RNFA  . KNEE SURGERY    . LUMBAR PERCUTANEOUS PEDICLE SCREW 1 LEVEL Left 02/24/2016   Procedure: LUMBAR PERCUTANEOUS PEDICLE SCREW 1 LEVEL;  Surgeon: Earnie Larsson, MD;  Location: [facility];  Service: Neurosurgery;  Laterality: Left;  Marland Kitchen MICRODISCECTOMY LUMBAR Left 08/24/2008   T12 - L1  . panectomy    . TOTAL KNEE ARTHROPLASTY Right 04/2017    Family History  Problem Relation Age of Onset  . Arthritis Mother   . Colon cancer Mother   . Breast cancer Mother        twice  . Kidney failure Mother   . Heart disease Father   . Hypertension Father   . Esophageal cancer Neg Hx   . Inflammatory bowel disease Neg Hx   . Liver disease Neg Hx   . Pancreatic cancer Neg Hx   . Stomach cancer Neg Hx   . Rectal cancer Neg Hx     Social History   Socioeconomic History  . Marital status: Married    Spouse name: Not on file  .  Number of children: 3  . Years of education: Not on file  . Highest education level: Not on file  Occupational History  . Occupation: disabled  Tobacco Use  . Smoking status: Never Smoker  . Smokeless tobacco: Never Used  Vaping Use  . Vaping Use: Never used  Substance and Sexual Activity  . Alcohol use: No  . Drug use: No  . Sexual activity: Yes  Other Topics Concern  . Not on file  Social History Narrative  . Not on file   Social Determinants of Health   Financial Resource Strain: Low Risk   . Difficulty of Paying Living Expenses: Not very hard  Food Insecurity:   . Worried About Charity fundraiser in the Last Year:   . Arboriculturist in the Last Year:   Transportation Needs:   . Film/video editor (Medical):   Marland Kitchen Lack of Transportation (Non-Medical):   Physical Activity:   . Days of Exercise per Week:   . Minutes of Exercise per Session:   Stress:   . Feeling of Stress :    Social Connections:   . Frequency of Communication with Friends and Family:   . Frequency of Social Gatherings with Friends and Family:   . Attends Religious Services:   . Active Member of Clubs or Organizations:   . Attends Archivist Meetings:   Marland Kitchen Marital Status:   Intimate Partner Violence:   . Fear of Current or Ex-Partner:   . Emotionally Abused:   Marland Kitchen Physically Abused:   . Sexually Abused:      PHYSICAL EXAM:  VS: BP 134/84   Pulse (!) 55   Ht 5' 10"  (1.778 m)   Wt 195 lb (88.5 kg)   BMI 27.98 kg/m  Physical Exam Gen: NAD, alert, cooperative with exam, well-appearing MSK:  Left foot: Tenderness to palpation over the distal phalanx of the great toe. Ecchymosis and swelling over the great toe. Tenderness palpation of the first MTP joint. Neurovascular intact     ASSESSMENT & PLAN:   Great toe pain, left Injury occurred this past weekend concern for fracture given swelling and ecchymosis. -Counseled supportive care. -Postop shoe. -X-ray.

## 2019-10-06 NOTE — Patient Instructions (Signed)
Good to see you Please try ice  Please try the shoe   I will call with the results  Please send me a message in MyChart with any questions or updates.  Please see me back in 4 weeks.   --Dr. Raeford Razor

## 2019-10-07 ENCOUNTER — Telehealth: Payer: Self-pay | Admitting: Family Medicine

## 2019-10-07 ENCOUNTER — Other Ambulatory Visit: Payer: Self-pay | Admitting: Internal Medicine

## 2019-10-07 NOTE — Telephone Encounter (Signed)
Informed of results.   Rosemarie Ax, MD [facility] 10/07/2019, 1:24 PM

## 2019-10-13 ENCOUNTER — Other Ambulatory Visit: Payer: Self-pay | Admitting: Internal Medicine

## 2019-10-13 DIAGNOSIS — I10 Essential (primary) hypertension: Secondary | ICD-10-CM

## 2019-10-22 ENCOUNTER — Other Ambulatory Visit: Payer: Self-pay | Admitting: Internal Medicine

## 2019-10-22 DIAGNOSIS — Z9884 Bariatric surgery status: Secondary | ICD-10-CM

## 2019-10-22 DIAGNOSIS — E559 Vitamin D deficiency, unspecified: Secondary | ICD-10-CM

## 2019-10-22 DIAGNOSIS — G4701 Insomnia due to medical condition: Secondary | ICD-10-CM

## 2019-10-22 DIAGNOSIS — G8929 Other chronic pain: Secondary | ICD-10-CM

## 2019-10-28 ENCOUNTER — Other Ambulatory Visit: Payer: Self-pay | Admitting: Internal Medicine

## 2019-10-28 DIAGNOSIS — Z9884 Bariatric surgery status: Secondary | ICD-10-CM

## 2019-10-28 DIAGNOSIS — E60 Dietary zinc deficiency: Secondary | ICD-10-CM

## 2019-10-31 MED ORDER — CYANOCOBALAMIN 1000 MCG/ML IJ SOLN: 1000.0000 ug | INTRAMUSCULAR | 3 refills | Status: DC

## 2019-10-31 NOTE — Addendum Note (Signed)
Addended by: Aviva Signs M on: 10/31/2019 01:19 PM   Modules accepted: Orders

## 2019-11-11 ENCOUNTER — Ambulatory Visit: Payer: Medicare Other | Admitting: Family Medicine

## 2019-11-17 ENCOUNTER — Other Ambulatory Visit: Payer: Self-pay | Admitting: Internal Medicine

## 2019-11-17 DIAGNOSIS — I10 Essential (primary) hypertension: Secondary | ICD-10-CM

## 2019-11-18 ENCOUNTER — Other Ambulatory Visit: Payer: Self-pay | Admitting: Neurosurgery

## 2019-12-01 DIAGNOSIS — Z5181 Encounter for therapeutic drug level monitoring: Secondary | ICD-10-CM | POA: Diagnosis not present

## 2019-12-01 DIAGNOSIS — Z79899 Other long term (current) drug therapy: Secondary | ICD-10-CM | POA: Diagnosis not present

## 2019-12-01 DIAGNOSIS — M5416 Radiculopathy, lumbar region: Secondary | ICD-10-CM | POA: Diagnosis not present

## 2020-01-02 DIAGNOSIS — B360 Pityriasis versicolor: Secondary | ICD-10-CM | POA: Diagnosis not present

## 2020-01-02 DIAGNOSIS — L218 Other seborrheic dermatitis: Secondary | ICD-10-CM | POA: Diagnosis not present

## 2020-01-06 DIAGNOSIS — M4310 Spondylolisthesis, site unspecified: Secondary | ICD-10-CM | POA: Diagnosis not present

## 2020-01-08 NOTE — Pre-Procedure Instructions (Signed)
[name] - [place], [address] AT [address] [place] [address] Phone: [phone] Fax: [facility] #[id number] - [place], [place] - [address] AT [address] [place] [address] Phone: [phone] Fax: [phone]     Your procedure is scheduled on Tuesday November 9th.  Report to [facility] Main Entrance "A" at 6:00 A.M., and check in at the Admitting office.  Call this number if you have problems the morning of surgery:  [phone]  Call [phone] if you have any questions prior to your surgery date Monday-Friday 8am-4pm    Remember:  Do not eat or drink after midnight the night before your surgery    Take these medicines the morning of surgery with A SIP OF WATER  Oxycodone HCl if needed   As of today, STOP taking any Aspirin (unless otherwise instructed by your surgeon) Aleve, Naproxen, Ibuprofen, Motrin, Advil, Goody's, BC's, all herbal medications, fish oil, and all vitamins.                      Do not wear jewelry, make up, or nail polish            Do not wear lotions, powders, perfumes/colognes, or deodorant.            Do not shave 48 hours prior to surgery.  Men may shave face and neck.            Do not bring valuables to the hospital.            [facility] is not responsible for any belongings or valuables.  Do NOT Smoke (Tobacco/Vaping) or drink Alcohol 24 hours prior to your procedure If you use a CPAP at night, you may bring all equipment for your overnight stay.   Contacts, glasses, dentures or bridgework may not be worn into surgery.      For patients admitted to the hospital, discharge time will be determined by your treatment team.   Patients discharged the day of surgery will not be allowed to drive home, and someone needs to stay with them for 24 hours.    Special instructions:   [facility]- Preparing For  Surgery  Before surgery, you can play an important role. Because skin is not sterile, your skin needs to be as free of germs as possible. You can reduce the number of germs on your skin by washing with CHG (chlorahexidine gluconate) Soap before surgery.  CHG is an antiseptic cleaner which kills germs and bonds with the skin to continue killing germs even after washing.    Oral Hygiene is also important to reduce your risk of infection.  Remember - BRUSH YOUR TEETH THE MORNING OF SURGERY WITH YOUR REGULAR TOOTHPASTE  Please do not use if you have an allergy to CHG or antibacterial soaps. If your skin becomes reddened/irritated stop using the CHG.  Do not shave (including legs and underarms) for at least 48 hours prior to first CHG shower. It is OK to shave your face.  Please follow these instructions carefully.   1. Shower the NIGHT BEFORE SURGERY and the MORNING OF SURGERY with CHG Soap.   2. If you chose to wash your hair, wash your hair first as usual with your normal shampoo.  3. After you shampoo, rinse your hair and  body thoroughly to remove the shampoo.  4. Use CHG as you would any other liquid soap. You can apply CHG directly to the skin and wash gently with a scrungie or a clean washcloth.   5. Apply the CHG Soap to your body ONLY FROM THE NECK DOWN.  Do not use on open wounds or open sores. Avoid contact with your eyes, ears, mouth and genitals (private parts). Wash Face and genitals (private parts)  with your normal soap.   6. Wash thoroughly, paying special attention to the area where your surgery will be performed.  7. Thoroughly rinse your body with warm water from the neck down.  8. DO NOT shower/wash with your normal soap after using and rinsing off the CHG Soap.  9. Pat yourself dry with a CLEAN TOWEL.  10. Wear CLEAN PAJAMAS to bed the night before surgery  11. Place CLEAN SHEETS on your bed the night of your first shower and DO NOT SLEEP WITH PETS.   Day of  Surgery: Wear Clean/Comfortable clothing the morning of surgery Do not apply any deodorants/lotions.   Remember to brush your teeth WITH YOUR REGULAR TOOTHPASTE.   Please read over the following fact sheets that you were given.

## 2020-01-09 ENCOUNTER — Other Ambulatory Visit (HOSPITAL_COMMUNITY)
Admission: RE | Admit: 2020-01-09 | Discharge: 2020-01-09 | Disposition: A | Discharge status: Home / Self Care | Payer: Medicare Other | Source: Ambulatory Visit | Attending: Neurosurgery | Admitting: Neurosurgery

## 2020-01-09 ENCOUNTER — Other Ambulatory Visit: Payer: Self-pay

## 2020-01-09 ENCOUNTER — Encounter (HOSPITAL_COMMUNITY): Payer: Self-pay

## 2020-01-09 ENCOUNTER — Encounter (HOSPITAL_COMMUNITY)
Admission: RE | Admit: 2020-01-09 | Discharge: 2020-01-09 | Disposition: A | Discharge status: Home / Self Care | Payer: Medicare Other | Source: Ambulatory Visit | Attending: Neurosurgery | Admitting: Neurosurgery

## 2020-01-09 DIAGNOSIS — Z20822 Contact with and (suspected) exposure to covid-19: Secondary | ICD-10-CM | POA: Insufficient documentation

## 2020-01-09 DIAGNOSIS — R011 Cardiac murmur, unspecified: Secondary | ICD-10-CM | POA: Insufficient documentation

## 2020-01-09 DIAGNOSIS — I10 Essential (primary) hypertension: Secondary | ICD-10-CM | POA: Insufficient documentation

## 2020-01-09 DIAGNOSIS — Z01812 Encounter for preprocedural laboratory examination: Secondary | ICD-10-CM | POA: Insufficient documentation

## 2020-01-09 DIAGNOSIS — D509 Iron deficiency anemia, unspecified: Secondary | ICD-10-CM | POA: Insufficient documentation

## 2020-01-09 DIAGNOSIS — Z9884 Bariatric surgery status: Secondary | ICD-10-CM | POA: Insufficient documentation

## 2020-01-09 DIAGNOSIS — Z981 Arthrodesis status: Secondary | ICD-10-CM | POA: Diagnosis not present

## 2020-01-09 DIAGNOSIS — Z79899 Other long term (current) drug therapy: Secondary | ICD-10-CM | POA: Diagnosis not present

## 2020-01-09 DIAGNOSIS — M4316 Spondylolisthesis, lumbar region: Secondary | ICD-10-CM | POA: Diagnosis not present

## 2020-01-09 DIAGNOSIS — Z792 Long term (current) use of antibiotics: Secondary | ICD-10-CM | POA: Diagnosis not present

## 2020-01-09 DIAGNOSIS — Z96651 Presence of right artificial knee joint: Secondary | ICD-10-CM | POA: Insufficient documentation

## 2020-01-09 DIAGNOSIS — I2510 Atherosclerotic heart disease of native coronary artery without angina pectoris: Secondary | ICD-10-CM | POA: Diagnosis not present

## 2020-01-09 LAB — CBC WITH DIFFERENTIAL/PLATELET
Abs Immature Granulocytes: 0.02 10*3/uL (ref 0.00–0.07)
Basophils Absolute: 0 10*3/uL (ref 0.0–0.1)
Basophils Relative: 1 %
Eosinophils Absolute: 0.1 10*3/uL (ref 0.0–0.5)
Eosinophils Relative: 1 %
HCT: 31.4 % — ABNORMAL LOW (ref 39.0–52.0)
Hemoglobin: 9.8 g/dL — ABNORMAL LOW (ref 13.0–17.0)
Immature Granulocytes: 0 %
Lymphocytes Relative: 23 %
Lymphs Abs: 2 10*3/uL (ref 0.7–4.0)
MCH: 21.9 pg — ABNORMAL LOW (ref 26.0–34.0)
MCHC: 31.2 g/dL (ref 30.0–36.0)
MCV: 70.2 fL — ABNORMAL LOW (ref 80.0–100.0)
Monocytes Absolute: 0.9 10*3/uL (ref 0.1–1.0)
Monocytes Relative: 11 %
Neutro Abs: 5.8 10*3/uL (ref 1.7–7.7)
Neutrophils Relative %: 64 %
Platelets: 318 10*3/uL (ref 150–400)
RBC: 4.47 MIL/uL (ref 4.22–5.81)
RDW: 16.4 % — ABNORMAL HIGH (ref 11.5–15.5)
WBC: 8.9 10*3/uL (ref 4.0–10.5)
nRBC: 0 % (ref 0.0–0.2)

## 2020-01-09 LAB — BASIC METABOLIC PANEL
Anion gap: 10 (ref 5–15)
BUN: 12 mg/dL (ref 6–20)
CO2: 24 mmol/L (ref 22–32)
Calcium: 8.6 mg/dL — ABNORMAL LOW (ref 8.9–10.3)
Chloride: 93 mmol/L — ABNORMAL LOW (ref 98–111)
Creatinine, Ser: 0.99 mg/dL (ref 0.61–1.24)
GFR, Estimated: >60 mL/min (ref >60)
Glucose, Bld: 78 mg/dL (ref 70–99)
Potassium: 4.3 mmol/L (ref 3.5–5.1)
Sodium: 127 mmol/L — ABNORMAL LOW (ref 135–145)

## 2020-01-09 LAB — SURGICAL PCR SCREEN
MRSA, PCR: NEGATIVE
Staphylococcus aureus: NEGATIVE

## 2020-01-09 LAB — SARS CORONAVIRUS 2 (TAT 6-24 HRS): SARS Coronavirus 2: NEGATIVE

## 2020-01-09 NOTE — Progress Notes (Addendum)
PCP:  [name], MD Cardiologist:  [name], MD  EKG:  04/22/19 CXR:  N/A ECHO:  06/16/19 Stress Test:  Denies Cardiac Cath:  Denies  Fasting Blood Sugar-  N/A Checks Blood Sugar__N/a_ times a day  ASA/Blood thinners:  NO  Covid test 01/09/20  Anesthesia Review:  Yes, followed by cards for murmur.  History states CAD, although patient denies this .  Abnormal labs  Patient denies shortness of breath, fever, cough, and chest pain at PAT appointment.  Patient verbalized understanding of instructions provided today at the PAT appointment.  Patient asked to review instructions at home and day of surgery.

## 2020-01-12 ENCOUNTER — Encounter (HOSPITAL_COMMUNITY): Payer: Self-pay

## 2020-01-12 NOTE — Anesthesia Preprocedure Evaluation (Addendum)
Anesthesia Evaluation  Patient identified by MRN, date of birth, ID band Patient awake    Reviewed: Allergy & Precautions, H&P , NPO status , Patient's Chart, lab work & pertinent test results, reviewed documented beta blocker date and time   Airway Mallampati: I  TM Distance: >3 FB Neck ROM: full    Dental no notable dental hx. (+) Edentulous Upper, Edentulous Lower   Pulmonary asthma ,    Pulmonary exam normal breath sounds clear to auscultation       Cardiovascular Exercise Tolerance: Good hypertension, Pt. on medications + CAD  + Valvular Problems/Murmurs AS  Rhythm:Regular Rate:Normal + Systolic murmurs ECHO 6/96 1. Left ventricular ejection fraction, by estimation, is 55 to 60%. The  left ventricle has normal function. The left ventricle has no regional  wall motion abnormalities. There is mild left ventricular hypertrophy.  Left ventricular diastolic parameters  were normal.  2. Right ventricular systolic function is normal. The right ventricular  size is normal. There is mildly elevated pulmonary artery systolic  pressure.  3. The mitral valve is grossly normal. Mild mitral valve regurgitation.  4. AV is thickened, calcified with very mildly restricted motion. Peak  and mean gradients thruogh the valve are 23 and 11 mm Hg respectively  consistent with mild AS.Marland Kitchen The aortic valve is tricuspid. Aortic valve  regurgitation is not visualized.     Neuro/Psych PSYCHIATRIC DISORDERS Anxiety Depression negative neurological ROS     GI/Hepatic hiatal hernia, GERD  Medicated and Controlled,EGD 05/03/18: IMPRESSION: - LA Grade A reflux esophagitis. - Gastric bypass with a medium-sized pouch and intact staple line. Gastrojejunal anastomosis characterized by erosion. Mild gastritis with erosions and adherent heme.    Endo/Other  negative endocrine ROS  Renal/GU negative Renal ROS  negative genitourinary    Musculoskeletal  (+) Arthritis , Osteoarthritis,    Abdominal   Peds  Hematology  (+) Blood dyscrasia, anemia ,   Anesthesia Other Findings   Reproductive/Obstetrics negative OB ROS                           Anesthesia Physical Anesthesia Plan  ASA: III  Anesthesia Plan: General   Post-op Pain Management:    Induction: Intravenous and Cricoid pressure planned  PONV Risk Score and Plan: 2 and Ondansetron and Treatment may vary due to age or medical condition  Airway Management Planned: Oral ETT  Additional Equipment:   Intra-op Plan:   Post-operative Plan: Extubation in OR  Informed Consent: I have reviewed the patients History and Physical, chart, labs and discussed the procedure including the risks, benefits and alternatives for the proposed anesthesia with the patient or authorized representative who has indicated his/her understanding and acceptance.     Dental Advisory Given  Plan Discussed with: CRNA and Anesthesiologist  Anesthesia Plan Comments: (See PAT note written 01/12/2020 by Myra Gianotti, PA-C. For iSTAT day of surgery to recheck sodium.  )      Anesthesia Quick Evaluation

## 2020-01-12 NOTE — Progress Notes (Signed)
Anesthesia Chart Review:  Case: [id number] Date/Time: 01/13/20 0745   Procedure: XLIF - right - L1-L2 with perc screws (Right ) - 3C   Anesthesia type: General   Pre-op diagnosis: Spondylolisthesis   Location: [facility] OR ROOM 21 / [place] [facility]   Surgeons: Earnie Larsson, MD      DISCUSSION: Patient is a 49 year old male scheduled for the above procedure.  History includes never smoker, CAD (faint calcifications LAD, CX 2016 Chest CT), HTN, heart murmur (mild MR, mild AS 06/2019 echo), anemia, prior drug abuse, GERD, priapism (recurrent, 2013, 2015), back surgery (left T12-L1 microdiscectomy 07/06/10; L2-3 anterolateral fusion, posterior screws 02/24/16 ), right TKA (04/26/17).  Last cardiology visit with Dr. Harrington Challenger on 05/29/19 with one year follow-up. No ischemic symptoms. 06/2019 echo showed normal LVEF with no regional wall motion abnormalities, mild MR/AS.  Preoperative labs show H/H 9.8/31.4, up from 9.2/30.2 on 04/28/19 and had iron studies at that time that showed iron low at 11 (42-165), transferrin normal at 318, saturation ratios low at 2.5% (20%-50%), ferritin low at 2.6 (22-322)--iron transfusion was ordered by his PCP Dr. Ronnald Ramp and received ferric carboxymaltose 750 mg IV 05/08/19. Vitamin D and zinc oral supplementation also recommended. Last EGD and colonoscopy 05/03/18 by Harl Bowie, MD; however, colonoscopy was incomplete due to inadequate prep, so repeat colonoscopy recommended. He is believed to have IDA secondary to gastric bypass. According to his medication list, he is not currently taking iron supplements. T&S done.  Since HGB < 10 will order PRBC but defer decision for perioperative transfusion to surgeon and/or anesthesiologist. Sodium newly decreased at 127, so he will need this repeated on the day of surgery to ensure stability. Contacted Vanessa at Dr. Marchelle Folks office about low H/H and sodium, and she will have him review for any additional recommendations.  01/09/2020 presurgical COVID-19 test  negative.  Anesthesia team to evaluate on the day of surgery and review follow-up same day (to reevaluate for worsening hyponatremia) lab results.    VS: BP 132/88   Pulse 70   Temp 36.4 C (Oral)   Resp 18   Ht 5' 10"  (1.778 m)   Wt 92.9 kg   SpO2 100%   BMI 29.39 kg/m     PROVIDERS: [name], MD is PCP  [name], MD is cardiologist. Last visit 05/29/19. Known coronary calcifications with no symptoms of ischemic/angina at that time. Echo ordered to re-evaluate murmur and showed normal LVEF, mild MR/AS. One year follow-up recommended.     LABS: Preoperative labs noted. See DISCUSSION.  (all labs ordered are listed, but only abnormal results are displayed)  Labs Reviewed  CBC WITH DIFFERENTIAL/PLATELET - Abnormal; Notable for the following components:      Result Value   Hemoglobin 9.8 (*)    HCT 31.4 (*)    MCV 70.2 (*)    MCH 21.9 (*)    RDW 16.4 (*)    All other components within normal limits  BASIC METABOLIC PANEL - Abnormal; Notable for the following components:   Sodium 127 (*)    Chloride 93 (*)    Calcium 8.6 (*)    All other components within normal limits  SURGICAL PCR SCREEN  TYPE AND SCREEN    OTHER: EGD 05/03/18: IMPRESSION: - LA Grade A reflux esophagitis. - Gastric bypass with a medium-sized pouch and intact staple line. Gastrojejunal anastomosis characterized by erosion. Mild gastritis with erosions and adherent heme. Biopsied. [non-specific reactive gastropathy; negative for H. Pylori] - Normal examined jejunum.  Colonoscopy 05/03/18: IMPRESSION: - Preparation of the colon was inadequate. - Stool in the rectum, in the sigmoid colon and in the descending colon.- No specimens collected. Recommendation: - For future colonoscopy the patient will require an extended preparation. If there are any questions, please contact the gastroenterologist. - Repeat colonoscopy at the next available appointment with Dr Rush Landmark because the examination was  incomplete.   EKG: 04/22/19: NSR   CV: Echo 06/16/19: IMPRESSIONS  1. Left ventricular ejection fraction, by estimation, is 55 to 60%. The  left ventricle has normal function. The left ventricle has no regional  wall motion abnormalities. There is mild left ventricular hypertrophy.  Left ventricular diastolic parameters  were normal.  2. Right ventricular systolic function is normal. The right ventricular  size is normal. There is mildly elevated pulmonary artery systolic  pressure.  3. The mitral valve is grossly normal. Mild mitral valve regurgitation.  4. AV is thickened, calcified with very mildly restricted motion. Peak  and mean gradients thruogh the valve are 23 and 11 mm Hg respectively  consistent with mild AS.Marland Kitchen The aortic valve is tricuspid. Aortic valve  regurgitation is not visualized.  5. The inferior vena cava is normal in size with greater than 50%  respiratory variability, suggesting right atrial pressure of 3 mmHg.    Past Medical History:  Diagnosis Date  . Allergy   . Anemia    since gastric bypass  . Anxiety   . Aortic valve sclerosis   . Arthritis   . Arthropathy, unspecified, other specified sites   . CAD (coronary artery disease)    01/09/20: patient denied; faint coronary calcifications on 2016 CT  . Contact with or exposure to venereal diseases   . Depression   . Displacement of intervertebral disc, site unspecified, without myelopathy   . GERD (gastroesophageal reflux disease)   . Heart murmur    mild MR, mild AS 06/2019 (Dr. [name])  . History of kidney stones   . Hypertension     " no longer have hypertension since gastric bypass "  . Lumbago   . Obesity, unspecified   . Other, mixed, or unspecified nondependent drug abuse, unspecified   . Personal history of urinary calculi   . Pneumonia   . Priapism   . Stenosis, spinal, lumbar     Past Surgical History:  Procedure Laterality Date  . ABDOMINAL SURGERY    . ANKLE SURGERY     . ANTERIOR LAT LUMBAR FUSION Left 02/24/2016   Procedure: LEFT LUMBAR TWO-THREE  ANTERIOR LATERAL LUMBAR FUSION  ;  Surgeon: Earnie Larsson, MD;  Location: [facility];  Service: Neurosurgery;  Laterality: Left;  . BACK SURGERY    . GASTRIC BYPASS    . GASTRIC BYPASS  2008  . HERNIA REPAIR    . INCISION AND DRAINAGE ABSCESS Right 07/06/2004   great toe  . IRRIGATION AND DEBRIDEMENT ABSCESS Right 07/08/2004   great toe  . KNEE ARTHROPLASTY Right 04/26/2017   Procedure: RIGHT TOTAL KNEE ARTHROPLASTY WITH COMPUTER NAVIGATION;  Surgeon: Rod Can, MD;  Location: [facility];  Service: Orthopedics;  Laterality: Right;  Needs RNFA  . KNEE SURGERY    . LUMBAR PERCUTANEOUS PEDICLE SCREW 1 LEVEL Left 02/24/2016   Procedure: LUMBAR PERCUTANEOUS PEDICLE SCREW 1 LEVEL;  Surgeon: Earnie Larsson, MD;  Location: [place];  Service: Neurosurgery;  Laterality: Left;  Marland Kitchen MICRODISCECTOMY LUMBAR Left 08/24/2008   T12 - L1  . panectomy    . TOTAL KNEE ARTHROPLASTY Right 04/2017  MEDICATIONS: . amoxicillin (AMOXIL) 875 MG tablet  . Cholecalciferol (VITAMIN D3) 50 MCG (2000 UT) TABS  . cyanocobalamin (,VITAMIN B-12,) 1000 MCG/ML injection  . eszopiclone (LUNESTA) 2 MG TABS tablet  . famotidine (PEPCID) 40 MG tablet  . ferrous sulfate 325 (65 FE) MG tablet  . indapamide (LOZOL) 1.25 MG tablet  . INS SYRINGE/NEEDLE 1CC/28G 28G X 1/2" 1 ML MISC  . irbesartan (AVAPRO) 150 MG tablet  . Multiple Vitamin (MULTIVITAMIN) tablet  . nitroGLYCERIN (NITRODUR - DOSED IN MG/24 HR) 0.2 mg/hr patch  . omeprazole (PRILOSEC) 40 MG capsule  . Oxycodone HCl 10 MG TABS  . sildenafil (VIAGRA) 100 MG tablet  . thiamine (VITAMIN B-1) 100 MG tablet  . triamcinolone (NASACORT) 55 MCG/ACT AERO nasal inhaler  . zinc gluconate 50 MG tablet   No current facility-administered medications for this encounter.  He has not currently taking amoxicillin, Lunesta, Pepcid, ferrous sulfate, nitroglycerin.   Myra Gianotti, PA-C Surgical Short  Stay/Anesthesiology [facility] Phone [phone] [facility] Phone [phone] 01/12/2020 10:57 AM

## 2020-01-13 ENCOUNTER — Ambulatory Visit (HOSPITAL_COMMUNITY): Payer: Medicare Other | Admitting: Vascular Surgery

## 2020-01-13 ENCOUNTER — Ambulatory Visit (HOSPITAL_COMMUNITY): Payer: Medicare Other

## 2020-01-13 ENCOUNTER — Encounter (HOSPITAL_COMMUNITY): Payer: Self-pay | Admitting: Neurosurgery

## 2020-01-13 ENCOUNTER — Encounter (HOSPITAL_COMMUNITY)
Admission: RE | Disposition: A | Discharge status: Home / Self Care | Payer: Self-pay | Source: Ambulatory Visit | Attending: Neurosurgery

## 2020-01-13 ENCOUNTER — Observation Stay (HOSPITAL_COMMUNITY)
Admission: RE | Admit: 2020-01-13 | Discharge: 2020-01-14 | Disposition: A | Discharge status: Home / Self Care | Payer: Medicare Other | Source: Ambulatory Visit | Attending: Neurosurgery | Admitting: Neurosurgery

## 2020-01-13 ENCOUNTER — Other Ambulatory Visit: Payer: Self-pay

## 2020-01-13 DIAGNOSIS — Z981 Arthrodesis status: Secondary | ICD-10-CM | POA: Diagnosis not present

## 2020-01-13 DIAGNOSIS — I2510 Atherosclerotic heart disease of native coronary artery without angina pectoris: Secondary | ICD-10-CM | POA: Insufficient documentation

## 2020-01-13 DIAGNOSIS — M4316 Spondylolisthesis, lumbar region: Secondary | ICD-10-CM | POA: Diagnosis not present

## 2020-01-13 DIAGNOSIS — Z79899 Other long term (current) drug therapy: Secondary | ICD-10-CM | POA: Diagnosis not present

## 2020-01-13 DIAGNOSIS — M4310 Spondylolisthesis, site unspecified: Secondary | ICD-10-CM | POA: Diagnosis present

## 2020-01-13 DIAGNOSIS — M4326 Fusion of spine, lumbar region: Secondary | ICD-10-CM | POA: Diagnosis not present

## 2020-01-13 DIAGNOSIS — Z96651 Presence of right artificial knee joint: Secondary | ICD-10-CM | POA: Insufficient documentation

## 2020-01-13 DIAGNOSIS — M549 Dorsalgia, unspecified: Secondary | ICD-10-CM | POA: Diagnosis present

## 2020-01-13 DIAGNOSIS — I10 Essential (primary) hypertension: Secondary | ICD-10-CM | POA: Insufficient documentation

## 2020-01-13 DIAGNOSIS — Z419 Encounter for procedure for purposes other than remedying health state, unspecified: Secondary | ICD-10-CM

## 2020-01-13 DIAGNOSIS — J45909 Unspecified asthma, uncomplicated: Secondary | ICD-10-CM | POA: Diagnosis not present

## 2020-01-13 HISTORY — PX: ANTERIOR LATERAL LUMBAR FUSION WITH PERCUTANEOUS SCREW 1 LEVEL: SHX5553

## 2020-01-13 LAB — POCT I-STAT, CHEM 8
BUN: 10 mg/dL (ref 6–20)
Calcium, Ion: 1.14 mmol/L — ABNORMAL LOW (ref 1.15–1.40)
Chloride: 94 mmol/L — ABNORMAL LOW (ref 98–111)
Creatinine, Ser: 1.1 mg/dL (ref 0.61–1.24)
Glucose, Bld: 97 mg/dL (ref 70–99)
HCT: 32 % — ABNORMAL LOW (ref 39.0–52.0)
Hemoglobin: 10.9 g/dL — ABNORMAL LOW (ref 13.0–17.0)
Potassium: 4.1 mmol/L (ref 3.5–5.1)
Sodium: 129 mmol/L — ABNORMAL LOW (ref 135–145)
TCO2: 26 mmol/L (ref 22–32)

## 2020-01-13 LAB — PREPARE RBC (CROSSMATCH)

## 2020-01-13 SURGERY — ANTERIOR LATERAL LUMBAR FUSION WITH PERCUTANEOUS SCREW 1 LEVEL
Anesthesia: General | Site: Spine Lumbar | Laterality: Right

## 2020-01-13 MED ORDER — ACETAMINOPHEN 10 MG/ML IV SOLN
INTRAVENOUS | Status: DC | PRN
  Administered 2020-01-13: 1000 mg via INTRAVENOUS

## 2020-01-13 MED ORDER — OXYCODONE HCL 5 MG/5ML PO SOLN: 5.0000 mg | Freq: Once | ORAL | Status: DC | PRN

## 2020-01-13 MED ORDER — HEMOSTATIC AGENTS (NO CHARGE) OPTIME
TOPICAL | Status: DC | PRN
  Administered 2020-01-13: 1 via TOPICAL

## 2020-01-13 MED ORDER — FENTANYL CITRATE (PF) 250 MCG/5ML IJ SOLN
INTRAMUSCULAR | Status: AC
  Filled 2020-01-13: qty 5

## 2020-01-13 MED ORDER — THROMBIN 5000 UNITS EX SOLR
CUTANEOUS | Status: DC | PRN
  Administered 2020-01-13 (×2): 5000 [IU] via TOPICAL

## 2020-01-13 MED ORDER — ACETAMINOPHEN 160 MG/5ML PO SOLN: 325.0000 mg | ORAL | Status: DC | PRN

## 2020-01-13 MED ORDER — OXYCODONE HCL 5 MG PO TABS
10.0000 mg | ORAL_TABLET | ORAL | Status: DC | PRN
  Administered 2020-01-13 (×2): 10 mg via ORAL
  Filled 2020-01-13: qty 2

## 2020-01-13 MED ORDER — INDAPAMIDE 1.25 MG PO TABS
1.2500 mg | ORAL_TABLET | Freq: Every day | ORAL | Status: DC
  Administered 2020-01-13: 1.25 mg via ORAL
  Filled 2020-01-13 (×2): qty 1

## 2020-01-13 MED ORDER — KETOROLAC TROMETHAMINE 30 MG/ML IJ SOLN
30.0000 mg | Freq: Four times a day (QID) | INTRAMUSCULAR | Status: DC | PRN
  Administered 2020-01-13 – 2020-01-14 (×3): 30 mg via INTRAVENOUS
  Filled 2020-01-13 (×3): qty 1

## 2020-01-13 MED ORDER — DIAZEPAM 5 MG PO TABS
5.0000 mg | ORAL_TABLET | Freq: Four times a day (QID) | ORAL | Status: DC | PRN
  Administered 2020-01-13 – 2020-01-14 (×4): 10 mg via ORAL
  Filled 2020-01-13 (×3): qty 2

## 2020-01-13 MED ORDER — ONDANSETRON HCL 4 MG/2ML IJ SOLN: 4.0000 mg | Freq: Once | INTRAMUSCULAR | Status: DC | PRN

## 2020-01-13 MED ORDER — OXYCODONE HCL 5 MG PO TABS: 5.0000 mg | ORAL_TABLET | Freq: Once | ORAL | Status: DC | PRN

## 2020-01-13 MED ORDER — PHENOL 1.4 % MT LIQD: 1.0000 | OROMUCOSAL | Status: DC | PRN

## 2020-01-13 MED ORDER — BUPIVACAINE HCL (PF) 0.25 % IJ SOLN
INTRAMUSCULAR | Status: AC
  Filled 2020-01-13: qty 30

## 2020-01-13 MED ORDER — FLEET ENEMA 7-19 GM/118ML RE ENEM: 1.0000 | ENEMA | Freq: Once | RECTAL | Status: DC | PRN

## 2020-01-13 MED ORDER — FENTANYL CITRATE (PF) 250 MCG/5ML IJ SOLN
INTRAMUSCULAR | Status: DC | PRN
  Administered 2020-01-13 (×2): 100 ug via INTRAVENOUS
  Administered 2020-01-13: 50 ug via INTRAVENOUS

## 2020-01-13 MED ORDER — PROPOFOL 10 MG/ML IV BOLUS
INTRAVENOUS | Status: AC
  Filled 2020-01-13: qty 20

## 2020-01-13 MED ORDER — ONDANSETRON HCL 4 MG/2ML IJ SOLN
INTRAMUSCULAR | Status: DC | PRN
  Administered 2020-01-13: 4 mg via INTRAVENOUS

## 2020-01-13 MED ORDER — ADULT MULTIVITAMIN W/MINERALS CH
1.0000 | ORAL_TABLET | Freq: Every day | ORAL | Status: DC
  Administered 2020-01-13 – 2020-01-14 (×2): 1 via ORAL
  Filled 2020-01-13 (×2): qty 1

## 2020-01-13 MED ORDER — HYDROCODONE-ACETAMINOPHEN 10-325 MG PO TABS
1.0000 | ORAL_TABLET | ORAL | Status: DC | PRN
  Administered 2020-01-13 – 2020-01-14 (×5): 1 via ORAL
  Filled 2020-01-13 (×5): qty 1

## 2020-01-13 MED ORDER — EPHEDRINE SULFATE-NACL 50-0.9 MG/10ML-% IV SOSY
PREFILLED_SYRINGE | INTRAVENOUS | Status: DC | PRN
  Administered 2020-01-13: 10 mg via INTRAVENOUS

## 2020-01-13 MED ORDER — THROMBIN 5000 UNITS EX SOLR
CUTANEOUS | Status: AC
  Filled 2020-01-13: qty 10000

## 2020-01-13 MED ORDER — DEXAMETHASONE SODIUM PHOSPHATE 10 MG/ML IJ SOLN: 10.0000 mg | Freq: Once | INTRAMUSCULAR | Status: DC

## 2020-01-13 MED ORDER — PHENYLEPHRINE 40 MCG/ML (10ML) SYRINGE FOR IV PUSH (FOR BLOOD PRESSURE SUPPORT)
PREFILLED_SYRINGE | INTRAVENOUS | Status: DC | PRN
  Administered 2020-01-13: 40 ug via INTRAVENOUS
  Administered 2020-01-13: 120 ug via INTRAVENOUS

## 2020-01-13 MED ORDER — DEXAMETHASONE SODIUM PHOSPHATE 10 MG/ML IJ SOLN
INTRAMUSCULAR | Status: DC | PRN
  Administered 2020-01-13: 10 mg via INTRAVENOUS

## 2020-01-13 MED ORDER — KETAMINE HCL 50 MG/5ML IJ SOSY
PREFILLED_SYRINGE | INTRAMUSCULAR | Status: AC
  Filled 2020-01-13: qty 10

## 2020-01-13 MED ORDER — VITAMIN D 25 MCG (1000 UNIT) PO TABS
4000.0000 [IU] | ORAL_TABLET | Freq: Every day | ORAL | Status: DC
  Administered 2020-01-14: 4000 [IU] via ORAL
  Filled 2020-01-13 (×2): qty 4

## 2020-01-13 MED ORDER — HYDROMORPHONE HCL 1 MG/ML IJ SOLN
0.2500 mg | INTRAMUSCULAR | Status: DC | PRN
  Administered 2020-01-13 (×4): 0.5 mg via INTRAVENOUS

## 2020-01-13 MED ORDER — OXYCODONE HCL 5 MG PO TABS
ORAL_TABLET | ORAL | Status: AC
  Filled 2020-01-13: qty 2

## 2020-01-13 MED ORDER — DIAZEPAM 5 MG PO TABS
ORAL_TABLET | ORAL | Status: AC
  Filled 2020-01-13: qty 2

## 2020-01-13 MED ORDER — FENTANYL CITRATE (PF) 100 MCG/2ML IJ SOLN
INTRAMUSCULAR | Status: AC
  Filled 2020-01-13: qty 2

## 2020-01-13 MED ORDER — CEFAZOLIN SODIUM-DEXTROSE 1-4 GM/50ML-% IV SOLN
1.0000 g | Freq: Three times a day (TID) | INTRAVENOUS | Status: AC
  Administered 2020-01-13 – 2020-01-14 (×2): 1 g via INTRAVENOUS
  Filled 2020-01-13 (×2): qty 50

## 2020-01-13 MED ORDER — ACETAMINOPHEN 325 MG PO TABS: 650.0000 mg | ORAL_TABLET | ORAL | Status: DC | PRN

## 2020-01-13 MED ORDER — HYDROMORPHONE HCL 1 MG/ML IJ SOLN
1.0000 mg | INTRAMUSCULAR | Status: DC | PRN
  Administered 2020-01-13: 1 mg via INTRAVENOUS
  Filled 2020-01-13: qty 1

## 2020-01-13 MED ORDER — ROCURONIUM BROMIDE 10 MG/ML (PF) SYRINGE
PREFILLED_SYRINGE | INTRAVENOUS | Status: AC
  Filled 2020-01-13: qty 10

## 2020-01-13 MED ORDER — ZINC GLUCONATE 50 MG PO TABS: 50.0000 mg | ORAL_TABLET | Freq: Every day | ORAL | Status: DC

## 2020-01-13 MED ORDER — ORAL CARE MOUTH RINSE: 15.0000 mL | Freq: Once | OROMUCOSAL | Status: AC

## 2020-01-13 MED ORDER — MIDAZOLAM HCL 2 MG/2ML IJ SOLN
INTRAMUSCULAR | Status: DC | PRN
  Administered 2020-01-13: 2 mg via INTRAVENOUS

## 2020-01-13 MED ORDER — DEXAMETHASONE SODIUM PHOSPHATE 10 MG/ML IJ SOLN
INTRAMUSCULAR | Status: AC
  Filled 2020-01-13: qty 1

## 2020-01-13 MED ORDER — CHLORHEXIDINE GLUCONATE 0.12 % MT SOLN
15.0000 mL | Freq: Once | OROMUCOSAL | Status: AC
  Administered 2020-01-13: 15 mL via OROMUCOSAL

## 2020-01-13 MED ORDER — 0.9 % SODIUM CHLORIDE (POUR BTL) OPTIME
TOPICAL | Status: DC | PRN
  Administered 2020-01-13: 1000 mL

## 2020-01-13 MED ORDER — MENTHOL 3 MG MT LOZG: 1.0000 | LOZENGE | OROMUCOSAL | Status: DC | PRN

## 2020-01-13 MED ORDER — PROPOFOL 1000 MG/100ML IV EMUL
INTRAVENOUS | Status: AC
  Filled 2020-01-13: qty 200

## 2020-01-13 MED ORDER — CHLORHEXIDINE GLUCONATE CLOTH 2 % EX PADS: 6.0000 | MEDICATED_PAD | Freq: Once | CUTANEOUS | Status: DC

## 2020-01-13 MED ORDER — MIDAZOLAM HCL 2 MG/2ML IJ SOLN
INTRAMUSCULAR | Status: AC
  Filled 2020-01-13: qty 2

## 2020-01-13 MED ORDER — POLYETHYLENE GLYCOL 3350 17 G PO PACK: 17.0000 g | PACK | Freq: Every day | ORAL | Status: DC | PRN

## 2020-01-13 MED ORDER — SODIUM CHLORIDE 0.9 % IV SOLN: 250.0000 mL | INTRAVENOUS | Status: DC

## 2020-01-13 MED ORDER — CHLORHEXIDINE GLUCONATE 0.12 % MT SOLN
OROMUCOSAL | Status: AC
  Filled 2020-01-13: qty 15

## 2020-01-13 MED ORDER — PROPOFOL 10 MG/ML IV BOLUS
INTRAVENOUS | Status: DC | PRN
  Administered 2020-01-13: 200 mg via INTRAVENOUS

## 2020-01-13 MED ORDER — FENTANYL CITRATE (PF) 100 MCG/2ML IJ SOLN
25.0000 ug | INTRAMUSCULAR | Status: DC | PRN
  Administered 2020-01-13 (×3): 50 ug via INTRAVENOUS

## 2020-01-13 MED ORDER — BISACODYL 10 MG RE SUPP: 10.0000 mg | Freq: Every day | RECTAL | Status: DC | PRN

## 2020-01-13 MED ORDER — HYDROMORPHONE HCL 1 MG/ML IJ SOLN
INTRAMUSCULAR | Status: AC
  Filled 2020-01-13: qty 2

## 2020-01-13 MED ORDER — ACETAMINOPHEN 10 MG/ML IV SOLN
INTRAVENOUS | Status: AC
  Filled 2020-01-13: qty 100

## 2020-01-13 MED ORDER — CYANOCOBALAMIN 1000 MCG/ML IJ SOLN
1000.0000 ug | INTRAMUSCULAR | Status: DC
  Filled 2020-01-13: qty 1

## 2020-01-13 MED ORDER — ONDANSETRON HCL 4 MG/2ML IJ SOLN
INTRAMUSCULAR | Status: AC
  Filled 2020-01-13: qty 2

## 2020-01-13 MED ORDER — SUCCINYLCHOLINE CHLORIDE 20 MG/ML IJ SOLN
INTRAMUSCULAR | Status: DC | PRN
  Administered 2020-01-13: 160 mg via INTRAVENOUS

## 2020-01-13 MED ORDER — ONDANSETRON HCL 4 MG/2ML IJ SOLN: 4.0000 mg | Freq: Four times a day (QID) | INTRAMUSCULAR | Status: DC | PRN

## 2020-01-13 MED ORDER — ONDANSETRON HCL 4 MG PO TABS: 4.0000 mg | ORAL_TABLET | Freq: Four times a day (QID) | ORAL | Status: DC | PRN

## 2020-01-13 MED ORDER — EPHEDRINE 5 MG/ML INJ
INTRAVENOUS | Status: AC
  Filled 2020-01-13: qty 10

## 2020-01-13 MED ORDER — IRBESARTAN 150 MG PO TABS
150.0000 mg | ORAL_TABLET | Freq: Every day | ORAL | Status: DC
  Administered 2020-01-13 – 2020-01-14 (×2): 150 mg via ORAL
  Filled 2020-01-13 (×2): qty 1

## 2020-01-13 MED ORDER — SODIUM CHLORIDE 0.9% FLUSH
3.0000 mL | Freq: Two times a day (BID) | INTRAVENOUS | Status: DC
  Administered 2020-01-14: 3 mL via INTRAVENOUS

## 2020-01-13 MED ORDER — SODIUM CHLORIDE 0.9% FLUSH: 3.0000 mL | INTRAVENOUS | Status: DC | PRN

## 2020-01-13 MED ORDER — MEPERIDINE HCL 25 MG/ML IJ SOLN: 6.2500 mg | INTRAMUSCULAR | Status: DC | PRN

## 2020-01-13 MED ORDER — LACTATED RINGERS IV SOLN: INTRAVENOUS | Status: DC

## 2020-01-13 MED ORDER — KETAMINE HCL 10 MG/ML IJ SOLN
INTRAMUSCULAR | Status: DC | PRN
  Administered 2020-01-13: 10 mg via INTRAVENOUS
  Administered 2020-01-13: 30 mg via INTRAVENOUS

## 2020-01-13 MED ORDER — LIDOCAINE 2% (20 MG/ML) 5 ML SYRINGE
INTRAMUSCULAR | Status: DC | PRN
  Administered 2020-01-13: 100 mg via INTRAVENOUS

## 2020-01-13 MED ORDER — PHENYLEPHRINE 40 MCG/ML (10ML) SYRINGE FOR IV PUSH (FOR BLOOD PRESSURE SUPPORT)
PREFILLED_SYRINGE | INTRAVENOUS | Status: AC
  Filled 2020-01-13: qty 10

## 2020-01-13 MED ORDER — LIDOCAINE 2% (20 MG/ML) 5 ML SYRINGE
INTRAMUSCULAR | Status: AC
  Filled 2020-01-13: qty 5

## 2020-01-13 MED ORDER — ACETAMINOPHEN 325 MG PO TABS: 325.0000 mg | ORAL_TABLET | ORAL | Status: DC | PRN

## 2020-01-13 MED ORDER — ACETAMINOPHEN 650 MG RE SUPP: 650.0000 mg | RECTAL | Status: DC | PRN

## 2020-01-13 MED ORDER — PHENYLEPHRINE HCL-NACL 10-0.9 MG/250ML-% IV SOLN
INTRAVENOUS | Status: DC | PRN
  Administered 2020-01-13: 25 ug/min via INTRAVENOUS

## 2020-01-13 MED ORDER — CEFAZOLIN SODIUM-DEXTROSE 2-4 GM/100ML-% IV SOLN
2.0000 g | INTRAVENOUS | Status: AC
  Administered 2020-01-13: 2 g via INTRAVENOUS
  Filled 2020-01-13: qty 100

## 2020-01-13 SURGICAL SUPPLY — 53 items
ADH SKN CLS APL DERMABOND .7 (GAUZE/BANDAGES/DRESSINGS) ×1
APL SKNCLS STERI-STRIP NONHPOA (GAUZE/BANDAGES/DRESSINGS) ×1
BAG DECANTER FOR FLEXI CONT (MISCELLANEOUS) ×3 IMPLANT
BENZOIN TINCTURE PRP APPL 2/3 (GAUZE/BANDAGES/DRESSINGS) ×3 IMPLANT
BLADE CLIPPER SURG (BLADE) ×2 IMPLANT
BONE MATRIX OSTEOCEL PRO LRG (Bone Implant) ×2 IMPLANT
CLOSURE WOUND 1/2 X4 (GAUZE/BANDAGES/DRESSINGS) ×1
COVER BACK TABLE 60X90IN (DRAPES) ×3 IMPLANT
DERMABOND ADVANCED (GAUZE/BANDAGES/DRESSINGS) ×2
DERMABOND ADVANCED .7 DNX12 (GAUZE/BANDAGES/DRESSINGS) ×2 IMPLANT
DRAPE C-ARM 42X72 X-RAY (DRAPES) ×3 IMPLANT
DRAPE C-ARMOR (DRAPES) ×3 IMPLANT
DRAPE LAPAROTOMY 100X72X124 (DRAPES) ×3 IMPLANT
DRSG OPSITE POSTOP 3X4 (GAUZE/BANDAGES/DRESSINGS) ×2 IMPLANT
DRSG OPSITE POSTOP 4X6 (GAUZE/BANDAGES/DRESSINGS) ×4 IMPLANT
DURAPREP 26ML APPLICATOR (WOUND CARE) ×3 IMPLANT
ELECT REM PT RETURN 9FT ADLT (ELECTROSURGICAL) ×3
ELECTRODE REM PT RTRN 9FT ADLT (ELECTROSURGICAL) ×1 IMPLANT
GAUZE 4X4 16PLY RFD (DISPOSABLE) IMPLANT
GLOVE BIO SURGEON STRL SZ 6.5 (GLOVE) ×3 IMPLANT
GLOVE BIO SURGEONS STRL SZ 6.5 (GLOVE) ×2
GLOVE BIOGEL PI IND STRL 6.5 (GLOVE) ×1 IMPLANT
GLOVE BIOGEL PI INDICATOR 6.5 (GLOVE) ×4
GLOVE ECLIPSE 9.0 STRL (GLOVE) ×5 IMPLANT
GLOVE EXAM NITRILE XL STR (GLOVE) IMPLANT
GOWN STRL REUS W/ TWL LRG LVL3 (GOWN DISPOSABLE) IMPLANT
GOWN STRL REUS W/ TWL XL LVL3 (GOWN DISPOSABLE) ×2 IMPLANT
GOWN STRL REUS W/TWL 2XL LVL3 (GOWN DISPOSABLE) IMPLANT
GOWN STRL REUS W/TWL LRG LVL3 (GOWN DISPOSABLE) ×9
GOWN STRL REUS W/TWL XL LVL3 (GOWN DISPOSABLE) ×6
GUIDEWIRE NITINOL BEVEL TIP (WIRE) ×4 IMPLANT
KIT BASIN OR (CUSTOM PROCEDURE TRAY) ×3 IMPLANT
KIT DILATOR XLIF 5 (KITS) IMPLANT
KIT SURGICAL ACCESS MAXCESS 4 (KITS) ×2 IMPLANT
KIT TURNOVER KIT B (KITS) ×3 IMPLANT
KIT XLIF (KITS) ×2
MODULE NVM5 NEXT GEN EMG (NEEDLE) ×2 IMPLANT
MODULUS XLW 10X22X55MM 10 (Spine Construct) ×2 IMPLANT
NDL I-PASS III (NEEDLE) IMPLANT
NEEDLE HYPO 22GX1.5 SAFETY (NEEDLE) ×3 IMPLANT
NEEDLE I-PASS III (NEEDLE) ×3 IMPLANT
NS IRRIG 1000ML POUR BTL (IV SOLUTION) ×3 IMPLANT
PACK LAMINECTOMY NEURO (CUSTOM PROCEDURE TRAY) ×3 IMPLANT
ROD RELINE MAS LORD 5.5X50 (Rod) ×2 IMPLANT
SCREW LOCK RELINE 5.5 TULIP (Screw) ×4 IMPLANT
SCREW MAS RELINE 6.5X50 POLY (Screw) ×4 IMPLANT
SPONGE LAP 4X18 RFD (DISPOSABLE) IMPLANT
STRIP CLOSURE SKIN 1/2X4 (GAUZE/BANDAGES/DRESSINGS) ×2 IMPLANT
SUT VIC AB 2-0 CT1 18 (SUTURE) ×6 IMPLANT
SUT VIC AB 3-0 SH 8-18 (SUTURE) ×6 IMPLANT
TOWEL GREEN STERILE (TOWEL DISPOSABLE) ×3 IMPLANT
TOWEL GREEN STERILE FF (TOWEL DISPOSABLE) ×3 IMPLANT
WATER STERILE IRR 1000ML POUR (IV SOLUTION) ×3 IMPLANT

## 2020-01-13 NOTE — Anesthesia Procedure Notes (Signed)
Procedure Name: Intubation Date/Time: 01/13/2020 11:19 AM Performed by: Rande Brunt, CRNA Pre-anesthesia Checklist: Patient identified, Emergency Drugs available, Suction available and Patient being monitored Patient Re-evaluated:Patient Re-evaluated prior to induction Oxygen Delivery Method: Circle System Utilized Preoxygenation: Pre-oxygenation with 100% oxygen Induction Type: IV induction Ventilation: Mask ventilation without difficulty Laryngoscope Size: Miller and 3 Grade View: Grade I Tube type: Oral Tube size: 7.5 mm Number of attempts: 1 Airway Equipment and Method: Stylet and Oral airway Placement Confirmation: ETT inserted through vocal cords under direct vision,  positive ETCO2 and breath sounds checked- equal and bilateral Secured at: 22 cm Tube secured with: Tape Dental Injury: Teeth and Oropharynx as per pre-operative assessment

## 2020-01-13 NOTE — Anesthesia Postprocedure Evaluation (Signed)
Anesthesia Post Note  Patient: Bradley [name]  Procedure(s) Performed: RIGHT LUMBAR ONE-TWO ANTERIOR LATERAL LUMBAR FUSION WITH PERCUTANEOUS SCREW PLACEMENT (Right Spine Lumbar)     Patient location during evaluation: PACU Anesthesia Type: General Level of consciousness: awake and alert Pain management: pain level controlled Vital Signs Assessment: post-procedure vital signs reviewed and stable Respiratory status: spontaneous breathing, nonlabored ventilation, respiratory function stable and patient connected to nasal cannula oxygen Cardiovascular status: blood pressure returned to baseline and stable Postop Assessment: no apparent nausea or vomiting Anesthetic complications: no   No complications documented.  Last Vitals:  Vitals:   01/13/20 1330 01/13/20 1345  BP: 137/84 (!) 145/82  Pulse: 62 63  Resp: (!) 21 16  Temp:    SpO2: 100% 100%    Last Pain:  Vitals:   01/13/20 1345  TempSrc:   PainSc: 10-Worst pain ever    LLE Motor Response: Purposeful movement (01/13/20 1345) LLE Sensation: Full sensation (01/13/20 1345) RLE Motor Response: Purposeful movement (01/13/20 1345) RLE Sensation: Full sensation (01/13/20 1345)      [name]

## 2020-01-13 NOTE — Brief Op Note (Signed)
01/13/2020  12:57 PM  PATIENT:  Bradley [name]  49 y.o. male  PRE-OPERATIVE DIAGNOSIS:  Spondylolisthesis  POST-OPERATIVE DIAGNOSIS:  Spondylolisthesis  PROCEDURE:  Procedure(s) with comments: RIGHT LUMBAR ONE-TWO ANTERIOR LATERAL LUMBAR FUSION WITH PERCUTANEOUS SCREW PLACEMENT (Right) - 3C  SURGEON:  Surgeon(s) and Role:    * Earnie Larsson, MD - Primary  PHYSICIAN ASSISTANT:   ASSISTANTSMearl Latin   ANESTHESIA:   general  EBL:  50 mL   BLOOD ADMINISTERED:none  DRAINS: none   LOCAL MEDICATIONS USED:  MARCAINE     SPECIMEN:  No Specimen  DISPOSITION OF SPECIMEN:  N/A  COUNTS:  YES  TOURNIQUET:  * No tourniquets in log *  DICTATION: .Dragon Dictation  PLAN OF CARE: Admit for overnight observation  PATIENT DISPOSITION:  PACU - hemodynamically stable.   Delay start of Pharmacological VTE agent (>24hrs) due to surgical blood loss or risk of bleeding: yes

## 2020-01-13 NOTE — H&P (Signed)
Bradley [name] is an 49 y.o. male.   Chief Complaint: Back pain HPI: 49 year old male with chronic and progressively worsening back pain with some bilateral upper extremity symptoms which is failed conservative management.  Patient status post prior L2-3 decompression and fusion.  He has progressive adjacent level degeneration with degenerative anterolisthesis and angulation at the L1-2 level.  Patient presents now for L1 to interbody fusion with posterior instrumentation in hopes of improving his symptoms.  Past Medical History:  Diagnosis Date  . Allergy   . Anemia    since gastric bypass  . Anxiety   . Aortic valve sclerosis   . Arthritis   . Arthropathy, unspecified, other specified sites   . CAD (coronary artery disease)    01/09/20: patient denied; faint coronary calcifications on 2016 CT  . Contact with or exposure to venereal diseases   . Depression   . Displacement of intervertebral disc, site unspecified, without myelopathy   . GERD (gastroesophageal reflux disease)   . Heart murmur    mild MR, mild AS 06/2019 (Dr. [name])  . History of kidney stones   . Hypertension     " no longer have hypertension since gastric bypass "  . Lumbago   . Obesity, unspecified   . Other, mixed, or unspecified nondependent drug abuse, unspecified   . Personal history of urinary calculi   . Pneumonia   . Priapism   . Stenosis, spinal, lumbar     Past Surgical History:  Procedure Laterality Date  . ABDOMINAL SURGERY    . ANKLE SURGERY    . ANTERIOR LAT LUMBAR FUSION Left 02/24/2016   Procedure: LEFT LUMBAR TWO-THREE  ANTERIOR LATERAL LUMBAR FUSION  ;  Surgeon: Earnie Larsson, MD;  Location: [place];  Service: Neurosurgery;  Laterality: Left;  . BACK SURGERY    . GASTRIC BYPASS    . GASTRIC BYPASS  2008  . HERNIA REPAIR    . INCISION AND DRAINAGE ABSCESS Right 07/06/2004   great toe  . IRRIGATION AND DEBRIDEMENT ABSCESS Right 07/08/2004   great toe  . KNEE ARTHROPLASTY Right 04/26/2017    Procedure: RIGHT TOTAL KNEE ARTHROPLASTY WITH COMPUTER NAVIGATION;  Surgeon: Rod Can, MD;  Location: [facility];  Service: Orthopedics;  Laterality: Right;  Needs RNFA  . KNEE SURGERY    . LUMBAR PERCUTANEOUS PEDICLE SCREW 1 LEVEL Left 02/24/2016   Procedure: LUMBAR PERCUTANEOUS PEDICLE SCREW 1 LEVEL;  Surgeon: Earnie Larsson, MD;  Location: [place];  Service: Neurosurgery;  Laterality: Left;  Marland Kitchen MICRODISCECTOMY LUMBAR Left 08/24/2008   T12 - L1  . panectomy    . TOTAL KNEE ARTHROPLASTY Right 04/2017    Family History  Problem Relation Age of Onset  . Arthritis Mother   . Colon cancer Mother   . Breast cancer Mother        twice  . Kidney failure Mother   . Heart disease Father   . Hypertension Father   . Esophageal cancer Neg Hx   . Inflammatory bowel disease Neg Hx   . Liver disease Neg Hx   . Pancreatic cancer Neg Hx   . Stomach cancer Neg Hx   . Rectal cancer Neg Hx    Social History:  reports that he has never smoked. He has never used smokeless tobacco. He reports that he does not drink alcohol and does not use drugs.  Allergies:  Allergies  Allergen Reactions  . Codeine Hives and Itching    Medications Prior to Admission  Medication Sig Dispense Refill  . Cholecalciferol (VITAMIN D3) 50 MCG (2000 UT) TABS TAKE 2 TABLETS BY MOUTH DAILY (Patient taking differently: Take 4,000 Units by mouth daily. ) 180 tablet 1  . cyanocobalamin (,VITAMIN B-12,) 1000 MCG/ML injection Inject 1 mL (1,000 mcg total) into the muscle every 30 (thirty) days. 3 mL 3  . indapamide (LOZOL) 1.25 MG tablet TAKE 1 TABLET(1.25 MG) BY MOUTH DAILY (Patient taking differently: Take 1.25 mg by mouth daily. ) 90 tablet 0  . irbesartan (AVAPRO) 150 MG tablet TAKE 1 TABLET(150 MG) BY MOUTH DAILY (Patient taking differently: Take 150 mg by mouth daily. ) 90 tablet 0  . Multiple Vitamin (MULTIVITAMIN) tablet Take 1 tablet by mouth daily.    . Oxycodone HCl 10 MG TABS Take 10 mg by mouth 5 (five) times daily.   0   . zinc gluconate 50 MG tablet TAKE 1 TABLET(50 MG) BY MOUTH DAILY (Patient taking differently: Take 50 mg by mouth daily. ) 90 tablet 1  . amoxicillin (AMOXIL) 875 MG tablet Take 875 mg by mouth 2 (two) times daily. (Patient not taking: Reported on 01/06/2020)    . eszopiclone (LUNESTA) 2 MG TABS tablet TAKE 1 TABLET(2 MG) BY MOUTH AT BEDTIME AS NEEDED FOR SLEEP (Patient not taking: Reported on 01/06/2020) 90 tablet 0  . famotidine (PEPCID) 40 MG tablet Take 1 tablet (40 mg total) by mouth daily. (Patient not taking: Reported on 07/28/2019) 30 tablet 2  . ferrous sulfate 325 (65 FE) MG tablet Take 325 mg by mouth daily with breakfast. (Patient not taking: Reported on 01/06/2020)    . INS SYRINGE/NEEDLE 1CC/28G 28G X 1/2" 1 ML MISC Use to inject b12 monthly 10 each 1  . nitroGLYCERIN (NITRODUR - DOSED IN MG/24 HR) 0.2 mg/hr patch Cut and apply 1/4 patch to most painful area q24h. (Patient not taking: Reported on 07/28/2019) 30 patch 11  . omeprazole (PRILOSEC) 40 MG capsule Take 1 capsule (40 mg total) by mouth daily. (Patient not taking: Reported on 07/28/2019) 90 capsule 3  . sildenafil (VIAGRA) 100 MG tablet Take 100 mg by mouth daily as needed. (Patient not taking: Reported on 01/06/2020)    . thiamine (VITAMIN B-1) 100 MG tablet Take 1 tablet (100 mg total) by mouth daily. (Patient not taking: Reported on 07/28/2019) 90 tablet 1  . triamcinolone (NASACORT) 55 MCG/ACT AERO nasal inhaler Place 4 sprays into the nose daily. (Patient not taking: Reported on 07/28/2019) 32.4 mL 3    Results for orders placed or performed during the hospital encounter of 01/13/20 (from the past 48 hour(s))  Prepare RBC (crossmatch)     Status: None   Collection Time: 01/13/20  8:00 AM  Result Value Ref Range   Order Confirmation      ORDER PROCESSED BY BLOOD BANK Performed at [facility], [address], [place], [place] [address]   I-STAT, [place] [address]     Status: Abnormal   Collection Time: 01/13/20  8:54 AM  Result  Value Ref Range   Sodium 129 (L) 135 - 145 mmol/L   Potassium 4.1 3.5 - 5.1 mmol/L   Chloride 94 (L) 98 - 111 mmol/L   BUN 10 6 - 20 mg/dL   Creatinine, Ser 1.10 0.61 - 1.24 mg/dL   Glucose, Bld 97 70 - 99 mg/dL    Comment: Glucose reference range applies only to samples taken after fasting for at least 8 hours.   Calcium, Ion 1.14 (L) 1.15 - 1.40 mmol/L  TCO2 26 22 - 32 mmol/L   Hemoglobin 10.9 (L) 13.0 - 17.0 g/dL   HCT 32.0 (L) 39 - 52 %   No results found.  A comprehensive review of systems was negative.  Blood pressure 124/80, pulse 63, temperature 98.4 F (36.9 C), temperature source Oral, resp. rate 18, height 5' 10"  (1.778 m), weight 92.9 kg, SpO2 100 %.  Patient is awake and alert.  He is oriented and appropriate.  Speech is fluent.  Judgment insight are intact.  Cranial nerve function normal bilateral.  Motor examination intact  Sensory examination some patchy sensory loss proximally in both thighs.  Deep cervix is hypoactive.  No evidence of long track signs.  Gait antalgic.  Posture flexed peer examination head ears eyes nose throat is unremarkable chest and abdomen are obese but otherwise benign.  Extremities are free of major deformity.  Patient with generalized poor muscle tone. Assessment/Plan L1-2 degenerative spondylolisthesis with angulation status post prior L2-3 decompression and fusion.  Plan right-sided L1-L2 anterior lateral retroperitoneal interbody decompression and fusion utilizing interbody cage, morselized autograft and coupled with posterior percutaneous pedicle screw fixation at L1-2.  Risks and benefits have been explained.  Patient wishes to proceed.  Mallie Mussel A Georgean Spainhower 01/13/2020, 10:42 AM

## 2020-01-13 NOTE — Evaluation (Signed)
Physical Therapy Evaluation Patient Details Name: Bradley [name] MRN: [id number] DOB: [birth year] Today's Date: 01/13/2020   History of Present Illness  Pt is a 49 y.o. M with significant PMH of CAD, depression, prior back surgeries, right TKA who presents with L1-2 degenerative spondylolisthesis now s/p L1-2 ALIF.  Clinical Impression  Patient is s/p above surgery resulting in the deficits listed below (see PT Problem List). Prior to admission, pt lives with his spouse and is independent with intermittent use of a cane. Pt presents with decreased functional mobility secondary to 10/10 back pain, BLE weakness (particularly LLE), and decreased activity tolerance. Pt ambulating 20 ft to bathroom and back with min guard assist; demonstrates very slow gait speed. Further distance deferred due to high pain levels despite max pain medication administered prior. Patient will benefit from skilled PT to increase their independence and safety with mobility (while adhering to their precautions) to allow discharge to the venue listed below.    Follow Up Recommendations Home health PT;Supervision for mobility/OOB    Equipment Recommendations  Rolling walker with 5" wheels    Recommendations for Other Services       Precautions / Restrictions Precautions Precautions: Back;Fall Precaution Booklet Issued: Yes (comment) Precaution Comments: verbally reviewed, provided written handout Required Braces or Orthoses: Spinal Brace Spinal Brace: Applied in sitting position;Lumbar corset Restrictions Weight Bearing Restrictions: No      Mobility  Bed Mobility Overal bed mobility: Needs Assistance Bed Mobility: Rolling;Sidelying to Sit;Sit to Sidelying Rolling: Supervision Sidelying to sit: Supervision     Sit to sidelying: Mod assist General bed mobility comments: Cues for log roll technique, use of rail, no physical assist required to progress to sitting position. ModA for BLE assist back into bed     Transfers Overall transfer level: Needs assistance Equipment used: Rolling walker (2 wheeled) Transfers: Sit to/from Stand Sit to Stand: Min assist         General transfer comment: MinA to rise from edge of bed  Ambulation/Gait Ambulation/Gait assistance: Min guard Gait Distance (Feet): 20 Feet Assistive device: Rolling walker (2 wheeled) Gait Pattern/deviations: Step-through pattern;Decreased stride length;Decreased dorsiflexion - right;Decreased dorsiflexion - left;Narrow base of support Gait velocity: decreased Gait velocity interpretation: <1.31 ft/sec, indicative of household ambulator General Gait Details: Very slow pace, cues for walker proximity/use, min guard for safety   Stairs            Wheelchair Mobility    Modified Rankin (Stroke Patients Only)       Balance Overall balance assessment: Needs assistance Sitting-balance support: Feet supported Sitting balance-Leahy Scale: Good     Standing balance support: Bilateral upper extremity supported Standing balance-Leahy Scale: Poor                               Pertinent Vitals/Pain Pain Assessment: Faces Faces Pain Scale: Hurts worst Pain Location: surgical site Pain Descriptors / Indicators: Grimacing;Operative site guarding;Other (Comment) (shaking) Pain Intervention(s): Limited activity within patient's tolerance;Monitored during session;Premedicated before session;Repositioned;Ice applied    Home Living Family/patient expects to be discharged to:: Private residence Living Arrangements: Spouse/significant other Available Help at Discharge: Family Type of Home: House Home Access: Stairs to enter Entrance Stairs-Rails: Psychiatric nurse of Steps: 4 Home Layout: Able to live on main level with bedroom/bathroom Home Equipment: [place] - single point;Shower seat - built in      Prior Function Level of Independence: Independent with assistive device(s)  Comments: Intermittently uses cane      Hand Dominance        Extremity/Trunk Assessment   Upper Extremity Assessment Upper Extremity Assessment: Defer to OT evaluation    Lower Extremity Assessment Lower Extremity Assessment: RLE deficits/detail;LLE deficits/detail RLE Deficits / Details: At least 3/5 strength LLE Deficits / Details: Knee extension 2/5, ankle dorsiflexion 5/5    Cervical / Trunk Assessment Cervical / Trunk Assessment: Other exceptions Cervical / Trunk Exceptions: s/p ALIF  Communication   Communication: No difficulties  Cognition Arousal/Alertness: Awake/alert Behavior During Therapy: WFL for tasks assessed/performed Overall Cognitive Status: Within Functional Limits for tasks assessed                                        General Comments      Exercises     Assessment/Plan    PT Assessment Patient needs continued PT services  PT Problem List Decreased strength;Decreased activity tolerance;Decreased balance;Decreased mobility;Pain       PT Treatment Interventions Gait training;DME instruction;Stair training;Functional mobility training;Therapeutic activities;Therapeutic exercise;Balance training;Patient/family education    PT Goals (Current goals can be found in the Care Plan section)  Acute Rehab PT Goals Patient Stated Goal: less pain PT Goal Formulation: With patient Time For Goal Achievement: 01/27/20 Potential to Achieve Goals: Good    Frequency Min 5X/week   Barriers to discharge        Co-evaluation               AM-PAC PT "6 Clicks" Mobility  Outcome Measure Help needed turning from your back to your side while in a flat bed without using bedrails?: None Help needed moving from lying on your back to sitting on the side of a flat bed without using bedrails?: None Help needed moving to and from a bed to a chair (including a wheelchair)?: A Little Help needed standing up from a chair using your arms  (e.g., wheelchair or bedside chair)?: A Little Help needed to walk in hospital room?: A Little Help needed climbing 3-5 steps with a railing? : A Lot 6 Click Score: 19    End of Session Equipment Utilized During Treatment: Gait belt;Back brace Activity Tolerance: Patient limited by pain Patient left: in bed;with call bell/phone within reach;with family/visitor present Nurse Communication: Mobility status PT Visit Diagnosis: Difficulty in walking, not elsewhere classified (R26.2);Pain Pain - part of body:  (back)    Time: 2263-3354 PT Time Calculation (min) (ACUTE ONLY): 30 min   Charges:   PT Evaluation $PT Eval Moderate Complexity: 1 Mod PT Treatments $Gait Training: 8-22 mins        Wyona Almas, PT, DPT Acute Rehabilitation Services Pager [phone] Office [phone]   Deno Etienne 01/13/2020, 5:20 PM

## 2020-01-13 NOTE — Op Note (Signed)
Date of procedure: 01/13/2020  Date of dictation: Same  Service: Neurosurgery  Preoperative diagnosis: L1-L2 degenerative spondylolisthesis  Postoperative diagnosis: Same  Procedure Name: Right L1-L2 anterior lateral retroperitoneal interbody decompression and fusion utilizing interbody cage and morselized allograft  Right L1-L2 posterior percutaneous pedicle screw fixation  Surgeon:Madyn Ivins A.Amil Bouwman, M.D.  Asst. Surgeon: Reinaldo Meeker, NP  Anesthesia: General  Indication: 49 year old male status post prior L23 decompression fusion presents with worsening back pain and progressive disc degeneration with degenerative spondylolisthesis and angulation at L1 to.  Patient presents now for L1-2 decompression fusion after failing conservative management.  Operative note: After induction of general anesthesia patient is positioned in the left lateral decubitus position appropriately padded.  Patient is positioned with the bed flexed and his right flank and chest wall were prepped and draped sterilely.  Localizing fluoroscopy was used for surgical planning.  Incision was made overlying the L1 to disc space on the right.  A secondary incision was made in the right flank.  Working through the right flank incision blunt dissection was then made into the retroperitoneal space.  Peritoneal contents and the diaphragm were swept away from the ventral surfaces of the posterior ribs T10-T11 and T12.  Entry site was between the 10th and 11th ribs.  A dilator was passed overlying the 11th rib and docked into the lateral disc space at L1-2.  Fluoroscopy confirmed good position.  Continuous neural monitoring was used and stimulation was also performed to ensure that there were no adjacent neural structures.  The dilator was sequentially enlarged.  A self-retaining retractor was placed.  Fluoroscopy confirmed good position.  Once again stimulation was used to confirm safe passage.  A shim was then docked into the lateral disc  space at L1 to.  The distractor was opened further.  Disc base was directly stimulated and no overlying nerves were found.  The disc spaces then incised.  Discectomy was then performed using various instruments and a contralateral ligamentous release was performed.  To space sized and a 10 mm lordotic implant was found to be most appropriate.  The disc space was curettaged 1 final time.  A 10 mm NuVasive titanium printed cage which was lordotic and 55 mm wide and 22 mm in the AP dimension.  The inserter and retractor was removed.  There was no evidence of air leak or other complications such as hemorrhage.  Attention was then placed in patient's lumbar region.  The bed was returned to a neutral position.  Using AP and lateral fluoroscopy entry sites for the pedicles of L1 and L2 were made on the right side.  Stab incisions were made.  A Jamshidi needle introducer was then passed into the pedicles of L1 and L2 and into the vertebral bodies of L1 and L2.  Neural stimulation was performed along this process and confirmed safe passage.  A guidewire was placed.  The Jamshidi introducers were removed.  Each pedicle was tapped and then 6.5 mm x 50 mm NuVasive screws were placed on the right side at L1 and L2.  A pre-bent titanium rod was then placed through the screw heads at L1 and L2.  Locking caps were then placed through the screw heads.  Locking caps were then engaged given a final tightening.  Applier was removed.  Towers were removed.  Final images reveal good position of the cage and the hardware at the proper upper level with improved alignment of spine.  Wounds were irrigated and closed in typical fashion.  Steri-Strips and  sterile dressing were applied.  No apparent complications.  Patient tolerated the procedure well and he returned to the recovery room postop,

## 2020-01-13 NOTE — Anesthesia Postprocedure Evaluation (Signed)
Anesthesia Post Note  Patient: Bradley [name]  Procedure(s) Performed: RIGHT LUMBAR ONE-TWO ANTERIOR LATERAL LUMBAR FUSION WITH PERCUTANEOUS SCREW PLACEMENT (Right Spine Lumbar)     Patient location during evaluation: PACU Anesthesia Type: General Level of consciousness: awake and alert Pain management: pain level controlled Vital Signs Assessment: post-procedure vital signs reviewed and stable Respiratory status: spontaneous breathing, nonlabored ventilation, respiratory function stable and patient connected to nasal cannula oxygen Cardiovascular status: blood pressure returned to baseline and stable Postop Assessment: no apparent nausea or vomiting Anesthetic complications: no   No complications documented.  Last Vitals:  Vitals:   01/13/20 1345 01/13/20 1400  BP: (!) 145/82 (!) 148/79  Pulse: 63 65  Resp: 16 14  Temp:    SpO2: 100% 99%    Last Pain:  Vitals:   01/13/20 1345  TempSrc:   PainSc: 10-Worst pain ever                 Jovonta Levit

## 2020-01-13 NOTE — Transfer of Care (Signed)
Immediate Anesthesia Transfer of Care Note  Patient: Bradley [name]  Procedure(s) Performed: RIGHT LUMBAR ONE-TWO ANTERIOR LATERAL LUMBAR FUSION WITH PERCUTANEOUS SCREW PLACEMENT (Right Spine Lumbar)  Patient Location: PACU  Anesthesia Type:General  Level of Consciousness: drowsy, patient cooperative and responds to stimulation  Airway & Oxygen Therapy: Patient Spontanous Breathing and Patient connected to face mask oxygen  Post-op Assessment: Report given to RN, Post -op Vital signs reviewed and stable and Patient moving all extremities X 4  Post vital signs: Reviewed and stable  Last Vitals:  Vitals Value Taken Time  BP 164/91 01/13/20 1314  Temp    Pulse 77 01/13/20 1316  Resp 16 01/13/20 1316  SpO2 100 % 01/13/20 1316  Vitals shown include unvalidated device data.  Last Pain:  Vitals:   01/13/20 0859  TempSrc:   PainSc: 7       Patients Stated Pain Goal: 4 (69/40/98 2867)  Complications: No complications documented.

## 2020-01-13 NOTE — Progress Notes (Signed)
PHARMACIST - PHYSICIAN ORDER COMMUNICATION  CONCERNING: P&T Medication Policy on Herbal Medications  DESCRIPTION:  This patient's order for:  Zinc gluconate 50 mg (7 mg of elemental zinc)  has been noted.  This product(s) is classified as an "herbal" or natural product. Due to a lack of definitive safety studies or FDA approval, nonstandard manufacturing practices, plus the potential risk of unknown drug-drug interactions while on inpatient medications, the Pharmacy and Therapeutics Committee does not permit the use of "herbal" or natural products of this type within [facility].   ACTION TAKEN: The pharmacy department is unable to verify this order at this time and your patient has been informed of this safety policy. Please reevaluate patient's clinical condition at discharge and address if the herbal or natural product(s) should be resumed at that time.  Gillermina Hu, PharmD, BCPS, [facility] Clinical Pharmacist

## 2020-01-14 ENCOUNTER — Encounter (HOSPITAL_COMMUNITY): Payer: Self-pay | Admitting: Neurosurgery

## 2020-01-14 DIAGNOSIS — M4316 Spondylolisthesis, lumbar region: Secondary | ICD-10-CM | POA: Diagnosis not present

## 2020-01-14 DIAGNOSIS — I10 Essential (primary) hypertension: Secondary | ICD-10-CM | POA: Diagnosis not present

## 2020-01-14 DIAGNOSIS — Z79899 Other long term (current) drug therapy: Secondary | ICD-10-CM | POA: Diagnosis not present

## 2020-01-14 DIAGNOSIS — Z96651 Presence of right artificial knee joint: Secondary | ICD-10-CM | POA: Diagnosis not present

## 2020-01-14 DIAGNOSIS — I2510 Atherosclerotic heart disease of native coronary artery without angina pectoris: Secondary | ICD-10-CM | POA: Diagnosis not present

## 2020-01-14 MED ORDER — HYDROCODONE-ACETAMINOPHEN 10-325 MG PO TABS
1.0000 | ORAL_TABLET | ORAL | 0 refills | Status: DC | PRN
Start: 2020-01-14 — End: 2020-07-05

## 2020-01-14 MED ORDER — DIAZEPAM 5 MG PO TABS
5.0000 mg | ORAL_TABLET | Freq: Four times a day (QID) | ORAL | 0 refills | Status: DC | PRN
Start: 2020-01-14 — End: 2020-07-05

## 2020-01-14 NOTE — Discharge Summary (Signed)
Physician Discharge Summary  Patient ID: Bradley [name] MRN: [id number] DOB/AGE: Jun 02, 1970 49 y.o.  Admit date: 01/13/2020 Discharge date: 01/14/2020  Admission Diagnoses:  Discharge Diagnoses:  Active Problems:   Degenerative spondylolisthesis   Discharged Condition: good  Hospital Course: Patient was admitted to the hospital where he underwent uncomplicated right-sided Q0-H4 anterior lateral retroperitoneal interbody fusion with posterior percutaneous pedicle screw fixation. Postoperatively doing reasonably well. Back pain is improved. No new neurologic symptoms. Standing ambulating and voiding without difficulty. Tolerating regular diet. Ready for discharge home.  Consults:   Significant Diagnostic Studies:   Treatments:   Discharge Exam: Blood pressure 131/76, pulse 70, temperature 98.8 F (37.1 C), temperature source Oral, resp. rate 16, height 5' 10"  (1.778 m), weight 92.9 kg, SpO2 98 %. Awake and alert. Oriented and appropriate. Motor and sensory function intact. Wound clean and dry. Chest and abdomen benign.  Disposition: Discharge disposition: 01-Home or Self Care        Allergies as of 01/14/2020      Reactions   Codeine Hives, Itching      Medication List    TAKE these medications   cyanocobalamin 1000 MCG/ML injection Commonly known as: (VITAMIN B-12) Inject 1 mL (1,000 mcg total) into the muscle every 30 (thirty) days.   diazepam 5 MG tablet Commonly known as: VALIUM Take 1-2 tablets (5-10 mg total) by mouth every 6 (six) hours as needed for muscle spasms.   HYDROcodone-acetaminophen 10-325 MG tablet Commonly known as: NORCO Take 1-2 tablets by mouth every 4 (four) hours as needed for moderate pain ((score 4 to 6)).   indapamide 1.25 MG tablet Commonly known as: LOZOL TAKE 1 TABLET(1.25 MG) BY MOUTH DAILY What changed: See the new instructions.   INS SYRINGE/NEEDLE 1CC/28G 28G X 1/2" 1 ML Misc Use to inject b12 monthly   irbesartan 150 MG  tablet Commonly known as: AVAPRO TAKE 1 TABLET(150 MG) BY MOUTH DAILY What changed: See the new instructions.   multivitamin tablet Take 1 tablet by mouth daily.   Oxycodone HCl 10 MG Tabs Take 10 mg by mouth 5 (five) times daily.   Vitamin D3 50 MCG (2000 UT) Tabs TAKE 2 TABLETS BY MOUTH DAILY What changed: how much to take   zinc gluconate 50 MG tablet TAKE 1 TABLET(50 MG) BY MOUTH DAILY What changed: See the new instructions.            Durable Medical Equipment  (From admission, onward)         Start     Ordered   01/13/20 1503  DME Walker rolling  Once       Question:  Patient needs a walker to treat with the following condition  Answer:  Degenerative spondylolisthesis   01/13/20 1502   01/13/20 1503  DME 3 n 1  Once        01/13/20 1502           Signed: Mallie Mussel A Seraphim Trow 01/14/2020, 12:21 PM

## 2020-01-14 NOTE — Discharge Instructions (Signed)

## 2020-01-14 NOTE — Progress Notes (Signed)
Pt and wife given D/C instructions with verbal understanding. Rx's were sent to the pharmacy by MD. Pt's incisions are clean and dry with sign of infection. Pt's IV was removed prior to D/C. Pt received 3-n-1 from [employer] per MD order. Pt D/C'd home via wheelchair per MD order. Pt is stable @ D/C and has no other needs at this time. Holli Humbles, RN

## 2020-01-14 NOTE — Evaluation (Signed)
Occupational Therapy Evaluation Patient Details Name: Bradley [name] MRN: [id number] DOB: 1970/07/20 Today's Date: 01/14/2020    History of Present Illness Pt is a 49 y.o. M with significant PMH of CAD, depression, prior back surgeries, right TKA who presents with L1-2 degenerative spondylolisthesis now s/p L1-2 ALIF.   Clinical Impression   PTA patient reports independent with ADLs, driving but not working; using cane intermittently as needed for mobility. Admitted for above and limited by problem list below, including pain, back precautions and decreased activity tolerance. Educated on brace mgmt/wear schedule, activity progression, back precautions, ADL compensatory techniques and DME. Patient currently requires min assist for LB ADLs (plans to have spouse assist, but reviewed AE with pt verbally), setup for UB ADLs, supervision for grooming standing at sink, and supervision for transfers and mobility using RW.  Patient verbalizes understanding with education and recommendations, will need 3:1 for toilet transfer independence as relies on BUE support during transition.  Patient will have good support from spouse 24/7 at home, based on performance today no further OT needs have been identified and OT will sign off.  Thank you for this referral!     Follow Up Recommendations  No OT follow up;Supervision/Assistance - 24 hour    Equipment Recommendations  3 in 1 bedside commode    Recommendations for Other Services       Precautions / Restrictions Precautions Precautions: Back;Fall Precaution Booklet Issued: Yes (comment) Precaution Comments: verbally reviewed, provided written handout Required Braces or Orthoses: Spinal Brace Spinal Brace: Lumbar corset Restrictions Weight Bearing Restrictions: No      Mobility Bed Mobility Overal bed mobility: Needs Assistance Bed Mobility: Rolling;Sidelying to Sit Rolling: Supervision Sidelying to sit: Supervision       General bed  mobility comments: log roll technique with supervision, HOB elevated using rail; remained EOB upon exit    Transfers Overall transfer level: Needs assistance Equipment used: Rolling walker (2 wheeled) Transfers: Sit to/from Stand Sit to Stand: Supervision         General transfer comment: supervision for safety, good posture and safety with hand placement     Balance Overall balance assessment: Needs assistance Sitting-balance support: No upper extremity supported;Feet supported Sitting balance-Leahy Scale: Good     Standing balance support: Bilateral upper extremity supported;During functional activity;No upper extremity supported Standing balance-Leahy Scale: Poor Standing balance comment: relies on BUE support dynamically but able to engage in ADLs statically without UE support given supervision                            ADL either performed or assessed with clinical judgement   ADL Overall ADL's : Needs assistance/impaired     Grooming: Wash/dry hands;Supervision/safety;Cueing for compensatory techniques;Standing   Upper Body Bathing: Supervision/ safety;Set up;Sitting   Lower Body Bathing: Minimal assistance;Sitting/lateral leans;Sit to/from stand;Cueing for back precautions;Cueing for compensatory techniques Lower Body Bathing Details (indicate cue type and reason): reviewed bathing seated for safety, use of long sponge as unable to figure 4 with R LE--pt plans to have spouse assist initally Upper Body Dressing : Set up;Sitting   Lower Body Dressing: Minimal assistance;Sit to/from stand;Cueing for compensatory techniques;Cueing for back precautions Lower Body Dressing Details (indicate cue type and reason): pt able to manage clothing to L LE using figure 4 technique but not R LE, supervision sit to stand; pt reports plan to have spouse assist initally, reviewed compesnatory techniques  Toilet Transfer: Supervision/safety;Ambulation;RW;Comfort height  toilet Toilet Transfer  Details (indicate cue type and reason): pt relies on BUE support, will need 3:1 over commode at [employer] Transfer Details (indicate cue type and reason): reviewed use of grabbars and spouse assist initally transferring into shower, pt agreeable  Functional mobility during ADLs: Supervision/safety;Rolling walker General ADL Comments: pt educated on brace mgmt, ADl compensatory techniques and recommendations      Vision   Vision Assessment?: No apparent visual deficits     Perception     Praxis      Pertinent Vitals/Pain Pain Assessment: Faces Faces Pain Scale: Hurts even more Pain Location: surgical site Pain Descriptors / Indicators: Grimacing;Operative site guarding Pain Intervention(s): Limited activity within patient's tolerance;Monitored during session;Repositioned     Hand Dominance Right   Extremity/Trunk Assessment Upper Extremity Assessment Upper Extremity Assessment: Overall WFL for tasks assessed   Lower Extremity Assessment Lower Extremity Assessment: Defer to PT evaluation   Cervical / Trunk Assessment Cervical / Trunk Assessment: Other exceptions Cervical / Trunk Exceptions: s/p ALIF   Communication Communication Communication: No difficulties   Cognition Arousal/Alertness: Awake/alert Behavior During Therapy: Flat affect Overall Cognitive Status: Within Functional Limits for tasks assessed                                 General Comments: good recall of back precautions, flat affect noted with limited engagement with therapist   General Comments  spouse present in room     Exercises     Shoulder Instructions      Home Living Family/patient expects to be discharged to:: Private residence Living Arrangements: Spouse/significant other Available Help at Discharge: Family;Available 24 hours/day (works from home) Type of Home: House Home Access: Stairs to enter [employer] of Steps:  4 Entrance Stairs-Rails: Right;Left Home Layout: Able to live on main level with bedroom/bathroom     Bathroom Shower/Tub: Occupational psychologist: Handicapped height     Home Equipment: [place] - single point;Shower seat - built in;Grab bars - tub/shower          Prior Functioning/Environment Level of Independence: Independent with assistive device(s)        Comments: Intermittently uses cane, independent ADLs (not working)         OT Problem List: Decreased activity tolerance;Impaired balance (sitting and/or standing);Decreased knowledge of use of DME or AE;Decreased knowledge of precautions      OT Treatment/Interventions:      OT Goals(Current goals can be found in the care plan section) Acute Rehab OT Goals Patient Stated Goal: less pain OT Goal Formulation: With patient  OT Frequency:     Barriers to D/C:            Co-evaluation              AM-PAC OT "6 Clicks" Daily Activity     Outcome Measure Help from another person eating meals?: None Help from another person taking care of personal grooming?: A Little Help from another person toileting, which includes using toliet, bedpan, or urinal?: A Little Help from another person bathing (including washing, rinsing, drying)?: A Little Help from another person to put on and taking off regular upper body clothing?: None Help from another person to put on and taking off regular lower body clothing?: A Little 6 Click Score: 20   End of Session Equipment Utilized During Treatment: Back brace;Rolling walker Nurse Communication: Mobility status;Precautions  Activity  Tolerance: Patient tolerated treatment well Patient left: with call bell/phone within reach;Other (comment) (seated EOB )  OT Visit Diagnosis: Other abnormalities of gait and mobility (R26.89);Pain Pain - part of body:  (incisonal)                Time: 7124-5809 OT Time Calculation (min): 13 min Charges:  OT General Charges $OT Visit:  1 Visit OT Evaluation $OT Eval Low Complexity: 1 Low  Jolaine Artist, OT Acute Rehabilitation Services Pager [phone] Office [phone]   Delight Stare 01/14/2020, 9:45 AM

## 2020-01-14 NOTE — Progress Notes (Signed)
Physical Therapy Treatment Patient Details Name: Bradley [name] MRN: [id number] DOB: Aug 24, 1970 Today's Date: 01/14/2020    History of Present Illness Pt is a 49 y.o. M with significant PMH of CAD, depression, prior back surgeries, right TKA who presents with L1-2 degenerative spondylolisthesis now s/p L1-2 ALIF.    PT Comments    Continuing work on functional mobility and activity tolerance;  Session focused on progressive amb and stair training; Excellent improvement in gait distance and activity tolerance noted, as described below; Questions solicited and answered; Discussed car transfers; OK for dc home from PT standpoint    Follow Up Recommendations  Home health PT;Supervision for mobility/OOB     Equipment Recommendations  Rolling walker with 5" wheels    Recommendations for Other Services       Precautions / Restrictions Precautions Precautions: Back;Fall Precaution Comments: verbally reviewed Required Braces or Orthoses: Spinal Brace Spinal Brace: Lumbar corset Restrictions Weight Bearing Restrictions: No    Mobility  Bed Mobility Overal bed mobility: Needs Assistance Bed Mobility: Rolling;Sidelying to Sit Rolling: Supervision Sidelying to sit: Supervision       General bed mobility comments: log roll technique with supervision, HOB elevated using rail; remained EOB upon exit  Transfers Overall transfer level: Needs assistance Equipment used: Rolling walker (2 wheeled) Transfers: Sit to/from Stand Sit to Stand: Supervision         General transfer comment: supervision for safety, good posture and safety with hand placement   Ambulation/Gait Ambulation/Gait assistance: Supervision Gait Distance (Feet): 200 Feet Assistive device: Rolling walker (2 wheeled) Gait Pattern/deviations: Step-through pattern;Decreased stride length Gait velocity: decreased   General Gait Details: Slow pace, but overall Steady   Stairs Stairs: Yes Stairs assistance:  Supervision Stair Management: One rail Right;Step to pattern;Sideways Number of Stairs: 4 General stair comments: Verbal and demo cues for technqiue; Managing slowly, but well   Wheelchair Mobility    Modified Rankin (Stroke Patients Only)       Balance     Sitting balance-Leahy Scale: Good       Standing balance-Leahy Scale: Poor Standing balance comment: relies on BUE support dynamically but able to engage in ADLs statically without UE support given supervision                             Cognition Arousal/Alertness: Awake/alert Behavior During Therapy: Flat affect Overall Cognitive Status: Within Functional Limits for tasks assessed                                 General Comments: good recall of back precautions, flat affect noted with limited engagement with therapist      Exercises      General Comments        Pertinent Vitals/Pain Pain Assessment: 0-10 Pain Score: 5  Pain Location: surgical site Pain Descriptors / Indicators: Grimacing;Operative site guarding Pain Intervention(s): Monitored during session    Home Living                      Prior Function            PT Goals (current goals can now be found in the care plan section) Acute Rehab PT Goals Patient Stated Goal: less pain; home today PT Goal Formulation: With patient Time For Goal Achievement: 01/27/20 Potential to Achieve Goals: Good Progress towards PT goals: Progressing toward goals  Frequency    Min 5X/week      PT Plan Current plan remains appropriate    Co-evaluation              AM-PAC PT "6 Clicks" Mobility   Outcome Measure  Help needed turning from your back to your side while in a flat bed without using bedrails?: None Help needed moving from lying on your back to sitting on the side of a flat bed without using bedrails?: None Help needed moving to and from a bed to a chair (including a wheelchair)?: None Help needed  standing up from a chair using your arms (e.g., wheelchair or bedside chair)?: None Help needed to walk in hospital room?: None Help needed climbing 3-5 steps with a railing? : A Little 6 Click Score: 23    End of Session Equipment Utilized During Treatment: Back brace Activity Tolerance: Patient tolerated treatment well Patient left: in bed;with call bell/phone within reach;with family/visitor present Nurse Communication: Mobility status PT Visit Diagnosis: Difficulty in walking, not elsewhere classified (R26.2);Pain Pain - part of body:  (back)     Time: 5520-8022 PT Time Calculation (min) (ACUTE ONLY): 24 min  Charges:  $Gait Training: 23-37 mins                     Roney Marion, [place]  Acute Rehabilitation Services Pager [phone] Office [place] 01/14/2020, 1:12 PM

## 2020-01-15 LAB — TYPE AND SCREEN
ABO/RH(D): A POS
Antibody Screen: NEGATIVE
Unit division: 0
Unit division: 0

## 2020-01-15 LAB — BPAM RBC
Blood Product Expiration Date: 202111282359
Blood Product Expiration Date: 202111282359
Unit Type and Rh: 6200
Unit Type and Rh: 6200

## 2020-01-16 ENCOUNTER — Other Ambulatory Visit: Payer: Self-pay | Admitting: Internal Medicine

## 2020-01-16 DIAGNOSIS — I10 Essential (primary) hypertension: Secondary | ICD-10-CM

## 2020-01-26 ENCOUNTER — Ambulatory Visit: Payer: Medicare Other | Admitting: Pharmacist

## 2020-01-26 ENCOUNTER — Other Ambulatory Visit: Payer: Self-pay

## 2020-01-26 DIAGNOSIS — Z9884 Bariatric surgery status: Secondary | ICD-10-CM

## 2020-01-26 DIAGNOSIS — I10 Essential (primary) hypertension: Secondary | ICD-10-CM

## 2020-01-26 DIAGNOSIS — E785 Hyperlipidemia, unspecified: Secondary | ICD-10-CM

## 2020-01-26 NOTE — Chronic Care Management (AMB) (Signed)
Chronic Care Management Pharmacy  Name: Bradley [name]  MRN: [id number] DOB: 07/05/1970   Chief Complaint/ HPI  Bradley [name],  49 y.o. , male presents for their Follow-Up CCM visit with the clinical pharmacist via telephone.  PCP : [name], MD  Their chronic conditions include: HTN, HLD, NASH cirrhosis, GERD, osteoarthritis, BPH, back pain, insomnia, hx gastric bypass  Wife worked at [employer] before Financial risk analyst. Daughter in law works as Customer service manager in [employer] ED. Son works in [place] at [employer]. Two daughters and many grandkids. Takes care of 2 grandkids during the day - gets 7-10,000 steps per day.   Office Visits: 07/03/19 Dr Ronnald Ramp OV: dog bite, referred to hand surgeon.  04/22/19 Dr Ronnald Ramp OV: new dyspnea on exertion. Pt also stopped BP meds on his own. Rx'd irbesartan and indapamide  Consult Visit: 01/13/20 admission for L1-L2 fusion, back pain improved. 05/29/19 Dr Harrington Challenger (cardiology): heart murmer, no med changes. 05/08/19: iron infusion    Allergies  Allergen Reactions  . Codeine Hives and Itching   Medications: Outpatient Encounter Medications as of 01/26/2020  Medication Sig  . Cholecalciferol (VITAMIN D3) 50 MCG (2000 UT) TABS TAKE 2 TABLETS BY MOUTH DAILY (Patient taking differently: Take 4,000 Units by mouth daily. )  . cyanocobalamin (,VITAMIN B-12,) 1000 MCG/ML injection Inject 1 mL (1,000 mcg total) into the muscle every 30 (thirty) days.  . diazepam (VALIUM) 5 MG tablet Take 1-2 tablets (5-10 mg total) by mouth every 6 (six) hours as needed for muscle spasms.  Marland Kitchen HYDROcodone-acetaminophen (NORCO) 10-325 MG tablet Take 1-2 tablets by mouth every 4 (four) hours as needed for moderate pain ((score 4 to 6)).  . indapamide (LOZOL) 1.25 MG tablet TAKE 1 TABLET(1.25 MG) BY MOUTH DAILY (Patient taking differently: Take 1.25 mg by mouth daily. )  . INS SYRINGE/NEEDLE 1CC/28G 28G X 1/2" 1 ML MISC Use to inject b12 monthly  . irbesartan (AVAPRO) 150 MG tablet TAKE 1 TABLET(150 MG) BY  MOUTH DAILY  . Multiple Vitamin (MULTIVITAMIN) tablet Take 1 tablet by mouth daily.  Marland Kitchen zinc gluconate 50 MG tablet TAKE 1 TABLET(50 MG) BY MOUTH DAILY (Patient taking differently: Take 50 mg by mouth daily. )  . [DISCONTINUED] irbesartan (AVAPRO) 150 MG tablet TAKE 1 TABLET(150 MG) BY MOUTH DAILY (Patient taking differently: Take 150 mg by mouth daily. )  . [DISCONTINUED] Oxycodone HCl 10 MG TABS Take 10 mg by mouth 5 (five) times daily.  (Patient not taking: Reported on 01/26/2020)   No facility-administered encounter medications on file as of 01/26/2020.   Wt Readings from Last 3 Encounters:  01/13/20 204 lb 12.8 oz (92.9 kg)  01/09/20 204 lb 12.8 oz (92.9 kg)  10/06/19 195 lb (88.5 kg)   Current Diagnosis/Assessment:   Goals Addressed            This Visit's Progress   . Pharmacy Care Plan       CARE PLAN ENTRY  Current Barriers:  . Chronic Disease Management support, education, and care coordination needs related to Hypertension and Vitamin Deficiencies   Hypertension BP Readings from Last 3 Encounters:  01/14/20 131/76  01/09/20 132/88  10/06/19 134/84 .  Pharmacist Clinical Goal(s): o Over the next 180 days, patient will work with PharmD and providers to maintain BP goal <130/80 . Current regimen:  o Indapamide 1.25 mg daily o Irbesartan 150 mg daily . Interventions: o Discussed benefits of monitoring BP at home . Patient self care activities - Over the next 180  days, patient will: o Check BP 1-2 times weekly, document, and provide at future appointments o Ensure daily salt intake < 2300 mg/day  Vitamin Deficiencies  Ref. Range 04/28/2019 11:38  Vitamin B1 (Thiamine) Latest Ref Range: 8 - 30 nmol/L 18  VITD Latest Ref Range: 30.00 - 100.00 ng/mL 24.74 (L)  Vitamin B12 Latest Ref Range: 211 - 911 pg/mL 452  Zinc Latest Ref Range: 60 - 130 mcg/dL 41 (L) .  Pharmacist Clinical Goal(s) o Over the next 180 days, patient will work with PharmD and providers to  optimize vitamin regimen . Current regimen:  o Vitamin B12 1000 mcg/mL injection o Vitamin D 2000 IU - 2 tablet daily o Zinc 50 mg daily o Multivitamin . Interventions: o Discussed optimal vitamin ranges  . Patient self care activities - Over the next 180 days, patient will: o Continue supplements as directed  Medication management . Pharmacist Clinical Goal(s): o Over the next 180 days, patient will work with PharmD and providers to maintain optimal medication adherence . Current pharmacy: [facility] . Interventions o Comprehensive medication review performed. o Continue current medication management strategy . Patient self care activities - Over the next 180 days, patient will: o Focus on medication adherence by fill date o Take medications as prescribed o Report any questions or concerns to PharmD and/or provider(s)  Please see past updates related to this goal by clicking on the "Past Updates" button in the selected goal        Hypertension   BP goal is:  <130/80  Office blood pressures are  BP Readings from Last 3 Encounters:  01/14/20 131/76  01/09/20 132/88  10/06/19 134/84   Lab Results  Component Value Date   CREATININE 1.10 01/13/2020   BUN 10 01/13/2020   GFR 72.20 04/28/2019   GFRNONAA >60 01/09/2020   GFRAA >60 04/27/2017   NA 129 (L) 01/13/2020   K 4.1 01/13/2020   CALCIUM 8.6 (L) 01/09/2020   CO2 24 01/09/2020   Patient has failed these meds in the past: n/a Patient is currently controlled on the following medications:   Indapamide 1.25 mg daily  Irbesartan 150 mg daily  Patient checks BP at home infrequently  Patient home BP readings are ranging: n/a  We discussed diet and exercise extensively; benefits of checking BP at home and dangers of hypotension.  Plan  Continue current medications and control with diet and exercise   Chronic pain   Degenerative spondylolisthesis Osteoarthritis of knees Lumbar back pain  Patient has failed  these meds in past: oxycodone Patient is currently controlled on the following medications:   Naproxen PRN  Hydrocodone 10-325 mg 1-2 tab q4h PRN  We discussed: Pt had back surgery this month, his surgeon changed oxycodone to hydrocodone and he reports pain is manageable.   Plan  Continue current medications and control with diet and exercise   Health Maintenance/Vitamin deficiencies s/p gastric bypass   Results for TAMON, PARKERSON (MRN [id number]) as of 07/28/2019 08:27  Ref. Range 04/28/2019 11:38  Vitamin B1 (Thiamine) Latest Ref Range: 8 - 30 nmol/L 18  VITD Latest Ref Range: 30.00 - 100.00 ng/mL 24.74 (L)  Vitamin B12 Latest Ref Range: 211 - 911 pg/mL 452  Zinc Latest Ref Range: 60 - 130 mcg/dL 41 (L)   Patient is currently controlled on the following medications:   Vitamin B12 1000 mcg/mL injection  Vitamin D 2000 IU - 2 tablet daily  Multivitamin  Zinc 50 mg daily  We discussed:  Vitamin deficiencies due to history of gastric bypass; optimal vitamin ranges  Plan  Continue current medications  Medication Management   Pt uses [place] for all medications Uses pill box? No - keeps in drawer Pt endorses 99% compliance  We discussed: Pt has medications on auto refill at [facility], gets 90 day supplies and denies issues.  Plan  Continue current medication management strategy    Follow up: 6 month phone visit  Charlene Brooke, PharmD, [facility] Clinical Pharmacist [place] [facility] [phone]

## 2020-01-26 NOTE — Patient Instructions (Signed)
Visit Information  Phone number for Pharmacist: [phone]  Goals Addressed            This Visit's Progress   . Pharmacy Care Plan       CARE PLAN ENTRY  Current Barriers:  . Chronic Disease Management support, education, and care coordination needs related to Hypertension and Vitamin Deficiencies   Hypertension BP Readings from Last 3 Encounters:  01/14/20 131/76  01/09/20 132/88  10/06/19 134/84 .  Pharmacist Clinical Goal(s): o Over the next 180 days, patient will work with PharmD and providers to maintain BP goal <130/80 . Current regimen:  o Indapamide 1.25 mg daily o Irbesartan 150 mg daily . Interventions: o Discussed benefits of monitoring BP at home . Patient self care activities - Over the next 180 days, patient will: o Check BP 1-2 times weekly, document, and provide at future appointments o Ensure daily salt intake < 2300 mg/day  Vitamin Deficiencies  Ref. Range 04/28/2019 11:38  Vitamin B1 (Thiamine) Latest Ref Range: 8 - 30 nmol/L 18  VITD Latest Ref Range: 30.00 - 100.00 ng/mL 24.74 (L)  Vitamin B12 Latest Ref Range: 211 - 911 pg/mL 452  Zinc Latest Ref Range: 60 - 130 mcg/dL 41 (L) .  Pharmacist Clinical Goal(s) o Over the next 180 days, patient will work with PharmD and providers to optimize vitamin regimen . Current regimen:  o Vitamin B12 1000 mcg/mL injection o Vitamin D 2000 IU - 2 tablet daily o Zinc 50 mg daily o Multivitamin . Interventions: o Discussed optimal vitamin ranges  . Patient self care activities - Over the next 180 days, patient will: o Continue supplements as directed  Medication management . Pharmacist Clinical Goal(s): o Over the next 180 days, patient will work with PharmD and providers to maintain optimal medication adherence . Current pharmacy: [facility] . Interventions o Comprehensive medication review performed. o Continue current medication management strategy . Patient self care activities - Over the next 180  days, patient will: o Focus on medication adherence by fill date o Take medications as prescribed o Report any questions or concerns to PharmD and/or provider(s)  Please see past updates related to this goal by clicking on the "Past Updates" button in the selected goal       The patient verbalized understanding of instructions, educational materials, and care plan provided today and declined offer to receive copy of patient instructions, educational materials, and care plan.  Telephone follow up appointment with pharmacy team member scheduled for: 12 months  Charlene Brooke, PharmD, [facility] Clinical Pharmacist [facility] [phone]

## 2020-02-02 ENCOUNTER — Ambulatory Visit (INDEPENDENT_AMBULATORY_CARE_PROVIDER_SITE_OTHER): Payer: Medicare Other

## 2020-02-02 ENCOUNTER — Other Ambulatory Visit: Payer: Self-pay

## 2020-02-02 ENCOUNTER — Encounter: Payer: Self-pay | Admitting: Family

## 2020-02-02 ENCOUNTER — Ambulatory Visit (INDEPENDENT_AMBULATORY_CARE_PROVIDER_SITE_OTHER): Payer: Medicare Other | Admitting: Family

## 2020-02-02 VITALS — BP 122/78 | HR 61 | Temp 97.7°F | Ht 70.0 in | Wt 206.0 lb

## 2020-02-02 DIAGNOSIS — R0781 Pleurodynia: Secondary | ICD-10-CM | POA: Diagnosis not present

## 2020-02-02 DIAGNOSIS — D649 Anemia, unspecified: Secondary | ICD-10-CM

## 2020-02-02 DIAGNOSIS — R079 Chest pain, unspecified: Secondary | ICD-10-CM | POA: Diagnosis not present

## 2020-02-02 DIAGNOSIS — D508 Other iron deficiency anemias: Secondary | ICD-10-CM

## 2020-02-02 LAB — CBC WITH DIFFERENTIAL/PLATELET
Basophils Absolute: 0.1 10*3/uL (ref 0.0–0.1)
Basophils Relative: 0.7 % (ref 0.0–3.0)
Eosinophils Absolute: 0.2 10*3/uL (ref 0.0–0.7)
Eosinophils Relative: 3.1 % (ref 0.0–5.0)
HCT: 28.9 % — ABNORMAL LOW (ref 39.0–52.0)
Hemoglobin: 9.2 g/dL — ABNORMAL LOW (ref 13.0–17.0)
Lymphocytes Relative: 28.6 % (ref 12.0–46.0)
Lymphs Abs: 2.3 10*3/uL (ref 0.7–4.0)
MCHC: 32 g/dL (ref 30.0–36.0)
MCV: 66.8 fl — ABNORMAL LOW (ref 78.0–100.0)
Monocytes Absolute: 0.7 10*3/uL (ref 0.1–1.0)
Monocytes Relative: 8.7 % (ref 3.0–12.0)
Neutro Abs: 4.7 10*3/uL (ref 1.4–7.7)
Neutrophils Relative %: 58.9 % (ref 43.0–77.0)
Platelets: 422 10*3/uL — ABNORMAL HIGH (ref 150.0–400.0)
RBC: 4.33 Mil/uL (ref 4.22–5.81)
RDW: 17.1 % — ABNORMAL HIGH (ref 11.5–15.5)
WBC: 8 10*3/uL (ref 4.0–10.5)

## 2020-02-02 LAB — FERRITIN: Ferritin: 22.1 ng/mL (ref 22.0–322.0)

## 2020-02-02 LAB — COMPREHENSIVE METABOLIC PANEL
ALT: 15 U/L (ref 0–53)
AST: 13 U/L (ref 0–37)
Albumin: 3.5 g/dL (ref 3.5–5.2)
Alkaline Phosphatase: 71 U/L (ref 39–117)
BUN: 11 mg/dL (ref 6–23)
CO2: 28 mEq/L (ref 19–32)
Calcium: 8.7 mg/dL (ref 8.4–10.5)
Chloride: 100 mEq/L (ref 96–112)
Creatinine, Ser: 0.88 mg/dL (ref 0.40–1.50)
GFR: 99.67 mL/min (ref >60.00)
Glucose, Bld: 84 mg/dL (ref 70–99)
Potassium: 4.5 mEq/L (ref 3.5–5.1)
Sodium: 135 mEq/L (ref 135–145)
Total Bilirubin: 0.3 mg/dL (ref 0.2–1.2)
Total Protein: 6.9 g/dL (ref 6.0–8.3)

## 2020-02-02 NOTE — Progress Notes (Signed)
Bradley [name] is a 49 y.o. male with the following history as recorded in Epic[employer]:  Patient Active Problem List   Diagnosis Date Noted  . Great toe pain, left 10/06/2019  . Open wound of right index finger due to dog bite 07/03/2019  . Laceration of nail bed of finger 07/03/2019  . Benign prostatic hyperplasia without lower urinary tract symptoms 04/22/2019  . Gastroesophageal reflux disease without esophagitis 04/19/2018  . Family history of colon cancer in mother 04/19/2018  . Thiamine deficiency 04/14/2018  . Other cirrhosis of liver ([place]) 03/13/2018  . NASH (nonalcoholic steatohepatitis) 03/13/2018  . Low libido 01/28/2018  . Hyperglycemia 10/25/2017  . Hyperlipidemia LDL goal <130 10/25/2017  . Insomnia secondary to chronic pain 06/19/2017  . Osteoarthritis of left knee 04/26/2017  . Dental infection 04/09/2017  . Degenerative spondylolisthesis 02/24/2016  . Routine general medical examination at a health care facility 06/08/2015  . Cough variant asthma 02/06/2015  . Zinc deficiency 04/12/2014  . B12 deficiency anemia 04/08/2014  . Vitamin D deficiency 07/26/2012  . History of gastric bypass 07/25/2012  . Anemia, iron deficiency 07/25/2012  . NARCOTIC ABUSE 03/25/2009  . EXOGENOUS OBESITY 08/22/2007  . Osteoarthritis of right knee 03/12/2007  . Essential hypertension 10/08/2006  . BACK PAIN, LUMBAR 10/08/2006    Current Outpatient Medications  Medication Sig Dispense Refill  . Cholecalciferol (VITAMIN D3) 50 MCG (2000 UT) TABS TAKE 2 TABLETS BY MOUTH DAILY (Patient taking differently: Take 4,000 Units by mouth daily. ) 180 tablet 1  . cyanocobalamin (,VITAMIN B-12,) 1000 MCG/ML injection Inject 1 mL (1,000 mcg total) into the muscle every 30 (thirty) days. 3 mL 3  . diazepam (VALIUM) 5 MG tablet Take 1-2 tablets (5-10 mg total) by mouth every 6 (six) hours as needed for muscle spasms. 30 tablet 0  . HYDROcodone-acetaminophen (NORCO) 10-325 MG tablet Take 1-2 tablets by  mouth every 4 (four) hours as needed for moderate pain ((score 4 to 6)). 50 tablet 0  . indapamide (LOZOL) 1.25 MG tablet TAKE 1 TABLET(1.25 MG) BY MOUTH DAILY (Patient taking differently: Take 1.25 mg by mouth daily. ) 90 tablet 0  . INS SYRINGE/NEEDLE 1CC/28G 28G X 1/2" 1 ML MISC Use to inject b12 monthly 10 each 1  . irbesartan (AVAPRO) 150 MG tablet TAKE 1 TABLET(150 MG) BY MOUTH DAILY 90 tablet 0  . Multiple Vitamin (MULTIVITAMIN) tablet Take 1 tablet by mouth daily.    Marland Kitchen zinc gluconate 50 MG tablet TAKE 1 TABLET(50 MG) BY MOUTH DAILY (Patient taking differently: Take 50 mg by mouth daily. ) 90 tablet 1   No current facility-administered medications for this visit.    Allergies: Codeine  Past Medical History:  Diagnosis Date  . Allergy   . Anemia    since gastric bypass  . Anxiety   . Aortic valve sclerosis   . Arthritis   . Arthropathy, unspecified, other specified sites   . CAD (coronary artery disease)    01/09/20: patient denied; faint coronary calcifications on 2016 CT  . Contact with or exposure to venereal diseases   . Depression   . Displacement of intervertebral disc, site unspecified, without myelopathy   . GERD (gastroesophageal reflux disease)   . Heart murmur    mild MR, mild AS 06/2019 (Dr. [name])  . History of kidney stones   . Hypertension     " no longer have hypertension since gastric bypass "  . Lumbago   . Obesity, unspecified   . Other,  mixed, or unspecified nondependent drug abuse, unspecified   . Personal history of urinary calculi   . Pneumonia   . Priapism   . Stenosis, spinal, lumbar     Past Surgical History:  Procedure Laterality Date  . ABDOMINAL SURGERY    . ANKLE SURGERY    . ANTERIOR LAT LUMBAR FUSION Left 02/24/2016   Procedure: LEFT LUMBAR TWO-THREE  ANTERIOR LATERAL LUMBAR FUSION  ;  Surgeon: Earnie Larsson, MD;  Location: [facility];  Service: Neurosurgery;  Laterality: Left;  . ANTERIOR LATERAL LUMBAR FUSION WITH PERCUTANEOUS SCREW 1  LEVEL Right 01/13/2020   Procedure: RIGHT LUMBAR ONE-TWO ANTERIOR LATERAL LUMBAR FUSION WITH PERCUTANEOUS SCREW PLACEMENT;  Surgeon: Earnie Larsson, MD;  Location: [facility];  Service: Neurosurgery;  Laterality: Right;  3C  . BACK SURGERY    . GASTRIC BYPASS    . GASTRIC BYPASS  2008  . HERNIA REPAIR    . INCISION AND DRAINAGE ABSCESS Right 07/06/2004   great toe  . IRRIGATION AND DEBRIDEMENT ABSCESS Right 07/08/2004   great toe  . KNEE ARTHROPLASTY Right 04/26/2017   Procedure: RIGHT TOTAL KNEE ARTHROPLASTY WITH COMPUTER NAVIGATION;  Surgeon: Rod Can, MD;  Location: [facility];  Service: Orthopedics;  Laterality: Right;  Needs RNFA  . KNEE SURGERY    . LUMBAR PERCUTANEOUS PEDICLE SCREW 1 LEVEL Left 02/24/2016   Procedure: LUMBAR PERCUTANEOUS PEDICLE SCREW 1 LEVEL;  Surgeon: Earnie Larsson, MD;  Location: [facility];  Service: Neurosurgery;  Laterality: Left;  Marland Kitchen MICRODISCECTOMY LUMBAR Left 08/24/2008   T12 - L1  . panectomy    . TOTAL KNEE ARTHROPLASTY Right 04/2017    Family History  Problem Relation Age of Onset  . Arthritis Mother   . Colon cancer Mother   . Breast cancer Mother        twice  . Kidney failure Mother   . Heart disease Father   . Hypertension Father   . Esophageal cancer Neg Hx   . Inflammatory bowel disease Neg Hx   . Liver disease Neg Hx   . Pancreatic cancer Neg Hx   . Stomach cancer Neg Hx   . Rectal cancer Neg Hx     Social History   Tobacco Use  . Smoking status: Never Smoker  . Smokeless tobacco: Never Used  Substance Use Topics  . Alcohol use: No    Subjective:  Patient had spinal surgery on 01/13/20; notes that on 11/11 he fell while getting out of the bed and landed on his right side; has seen his back specialist in follow up who was not concerned about the hardware. Requesting ribs of his right ribs;   Also requesting updated labs today- notes that his iron level was low prior to his recent surgery; may need to be referred for iron infusion;     Objective:   Vitals:   02/02/20 1505  BP: 122/78  Pulse: 61  Temp: 97.7 F (36.5 C)  TempSrc: Oral  SpO2: 96%  Weight: 206 lb (93.4 kg)  Height: _0  (1.778 m)    General: Well developed, well nourished, in no acute distress  Skin : Warm and dry.  Head: Normocephalic and atraumatic  Lungs: Respirations unlabored; clear to auscultation bilaterally without wheeze, rales, rhonchi  CVS exam: normal rate and regular rhythm.  Neurologic: Alert and oriented; speech intact; face symmetrical;uses cane while walking;  Assessment:  1. Rib pain on right side   2. Anemia, unspecified type     Plan:  1. Update  CXR and right rib x-ray today; do not hear pneumonia but patient apparently ran a fever last week for a few days- ? If related to surgery however; follow up to be determined; does have Norco from his back surgeon- no refill requested; 2. Check labs today- may need to refer for iron infusion; follow-up to be determined;  This visit occurred during the SARS-CoV-2 public health emergency.  Safety protocols were in place, including screening questions prior to the visit, additional usage of staff PPE, and extensive cleaning of exam room while observing appropriate contact time as indicated for disinfecting solutions.     No follow-ups on file.  Orders Placed This Encounter  Procedures  . DG Ribs Unilateral Right    Standing Status:   Future    Number of Occurrences:   1    Standing Expiration Date:   02/01/2021    Order Specific Question:   Reason for Exam (SYMPTOM  OR DIAGNOSIS REQUIRED)    Answer:   right rib pain s/p fall    Order Specific Question:   Preferred imaging location?    Answer:   [facility]  . DG Chest 2 View    Standing Status:   Future    Number of Occurrences:   1    Standing Expiration Date:   02/01/2021    Order Specific Question:   Reason for Exam (SYMPTOM  OR DIAGNOSIS REQUIRED)    Answer:   right rib pain    Order Specific Question:   Preferred imaging  location?    Answer:   [facility]  . CBC with Differential/Platelet    Standing Status:   Future    Number of Occurrences:   1    Standing Expiration Date:   02/01/2021  . Comp Met (CMET)    Standing Status:   Future    Number of Occurrences:   1    Standing Expiration Date:   02/01/2021  . Ferritin    Standing Status:   Future    Number of Occurrences:   1    Standing Expiration Date:   02/01/2021    Requested Prescriptions    No prescriptions requested or ordered in this encounter

## 2020-02-03 ENCOUNTER — Other Ambulatory Visit: Payer: Self-pay | Admitting: Family

## 2020-02-03 DIAGNOSIS — D509 Iron deficiency anemia, unspecified: Secondary | ICD-10-CM

## 2020-02-04 ENCOUNTER — Encounter: Payer: Self-pay | Admitting: Family

## 2020-02-04 ENCOUNTER — Telehealth: Payer: Self-pay | Admitting: Hematology & Oncology

## 2020-02-04 NOTE — Telephone Encounter (Signed)
I called and advised patient of appts and he wanted to know why he wasn't getting iron infusion sooner.  I sent message back to referring office and asked that they contact patient He has been seen in the past by Dr Alen Blew and I asked referring office to advise if we needed to flip referral back over to his office.

## 2020-02-04 NOTE — Addendum Note (Signed)
Addended by: [name] on: 02/04/2020 12:11 PM   Modules accepted: Orders

## 2020-02-05 ENCOUNTER — Other Ambulatory Visit: Payer: Self-pay | Admitting: Internal Medicine

## 2020-02-05 DIAGNOSIS — D509 Iron deficiency anemia, unspecified: Secondary | ICD-10-CM

## 2020-02-05 DIAGNOSIS — Z9884 Bariatric surgery status: Secondary | ICD-10-CM

## 2020-02-09 ENCOUNTER — Other Ambulatory Visit: Payer: Self-pay | Admitting: Internal Medicine

## 2020-02-09 DIAGNOSIS — I10 Essential (primary) hypertension: Secondary | ICD-10-CM

## 2020-02-13 ENCOUNTER — Other Ambulatory Visit: Payer: Self-pay

## 2020-02-13 ENCOUNTER — Ambulatory Visit (HOSPITAL_COMMUNITY)
Admission: RE | Admit: 2020-02-13 | Discharge: 2020-02-13 | Disposition: A | Discharge status: Home / Self Care | Payer: Medicare Other | Source: Ambulatory Visit | Attending: Internal Medicine | Admitting: Internal Medicine

## 2020-02-13 DIAGNOSIS — D509 Iron deficiency anemia, unspecified: Secondary | ICD-10-CM | POA: Diagnosis not present

## 2020-02-13 DIAGNOSIS — Z9884 Bariatric surgery status: Secondary | ICD-10-CM | POA: Diagnosis not present

## 2020-02-13 MED ORDER — SODIUM CHLORIDE 0.9 % IV SOLN
INTRAVENOUS | Status: DC | PRN
  Administered 2020-02-13: 250 mL via INTRAVENOUS

## 2020-02-13 MED ORDER — SODIUM CHLORIDE 0.9 % IV SOLN
750.0000 mg | Freq: Once | INTRAVENOUS | Status: AC
  Administered 2020-02-13: 750 mg via INTRAVENOUS
  Filled 2020-02-13: qty 15

## 2020-02-13 NOTE — Discharge Instructions (Signed)

## 2020-02-13 NOTE — Progress Notes (Signed)
PATIENT CARE CENTER NOTE  Diagnosis: History of gastric bypass (Z98.84); Iron deficiency anemia, unspecified iron deficiency anemia type (D50.9)   Provider: [name], MD   Procedure: Christeen Douglas infusion   Note: Patient received Injectafer infusion via PIV. Tolerated well with no adverse reaction. Vital signs stable. Discharge instructions given. Patient to come back next week for second Injectafer infusion. Alert, oriented and ambulatory at discharge.

## 2020-02-19 DIAGNOSIS — M4310 Spondylolisthesis, site unspecified: Secondary | ICD-10-CM | POA: Diagnosis not present

## 2020-02-20 ENCOUNTER — Other Ambulatory Visit: Payer: Self-pay

## 2020-02-20 ENCOUNTER — Ambulatory Visit (HOSPITAL_COMMUNITY)
Admission: RE | Admit: 2020-02-20 | Discharge: 2020-02-20 | Disposition: A | Discharge status: Home / Self Care | Payer: Medicare Other | Source: Ambulatory Visit | Attending: Internal Medicine | Admitting: Internal Medicine

## 2020-02-20 DIAGNOSIS — D509 Iron deficiency anemia, unspecified: Secondary | ICD-10-CM | POA: Diagnosis not present

## 2020-02-20 DIAGNOSIS — Z9884 Bariatric surgery status: Secondary | ICD-10-CM | POA: Diagnosis not present

## 2020-02-20 DIAGNOSIS — D508 Other iron deficiency anemias: Secondary | ICD-10-CM

## 2020-02-20 MED ORDER — SODIUM CHLORIDE 0.9 % IV SOLN
INTRAVENOUS | Status: DC | PRN
  Administered 2020-02-20: 10:00:00 250 mL via INTRAVENOUS

## 2020-02-20 MED ORDER — SODIUM CHLORIDE 0.9 % IV SOLN
750.0000 mg | Freq: Once | INTRAVENOUS | Status: AC
  Administered 2020-02-20: 11:00:00 750 mg via INTRAVENOUS
  Filled 2020-02-20: qty 15

## 2020-02-20 NOTE — Progress Notes (Signed)
Patient received IV Injectafer as ordered by [name] MD. Observed for at least 30 minutes post infusion.Tolerated well, vitals stable, discharge instructions given, verbalized understanding. Patient alert, oriented and ambulatory at the time of discharge.

## 2020-02-20 NOTE — Discharge Instructions (Signed)

## 2020-02-23 ENCOUNTER — Ambulatory Visit: Payer: Medicare Other | Admitting: Hematology & Oncology

## 2020-02-23 ENCOUNTER — Other Ambulatory Visit: Payer: Medicare Other

## 2020-02-24 ENCOUNTER — Other Ambulatory Visit (INDEPENDENT_AMBULATORY_CARE_PROVIDER_SITE_OTHER): Payer: Medicare Other

## 2020-02-24 ENCOUNTER — Other Ambulatory Visit: Payer: Self-pay | Admitting: Internal Medicine

## 2020-02-24 ENCOUNTER — Encounter: Payer: Self-pay | Admitting: Internal Medicine

## 2020-02-24 DIAGNOSIS — E60 Dietary zinc deficiency: Secondary | ICD-10-CM | POA: Diagnosis not present

## 2020-02-24 DIAGNOSIS — D510 Vitamin B12 deficiency anemia due to intrinsic factor deficiency: Secondary | ICD-10-CM

## 2020-02-24 DIAGNOSIS — Z9884 Bariatric surgery status: Secondary | ICD-10-CM

## 2020-02-24 DIAGNOSIS — E519 Thiamine deficiency, unspecified: Secondary | ICD-10-CM

## 2020-02-24 DIAGNOSIS — E559 Vitamin D deficiency, unspecified: Secondary | ICD-10-CM

## 2020-02-24 DIAGNOSIS — D508 Other iron deficiency anemias: Secondary | ICD-10-CM

## 2020-02-24 LAB — CBC WITH DIFFERENTIAL/PLATELET
Basophils Absolute: 0.1 10*3/uL (ref 0.0–0.1)
Basophils Relative: 0.6 % (ref 0.0–3.0)
Eosinophils Absolute: 0.2 10*3/uL (ref 0.0–0.7)
Eosinophils Relative: 2.7 % (ref 0.0–5.0)
HCT: 35.7 % — ABNORMAL LOW (ref 39.0–52.0)
Hemoglobin: 11.2 g/dL — ABNORMAL LOW (ref 13.0–17.0)
Lymphocytes Relative: 38.4 % (ref 12.0–46.0)
Lymphs Abs: 3.1 10*3/uL (ref 0.7–4.0)
MCHC: 31.3 g/dL (ref 30.0–36.0)
MCV: 71.7 fl — ABNORMAL LOW (ref 78.0–100.0)
Monocytes Absolute: 0.7 10*3/uL (ref 0.1–1.0)
Monocytes Relative: 8.6 % (ref 3.0–12.0)
Neutro Abs: 4 10*3/uL (ref 1.4–7.7)
Neutrophils Relative %: 49.7 % (ref 43.0–77.0)
Platelets: 295 10*3/uL (ref 150.0–400.0)
RBC: 4.97 Mil/uL (ref 4.22–5.81)
RDW: 22.1 % — ABNORMAL HIGH (ref 11.5–15.5)
WBC: 8.1 10*3/uL (ref 4.0–10.5)

## 2020-02-24 LAB — FERRITIN: Ferritin: 378 ng/mL — ABNORMAL HIGH (ref 22.0–322.0)

## 2020-02-24 LAB — VITAMIN B12: Vitamin B-12: 983 pg/mL — ABNORMAL HIGH (ref 211–911)

## 2020-02-24 LAB — FOLATE: Folate: 11.1 ng/mL (ref >5.9)

## 2020-02-24 LAB — VITAMIN D 25 HYDROXY (VIT D DEFICIENCY, FRACTURES): VITD: 50.18 ng/mL (ref 30.00–100.00)

## 2020-02-24 LAB — IRON: Iron: 78 ug/dL (ref 42–165)

## 2020-02-29 ENCOUNTER — Encounter: Payer: Self-pay | Admitting: Internal Medicine

## 2020-02-29 ENCOUNTER — Other Ambulatory Visit: Payer: Self-pay | Admitting: Internal Medicine

## 2020-02-29 DIAGNOSIS — E519 Thiamine deficiency, unspecified: Secondary | ICD-10-CM

## 2020-02-29 DIAGNOSIS — Z9884 Bariatric surgery status: Secondary | ICD-10-CM

## 2020-02-29 DIAGNOSIS — E60 Dietary zinc deficiency: Secondary | ICD-10-CM

## 2020-02-29 LAB — ZINC: Zinc: 42 ug/dL — ABNORMAL LOW (ref 60–130)

## 2020-02-29 LAB — VITAMIN B1: Vitamin B1 (Thiamine): 8 nmol/L (ref 8–30)

## 2020-02-29 MED ORDER — ZINC GLUCONATE 50 MG PO TABS: 50.0000 mg | ORAL_TABLET | Freq: Every day | ORAL | 1 refills | Status: DC

## 2020-02-29 MED ORDER — VITAMIN B-1 50 MG PO TABS: 50.0000 mg | ORAL_TABLET | Freq: Every day | ORAL | 1 refills | Status: DC

## 2020-03-01 ENCOUNTER — Other Ambulatory Visit: Payer: Self-pay | Admitting: Internal Medicine

## 2020-03-01 DIAGNOSIS — Z9884 Bariatric surgery status: Secondary | ICD-10-CM

## 2020-03-01 DIAGNOSIS — E60 Dietary zinc deficiency: Secondary | ICD-10-CM

## 2020-03-01 MED ORDER — ZINC GLUCONATE 50 MG PO TABS: 100.0000 mg | ORAL_TABLET | Freq: Every day | ORAL | 1 refills | Status: DC

## 2020-03-04 ENCOUNTER — Telehealth: Payer: Self-pay

## 2020-03-04 NOTE — Telephone Encounter (Signed)
-----   Message from Irving Copas., MD sent at 03/04/2020  5:09 AM EST ----- Dr. Ronnald Ramp, thanks for sending me the updated labs. Bradley [name], this patient underwent an upper and lower endoscopy with findings of esophagitis as well as an inadequate colon preparation in 2020.  He never rescheduled colonoscopy over the course the last 1.5 to 2 years.  Please reach out to the patient as able and get him set up for a repeat colonoscopy and consideration of repeat endoscopy in the setting of follow-up of his esophagitis in 2022.  Please update Dr. Ronnald Ramp and myself once this has been scheduled.  Thanks. GM ----- Message ----- From: Interface, Lab In Three Zero One Sent: 02/24/2020   5:20 PM EST To: Irving Copas., MD

## 2020-03-04 NOTE — Telephone Encounter (Signed)
I spoke with the pt and he tells me he is not ready to have any procedures at this time.  He says he recently had back surgery and would like to recover and call us back when he is ready.

## 2020-03-07 NOTE — Telephone Encounter (Signed)
Thanks for the update Patty. Put in a 45-monthrecall into the system so that hopefully he will call [place] by then. FYI Dr. JRonnald Ramp  GM

## 2020-03-08 NOTE — Telephone Encounter (Signed)
Recall entered to reach out to the pt in 6 months.

## 2020-03-22 ENCOUNTER — Telehealth: Payer: Self-pay | Admitting: Pharmacist

## 2020-03-22 NOTE — Progress Notes (Signed)
Chronic Care Management Pharmacy Assistant   Name: Bradley [name]  MRN: [id number] DOB: 1970/04/18  Reason for Encounter: Hypertension Adherence Call   PCP : [name], MD  Allergies:   Allergies  Allergen Reactions   Codeine Hives and Itching    Medications: Outpatient Encounter Medications as of 03/22/2020  Medication Sig   Cholecalciferol (VITAMIN D3) 50 MCG (2000 UT) TABS TAKE 2 TABLETS BY MOUTH DAILY (Patient taking differently: Take 4,000 Units by mouth daily. )   cyanocobalamin (,VITAMIN B-12,) 1000 MCG/ML injection Inject 1 mL (1,000 mcg total) into the muscle every 30 (thirty) days.   diazepam (VALIUM) 5 MG tablet Take 1-2 tablets (5-10 mg total) by mouth every 6 (six) hours as needed for muscle spasms.   HYDROcodone-acetaminophen (NORCO) 10-325 MG tablet Take 1-2 tablets by mouth every 4 (four) hours as needed for moderate pain ((score 4 to 6)).   indapamide (LOZOL) 1.25 MG tablet Take 1 tablet (1.25 mg total) by mouth daily.   INS SYRINGE/NEEDLE 1CC/28G 28G X 1/2" 1 ML MISC Use to inject b12 monthly   irbesartan (AVAPRO) 150 MG tablet TAKE 1 TABLET(150 MG) BY MOUTH DAILY   Multiple Vitamin (MULTIVITAMIN) tablet Take 1 tablet by mouth daily.   thiamine (VITAMIN B-1) 50 MG tablet Take 1 tablet (50 mg total) by mouth daily.   zinc gluconate 50 MG tablet Take 2 tablets (100 mg total) by mouth daily.   No facility-administered encounter medications on file as of 03/22/2020.    Current Diagnosis: Patient Active Problem List   Diagnosis Date Noted   Benign prostatic hyperplasia without lower urinary tract symptoms 04/22/2019   Gastroesophageal reflux disease without esophagitis 04/19/2018   Family history of colon cancer in mother 04/19/2018   Thiamine deficiency 04/14/2018   Other cirrhosis of liver ([place]) 03/13/2018   NASH (nonalcoholic steatohepatitis) 03/13/2018   Low libido 01/28/2018   Hyperglycemia 10/25/2017   Hyperlipidemia LDL goal  <130 10/25/2017   Insomnia secondary to chronic pain 06/19/2017   Osteoarthritis of left knee 04/26/2017   Dental infection 04/09/2017   Degenerative spondylolisthesis 02/24/2016   Routine general medical examination at a health care facility 06/08/2015   Cough variant asthma 02/06/2015   Zinc deficiency 04/12/2014   B12 deficiency anemia 04/08/2014   Vitamin D deficiency 07/26/2012   History of gastric bypass 07/25/2012   Anemia, iron deficiency 07/25/2012   NARCOTIC ABUSE 03/25/2009   EXOGENOUS OBESITY 08/22/2007   Osteoarthritis of right knee 03/12/2007   Essential hypertension 10/08/2006   BACK PAIN, LUMBAR 10/08/2006    Goals Addressed   None     Follow-Up:  Pharmacist Review   Reviewed chart prior to disease state call. Spoke with patient regarding BP  Recent Office Vitals: BP Readings from Last 3 Encounters:  02/20/20 128/86  02/13/20 132/88  02/02/20 122/78   Pulse Readings from Last 3 Encounters:  02/20/20 66  02/13/20 62  02/02/20 61    Wt Readings from Last 3 Encounters:  02/02/20 206 lb (93.4 kg)  01/13/20 204 lb 12.8 oz (92.9 kg)  01/09/20 204 lb 12.8 oz (92.9 kg)     Kidney Function Lab Results  Component Value Date/Time   CREATININE 0.88 02/02/2020 03:29 PM   CREATININE 1.10 01/13/2020 08:54 AM   CREATININE 0.69 09/29/2014 02:36 PM   GFR 99.67 02/02/2020 03:29 PM   GFRNONAA >60 01/09/2020 10:45 AM   GFRNONAA >89 09/29/2014 02:36 PM   GFRAA >60 04/27/2017 05:24 AM   GFRAA >89  09/29/2014 02:36 PM    BMP Latest Ref Rng & Units 02/02/2020 01/13/2020 01/09/2020  Glucose 70 - 99 mg/dL 84 97 78  BUN 6 - 23 mg/dL 11 10 12   Creatinine 0.40 - 1.50 mg/dL 0.88 1.10 0.99  Sodium 135 - 145 mEq/L 135 129(L) 127(L)  Potassium 3.5 - 5.1 mEq/L 4.5 4.1 4.3  Chloride 96 - 112 mEq/L 100 94(L) 93(L)  CO2 19 - 32 mEq/L 28 - 24  Calcium 8.4 - 10.5 mg/dL 8.7 - 8.6(L)     Current antihypertensive regimen: The patient regime for HTN is  Indapamide and Irbesartan   How often are you checking your Blood Pressure? The patient states that he checks his blood pressure at least once a week but mostly at the doctors office   Current home BP readings: The patient did not having any blood pressure reading from home   What recent interventions/DTPs have been made by any provider to improve Blood Pressure control since last CPP Visit: The patient states that he should check his blood pressure more often at home and he is trying to eat more healthier   Any recent hospitalizations or ED visits since last visit with CPP? The patient stated that he was in the hospital about three months ago for back surgery   What diet changes have been made to improve Blood Pressure Control? The patient states that he is not eating red meats but getting more green vegetables   What exercise is being done to improve your Blood Pressure Control? The patient states that he is not doing much exercise    Adherence Review: Is the patient currently on ACE/ARB medication? Yes, Bradley [name] Does the patient have >5 day gap between last estimated fill dates? No   Wendy Poet, [place] [phone]

## 2020-03-23 ENCOUNTER — Other Ambulatory Visit: Payer: Self-pay | Admitting: Internal Medicine

## 2020-03-23 DIAGNOSIS — G4701 Insomnia due to medical condition: Secondary | ICD-10-CM

## 2020-03-23 DIAGNOSIS — G8929 Other chronic pain: Secondary | ICD-10-CM

## 2020-03-31 ENCOUNTER — Other Ambulatory Visit: Payer: Self-pay | Admitting: Internal Medicine

## 2020-03-31 ENCOUNTER — Telehealth: Payer: Self-pay

## 2020-03-31 DIAGNOSIS — G4701 Insomnia due to medical condition: Secondary | ICD-10-CM

## 2020-03-31 DIAGNOSIS — G8929 Other chronic pain: Secondary | ICD-10-CM

## 2020-03-31 DIAGNOSIS — G894 Chronic pain syndrome: Secondary | ICD-10-CM | POA: Diagnosis not present

## 2020-03-31 MED ORDER — ESZOPICLONE 2 MG PO TABS: 2.0000 mg | ORAL_TABLET | Freq: Every evening | ORAL | 2 refills | Status: DC | PRN

## 2020-03-31 NOTE — Telephone Encounter (Signed)
Pt called to follow up on Rx refill for Lunesta. Please advise.

## 2020-04-12 ENCOUNTER — Other Ambulatory Visit: Payer: Self-pay | Admitting: Internal Medicine

## 2020-04-12 DIAGNOSIS — I10 Essential (primary) hypertension: Secondary | ICD-10-CM

## 2020-04-13 ENCOUNTER — Other Ambulatory Visit: Payer: Self-pay | Admitting: Internal Medicine

## 2020-04-13 DIAGNOSIS — I10 Essential (primary) hypertension: Secondary | ICD-10-CM

## 2020-04-14 DIAGNOSIS — Z981 Arthrodesis status: Secondary | ICD-10-CM | POA: Diagnosis not present

## 2020-04-29 ENCOUNTER — Other Ambulatory Visit: Payer: Self-pay | Admitting: Internal Medicine

## 2020-04-29 DIAGNOSIS — E60 Dietary zinc deficiency: Secondary | ICD-10-CM

## 2020-04-29 DIAGNOSIS — Z9884 Bariatric surgery status: Secondary | ICD-10-CM

## 2020-05-03 ENCOUNTER — Other Ambulatory Visit: Payer: Self-pay

## 2020-05-03 ENCOUNTER — Encounter: Payer: Self-pay | Admitting: Internal Medicine

## 2020-05-03 ENCOUNTER — Ambulatory Visit (INDEPENDENT_AMBULATORY_CARE_PROVIDER_SITE_OTHER): Payer: Medicare Other | Admitting: Internal Medicine

## 2020-05-03 VITALS — BP 116/78 | HR 73 | Temp 98.3°F | Resp 16 | Ht 70.0 in | Wt 205.0 lb

## 2020-05-03 DIAGNOSIS — I10 Essential (primary) hypertension: Secondary | ICD-10-CM

## 2020-05-03 DIAGNOSIS — L02213 Cutaneous abscess of chest wall: Secondary | ICD-10-CM | POA: Insufficient documentation

## 2020-05-03 MED ORDER — SULFAMETHOXAZOLE-TRIMETHOPRIM 800-160 MG PO TABS: 1.0000 | ORAL_TABLET | Freq: Two times a day (BID) | ORAL | 0 refills | Status: AC

## 2020-05-03 NOTE — Progress Notes (Signed)
Subjective:  Patient ID: Bradley [name], male    DOB: [birth year]  Age: 50 y.o. MRN: [id number]  CC: Hypertension  This visit occurred during the SARS-CoV-2 public health emergency.  Safety protocols were in place, including screening questions prior to the visit, additional usage of staff PPE, and extensive cleaning of exam room while observing appropriate contact time as indicated for disinfecting solutions.    HPI Bradley [name] presents for f/up-  He complains of a 3 day hx of pain, swelling, and yellow pus draining from a red area over his right upper chest/breast area.  Outpatient Medications Prior to Visit  Medication Sig Dispense Refill  . Cholecalciferol (VITAMIN D3) 50 MCG (2000 UT) TABS TAKE 2 TABLETS BY MOUTH DAILY (Patient taking differently: Take 4,000 Units by mouth daily.) 180 tablet 1  . cyanocobalamin (,VITAMIN B-12,) 1000 MCG/ML injection Inject 1 mL (1,000 mcg total) into the muscle every 30 (thirty) days. 3 mL 3  . diazepam (VALIUM) 5 MG tablet Take 1-2 tablets (5-10 mg total) by mouth every 6 (six) hours as needed for muscle spasms. 30 tablet 0  . eszopiclone (LUNESTA) 2 MG TABS tablet Take 1 tablet (2 mg total) by mouth at bedtime as needed for sleep. Take immediately before bedtime 30 tablet 2  . HYDROcodone-acetaminophen (NORCO) 10-325 MG tablet Take 1-2 tablets by mouth every 4 (four) hours as needed for moderate pain ((score 4 to 6)). 50 tablet 0  . INS SYRINGE/NEEDLE 1CC/28G 28G X 1/2" 1 ML MISC Use to inject b12 monthly 10 each 1  . irbesartan (AVAPRO) 150 MG tablet TAKE 1 TABLET(150 MG) BY MOUTH DAILY 90 tablet 0  . Multiple Vitamin (MULTIVITAMIN) tablet Take 1 tablet by mouth daily.    Marland Kitchen thiamine (VITAMIN B-1) 50 MG tablet Take 1 tablet (50 mg total) by mouth daily. 90 tablet 1  . zinc gluconate 50 MG tablet TAKE 1 TABLET(50 MG) BY MOUTH DAILY 90 tablet 0  . indapamide (LOZOL) 1.25 MG tablet TAKE 1 TABLET(1.25 MG) BY MOUTH DAILY 90 tablet 0   No  facility-administered medications prior to visit.    ROS Review of Systems  Constitutional: Positive for fatigue. Negative for appetite change, chills, diaphoresis, fever and unexpected weight change.  HENT: Negative.   Eyes: Negative.   Respiratory: Negative for cough, chest tightness, shortness of breath and wheezing.   Cardiovascular: Negative for chest pain, palpitations and leg swelling.  Gastrointestinal: Negative for abdominal pain, diarrhea, nausea and vomiting.  Endocrine: Negative.   Genitourinary: Negative.  Negative for difficulty urinating.  Musculoskeletal: Negative for myalgias.  Skin: Negative.   Neurological: Negative.  Negative for dizziness, weakness and light-headedness.  Hematological: Negative for adenopathy. Does not bruise/bleed easily.  Psychiatric/Behavioral: Negative.     Objective:  BP 116/78   Pulse 73   Temp 98.3 F (36.8 C) (Oral)   Resp 16   Ht 5' 10"  (1.778 m)   Wt 205 lb (93 kg)   SpO2 95%   BMI 29.41 kg/m   BP Readings from Last 3 Encounters:  05/03/20 116/78  02/20/20 128/86  02/13/20 132/88    Wt Readings from Last 3 Encounters:  05/03/20 205 lb (93 kg)  02/02/20 206 lb (93.4 kg)  01/13/20 204 lb 12.8 oz (92.9 kg)    Physical Exam Vitals reviewed.  HENT:     Nose: Nose normal.     Mouth/Throat:     Mouth: Mucous membranes are moist.  Eyes:     General: No  scleral icterus.    Conjunctiva/sclera: Conjunctivae normal.  Cardiovascular:     Rate and Rhythm: Normal rate and regular rhythm.     Heart sounds: No murmur heard.   Pulmonary:     Effort: Pulmonary effort is normal.     Breath sounds: No stridor. No wheezing, rhonchi or rales.  Chest:    Abdominal:     General: Abdomen is flat.     Palpations: There is no mass.     Tenderness: There is no abdominal tenderness. There is no guarding.  Musculoskeletal:        General: Normal range of motion.     Cervical back: Neck supple.     Right lower leg: No edema.      Left lower leg: No edema.  Lymphadenopathy:     Cervical: No cervical adenopathy.  Skin:    General: Skin is warm and dry.     Findings: Erythema present.  Neurological:     General: No focal deficit present.     Mental Status: He is alert.  Psychiatric:        Mood and Affect: Mood normal.        Behavior: Behavior normal.     Lab Results  Component Value Date   WBC 8.1 02/24/2020   HGB 11.2 (L) 02/24/2020   HCT 35.7 (L) 02/24/2020   PLT 295.0 02/24/2020   GLUCOSE 84 02/02/2020   CHOL 133 04/28/2019   TRIG 67.0 04/28/2019   HDL 54.80 04/28/2019   LDLCALC 65 04/28/2019   ALT 15 02/02/2020   AST 13 02/02/2020   NA 135 02/02/2020   K 4.5 02/02/2020   CL 100 02/02/2020   CREATININE 0.88 02/02/2020   BUN 11 02/02/2020   CO2 28 02/02/2020   TSH 3.43 04/28/2019   PSA 0.88 04/28/2019   HGBA1C 5.7 10/25/2017    After informed verbal consent was obtained. Using Betadine for cleansing and 2% Lidocaine with epinephrine for anesthetic (2 cc's used), with sterile technique a 5 mm punch incision was made and a small cavity was found with scant amount of exudate. The cavity was cultured and irrigated with H2O2 and Qtips. No deep tracking or loculations were found. The cavity was packed with iodoform. Hemostasis was obtained by pressure. The specimen is labeled and sent. The procedure was well tolerated without complications.  Assessment & Plan:   Bradley [name] was seen today for hypertension.  Diagnoses and all orders for this visit:  Chest wall abscess- I and D completed and the clx is pending. This may be MRSA. Will start SMX/TMP. He will remove the packing in 48 hours.  -     WOUND CULTURE; Future -     sulfamethoxazole-trimethoprim (BACTRIM DS) 800-160 MG tablet; Take 1 tablet by mouth 2 (two) times daily for 7 days. -     WOUND CULTURE  Essential hypertension- His BP is over-controlled. Will discontinue the thiazide diuretic.    I have discontinued Bradley [name]. [name] indapamide.  I am also having him start on sulfamethoxazole-trimethoprim. Additionally, I am having him maintain his multivitamin, INS SYRINGE/NEEDLE 1CC/28G, Vitamin D3, cyanocobalamin, HYDROcodone-acetaminophen, diazepam, thiamine, eszopiclone, irbesartan, and zinc gluconate.  Meds ordered this encounter  Medications  . sulfamethoxazole-trimethoprim (BACTRIM DS) 800-160 MG tablet    Sig: Take 1 tablet by mouth 2 (two) times daily for 7 days.    Dispense:  14 tablet    Refill:  0     Follow-up: Return in about 3 months (around 07/31/2020).  [name], MD

## 2020-05-03 NOTE — Patient Instructions (Signed)
Incision and Drainage Incision and drainage is a surgical procedure to open and drain a fluid-filled sac. The sac may be filled with pus, mucus, or blood. Examples of fluid-filled sacs that may need surgical drainage include cysts, skin infections (abscesses), and red lumps that develop from a ruptured cyst or a small abscess (boils). You may need this procedure if the affected area is large, painful, infected, or not healing well. Tell a health care provider about:  Any allergies you have.  All medicines you are taking, including vitamins, herbs, eye drops, creams, and over-the-counter medicines.  Any problems you or family members have had with anesthetic medicines.  Any blood disorders you have or have had.  Any surgeries you have had.  Any medical conditions you have or have had.  Whether you are pregnant or may be pregnant. What are the risks? Generally, this is a safe procedure. However, problems may occur, including:  Infection.  Bleeding.  Allergic reactions to medicines.  Scarring.  The cyst or abscess returns.  Damage to nerves or vessels. What happens before the procedure? Medicine Ask your health care provider about:  Changing or stopping your regular medicines. This is especially important if you are taking diabetes medicines or blood thinners.  Taking medicines such as aspirin and ibuprofen. These medicines can thin your blood. Do not take these medicines unless your health care provider tells you to take them.  Taking over-the-counter medicines, vitamins, herbs, and supplements. Tests You may have an exam or testing. These may include:  Ultrasound or other imaging tests to see how large or deep the fluid-filled sac is.  Blood tests to check for infection. General instructions  Follow instructions from your health care provider about eating or drinking restrictions.  Plan to have someone take you home from the hospital or clinic.  Ask your health care  provider whether a responsible adult should care for you for at least 24 hours after you leave the hospital or clinic. This is important.  You may get a tetanus shot.  Ask your health care provider: ? How your surgery site will be marked or identified. ? What steps will be taken to help prevent infection. These may include:  Removing hair at the surgery site.  Washing skin with a germ-killing soap.  Receiving antibiotic medicine. What happens during the procedure?  An IV may be inserted into one of your veins.  You will be given one or more of the following: ? A medicine to help you relax (sedative). ? A medicine to numb the area (local anesthetic). ? A medicine to make you fall asleep (general anesthetic).  An incision will be made in the top of the fluid-filled sac.  Pus, blood, and mucus will be squeezed out, and a syringe or tube (drain) may be used to empty more fluid from the sac.  Your health care provider will do one of the following. He or she may: ? Leave the drain in place for several weeks to drain more fluid. ? Stitch open the edges of the incision to make a long-term opening for drainage (marsupialization).  The inside of the sac may be washed out (irrigated) with a sterile solution and packed with gauze before it is covered with a bandage (dressing).  Your health care provider do a culture test of the drainage fluid. The procedure may vary among health care providers and hospitals.   What happens after the procedure?  Your blood pressure, heart rate, breathing rate, and blood oxygen  level will be monitored often until you leave the hospital or clinic.  Do not drive for 24 hours if you were given a sedative during your procedure. Summary  Incision and drainage is a surgical procedure to open and drain a fluid-filled sac. The sac may be filled with pus, mucus, or blood.  Before the procedure, you may be given antibiotic medicine to treat or help prevent  infection.  During the procedure, an incision will be made in the top of the fluid-filled sac. Pus, blood, and mucus is squeezed out, and a syringe or tube (drain) may be used to empty more fluid from the sac.  The inside of the sac may be washed out (irrigated) with a sterile solution and packed with gauze before it is covered with a bandage (dressing). This information is not intended to replace advice given to you by your health care provider. Make sure you discuss any questions you have with your health care provider. Document Revised: 01/21/2018 Document Reviewed: 01/21/2018 Elsevier Patient Education  2021 [employer].

## 2020-05-05 ENCOUNTER — Encounter: Payer: Self-pay | Admitting: Internal Medicine

## 2020-05-06 LAB — WOUND CULTURE

## 2020-05-11 DIAGNOSIS — M13842 Other specified arthritis, left hand: Secondary | ICD-10-CM | POA: Diagnosis not present

## 2020-05-11 DIAGNOSIS — M13841 Other specified arthritis, right hand: Secondary | ICD-10-CM | POA: Diagnosis not present

## 2020-05-26 ENCOUNTER — Other Ambulatory Visit: Payer: Self-pay | Admitting: Internal Medicine

## 2020-05-26 DIAGNOSIS — Z9884 Bariatric surgery status: Secondary | ICD-10-CM

## 2020-05-26 DIAGNOSIS — E559 Vitamin D deficiency, unspecified: Secondary | ICD-10-CM

## 2020-05-27 ENCOUNTER — Telehealth: Payer: Self-pay | Admitting: Pharmacist

## 2020-05-27 NOTE — Chronic Care Management (AMB) (Addendum)
Chronic Care Management Pharmacy Assistant   Name: Bradley [name]  MRN: [id number] DOB: 1970/12/06   Reason for Encounter: Disease State    Recent office visits:  05/03/2020 [name], MD (PCP) discontinue Indapamide   Recent consult visits:  04/14/2020 Earnie Larsson, MD (Neurosurgery)   Hospital visits:  None in previous 6 months  Medications: Outpatient Encounter Medications as of 05/27/2020  Medication Sig   Cholecalciferol (VITAMIN D3) 50 MCG (2000 UT) TABS TAKE 2 TABLETS BY MOUTH DAILY   cyanocobalamin (,VITAMIN B-12,) 1000 MCG/ML injection Inject 1 mL (1,000 mcg total) into the muscle every 30 (thirty) days.   diazepam (VALIUM) 5 MG tablet Take 1-2 tablets (5-10 mg total) by mouth every 6 (six) hours as needed for muscle spasms.   eszopiclone (LUNESTA) 2 MG TABS tablet Take 1 tablet (2 mg total) by mouth at bedtime as needed for sleep. Take immediately before bedtime   HYDROcodone-acetaminophen (NORCO) 10-325 MG tablet Take 1-2 tablets by mouth every 4 (four) hours as needed for moderate pain ((score 4 to 6)).   INS SYRINGE/NEEDLE 1CC/28G 28G X 1/2" 1 ML MISC Use to inject b12 monthly   irbesartan (AVAPRO) 150 MG tablet TAKE 1 TABLET(150 MG) BY MOUTH DAILY   Multiple Vitamin (MULTIVITAMIN) tablet Take 1 tablet by mouth daily.   thiamine (VITAMIN B-1) 50 MG tablet Take 1 tablet (50 mg total) by mouth daily.   zinc gluconate 50 MG tablet TAKE 1 TABLET(50 MG) BY MOUTH DAILY   No facility-administered encounter medications on file as of 05/27/2020.   Reviewed chart prior to disease state call. Spoke with patient regarding BP  Recent Office Vitals: BP Readings from Last 3 Encounters:  05/03/20 116/78  02/20/20 128/86  02/13/20 132/88   Pulse Readings from Last 3 Encounters:  05/03/20 73  02/20/20 66  02/13/20 62    Wt Readings from Last 3 Encounters:  05/03/20 205 lb (93 kg)  02/02/20 206 lb (93.4 kg)  01/13/20 204 lb 12.8 oz (92.9 kg)     Kidney  Function Lab Results  Component Value Date/Time   CREATININE 0.88 02/02/2020 03:29 PM   CREATININE 1.10 01/13/2020 08:54 AM   CREATININE 0.69 09/29/2014 02:36 PM   GFR 99.67 02/02/2020 03:29 PM   GFRNONAA >60 01/09/2020 10:45 AM   GFRNONAA >89 09/29/2014 02:36 PM   GFRAA >60 04/27/2017 05:24 AM   GFRAA >89 09/29/2014 02:36 PM    BMP Latest Ref Rng & Units 02/02/2020 01/13/2020 01/09/2020  Glucose 70 - 99 mg/dL 84 97 78  BUN 6 - 23 mg/dL 11 10 12   Creatinine 0.40 - 1.50 mg/dL 0.88 1.10 0.99  Sodium 135 - 145 mEq/L 135 129(L) 127(L)  Potassium 3.5 - 5.1 mEq/L 4.5 4.1 4.3  Chloride 96 - 112 mEq/L 100 94(L) 93(L)  CO2 19 - 32 mEq/L 28 - 24  Calcium 8.4 - 10.5 mg/dL 8.7 - 8.6(L)    Current antihypertensive regimen:  Irbesartan 150 mg Take 1 tablet daily  How often are you checking your Blood Pressure? 1-2x per week   Current home BP readings:     Patient stated he couldn't recall his reading but stated they have not been high, he states they have been good.  What recent interventions/DTPs have been made by any provider to improve Blood Pressure control since last CPP Visit: None noted  Any recent hospitalizations or ED visits since last visit with CPP? No   What diet changes have been made to improve Blood  Pressure Control?  Patient states his wife is cooking lean and greens( chicken and [place])  What exercise is being done to improve your Blood Pressure Control?  Patient states he walked about 10k-15k steps per day.  Adherence Review:  Is the patient currently on ACE/ARB medication? Yes  Does the patient have >5 day gap between last estimated fill dates? No   Irbesartan 150 mg take 1 tablet daily Last fill 04/14/20 90D  Patient states he is upset because he may have to have a 6th back surgery and he states he did not have a good experience in the hospital last time.   Star Rating Drugs: Irbesartan 150 mg 1 tablet by mouth daily    Georgiana Shore ,[place]  Pharmacist Assistant [phone]

## 2020-05-31 ENCOUNTER — Telehealth: Payer: Self-pay | Admitting: Pharmacist

## 2020-05-31 NOTE — Progress Notes (Signed)
    Chronic Care Management Pharmacy Assistant   Name: Bradley [name]  MRN: [id number] DOB: 01-Dec-1970  Reason for Encounter: Chart Review    Medications: Outpatient Encounter Medications as of 05/31/2020  Medication Sig  . Cholecalciferol (VITAMIN D3) 50 MCG (2000 UT) TABS TAKE 2 TABLETS BY MOUTH DAILY  . cyanocobalamin (,VITAMIN B-12,) 1000 MCG/ML injection Inject 1 mL (1,000 mcg total) into the muscle every 30 (thirty) days.  . diazepam (VALIUM) 5 MG tablet Take 1-2 tablets (5-10 mg total) by mouth every 6 (six) hours as needed for muscle spasms.  . eszopiclone (LUNESTA) 2 MG TABS tablet Take 1 tablet (2 mg total) by mouth at bedtime as needed for sleep. Take immediately before bedtime  . HYDROcodone-acetaminophen (NORCO) 10-325 MG tablet Take 1-2 tablets by mouth every 4 (four) hours as needed for moderate pain ((score 4 to 6)).  . INS SYRINGE/NEEDLE 1CC/28G 28G X 1/2" 1 ML MISC Use to inject b12 monthly  . irbesartan (AVAPRO) 150 MG tablet TAKE 1 TABLET(150 MG) BY MOUTH DAILY  . Multiple Vitamin (MULTIVITAMIN) tablet Take 1 tablet by mouth daily.  Marland Kitchen thiamine (VITAMIN B-1) 50 MG tablet Take 1 tablet (50 mg total) by mouth daily.  Marland Kitchen zinc gluconate 50 MG tablet TAKE 1 TABLET(50 MG) BY MOUTH DAILY   No facility-administered encounter medications on file as of 05/31/2020.    Reviewed chart for medication changes and adherence.   No gaps in adherence identified. Patient has follow up scheduled with pharmacy team. No further action required.   [place] [phone]

## 2020-06-08 DIAGNOSIS — G894 Chronic pain syndrome: Secondary | ICD-10-CM | POA: Diagnosis not present

## 2020-06-10 DIAGNOSIS — Z981 Arthrodesis status: Secondary | ICD-10-CM | POA: Diagnosis not present

## 2020-06-11 ENCOUNTER — Telehealth: Payer: Self-pay

## 2020-06-11 NOTE — Progress Notes (Signed)
error 

## 2020-06-21 ENCOUNTER — Other Ambulatory Visit: Payer: Self-pay | Admitting: Internal Medicine

## 2020-06-21 DIAGNOSIS — G8929 Other chronic pain: Secondary | ICD-10-CM

## 2020-06-21 DIAGNOSIS — G4701 Insomnia due to medical condition: Secondary | ICD-10-CM

## 2020-06-22 ENCOUNTER — Encounter: Payer: Self-pay | Admitting: Internal Medicine

## 2020-06-22 ENCOUNTER — Other Ambulatory Visit: Payer: Self-pay | Admitting: Internal Medicine

## 2020-06-24 ENCOUNTER — Telehealth: Payer: Self-pay | Admitting: Pharmacist

## 2020-06-24 NOTE — Progress Notes (Signed)
Chronic Care Management Pharmacy Assistant   Name: Bradley [name]  MRN: [id number] DOB: [birth year]   Reason for Encounter: Hypertension Disease State Call   Conditions to be addressed/monitored: HTN   Recent office visits:  None ID  Recent consult visits:  None ID  Hospital visits:  Medication Reconciliation was completed by comparing discharge summary, patient's EMR and Pharmacy list, and upon discussion with patient.  Admitted to the hospital on 01/13/20 due to degenerative spondylolisthesis. Discharge date was 01/14/20. Discharged from [facility] [place]?Medications Started at [facility] Discharge:?? -started diazepam and hydrocodone due to none id  Medication Changes at Hospital Discharge: -Changed nine ID  Medications Discontinued at Hospital Discharge: -Stopped none ID  Medications that remain the same after Hospital Discharge:??  -All other medications will remain the same.    Medications: Outpatient Encounter Medications as of 06/24/2020  Medication Sig  . Cholecalciferol (VITAMIN D3) 50 MCG (2000 UT) TABS TAKE 2 TABLETS BY MOUTH DAILY  . cyanocobalamin (,VITAMIN B-12,) 1000 MCG/ML injection Inject 1 mL (1,000 mcg total) into the muscle every 30 (thirty) days.  . diazepam (VALIUM) 5 MG tablet Take 1-2 tablets (5-10 mg total) by mouth every 6 (six) hours as needed for muscle spasms.  . eszopiclone (LUNESTA) 2 MG TABS tablet TAKE 1 TABLET(2 MG) BY MOUTH AT BEDTIME AS NEEDED FOR SLEEP  . HYDROcodone-acetaminophen (NORCO) 10-325 MG tablet Take 1-2 tablets by mouth every 4 (four) hours as needed for moderate pain ((score 4 to 6)).  . INS SYRINGE/NEEDLE 1CC/28G 28G X 1/2" 1 ML MISC Use to inject b12 monthly  . irbesartan (AVAPRO) 150 MG tablet TAKE 1 TABLET(150 MG) BY MOUTH DAILY  . Multiple Vitamin (MULTIVITAMIN) tablet Take 1 tablet by mouth daily.  Marland Kitchen thiamine (VITAMIN B-1) 50 MG tablet Take 1 tablet (50 mg total) by mouth daily.  Marland Kitchen zinc gluconate 50 MG  tablet TAKE 1 TABLET(50 MG) BY MOUTH DAILY   No facility-administered encounter medications on file as of 06/24/2020.   Reviewed chart prior to disease state call. Spoke with patient regarding BP  Recent Office Vitals: BP Readings from Last 3 Encounters:  05/03/20 116/78  02/20/20 128/86  02/13/20 132/88   Pulse Readings from Last 3 Encounters:  05/03/20 73  02/20/20 66  02/13/20 62    Wt Readings from Last 3 Encounters:  05/03/20 205 lb (93 kg)  02/02/20 206 lb (93.4 kg)  01/13/20 204 lb 12.8 oz (92.9 kg)     Kidney Function Lab Results  Component Value Date/Time   CREATININE 0.88 02/02/2020 03:29 PM   CREATININE 1.10 01/13/2020 08:54 AM   CREATININE 0.69 09/29/2014 02:36 PM   GFR 99.67 02/02/2020 03:29 PM   GFRNONAA >60 01/09/2020 10:45 AM   GFRNONAA >89 09/29/2014 02:36 PM   GFRAA >60 04/27/2017 05:24 AM   GFRAA >89 09/29/2014 02:36 PM    BMP Latest Ref Rng & Units 02/02/2020 01/13/2020 01/09/2020  Glucose 70 - 99 mg/dL 84 97 78  BUN 6 - 23 mg/dL 11 10 12   Creatinine 0.40 - 1.50 mg/dL 0.88 1.10 0.99  Sodium 135 - 145 mEq/L 135 129(L) 127(L)  Potassium 3.5 - 5.1 mEq/L 4.5 4.1 4.3  Chloride 96 - 112 mEq/L 100 94(L) 93(L)  CO2 19 - 32 mEq/L 28 - 24  Calcium 8.4 - 10.5 mg/dL 8.7 - 8.6(L)    . Current antihypertensive regimen:  Patient is taking irbesartan 150 mg 1 tab daily   . How often are  you checking your Blood Pressure? Patient takes his blood pressure 1 to 2 times a week  . Current home BP readings: Patient states that his last reading was 120/70  . What recent interventions/DTPs have been made by any provider to improve Blood Pressure control since last CPP Visit: Patient is to continue current medications  . Any recent hospitalizations or ED visits since last visit with CPP? Patient was seen at [facility] in November    . What diet changes have been made to improve Blood Pressure Control?  Patient states that he cooks lean and green, no red  meats  . What exercise is being done to improve your Blood Pressure Control?  Patient states that he is active around the house taking care of 2 toddlers. He gets in about 10-15,000 steps daily  Adherence Review: Is the patient currently on ACE/ARB medication? Yes, Irbesartan Does the patient have >5 day gap between last estimated fill dates? No   Star Rating Drugs: Irbesartan 04/14/20 90 ds  [place] Pharmacist Assistant [phone]  Time spent:25

## 2020-07-01 ENCOUNTER — Telehealth: Payer: Self-pay

## 2020-07-01 NOTE — Telephone Encounter (Signed)
Pt advised that insurance covers AWV to discuss annual preventative measures.  Pt states he has a physical with PCP on 07/15/20.  AWV appt scheduled at 2p before 3:20 appt with PCP.  Pt aware & verb understanding.

## 2020-07-04 NOTE — Progress Notes (Signed)
Cardiology Office Note   Date:  07/05/2020   ID:  Bradley [name], DOB [birth year], MRN [id number]  PCP:  [name], MD  Cardiologist:   [name], MD   Pt presents for f/u of HTN   History of Present Illness: Bradley [name] is a 50 y.o. male with a history of HTN, obesity (s/p gastric bypass) and murmur  I saw him in 2016  Echo showed aortic sclerosis  CT did show coronary calcifications.    I last saw the pt in clinic in Spring 2021  SInce seen he deneis CP   He is fairly active  Breathing is OK     Current Outpatient Medications  Medication Sig Dispense Refill  . Buprenorphine HCl-Naloxone HCl 4-1 MG FILM Place 1 strip under the tongue 2 (two) times daily.    . Cholecalciferol (VITAMIN D3) 50 MCG (2000 UT) TABS TAKE 2 TABLETS BY MOUTH DAILY 180 tablet 1  . cyanocobalamin (,VITAMIN B-12,) 1000 MCG/ML injection Inject 1 mL (1,000 mcg total) into the muscle every 30 (thirty) days. 3 mL 3  . eszopiclone (LUNESTA) 2 MG TABS tablet TAKE 1 TABLET(2 MG) BY MOUTH AT BEDTIME AS NEEDED FOR SLEEP 30 tablet 0  . INS SYRINGE/NEEDLE 1CC/28G 28G X 1/2" 1 ML MISC Use to inject b12 monthly 10 each 1  . irbesartan (AVAPRO) 150 MG tablet TAKE 1 TABLET(150 MG) BY MOUTH DAILY 90 tablet 0  . Multiple Vitamin (MULTIVITAMIN) tablet Take 1 tablet by mouth daily.    Marland Kitchen thiamine (VITAMIN B-1) 50 MG tablet Take 1 tablet (50 mg total) by mouth daily. 90 tablet 1  . zinc gluconate 50 MG tablet TAKE 1 TABLET(50 MG) BY MOUTH DAILY 90 tablet 0   No current facility-administered medications for this visit.    Allergies:   Codeine   Past Medical History:  Diagnosis Date  . Allergy   . Anemia    since gastric bypass  . Anxiety   . Aortic valve sclerosis   . Arthritis   . Arthropathy, unspecified, other specified sites   . CAD (coronary artery disease)    01/09/20: patient denied; faint coronary calcifications on 2016 CT  . Contact with or exposure to venereal diseases   . Depression   .  Displacement of intervertebral disc, site unspecified, without myelopathy   . GERD (gastroesophageal reflux disease)   . Heart murmur    mild MR, mild AS 06/2019 (Dr. [name])  . History of kidney stones   . Hypertension     " no longer have hypertension since gastric bypass "  . Lumbago   . Obesity, unspecified   . Other, mixed, or unspecified nondependent drug abuse, unspecified   . Personal history of urinary calculi   . Pneumonia   . Priapism   . Stenosis, spinal, lumbar     Past Surgical History:  Procedure Laterality Date  . ABDOMINAL SURGERY    . ANKLE SURGERY    . ANTERIOR LAT LUMBAR FUSION Left 02/24/2016   Procedure: LEFT LUMBAR TWO-THREE  ANTERIOR LATERAL LUMBAR FUSION  ;  Surgeon: Earnie Larsson, MD;  Location: [place];  Service: Neurosurgery;  Laterality: Left;  . ANTERIOR LATERAL LUMBAR FUSION WITH PERCUTANEOUS SCREW 1 LEVEL Right 01/13/2020   Procedure: RIGHT LUMBAR ONE-TWO ANTERIOR LATERAL LUMBAR FUSION WITH PERCUTANEOUS SCREW PLACEMENT;  Surgeon: Earnie Larsson, MD;  Location: [place];  Service: Neurosurgery;  Laterality: Right;  3C  . BACK SURGERY    . GASTRIC BYPASS    .  GASTRIC BYPASS  2008  . HERNIA REPAIR    . INCISION AND DRAINAGE ABSCESS Right 07/06/2004   great toe  . IRRIGATION AND DEBRIDEMENT ABSCESS Right 07/08/2004   great toe  . KNEE ARTHROPLASTY Right 04/26/2017   Procedure: RIGHT TOTAL KNEE ARTHROPLASTY WITH COMPUTER NAVIGATION;  Surgeon: Rod Can, MD;  Location: [facility];  Service: Orthopedics;  Laterality: Right;  Needs RNFA  . KNEE SURGERY    . LUMBAR PERCUTANEOUS PEDICLE SCREW 1 LEVEL Left 02/24/2016   Procedure: LUMBAR PERCUTANEOUS PEDICLE SCREW 1 LEVEL;  Surgeon: Earnie Larsson, MD;  Location: [place];  Service: Neurosurgery;  Laterality: Left;  Marland Kitchen MICRODISCECTOMY LUMBAR Left 08/24/2008   T12 - L1  . panectomy    . TOTAL KNEE ARTHROPLASTY Right 04/2017     Social History:  The patient  reports that he has never smoked. He has never used smokeless  tobacco. He reports that he does not drink alcohol and does not use drugs.   Family History:  The patient's family history includes Arthritis in his mother; Breast cancer in his mother; Colon cancer in his mother; Heart disease in his father; Hypertension in his father; Kidney failure in his mother.    ROS:  Please see the history of present illness. All other systems are reviewed and  Negative to the above problem except as noted.    PHYSICAL EXAM: VS:  BP 130/80   Pulse 60   Ht 5' 10"  (1.778 m)   Wt 209 lb (94.8 kg)   SpO2 99%   BMI 29.99 kg/m   GEN: Overweight 50 yo in no acute distress  HEENT: normal  Neck: JVP normal  , no carotid bruits, Cardiac: RRR; Gr I/VI systolic murmur apex  No LE edema  2+ pulses   Respiratory:  clear to auscultation bilaterally, normal work of breathing GI: soft, nontender, nondistended, + BS  No hepatomegaly  MS: no deformity Moving all extremities   Skin: warm and dry, no rash Neuro:  Strength and sensation are intact Psych: euthymic mood, full affect   EKG:  EKG is  ordered today     SR 60  bpm    Echo   April 2021  Left ventricular ejection fraction, by estimation, is 55 to 60%. The left ventricle has normal function. The left ventricle has no regional wall motion abnormalities. There is mild left ventricular hypertrophy. Left ventricular diastolic parameters were normal. 2. Right ventricular systolic function is normal. The right ventricular size is normal. There is mildly elevated pulmonary artery systolic pressure. 3. The mitral valve is grossly normal. Mild mitral valve regurgitation. 4. AV is thickened, calcified with very mildly restricted motion. Peak and mean gradients thruogh the valve are 23 and 11 mm Hg respectively consistent with mild AS.Marland Kitchen The aortic valve is tricuspid. Aortic valve regurgitation is not visualized. 5. The inferior vena cava is normal in size with greater than 50% respiratory variability, suggesting right atrial  pressure of 3 mmHg.  Lipid Panel    Component Value Date/Time   CHOL 133 04/28/2019 1138   TRIG 67.0 04/28/2019 1138   HDL 54.80 04/28/2019 1138   CHOLHDL 2 04/28/2019 1138   VLDL 13.4 04/28/2019 1138   LDLCALC 65 04/28/2019 1138      Wt Readings from Last 3 Encounters:  07/05/20 209 lb (94.8 kg)  05/03/20 205 lb (93 kg)  02/02/20 206 lb (93.4 kg)      ASSESSMENT AND PLAN: 1  CAD Coronary calcifications on CT in past  No symptoms of ischemia/angina.  Remains asymtpomatic   Follow      3  HL Will get lipids today   Not on statin  4  HTN  BP is adeq controlled   Follow    5  Aortic valve stenosis   Mild on last echo in 2021    Follow  .   F/U in 1 year   Signed, [name], MD  07/05/2020 1:44 PM    [facility] [place], [place], [place]  [address] Phone: [phone]; Fax: [phone]

## 2020-07-05 ENCOUNTER — Other Ambulatory Visit: Payer: Self-pay

## 2020-07-05 ENCOUNTER — Ambulatory Visit: Payer: Medicare Other | Admitting: Internal Medicine

## 2020-07-05 ENCOUNTER — Encounter: Payer: Self-pay | Admitting: Internal Medicine

## 2020-07-05 VITALS — BP 130/80 | HR 60 | Ht 70.0 in | Wt 209.0 lb

## 2020-07-05 DIAGNOSIS — E559 Vitamin D deficiency, unspecified: Secondary | ICD-10-CM | POA: Diagnosis not present

## 2020-07-05 DIAGNOSIS — Z79899 Other long term (current) drug therapy: Secondary | ICD-10-CM | POA: Diagnosis not present

## 2020-07-05 DIAGNOSIS — Z0000 Encounter for general adult medical examination without abnormal findings: Secondary | ICD-10-CM

## 2020-07-05 DIAGNOSIS — E785 Hyperlipidemia, unspecified: Secondary | ICD-10-CM | POA: Diagnosis not present

## 2020-07-05 DIAGNOSIS — R011 Cardiac murmur, unspecified: Secondary | ICD-10-CM | POA: Diagnosis not present

## 2020-07-05 LAB — PSA: PSA: 0.6

## 2020-07-05 NOTE — Patient Instructions (Signed)
Medication Instructions:  Your physician recommends that you continue on your current medications as directed. Please refer to the Current Medication list given to you today.  *If you need a refill on your cardiac medications before your next appointment, please call your pharmacy*   Lab Work: CMET, CBC, TSH, LIPID, VIT D, PSA If you have labs (blood work) drawn today and your tests are completely normal, you will receive your results only by: Marland Kitchen MyChart Message (if you have MyChart) OR . A paper copy in the mail If you have any lab test that is abnormal or we need to change your treatment, we will call you to review the results.   Testing/Procedures: NONE   Follow-Up: At [facility], you and your health needs are our priority.  As part of our continuing mission to provide you with exceptional heart care, we have created designated Provider Care Teams.  These Care Teams include your primary Cardiologist (physician) and Advanced Practice Providers (APPs -  Physician Assistants and Nurse Practitioners) who all work together to provide you with the care you need, when you need it.  We recommend signing up for the patient portal called "MyChart".  Sign up information is provided on this After Visit Summary.  MyChart is used to connect with patients for Virtual Visits (Telemedicine).  Patients are able to view lab/test results, encounter notes, upcoming appointments, etc.  Non-urgent messages can be sent to your provider as well.   To learn more about what you can do with MyChart, go to [email or web address].    Your next appointment:   4 month(s)  The format for your next appointment:   In Person  Provider:   [name], MD   Other Instructions

## 2020-07-06 ENCOUNTER — Telehealth: Payer: Self-pay

## 2020-07-06 LAB — COMPREHENSIVE METABOLIC PANEL
ALT: 24 IU/L (ref 0–44)
AST: 18 IU/L (ref 0–40)
Albumin/Globulin Ratio: 1.7 (ref 1.2–2.2)
Albumin: 3.9 g/dL (ref 3.8–4.9)
Alkaline Phosphatase: 104 IU/L (ref 44–121)
BUN/Creatinine Ratio: 16 (ref 9–20)
BUN: 15 mg/dL (ref 6–24)
Bilirubin Total: 0.4 mg/dL (ref 0.0–1.2)
CO2: 24 mmol/L (ref 20–29)
Calcium: 8.8 mg/dL (ref 8.7–10.2)
Chloride: 106 mmol/L (ref 96–106)
Creatinine, Ser: 0.92 mg/dL (ref 0.76–1.27)
Globulin, Total: 2.3 g/dL (ref 1.5–4.5)
Glucose: 83 mg/dL (ref 65–99)
Potassium: 5.5 mmol/L — ABNORMAL HIGH (ref 3.5–5.2)
Sodium: 141 mmol/L (ref 134–144)
Total Protein: 6.2 g/dL (ref 6.0–8.5)
eGFR: 101 mL/min/{1.73_m2} (ref >59)

## 2020-07-06 LAB — VITAMIN D 25 HYDROXY (VIT D DEFICIENCY, FRACTURES): Vit D, 25-Hydroxy: 20.7 ng/mL — ABNORMAL LOW (ref 30.0–100.0)

## 2020-07-06 LAB — CBC
Hematocrit: 38.8 % (ref 37.5–51.0)
Hemoglobin: 12.6 g/dL — ABNORMAL LOW (ref 13.0–17.7)
MCH: 28.3 pg (ref 26.6–33.0)
MCHC: 32.5 g/dL (ref 31.5–35.7)
MCV: 87 fL (ref 79–97)
Platelets: 257 10*3/uL (ref 150–450)
RBC: 4.46 x10E6/uL (ref 4.14–5.80)
RDW: 13.7 % (ref 11.6–15.4)
WBC: 6 10*3/uL (ref 3.4–10.8)

## 2020-07-06 LAB — LIPID PANEL
Chol/HDL Ratio: 2.5 ratio (ref 0.0–5.0)
Cholesterol, Total: 138 mg/dL (ref 100–199)
HDL: 55 mg/dL (ref >39)
LDL Chol Calc (NIH): 71 mg/dL (ref 0–99)
Triglycerides: 53 mg/dL (ref 0–149)
VLDL Cholesterol Cal: 12 mg/dL (ref 5–40)

## 2020-07-06 LAB — PSA: Prostate Specific Ag, Serum: 0.6 ng/mL (ref 0.0–4.0)

## 2020-07-06 LAB — TSH: TSH: 3.91 u[IU]/mL (ref 0.450–4.500)

## 2020-07-06 NOTE — Telephone Encounter (Signed)
Spoke with pt and advised of lab results per Dr Harrington Challenger.  Pt states he has an appointment with his PCP on 05/12 and he will request that K+ be rechecked at that visit.  Labs forwarded to PCP for review as well.  Pt verbalizes understanding and thanked Therapist, sports for the call.

## 2020-07-06 NOTE — Telephone Encounter (Signed)
-----   Message from [name] V, MD sent at 07/06/2020  8:57 AM EDT ----- CBC is OK Thyroid function is normal Kidney function is normal Potassium is a little high   Has not been high before    ? If sl hemolyzed sample Lipids are very good   LDL 71   HDL 55 WOuld repeat BMET in the next several days to check K  , make sure it is not still high

## 2020-07-10 ENCOUNTER — Other Ambulatory Visit: Payer: Self-pay | Admitting: Internal Medicine

## 2020-07-10 DIAGNOSIS — I10 Essential (primary) hypertension: Secondary | ICD-10-CM

## 2020-07-13 DIAGNOSIS — Z79891 Long term (current) use of opiate analgesic: Secondary | ICD-10-CM | POA: Diagnosis not present

## 2020-07-13 DIAGNOSIS — G894 Chronic pain syndrome: Secondary | ICD-10-CM | POA: Diagnosis not present

## 2020-07-13 DIAGNOSIS — M5459 Other low back pain: Secondary | ICD-10-CM | POA: Diagnosis not present

## 2020-07-15 ENCOUNTER — Other Ambulatory Visit: Payer: Self-pay

## 2020-07-15 ENCOUNTER — Other Ambulatory Visit (INDEPENDENT_AMBULATORY_CARE_PROVIDER_SITE_OTHER): Payer: Medicare Other

## 2020-07-15 ENCOUNTER — Ambulatory Visit: Payer: Medicare Other

## 2020-07-15 ENCOUNTER — Ambulatory Visit (INDEPENDENT_AMBULATORY_CARE_PROVIDER_SITE_OTHER): Payer: Medicare Other | Admitting: Internal Medicine

## 2020-07-15 ENCOUNTER — Other Ambulatory Visit: Payer: Self-pay | Admitting: Internal Medicine

## 2020-07-15 ENCOUNTER — Encounter: Payer: Self-pay | Admitting: Internal Medicine

## 2020-07-15 VITALS — BP 124/82 | HR 70 | Temp 98.0°F | Ht 70.0 in | Wt 208.0 lb

## 2020-07-15 DIAGNOSIS — E60 Dietary zinc deficiency: Secondary | ICD-10-CM | POA: Diagnosis not present

## 2020-07-15 DIAGNOSIS — E559 Vitamin D deficiency, unspecified: Secondary | ICD-10-CM | POA: Diagnosis not present

## 2020-07-15 DIAGNOSIS — D528 Other folate deficiency anemias: Secondary | ICD-10-CM | POA: Diagnosis not present

## 2020-07-15 DIAGNOSIS — E519 Thiamine deficiency, unspecified: Secondary | ICD-10-CM

## 2020-07-15 DIAGNOSIS — D510 Vitamin B12 deficiency anemia due to intrinsic factor deficiency: Secondary | ICD-10-CM

## 2020-07-15 DIAGNOSIS — D508 Other iron deficiency anemias: Secondary | ICD-10-CM

## 2020-07-15 DIAGNOSIS — I10 Essential (primary) hypertension: Secondary | ICD-10-CM | POA: Diagnosis not present

## 2020-07-15 DIAGNOSIS — G4701 Insomnia due to medical condition: Secondary | ICD-10-CM | POA: Diagnosis not present

## 2020-07-15 DIAGNOSIS — Z9884 Bariatric surgery status: Secondary | ICD-10-CM | POA: Diagnosis not present

## 2020-07-15 DIAGNOSIS — Z0000 Encounter for general adult medical examination without abnormal findings: Secondary | ICD-10-CM | POA: Diagnosis not present

## 2020-07-15 DIAGNOSIS — G8929 Other chronic pain: Secondary | ICD-10-CM | POA: Diagnosis not present

## 2020-07-15 LAB — CBC WITH DIFFERENTIAL/PLATELET
Basophils Absolute: 0 10*3/uL (ref 0.0–0.1)
Basophils Relative: 0.2 % (ref 0.0–3.0)
Eosinophils Absolute: 0.1 10*3/uL (ref 0.0–0.7)
Eosinophils Relative: 1.3 % (ref 0.0–5.0)
HCT: 38.3 % — ABNORMAL LOW (ref 39.0–52.0)
Hemoglobin: 12.6 g/dL — ABNORMAL LOW (ref 13.0–17.0)
Lymphocytes Relative: 36.4 % (ref 12.0–46.0)
Lymphs Abs: 2.3 10*3/uL (ref 0.7–4.0)
MCHC: 33 g/dL (ref 30.0–36.0)
MCV: 85.1 fl (ref 78.0–100.0)
Monocytes Absolute: 0.6 10*3/uL (ref 0.1–1.0)
Monocytes Relative: 9.8 % (ref 3.0–12.0)
Neutro Abs: 3.4 10*3/uL (ref 1.4–7.7)
Neutrophils Relative %: 52.3 % (ref 43.0–77.0)
Platelets: 205 10*3/uL (ref 150.0–400.0)
RBC: 4.51 Mil/uL (ref 4.22–5.81)
RDW: 14.7 % (ref 11.5–15.5)
WBC: 6.4 10*3/uL (ref 4.0–10.5)

## 2020-07-15 LAB — BASIC METABOLIC PANEL
BUN: 14 mg/dL (ref 6–23)
CO2: 26 mEq/L (ref 19–32)
Calcium: 8.5 mg/dL (ref 8.4–10.5)
Chloride: 102 mEq/L (ref 96–112)
Creatinine, Ser: 0.95 mg/dL (ref 0.40–1.50)
GFR: 92.48 mL/min (ref >60.00)
Glucose, Bld: 96 mg/dL (ref 70–99)
Potassium: 4.7 mEq/L (ref 3.5–5.1)
Sodium: 134 mEq/L — ABNORMAL LOW (ref 135–145)

## 2020-07-15 LAB — VITAMIN B12: Vitamin B-12: 471 pg/mL (ref 211–911)

## 2020-07-15 LAB — FERRITIN: Ferritin: 14 ng/mL — ABNORMAL LOW (ref 22.0–322.0)

## 2020-07-15 LAB — VITAMIN D 25 HYDROXY (VIT D DEFICIENCY, FRACTURES): VITD: 32.12 ng/mL (ref 30.00–100.00)

## 2020-07-15 LAB — FOLATE: Folate: 5.3 ng/mL — ABNORMAL LOW (ref >5.9)

## 2020-07-15 MED ORDER — ACCRUFER 30 MG PO CAPS
1.0000 | ORAL_CAPSULE | Freq: Two times a day (BID) | ORAL | 1 refills | Status: DC
Start: 2020-07-15 — End: 2020-07-16

## 2020-07-15 MED ORDER — ZINC GLUCONATE 50 MG PO TABS
50.0000 mg | ORAL_TABLET | Freq: Every day | ORAL | 0 refills | Status: DC
Start: 2020-07-15 — End: 2021-02-03

## 2020-07-15 MED ORDER — FOLIC ACID 1 MG PO TABS: 1.0000 mg | ORAL_TABLET | Freq: Every day | ORAL | 1 refills | Status: DC

## 2020-07-15 MED ORDER — IRBESARTAN 150 MG PO TABS: 150.0000 mg | ORAL_TABLET | Freq: Every day | ORAL | 0 refills | Status: DC

## 2020-07-15 MED ORDER — ESZOPICLONE 3 MG PO TABS: 3.0000 mg | ORAL_TABLET | Freq: Every day | ORAL | 1 refills | Status: DC

## 2020-07-15 NOTE — Progress Notes (Signed)
Subjective:  Patient ID: Bradley [name], male    DOB: January 17, 1971  Age: 50 y.o. MRN: [id number]  CC: Anemia, Annual Exam, and Hypertension  This visit occurred during the SARS-CoV-2 public health emergency.  Safety protocols were in place, including screening questions prior to the visit, additional usage of staff PPE, and extensive cleaning of exam room while observing appropriate contact time as indicated for disinfecting solutions.    HPI Bradley [name] presents for a CPX and f/up -   He recently saw his cardiologist soon and was found to have a mildly elevated potassium level.  He is taking an ARB.  He tells me his blood pressure is well controlled.  He tells me the current dose of Lunesta is not getting him through the night with respect to insomnia.  He would like to try higher dosage.  Outpatient Medications Prior to Visit  Medication Sig Dispense Refill  . Buprenorphine HCl-Naloxone HCl 4-1 MG FILM Place 1 strip under the tongue 2 (two) times daily.    . Cholecalciferol (VITAMIN D3) 50 MCG (2000 UT) TABS TAKE 2 TABLETS BY MOUTH DAILY 180 tablet 1  . cyanocobalamin (,VITAMIN B-12,) 1000 MCG/ML injection Inject 1 mL (1,000 mcg total) into the muscle every 30 (thirty) days. 3 mL 3  . INS SYRINGE/NEEDLE 1CC/28G 28G X 1/2" 1 ML MISC Use to inject b12 monthly 10 each 1  . Multiple Vitamin (MULTIVITAMIN) tablet Take 1 tablet by mouth daily.    Marland Kitchen thiamine (VITAMIN B-1) 50 MG tablet Take 1 tablet (50 mg total) by mouth daily. 90 tablet 1  . eszopiclone (LUNESTA) 2 MG TABS tablet TAKE 1 TABLET(2 MG) BY MOUTH AT BEDTIME AS NEEDED FOR SLEEP 30 tablet 0  . irbesartan (AVAPRO) 150 MG tablet TAKE 1 TABLET(150 MG) BY MOUTH DAILY 90 tablet 0  . zinc gluconate 50 MG tablet TAKE 1 TABLET(50 MG) BY MOUTH DAILY 90 tablet 0   No facility-administered medications prior to visit.    ROS Review of Systems  Constitutional: Negative.  Negative for appetite change, diaphoresis, fatigue and unexpected  weight change.  HENT: Negative.  Negative for trouble swallowing.   Eyes: Negative.   Respiratory: Negative for cough, chest tightness, shortness of breath and wheezing.   Cardiovascular: Negative for chest pain, palpitations and leg swelling.  Gastrointestinal: Negative for abdominal pain, blood in stool, constipation, diarrhea, nausea and vomiting.  Endocrine: Negative.   Genitourinary: Negative.  Negative for difficulty urinating and penile swelling.  Musculoskeletal: Positive for arthralgias and back pain. Negative for myalgias.  Skin: Positive for pallor. Negative for color change.  Neurological: Negative.  Negative for dizziness, weakness, light-headedness and headaches.  Hematological: Negative for adenopathy. Does not bruise/bleed easily.  Psychiatric/Behavioral: Positive for sleep disturbance. Negative for agitation, decreased concentration and dysphoric mood. The patient is not nervous/anxious.     Objective:  BP 124/82 (BP Location: Left Arm, Patient Position: Sitting, Cuff Size: Large)   Pulse 70   Temp 98 F (36.7 C) (Oral)   Ht 5' 10"  (1.778 m)   Wt 208 lb (94.3 kg)   SpO2 98%   BMI 29.84 kg/m   BP Readings from Last 3 Encounters:  07/15/20 124/82  07/05/20 130/80  05/03/20 116/78    Wt Readings from Last 3 Encounters:  07/15/20 208 lb (94.3 kg)  07/05/20 209 lb (94.8 kg)  05/03/20 205 lb (93 kg)    Physical Exam Vitals reviewed.  HENT:     Nose: Nose normal.  Mouth/Throat:     Mouth: Mucous membranes are moist.  Eyes:     General: No scleral icterus.    Conjunctiva/sclera: Conjunctivae normal.  Cardiovascular:     Rate and Rhythm: Normal rate and regular rhythm.     Heart sounds: Murmur heard.   Systolic murmur is present with a grade of 2/6.  No diastolic murmur is present. No gallop.   Pulmonary:     Effort: Pulmonary effort is normal.     Breath sounds: Normal breath sounds.  Abdominal:     General: Abdomen is flat.     Palpations: There  is no mass.     Tenderness: There is no abdominal tenderness.  Musculoskeletal:        General: Normal range of motion.     Cervical back: Neck supple.     Right lower leg: No edema.     Left lower leg: No edema.  Lymphadenopathy:     Cervical: No cervical adenopathy.  Skin:    General: Skin is warm and dry.  Neurological:     General: No focal deficit present.     Mental Status: He is alert and oriented to person, place, and time.  Psychiatric:        Mood and Affect: Mood normal.        Behavior: Behavior normal.     Lab Results  Component Value Date   WBC 6.4 07/15/2020   HGB 12.6 (L) 07/15/2020   HCT 38.3 (L) 07/15/2020   PLT 205.0 07/15/2020   GLUCOSE 96 07/15/2020   CHOL 138 07/05/2020   TRIG 53 07/05/2020   HDL 55 07/05/2020   LDLCALC 71 07/05/2020   ALT 24 07/05/2020   AST 18 07/05/2020   NA 134 (L) 07/15/2020   K 4.7 07/15/2020   CL 102 07/15/2020   CREATININE 0.95 07/15/2020   BUN 14 07/15/2020   CO2 26 07/15/2020   TSH 3.910 07/05/2020   PSA 0.6 07/05/2020   HGBA1C 5.7 10/25/2017    No results found.  Assessment & Plan:   Bradley [name] was seen today for anemia, annual exam and hypertension.  Diagnoses and all orders for this visit:  Essential hypertension- His blood pressure is adequately well controlled.  Will continue the current dose of the ARB. -     Basic metabolic panel; Future -     irbesartan (AVAPRO) 150 MG tablet; Take 1 tablet (150 mg total) by mouth daily.  Vitamin B12 deficiency anemia due to intrinsic factor deficiency- He will continue parenteral B12 replacement therapy. -     Vitamin B12; Future -     Folate; Future  Other iron deficiency anemia- He is anemic and his iron level is low.  I recommended that he restart oral iron supplementation. -     CBC with Differential/Platelet; Future -     Ferritin; Future -     Iron; Future -     Discontinue: Ferric Maltol (ACCRUFER) 30 MG CAPS; Take 1 capsule by mouth in the morning and at  bedtime. -     ferrous sulfate 220 (44 Fe) MG/5ML solution; Take 5 mLs (220 mg total) by mouth 3 (three) times daily with meals.  History of gastric bypass -     Vitamin B12; Future -     CBC with Differential/Platelet; Future -     ferrous sulfate 220 (44 Fe) MG/5ML solution; Take 5 mLs (220 mg total) by mouth 3 (three) times daily with meals.  Thiamine deficiency- I  will monitor his thiamine level. -     Vitamin B12; Future -     Vitamin B1; Future  Vitamin D deficiency -     Vitamin B12; Future -     VITAMIN D 25 Hydroxy (Vit-D Deficiency, Fractures); Future  Routine general medical examination at a health care facility- Exam completed, labs reviewed, vaccines reviewed and updated, cancer screenings are up-to-date, patient education was given. -     Cancel: PSA; Future  Zinc deficiency -     zinc gluconate 50 MG tablet; Take 1 tablet (50 mg total) by mouth daily.  Gastric bypass status for obesity -     zinc gluconate 50 MG tablet; Take 1 tablet (50 mg total) by mouth daily. -     Discontinue: Ferric Maltol (ACCRUFER) 30 MG CAPS; Take 1 capsule by mouth in the morning and at bedtime.  Insomnia secondary to chronic pain -     Eszopiclone 3 MG TABS; Take 1 tablet (3 mg total) by mouth at bedtime. Take immediately before bedtime  Other folate deficiency anemias -     folic acid (FOLVITE) 1 MG tablet; Take 1 tablet (1 mg total) by mouth daily.   I have discontinued Tal Kempker. Cozby's eszopiclone and ACCRUFeR. I have also changed his zinc gluconate and irbesartan. Additionally, I am having him start on Eszopiclone, folic acid, and ferrous sulfate. Lastly, I am having him maintain his multivitamin, INS SYRINGE/NEEDLE 1CC/28G, cyanocobalamin, thiamine, Vitamin D3, and Buprenorphine HCl-Naloxone HCl.  Meds ordered this encounter  Medications  . zinc gluconate 50 MG tablet    Sig: Take 1 tablet (50 mg total) by mouth daily.    Dispense:  90 tablet    Refill:  0  . Eszopiclone 3 MG  TABS    Sig: Take 1 tablet (3 mg total) by mouth at bedtime. Take immediately before bedtime    Dispense:  90 tablet    Refill:  1  . irbesartan (AVAPRO) 150 MG tablet    Sig: Take 1 tablet (150 mg total) by mouth daily.    Dispense:  90 tablet    Refill:  0  . DISCONTD: Ferric Maltol (ACCRUFER) 30 MG CAPS    Sig: Take 1 capsule by mouth in the morning and at bedtime.    Dispense:  180 capsule    Refill:  1  . folic acid (FOLVITE) 1 MG tablet    Sig: Take 1 tablet (1 mg total) by mouth daily.    Dispense:  90 tablet    Refill:  1  . ferrous sulfate 220 (44 Fe) MG/5ML solution    Sig: Take 5 mLs (220 mg total) by mouth 3 (three) times daily with meals.    Dispense:  450 mL    Refill:  1     Follow-up: Return in about 6 months (around 01/15/2021).  [name], MD

## 2020-07-15 NOTE — Patient Instructions (Signed)

## 2020-07-16 ENCOUNTER — Encounter: Payer: Self-pay | Admitting: Internal Medicine

## 2020-07-16 MED ORDER — FERROUS SULFATE 220 (44 FE) MG/5ML PO ELIX: 220.0000 mg | ORAL_SOLUTION | Freq: Three times a day (TID) | ORAL | 1 refills | Status: DC

## 2020-07-19 ENCOUNTER — Telehealth: Payer: Self-pay

## 2020-07-19 ENCOUNTER — Other Ambulatory Visit: Payer: Self-pay | Admitting: Internal Medicine

## 2020-07-19 ENCOUNTER — Other Ambulatory Visit (INDEPENDENT_AMBULATORY_CARE_PROVIDER_SITE_OTHER): Payer: Medicare Other

## 2020-07-19 DIAGNOSIS — D508 Other iron deficiency anemias: Secondary | ICD-10-CM

## 2020-07-19 DIAGNOSIS — I10 Essential (primary) hypertension: Secondary | ICD-10-CM

## 2020-07-19 LAB — IRON: Iron: 37 ug/dL — ABNORMAL LOW (ref 42–165)

## 2020-07-19 NOTE — Telephone Encounter (Signed)
Lab add on sheet sent

## 2020-07-19 NOTE — Telephone Encounter (Signed)
-----   Message from [name], MD sent at 07/16/2020 12:47 PM EDT ----- Regarding: iron Can we add an iron level to his labs?  TJ

## 2020-07-20 ENCOUNTER — Telehealth: Payer: Self-pay | Admitting: Internal Medicine

## 2020-07-20 ENCOUNTER — Encounter: Payer: Self-pay | Admitting: Internal Medicine

## 2020-07-20 NOTE — Telephone Encounter (Signed)
Called pt to r/s appt with [facility] on 07/30/20. Patient stated he will call back to r/s. Please reschedule AWV if pt calls the office.

## 2020-07-22 LAB — VITAMIN B1: Vitamin B1 (Thiamine): 56 nmol/L — ABNORMAL HIGH (ref 8–30)

## 2020-07-30 ENCOUNTER — Ambulatory Visit: Payer: Medicare Other

## 2020-08-05 ENCOUNTER — Telehealth: Payer: Self-pay | Admitting: Pharmacist

## 2020-08-12 DIAGNOSIS — M4310 Spondylolisthesis, site unspecified: Secondary | ICD-10-CM | POA: Diagnosis not present

## 2020-08-12 NOTE — Progress Notes (Signed)
    Chronic Care Management Pharmacy Assistant   Name: Bradley [name]  MRN: [id number] DOB: 12/21/1970  Medications: Outpatient Encounter Medications as of 08/05/2020  Medication Sig   Buprenorphine HCl-Naloxone HCl 4-1 MG FILM Place 1 strip under the tongue 2 (two) times daily.   Cholecalciferol (VITAMIN D3) 50 MCG (2000 UT) TABS TAKE 2 TABLETS BY MOUTH DAILY   cyanocobalamin (,VITAMIN B-12,) 1000 MCG/ML injection Inject 1 mL (1,000 mcg total) into the muscle every 30 (thirty) days.   Eszopiclone 3 MG TABS Take 1 tablet (3 mg total) by mouth at bedtime. Take immediately before bedtime   ferrous sulfate 220 (44 Fe) MG/5ML solution Take 5 mLs (220 mg total) by mouth 3 (three) times daily with meals.   folic acid (FOLVITE) 1 MG tablet Take 1 tablet (1 mg total) by mouth daily.   INS SYRINGE/NEEDLE 1CC/28G 28G X 1/2" 1 ML MISC Use to inject b12 monthly   irbesartan (AVAPRO) 150 MG tablet TAKE 1 TABLET(150 MG) BY MOUTH DAILY   Multiple Vitamin (MULTIVITAMIN) tablet Take 1 tablet by mouth daily.   thiamine (VITAMIN B-1) 50 MG tablet Take 1 tablet (50 mg total) by mouth daily.   zinc gluconate 50 MG tablet Take 1 tablet (50 mg total) by mouth daily.   No facility-administered encounter medications on file as of 08/05/2020.   Pharmacy Review  Made 3 attempts to reach patient to review hypertension adherence call. Left voicemail to return call. Unable to reach patient.  [place] Pharmacist Assistant [phone]   Time spent:14

## 2020-08-13 ENCOUNTER — Ambulatory Visit: Payer: Medicare Other

## 2020-08-18 DIAGNOSIS — M4310 Spondylolisthesis, site unspecified: Secondary | ICD-10-CM | POA: Diagnosis not present

## 2020-08-18 DIAGNOSIS — M545 Low back pain, unspecified: Secondary | ICD-10-CM | POA: Diagnosis not present

## 2020-08-25 DIAGNOSIS — M4310 Spondylolisthesis, site unspecified: Secondary | ICD-10-CM | POA: Diagnosis not present

## 2020-08-25 DIAGNOSIS — I10 Essential (primary) hypertension: Secondary | ICD-10-CM | POA: Diagnosis not present

## 2020-08-26 DIAGNOSIS — L638 Other alopecia areata: Secondary | ICD-10-CM | POA: Diagnosis not present

## 2020-09-01 NOTE — Progress Notes (Signed)
    Chronic Care Management Pharmacy Assistant   Name: ADLAI SINNING  MRN: [id number] DOB: [birth year]   Reason for Encounter: Chart Review    Medications: Outpatient Encounter Medications as of 08/05/2020  Medication Sig   Buprenorphine HCl-Naloxone HCl 4-1 MG FILM Place 1 strip under the tongue 2 (two) times daily.   Cholecalciferol (VITAMIN D3) 50 MCG (2000 UT) TABS TAKE 2 TABLETS BY MOUTH DAILY   cyanocobalamin (,VITAMIN B-12,) 1000 MCG/ML injection Inject 1 mL (1,000 mcg total) into the muscle every 30 (thirty) days.   Eszopiclone 3 MG TABS Take 1 tablet (3 mg total) by mouth at bedtime. Take immediately before bedtime   ferrous sulfate 220 (44 Fe) MG/5ML solution Take 5 mLs (220 mg total) by mouth 3 (three) times daily with meals.   folic acid (FOLVITE) 1 MG tablet Take 1 tablet (1 mg total) by mouth daily.   INS SYRINGE/NEEDLE 1CC/28G 28G X 1/2" 1 ML MISC Use to inject b12 monthly   irbesartan (AVAPRO) 150 MG tablet TAKE 1 TABLET(150 MG) BY MOUTH DAILY   Multiple Vitamin (MULTIVITAMIN) tablet Take 1 tablet by mouth daily.   thiamine (VITAMIN B-1) 50 MG tablet Take 1 tablet (50 mg total) by mouth daily.   zinc gluconate 50 MG tablet Take 1 tablet (50 mg total) by mouth daily.   No facility-administered encounter medications on file as of 08/05/2020.   Pharmacist Review  Reviewed chart for medication changes and adherence.  No OVs, Consults, or hospital visits since last care coordination call / Pharmacist visit. No medication changes indicated  No gaps in adherence identified. Patient has follow up scheduled with pharmacy team. No further action required  [place] Pharmacist Assistant [phone]   Time spent:6

## 2020-09-02 ENCOUNTER — Other Ambulatory Visit: Payer: Self-pay | Admitting: Internal Medicine

## 2020-09-02 ENCOUNTER — Encounter: Payer: Self-pay | Admitting: Internal Medicine

## 2020-09-02 DIAGNOSIS — D508 Other iron deficiency anemias: Secondary | ICD-10-CM

## 2020-09-02 MED ORDER — FUSION 65-65-25-30 MG PO CAPS: 1.0000 | ORAL_CAPSULE | Freq: Two times a day (BID) | ORAL | 1 refills | Status: DC

## 2020-09-04 ENCOUNTER — Other Ambulatory Visit: Payer: Self-pay | Admitting: Internal Medicine

## 2020-09-04 DIAGNOSIS — E519 Thiamine deficiency, unspecified: Secondary | ICD-10-CM

## 2020-09-04 DIAGNOSIS — Z9884 Bariatric surgery status: Secondary | ICD-10-CM

## 2020-09-08 DIAGNOSIS — M5126 Other intervertebral disc displacement, lumbar region: Secondary | ICD-10-CM | POA: Diagnosis not present

## 2020-09-08 DIAGNOSIS — M5416 Radiculopathy, lumbar region: Secondary | ICD-10-CM | POA: Diagnosis not present

## 2020-09-09 ENCOUNTER — Ambulatory Visit: Payer: Medicare Other | Admitting: Internal Medicine

## 2020-09-20 ENCOUNTER — Encounter: Payer: Self-pay | Admitting: Internal Medicine

## 2020-09-22 ENCOUNTER — Encounter: Payer: Self-pay | Admitting: Internal Medicine

## 2020-09-22 ENCOUNTER — Ambulatory Visit (INDEPENDENT_AMBULATORY_CARE_PROVIDER_SITE_OTHER): Payer: Medicare Other

## 2020-09-22 ENCOUNTER — Other Ambulatory Visit: Payer: Self-pay

## 2020-09-22 ENCOUNTER — Ambulatory Visit (INDEPENDENT_AMBULATORY_CARE_PROVIDER_SITE_OTHER): Payer: Medicare Other | Admitting: Internal Medicine

## 2020-09-22 VITALS — BP 122/74 | HR 62 | Temp 98.5°F | Resp 16 | Ht 70.0 in | Wt 205.0 lb

## 2020-09-22 DIAGNOSIS — J22 Unspecified acute lower respiratory infection: Secondary | ICD-10-CM | POA: Insufficient documentation

## 2020-09-22 DIAGNOSIS — D510 Vitamin B12 deficiency anemia due to intrinsic factor deficiency: Secondary | ICD-10-CM

## 2020-09-22 DIAGNOSIS — J45991 Cough variant asthma: Secondary | ICD-10-CM

## 2020-09-22 DIAGNOSIS — R059 Cough, unspecified: Secondary | ICD-10-CM | POA: Diagnosis not present

## 2020-09-22 DIAGNOSIS — R0602 Shortness of breath: Secondary | ICD-10-CM | POA: Diagnosis not present

## 2020-09-22 DIAGNOSIS — I10 Essential (primary) hypertension: Secondary | ICD-10-CM | POA: Diagnosis not present

## 2020-09-22 DIAGNOSIS — R058 Other specified cough: Secondary | ICD-10-CM

## 2020-09-22 LAB — CBC WITH DIFFERENTIAL/PLATELET
Basophils Absolute: 0 10*3/uL (ref 0.0–0.1)
Basophils Relative: 0.2 % (ref 0.0–3.0)
Eosinophils Absolute: 0.1 10*3/uL (ref 0.0–0.7)
Eosinophils Relative: 1.7 % (ref 0.0–5.0)
HCT: 39 % (ref 39.0–52.0)
Hemoglobin: 13 g/dL (ref 13.0–17.0)
Lymphocytes Relative: 30.1 % (ref 12.0–46.0)
Lymphs Abs: 2.2 10*3/uL (ref 0.7–4.0)
MCHC: 33.4 g/dL (ref 30.0–36.0)
MCV: 83 fl (ref 78.0–100.0)
Monocytes Absolute: 0.6 10*3/uL (ref 0.1–1.0)
Monocytes Relative: 8.8 % (ref 3.0–12.0)
Neutro Abs: 4.4 10*3/uL (ref 1.4–7.7)
Neutrophils Relative %: 59.2 % (ref 43.0–77.0)
Platelets: 358 10*3/uL (ref 150.0–400.0)
RBC: 4.7 Mil/uL (ref 4.22–5.81)
RDW: 13.9 % (ref 11.5–15.5)
WBC: 7.3 10*3/uL (ref 4.0–10.5)

## 2020-09-22 LAB — BASIC METABOLIC PANEL
BUN: 12 mg/dL (ref 6–23)
CO2: 26 mEq/L (ref 19–32)
Calcium: 8.9 mg/dL (ref 8.4–10.5)
Chloride: 105 mEq/L (ref 96–112)
Creatinine, Ser: 0.82 mg/dL (ref 0.40–1.50)
GFR: 101.36 mL/min (ref >60.00)
Glucose, Bld: 93 mg/dL (ref 70–99)
Potassium: 4.8 mEq/L (ref 3.5–5.1)
Sodium: 138 mEq/L (ref 135–145)

## 2020-09-22 LAB — SARS-COV-2 IGG: SARS-COV-2 IgG: 2.74

## 2020-09-22 MED ORDER — CEFDINIR 300 MG PO CAPS: 300.0000 mg | ORAL_CAPSULE | Freq: Two times a day (BID) | ORAL | 0 refills | Status: AC

## 2020-09-22 MED ORDER — PROMETHAZINE-DM 6.25-15 MG/5ML PO SYRP: 5.0000 mL | ORAL_SOLUTION | Freq: Four times a day (QID) | ORAL | 0 refills | Status: AC | PRN

## 2020-09-22 MED ORDER — METHYLPREDNISOLONE 4 MG PO TBPK: ORAL_TABLET | ORAL | 0 refills | Status: AC

## 2020-09-22 MED ORDER — BUDESONIDE-FORMOTEROL FUMARATE 80-4.5 MCG/ACT IN AERO: 2.0000 | INHALATION_SPRAY | Freq: Two times a day (BID) | RESPIRATORY_TRACT | 1 refills | Status: DC

## 2020-09-22 MED ORDER — CYANOCOBALAMIN 1000 MCG/ML IJ SOLN
1000.0000 ug | Freq: Once | INTRAMUSCULAR | Status: AC
  Administered 2020-09-22: 1000 ug via INTRAMUSCULAR

## 2020-09-22 MED ORDER — TRELEGY ELLIPTA 100-62.5-25 MCG/INH IN AEPB: 1.0000 | INHALATION_SPRAY | Freq: Every day | RESPIRATORY_TRACT | 1 refills | Status: DC

## 2020-09-22 NOTE — Progress Notes (Signed)
Lrti   Subjective:  Patient ID: Bradley [name], male    DOB: December 22, 1970  Age: 50 y.o. MRN: [id number]  CC: Cough  This visit occurred during the SARS-CoV-2 public health emergency.  Safety protocols were in place, including screening questions prior to the visit, additional usage of staff PPE, and extensive cleaning of exam room while observing appropriate contact time as indicated for disinfecting solutions.    HPI Bradley [name] presents for the complaint of a 1 week history of cough that is productive of thick brown to yellow phlegm.  He has had low-grade fever, chills, night sweats, wheezing, and shortness of breath.  Outpatient Medications Prior to Visit  Medication Sig Dispense Refill   Buprenorphine HCl-Naloxone HCl 8-2 MG FILM Place 1 strip under the tongue 2 (two) times daily.     Cholecalciferol (VITAMIN D3) 50 MCG (2000 UT) TABS TAKE 2 TABLETS BY MOUTH DAILY 180 tablet 1   cyanocobalamin (,VITAMIN B-12,) 1000 MCG/ML injection Inject 1 mL (1,000 mcg total) into the muscle every 30 (thirty) days. 3 mL 3   Eszopiclone 3 MG TABS Take 1 tablet (3 mg total) by mouth at bedtime. Take immediately before bedtime 90 tablet 1   Fe Fum-Fe Poly-Vit C-Lactobac (FUSION) 65-65-25-30 MG CAPS Take 1 capsule by mouth in the morning and at bedtime. 180 capsule 1   ferrous sulfate 220 (44 Fe) MG/5ML solution Take 5 mLs (220 mg total) by mouth 3 (three) times daily with meals. 606 mL 1   folic acid (FOLVITE) 1 MG tablet Take 1 tablet (1 mg total) by mouth daily. 90 tablet 1   INS SYRINGE/NEEDLE 1CC/28G 28G X 1/2" 1 ML MISC Use to inject b12 monthly 10 each 1   irbesartan (AVAPRO) 150 MG tablet TAKE 1 TABLET(150 MG) BY MOUTH DAILY 90 tablet 0   Multiple Vitamin (MULTIVITAMIN) tablet Take 1 tablet by mouth daily.     thiamine (VITAMIN B-1) 50 MG tablet TAKE 1 TABLET BY MOUTH DAILY 90 tablet 1   zinc gluconate 50 MG tablet Take 1 tablet (50 mg total) by mouth daily. 90 tablet 0   No  facility-administered medications prior to visit.    ROS Review of Systems  Constitutional:  Positive for chills and fever. Negative for diaphoresis and fatigue.  HENT: Negative.  Negative for sore throat.   Eyes: Negative.   Respiratory:  Positive for cough, shortness of breath and wheezing. Negative for chest tightness and stridor.   Cardiovascular:  Negative for chest pain, palpitations and leg swelling.  Gastrointestinal:  Negative for abdominal pain, constipation, diarrhea, nausea and vomiting.  Endocrine: Negative.   Genitourinary: Negative.  Negative for difficulty urinating.  Musculoskeletal: Negative.  Negative for back pain and myalgias.  Skin: Negative.  Negative for color change and rash.  Neurological:  Negative for dizziness, weakness, light-headedness, numbness and headaches.  Hematological:  Negative for adenopathy. Does not bruise/bleed easily.  Psychiatric/Behavioral: Negative.     Objective:  BP 122/74 (BP Location: Right Arm, Patient Position: Sitting, Cuff Size: Large)   Pulse 62   Temp 98.5 F (36.9 C) (Oral)   Resp 16   Ht 5' 10"  (1.778 m)   Wt 205 lb (93 kg)   SpO2 96%   BMI 29.41 kg/m   BP Readings from Last 3 Encounters:  09/22/20 122/74  07/15/20 124/82  07/05/20 130/80    Wt Readings from Last 3 Encounters:  09/22/20 205 lb (93 kg)  07/15/20 208 lb (94.3 kg)  07/05/20 209  lb (94.8 kg)    Physical Exam Vitals reviewed.  Constitutional:      General: He is not in acute distress.    Appearance: Normal appearance. He is not ill-appearing, toxic-appearing or diaphoretic.  HENT:     Nose: Nose normal.     Mouth/Throat:     Mouth: Mucous membranes are moist.  Eyes:     Conjunctiva/sclera: Conjunctivae normal.  Cardiovascular:     Rate and Rhythm: Normal rate and regular rhythm.     Heart sounds: Murmur heard.  Systolic murmur is present with a grade of 1/6.     Comments: 1/6 SEM RUSB Pulmonary:     Effort: Pulmonary effort is normal.      Breath sounds: No stridor. Examination of the right-lower field reveals wheezing and rhonchi. Examination of the left-lower field reveals rhonchi. Wheezing and rhonchi present. No rales.  Abdominal:     General: Abdomen is flat.     Palpations: There is no mass.     Tenderness: There is no abdominal tenderness. There is no guarding.  Musculoskeletal:     Cervical back: Neck supple.     Right lower leg: No edema.     Left lower leg: No edema.  Lymphadenopathy:     Cervical: No cervical adenopathy.  Skin:    General: Skin is warm and dry.  Neurological:     General: No focal deficit present.     Mental Status: He is alert.  Psychiatric:        Mood and Affect: Mood normal.        Behavior: Behavior normal.    Lab Results  Component Value Date   WBC 7.3 09/22/2020   HGB 13.0 09/22/2020   HCT 39.0 09/22/2020   PLT 358.0 09/22/2020   GLUCOSE 93 09/22/2020   CHOL 138 07/05/2020   TRIG 53 07/05/2020   HDL 55 07/05/2020   LDLCALC 71 07/05/2020   ALT 24 07/05/2020   AST 18 07/05/2020   NA 138 09/22/2020   K 4.8 09/22/2020   CL 105 09/22/2020   CREATININE 0.82 09/22/2020   BUN 12 09/22/2020   CO2 26 09/22/2020   TSH 3.910 07/05/2020   PSA 0.6 07/05/2020   HGBA1C 5.7 10/25/2017   Narrative & Impression  CLINICAL DATA:  Productive cough, congestion and shortness of breath for 1 week.   EXAM: CHEST - 2 VIEW   COMPARISON:  PA and lateral chest 02/02/2020 04/11/2018.   FINDINGS: The lungs are clear. Heart size is normal. Aortic atherosclerosis. No pneumothorax or pleural fluid. No acute or focal bony abnormality.   IMPRESSION: No acute disease.   Aortic Atherosclerosis (ICD10-I70.0).     Electronically Signed   By: Bradley [name] M.D.   On: 09/22/2020 11:36      Assessment & Plan:   Bradley [name] was seen today for cough.  Diagnoses and all orders for this visit:  Cough with sputum- His chest x-ray is negative for mass or infiltrate. -     DG Chest 2 View;  Future -     CBC with Differential/Platelet; Future -     SARS-COV-2 IgG; Future -     promethazine-dextromethorphan (PROMETHAZINE-DM) 6.25-15 MG/5ML syrup; Take 5 mLs by mouth 4 (four) times daily as needed for up to 7 days for cough. -     SARS-COV-2 IgG -     CBC with Differential/Platelet  LRTI (lower respiratory tract infection)- Will treat for lower respiratory tract infection with a broad-spectrum cephalosporin antibiotic. -  promethazine-dextromethorphan (PROMETHAZINE-DM) 6.25-15 MG/5ML syrup; Take 5 mLs by mouth 4 (four) times daily as needed for up to 7 days for cough. -     cefdinir (OMNICEF) 300 MG capsule; Take 1 capsule (300 mg total) by mouth 2 (two) times daily for 10 days.  Cough variant asthma- He is having a flare of asthma so will treat with a 6-day course of methylprednisolone.  I have also recommended that he start using a LAMA/LABA/ICS inhaler. -     promethazine-dextromethorphan (PROMETHAZINE-DM) 6.25-15 MG/5ML syrup; Take 5 mLs by mouth 4 (four) times daily as needed for up to 7 days for cough. -     methylPREDNISolone (MEDROL DOSEPAK) 4 MG TBPK tablet; TAKE AS DIRECTED -     Discontinue: budesonide-formoterol (SYMBICORT) 80-4.5 MCG/ACT inhaler; Inhale 2 puffs into the lungs 2 (two) times daily. -     Fluticasone-Umeclidin-Vilant (TRELEGY ELLIPTA) 100-62.5-25 MCG/INH AEPB; Inhale 1 puff into the lungs daily.  Essential hypertension- His blood pressure is well controlled. -     Basic metabolic panel; Future -     Basic metabolic panel  Vitamin I35 deficiency anemia due to intrinsic factor deficiency -     cyanocobalamin ((VITAMIN B-12)) injection 1,000 mcg  I have discontinued Arhum Peeples. [place]heit's budesonide-formoterol. I am also having him start on promethazine-dextromethorphan, methylPREDNISolone, cefdinir, and Trelegy Ellipta. Additionally, I am having him maintain his multivitamin, INS SYRINGE/NEEDLE 1CC/28G, cyanocobalamin, Vitamin D3, Buprenorphine HCl-Naloxone  HCl, zinc gluconate, Eszopiclone, folic acid, ferrous sulfate, irbesartan, Fusion, and thiamine. We administered cyanocobalamin.  Meds ordered this encounter  Medications   cyanocobalamin ((VITAMIN B-12)) injection 1,000 mcg   promethazine-dextromethorphan (PROMETHAZINE-DM) 6.25-15 MG/5ML syrup    Sig: Take 5 mLs by mouth 4 (four) times daily as needed for up to 7 days for cough.    Dispense:  118 mL    Refill:  0   methylPREDNISolone (MEDROL DOSEPAK) 4 MG TBPK tablet    Sig: TAKE AS DIRECTED    Dispense:  21 tablet    Refill:  0   cefdinir (OMNICEF) 300 MG capsule    Sig: Take 1 capsule (300 mg total) by mouth 2 (two) times daily for 10 days.    Dispense:  20 capsule    Refill:  0   DISCONTD: budesonide-formoterol (SYMBICORT) 80-4.5 MCG/ACT inhaler    Sig: Inhale 2 puffs into the lungs 2 (two) times daily.    Dispense:  3 each    Refill:  1   Fluticasone-Umeclidin-Vilant (TRELEGY ELLIPTA) 100-62.5-25 MCG/INH AEPB    Sig: Inhale 1 puff into the lungs daily.    Dispense:  120 each    Refill:  1      Follow-up: Return in about 3 weeks (around 10/13/2020).  [name], MD

## 2020-09-22 NOTE — Patient Instructions (Signed)

## 2020-10-18 ENCOUNTER — Telehealth: Payer: Self-pay | Admitting: Pharmacist

## 2020-10-18 NOTE — Progress Notes (Signed)
Chronic Care Management Pharmacy Assistant   Name: Bradley [name]  MRN: [id number] DOB: 10/12/1970   Reason for Encounter: Disease State   Conditions to be addressed/monitored: HTN   Recent office visits:  09/22/20 Dr. [name] (PCP)  Reason:Cough with sputum  Medication changes:I have discontinued Reynaldo Rossman. Shatswell's budesonide-formoterol. I am also having him start on promethazine-dextromethorphan, methylPREDNISolone, cefdinir, and Trelegy Ellipta.  Orders:BMP, CBC, SARS-COV-2igG and DG Chest 2 views  07/15/20 Dr. Ronnald Ramp (PCP) Reason:anemia, annual exam and hypertension. Medication Changes: I have discontinued Sanjit Mcmichael. Glanzer's eszopiclone and ACCRUFeR. I have also changed his zinc gluconate and irbesartan. Additionally, I am having him start on Eszopiclone, folic acid, and ferrous sulfate.   Recent consult visits:  07/05/20 Dr. [name] Cardiology Reason: [facility] visits:  None in previous 6 months  Medications: Outpatient Encounter Medications as of 10/18/2020  Medication Sig   Buprenorphine HCl-Naloxone HCl 8-2 MG FILM Place 1 strip under the tongue 2 (two) times daily.   Cholecalciferol (VITAMIN D3) 50 MCG (2000 UT) TABS TAKE 2 TABLETS BY MOUTH DAILY   cyanocobalamin (,VITAMIN B-12,) 1000 MCG/ML injection Inject 1 mL (1,000 mcg total) into the muscle every 30 (thirty) days.   Eszopiclone 3 MG TABS Take 1 tablet (3 mg total) by mouth at bedtime. Take immediately before bedtime   Fe Fum-Fe Poly-Vit C-Lactobac (FUSION) 65-65-25-30 MG CAPS Take 1 capsule by mouth in the morning and at bedtime.   ferrous sulfate 220 (44 Fe) MG/5ML solution Take 5 mLs (220 mg total) by mouth 3 (three) times daily with meals.   Fluticasone-Umeclidin-Vilant (TRELEGY ELLIPTA) 100-62.5-25 MCG/INH AEPB Inhale 1 puff into the lungs daily.   folic acid (FOLVITE) 1 MG tablet Take 1 tablet (1 mg total) by mouth daily.   INS SYRINGE/NEEDLE 1CC/28G 28G X 1/2" 1 ML  MISC Use to inject b12 monthly   irbesartan (AVAPRO) 150 MG tablet TAKE 1 TABLET(150 MG) BY MOUTH DAILY   Multiple Vitamin (MULTIVITAMIN) tablet Take 1 tablet by mouth daily.   thiamine (VITAMIN B-1) 50 MG tablet TAKE 1 TABLET BY MOUTH DAILY   zinc gluconate 50 MG tablet Take 1 tablet (50 mg total) by mouth daily.   No facility-administered encounter medications on file as of 10/18/2020.     Recent Office Vitals: BP Readings from Last 3 Encounters:  09/22/20 122/74  07/15/20 124/82  07/05/20 130/80   Pulse Readings from Last 3 Encounters:  09/22/20 62  07/15/20 70  07/05/20 60    Wt Readings from Last 3 Encounters:  09/22/20 205 lb (93 kg)  07/15/20 208 lb (94.3 kg)  07/05/20 209 lb (94.8 kg)     Kidney Function Lab Results  Component Value Date/Time   CREATININE 0.82 09/22/2020 12:02 PM   CREATININE 0.95 07/15/2020 04:14 PM   CREATININE 0.69 09/29/2014 02:36 PM   GFR 101.36 09/22/2020 12:02 PM   GFRNONAA >60 01/09/2020 10:45 AM   GFRNONAA >89 09/29/2014 02:36 PM   GFRAA >60 04/27/2017 05:24 AM   GFRAA >89 09/29/2014 02:36 PM    BMP Latest Ref Rng & Units 09/22/2020 07/15/2020 07/05/2020  Glucose 70 - 99 mg/dL 93 96 83  BUN 6 - 23 mg/dL 12 14 15   Creatinine 0.40 - 1.50 mg/dL 0.82 0.95 0.92  BUN/Creat Ratio 9 - 20 - - 16  Sodium 135 - 145 mEq/L 138 134(L) 141  Potassium 3.5 - 5.1 mEq/L 4.8 4.7 5.5(H)  Chloride 96 - 112 mEq/L 105 102  106  CO2 19 - 32 mEq/L 26 26 24   Calcium 8.4 - 10.5 mg/dL 8.9 8.5 8.8     Contacted patient on 10/18/20 to discuss hypertension disease state Contacted patient on 10/25/20 to discuss hypertension disease state Contacted patient on 10/27/20 to discuss hypertension disease state  3 attempts was made to contact patient for [place] adherence call.Left messages for patient to return call. Unable to reach patient  Current antihypertensive regimen:  Irbesartan 150 mg 1 tab daily   What recent interventions/DTPs have been made by any provider to  improve Blood Pressure control since last CPP Visit: None ID  Any recent hospitalizations or ED visits since last visit with CPP? No   Adherence Review: Is the patient currently on ACE/ARB medication? Yes Does the patient have >5 day gap between last estimated fill dates? Yes   Star Rating Drugs:  Medication:  Last Fill: Day Supply Irbesartan 150 mg 07/15/20  90   Care Gaps: Last annual wellness visit:07/15/20  No appointments scheduled within the next 30 days.    [place] Pharmacist Assistant [phone]   Time spent:29

## 2020-10-19 DIAGNOSIS — M5416 Radiculopathy, lumbar region: Secondary | ICD-10-CM | POA: Diagnosis not present

## 2020-10-27 ENCOUNTER — Encounter: Payer: Self-pay | Admitting: Internal Medicine

## 2020-10-28 ENCOUNTER — Other Ambulatory Visit: Payer: Self-pay

## 2020-10-28 ENCOUNTER — Encounter: Payer: Self-pay | Admitting: Internal Medicine

## 2020-10-28 ENCOUNTER — Ambulatory Visit (INDEPENDENT_AMBULATORY_CARE_PROVIDER_SITE_OTHER): Payer: Medicare Other

## 2020-10-28 ENCOUNTER — Ambulatory Visit (INDEPENDENT_AMBULATORY_CARE_PROVIDER_SITE_OTHER): Payer: Medicare Other | Admitting: Internal Medicine

## 2020-10-28 VITALS — BP 124/78 | HR 60 | Temp 98.3°F | Resp 16 | Ht 70.0 in | Wt 208.0 lb

## 2020-10-28 DIAGNOSIS — R059 Cough, unspecified: Secondary | ICD-10-CM | POA: Diagnosis not present

## 2020-10-28 DIAGNOSIS — R058 Other specified cough: Secondary | ICD-10-CM | POA: Insufficient documentation

## 2020-10-28 DIAGNOSIS — I10 Essential (primary) hypertension: Secondary | ICD-10-CM

## 2020-10-28 DIAGNOSIS — J22 Unspecified acute lower respiratory infection: Secondary | ICD-10-CM | POA: Diagnosis not present

## 2020-10-28 MED ORDER — CEFDINIR 300 MG PO CAPS: 300.0000 mg | ORAL_CAPSULE | Freq: Two times a day (BID) | ORAL | 0 refills | Status: AC

## 2020-10-28 NOTE — Progress Notes (Signed)
productive  Subjective:  Patient ID: Bradley [name], male    DOB: 1971-02-27  Age: 50 y.o. MRN: [id number]  CC: Cough  This visit occurred during the SARS-CoV-2 public health emergency.  Safety protocols were in place, including screening questions prior to the visit, additional usage of staff PPE, and extensive cleaning of exam room while observing appropriate contact time as indicated for disinfecting solutions.    HPI Bradley [name] presents for f/up -  He complains of a 3-day history of cough productive of purulent phlegm with night sweats and chills.  He denies fever, chest pain, hemoptysis, wheezing, shortness of breath, or diaphoresis.  Outpatient Medications Prior to Visit  Medication Sig Dispense Refill   Buprenorphine HCl-Naloxone HCl 8-2 MG FILM Place 1 strip under the tongue 2 (two) times daily.     Cholecalciferol (VITAMIN D3) 50 MCG (2000 UT) TABS TAKE 2 TABLETS BY MOUTH DAILY 180 tablet 1   cyanocobalamin (,VITAMIN B-12,) 1000 MCG/ML injection Inject 1 mL (1,000 mcg total) into the muscle every 30 (thirty) days. 3 mL 3   Eszopiclone 3 MG TABS Take 1 tablet (3 mg total) by mouth at bedtime. Take immediately before bedtime 90 tablet 1   Fe Fum-Fe Poly-Vit C-Lactobac (FUSION) 65-65-25-30 MG CAPS Take 1 capsule by mouth in the morning and at bedtime. 180 capsule 1   ferrous sulfate 220 (44 Fe) MG/5ML solution Take 5 mLs (220 mg total) by mouth 3 (three) times daily with meals. 450 mL 1   Fluticasone-Umeclidin-Vilant (TRELEGY ELLIPTA) 100-62.5-25 MCG/INH AEPB Inhale 1 puff into the lungs daily. 774 each 1   folic acid (FOLVITE) 1 MG tablet Take 1 tablet (1 mg total) by mouth daily. 90 tablet 1   INS SYRINGE/NEEDLE 1CC/28G 28G X 1/2" 1 ML MISC Use to inject b12 monthly 10 each 1   irbesartan (AVAPRO) 150 MG tablet TAKE 1 TABLET(150 MG) BY MOUTH DAILY 90 tablet 0   Multiple Vitamin (MULTIVITAMIN) tablet Take 1 tablet by mouth daily.     thiamine (VITAMIN B-1) 50 MG tablet TAKE 1  TABLET BY MOUTH DAILY 90 tablet 1   zinc gluconate 50 MG tablet Take 1 tablet (50 mg total) by mouth daily. 90 tablet 0   No facility-administered medications prior to visit.    ROS Review of Systems  Constitutional:  Positive for chills. Negative for diaphoresis, fatigue and fever.  HENT: Negative.  Negative for sore throat and trouble swallowing.   Eyes: Negative.   Respiratory:  Positive for cough. Negative for chest tightness, shortness of breath and wheezing.   Cardiovascular:  Negative for chest pain, palpitations and leg swelling.  Gastrointestinal:  Negative for abdominal pain, constipation, diarrhea, nausea and vomiting.  Endocrine: Negative.   Genitourinary: Negative.  Negative for difficulty urinating and dysuria.  Musculoskeletal: Negative.   Skin: Negative.  Negative for rash.  Neurological:  Negative for dizziness, weakness and light-headedness.  Hematological:  Negative for adenopathy. Does not bruise/bleed easily.  Psychiatric/Behavioral: Negative.     Objective:  BP 124/78 (BP Location: Left Arm, Patient Position: Sitting, Cuff Size: Large)   Pulse 60   Temp 98.3 F (36.8 C) (Oral)   Resp 16   Ht 5' 10"  (1.778 m)   Wt 208 lb (94.3 kg)   SpO2 95%   BMI 29.84 kg/m   BP Readings from Last 3 Encounters:  10/28/20 124/78  09/22/20 122/74  07/15/20 124/82    Wt Readings from Last 3 Encounters:  10/28/20 208 lb (94.3 kg)  09/22/20 205 lb (93 kg)  07/15/20 208 lb (94.3 kg)    Physical Exam Vitals reviewed.  Constitutional:      General: He is not in acute distress.    Appearance: He is not toxic-appearing or diaphoretic.  HENT:     Nose: Nose normal.     Mouth/Throat:     Mouth: Mucous membranes are moist.  Eyes:     Conjunctiva/sclera: Conjunctivae normal.  Cardiovascular:     Rate and Rhythm: Normal rate and regular rhythm.     Heart sounds: Murmur heard.  Systolic murmur is present with a grade of 1/6.    No gallop.  Pulmonary:     Effort:  Pulmonary effort is normal.     Breath sounds: No stridor. No decreased breath sounds, wheezing, rhonchi or rales.  Chest:     Chest wall: No tenderness.  Abdominal:     General: Abdomen is flat.     Palpations: There is no mass.     Tenderness: There is no abdominal tenderness. There is no guarding.     Hernia: No hernia is present.  Musculoskeletal:        General: Normal range of motion.     Cervical back: Neck supple.     Right lower leg: No edema.     Left lower leg: No edema.  Lymphadenopathy:     Cervical: No cervical adenopathy.  Skin:    General: Skin is warm and dry.  Neurological:     General: No focal deficit present.     Mental Status: He is alert.  Psychiatric:        Mood and Affect: Mood normal.        Behavior: Behavior normal.    Lab Results  Component Value Date   WBC 7.3 09/22/2020   HGB 13.0 09/22/2020   HCT 39.0 09/22/2020   PLT 358.0 09/22/2020   GLUCOSE 93 09/22/2020   CHOL 138 07/05/2020   TRIG 53 07/05/2020   HDL 55 07/05/2020   LDLCALC 71 07/05/2020   ALT 24 07/05/2020   AST 18 07/05/2020   NA 138 09/22/2020   K 4.8 09/22/2020   CL 105 09/22/2020   CREATININE 0.82 09/22/2020   BUN 12 09/22/2020   CO2 26 09/22/2020   TSH 3.910 07/05/2020   PSA 0.6 07/05/2020   HGBA1C 5.7 10/25/2017    DG Chest 2 View  Result Date: 10/28/2020 CLINICAL DATA:  Cough with purulent phlegm EXAM: CHEST - 2 VIEW COMPARISON:  Chest radiograph 09/22/2020 FINDINGS: The cardiomediastinal silhouette is within normal limits. There is no focal consolidation or pulmonary edema. There is no pleural effusion or pneumothorax. There is no acute osseous abnormality. Lumbar spine fusion hardware is partially imaged. IMPRESSION: Stable chest with no radiographic evidence of acute cardiopulmonary process. Electronically Signed   By: Valetta Mole M.D.   On: 10/28/2020 15:05     Assessment & Plan:   Bradley [name] was seen today for cough.  Diagnoses and all orders for this  visit:  Cough productive of purulent sputum- His chest x-ray is negative for mass or infiltrate. -     DG Chest 2 View; Future  LRTI (lower respiratory tract infection)- Will treat with a broad-spectrum cephalosporin antibiotic. -     cefdinir (OMNICEF) 300 MG capsule; Take 1 capsule (300 mg total) by mouth 2 (two) times daily for 10 days.  Essential hypertension- His blood pressure is adequately well controlled.  I am having Hendricks Schwandt. Ebony Hail start on  cefdinir. I am also having him maintain his multivitamin, INS SYRINGE/NEEDLE 1CC/28G, cyanocobalamin, Vitamin D3, Buprenorphine HCl-Naloxone HCl, zinc gluconate, Eszopiclone, folic acid, ferrous sulfate, irbesartan, Fusion, thiamine, and Trelegy Ellipta.  Meds ordered this encounter  Medications   cefdinir (OMNICEF) 300 MG capsule    Sig: Take 1 capsule (300 mg total) by mouth 2 (two) times daily for 10 days.    Dispense:  20 capsule    Refill:  0      Follow-up: Return if symptoms worsen or fail to improve.  [name], MD

## 2020-10-28 NOTE — Patient Instructions (Signed)

## 2020-11-04 ENCOUNTER — Encounter: Payer: Self-pay | Admitting: Internal Medicine

## 2020-11-04 ENCOUNTER — Other Ambulatory Visit: Payer: Self-pay | Admitting: Internal Medicine

## 2020-11-04 DIAGNOSIS — L247 Irritant contact dermatitis due to plants, except food: Secondary | ICD-10-CM | POA: Insufficient documentation

## 2020-11-04 MED ORDER — FLUOCINONIDE EMULSIFIED BASE 0.05 % EX CREA: 1.0000 "application " | TOPICAL_CREAM | Freq: Two times a day (BID) | CUTANEOUS | 0 refills | Status: DC

## 2020-11-04 MED ORDER — PREDNISONE 10 MG PO TABS: 30.0000 mg | ORAL_TABLET | Freq: Two times a day (BID) | ORAL | 0 refills | Status: AC

## 2020-11-04 MED ORDER — HYDROXYZINE PAMOATE 25 MG PO CAPS: 25.0000 mg | ORAL_CAPSULE | Freq: Three times a day (TID) | ORAL | 0 refills | Status: DC | PRN

## 2020-11-04 MED ORDER — PREDNISONE 5 MG PO TABS: 15.0000 mg | ORAL_TABLET | Freq: Two times a day (BID) | ORAL | 0 refills | Status: AC

## 2020-11-04 MED ORDER — PREDNISONE 2.5 MG PO TABS: 7.5000 mg | ORAL_TABLET | Freq: Two times a day (BID) | ORAL | 0 refills | Status: AC

## 2020-11-18 DIAGNOSIS — Z5181 Encounter for therapeutic drug level monitoring: Secondary | ICD-10-CM | POA: Diagnosis not present

## 2020-11-18 DIAGNOSIS — Z79899 Other long term (current) drug therapy: Secondary | ICD-10-CM | POA: Diagnosis not present

## 2020-11-18 DIAGNOSIS — G894 Chronic pain syndrome: Secondary | ICD-10-CM | POA: Diagnosis not present

## 2020-12-10 ENCOUNTER — Other Ambulatory Visit: Payer: Self-pay | Admitting: Internal Medicine

## 2020-12-10 DIAGNOSIS — E559 Vitamin D deficiency, unspecified: Secondary | ICD-10-CM

## 2020-12-10 DIAGNOSIS — Z9884 Bariatric surgery status: Secondary | ICD-10-CM

## 2020-12-14 ENCOUNTER — Telehealth: Payer: Self-pay

## 2020-12-14 NOTE — Progress Notes (Signed)
Chronic Care Management Pharmacy Assistant   Name: Bradley [name]  MRN: [id number] DOB: [birth year]   Reason for Encounter: Disease State   Conditions to be addressed/monitored: HTN   Recent office visits:  10/28/20 Bradley Lima, MD-PCP (Cough) med changes:start on cefdinir 09/22/20 Bradley Lima, MD-PCP (Cough) med changes: discontinued budesonide-formoterol, start on promethazine-dextromethorphan, methylPREDNISolone, cefdinir, and Trelegy Ellipta.   Recent consult visits:  None ID  Hospital visits:  None since last coordination call  Medications: Outpatient Encounter Medications as of 12/14/2020  Medication Sig   Buprenorphine HCl-Naloxone HCl 8-2 MG FILM Place 1 strip under the tongue 2 (two) times daily.   Cholecalciferol (VITAMIN D3) 50 MCG (2000 UT) TABS TAKE 2 TABLETS BY MOUTH DAILY   cyanocobalamin (,VITAMIN B-12,) 1000 MCG/ML injection Inject 1 mL (1,000 mcg total) into the muscle every 30 (thirty) days.   Eszopiclone 3 MG TABS Take 1 tablet (3 mg total) by mouth at bedtime. Take immediately before bedtime   Fe Fum-Fe Poly-Vit C-Lactobac (FUSION) 65-65-25-30 MG CAPS Take 1 capsule by mouth in the morning and at bedtime.   ferrous sulfate 220 (44 Fe) MG/5ML solution Take 5 mLs (220 mg total) by mouth 3 (three) times daily with meals.   fluocinonide-emollient (LIDEX-E) 0.05 % cream Apply 1 application topically 2 (two) times daily.   Fluticasone-Umeclidin-Vilant (TRELEGY ELLIPTA) 100-62.5-25 MCG/INH AEPB Inhale 1 puff into the lungs daily.   folic acid (FOLVITE) 1 MG tablet Take 1 tablet (1 mg total) by mouth daily.   hydrOXYzine (VISTARIL) 25 MG capsule Take 1 capsule (25 mg total) by mouth every 8 (eight) hours as needed.   INS SYRINGE/NEEDLE 1CC/28G 28G X 1/2" 1 ML MISC Use to inject b12 monthly   irbesartan (AVAPRO) 150 MG tablet TAKE 1 TABLET(150 MG) BY MOUTH DAILY   Multiple Vitamin (MULTIVITAMIN) tablet Take 1 tablet by mouth daily.   thiamine (VITAMIN B-1)  50 MG tablet TAKE 1 TABLET BY MOUTH DAILY   zinc gluconate 50 MG tablet Take 1 tablet (50 mg total) by mouth daily.   No facility-administered encounter medications on file as of 12/14/2020.   Reviewed chart prior to disease state call. Spoke with patient regarding BP  Recent Office Vitals: BP Readings from Last 3 Encounters:  10/28/20 124/78  09/22/20 122/74  07/15/20 124/82   Pulse Readings from Last 3 Encounters:  10/28/20 60  09/22/20 62  07/15/20 70    Wt Readings from Last 3 Encounters:  10/28/20 208 lb (94.3 kg)  09/22/20 205 lb (93 kg)  07/15/20 208 lb (94.3 kg)     Kidney Function Lab Results  Component Value Date/Time   CREATININE 0.82 09/22/2020 12:02 PM   CREATININE 0.95 07/15/2020 04:14 PM   CREATININE 0.69 09/29/2014 02:36 PM   GFR 101.36 09/22/2020 12:02 PM   GFRNONAA >60 01/09/2020 10:45 AM   GFRNONAA >89 09/29/2014 02:36 PM   GFRAA >60 04/27/2017 05:24 AM   GFRAA >89 09/29/2014 02:36 PM    BMP Latest Ref Rng & Units 09/22/2020 07/15/2020 07/05/2020  Glucose 70 - 99 mg/dL 93 96 83  BUN 6 - 23 mg/dL 12 14 15   Creatinine 0.40 - 1.50 mg/dL 0.82 0.95 0.92  BUN/Creat Ratio 9 - 20 - - 16  Sodium 135 - 145 mEq/L 138 134(L) 141  Potassium 3.5 - 5.1 mEq/L 4.8 4.7 5.5(H)  Chloride 96 - 112 mEq/L 105 102 106  CO2 19 - 32 mEq/L 26 26 24   Calcium 8.4 - 10.5 mg/dL  8.9 8.5 8.8   3 attempts were made to contact patient for hypertension adherence call. Unable to reach patient.  Current antihypertensive regimen:  Irbesartan 150 mg What recent interventions/DTPs have been made by any provider to improve Blood Pressure control since last CPP Visit: None noted  Any recent hospitalizations or ED visits since last visit with CPP? No   Adherence Review: Is the patient currently on ACE/ARB medication? Yes Does the patient have >5 day gap between last estimated fill dates? No  Care Gaps: Colonoscopy-05/03/18 Diabetic Foot Exam-NA Mammogram-NA Ophthalmology-NA Dexa  Scan - NA Annual Well Visit - NA Micro albumin-NA Hemoglobin A1c- NA  Star Rating Drugs: Irbesartan 150 mg-last fill, 11/02/20 90 ds  Bradley [name] Clinical Pharmacist Assistant [phone]

## 2020-12-29 DIAGNOSIS — M5416 Radiculopathy, lumbar region: Secondary | ICD-10-CM | POA: Diagnosis not present

## 2020-12-31 ENCOUNTER — Other Ambulatory Visit: Payer: Self-pay | Admitting: Neurosurgery

## 2020-12-31 DIAGNOSIS — M5416 Radiculopathy, lumbar region: Secondary | ICD-10-CM

## 2021-01-03 ENCOUNTER — Other Ambulatory Visit: Payer: Self-pay | Admitting: Internal Medicine

## 2021-01-03 DIAGNOSIS — G4701 Insomnia due to medical condition: Secondary | ICD-10-CM

## 2021-01-06 ENCOUNTER — Ambulatory Visit
Admission: RE | Admit: 2021-01-06 | Discharge: 2021-01-06 | Disposition: A | Discharge status: Home / Self Care | Payer: Self-pay | Source: Ambulatory Visit | Attending: Neurosurgery | Admitting: Neurosurgery

## 2021-01-06 ENCOUNTER — Ambulatory Visit
Admission: RE | Admit: 2021-01-06 | Discharge: 2021-01-06 | Disposition: A | Discharge status: Home / Self Care | Payer: Medicare Other | Source: Ambulatory Visit | Attending: Neurosurgery | Admitting: Neurosurgery

## 2021-01-06 ENCOUNTER — Other Ambulatory Visit: Payer: Self-pay

## 2021-01-06 ENCOUNTER — Other Ambulatory Visit: Payer: Self-pay | Admitting: Neurosurgery

## 2021-01-06 DIAGNOSIS — M5116 Intervertebral disc disorders with radiculopathy, lumbar region: Secondary | ICD-10-CM | POA: Diagnosis not present

## 2021-01-06 DIAGNOSIS — M5416 Radiculopathy, lumbar region: Secondary | ICD-10-CM | POA: Diagnosis not present

## 2021-01-06 DIAGNOSIS — M4326 Fusion of spine, lumbar region: Secondary | ICD-10-CM | POA: Diagnosis not present

## 2021-01-06 MED ORDER — ONDANSETRON HCL 4 MG/2ML IJ SOLN: 4.0000 mg | Freq: Once | INTRAMUSCULAR | Status: DC | PRN

## 2021-01-06 MED ORDER — DIAZEPAM 5 MG PO TABS
10.0000 mg | ORAL_TABLET | Freq: Once | ORAL | Status: AC
  Administered 2021-01-06: 10 mg via ORAL

## 2021-01-06 MED ORDER — MEPERIDINE HCL 50 MG/ML IJ SOLN: 50.0000 mg | Freq: Once | INTRAMUSCULAR | Status: DC | PRN

## 2021-01-06 MED ORDER — IOPAMIDOL (ISOVUE-M 200) INJECTION 41%
20.0000 mL | Freq: Once | INTRAMUSCULAR | Status: AC
  Administered 2021-01-06: 20 mL via INTRATHECAL

## 2021-01-06 NOTE — Discharge Instructions (Signed)

## 2021-01-11 ENCOUNTER — Other Ambulatory Visit: Payer: Self-pay | Admitting: Internal Medicine

## 2021-01-11 DIAGNOSIS — D528 Other folate deficiency anemias: Secondary | ICD-10-CM

## 2021-01-12 DIAGNOSIS — I10 Essential (primary) hypertension: Secondary | ICD-10-CM | POA: Diagnosis not present

## 2021-01-12 DIAGNOSIS — M5416 Radiculopathy, lumbar region: Secondary | ICD-10-CM | POA: Diagnosis not present

## 2021-01-19 DIAGNOSIS — M5416 Radiculopathy, lumbar region: Secondary | ICD-10-CM | POA: Diagnosis not present

## 2021-01-20 ENCOUNTER — Telehealth: Payer: Medicare Other

## 2021-01-20 DIAGNOSIS — M5459 Other low back pain: Secondary | ICD-10-CM | POA: Diagnosis not present

## 2021-01-20 DIAGNOSIS — G894 Chronic pain syndrome: Secondary | ICD-10-CM | POA: Diagnosis not present

## 2021-01-20 DIAGNOSIS — R109 Unspecified abdominal pain: Secondary | ICD-10-CM | POA: Diagnosis not present

## 2021-01-20 DIAGNOSIS — M961 Postlaminectomy syndrome, not elsewhere classified: Secondary | ICD-10-CM | POA: Diagnosis not present

## 2021-01-20 DIAGNOSIS — Z79891 Long term (current) use of opiate analgesic: Secondary | ICD-10-CM | POA: Diagnosis not present

## 2021-01-20 NOTE — Progress Notes (Deleted)
Chronic Care Management Pharmacy Note  01/20/2021 Name:  Bradley [name] MRN:  [id number] DOB:  1970/09/26  Summary: ***  Recommendations/Changes made from today's visit: ***  Plan: ***  Subjective: Bradley [name] is an 50 y.o. year old male who is a primary patient of [name], MD.  The CCM team was consulted for assistance with disease management and care coordination needs.    Engaged with patient by telephone for follow up visit in response to provider referral for pharmacy case management and/or care coordination services.   Consent to Services:  The patient was given the following information about Chronic Care Management services today, agreed to services, and gave verbal consent: 1. CCM service includes personalized support from designated clinical staff supervised by the primary care provider, including individualized plan of care and coordination with other care providers 2. 24/7 contact phone numbers for assistance for urgent and routine care needs. 3. Service will only be billed when office clinical staff spend 20 minutes or more in a month to coordinate care. 4. Only one practitioner may furnish and bill the service in a calendar month. 5.The patient may stop CCM services at any time (effective at the end of the month) by phone call to the office staff. 6. The patient will be responsible for cost sharing (co-pay) of up to 20% of the service fee (after annual deductible is met). Patient agreed to services and consent obtained.  Patient Care Team: [name], MD as PCP - General (Internal Medicine) Charlton Haws, [facility] as Pharmacist (Pharmacist)  Recent office visits: 10/28/2020 - Dr. Ronnald Ramp - 3 day history of cough, night sweats, and chills - chest x ray negative - started on cefdinir  09/22/2020 - Dr. Ronnald Ramp - 1 week history of cough, low-grade fever, chills, night sweats - asthma flare - started trelegy in place of symbicort, started medrol dosepak,  promethazine-DM and cefdinir   Recent consult visits: 09/08/2020 - Dr. Brien Few - Physical Medicine and Rehabilitation - notes not available  08/26/2020 - Dr. Nevada Crane - Dermatology - notes not available  08/25/2020 - Dr. Annette Stable - Neurosurgery - notes not available   Hospital visits: None in previous 6 months  Objective:  Lab Results  Component Value Date   CREATININE 0.82 09/22/2020   BUN 12 09/22/2020   GFR 101.36 09/22/2020   GFRNONAA >60 01/09/2020   GFRAA >60 04/27/2017   NA 138 09/22/2020   K 4.8 09/22/2020   CALCIUM 8.9 09/22/2020   CO2 26 09/22/2020   GLUCOSE 93 09/22/2020    Lab Results  Component Value Date/Time   HGBA1C 5.7 10/25/2017 03:47 PM   HGBA1C 4.9 10/05/2009 09:51 AM   GFR 101.36 09/22/2020 12:02 PM   GFR 92.48 07/15/2020 04:14 PM    Last diabetic Eye exam:  No results found for: HMDIABEYEEXA  Last diabetic Foot exam:  No results found for: HMDIABFOOTEX   Lab Results  Component Value Date   CHOL 138 07/05/2020   HDL 55 07/05/2020   LDLCALC 71 07/05/2020   TRIG 53 07/05/2020   CHOLHDL 2.5 07/05/2020    Hepatic Function Latest Ref Rng & Units 07/05/2020 02/02/2020 04/28/2019  Total Protein 6.0 - 8.5 g/dL 6.2 6.9 6.5  Albumin 3.8 - 4.9 g/dL 3.9 3.5 4.0  AST 0 - 40 IU/L 18 13 21   ALT 0 - 44 IU/L 24 15 18   Alk Phosphatase 44 - 121 IU/L 104 71 83  Total Bilirubin 0.0 - 1.2 mg/dL 0.4 0.3  0.4  Bilirubin, Direct 0.0 - 0.3 mg/dL - - 0.1    Lab Results  Component Value Date/Time   TSH 3.910 07/05/2020 01:58 PM   TSH 3.43 04/28/2019 11:38 AM    CBC Latest Ref Rng & Units 09/22/2020 07/15/2020 07/05/2020  WBC 4.0 - 10.5 K/uL 7.3 6.4 6.0  Hemoglobin 13.0 - 17.0 g/dL 13.0 12.6(L) 12.6(L)  Hematocrit 39.0 - 52.0 % 39.0 38.3(L) 38.8  Platelets 150.0 - 400.0 K/uL 358.0 205.0 257    Lab Results  Component Value Date/Time   VD25OH 32.12 07/15/2020 04:14 PM   VD25OH 20.7 (L) 07/05/2020 01:58 PM   VD25OH 50.18 02/24/2020 02:13 PM    Clinical ASCVD:  {YES/NO:21197} The 10-year ASCVD risk score (Arnett DK, et al., 2019) is: 3.3%   Values used to calculate the score:     Age: 26 years     Sex: Male     Is Non-Hispanic African American: No     Diabetic: No     Tobacco smoker: No     Systolic Blood Pressure: 017 mmHg     Is BP treated: Yes     HDL Cholesterol: 55 mg/dL     Total Cholesterol: 138 mg/dL    Depression screen [facility] 2/9 02/02/2020 10/25/2017 09/05/2016  Decreased Interest 0 0 1  Down, Depressed, Hopeless 0 0 0  PHQ - 2 Score 0 0 1  Altered sleeping - 3 0  Tired, decreased energy - 2 1  Change in appetite - 0 0  Feeling bad or failure about yourself  - 0 0  Trouble concentrating - 0 0  Moving slowly or fidgety/restless - 1 0  Suicidal thoughts - 0 0  PHQ-9 Score - 6 2  Difficult doing work/chores - Not difficult at all -  Some recent data might be hidden     ***Other: (CHADS2VASc if Afib, MMRC or CAT for COPD, ACT, DEXA)  Social History   Tobacco Use  Smoking Status Never  Smokeless Tobacco Never   BP Readings from Last 3 Encounters:  01/06/21 (!) 141/91  10/28/20 124/78  09/22/20 122/74   Pulse Readings from Last 3 Encounters:  01/06/21 63  10/28/20 60  09/22/20 62   Wt Readings from Last 3 Encounters:  10/28/20 208 lb (94.3 kg)  09/22/20 205 lb (93 kg)  07/15/20 208 lb (94.3 kg)   BMI Readings from Last 3 Encounters:  10/28/20 29.84 kg/m  09/22/20 29.41 kg/m  07/15/20 29.84 kg/m    Assessment/Interventions: Review of patient past medical history, allergies, medications, health status, including review of consultants reports, laboratory and other test data, was performed as part of comprehensive evaluation and provision of chronic care management services.   SDOH:  (Social Determinants of Health) assessments and interventions performed: {yes/no:20286}  SDOH Screenings   Alcohol Screen: Not on file  Depression (PHQ2-9): Low Risk    PHQ-2 Score: 0  Financial Resource Strain: Not on file   Food Insecurity: Not on file  Housing: Not on file  Physical Activity: Not on file  Social Connections: Not on file  Stress: Not on file  Tobacco Use: Low Risk    Smoking Tobacco Use: Never   Smokeless Tobacco Use: Never   Passive Exposure: Not on file  Transportation Needs: Not on file    [place]  Allergies  Allergen Reactions   Codeine Hives and Itching    Medications Reviewed Today     Reviewed by [name], MD (Physician) on 10/28/20 at  1431  Med List Status: <None>   Medication Order Taking? Sig Documenting Provider Last Dose Status Informant  Buprenorphine HCl-Naloxone HCl 8-2 MG FILM [id number] Yes Place 1 strip under the tongue 2 (two) times daily. [provider] Taking Active   Cholecalciferol (VITAMIN D3) 50 MCG (2000 UT) TABS [id number] Yes TAKE 2 TABLETS BY MOUTH DAILY [name], MD Taking Active   cyanocobalamin (,VITAMIN B-12,) 1000 MCG/ML injection [id number] Yes Inject 1 mL (1,000 mcg total) into the muscle every 30 (thirty) days. [name], MD Taking Active Self  Eszopiclone 3 MG TABS [id number] Yes Take 1 tablet (3 mg total) by mouth at bedtime. Take immediately before bedtime [name], MD Taking Active   Fe Fum-Fe Poly-Vit C-Lactobac (FUSION) 65-65-25-30 MG CAPS [id number] Yes Take 1 capsule by mouth in the morning and at bedtime. [name], MD Taking Active   ferrous sulfate 220 (44 Fe) MG/5ML solution [id number] Yes Take 5 mLs (220 mg total) by mouth 3 (three) times daily with meals. [name], MD Taking Active   Fluticasone-Umeclidin-[facility] ELLIPTA) 100-62.5-25 MCG/INH AEPB [id number] Yes Inhale 1 puff into the lungs daily. [name], MD Taking Active   folic acid (FOLVITE) 1 MG tablet [id number] Yes Take 1 tablet (1 mg total) by mouth daily. [name], MD Taking Active   INS SYRINGE/NEEDLE 1CC/28G 28G X 1/2" 1 ML MISC [id number] Yes Use to inject b12 monthly [name], MD Taking Active Self   irbesartan (AVAPRO) 150 MG tablet [id number] Yes TAKE 1 TABLET(150 MG) BY MOUTH DAILY [name], MD Taking Active   Multiple Vitamin (MULTIVITAMIN) tablet [id number] Yes Take 1 tablet by mouth daily. [provider] Taking Active Self  thiamine (VITAMIN B-1) 50 MG tablet [id number] Yes TAKE 1 TABLET BY MOUTH DAILY [name], MD Taking Active   zinc gluconate 50 MG tablet [id number] Yes Take 1 tablet (50 mg total) by mouth daily. [name], MD Taking Active             Patient Active Problem List   Diagnosis Date Noted   Contact dermatitis and eczema due to plant 11/04/2020   Cough productive of purulent sputum 10/28/2020   Cough with sputum 09/22/2020   LRTI (lower respiratory tract infection) 09/22/2020   Benign prostatic hyperplasia without lower urinary tract symptoms 04/22/2019   Gastroesophageal reflux disease without esophagitis 04/19/2018   Family history of colon cancer in mother 04/19/2018   Thiamine deficiency 04/14/2018   Other cirrhosis of liver ([place]) 03/13/2018   NASH (nonalcoholic steatohepatitis) 03/13/2018   Low libido 01/28/2018   Hyperlipidemia LDL goal <130 10/25/2017   Insomnia secondary to chronic pain 06/19/2017   Osteoarthritis of left knee 04/26/2017   Degenerative spondylolisthesis 02/24/2016   Routine general medical examination at a health care facility 06/08/2015   Cough variant asthma 02/06/2015   Zinc deficiency 04/12/2014   B12 deficiency anemia 04/08/2014   Vitamin D deficiency 07/26/2012   History of gastric bypass 07/25/2012   Anemia, iron deficiency 07/25/2012   NARCOTIC ABUSE 03/25/2009   EXOGENOUS OBESITY 08/22/2007   Osteoarthritis of right knee 03/12/2007   Essential hypertension 10/08/2006   BACK PAIN, LUMBAR 10/08/2006    Immunization History  Administered Date(s) Administered   Hep A / Hep B 09/26/2016, 10/27/2016, 01/29/2017   Influenza,inj,Quad PF,6+ Mos 12/07/2017, 04/22/2019   Moderna Sars-Covid-2  Vaccination 10/26/2019, 11/29/2019   Pneumococcal Conjugate-13 07/25/2012   Tdap 08/28/2007, 09/05/2016    Conditions  to be addressed/monitored:  {USCCMDZASSESSMENTOPTIONS:23563}  There are no care plans that you recently modified to display for this patient.     Medication Assistance: {MEDASSISTANCEINFO:25044}  Compliance/Adherence/Medication fill history: Care Gaps: ***  Patient's preferred pharmacy is:  [facility] #[id number] - [place], [address] AT [address] [address] Phone: [phone] Fax: [facility] #[id number] - [place], [place] - [address] AT [address] [place] [address] Phone: [phone] Fax: [phone]  [place], [address] [place] [address] Phone: [phone] Fax: [phone]   Uses pill box? {Yes or If no, why not?:20788} Pt endorses ***% compliance  Care Plan and Follow Up Patient Decision:  {FOLLOWUP:24991}  Plan: {CM FOLLOW UP PLAN:25073}  ***  CARE PLAN ENTRY  Current Barriers:  Chronic Disease Management support, education, and care coordination needs related to Hypertension and Vitamin Deficiencies   Hypertension(Goal BP <130/80) BP Readings from Last 3 Encounters:  01/06/21 (!) 141/91  10/28/20 124/78  09/22/20 122/74   Pharmacist Clinical Goal(s): Over the next 180 days, patient will work with PharmD and providers to maintain BP goal <130/80 Current regimen:  Indapamide 1.25 mg daily *** Irbesartan 150 mg daily Interventions: Discussed benefits of monitoring BP at home Patient self care activities - Over the next 180 days, patient will: Check BP 1-2 times weekly, document, and provide at future appointments Ensure daily salt intake < 2300 mg/day  Vitamin Deficiencies  Ref. Range 04/28/2019 11:38  Vitamin B1 (Thiamine) Latest Ref  Range: 8 - 30 nmol/L 18  VITD Latest Ref Range: 30.00 - 100.00 ng/mL 24.74 (L)  Vitamin B12 Latest Ref Range: 211 - 911 pg/mL 452  Zinc Latest Ref Range: 60 - 130 mcg/dL 41 (L)  Pharmacist Clinical Goal(s) Over the next 180 days, patient will work with PharmD and providers to optimize vitamin regimen Current regimen:  Vitamin B12 1000 mcg/mL injection Vitamin D 2000 IU - 2 tablet daily Folic Acid 80m daily  Thiamine 582mdaily  Ferrous Sulfate 22092mmL - 220m30mtimes daily  Zinc 50 mg daily Multivitamin Interventions: Discussed optimal vitamin ranges  Patient self care activities - Over the next 180 days, patient will: Continue supplements as directed  Medication management Pharmacist Clinical Goal(s): Over the next 180 days, patient will work with PharmD and providers to maintain optimal medication adherence Current pharmacy: [facility] Interventions Comprehensive medication review performed. Continue current medication management strategy Patient self care activities - Over the next 180 days, patient will: Focus on medication adherence by fill date Take medications as prescribed Report any questions or concerns to PharmD and/or provider(s)  Chart prep = 13 minutes

## 2021-01-24 ENCOUNTER — Telehealth: Payer: Medicare Other

## 2021-01-24 NOTE — Progress Notes (Deleted)
Chronic Care Management Pharmacy Note  01/24/2021 Name:  Bradley [name] MRN:  [id number] DOB:  1970-12-17  Summary: ***  Recommendations/Changes made from today's visit: ***  Plan: ***  Subjective: Bradley [name] is an 50 y.o. year old male who is a primary patient of Bradley Lima, MD.  The CCM team was consulted for assistance with disease management and care coordination needs.    Engaged with patient by telephone for follow up visit in response to provider referral for pharmacy case management and/or care coordination services.   Consent to Services:  The patient was given the following information about Chronic Care Management services today, agreed to services, and gave verbal consent: 1. CCM service includes personalized support from designated clinical staff supervised by the primary care provider, including individualized plan of care and coordination with other care providers 2. 24/7 contact phone numbers for assistance for urgent and routine care needs. 3. Service will only be billed when office clinical staff spend 20 minutes or more in a month to coordinate care. 4. Only one practitioner may furnish and bill the service in a calendar month. 5.The patient may stop CCM services at any time (effective at the end of the month) by phone call to the office staff. 6. The patient will be responsible for cost sharing (co-pay) of up to 20% of the service fee (after annual deductible is met). Patient agreed to services and consent obtained.  Patient Care Team: Bradley Lima, MD as PCP - General (Internal Medicine) Charlton Haws, [facility] as Pharmacist (Pharmacist)  Recent office visits: 10/28/2020 - Dr. Ronnald Ramp - 3 day history of cough, night sweats, and chills - chest x ray negative - started on cefdinir  09/22/2020 - Dr. Ronnald Ramp - 1 week history of cough, low-grade fever, chills, night sweats - asthma flare - started trelegy in place of symbicort, started medrol dosepak,  promethazine-DM and cefdinir   Recent consult visits: 09/08/2020 - Dr. Brien Few - Physical Medicine and Rehabilitation - notes not available  08/26/2020 - Dr. Nevada Crane - Dermatology - notes not available  08/25/2020 - Dr. Annette Stable - Neurosurgery - notes not available   Hospital visits: None in previous 6 months  Objective:  Lab Results  Component Value Date   CREATININE 0.82 09/22/2020   BUN 12 09/22/2020   GFR 101.36 09/22/2020   GFRNONAA >60 01/09/2020   GFRAA >60 04/27/2017   NA 138 09/22/2020   K 4.8 09/22/2020   CALCIUM 8.9 09/22/2020   CO2 26 09/22/2020   GLUCOSE 93 09/22/2020    Lab Results  Component Value Date/Time   HGBA1C 5.7 10/25/2017 03:47 PM   HGBA1C 4.9 10/05/2009 09:51 AM   GFR 101.36 09/22/2020 12:02 PM   GFR 92.48 07/15/2020 04:14 PM    Last diabetic Eye exam:  No results found for: HMDIABEYEEXA  Last diabetic Foot exam:  No results found for: HMDIABFOOTEX   Lab Results  Component Value Date   CHOL 138 07/05/2020   HDL 55 07/05/2020   LDLCALC 71 07/05/2020   TRIG 53 07/05/2020   CHOLHDL 2.5 07/05/2020    Hepatic Function Latest Ref Rng & Units 07/05/2020 02/02/2020 04/28/2019  Total Protein 6.0 - 8.5 g/dL 6.2 6.9 6.5  Albumin 3.8 - 4.9 g/dL 3.9 3.5 4.0  AST 0 - 40 IU/L _0 ALT 0 - 44 IU/L _1 Alk Phosphatase 44 - 121 IU/L 104 71 83  Total Bilirubin 0.0 - 1.2 mg/dL 0.4 0.3  0.4  Bilirubin, Direct 0.0 - 0.3 mg/dL - - 0.1    Lab Results  Component Value Date/Time   TSH 3.910 07/05/2020 01:58 PM   TSH 3.43 04/28/2019 11:38 AM    CBC Latest Ref Rng & Units 09/22/2020 07/15/2020 07/05/2020  WBC 4.0 - 10.5 K/uL 7.3 6.4 6.0  Hemoglobin 13.0 - 17.0 g/dL 13.0 12.6(L) 12.6(L)  Hematocrit 39.0 - 52.0 % 39.0 38.3(L) 38.8  Platelets 150.0 - 400.0 K/uL 358.0 205.0 257    Lab Results  Component Value Date/Time   VD25OH 32.12 07/15/2020 04:14 PM   VD25OH 20.7 (L) 07/05/2020 01:58 PM   VD25OH 50.18 02/24/2020 02:13 PM    Clinical ASCVD:  {YES/NO:21197} The 10-year ASCVD risk score (Arnett DK, et al., 2019) is: 3.3%   Values used to calculate the score:     Age: 26 years     Sex: Male     Is Non-Hispanic African American: No     Diabetic: No     Tobacco smoker: No     Systolic Blood Pressure: 017 mmHg     Is BP treated: Yes     HDL Cholesterol: 55 mg/dL     Total Cholesterol: 138 mg/dL    Depression screen [facility] 2/9 02/02/2020 10/25/2017 09/05/2016  Decreased Interest 0 0 1  Down, Depressed, Hopeless 0 0 0  PHQ - 2 Score 0 0 1  Altered sleeping - 3 0  Tired, decreased energy - 2 1  Change in appetite - 0 0  Feeling bad or failure about yourself  - 0 0  Trouble concentrating - 0 0  Moving slowly or fidgety/restless - 1 0  Suicidal thoughts - 0 0  PHQ-9 Score - 6 2  Difficult doing work/chores - Not difficult at all -  Some recent data might be hidden     ***Other: (CHADS2VASc if Afib, MMRC or CAT for COPD, ACT, DEXA)  Social History   Tobacco Use  Smoking Status Never  Smokeless Tobacco Never   BP Readings from Last 3 Encounters:  01/06/21 (!) 141/91  10/28/20 124/78  09/22/20 122/74   Pulse Readings from Last 3 Encounters:  01/06/21 63  10/28/20 60  09/22/20 62   Wt Readings from Last 3 Encounters:  10/28/20 208 lb (94.3 kg)  09/22/20 205 lb (93 kg)  07/15/20 208 lb (94.3 kg)   BMI Readings from Last 3 Encounters:  10/28/20 29.84 kg/m  09/22/20 29.41 kg/m  07/15/20 29.84 kg/m    Assessment/Interventions: Review of patient past medical history, allergies, medications, health status, including review of consultants reports, laboratory and other test data, was performed as part of comprehensive evaluation and provision of chronic care management services.   SDOH:  (Social Determinants of Health) assessments and interventions performed: {yes/no:20286}  SDOH Screenings   Alcohol Screen: Not on file  Depression (PHQ2-9): Low Risk    PHQ-2 Score: 0  Financial Resource Strain: Not on file   Food Insecurity: Not on file  Housing: Not on file  Physical Activity: Not on file  Social Connections: Not on file  Stress: Not on file  Tobacco Use: Low Risk    Smoking Tobacco Use: Never   Smokeless Tobacco Use: Never   Passive Exposure: Not on file  Transportation Needs: Not on file    [place]  Allergies  Allergen Reactions   Codeine Hives and Itching    Medications Reviewed Today     Reviewed by Bradley Lima, MD (Physician) on 10/28/20 at  1431  Med List Status: <None>   Medication Order Taking? Sig Documenting Provider Last Dose Status Informant  Buprenorphine HCl-Naloxone HCl 8-2 MG FILM [id number] Yes Place 1 strip under the tongue 2 (two) times daily. [provider] Taking Active   Cholecalciferol (VITAMIN D3) 50 MCG (2000 UT) TABS [id number] Yes TAKE 2 TABLETS BY MOUTH DAILY Bradley Lima, MD Taking Active   cyanocobalamin (,VITAMIN B-12,) 1000 MCG/ML injection [id number] Yes Inject 1 mL (1,000 mcg total) into the muscle every 30 (thirty) days. Bradley Lima, MD Taking Active Self  Eszopiclone 3 MG TABS [id number] Yes Take 1 tablet (3 mg total) by mouth at bedtime. Take immediately before bedtime Bradley Lima, MD Taking Active   Fe Fum-Fe Poly-Vit C-Lactobac (FUSION) 65-65-25-30 MG CAPS [id number] Yes Take 1 capsule by mouth in the morning and at bedtime. Bradley Lima, MD Taking Active   ferrous sulfate 220 (44 Fe) MG/5ML solution [id number] Yes Take 5 mLs (220 mg total) by mouth 3 (three) times daily with meals. Bradley Lima, MD Taking Active   Fluticasone-Umeclidin-Vilant [facility] ELLIPTA) 100-62.5-25 MCG/INH AEPB [id number] Yes Inhale 1 puff into the lungs daily. Bradley Lima, MD Taking Active   folic acid (FOLVITE) 1 MG tablet [id number] Yes Take 1 tablet (1 mg total) by mouth daily. Bradley Lima, MD Taking Active   INS SYRINGE/NEEDLE 1CC/28G 28G X 1/2" 1 ML MISC [id number] Yes Use to inject b12 monthly Bradley Lima, MD Taking Active Self   irbesartan (AVAPRO) 150 MG tablet [id number] Yes TAKE 1 TABLET(150 MG) BY MOUTH DAILY Bradley Lima, MD Taking Active   Multiple Vitamin (MULTIVITAMIN) tablet [id number] Yes Take 1 tablet by mouth daily. [provider] Taking Active Self  thiamine (VITAMIN B-1) 50 MG tablet [id number] Yes TAKE 1 TABLET BY MOUTH DAILY Bradley Lima, MD Taking Active   zinc gluconate 50 MG tablet [id number] Yes Take 1 tablet (50 mg total) by mouth daily. Bradley Lima, MD Taking Active             Patient Active Problem List   Diagnosis Date Noted   Contact dermatitis and eczema due to plant 11/04/2020   Cough productive of purulent sputum 10/28/2020   Cough with sputum 09/22/2020   LRTI (lower respiratory tract infection) 09/22/2020   Benign prostatic hyperplasia without lower urinary tract symptoms 04/22/2019   Gastroesophageal reflux disease without esophagitis 04/19/2018   Family history of colon cancer in mother 04/19/2018   Thiamine deficiency 04/14/2018   Other cirrhosis of liver ([place]) 03/13/2018   NASH (nonalcoholic steatohepatitis) 03/13/2018   Low libido 01/28/2018   Hyperlipidemia LDL goal <130 10/25/2017   Insomnia secondary to chronic pain 06/19/2017   Osteoarthritis of left knee 04/26/2017   Degenerative spondylolisthesis 02/24/2016   Routine general medical examination at a health care facility 06/08/2015   Cough variant asthma 02/06/2015   Zinc deficiency 04/12/2014   B12 deficiency anemia 04/08/2014   Vitamin D deficiency 07/26/2012   History of gastric bypass 07/25/2012   Anemia, iron deficiency 07/25/2012   NARCOTIC ABUSE 03/25/2009   EXOGENOUS OBESITY 08/22/2007   Osteoarthritis of right knee 03/12/2007   Essential hypertension 10/08/2006   BACK PAIN, LUMBAR 10/08/2006    Immunization History  Administered Date(s) Administered   Hep A / Hep B 09/26/2016, 10/27/2016, 01/29/2017   Influenza,inj,Quad PF,6+ Mos 12/07/2017, 04/22/2019   Moderna Sars-Covid-2  Vaccination 10/26/2019, 11/29/2019   Pneumococcal Conjugate-13 07/25/2012   Tdap 08/28/2007, 09/05/2016    Conditions  to be addressed/monitored:  {USCCMDZASSESSMENTOPTIONS:23563}  There are no care plans that you recently modified to display for this patient.     Medication Assistance: {MEDASSISTANCEINFO:25044}  Compliance/Adherence/Medication fill history: Care Gaps: ***  Patient's preferred pharmacy is:  [id number] - [place], [place] AT [address] [address] Phone: [phone] Fax: [facility] #[id number] - [place], [place] - [address] AT [place] [address] [place] [address] Phone: [phone] Fax: [phone]  [place], [address] [place] [address] Phone: [phone] Fax: [phone]   Uses pill box? {Yes or If no, why not?:20788} Pt endorses ***% compliance  Care Plan and Follow Up Patient Decision:  {FOLLOWUP:24991}  Plan: {CM FOLLOW UP PLAN:25073}  ***  CARE PLAN ENTRY  Current Barriers:  Chronic Disease Management support, education, and care coordination needs related to Hypertension and Vitamin Deficiencies   Hypertension(Goal BP <130/80) BP Readings from Last 3 Encounters:  01/06/21 (!) 141/91  10/28/20 124/78  09/22/20 122/74   Pharmacist Clinical Goal(s): Over the next 180 days, patient will work with PharmD and providers to maintain BP goal <130/80 Current regimen:  Indapamide 1.25 mg daily *** Irbesartan 150 mg daily Interventions: Discussed benefits of monitoring BP at home Patient self care activities - Over the next 180 days, patient will: Check BP 1-2 times weekly, document, and provide at future appointments Ensure daily salt intake < 2300 mg/day   Vitamin Deficiencies  Ref. Range 04/28/2019 11:38  Vitamin B1 (Thiamine) Latest  Ref Range: 8 - 30 nmol/L 18  VITD Latest Ref Range: 30.00 - 100.00 ng/mL 24.74 (L)  Vitamin B12 Latest Ref Range: 211 - 911 pg/mL 452  Zinc Latest Ref Range: 60 - 130 mcg/dL 41 (L)  Pharmacist Clinical Goal(s) Over the next 180 days, patient will work with PharmD and providers to optimize vitamin regimen Current regimen:  Vitamin B12 1000 mcg/mL injection Vitamin D 2000 IU - 2 tablet daily Folic Acid 85m daily  Thiamine 537mdaily  Ferrous Sulfate 22019mmL - 220m25mtimes daily  Zinc 50 mg daily Multivitamin Interventions: Discussed optimal vitamin ranges  Patient self care activities - Over the next 180 days, patient will: Continue supplements as directed  Medication management Pharmacist Clinical Goal(s): Over the next 180 days, patient will work with PharmD and providers to maintain optimal medication adherence Current pharmacy: [facility] Interventions Comprehensive medication review performed. Continue current medication management strategy Patient self care activities - Over the next 180 days, patient will: Focus on medication adherence by fill date Take medications as prescribed Report any questions or concerns to PharmD and/or provider(s)  Chart prep = 13 minutes

## 2021-01-28 ENCOUNTER — Other Ambulatory Visit: Payer: Self-pay | Admitting: Internal Medicine

## 2021-01-28 DIAGNOSIS — I10 Essential (primary) hypertension: Secondary | ICD-10-CM

## 2021-02-03 ENCOUNTER — Other Ambulatory Visit: Payer: Self-pay | Admitting: Internal Medicine

## 2021-02-03 DIAGNOSIS — Z9884 Bariatric surgery status: Secondary | ICD-10-CM

## 2021-02-03 DIAGNOSIS — E60 Dietary zinc deficiency: Secondary | ICD-10-CM

## 2021-02-08 DIAGNOSIS — L218 Other seborrheic dermatitis: Secondary | ICD-10-CM | POA: Diagnosis not present

## 2021-02-08 DIAGNOSIS — M5416 Radiculopathy, lumbar region: Secondary | ICD-10-CM | POA: Diagnosis not present

## 2021-02-09 ENCOUNTER — Telehealth: Payer: Self-pay | Admitting: *Deleted

## 2021-02-09 NOTE — Telephone Encounter (Signed)
   [facility] Pre-operative Risk Assessment    Patient Name: Bradley [name]  DOB: 1970-12-30 MRN: [id number]  HEARTCARE STAFF:  - IMPORTANT!!!!!! Under Visit Info/Reason for Call, type in Other and utilize the format Clearance MM/DD/YY or Clearance TBD. Do not use dashes or single digits. - Please review there is not already an duplicate clearance open for this procedure. - If request is for dental extraction, please clarify the # of teeth to be extracted. - If the patient is currently at the dentist's office, call Pre-Op Callback Staff (MA/nurse) to input urgent request.  - If the patient is not currently in the dentist office, please route to the Pre-Op pool.  Request for surgical clearance:  What type of surgery is being performed?  L4-5 EXTRAFORAMINAL LAMINECTOMY  When is this surgery scheduled?  03/11/21  What type of clearance is required (medical clearance vs. Pharmacy clearance to hold med vs. Both)?  MEDICAL  Are there any medications that need to be held prior to surgery and how long?  N/A  Practice name and name of physician performing surgery?  [facility] / DR. POOL  What is the office phone number?  [phone]   7.   What is the office fax number?  [phone]  8.   Anesthesia type (None, local, MAC, general) ?  GENERAL   Jeanann Lewandowsky 02/09/2021, 3:44 PM  _________________________________________________________________   (provider comments below)

## 2021-02-10 NOTE — Telephone Encounter (Signed)
   Patient Name: Bradley [name]  DOB: [birth year] MRN: [id number]  Primary Cardiologist: Dr. [name]  Chart reviewed as part of pre-operative protocol coverage. The patient has an upcoming appointment scheduled TOMORROW 02/11/21 with Dr. Harrington Challenger at which time this clearance can be addressed in case there are any issues that would impact surgical clearance. I added preop FYI to appointment notes so that provider is aware to address at time of OV. Will also cc her and covering RN.  Per office protocol, the cardiology provider should forward their finalized clearance decision to requesting party below. I will route this message as FYI to requesting party and remove this message from the pre-op box as separate preop APP input not needed at this time.  Please call with questions.  Charlie Pitter, PA-C 02/10/2021, 2:23 PM

## 2021-02-11 ENCOUNTER — Ambulatory Visit: Payer: Medicare Other | Admitting: Internal Medicine

## 2021-02-11 NOTE — Telephone Encounter (Signed)
Noted apt change

## 2021-02-11 NOTE — Telephone Encounter (Signed)
Pt rescheduled his preop appt with Dr. Harrington Challenger today and plans to see her in [place] 02/15/21.   Will forward to her Jena and to the preop op pool for updated information.

## 2021-02-14 NOTE — Progress Notes (Signed)
Cardiology Office Note   Date:  02/15/2021   ID:  Bradley [name], DOB 1970-09-22, MRN [id number]  PCP:  [name], MD  Cardiologist:   [name], MD   Pt presents for f/u of HTN   History of Present Illness: Bradley [name] is a 50 y.o. male with a history of HTN, obesity (s/p gastric bypass) and murmur  I saw him in 2016  Echo showed aortic sclerosis  CT did show coronary calcifications.    I last saw the pt in clinic in Spring  2022  Pt denies CP   Breathing is only short when Fe is low    No dizziness    Supposed to have back surgery on 03/11/21 Current Outpatient Medications  Medication Sig Dispense Refill   Cholecalciferol (VITAMIN D3) 50 MCG (2000 UT) TABS TAKE 2 TABLETS BY MOUTH DAILY 180 tablet 0   cyanocobalamin (,VITAMIN B-12,) 1000 MCG/ML injection ADMINISTER 1 ML(1000 MCG) IN THE MUSCLE EVERY 30 DAYS 3 mL 0   Eszopiclone 3 MG TABS TAKE 1 TABLET(3 MG) BY MOUTH AT BEDTIME 90 tablet 0   Fe Fum-Fe Poly-Vit C-Lactobac (FUSION) 65-65-25-30 MG CAPS Take 1 capsule by mouth in the morning and at bedtime. 180 capsule 1   ferrous sulfate 220 (44 Fe) MG/5ML solution Take 5 mLs (220 mg total) by mouth 3 (three) times daily with meals. 450 mL 1   fluocinonide-emollient (LIDEX-E) 0.05 % cream Apply 1 application topically 2 (two) times daily. 120 g 0   Fluticasone-Umeclidin-Vilant (TRELEGY ELLIPTA) 100-62.5-25 MCG/INH AEPB Inhale 1 puff into the lungs daily. 176 each 1   folic acid (FOLVITE) 1 MG tablet TAKE 1 TABLET(1 MG) BY MOUTH DAILY 90 tablet 1   gabapentin (NEURONTIN) 300 MG capsule Take 300 mg by mouth 3 (three) times daily.     hydrOXYzine (VISTARIL) 25 MG capsule Take 1 capsule (25 mg total) by mouth every 8 (eight) hours as needed. 30 capsule 0   imiquimod (ALDARA) 5 % cream Apply topically.     INS SYRINGE/NEEDLE 1CC/28G 28G X 1/2" 1 ML MISC Use to inject b12 monthly 10 each 1   irbesartan (AVAPRO) 150 MG tablet TAKE 1 TABLET(150 MG) BY MOUTH DAILY 90 tablet 0    Multiple Vitamin (MULTIVITAMIN) tablet Take 1 tablet by mouth daily.     sildenafil (REVATIO) 20 MG tablet Take 5 tablets by mouth as directed.     SYMBICORT 80-4.5 MCG/ACT inhaler Inhale 2 puffs into the lungs 2 (two) times daily.     thiamine (VITAMIN B-1) 50 MG tablet TAKE 1 TABLET BY MOUTH DAILY 90 tablet 1   zinc gluconate 50 MG tablet TAKE 1 TABLET(50 MG) BY MOUTH DAILY 90 tablet 0   Buprenorphine HCl-Naloxone HCl 8-2 MG FILM Place 1 strip under the tongue 2 (two) times daily. (Patient not taking: Reported on 02/15/2021)     Oxycodone HCl 10 MG TABS Take 10 mg by mouth 5 (five) times daily as needed.     No current facility-administered medications for this visit.    Allergies:   Codeine   Past Medical History:  Diagnosis Date   Allergy    Anemia    since gastric bypass   Anxiety    Aortic valve sclerosis    Arthritis    Arthropathy, unspecified, other specified sites    CAD (coronary artery disease)    01/09/20: patient denied; faint coronary calcifications on 2016 CT   Contact with or exposure to venereal diseases  Depression    Displacement of intervertebral disc, site unspecified, without myelopathy    GERD (gastroesophageal reflux disease)    Heart murmur    mild MR, mild AS 06/2019 (Dr. [name])   History of kidney stones    Hypertension     " no longer have hypertension since gastric bypass "   Lumbago    Obesity, unspecified    Other, mixed, or unspecified nondependent drug abuse, unspecified    Personal history of urinary calculi    Pneumonia    Priapism    Stenosis, spinal, lumbar     Past Surgical History:  Procedure Laterality Date   ABDOMINAL SURGERY     ANKLE SURGERY     ANTERIOR LAT LUMBAR FUSION Left 02/24/2016   Procedure: LEFT LUMBAR TWO-THREE  ANTERIOR LATERAL LUMBAR FUSION  ;  Surgeon: Earnie Larsson, MD;  Location: [facility];  Service: Neurosurgery;  Laterality: Left;   ANTERIOR LATERAL LUMBAR FUSION WITH PERCUTANEOUS SCREW 1 LEVEL Right 01/13/2020    Procedure: RIGHT LUMBAR ONE-TWO ANTERIOR LATERAL LUMBAR FUSION WITH PERCUTANEOUS SCREW PLACEMENT;  Surgeon: Earnie Larsson, MD;  Location: [facility];  Service: Neurosurgery;  Laterality: Right;  3C   BACK SURGERY     GASTRIC BYPASS     GASTRIC BYPASS  2008   HERNIA REPAIR     INCISION AND DRAINAGE ABSCESS Right 07/06/2004   great toe   IRRIGATION AND DEBRIDEMENT ABSCESS Right 07/08/2004   great toe   KNEE ARTHROPLASTY Right 04/26/2017   Procedure: RIGHT TOTAL KNEE ARTHROPLASTY WITH COMPUTER NAVIGATION;  Surgeon: Rod Can, MD;  Location: [facility];  Service: Orthopedics;  Laterality: Right;  Needs RNFA   KNEE SURGERY     LUMBAR PERCUTANEOUS PEDICLE SCREW 1 LEVEL Left 02/24/2016   Procedure: LUMBAR PERCUTANEOUS PEDICLE SCREW 1 LEVEL;  Surgeon: Earnie Larsson, MD;  Location: [facility];  Service: Neurosurgery;  Laterality: Left;   MICRODISCECTOMY LUMBAR Left 08/24/2008   T12 - L1   panectomy     TOTAL KNEE ARTHROPLASTY Right 04/2017     Social History:  The patient  reports that he has never smoked. He has never used smokeless tobacco. He reports that he does not drink alcohol and does not use drugs.   Family History:  The patient's family history includes Arthritis in his mother; Breast cancer in his mother; Colon cancer in his mother; Heart disease in his father; Hypertension in his father; Kidney failure in his mother.    ROS:  Please see the history of present illness. All other systems are reviewed and  Negative to the above problem except as noted.    PHYSICAL EXAM: VS:  BP 140/80 (BP Location: Left Arm, Patient Position: Sitting, Cuff Size: Normal)   Pulse (!) 54   Ht 5' 10"  (1.778 m)   Wt 219 lb (99.3 kg)   SpO2 99%   BMI 31.42 kg/m   GEN: Obese 50 yo in no acute distress  HEENT: normal  Neck: JVP normal  , no carotid bruits, Cardiac: RRR; Gr I/VI systolic murmur apex  No LE edema  Respiratory:  clear to auscultation bilaterally, GI: soft, nontender, nondistended, + BS  No  hepatomegaly  MS: no deformity Moving all extremities   Skin: warm and dry, no rash Neuro:  Strength and sensation are intact Psych: euthymic mood, full affect   EKG:  EKG is  ordered today     SB 54  bpm    Echo   April 2021  Left ventricular  ejection fraction, by estimation, is 55 to 60%. The left ventricle has normal function. The left ventricle has no regional wall motion abnormalities. There is mild left ventricular hypertrophy. Left ventricular diastolic parameters were normal. 2. Right ventricular systolic function is normal. The right ventricular size is normal. There is mildly elevated pulmonary artery systolic pressure. 3. The mitral valve is grossly normal. Mild mitral valve regurgitation. 4. AV is thickened, calcified with very mildly restricted motion. Peak and mean gradients thruogh the valve are 23 and 11 mm Hg respectively consistent with mild AS.Marland Kitchen The aortic valve is tricuspid. Aortic valve regurgitation is not visualized. 5. The inferior vena cava is normal in size with greater than 50% respiratory variability, suggesting right atrial pressure of 3 mmHg.  Lipid Panel    Component Value Date/Time   CHOL 138 07/05/2020 1358   TRIG 53 07/05/2020 1358   HDL 55 07/05/2020 1358   CHOLHDL 2.5 07/05/2020 1358   CHOLHDL 2 04/28/2019 1138   VLDL 13.4 04/28/2019 1138   LDLCALC 71 07/05/2020 1358      Wt Readings from Last 3 Encounters:  02/15/21 219 lb (99.3 kg)  10/28/20 208 lb (94.3 kg)  09/22/20 205 lb (93 kg)      ASSESSMENT AND PLAN: 1  CAD Coronary calcifications on CT in past   No symptoms  of angina  Active   Up/down stairs  without difficulty     2  Aortic stenosis   Echo in April 2021 Mild   Murmur is unchanged     Plan for f/u echo later this summer   3  HL  LDL 71  HDL 55   FOllow   Not on statin      4  HTN  Pt says BP is better at home  120s/   FOllow   5  Preop risk   Pt is at low risk for major cardiac event with planned back surgery   OK to  proceed,   F/U in 1 year   Signed, [name], MD  02/15/2021 1:48 PM    [facility] [address], [place], [place]  [address] Phone: [phone]; Fax: [phone]

## 2021-02-15 ENCOUNTER — Encounter: Payer: Self-pay | Admitting: Internal Medicine

## 2021-02-15 ENCOUNTER — Other Ambulatory Visit: Payer: Self-pay

## 2021-02-15 ENCOUNTER — Ambulatory Visit: Payer: Medicare Other | Admitting: Internal Medicine

## 2021-02-15 VITALS — BP 140/80 | HR 54 | Ht 70.0 in | Wt 219.0 lb

## 2021-02-15 DIAGNOSIS — I10 Essential (primary) hypertension: Secondary | ICD-10-CM

## 2021-02-15 DIAGNOSIS — I350 Nonrheumatic aortic (valve) stenosis: Secondary | ICD-10-CM

## 2021-02-15 NOTE — Patient Instructions (Signed)
Medication Instructions:  Your physician recommends that you continue on your current medications as directed. Please refer to the Current Medication list given to you today.  *If you need a refill on your cardiac medications before your next appointment, please call your pharmacy*   Lab Work: None If you have labs (blood work) drawn today and your tests are completely normal, you will receive your results only by: [place] (if you have MyChart) OR A paper copy in the mail If you have any lab test that is abnormal or we need to change your treatment, we will call you to review the results.   Testing/Procedures: Your physician has requested that you have an echocardiogram. Echocardiography is a painless test that uses sound waves to create images of your heart. It provides your doctor with information about the size and shape of your heart and how well your hearts chambers and valves are working. This procedure takes approximately one hour. There are no restrictions for this procedure.    Follow-Up: At [facility], you and your health needs are our priority.  As part of our continuing mission to provide you with exceptional heart care, we have created designated Provider Care Teams.  These Care Teams include your primary Cardiologist (physician) and Advanced Practice Providers (APPs -  Physician Assistants and Nurse Practitioners) who all work together to provide you with the care you need, when you need it.  We recommend signing up for the patient portal called "MyChart".  Sign up information is provided on this After Visit Summary.  MyChart is used to connect with patients for Virtual Visits (Telemedicine).  Patients are able to view lab/test results, encounter notes, upcoming appointments, etc.  Non-urgent messages can be sent to your provider as well.   To learn more about what you can do with MyChart, go to [email or web address].    Your next appointment:   July 2023  The  format for your next appointment:   In Person  Provider:   [name], MD    Other Instructions

## 2021-03-02 ENCOUNTER — Other Ambulatory Visit: Payer: Self-pay

## 2021-03-02 ENCOUNTER — Other Ambulatory Visit: Payer: Self-pay | Admitting: Internal Medicine

## 2021-03-02 ENCOUNTER — Ambulatory Visit (INDEPENDENT_AMBULATORY_CARE_PROVIDER_SITE_OTHER): Payer: Medicare Other | Admitting: Internal Medicine

## 2021-03-02 ENCOUNTER — Encounter: Payer: Self-pay | Admitting: Internal Medicine

## 2021-03-02 VITALS — BP 124/86 | HR 66 | Temp 98.2°F | Resp 16 | Ht 70.0 in | Wt 215.0 lb

## 2021-03-02 DIAGNOSIS — Z9884 Bariatric surgery status: Secondary | ICD-10-CM

## 2021-03-02 DIAGNOSIS — B372 Candidiasis of skin and nail: Secondary | ICD-10-CM | POA: Diagnosis not present

## 2021-03-02 DIAGNOSIS — L03316 Cellulitis of umbilicus: Secondary | ICD-10-CM

## 2021-03-02 DIAGNOSIS — E519 Thiamine deficiency, unspecified: Secondary | ICD-10-CM

## 2021-03-02 MED ORDER — KETOCONAZOLE 200 MG PO TABS: 200.0000 mg | ORAL_TABLET | Freq: Every day | ORAL | 0 refills | Status: AC

## 2021-03-02 MED ORDER — SULFAMETHOXAZOLE-TRIMETHOPRIM 800-160 MG PO TABS: 1.0000 | ORAL_TABLET | Freq: Two times a day (BID) | ORAL | 0 refills | Status: AC

## 2021-03-02 NOTE — Progress Notes (Signed)
Subjective:  Patient ID: Bradley [name], male    DOB: 01/25/1971  Age: 50 y.o. MRN: [id number]  CC: Office Visit (Abscess draining inside belly button)  This visit occurred during the SARS-CoV-2 public health emergency.  Safety protocols were in place, including screening questions prior to the visit, additional usage of staff PPE, and extensive cleaning of exam room while observing appropriate contact time as indicated for disinfecting solutions.    HPI Bradley [name] presents for f/up -  He complains of a one week hx of pain, foul odor, and drainage from his umbilicus.  Outpatient Medications Prior to Visit  Medication Sig Dispense Refill   Cholecalciferol (VITAMIN D3) 50 MCG (2000 UT) TABS TAKE 2 TABLETS BY MOUTH DAILY 180 tablet 0   cyanocobalamin (,VITAMIN B-12,) 1000 MCG/ML injection ADMINISTER 1 ML(1000 MCG) IN THE MUSCLE EVERY 30 DAYS 3 mL 0   Eszopiclone 3 MG TABS TAKE 1 TABLET(3 MG) BY MOUTH AT BEDTIME 90 tablet 0   Fe Fum-Fe Poly-Vit C-Lactobac (FUSION) 65-65-25-30 MG CAPS Take 1 capsule by mouth in the morning and at bedtime. 180 capsule 1   ferrous sulfate 220 (44 Fe) MG/5ML solution Take 5 mLs (220 mg total) by mouth 3 (three) times daily with meals. 450 mL 1   fluocinonide-emollient (LIDEX-E) 0.05 % cream Apply 1 application topically 2 (two) times daily. 120 g 0   Fluticasone-Umeclidin-Vilant (TRELEGY ELLIPTA) 100-62.5-25 MCG/INH AEPB Inhale 1 puff into the lungs daily. 315 each 1   folic acid (FOLVITE) 1 MG tablet TAKE 1 TABLET(1 MG) BY MOUTH DAILY 90 tablet 1   gabapentin (NEURONTIN) 300 MG capsule Take 300 mg by mouth 3 (three) times daily.     hydrOXYzine (VISTARIL) 25 MG capsule Take 1 capsule (25 mg total) by mouth every 8 (eight) hours as needed. 30 capsule 0   imiquimod (ALDARA) 5 % cream Apply topically.     INS SYRINGE/NEEDLE 1CC/28G 28G X 1/2" 1 ML MISC Use to inject b12 monthly 10 each 1   irbesartan (AVAPRO) 150 MG tablet TAKE 1 TABLET(150 MG) BY MOUTH DAILY  90 tablet 0   Multiple Vitamin (MULTIVITAMIN) tablet Take 1 tablet by mouth daily.     Oxycodone HCl 10 MG TABS Take 10 mg by mouth 5 (five) times daily as needed.     sildenafil (REVATIO) 20 MG tablet Take 5 tablets by mouth as directed.     SYMBICORT 80-4.5 MCG/ACT inhaler Inhale 2 puffs into the lungs 2 (two) times daily.     thiamine (VITAMIN B-1) 50 MG tablet TAKE 1 TABLET BY MOUTH DAILY 90 tablet 1   zinc gluconate 50 MG tablet TAKE 1 TABLET(50 MG) BY MOUTH DAILY 90 tablet 0   Buprenorphine HCl-Naloxone HCl 8-2 MG FILM Place 1 strip under the tongue 2 (two) times daily. (Patient not taking: Reported on 02/15/2021)     No facility-administered medications prior to visit.    ROS Review of Systems  Constitutional:  Negative for chills, diaphoresis, fatigue and fever.  HENT: Negative.    Eyes: Negative.   Cardiovascular:  Negative for chest pain, palpitations and leg swelling.  Gastrointestinal:  Negative for abdominal pain.  Endocrine: Negative.   Genitourinary: Negative.  Negative for difficulty urinating.  Musculoskeletal: Negative.   Skin: Negative.   Neurological: Negative.   Hematological:  Negative for adenopathy. Does not bruise/bleed easily.  Psychiatric/Behavioral: Negative.     Objective:  BP 124/86 (BP Location: Right Arm, Patient Position: Sitting, Cuff Size: Large)  Pulse 66    Temp 98.2 F (36.8 C) (Oral)    Resp 16    Ht 5' 10"  (1.778 m)    Wt 215 lb (97.5 kg)    SpO2 96%    BMI 30.85 kg/m   BP Readings from Last 3 Encounters:  03/02/21 124/86  02/15/21 140/80  01/06/21 (!) 141/91    Wt Readings from Last 3 Encounters:  03/02/21 215 lb (97.5 kg)  02/15/21 219 lb (99.3 kg)  10/28/20 208 lb (94.3 kg)    Physical Exam Vitals reviewed.  HENT:     Nose: Nose normal.     Mouth/Throat:     Mouth: Mucous membranes are moist.  Eyes:     General: No scleral icterus.    Conjunctiva/sclera: Conjunctivae normal.  Cardiovascular:     Rate and Rhythm: Normal  rate and regular rhythm.     Heart sounds: Murmur heard.  Systolic murmur is present with a grade of 1/6.  No diastolic murmur is present.    No gallop.  Pulmonary:     Effort: Pulmonary effort is normal.     Breath sounds: No stridor. No wheezing, rhonchi or rales.  Abdominal:     General: Abdomen is flat.     Tenderness: There is no abdominal tenderness.    Musculoskeletal:        General: Normal range of motion.     Cervical back: Neck supple.     Right lower leg: No edema.     Left lower leg: No edema.  Lymphadenopathy:     Cervical: No cervical adenopathy.  Skin:    General: Skin is warm and dry.     Findings: No rash.    Lab Results  Component Value Date   WBC 7.3 09/22/2020   HGB 13.0 09/22/2020   HCT 39.0 09/22/2020   PLT 358.0 09/22/2020   GLUCOSE 93 09/22/2020   CHOL 138 07/05/2020   TRIG 53 07/05/2020   HDL 55 07/05/2020   LDLCALC 71 07/05/2020   ALT 24 07/05/2020   AST 18 07/05/2020   NA 138 09/22/2020   K 4.8 09/22/2020   CL 105 09/22/2020   CREATININE 0.82 09/22/2020   BUN 12 09/22/2020   CO2 26 09/22/2020   TSH 3.910 07/05/2020   PSA 0.6 07/05/2020   HGBA1C 5.7 10/25/2017    CT LUMBAR SPINE W CONTRAST  Result Date: 01/06/2021 CLINICAL DATA:  Lumbar radiculopathy. Low back and left greater than right posterior leg pain. Prior lumbar fusion. EXAM: LUMBAR MYELOGRAM FLUOROSCOPY TIME:  Fluoroscopy Time: 43 seconds Radiation Exposure Index: 407.55 microGray*m^2 PROCEDURE: After thorough discussion of risks and benefits of the procedure including bleeding, infection, injury to nerves, blood vessels, adjacent structures as well as headache and CSF leak, written and oral informed consent was obtained. Consent was obtained by Dr. Logan Bores. Time out form was completed. Patient was positioned prone on the fluoroscopy table. Local anesthesia was provided with 1% lidocaine without epinephrine after prepped and draped in the usual sterile fashion. Puncture was  performed at L5-S1 using a 3 1/2 inch 22-gauge spinal needle via a right interlaminar approach. Using a single pass through the dura, the needle was placed within the thecal sac, with return of clear CSF. 15 mL of Isovue M-200 was injected into the thecal sac, with normal opacification of the nerve roots and cauda equina consistent with free flow within the subarachnoid space. I personally performed the lumbar puncture and administered the intrathecal contrast. I also  personally supervised acquisition of the myelogram images. TECHNIQUE: Contiguous axial images were obtained through the Lumbar spine after the intrathecal infusion of contrast. Coronal and sagittal reconstructions were obtained of the axial image sets. COMPARISON:  Lumbar spine MRI 08/18/2020 FINDINGS: LUMBAR MYELOGRAM FINDINGS: There are 5 non rib-bearing lumbar type vertebrae. There is trace retrolisthesis of L2 on L3 and L3 on L4 with the latter at most subtly increasing with extension. L1-L3 fusion is noted with wide patency of the spinal canal at these levels. There is a small ventral extradural defect at L3-4 without evidence of significant stenosis. CT LUMBAR MYELOGRAM FINDINGS: Vertebral alignment is unchanged from the prior MRI with mildly exaggerated lumbar lordosis and trace retrolisthesis of L2 on L3 and L3 on L4. There is mild lumbar dextroscoliosis. No fracture or suspicious osseous lesion is identified. Prior lateral interbody fusion is again noted at L1-2 and L2-3 with posterior pedicle screw fusion on the right at L1-2 and on the left at L2-3. Solid interbody osseous fusion is present at both levels. There is no evidence of screw loosening. The conus medullaris terminates at L1. There is mild abdominal aortic atherosclerosis without aneurysm. T12-L1: Spondylosis with endplate spurring resulting in mild medial left neural foraminal stenosis, unchanged. L1-2: Prior fusion.  No stenosis. L2-3: Prior fusion.  No stenosis. L3-4: Disc  bulging and mild facet hypertrophy result in unchanged mild left greater than right neural foraminal stenosis without spinal stenosis. L4-5: Left eccentric disc bulging and mild facet hypertrophy result in moderate to severe left neural foraminal stenosis with left L4 nerve root flattening, stable to mildly increased. Patent spinal canal and right neural foramen. L5-S1: Disc bulging and moderate right and mild left facet hypertrophy result in mild-to-moderate right and minimal to mild left neural foraminal stenosis without spinal stenosis, unchanged. IMPRESSION: 1. Solid L1-L3 fusion without significant stenosis. 2. Moderate to severe left neural foraminal stenosis at L4-5, stable to mildly increased. 3. Unchanged mild-to-moderate right neural foraminal stenosis at L5-S1. 4. Aortic Atherosclerosis (ICD10-I70.0). Electronically Signed   By: Logan Bores M.D.   On: 01/06/2021 16:44   DG MYELOGRAPHY LUMBAR INJ LUMBOSACRAL  Result Date: 01/06/2021 CLINICAL DATA:  Lumbar radiculopathy. Low back and left greater than right posterior leg pain. Prior lumbar fusion. EXAM: LUMBAR MYELOGRAM FLUOROSCOPY TIME:  Fluoroscopy Time: 43 seconds Radiation Exposure Index: 407.55 microGray*m^2 PROCEDURE: After thorough discussion of risks and benefits of the procedure including bleeding, infection, injury to nerves, blood vessels, adjacent structures as well as headache and CSF leak, written and oral informed consent was obtained. Consent was obtained by Dr. Logan Bores. Time out form was completed. Patient was positioned prone on the fluoroscopy table. Local anesthesia was provided with 1% lidocaine without epinephrine after prepped and draped in the usual sterile fashion. Puncture was performed at L5-S1 using a 3 1/2 inch 22-gauge spinal needle via a right interlaminar approach. Using a single pass through the dura, the needle was placed within the thecal sac, with return of clear CSF. 15 mL of Isovue M-200 was injected into the  thecal sac, with normal opacification of the nerve roots and cauda equina consistent with free flow within the subarachnoid space. I personally performed the lumbar puncture and administered the intrathecal contrast. I also personally supervised acquisition of the myelogram images. TECHNIQUE: Contiguous axial images were obtained through the Lumbar spine after the intrathecal infusion of contrast. Coronal and sagittal reconstructions were obtained of the axial image sets. COMPARISON:  Lumbar spine MRI 08/18/2020 FINDINGS: LUMBAR MYELOGRAM FINDINGS:  There are 5 non rib-bearing lumbar type vertebrae. There is trace retrolisthesis of L2 on L3 and L3 on L4 with the latter at most subtly increasing with extension. L1-L3 fusion is noted with wide patency of the spinal canal at these levels. There is a small ventral extradural defect at L3-4 without evidence of significant stenosis. CT LUMBAR MYELOGRAM FINDINGS: Vertebral alignment is unchanged from the prior MRI with mildly exaggerated lumbar lordosis and trace retrolisthesis of L2 on L3 and L3 on L4. There is mild lumbar dextroscoliosis. No fracture or suspicious osseous lesion is identified. Prior lateral interbody fusion is again noted at L1-2 and L2-3 with posterior pedicle screw fusion on the right at L1-2 and on the left at L2-3. Solid interbody osseous fusion is present at both levels. There is no evidence of screw loosening. The conus medullaris terminates at L1. There is mild abdominal aortic atherosclerosis without aneurysm. T12-L1: Spondylosis with endplate spurring resulting in mild medial left neural foraminal stenosis, unchanged. L1-2: Prior fusion.  No stenosis. L2-3: Prior fusion.  No stenosis. L3-4: Disc bulging and mild facet hypertrophy result in unchanged mild left greater than right neural foraminal stenosis without spinal stenosis. L4-5: Left eccentric disc bulging and mild facet hypertrophy result in moderate to severe left neural foraminal stenosis  with left L4 nerve root flattening, stable to mildly increased. Patent spinal canal and right neural foramen. L5-S1: Disc bulging and moderate right and mild left facet hypertrophy result in mild-to-moderate right and minimal to mild left neural foraminal stenosis without spinal stenosis, unchanged. IMPRESSION: 1. Solid L1-L3 fusion without significant stenosis. 2. Moderate to severe left neural foraminal stenosis at L4-5, stable to mildly increased. 3. Unchanged mild-to-moderate right neural foraminal stenosis at L5-S1. 4. Aortic Atherosclerosis (ICD10-I70.0). Electronically Signed   By: Logan Bores M.D.   On: 01/06/2021 16:44    Assessment & Plan:   Sarthak was seen today for office visit.  Diagnoses and all orders for this visit:  Cellulitis, umbilical - Will treat for MRSA with bactrim-ds. -     WOUND CULTURE; Future -     sulfamethoxazole-trimethoprim (BACTRIM DS) 800-160 MG tablet; Take 1 tablet by mouth 2 (two) times daily for 7 days. -     ketoconazole (NIZORAL) 200 MG tablet; Take 1 tablet (200 mg total) by mouth daily for 7 days. -     WOUND CULTURE  Candidal skin infection - will treat for yeast and candida with ketoconazole. -     ketoconazole (NIZORAL) 200 MG tablet; Take 1 tablet (200 mg total) by mouth daily for 7 days.   I am having Dallen Bunte. Reaves start on sulfamethoxazole-trimethoprim and ketoconazole. I am also having him maintain his multivitamin, INS SYRINGE/NEEDLE 1CC/28G, Buprenorphine HCl-Naloxone HCl, ferrous sulfate, Fusion, Trelegy Ellipta, hydrOXYzine, fluocinonide-emollient, Vitamin D3, Eszopiclone, folic acid, irbesartan, zinc gluconate, cyanocobalamin, sildenafil, Symbicort, gabapentin, imiquimod, Oxycodone HCl, and thiamine.  Meds ordered this encounter  Medications   sulfamethoxazole-trimethoprim (BACTRIM DS) 800-160 MG tablet    Sig: Take 1 tablet by mouth 2 (two) times daily for 7 days.    Dispense:  14 tablet    Refill:  0   ketoconazole (NIZORAL) 200 MG  tablet    Sig: Take 1 tablet (200 mg total) by mouth daily for 7 days.    Dispense:  7 tablet    Refill:  0     Follow-up: Return in about 3 months (around 05/31/2021).  [name], MD

## 2021-03-02 NOTE — Patient Instructions (Signed)

## 2021-03-05 LAB — WOUND CULTURE

## 2021-03-09 ENCOUNTER — Other Ambulatory Visit: Payer: Self-pay | Admitting: Internal Medicine

## 2021-03-09 DIAGNOSIS — E559 Vitamin D deficiency, unspecified: Secondary | ICD-10-CM

## 2021-03-09 DIAGNOSIS — Z9884 Bariatric surgery status: Secondary | ICD-10-CM

## 2021-03-11 DIAGNOSIS — M5416 Radiculopathy, lumbar region: Secondary | ICD-10-CM | POA: Diagnosis not present

## 2021-03-11 DIAGNOSIS — M48061 Spinal stenosis, lumbar region without neurogenic claudication: Secondary | ICD-10-CM | POA: Diagnosis not present

## 2021-04-17 ENCOUNTER — Other Ambulatory Visit: Payer: Self-pay | Admitting: Internal Medicine

## 2021-04-17 DIAGNOSIS — D508 Other iron deficiency anemias: Secondary | ICD-10-CM

## 2021-04-18 DIAGNOSIS — G894 Chronic pain syndrome: Secondary | ICD-10-CM | POA: Diagnosis not present

## 2021-04-18 DIAGNOSIS — M961 Postlaminectomy syndrome, not elsewhere classified: Secondary | ICD-10-CM | POA: Diagnosis not present

## 2021-04-18 DIAGNOSIS — M5416 Radiculopathy, lumbar region: Secondary | ICD-10-CM | POA: Diagnosis not present

## 2021-04-19 ENCOUNTER — Encounter: Payer: Self-pay | Admitting: Internal Medicine

## 2021-04-19 DIAGNOSIS — D508 Other iron deficiency anemias: Secondary | ICD-10-CM

## 2021-04-20 MED ORDER — FUSION 65-65-25-30 MG PO CAPS: ORAL_CAPSULE | ORAL | 1 refills | Status: DC

## 2021-04-30 ENCOUNTER — Other Ambulatory Visit: Payer: Self-pay | Admitting: Internal Medicine

## 2021-04-30 DIAGNOSIS — I10 Essential (primary) hypertension: Secondary | ICD-10-CM

## 2021-05-05 ENCOUNTER — Other Ambulatory Visit: Payer: Self-pay | Admitting: Internal Medicine

## 2021-05-11 ENCOUNTER — Ambulatory Visit: Payer: Medicare Other

## 2021-05-18 ENCOUNTER — Other Ambulatory Visit: Payer: Self-pay | Admitting: Internal Medicine

## 2021-05-18 DIAGNOSIS — Z9884 Bariatric surgery status: Secondary | ICD-10-CM

## 2021-05-18 DIAGNOSIS — E60 Dietary zinc deficiency: Secondary | ICD-10-CM

## 2021-05-20 ENCOUNTER — Ambulatory Visit (HOSPITAL_COMMUNITY)
Admission: RE | Admit: 2021-05-20 | Discharge: 2021-05-20 | Disposition: A | Discharge status: Home / Self Care | Payer: Medicare Other | Source: Ambulatory Visit | Attending: Internal Medicine | Admitting: Internal Medicine

## 2021-05-20 ENCOUNTER — Other Ambulatory Visit: Payer: Self-pay

## 2021-05-20 DIAGNOSIS — I350 Nonrheumatic aortic (valve) stenosis: Secondary | ICD-10-CM | POA: Insufficient documentation

## 2021-05-20 DIAGNOSIS — I2510 Atherosclerotic heart disease of native coronary artery without angina pectoris: Secondary | ICD-10-CM | POA: Diagnosis not present

## 2021-05-20 DIAGNOSIS — I119 Hypertensive heart disease without heart failure: Secondary | ICD-10-CM | POA: Insufficient documentation

## 2021-05-20 LAB — ECHOCARDIOGRAM COMPLETE
AR max vel: 1.61 cm2
AV Area VTI: 1.66 cm2
AV Area mean vel: 1.57 cm2
AV Mean grad: 12 mmHg
AV Peak grad: 23.2 mmHg
Ao pk vel: 2.41 m/s
Area-P 1/2: 3.48 cm2
Calc EF: 60.2 %
S' Lateral: 2.8 cm
Single Plane A2C EF: 56.9 %
Single Plane A4C EF: 61 %

## 2021-05-20 NOTE — Progress Notes (Signed)
?  Echocardiogram ?2D Echocardiogram has been performed. ? ?Bradley [name] ?05/20/2021, 10:54 AM ?

## 2021-05-25 ENCOUNTER — Encounter: Payer: Self-pay | Admitting: Internal Medicine

## 2021-05-25 ENCOUNTER — Telehealth: Payer: Self-pay

## 2021-05-25 NOTE — Telephone Encounter (Signed)
Spoke with the pt re: his Echo and he reports that his BP a few weeks ago was 200/160... he has not had it checked since then... he does have a cuff at home... his BP today at his surgeons office where he was having follow up form a recent back surgery was 160/?... his hr in the 60's according to him.... he has had headaches daily for a month.  ? ?He says he has been taking his meds... irbesartan 150 mg daily.  ? ? ?I offered him several appts and he declined  all of them..he says he rather reach out to his PCP to be seen since it is more convenient and he will be going out of town next week and does not want to commit to an appt... he declined ana ppt for tomorrow since he has to take his dog to [place] to be groomed.  ? ?I have asked that he monitor his BP and to let us know what is readings are on My Chart or call us and he says he will.  ? ? ?

## 2021-05-25 NOTE — Telephone Encounter (Signed)
-----   Message from Fay Records, MD sent at 05/25/2021  1:36 PM EDT ----- ?Pumping function of the heart is normal  ?AV remains stable   Mildly stenotic    ?Will continue to follow periodically  ?

## 2021-06-01 ENCOUNTER — Ambulatory Visit: Payer: Medicare Other | Admitting: Internal Medicine

## 2021-06-09 ENCOUNTER — Telehealth: Payer: Self-pay | Admitting: Internal Medicine

## 2021-06-09 NOTE — Telephone Encounter (Signed)
Pt has been scheduled for a BP f/u for 4/12 @ 1.20am ?

## 2021-06-09 NOTE — Telephone Encounter (Signed)
PT visits today in thinking he had an appointment. I believe the patient was getting the My Chart message interaction from 03/22 (regarding appointment on 06/01/2021) mixed up with an appointment being made for today. Had informed PT that he did not have an appointment with Dr.Jones today and that he had no more availability for today, but that another provider would be able to see him at 4:30. PT was not interested in seeing another provider or being scheduled for a follow up with Dr.Jones. PT was wanting this appointment made in regards to his blood pressure. ? ?CB: [phone] ?

## 2021-06-15 ENCOUNTER — Ambulatory Visit (INDEPENDENT_AMBULATORY_CARE_PROVIDER_SITE_OTHER): Payer: Medicare Other | Admitting: Internal Medicine

## 2021-06-15 ENCOUNTER — Encounter: Payer: Self-pay | Admitting: Internal Medicine

## 2021-06-15 VITALS — BP 152/96 | HR 69 | Temp 98.1°F | Resp 16 | Ht 70.0 in | Wt 229.0 lb

## 2021-06-15 DIAGNOSIS — E6609 Other obesity due to excess calories: Secondary | ICD-10-CM

## 2021-06-15 DIAGNOSIS — I10 Essential (primary) hypertension: Secondary | ICD-10-CM | POA: Diagnosis not present

## 2021-06-15 DIAGNOSIS — Z6832 Body mass index (BMI) 32.0-32.9, adult: Secondary | ICD-10-CM

## 2021-06-15 DIAGNOSIS — Z9884 Bariatric surgery status: Secondary | ICD-10-CM | POA: Diagnosis not present

## 2021-06-15 DIAGNOSIS — L987 Excessive and redundant skin and subcutaneous tissue: Secondary | ICD-10-CM

## 2021-06-15 LAB — CBC WITH DIFFERENTIAL/PLATELET
Basophils Absolute: 0 10*3/uL (ref 0.0–0.1)
Basophils Relative: 0.3 % (ref 0.0–3.0)
Eosinophils Absolute: 0.1 10*3/uL (ref 0.0–0.7)
Eosinophils Relative: 1.5 % (ref 0.0–5.0)
HCT: 42 % (ref 39.0–52.0)
Hemoglobin: 13.4 g/dL (ref 13.0–17.0)
Lymphocytes Relative: 44.4 % (ref 12.0–46.0)
Lymphs Abs: 2.3 10*3/uL (ref 0.7–4.0)
MCHC: 31.8 g/dL (ref 30.0–36.0)
MCV: 85.3 fl (ref 78.0–100.0)
Monocytes Absolute: 0.6 10*3/uL (ref 0.1–1.0)
Monocytes Relative: 10.9 % (ref 3.0–12.0)
Neutro Abs: 2.2 10*3/uL (ref 1.4–7.7)
Neutrophils Relative %: 42.9 % — ABNORMAL LOW (ref 43.0–77.0)
Platelets: 246 10*3/uL (ref 150.0–400.0)
RBC: 4.92 Mil/uL (ref 4.22–5.81)
RDW: 15.2 % (ref 11.5–15.5)
WBC: 5.2 10*3/uL (ref 4.0–10.5)

## 2021-06-15 LAB — BASIC METABOLIC PANEL
BUN: 10 mg/dL (ref 6–23)
CO2: 27 mEq/L (ref 19–32)
Calcium: 8.7 mg/dL (ref 8.4–10.5)
Chloride: 105 mEq/L (ref 96–112)
Creatinine, Ser: 0.89 mg/dL (ref 0.40–1.50)
GFR: 98.38 mL/min (ref >60.00)
Glucose, Bld: 80 mg/dL (ref 70–99)
Potassium: 4.7 mEq/L (ref 3.5–5.1)
Sodium: 137 mEq/L (ref 135–145)

## 2021-06-15 LAB — HEMOGLOBIN A1C: Hgb A1c MFr Bld: 5.6 % (ref 4.6–6.5)

## 2021-06-15 LAB — TSH: TSH: 2.56 u[IU]/mL (ref 0.35–5.50)

## 2021-06-15 MED ORDER — SEMAGLUTIDE-WEIGHT MANAGEMENT 1.7 MG/0.75ML ~~LOC~~ SOAJ: 1.7000 mg | SUBCUTANEOUS | 0 refills | Status: DC

## 2021-06-15 MED ORDER — SEMAGLUTIDE-WEIGHT MANAGEMENT 0.5 MG/0.5ML ~~LOC~~ SOAJ: 0.5000 mg | SUBCUTANEOUS | 0 refills | Status: DC

## 2021-06-15 MED ORDER — SEMAGLUTIDE-WEIGHT MANAGEMENT 1 MG/0.5ML ~~LOC~~ SOAJ: 1.0000 mg | SUBCUTANEOUS | 0 refills | Status: DC

## 2021-06-15 MED ORDER — IRBESARTAN 300 MG PO TABS: 300.0000 mg | ORAL_TABLET | Freq: Every day | ORAL | 0 refills | Status: DC

## 2021-06-15 MED ORDER — SEMAGLUTIDE-WEIGHT MANAGEMENT 0.25 MG/0.5ML ~~LOC~~ SOAJ: 0.2500 mg | SUBCUTANEOUS | 0 refills | Status: DC

## 2021-06-15 MED ORDER — INDAPAMIDE 1.25 MG PO TABS: 1.2500 mg | ORAL_TABLET | Freq: Every day | ORAL | 0 refills | Status: DC

## 2021-06-15 MED ORDER — SEMAGLUTIDE-WEIGHT MANAGEMENT 2.4 MG/0.75ML ~~LOC~~ SOAJ: 2.4000 mg | SUBCUTANEOUS | 0 refills | Status: DC

## 2021-06-15 NOTE — Progress Notes (Signed)
dund ? ?Subjective:  ?Patient ID: Bradley [name], male    DOB: 1970/11/23  Age: 51 y.o. MRN: [id number] ? ?CC: Hypertension ? ? ?HPI ?Bradley [name] presents for f/up - ? ?He complains that his BP has been high and he has had a few headaches recently.  He is taking the ARB. ? ?Outpatient Medications Prior to Visit  ?Medication Sig Dispense Refill  ? Buprenorphine HCl-Naloxone HCl 8-2 MG FILM Place 1 strip under the tongue 2 (two) times daily.    ? Cholecalciferol (VITAMIN D3) 50 MCG (2000 UT) TABS TAKE 2 TABLETS BY MOUTH DAILY 180 tablet 0  ? DODEX 1000 MCG/ML injection ADMINISTER 1 ML(1000 MCG) IN THE MUSCLE EVERY 30 DAYS 3 mL 0  ? Eszopiclone 3 MG TABS TAKE 1 TABLET(3 MG) BY MOUTH AT BEDTIME 90 tablet 0  ? Fe Fum-Fe Poly-Vit C-Lactobac (FUSION) 65-65-25-30 MG CAPS TAKE ONE CAPSULE BY MOUTH EVERY MORNING AND AT BEDTIME 180 capsule 1  ? ferrous sulfate 220 (44 Fe) MG/5ML solution Take 5 mLs (220 mg total) by mouth 3 (three) times daily with meals. 450 mL 1  ? fluocinonide-emollient (LIDEX-E) 0.05 % cream Apply 1 application topically 2 (two) times daily. 120 g 0  ? Fluticasone-Umeclidin-Vilant (TRELEGY ELLIPTA) 100-62.5-25 MCG/INH AEPB Inhale 1 puff into the lungs daily. 120 each 1  ? folic acid (FOLVITE) 1 MG tablet TAKE 1 TABLET(1 MG) BY MOUTH DAILY 90 tablet 1  ? gabapentin (NEURONTIN) 300 MG capsule Take 300 mg by mouth 3 (three) times daily.    ? hydrOXYzine (VISTARIL) 25 MG capsule Take 1 capsule (25 mg total) by mouth every 8 (eight) hours as needed. 30 capsule 0  ? imiquimod (ALDARA) 5 % cream Apply topically.    ? INS SYRINGE/NEEDLE 1CC/28G 28G X 1/2" 1 ML MISC Use to inject b12 monthly 10 each 1  ? Multiple Vitamin (MULTIVITAMIN) tablet Take 1 tablet by mouth daily.    ? Oxycodone HCl 10 MG TABS Take 10 mg by mouth 5 (five) times daily as needed.    ? sildenafil (REVATIO) 20 MG tablet Take 5 tablets by mouth as directed.    ? SYMBICORT 80-4.5 MCG/ACT inhaler Inhale 2 puffs into the lungs 2 (two) times  daily.    ? thiamine (VITAMIN B-1) 50 MG tablet TAKE 1 TABLET BY MOUTH DAILY 90 tablet 1  ? zinc gluconate 50 MG tablet TAKE 1 TABLET(50 MG) BY MOUTH DAILY 90 tablet 0  ? irbesartan (AVAPRO) 150 MG tablet TAKE 1 TABLET(150 MG) BY MOUTH DAILY 90 tablet 0  ? ?No facility-administered medications prior to visit.  ? ? ?ROS ?Review of Systems  ?Constitutional:  Positive for unexpected weight change (wt gain). Negative for chills, diaphoresis and fatigue.  ?HENT: Negative.    ?Eyes: Negative.   ?Respiratory:  Negative for cough, chest tightness, shortness of breath and wheezing.   ?Cardiovascular:  Negative for chest pain and leg swelling.  ?Gastrointestinal:  Negative for abdominal pain, blood in stool, constipation, diarrhea, nausea and vomiting.  ?Endocrine: Negative.   ?Genitourinary: Negative.  Negative for difficulty urinating.  ?Musculoskeletal:  Positive for arthralgias and back pain. Negative for myalgias.  ?Skin: Negative.  Negative for color change.  ?Neurological:  Positive for headaches. Negative for dizziness and weakness.  ?Hematological:  Negative for adenopathy. Does not bruise/bleed easily.  ?Psychiatric/Behavioral: Negative.    ? ?Objective:  ?BP (!) 152/96 (BP Location: Left Arm, Patient Position: Sitting, Cuff Size: Large)   Pulse 69   Temp 98.1 ?  F (36.7 ?C) (Oral)   Resp 16   Ht 5' 10"  (1.778 m)   Wt 229 lb (103.9 kg)   SpO2 97%   BMI 32.86 kg/m?  ? ?BP Readings from Last 3 Encounters:  ?06/15/21 (!) 152/96  ?03/02/21 124/86  ?02/15/21 140/80  ? ? ?Wt Readings from Last 3 Encounters:  ?06/15/21 229 lb (103.9 kg)  ?03/02/21 215 lb (97.5 kg)  ?02/15/21 219 lb (99.3 kg)  ? ? ?Physical Exam ?Vitals reviewed.  ?HENT:  ?   Mouth/Throat:  ?   Mouth: Mucous membranes are moist.  ?Eyes:  ?   General: No scleral icterus. ?   Conjunctiva/sclera: Conjunctivae normal.  ?Cardiovascular:  ?   Rate and Rhythm: Normal rate and regular rhythm.  ?   Heart sounds: Murmur heard.  ?Systolic murmur is present with a  grade of 1/6.  ?No diastolic murmur is present.  ?  No gallop.  ?Pulmonary:  ?   Effort: Pulmonary effort is normal.  ?   Breath sounds: No stridor. No wheezing, rhonchi or rales.  ?Abdominal:  ?   General: Abdomen is flat.  ?   Palpations: There is no mass.  ?   Tenderness: There is no abdominal tenderness. There is no guarding.  ?   Hernia: No hernia is present.  ?Musculoskeletal:  ?   Cervical back: Neck supple.  ?   Right lower leg: No edema.  ?   Left lower leg: No edema.  ?Lymphadenopathy:  ?   Cervical: No cervical adenopathy.  ?Skin: ?   General: Skin is warm and dry.  ?Neurological:  ?   General: No focal deficit present.  ?   Mental Status: He is alert. Mental status is at baseline.  ?Psychiatric:     ?   Mood and Affect: Mood normal.     ?   Behavior: Behavior normal.  ? ? ?Lab Results  ?Component Value Date  ? WBC 5.2 06/15/2021  ? HGB 13.4 06/15/2021  ? HCT 42.0 06/15/2021  ? PLT 246.0 06/15/2021  ? GLUCOSE 80 06/15/2021  ? CHOL 138 07/05/2020  ? TRIG 53 07/05/2020  ? HDL 55 07/05/2020  ? [name] 71 07/05/2020  ? ALT 24 07/05/2020  ? AST 18 07/05/2020  ? NA 137 06/15/2021  ? K 4.7 06/15/2021  ? CL 105 06/15/2021  ? CREATININE 0.89 06/15/2021  ? BUN 10 06/15/2021  ? CO2 27 06/15/2021  ? TSH 2.56 06/15/2021  ? PSA 0.6 07/05/2020  ? HGBA1C 5.6 06/15/2021  ? ? ?ECHOCARDIOGRAM COMPLETE ? ?Result Date: 05/20/2021 ?   ECHOCARDIOGRAM REPORT   Patient Name:   Bradley [name] Date of Exam: 05/20/2021 Medical Rec #:  [id number]       Height:       70.0 in Accession #:    [id number]      Weight:       215.0 lb Date of Birth:  [birth year]       BSA:          2.152 m? Patient Age:    49 years        BP:           145/93 mmHg Patient Gender: M               HR:           68 bpm. Exam Location:  Outpatient Procedure: 2D Echo, Cardiac Doppler and Color Doppler Indications:    Mild aortic stenosis [[id number]]  History:        Patient has prior history of Echocardiogram examinations, most                 recent 06/16/2019. CAD;  Risk Factors:Hypertension.  Sonographer:    Bernadene Person RDCS Referring Phys: [id number] PAULA V ROSS IMPRESSIONS  1. Normal LV function; calcified aortic valve with mild AS (mean gradient 13 mmHg and AVA 1.7 cm2).  2. Left ventricular ejection fraction, by estimation, is 60 to 65%. The left ventricle has normal function. The left ventricle has no regional wall motion abnormalities. There is mild concentric left ventricular hypertrophy. Left ventricular diastolic parameters are consistent with Grade II diastolic dysfunction (pseudonormalization).  3. Right ventricular systolic function is normal. The right ventricular size is normal.  4. Left atrial size was moderately dilated.  5. The mitral valve is normal in structure. No evidence of mitral valve regurgitation. No evidence of mitral stenosis.  6. The aortic valve is tricuspid. Aortic valve regurgitation is not visualized. Mild aortic valve stenosis.  7. The inferior vena cava is normal in size with greater than 50% respiratory variability, suggesting right atrial pressure of 3 mmHg. FINDINGS  Left Ventricle: Left ventricular ejection fraction, by estimation, is 60 to 65%. The left ventricle has normal function. The left ventricle has no regional wall motion abnormalities. The left ventricular internal cavity size was normal in size. There is  mild concentric left ventricular hypertrophy. Left ventricular diastolic parameters are consistent with Grade II diastolic dysfunction (pseudonormalization). Right Ventricle: The right ventricular size is normal. Right ventricular systolic function is normal. Left Atrium: Left atrial size was moderately dilated. Right Atrium: Right atrial size was normal in size. Pericardium: There is no evidence of pericardial effusion. Mitral Valve: The mitral valve is normal in structure. Mild mitral annular calcification. No evidence of mitral valve regurgitation. No evidence of mitral valve stenosis. Tricuspid Valve: The tricuspid valve is  normal in structure. Tricuspid valve regurgitation is trivial. No evidence of tricuspid stenosis. Aortic Valve: The aortic valve is tricuspid. Aortic valve regurgitation is not visualized. Mild aortic stenosis is

## 2021-06-15 NOTE — Patient Instructions (Signed)

## 2021-06-16 ENCOUNTER — Other Ambulatory Visit: Payer: Self-pay | Admitting: Internal Medicine

## 2021-06-16 DIAGNOSIS — E559 Vitamin D deficiency, unspecified: Secondary | ICD-10-CM

## 2021-06-16 DIAGNOSIS — L987 Excessive and redundant skin and subcutaneous tissue: Secondary | ICD-10-CM | POA: Insufficient documentation

## 2021-06-16 DIAGNOSIS — Z9884 Bariatric surgery status: Secondary | ICD-10-CM

## 2021-06-16 MED ORDER — SAXENDA 18 MG/3ML ~~LOC~~ SOPN: 3.0000 mg | PEN_INJECTOR | Freq: Every day | SUBCUTANEOUS | 1 refills | Status: DC

## 2021-06-16 MED ORDER — INSULIN PEN NEEDLE 32G X 6 MM MISC: 1.0000 | Freq: Every day | 1 refills | Status: DC

## 2021-07-06 ENCOUNTER — Other Ambulatory Visit: Payer: Self-pay | Admitting: Internal Medicine

## 2021-07-06 DIAGNOSIS — D528 Other folate deficiency anemias: Secondary | ICD-10-CM

## 2021-07-08 ENCOUNTER — Ambulatory Visit: Payer: Medicare Other | Admitting: Plastic Surgery

## 2021-07-08 ENCOUNTER — Encounter: Payer: Self-pay | Admitting: Oncology

## 2021-07-08 VITALS — BP 111/72 | HR 86 | Ht 70.0 in | Wt 228.6 lb

## 2021-07-08 DIAGNOSIS — L989 Disorder of the skin and subcutaneous tissue, unspecified: Secondary | ICD-10-CM | POA: Diagnosis not present

## 2021-07-10 NOTE — Progress Notes (Signed)
? ?Referring Provider ?[name], MD ?[place],  [place] [address]  ? ?CC:  ?Rashes medial thighs ? ?Bradley [name] is an 51 y.o. male.  ?HPI: Patient is a 51 year old who recently lost a lot of weight.  He is lost over 264 pounds total.  He has concerns about a number of areas.  His medial thighs are his most bothersome where he has frequent rashes.  He also notes that he has excess skin of his chest tissue in the breast area.  He is interested in cosmetic improvement of this.  He is also in interested in cosmetic improvement of his arms.  For his abdomen he would like the suprapubic area reduced as well as some tissue taken laterally. ? ?Allergies  ?Allergen Reactions  ? Codeine Hives and Itching  ? ? ?Outpatient Encounter Medications as of 07/08/2021  ?Medication Sig  ? Buprenorphine HCl-Naloxone HCl 8-2 MG FILM Place 1 strip under the tongue 2 (two) times daily.  ? Cholecalciferol (VITAMIN D3) 50 MCG (2000 UT) TABS TAKE 2 TABLETS BY MOUTH DAILY  ? DODEX 1000 MCG/ML injection ADMINISTER 1 ML(1000 MCG) IN THE MUSCLE EVERY 30 DAYS  ? Eszopiclone 3 MG TABS TAKE 1 TABLET(3 MG) BY MOUTH AT BEDTIME  ? Fe Fum-Fe Poly-Vit C-Lactobac (FUSION) 65-65-25-30 MG CAPS TAKE ONE CAPSULE BY MOUTH EVERY MORNING AND AT BEDTIME  ? ferrous sulfate 220 (44 Fe) MG/5ML solution Take 5 mLs (220 mg total) by mouth 3 (three) times daily with meals.  ? fluocinonide-emollient (LIDEX-E) 0.05 % cream Apply 1 application topically 2 (two) times daily.  ? Fluticasone-Umeclidin-Vilant (TRELEGY ELLIPTA) 100-62.5-25 MCG/INH AEPB Inhale 1 puff into the lungs daily.  ? folic acid (FOLVITE) 1 MG tablet TAKE 1 TABLET(1 MG) BY MOUTH DAILY  ? gabapentin (NEURONTIN) 300 MG capsule Take 300 mg by mouth 3 (three) times daily.  ? hydrOXYzine (VISTARIL) 25 MG capsule Take 1 capsule (25 mg total) by mouth every 8 (eight) hours as needed.  ? imiquimod (ALDARA) 5 % cream Apply topically.  ? indapamide (LOZOL) 1.25 MG tablet Take 1 tablet  (1.25 mg total) by mouth daily.  ? INS SYRINGE/NEEDLE 1CC/28G 28G X 1/2" 1 ML MISC Use to inject b12 monthly  ? Insulin Pen Needle 32G X 6 MM MISC 1 Act by Does not apply route daily.  ? irbesartan (AVAPRO) 300 MG tablet Take 1 tablet (300 mg total) by mouth daily.  ? Liraglutide -Weight Management (SAXENDA) 18 MG/3ML SOPN Inject 3 mg into the skin daily.  ? Multiple Vitamin (MULTIVITAMIN) tablet Take 1 tablet by mouth daily.  ? Oxycodone HCl 10 MG TABS Take 10 mg by mouth 5 (five) times daily as needed.  ? sildenafil (REVATIO) 20 MG tablet Take 5 tablets by mouth as directed.  ? SYMBICORT 80-4.5 MCG/ACT inhaler Inhale 2 puffs into the lungs 2 (two) times daily.  ? thiamine (VITAMIN B-1) 50 MG tablet TAKE 1 TABLET BY MOUTH DAILY  ? zinc gluconate 50 MG tablet TAKE 1 TABLET(50 MG) BY MOUTH DAILY  ? ?No facility-administered encounter medications on file as of 07/08/2021.  ?  ? ?Past Medical History:  ?Diagnosis Date  ? Allergy   ? Anemia   ? since gastric bypass  ? Anxiety   ? Aortic valve sclerosis   ? Arthritis   ? Arthropathy, unspecified, other specified sites   ? CAD (coronary artery disease)   ? 01/09/20: patient denied; faint coronary calcifications on 2016 CT  ? Contact with  or exposure to venereal diseases   ? Depression   ? Displacement of intervertebral disc, site unspecified, without myelopathy   ? GERD (gastroesophageal reflux disease)   ? Heart murmur   ? mild MR, mild AS 06/2019 (Dr. [name])  ? History of kidney stones   ? Hypertension   ?  " no longer have hypertension since gastric bypass "  ? Lumbago   ? Obesity, unspecified   ? Other, mixed, or unspecified nondependent drug abuse, unspecified   ? Personal history of urinary calculi   ? Pneumonia   ? Priapism   ? Stenosis, spinal, lumbar   ? ? ?Past Surgical History:  ?Procedure Laterality Date  ? ABDOMINAL SURGERY    ? ANKLE SURGERY    ? ANTERIOR LAT LUMBAR FUSION Left 02/24/2016  ? Procedure: LEFT LUMBAR TWO-THREE  ANTERIOR LATERAL LUMBAR FUSION   ;  Surgeon: Earnie Larsson, MD;  Location: [facility];  Service: Neurosurgery;  Laterality: Left;  ? ANTERIOR LATERAL LUMBAR FUSION WITH PERCUTANEOUS SCREW 1 LEVEL Right 01/13/2020  ? Procedure: RIGHT LUMBAR ONE-TWO ANTERIOR LATERAL LUMBAR FUSION WITH PERCUTANEOUS SCREW PLACEMENT;  Surgeon: Earnie Larsson, MD;  Location: [facility];  Service: Neurosurgery;  Laterality: Right;  3C  ? BACK SURGERY    ? GASTRIC BYPASS    ? GASTRIC BYPASS  2008  ? HERNIA REPAIR    ? INCISION AND DRAINAGE ABSCESS Right 07/06/2004  ? great toe  ? IRRIGATION AND DEBRIDEMENT ABSCESS Right 07/08/2004  ? great toe  ? KNEE ARTHROPLASTY Right 04/26/2017  ? Procedure: RIGHT TOTAL KNEE ARTHROPLASTY WITH COMPUTER NAVIGATION;  Surgeon: Rod Can, MD;  Location: [facility];  Service: Orthopedics;  Laterality: Right;  Needs RNFA  ? KNEE SURGERY    ? LUMBAR PERCUTANEOUS PEDICLE SCREW 1 LEVEL Left 02/24/2016  ? Procedure: LUMBAR PERCUTANEOUS PEDICLE SCREW 1 LEVEL;  Surgeon: Earnie Larsson, MD;  Location: [facility];  Service: Neurosurgery;  Laterality: Left;  ? MICRODISCECTOMY LUMBAR Left 08/24/2008  ? T12 - L1  ? panectomy    ? TOTAL KNEE ARTHROPLASTY Right 04/2017  ? ? ?Family History  ?Problem Relation Age of Onset  ? Arthritis Mother   ? Colon cancer Mother   ? Breast cancer Mother   ?     twice  ? Kidney failure Mother   ? Heart disease Father   ? Hypertension Father   ? Esophageal cancer Neg Hx   ? Inflammatory bowel disease Neg Hx   ? Liver disease Neg Hx   ? Pancreatic cancer Neg Hx   ? Stomach cancer Neg Hx   ? Rectal cancer Neg Hx   ? ? ?Social History  ? ?Social History Narrative  ? Not on file  ?  ? ?Review of Systems ?General: Denies fevers, chills, weight loss ?CV: Denies chest pain, shortness of breath, palpitations ? ? ?Physical Exam ? ?  07/08/2021  ?  3:16 PM 06/15/2021  ? 11:18 AM 03/02/2021  ?  2:05 PM  ?Vitals with BMI  ?Height 5' 10"  5' 10"  5' 10"   ?Weight 228 lbs 10 oz 229 lbs 215 lbs  ?BMI 32.8 32.86 30.85  ?Systolic 389 373 428  ?Diastolic 72 96 86  ?Pulse 86  69 66  ?  ?General:  No acute distress,  Alert and oriented, Non-Toxic, Normal speech and affect ?Chest: Patient does not have a male shape breast but does have significant excess skin of his chest with nipple ptosis ?Abdomen: Significant excess skin in the suprapubic area  as well as some excess skin laterally. ?Extremities: Medial thighs with significant excess skin as well as rashes visible on exam today.  Arms have significant excess skin ? ?Assessment/Plan ?1) medial thighs.  Patient would like medial thigh lift.  He may have some functional benefit of this in terms of improvement of rashes. ?2) abdomen: Patient had a previous abdominoplasty.  Revision with primarily excision of suprapubic area and lateral tissue would offer some cosmetic improvement. ?3) arms: Patient could have cosmetic improvement from brachioplasty. ?4) breast: Patient is a good candidate for gynecomastia procedure with free nipple graft. ? ?Lennice Sites ?07/10/2021, 1:52 PM  ? ? ?  ?

## 2021-07-18 DIAGNOSIS — M5459 Other low back pain: Secondary | ICD-10-CM | POA: Diagnosis not present

## 2021-07-18 DIAGNOSIS — M961 Postlaminectomy syndrome, not elsewhere classified: Secondary | ICD-10-CM | POA: Diagnosis not present

## 2021-07-18 DIAGNOSIS — G894 Chronic pain syndrome: Secondary | ICD-10-CM | POA: Diagnosis not present

## 2021-07-18 DIAGNOSIS — Z79891 Long term (current) use of opiate analgesic: Secondary | ICD-10-CM | POA: Diagnosis not present

## 2021-07-27 DIAGNOSIS — M5416 Radiculopathy, lumbar region: Secondary | ICD-10-CM | POA: Diagnosis not present

## 2021-08-04 ENCOUNTER — Other Ambulatory Visit: Payer: Self-pay | Admitting: Internal Medicine

## 2021-08-04 DIAGNOSIS — I10 Essential (primary) hypertension: Secondary | ICD-10-CM

## 2021-08-19 DIAGNOSIS — M13842 Other specified arthritis, left hand: Secondary | ICD-10-CM | POA: Diagnosis not present

## 2021-08-19 DIAGNOSIS — M13841 Other specified arthritis, right hand: Secondary | ICD-10-CM | POA: Diagnosis not present

## 2021-09-15 ENCOUNTER — Other Ambulatory Visit: Payer: Self-pay | Admitting: Internal Medicine

## 2021-09-15 DIAGNOSIS — G8929 Other chronic pain: Secondary | ICD-10-CM

## 2021-09-19 ENCOUNTER — Other Ambulatory Visit: Payer: Self-pay | Admitting: Internal Medicine

## 2021-09-19 ENCOUNTER — Encounter: Payer: Self-pay | Admitting: Internal Medicine

## 2021-09-19 DIAGNOSIS — I10 Essential (primary) hypertension: Secondary | ICD-10-CM

## 2021-09-19 DIAGNOSIS — G8929 Other chronic pain: Secondary | ICD-10-CM

## 2021-09-19 MED ORDER — INDAPAMIDE 1.25 MG PO TABS: 1.2500 mg | ORAL_TABLET | Freq: Every day | ORAL | 0 refills | Status: DC

## 2021-09-19 MED ORDER — ESZOPICLONE 3 MG PO TABS: 3.0000 mg | ORAL_TABLET | Freq: Every day | ORAL | 0 refills | Status: DC

## 2021-09-19 MED ORDER — IRBESARTAN 300 MG PO TABS: 300.0000 mg | ORAL_TABLET | Freq: Every day | ORAL | 0 refills | Status: DC

## 2021-10-17 DIAGNOSIS — Z79899 Other long term (current) drug therapy: Secondary | ICD-10-CM | POA: Diagnosis not present

## 2021-10-17 DIAGNOSIS — Z5181 Encounter for therapeutic drug level monitoring: Secondary | ICD-10-CM | POA: Diagnosis not present

## 2021-10-17 DIAGNOSIS — G894 Chronic pain syndrome: Secondary | ICD-10-CM | POA: Diagnosis not present

## 2021-10-17 DIAGNOSIS — M961 Postlaminectomy syndrome, not elsewhere classified: Secondary | ICD-10-CM | POA: Diagnosis not present

## 2021-10-17 DIAGNOSIS — M5416 Radiculopathy, lumbar region: Secondary | ICD-10-CM | POA: Diagnosis not present

## 2021-11-03 ENCOUNTER — Other Ambulatory Visit: Payer: Self-pay | Admitting: Internal Medicine

## 2021-11-03 DIAGNOSIS — D508 Other iron deficiency anemias: Secondary | ICD-10-CM

## 2021-11-08 DIAGNOSIS — M13841 Other specified arthritis, right hand: Secondary | ICD-10-CM | POA: Diagnosis not present

## 2021-11-08 DIAGNOSIS — M13842 Other specified arthritis, left hand: Secondary | ICD-10-CM | POA: Diagnosis not present

## 2021-11-30 DIAGNOSIS — M5416 Radiculopathy, lumbar region: Secondary | ICD-10-CM | POA: Diagnosis not present

## 2021-12-01 ENCOUNTER — Ambulatory Visit (INDEPENDENT_AMBULATORY_CARE_PROVIDER_SITE_OTHER): Payer: Medicare Other | Admitting: *Deleted

## 2021-12-01 DIAGNOSIS — Z0000 Encounter for general adult medical examination without abnormal findings: Secondary | ICD-10-CM

## 2021-12-01 NOTE — Patient Instructions (Signed)
Health Maintenance, Male Adopting a healthy lifestyle and getting preventive care are important in promoting health and wellness. Ask your health care provider about: The right schedule for you to have regular tests and exams. Things you can do on your own to prevent diseases and keep yourself healthy. What should I know about diet, weight, and exercise? Eat a healthy diet  Eat a diet that includes plenty of vegetables, fruits, low-fat dairy products, and lean protein. Do not eat a lot of foods that are high in solid fats, added sugars, or sodium. Maintain a healthy weight Body mass index (BMI) is a measurement that can be used to identify possible weight problems. It estimates body fat based on height and weight. Your health care provider can help determine your BMI and help you achieve or maintain a healthy weight. Get regular exercise Get regular exercise. This is one of the most important things you can do for your health. Most adults should: Exercise for at least 150 minutes each week. The exercise should increase your heart rate and make you sweat (moderate-intensity exercise). Do strengthening exercises at least twice a week. This is in addition to the moderate-intensity exercise. Spend less time sitting. Even light physical activity can be beneficial. Watch cholesterol and blood lipids Have your blood tested for lipids and cholesterol at 53 years of age, then have this test every 5 years. You may need to have your cholesterol levels checked more often if: Your lipid or cholesterol levels are high. You are older than 53 years of age. You are at high risk for heart disease. What should I know about cancer screening? Many types of cancers can be detected early and may often be prevented. Depending on your health history and family history, you may need to have cancer screening at various ages. This may include screening for: Colorectal cancer. Prostate cancer. Skin cancer. Lung  cancer. What should I know about heart disease, diabetes, and high blood pressure? Blood pressure and heart disease High blood pressure causes heart disease and increases the risk of stroke. This is more likely to develop in people who have high blood pressure readings or are overweight. Talk with your health care provider about your target blood pressure readings. Have your blood pressure checked: Every 3-5 years if you are 18-39 years of age. Every year if you are 40 years old or older. If you are between the ages of 65 and 75 and are a current or former smoker, ask your health care provider if you should have a one-time screening for abdominal aortic aneurysm (AAA). Diabetes Have regular diabetes screenings. This checks your fasting blood sugar level. Have the screening done: Once every three years after age 45 if you are at a normal weight and have a low risk for diabetes. More often and at a younger age if you are overweight or have a high risk for diabetes. What should I know about preventing infection? Hepatitis B If you have a higher risk for hepatitis B, you should be screened for this virus. Talk with your health care provider to find out if you are at risk for hepatitis B infection. Hepatitis C Blood testing is recommended for: Everyone born from 1945 through 1965. Anyone with known risk factors for hepatitis C. Sexually transmitted infections (STIs) You should be screened each year for STIs, including gonorrhea and chlamydia, if: You are sexually active and are younger than 53 years of age. You are older than 53 years of age and your   health care provider tells you that you are at risk for this type of infection. Your sexual activity has changed since you were last screened, and you are at increased risk for chlamydia or gonorrhea. Ask your health care provider if you are at risk. Ask your health care provider about whether you are at high risk for HIV. Your health care provider  may recommend a prescription medicine to help prevent HIV infection. If you choose to take medicine to prevent HIV, you should first get tested for HIV. You should then be tested every 3 months for as long as you are taking the medicine. Follow these instructions at home: Alcohol use Do not drink alcohol if your health care provider tells you not to drink. If you drink alcohol: Limit how much you have to 0-2 drinks a day. Know how much alcohol is in your drink. In the [place] one drink equals one 12 oz bottle of beer (355 mL), one 5 oz glass of Kately Graffam (148 mL), or one 1 oz glass of hard liquor (44 mL). Lifestyle Do not use any products that contain nicotine or tobacco. These products include cigarettes, chewing tobacco, and vaping devices, such as e-cigarettes. If you need help quitting, ask your health care provider. Do not use street drugs. Do not share needles. Ask your health care provider for help if you need support or information about quitting drugs. General instructions Schedule regular health, dental, and eye exams. Stay current with your vaccines. Tell your health care provider if: You often feel depressed. You have ever been abused or do not feel safe at home. Summary Adopting a healthy lifestyle and getting preventive care are important in promoting health and wellness. Follow your health care provider's instructions about healthy diet, exercising, and getting tested or screened for diseases. Follow your health care provider's instructions on monitoring your cholesterol and blood pressure. This information is not intended to replace advice given to you by your health care provider. Make sure you discuss any questions you have with your health care provider. Document Revised: 07/12/2020 Document Reviewed: 07/12/2020 Elsevier Patient Education  2023 [employer]  

## 2021-12-01 NOTE — Progress Notes (Signed)
Subjective:   Bradley [name] is a 51 y.o. male who presents for an Initial Medicare Annual Wellness Visit. I connected with  Otho Perl on 12/01/21 by a audio enabled telemedicine application and verified that I am speaking with the correct person using two identifiers.  Patient Location: Home  Provider Location: Home Office  I discussed the limitations of evaluation and management by telemedicine. The patient expressed understanding and agreed to proceed.  Review of Systems    Deferred to PCP Cardiac Risk Factors include: dyslipidemia;male gender;hypertension;obesity (BMI >30kg/m2)     Objective:    Today's Vitals   12/01/21 1236  PainSc: 5    There is no height or weight on file to calculate BMI.     12/01/2021   12:50 PM 01/13/2020    8:58 AM 01/09/2020   10:08 AM 04/26/2017   12:15 PM 04/20/2017    2:42 PM 02/24/2016    9:00 PM 02/24/2016    8:00 PM  Advanced Directives  Does Patient Have a Medical Advance Directive? Yes Yes Yes Yes Yes  Yes  Type of Paramedic of [place];Living will [place];Living will [place];Living will Living will Living will  [place];Living will  Does patient want to make changes to medical advance directive? No - Patient declined   No - Patient declined No - Patient declined No - Patient declined   Copy of [place] in Chart? No - copy requested No - copy requested     Yes    Current Medications (verified) Outpatient Encounter Medications as of 12/01/2021  Medication Sig   Cholecalciferol (VITAMIN D3) 50 MCG (2000 UT) TABS TAKE 2 TABLETS BY MOUTH DAILY   DODEX 1000 MCG/ML injection ADMINISTER 1 ML(1000 MCG) IN THE MUSCLE EVERY 30 DAYS   Fe Fum-Fe Poly-Vit C-Lactobac (FUSION) 65-65-25-30 MG CAPS TAKE ONE CAPSULE BY MOUTH EVERY MORNING AND AT BEDTIME   ferrous sulfate 220 (44 Fe) MG/5ML solution Take 5 mLs (220 mg total) by mouth 3  (three) times daily with meals.   fluocinonide-emollient (LIDEX-E) 0.05 % cream Apply 1 application topically 2 (two) times daily.   Fluticasone-Umeclidin-Vilant (TRELEGY ELLIPTA) 100-62.5-25 MCG/INH AEPB Inhale 1 puff into the lungs daily.   folic acid (FOLVITE) 1 MG tablet TAKE 1 TABLET(1 MG) BY MOUTH DAILY   gabapentin (NEURONTIN) 300 MG capsule Take 300 mg by mouth 3 (three) times daily.   hydrOXYzine (VISTARIL) 25 MG capsule Take 1 capsule (25 mg total) by mouth every 8 (eight) hours as needed.   imiquimod (ALDARA) 5 % cream Apply topically.   indapamide (LOZOL) 1.25 MG tablet Take 1 tablet (1.25 mg total) by mouth daily.   INS SYRINGE/NEEDLE 1CC/28G 28G X 1/2" 1 ML MISC Use to inject b12 monthly   Insulin Pen Needle 32G X 6 MM MISC 1 Act by Does not apply route daily.   irbesartan (AVAPRO) 300 MG tablet Take 1 tablet (300 mg total) by mouth daily.   Liraglutide -Weight Management (SAXENDA) 18 MG/3ML SOPN Inject 3 mg into the skin daily.   Multiple Vitamin (MULTIVITAMIN) tablet Take 1 tablet by mouth daily.   Oxycodone HCl 10 MG TABS Take 10 mg by mouth 5 (five) times daily as needed.   sildenafil (REVATIO) 20 MG tablet Take 5 tablets by mouth as directed.   SYMBICORT 80-4.5 MCG/ACT inhaler Inhale 2 puffs into the lungs 2 (two) times daily.   thiamine (VITAMIN B-1) 50 MG  tablet TAKE 1 TABLET BY MOUTH DAILY   Buprenorphine HCl-Naloxone HCl 8-2 MG FILM Place 1 strip under the tongue 2 (two) times daily. (Patient not taking: Reported on 12/01/2021)   Eszopiclone 3 MG TABS Take 1 tablet (3 mg total) by mouth at bedtime. Take immediately before bedtime (Patient not taking: Reported on 12/01/2021)   zinc gluconate 50 MG tablet TAKE 1 TABLET(50 MG) BY MOUTH DAILY (Patient not taking: Reported on 12/01/2021)   No facility-administered encounter medications on file as of 12/01/2021.    Allergies (verified) Codeine   History: Past Medical History:  Diagnosis Date   Allergy    Anemia    since  gastric bypass   Anxiety    Aortic valve sclerosis    Arthritis    Arthropathy, unspecified, other specified sites    CAD (coronary artery disease)    01/09/20: patient denied; faint coronary calcifications on 2016 CT   Contact with or exposure to venereal diseases    Depression    Displacement of intervertebral disc, site unspecified, without myelopathy    GERD (gastroesophageal reflux disease)    Heart murmur    mild MR, mild AS 06/2019 (Dr. [name])   History of kidney stones    Hypertension     " no longer have hypertension since gastric bypass "   Lumbago    Obesity, unspecified    Other, mixed, or unspecified nondependent drug abuse, unspecified    Personal history of urinary calculi    Pneumonia    Priapism    Stenosis, spinal, lumbar    Past Surgical History:  Procedure Laterality Date   ABDOMINAL SURGERY     ANKLE SURGERY     ANTERIOR LAT LUMBAR FUSION Left 02/24/2016   Procedure: LEFT LUMBAR TWO-THREE  ANTERIOR LATERAL LUMBAR FUSION  ;  Surgeon: Earnie Larsson, MD;  Location: [facility];  Service: Neurosurgery;  Laterality: Left;   ANTERIOR LATERAL LUMBAR FUSION WITH PERCUTANEOUS SCREW 1 LEVEL Right 01/13/2020   Procedure: RIGHT LUMBAR ONE-TWO ANTERIOR LATERAL LUMBAR FUSION WITH PERCUTANEOUS SCREW PLACEMENT;  Surgeon: Earnie Larsson, MD;  Location: [facility];  Service: Neurosurgery;  Laterality: Right;  3C   BACK SURGERY     GASTRIC BYPASS     GASTRIC BYPASS  2008   HERNIA REPAIR     INCISION AND DRAINAGE ABSCESS Right 07/06/2004   great toe   IRRIGATION AND DEBRIDEMENT ABSCESS Right 07/08/2004   great toe   KNEE ARTHROPLASTY Right 04/26/2017   Procedure: RIGHT TOTAL KNEE ARTHROPLASTY WITH COMPUTER NAVIGATION;  Surgeon: Rod Can, MD;  Location: [facility];  Service: Orthopedics;  Laterality: Right;  Needs RNFA   KNEE SURGERY     LUMBAR PERCUTANEOUS PEDICLE SCREW 1 LEVEL Left 02/24/2016   Procedure: LUMBAR PERCUTANEOUS PEDICLE SCREW 1 LEVEL;  Surgeon: Earnie Larsson, MD;  Location: [facility];  Service: Neurosurgery;  Laterality: Left;   MICRODISCECTOMY LUMBAR Left 08/24/2008   T12 - L1   panectomy     TOTAL KNEE ARTHROPLASTY Right 04/2017   Family History  Problem Relation Age of Onset   Arthritis Mother    Colon cancer Mother    Breast cancer Mother        twice   Kidney failure Mother    Heart disease Father    Hypertension Father    Esophageal cancer Neg Hx    Inflammatory bowel disease Neg Hx    Liver disease Neg Hx    Pancreatic cancer Neg Hx    Stomach cancer  Neg Hx    Rectal cancer Neg Hx    Social History   Socioeconomic History   Marital status: Married    Spouse name: Not on file   Number of children: 3   Years of education: high school   Highest education level: Not on file  Occupational History   Occupation: disabled  Tobacco Use   Smoking status: Never   Smokeless tobacco: Never  Vaping Use   Vaping Use: Never used  Substance and Sexual Activity   Alcohol use: No   Drug use: No   Sexual activity: Yes  Other Topics Concern   Not on file  Social History Narrative   Not on file   Social Determinants of Health   Financial Resource Strain: High Risk (12/01/2021)   Overall Financial Resource Strain (CARDIA)    Difficulty of Paying Living Expenses: Very hard  Food Insecurity: No Food Insecurity (12/01/2021)   Hunger Vital Sign    Worried About Running Out of Food in the Last Year: Never true    Ran Out of Food in the Last Year: Never true  Transportation Needs: No Transportation Needs (12/01/2021)   PRAPARE - Hydrologist (Medical): No    Lack of Transportation (Non-Medical): No  Physical Activity: Inactive (12/01/2021)   Exercise Vital Sign    Days of Exercise per Week: 0 days    Minutes of Exercise per Session: 0 min  Stress: No Stress Concern Present (12/01/2021)   [place]    Feeling of Stress : Only a little  Social Connections:  Socially Integrated (12/01/2021)   Social Connection and Isolation Panel [NHANES]    Frequency of Communication with Friends and Family: More than three times a week    Frequency of Social Gatherings with Friends and Family: More than three times a week    Attends Religious Services: More than 4 times per year    Active Member of [employer] or Organizations: Yes    Attends Music therapist: More than 4 times per year    Marital Status: Married    Tobacco Counseling Counseling given: Not Answered   Clinical Intake:  Pre-visit preparation completed: Yes  Pain : 0-10 Pain Score: 5  Pain Type: Chronic pain Pain Location: Back (hip) Pain Descriptors / Indicators: Aching, Discomfort, Dull, Numbness     Nutritional Status: BMI > 30  Obese Nutritional Risks: None Diabetes: No  How often do you need to have someone help you when you read instructions, pamphlets, or other written materials from your doctor or pharmacy?: 1 - Never What is the last grade level you completed in school?: high school  Diabetic?No  Interpreter Needed?: No  Information entered by :: Emelia Loron RN   Activities of Daily Living    12/01/2021   12:51 PM  In your present state of health, do you have any difficulty performing the following activities:  Hearing? 0  Vision? 0  Difficulty concentrating or making decisions? 0  Walking or climbing stairs? 0  Dressing or bathing? 0  Doing errands, shopping? 0  Preparing Food and eating ? N  Using the Toilet? N  In the past six months, have you accidently leaked urine? N  Do you have problems with loss of bowel control? N  Managing your Medications? N  Managing your Finances? N  Housekeeping or managing your Housekeeping? N    Patient Care Team: [name], MD as  PCP - General (Internal Medicine) Charlton Haws, [facility] as Pharmacist (Pharmacist)  Indicate any recent Medical Services you may have received from other than [facility] providers  in the past year (date may be approximate).     Assessment:   This is a routine wellness examination for [place].  Hearing/Vision screen No results found.  Dietary issues and exercise activities discussed: Current Exercise Habits: The patient does not participate in regular exercise at present, Exercise limited by: orthopedic condition(s)   Goals Addressed             This Visit's Progress    Patient Stated       Maintain my current health status and stay as independent as possible.      Depression Screen    12/01/2021   12:43 PM 02/02/2020    3:09 PM 10/25/2017    4:02 PM 09/05/2016    3:27 PM 02/01/2015    6:47 PM 01/27/2015    4:29 PM 01/20/2015    3:19 PM  PHQ 2/9 Scores  PHQ - 2 Score 0 0 0 1 0 0 0  PHQ- 9 Score   6 2       Fall Risk    12/01/2021   12:48 PM  [place] in the past year? 0  Number falls in past yr: 0  Injury with Fall? 0  Risk for fall due to : History of fall(s);Impaired balance/gait;Impaired mobility  Follow up Falls evaluation completed    FALL RISK PREVENTION PERTAINING TO THE HOME:  Any stairs in or around the home? Yes  If so, are there any without handrails? Yes  Home free of loose throw rugs in walkways, pet beds, electrical cords, etc? Yes  Adequate lighting in your home to reduce risk of falls? Yes   ASSISTIVE DEVICES UTILIZED TO PREVENT FALLS:  Life alert? No  Use of a cane, walker or w/c? Yes  Grab bars in the bathroom? Yes  Shower chair or bench in shower? Yes  Elevated toilet seat or a handicapped toilet? Yes   Cognitive Function:        12/01/2021   12:46 PM  6CIT Screen  What Year? 0 points  What time? 0 points  Count back from 20 0 points  Months in reverse 0 points  Repeat phrase 0 points    Immunizations Immunization History  Administered Date(s) Administered   Hep A / Hep B 09/26/2016, 10/27/2016, 01/29/2017   Influenza,inj,Quad PF,6+ Mos 12/07/2017, 04/22/2019   Moderna Sars-Covid-2  Vaccination 10/26/2019, 11/29/2019   Pneumococcal Conjugate-13 07/25/2012   Tdap 08/28/2007, 09/05/2016    TDAP status: Up to date  Flu Vaccine status: Due, Education has been provided regarding the importance of this vaccine. Advised may receive this vaccine at local pharmacy or Health Dept. Aware to provide a copy of the vaccination record if obtained from local pharmacy or Health Dept. Verbalized acceptance and understanding.  Covid-19 vaccine status: Information provided on how to obtain vaccines.   Qualifies for Shingles Vaccine? Yes   Zostavax completed No   Shingrix Completed?: No.    Education has been provided regarding the importance of this vaccine. Patient has been advised to call insurance company to determine out of pocket expense if they have not yet received this vaccine. Advised may also receive vaccine at local pharmacy or Health Dept. Verbalized acceptance and understanding.  Screening Tests Health Maintenance  Topic Date Due   HIV Screening  Never done   Zoster Vaccines-  Shingrix (1 of 2) Never done   COVID-19 Vaccine (3 - Moderna risk series) 12/27/2019   INFLUENZA VACCINE  06/04/2022 (Originally 10/04/2021)   TETANUS/TDAP  09/06/2026   COLONOSCOPY (Pts 45-70yr Insurance coverage will need to be confirmed)  05/03/2028   Hepatitis C Screening  Completed   HPV VACCINES  Aged Out    Health Maintenance  Health Maintenance Due  Topic Date Due   HIV Screening  Never done   Zoster Vaccines- Shingrix (1 of 2) Never done   COVID-19 Vaccine (3 - Moderna risk series) 12/27/2019    Colorectal cancer screening: Type of screening: Colonoscopy. Completed 05/03/18. Repeat every 10 years  Lung Cancer Screening: (Low Dose CT Chest recommended if Age 53-80years, 30 pack-year currently smoking OR have quit w/in 15years.) does not qualify.   Additional Screening:  Hepatitis C Screening: does qualify; Completed 09/05/16  Vision Screening: Recommended annual ophthalmology  exams for early detection of glaucoma and other disorders of the eye. Is the patient up to date with their annual eye exam?  No  Who is the provider or what is the name of the office in which the patient attends annual eye exams[place] is not established with a provider, would they like to be referred to a provider to establish care?  N/A .   Dental Screening: Recommended annual dental exams for proper oral hygiene  Community Resource Referral / Chronic Care Management: CRR required this visit?  No   CCM required this visit?  No      Plan:     I have personally reviewed and noted the following in the patient's chart:   Medical and social history Use of alcohol, tobacco or illicit drugs  Current medications and supplements including opioid prescriptions. Patient is currently taking opioid prescriptions. Information provided to patient regarding non-opioid alternatives. Patient advised to discuss non-opioid treatment plan with their provider. Functional ability and status Nutritional status Physical activity Advanced directives List of other physicians Hospitalizations, surgeries, and ER visits in previous 12 months Vitals Screenings to include cognitive, depression, and falls Referrals and appointments  In addition, I have reviewed and discussed with patient certain preventive protocols, quality metrics, and best practice recommendations. A written personalized care plan for preventive services as well as general preventive health recommendations were provided to patient.     JMichiel Cowboy RN   12/01/2021   Nurse Notes:  Mr. ACajuste, Thank you for taking time to come for your Medicare Wellness Visit. I appreciate your ongoing commitment to your health goals. Please review the following plan we discussed and let me know if I can assist you in the future.   These are the goals we discussed:  Goals      Patient Stated     Maintain my current health status and  stay as independent as possible.     Pharmacy Care Plan     CARE PLAN ENTRY  Current Barriers:  Chronic Disease Management support, education, and care coordination needs related to Hypertension and Vitamin Deficiencies   Hypertension BP Readings from Last 3 Encounters:  01/14/20 131/76  01/09/20 132/88  10/06/19 134/84  Pharmacist Clinical Goal(s): Over the next 180 days, patient will work with PharmD and providers to maintain BP goal <130/80 Current regimen:  Indapamide 1.25 mg daily Irbesartan 150 mg daily Interventions: Discussed benefits of monitoring BP at home Patient self care activities - Over the next 180 days, patient will: Check BP 1-2 times  weekly, document, and provide at future appointments Ensure daily salt intake < 2300 mg/day  Vitamin Deficiencies  Ref. Range 04/28/2019 11:38  Vitamin B1 (Thiamine) Latest Ref Range: 8 - 30 nmol/L 18  VITD Latest Ref Range: 30.00 - 100.00 ng/mL 24.74 (L)  Vitamin B12 Latest Ref Range: 211 - 911 pg/mL 452  Zinc Latest Ref Range: 60 - 130 mcg/dL 41 (L)  Pharmacist Clinical Goal(s) Over the next 180 days, patient will work with PharmD and providers to optimize vitamin regimen Current regimen:  Vitamin B12 1000 mcg/mL injection Vitamin D 2000 IU - 2 tablet daily Zinc 50 mg daily Multivitamin Interventions: Discussed optimal vitamin ranges  Patient self care activities - Over the next 180 days, patient will: Continue supplements as directed  Medication management Pharmacist Clinical Goal(s): Over the next 180 days, patient will work with PharmD and providers to maintain optimal medication adherence Current pharmacy: [facility] Interventions Comprehensive medication review performed. Continue current medication management strategy Patient self care activities - Over the next 180 days, patient will: Focus on medication adherence by fill date Take medications as prescribed Report any questions or concerns to PharmD and/or  provider(s)  Please see past updates related to this goal by clicking on the "Past Updates" button in the selected goal         This is a list of the screening recommended for you and due dates:  Health Maintenance  Topic Date Due   HIV Screening  Never done   Zoster (Shingles) Vaccine (1 of 2) Never done   COVID-19 Vaccine (3 - Moderna risk series) 12/27/2019   Flu Shot  06/04/2022*   Tetanus Vaccine  09/06/2026   Colon Cancer Screening  05/03/2028   Hepatitis C Screening: USPSTF Recommendation to screen - Ages 18-79 yo.  Completed   HPV Vaccine  Aged Out  *Topic was postponed. The date shown is not the original due date.

## 2021-12-05 DIAGNOSIS — M5416 Radiculopathy, lumbar region: Secondary | ICD-10-CM | POA: Diagnosis not present

## 2021-12-12 ENCOUNTER — Encounter: Payer: Self-pay | Admitting: Internal Medicine

## 2021-12-12 ENCOUNTER — Ambulatory Visit (INDEPENDENT_AMBULATORY_CARE_PROVIDER_SITE_OTHER): Payer: Medicare Other | Admitting: Internal Medicine

## 2021-12-12 VITALS — BP 122/86 | HR 65 | Temp 97.8°F | Ht 70.0 in | Wt 236.4 lb

## 2021-12-12 DIAGNOSIS — Z9884 Bariatric surgery status: Secondary | ICD-10-CM | POA: Diagnosis not present

## 2021-12-12 DIAGNOSIS — E559 Vitamin D deficiency, unspecified: Secondary | ICD-10-CM | POA: Diagnosis not present

## 2021-12-12 DIAGNOSIS — N400 Enlarged prostate without lower urinary tract symptoms: Secondary | ICD-10-CM

## 2021-12-12 DIAGNOSIS — E60 Dietary zinc deficiency: Secondary | ICD-10-CM

## 2021-12-12 DIAGNOSIS — E519 Thiamine deficiency, unspecified: Secondary | ICD-10-CM

## 2021-12-12 DIAGNOSIS — R1013 Epigastric pain: Secondary | ICD-10-CM | POA: Diagnosis not present

## 2021-12-12 DIAGNOSIS — Z6835 Body mass index (BMI) 35.0-35.9, adult: Secondary | ICD-10-CM

## 2021-12-12 DIAGNOSIS — D508 Other iron deficiency anemias: Secondary | ICD-10-CM

## 2021-12-12 DIAGNOSIS — I10 Essential (primary) hypertension: Secondary | ICD-10-CM | POA: Diagnosis not present

## 2021-12-12 DIAGNOSIS — K7581 Nonalcoholic steatohepatitis (NASH): Secondary | ICD-10-CM

## 2021-12-12 DIAGNOSIS — N5201 Erectile dysfunction due to arterial insufficiency: Secondary | ICD-10-CM

## 2021-12-12 DIAGNOSIS — Z0000 Encounter for general adult medical examination without abnormal findings: Secondary | ICD-10-CM | POA: Diagnosis not present

## 2021-12-12 DIAGNOSIS — K8020 Calculus of gallbladder without cholecystitis without obstruction: Secondary | ICD-10-CM

## 2021-12-12 DIAGNOSIS — E785 Hyperlipidemia, unspecified: Secondary | ICD-10-CM

## 2021-12-12 DIAGNOSIS — Z0001 Encounter for general adult medical examination with abnormal findings: Secondary | ICD-10-CM

## 2021-12-12 MED ORDER — SILDENAFIL CITRATE 20 MG PO TABS: 100.0000 mg | ORAL_TABLET | ORAL | 3 refills | Status: DC

## 2021-12-12 NOTE — Patient Instructions (Signed)
Health Maintenance, Male Adopting a healthy lifestyle and getting preventive care are important in promoting health and wellness. Ask your health care provider about: The right schedule for you to have regular tests and exams. Things you can do on your own to prevent diseases and keep yourself healthy. What should I know about diet, weight, and exercise? Eat a healthy diet  Eat a diet that includes plenty of vegetables, fruits, low-fat dairy products, and lean protein. Do not eat a lot of foods that are high in solid fats, added sugars, or sodium. Maintain a healthy weight Body mass index (BMI) is a measurement that can be used to identify possible weight problems. It estimates body fat based on height and weight. Your health care provider can help determine your BMI and help you achieve or maintain a healthy weight. Get regular exercise Get regular exercise. This is one of the most important things you can do for your health. Most adults should: Exercise for at least 150 minutes each week. The exercise should increase your heart rate and make you sweat (moderate-intensity exercise). Do strengthening exercises at least twice a week. This is in addition to the moderate-intensity exercise. Spend less time sitting. Even light physical activity can be beneficial. Watch cholesterol and blood lipids Have your blood tested for lipids and cholesterol at 53 years of age, then have this test every 5 years. You may need to have your cholesterol levels checked more often if: Your lipid or cholesterol levels are high. You are older than 53 years of age. You are at high risk for heart disease. What should I know about cancer screening? Many types of cancers can be detected early and may often be prevented. Depending on your health history and family history, you may need to have cancer screening at various ages. This may include screening for: Colorectal cancer. Prostate cancer. Skin cancer. Lung  cancer. What should I know about heart disease, diabetes, and high blood pressure? Blood pressure and heart disease High blood pressure causes heart disease and increases the risk of stroke. This is more likely to develop in people who have high blood pressure readings or are overweight. Talk with your health care provider about your target blood pressure readings. Have your blood pressure checked: Every 3-5 years if you are 18-39 years of age. Every year if you are 40 years old or older. If you are between the ages of 65 and 75 and are a current or former smoker, ask your health care provider if you should have a one-time screening for abdominal aortic aneurysm (AAA). Diabetes Have regular diabetes screenings. This checks your fasting blood sugar level. Have the screening done: Once every three years after age 45 if you are at a normal weight and have a low risk for diabetes. More often and at a younger age if you are overweight or have a high risk for diabetes. What should I know about preventing infection? Hepatitis B If you have a higher risk for hepatitis B, you should be screened for this virus. Talk with your health care provider to find out if you are at risk for hepatitis B infection. Hepatitis C Blood testing is recommended for: Everyone born from 1945 through 1965. Anyone with known risk factors for hepatitis C. Sexually transmitted infections (STIs) You should be screened each year for STIs, including gonorrhea and chlamydia, if: You are sexually active and are younger than 53 years of age. You are older than 53 years of age and your   health care provider tells you that you are at risk for this type of infection. Your sexual activity has changed since you were last screened, and you are at increased risk for chlamydia or gonorrhea. Ask your health care provider if you are at risk. Ask your health care provider about whether you are at high risk for HIV. Your health care provider  may recommend a prescription medicine to help prevent HIV infection. If you choose to take medicine to prevent HIV, you should first get tested for HIV. You should then be tested every 3 months for as long as you are taking the medicine. Follow these instructions at home: Alcohol use Do not drink alcohol if your health care provider tells you not to drink. If you drink alcohol: Limit how much you have to 0-2 drinks a day. Know how much alcohol is in your drink. In the [place] one drink equals one 12 oz bottle of beer (355 mL), one 5 oz glass of wine (148 mL), or one 1 oz glass of hard liquor (44 mL). Lifestyle Do not use any products that contain nicotine or tobacco. These products include cigarettes, chewing tobacco, and vaping devices, such as e-cigarettes. If you need help quitting, ask your health care provider. Do not use street drugs. Do not share needles. Ask your health care provider for help if you need support or information about quitting drugs. General instructions Schedule regular health, dental, and eye exams. Stay current with your vaccines. Tell your health care provider if: You often feel depressed. You have ever been abused or do not feel safe at home. Summary Adopting a healthy lifestyle and getting preventive care are important in promoting health and wellness. Follow your health care provider's instructions about healthy diet, exercising, and getting tested or screened for diseases. Follow your health care provider's instructions on monitoring your cholesterol and blood pressure. This information is not intended to replace advice given to you by your health care provider. Make sure you discuss any questions you have with your health care provider. Document Revised: 07/12/2020 Document Reviewed: 07/12/2020 Elsevier Patient Education  2023 [employer]  

## 2021-12-12 NOTE — Progress Notes (Signed)
Subjective:  Patient ID: Bradley [name], male    DOB: 1970-10-27  Age: 51 y.o. MRN: [id number]  CC: Annual Exam, Hypertension, and Abdominal Pain   HPI Bradley [name] presents for a CPX and f/up -  He walks a few miles/day and has good endurance - he denies DOE, CP, SOB, edema.  Outpatient Medications Prior to Visit  Medication Sig Dispense Refill   Cholecalciferol (VITAMIN D3) 50 MCG (2000 UT) TABS TAKE 2 TABLETS BY MOUTH DAILY 180 tablet 0   DODEX 1000 MCG/ML injection ADMINISTER 1 ML(1000 MCG) IN THE MUSCLE EVERY 30 DAYS 3 mL 0   DULoxetine (CYMBALTA) 20 MG capsule Take 20 mg by mouth 2 (two) times daily.     Fe Fum-Fe Poly-Vit C-Lactobac (FUSION) 65-65-25-30 MG CAPS TAKE ONE CAPSULE BY MOUTH EVERY MORNING AND AT BEDTIME 583 capsule 1   folic acid (FOLVITE) 1 MG tablet TAKE 1 TABLET(1 MG) BY MOUTH DAILY 90 tablet 1   gabapentin (NEURONTIN) 300 MG capsule Take 300 mg by mouth 3 (three) times daily.     INS SYRINGE/NEEDLE 1CC/28G 28G X 1/2" 1 ML MISC Use to inject b12 monthly 10 each 1   Insulin Pen Needle 32G X 6 MM MISC 1 Act by Does not apply route daily. 100 each 1   Multiple Vitamin (MULTIVITAMIN) tablet Take 1 tablet by mouth daily.     Oxycodone HCl 10 MG TABS Take 10 mg by mouth 5 (five) times daily as needed.     SYMBICORT 80-4.5 MCG/ACT inhaler Inhale 2 puffs into the lungs 2 (two) times daily.     fluocinonide-emollient (LIDEX-E) 0.05 % cream Apply 1 application topically 2 (two) times daily. 120 g 0   imiquimod (ALDARA) 5 % cream Apply topically.     indapamide (LOZOL) 1.25 MG tablet Take 1 tablet (1.25 mg total) by mouth daily. 90 tablet 0   irbesartan (AVAPRO) 300 MG tablet Take 1 tablet (300 mg total) by mouth daily. 90 tablet 0   Liraglutide -Weight Management (SAXENDA) 18 MG/3ML SOPN Inject 3 mg into the skin daily. 45 mL 1   sildenafil (REVATIO) 20 MG tablet Take 5 tablets by mouth as directed.     Buprenorphine HCl-Naloxone HCl 8-2 MG FILM Place 1 strip under the  tongue 2 (two) times daily. (Patient not taking: Reported on 12/01/2021)     Eszopiclone 3 MG TABS Take 1 tablet (3 mg total) by mouth at bedtime. Take immediately before bedtime (Patient not taking: Reported on 12/01/2021) 90 tablet 0   ferrous sulfate 220 (44 Fe) MG/5ML solution Take 5 mLs (220 mg total) by mouth 3 (three) times daily with meals. 450 mL 1   Fluticasone-Umeclidin-Vilant (TRELEGY ELLIPTA) 100-62.5-25 MCG/INH AEPB Inhale 1 puff into the lungs daily. 120 each 1   hydrOXYzine (VISTARIL) 25 MG capsule Take 1 capsule (25 mg total) by mouth every 8 (eight) hours as needed. 30 capsule 0   thiamine (VITAMIN B-1) 50 MG tablet TAKE 1 TABLET BY MOUTH DAILY 90 tablet 1   zinc gluconate 50 MG tablet TAKE 1 TABLET(50 MG) BY MOUTH DAILY (Patient not taking: Reported on 12/01/2021) 90 tablet 0   No facility-administered medications prior to visit.    ROS Review of Systems  Constitutional: Negative.  Negative for chills, diaphoresis, fatigue and fever.  HENT: Negative.    Eyes: Negative.   Respiratory: Negative.  Negative for cough, chest tightness, shortness of breath and wheezing.   Cardiovascular:  Negative for chest pain, palpitations  and leg swelling.  Gastrointestinal:  Positive for abdominal pain. Negative for constipation, nausea and vomiting.       Intermittent epigastric pain for one year  Genitourinary: Negative.  Negative for dysuria and hematuria.       ++ED  Musculoskeletal:  Positive for arthralgias and back pain. Negative for joint swelling and myalgias.  Skin: Negative.   Neurological:  Negative for dizziness, weakness, light-headedness and headaches.  Hematological:  Negative for adenopathy. Does not bruise/bleed easily.  Psychiatric/Behavioral: Negative.      Objective:  BP 122/86   Pulse 65   Temp 97.8 F (36.6 C) (Oral)   Ht 5' 10"  (1.778 m)   Wt 236 lb 6 oz (107.2 kg)   SpO2 96%   BMI 33.92 kg/m   BP Readings from Last 3 Encounters:  12/12/21 122/86   07/08/21 111/72  06/15/21 (!) 152/96    Wt Readings from Last 3 Encounters:  12/12/21 236 lb 6 oz (107.2 kg)  07/08/21 228 lb 9.6 oz (103.7 kg)  06/15/21 229 lb (103.9 kg)    Physical Exam Vitals reviewed.  Constitutional:      Appearance: He is not ill-appearing.  HENT:     Nose: Nose normal.     Mouth/Throat:     Mouth: Mucous membranes are moist.  Eyes:     General: No scleral icterus.    Conjunctiva/sclera: Conjunctivae normal.  Cardiovascular:     Rate and Rhythm: Normal rate and regular rhythm.     Heart sounds: S1 normal and S2 normal. Murmur heard.     Systolic murmur is present with a grade of 2/6.     No diastolic murmur is present.     No friction rub. No gallop.  Pulmonary:     Breath sounds: No stridor. No wheezing, rhonchi or rales.  Abdominal:     General: Abdomen is flat.     Palpations: There is no mass.     Tenderness: There is no abdominal tenderness. There is no guarding.     Hernia: No hernia is present. There is no hernia in the left inguinal area or right inguinal area.  Genitourinary:    Pubic Area: No rash.      Penis: Normal and circumcised.      Testes: Normal.     Epididymis:     Right: Normal.     Left: Normal.  Musculoskeletal:     Cervical back: Neck supple.     Right lower leg: No edema.     Left lower leg: No edema.  Lymphadenopathy:     Cervical: No cervical adenopathy.     Lower Body: No right inguinal adenopathy. No left inguinal adenopathy.  Skin:    General: Skin is warm and dry.  Neurological:     General: No focal deficit present.     Mental Status: He is alert.  Psychiatric:        Mood and Affect: Mood normal.        Behavior: Behavior normal.     Lab Results  Component Value Date   WBC 5.3 12/13/2021   HGB 13.8 12/13/2021   HCT 42.4 12/13/2021   PLT 232.0 12/13/2021   GLUCOSE 84 12/13/2021   CHOL 149 12/13/2021   TRIG 103.0 12/13/2021   HDL 55.50 12/13/2021   LDLCALC 73 12/13/2021   ALT 30 12/13/2021    AST 18 12/13/2021   NA 134 (L) 12/13/2021   K 4.2 12/13/2021   CL 99 12/13/2021  CREATININE 0.89 12/13/2021   BUN 16 12/13/2021   CO2 28 12/13/2021   TSH 2.56 06/15/2021   PSA 0.63 12/13/2021   HGBA1C 5.6 06/15/2021    ECHOCARDIOGRAM COMPLETE  Result Date: 05/20/2021    ECHOCARDIOGRAM REPORT   Patient Name:   Bradley [name] Date of Exam: 05/20/2021 Medical Rec #:  [id number]       Height:       70.0 in Accession #:    [id number]      Weight:       215.0 lb Date of Birth:  1970/09/16       BSA:          2.152 m Patient Age:    47 years        BP:           145/93 mmHg Patient Gender: M               HR:           68 bpm. Exam Location:  Outpatient Procedure: 2D Echo, Cardiac Doppler and Color Doppler Indications:    Mild aortic stenosis [[id number]]  History:        Patient has prior history of Echocardiogram examinations, most                 recent 06/16/2019. CAD; Risk Factors:Hypertension.  Sonographer:    Bernadene Person RDCS Referring Phys: [id number] PAULA V ROSS IMPRESSIONS  1. Normal LV function; calcified aortic valve with mild AS (mean gradient 13 mmHg and AVA 1.7 cm2).  2. Left ventricular ejection fraction, by estimation, is 60 to 65%. The left ventricle has normal function. The left ventricle has no regional wall motion abnormalities. There is mild concentric left ventricular hypertrophy. Left ventricular diastolic parameters are consistent with Grade II diastolic dysfunction (pseudonormalization).  3. Right ventricular systolic function is normal. The right ventricular size is normal.  4. Left atrial size was moderately dilated.  5. The mitral valve is normal in structure. No evidence of mitral valve regurgitation. No evidence of mitral stenosis.  6. The aortic valve is tricuspid. Aortic valve regurgitation is not visualized. Mild aortic valve stenosis.  7. The inferior vena cava is normal in size with greater than 50% respiratory variability, suggesting right atrial pressure of 3 mmHg. FINDINGS   Left Ventricle: Left ventricular ejection fraction, by estimation, is 60 to 65%. The left ventricle has normal function. The left ventricle has no regional wall motion abnormalities. The left ventricular internal cavity size was normal in size. There is  mild concentric left ventricular hypertrophy. Left ventricular diastolic parameters are consistent with Grade II diastolic dysfunction (pseudonormalization). Right Ventricle: The right ventricular size is normal. Right ventricular systolic function is normal. Left Atrium: Left atrial size was moderately dilated. Right Atrium: Right atrial size was normal in size. Pericardium: There is no evidence of pericardial effusion. Mitral Valve: The mitral valve is normal in structure. Mild mitral annular calcification. No evidence of mitral valve regurgitation. No evidence of mitral valve stenosis. Tricuspid Valve: The tricuspid valve is normal in structure. Tricuspid valve regurgitation is trivial. No evidence of tricuspid stenosis. Aortic Valve: The aortic valve is tricuspid. Aortic valve regurgitation is not visualized. Mild aortic stenosis is present. Aortic valve mean gradient measures 12.0 mmHg. Aortic valve peak gradient measures 23.2 mmHg. Aortic valve area, by VTI measures 1.66 cm. Pulmonic Valve: The pulmonic valve was normal in structure. Pulmonic valve regurgitation is not visualized. No evidence of pulmonic stenosis. Aorta: The  aortic root is normal in size and structure. Venous: The inferior vena cava is normal in size with greater than 50% respiratory variability, suggesting right atrial pressure of 3 mmHg. IAS/Shunts: No atrial level shunt detected by color flow Doppler. Additional Comments: Normal LV function; calcified aortic valve with mild AS (mean gradient 13 mmHg and AVA 1.7 cm2).  LEFT VENTRICLE PLAX 2D LVIDd:         4.40 cm      Diastology LVIDs:         2.80 cm      LV e' medial:    7.38 cm/s LV PW:         1.20 cm      LV E/e' medial:  13.2 LV IVS:         1.20 cm      LV e' lateral:   9.81 cm/s LVOT diam:     2.10 cm      LV E/e' lateral: 9.9 LV SV:         84 LV SV Index:   39 LVOT Area:     3.46 cm  LV Volumes (MOD) LV vol d, MOD A2C: 132.0 ml LV vol d, MOD A4C: 148.0 ml LV vol s, MOD A2C: 56.9 ml LV vol s, MOD A4C: 57.7 ml LV SV MOD A2C:     75.1 ml LV SV MOD A4C:     148.0 ml LV SV MOD BP:      88.3 ml RIGHT VENTRICLE RV S prime:     10.90 cm/s TAPSE (M-mode): 2.2 cm LEFT ATRIUM             Index        RIGHT ATRIUM           Index LA diam:        3.50 cm 1.63 cm/m   RA Area:     17.10 cm LA Vol (A2C):   84.2 ml 39.12 ml/m  RA Volume:   44.70 ml  20.77 ml/m LA Vol (A4C):   99.6 ml 46.27 ml/m LA Biplane Vol: 94.2 ml 43.76 ml/m  AORTIC VALVE AV Area (Vmax):    1.61 cm AV Area (Vmean):   1.57 cm AV Area (VTI):     1.66 cm AV Vmax:           241.00 cm/s AV Vmean:          162.000 cm/s AV VTI:            0.505 m AV Peak Grad:      23.2 mmHg AV Mean Grad:      12.0 mmHg LVOT Vmax:         112.00 cm/s LVOT Vmean:        73.400 cm/s LVOT VTI:          0.242 m LVOT/AV VTI ratio: 0.48  AORTA Ao Root diam: 3.30 cm Ao Asc diam:  4.00 cm MITRAL VALVE MV Area (PHT): 3.48 cm    SHUNTS MV Decel Time: 218 msec    Systemic VTI:  0.24 m MV E velocity: 97.60 cm/s  Systemic Diam: 2.10 cm MV A velocity: 90.60 cm/s MV E/A ratio:  1.08 Kirk Ruths MD Electronically signed by Kirk Ruths MD Signature Date/Time: 05/20/2021/12:39:06 PM    Final    US Abdomen Limited RUQ (LIVER/GB)  Result Date: 12/18/2021 CLINICAL DATA:  51 year old male with abdominal pain. EXAM: ULTRASOUND ABDOMEN LIMITED RIGHT UPPER QUADRANT COMPARISON:  04/25/2018 ultrasound and prior  studies FINDINGS: Gallbladder: Multiple mobile gallstones are identified, the largest measuring 1.4 cm. There is no evidence of gallbladder wall thickening, pericholecystic fluid or sonographic Murphy sign. Common bile duct: Diameter: 6 mm. There is no evidence of intrahepatic or extrahepatic biliary  dilatation. Liver: Increased hepatic echogenicity identified compatible with hepatic steatosis. No other abnormalities are noted. Portal vein is patent on color Doppler imaging with normal direction of blood flow towards the liver. Other: None. IMPRESSION: 1. Cholelithiasis without evidence of acute cholecystitis. 2. Hepatic steatosis. 3. No biliary dilatation. Electronically Signed   By: Margarette Canada M.D.   On: 12/18/2021 11:46     Assessment & Plan:   Bradley [name] was seen today for annual exam, hypertension and abdominal pain.  Diagnoses and all orders for this visit:  Essential hypertension- His BP is well controlled. -     Basic metabolic panel; Future -     Basic metabolic panel  NASH (nonalcoholic steatohepatitis)- LFT's are normal. -     Hepatic function panel; Future -     Hepatic function panel  Benign prostatic hyperplasia without lower urinary tract symptoms -     PSA; Future -     PSA  Hyperlipidemia LDL goal <130- Statin is not indicated. -     Lipid panel; Future -     Hepatic function panel; Future -     Hepatic function panel -     Lipid panel  Thiamine deficiency- B1 is normal. -     CBC with Differential/Platelet; Future -     Vitamin B1; Future -     Vitamin B1 -     CBC with Differential/Platelet  History of gastric bypass- Will treat the zinc deficiency. -     IBC + Ferritin; Future -     Vitamin B12; Future -     Vitamin B1; Future -     VITAMIN D 25 Hydroxy (Vit-D Deficiency, Fractures); Future -     Zinc; Future -     Zinc -     VITAMIN D 25 Hydroxy (Vit-D Deficiency, Fractures) -     Vitamin B1 -     Vitamin B12 -     IBC + Ferritin  Other iron deficiency anemia- H/H are normal. -     IBC + Ferritin; Future -     CBC with Differential/Platelet; Future -     CBC with Differential/Platelet -     IBC + Ferritin  Vitamin D deficiency -     VITAMIN D 25 Hydroxy (Vit-D Deficiency, Fractures); Future -     VITAMIN D 25 Hydroxy (Vit-D Deficiency,  Fractures)  Encounter for general adult medical examination with abnormal findings- Exam completed, labs reviewed, vaccines reviewed- He refused a flu vax, cancer screenings are UTD, pt ed material was given.  Erectile dysfunction due to arterial insufficiency -     sildenafil (REVATIO) 20 MG tablet; Take 5 tablets (100 mg total) by mouth as directed.  Epigastric abdominal pain- U/S reveals gallstones. Will refer to GS. -     US Abdomen Limited RUQ (LIVER/GB); Future   I have discontinued Eriverto Byrnes. Guinyard's Buprenorphine HCl-Naloxone HCl, ferrous sulfate, Trelegy Ellipta, hydrOXYzine, fluocinonide-emollient, imiquimod, thiamine, Saxenda, Eszopiclone, and indapamide. I have also changed his sildenafil. Additionally, I am having him start on phentermine. Lastly, I am having him maintain his multivitamin, INS SYRINGE/NEEDLE 1CC/28G, Symbicort, gabapentin, Oxycodone HCl, Dodex, Insulin Pen Needle, Vitamin D3, folic acid, Fusion, DULoxetine, irbesartan, and zinc gluconate.  Meds ordered this  encounter  Medications   sildenafil (REVATIO) 20 MG tablet    Sig: Take 5 tablets (100 mg total) by mouth as directed.    Dispense:  60 tablet    Refill:  3   irbesartan (AVAPRO) 300 MG tablet    Sig: Take 1 tablet (300 mg total) by mouth daily.    Dispense:  90 tablet    Refill:  0   phentermine 37.5 MG capsule    Sig: Take 1 capsule (37.5 mg total) by mouth every morning.    Dispense:  90 capsule    Refill:  0   zinc gluconate 50 MG tablet    Sig: TAKE 1 TABLET(50 MG) BY MOUTH DAILY    Dispense:  90 tablet    Refill:  1   In addition to time spent on CPE, I spent 50 minutes in preparing to see the patient by review of recent labs,  and images, obtaining and reviewing separately obtained history, communicating with the patient, ordering medications and an xray, and documenting clinical information in the EHR including the differential Dx, treatment, and any further evaluation and management of  multiple complex medical issues.     Follow-up: Return in about 3 months (around 03/14/2022).  [name], MD

## 2021-12-13 DIAGNOSIS — E519 Thiamine deficiency, unspecified: Secondary | ICD-10-CM | POA: Diagnosis not present

## 2021-12-13 DIAGNOSIS — Z9884 Bariatric surgery status: Secondary | ICD-10-CM | POA: Diagnosis not present

## 2021-12-13 LAB — BASIC METABOLIC PANEL
BUN: 16 mg/dL (ref 6–23)
CO2: 28 mEq/L (ref 19–32)
Calcium: 8.8 mg/dL (ref 8.4–10.5)
Chloride: 99 mEq/L (ref 96–112)
Creatinine, Ser: 0.89 mg/dL (ref 0.40–1.50)
GFR: 98.04 mL/min (ref >60.00)
Glucose, Bld: 84 mg/dL (ref 70–99)
Potassium: 4.2 mEq/L (ref 3.5–5.1)
Sodium: 134 mEq/L — ABNORMAL LOW (ref 135–145)

## 2021-12-13 LAB — CBC WITH DIFFERENTIAL/PLATELET
Basophils Absolute: 0 10*3/uL (ref 0.0–0.1)
Basophils Relative: 0.5 % (ref 0.0–3.0)
Eosinophils Absolute: 0.1 10*3/uL (ref 0.0–0.7)
Eosinophils Relative: 1.4 % (ref 0.0–5.0)
HCT: 42.4 % (ref 39.0–52.0)
Hemoglobin: 13.8 g/dL (ref 13.0–17.0)
Lymphocytes Relative: 35.1 % (ref 12.0–46.0)
Lymphs Abs: 1.9 10*3/uL (ref 0.7–4.0)
MCHC: 32.6 g/dL (ref 30.0–36.0)
MCV: 87 fl (ref 78.0–100.0)
Monocytes Absolute: 0.6 10*3/uL (ref 0.1–1.0)
Monocytes Relative: 11.7 % (ref 3.0–12.0)
Neutro Abs: 2.7 10*3/uL (ref 1.4–7.7)
Neutrophils Relative %: 51.3 % (ref 43.0–77.0)
Platelets: 232 10*3/uL (ref 150.0–400.0)
RBC: 4.87 Mil/uL (ref 4.22–5.81)
RDW: 13.5 % (ref 11.5–15.5)
WBC: 5.3 10*3/uL (ref 4.0–10.5)

## 2021-12-13 LAB — LIPID PANEL
Cholesterol: 149 mg/dL (ref 0–200)
HDL: 55.5 mg/dL (ref >39.00)
LDL Cholesterol: 73 mg/dL (ref 0–99)
NonHDL: 93.52
Total CHOL/HDL Ratio: 3
Triglycerides: 103 mg/dL (ref 0.0–149.0)
VLDL: 20.6 mg/dL (ref 0.0–40.0)

## 2021-12-13 LAB — HEPATIC FUNCTION PANEL
ALT: 30 U/L (ref 0–53)
AST: 18 U/L (ref 0–37)
Albumin: 3.7 g/dL (ref 3.5–5.2)
Alkaline Phosphatase: 86 U/L (ref 39–117)
Bilirubin, Direct: 0.1 mg/dL (ref 0.0–0.3)
Total Bilirubin: 0.4 mg/dL (ref 0.2–1.2)
Total Protein: 6.2 g/dL (ref 6.0–8.3)

## 2021-12-14 DIAGNOSIS — R1013 Epigastric pain: Secondary | ICD-10-CM | POA: Insufficient documentation

## 2021-12-14 DIAGNOSIS — N5201 Erectile dysfunction due to arterial insufficiency: Secondary | ICD-10-CM | POA: Insufficient documentation

## 2021-12-14 LAB — IBC + FERRITIN
Ferritin: 35.4 ng/mL (ref 22.0–322.0)
Iron: 62 ug/dL (ref 42–165)
Saturation Ratios: 20.1 % (ref 20.0–50.0)
TIBC: 308 ug/dL (ref 250.0–450.0)
Transferrin: 220 mg/dL (ref 212.0–360.0)

## 2021-12-14 LAB — VITAMIN D 25 HYDROXY (VIT D DEFICIENCY, FRACTURES): VITD: 32.48 ng/mL (ref 30.00–100.00)

## 2021-12-14 LAB — PSA: PSA: 0.63 ng/mL (ref 0.10–4.00)

## 2021-12-14 LAB — VITAMIN B12: Vitamin B-12: 383 pg/mL (ref 211–911)

## 2021-12-15 ENCOUNTER — Other Ambulatory Visit: Payer: Self-pay | Admitting: Internal Medicine

## 2021-12-15 ENCOUNTER — Encounter: Payer: Self-pay | Admitting: Internal Medicine

## 2021-12-15 DIAGNOSIS — I10 Essential (primary) hypertension: Secondary | ICD-10-CM

## 2021-12-15 MED ORDER — IRBESARTAN 300 MG PO TABS: 300.0000 mg | ORAL_TABLET | Freq: Every day | ORAL | 0 refills | Status: DC

## 2021-12-16 ENCOUNTER — Ambulatory Visit
Admission: RE | Admit: 2021-12-16 | Discharge: 2021-12-16 | Disposition: A | Discharge status: Home / Self Care | Payer: Medicare Other | Source: Ambulatory Visit | Attending: Internal Medicine | Admitting: Internal Medicine

## 2021-12-16 DIAGNOSIS — K8020 Calculus of gallbladder without cholecystitis without obstruction: Secondary | ICD-10-CM | POA: Diagnosis not present

## 2021-12-16 DIAGNOSIS — R109 Unspecified abdominal pain: Secondary | ICD-10-CM | POA: Diagnosis not present

## 2021-12-16 DIAGNOSIS — K760 Fatty (change of) liver, not elsewhere classified: Secondary | ICD-10-CM | POA: Diagnosis not present

## 2021-12-16 DIAGNOSIS — R1013 Epigastric pain: Secondary | ICD-10-CM

## 2021-12-16 DIAGNOSIS — E66812 Obesity, class 2: Secondary | ICD-10-CM | POA: Insufficient documentation

## 2021-12-16 MED ORDER — PHENTERMINE HCL 37.5 MG PO CAPS: 37.5000 mg | ORAL_CAPSULE | ORAL | 0 refills | Status: DC

## 2021-12-17 LAB — ZINC: Zinc: 40 ug/dL — ABNORMAL LOW (ref 60–130)

## 2021-12-17 LAB — VITAMIN B1: Vitamin B1 (Thiamine): 15 nmol/L (ref 8–30)

## 2021-12-18 DIAGNOSIS — Z9884 Bariatric surgery status: Secondary | ICD-10-CM | POA: Insufficient documentation

## 2021-12-18 DIAGNOSIS — K8020 Calculus of gallbladder without cholecystitis without obstruction: Secondary | ICD-10-CM | POA: Insufficient documentation

## 2021-12-18 MED ORDER — ZINC GLUCONATE 50 MG PO TABS: ORAL_TABLET | ORAL | 1 refills | Status: DC

## 2021-12-18 MED ORDER — SHINGRIX 50 MCG/0.5ML IM SUSR: 0.5000 mL | Freq: Once | INTRAMUSCULAR | 1 refills | Status: AC

## 2021-12-20 DIAGNOSIS — M13841 Other specified arthritis, right hand: Secondary | ICD-10-CM | POA: Diagnosis not present

## 2021-12-20 DIAGNOSIS — M13842 Other specified arthritis, left hand: Secondary | ICD-10-CM | POA: Diagnosis not present

## 2021-12-22 ENCOUNTER — Encounter: Payer: Self-pay | Admitting: Internal Medicine

## 2021-12-23 ENCOUNTER — Other Ambulatory Visit: Payer: Self-pay | Admitting: Internal Medicine

## 2021-12-23 DIAGNOSIS — G8929 Other chronic pain: Secondary | ICD-10-CM

## 2021-12-26 DIAGNOSIS — M5416 Radiculopathy, lumbar region: Secondary | ICD-10-CM | POA: Diagnosis not present

## 2021-12-27 ENCOUNTER — Other Ambulatory Visit: Payer: Self-pay | Admitting: Internal Medicine

## 2021-12-27 DIAGNOSIS — K8020 Calculus of gallbladder without cholecystitis without obstruction: Secondary | ICD-10-CM

## 2021-12-27 NOTE — Telephone Encounter (Signed)
Gall bladder surgery

## 2021-12-28 ENCOUNTER — Encounter: Payer: Self-pay | Admitting: Internal Medicine

## 2021-12-29 ENCOUNTER — Telehealth: Payer: Medicare Other | Admitting: Physician Assistant

## 2021-12-29 ENCOUNTER — Telehealth: Payer: Medicare Other | Admitting: Family

## 2021-12-29 DIAGNOSIS — B370 Candidal stomatitis: Secondary | ICD-10-CM | POA: Diagnosis not present

## 2021-12-29 MED ORDER — NYSTATIN 100000 UNIT/ML MT SUSP: 5.0000 mL | Freq: Four times a day (QID) | OROMUCOSAL | 0 refills | Status: DC

## 2021-12-29 NOTE — Telephone Encounter (Signed)
Bradley [name] said he called in yesterday and was told Bradley [name] sent the message ot Bradley [name] for a response but hasnt heard anything. Advised our response time is typically 24-48 hours and that Bradley [name] is currently out of the office. Bradley [name] was upset and said Bradley [name] he is frustrated that the office has "dropped the ball" with Bradley [name] care recently. Bradley [name] said that his mouth is very painful, can we have another provider send this in?   When he was here last for an appointment he was brought back to the room, waited for 45 minutes to an hour then Bradley [name] came in to the room grabbing supplies for another Bradley [name], asked Bradley [name] how long he had been sitting there as he was unaware that there was a Bradley [name] in the room and wasn't aware he was brought back. He has had several encounters where he has felt like the office has been dismissing his problems. Bradley [name] would like a call back from the manager in regards to this.

## 2021-12-29 NOTE — Progress Notes (Signed)
E-Visit for Bradley [name]  We are sorry that you are not feeling well.  Here is how we plan to help!  Based on what you have shared with me, it appears that you do have thrush.     The following medications should decrease the discomfort and help with healing. Nystatin Suspension 47m swish and swallow every 6 hours for 5-7 days    Prevention: Talk to your doctor if you are taking meds that are known to cause mouth ulcers such as:   Anti-inflammatory drugs (for example Ibuprofen, Naproxen sodium), pain killers, Beta blockers, Oral nicotine replacement drugs, Some street drugs (heroin).   Avoid allowing any tablets to dissolve in your mouth that are meant to swallowed whole Avoid foods/drinks that trigger or worsen symptoms Keep your mouth clean with daily brushing and flossing  Home Care: The goal with treatment is to ease the pain where ulcers occur and help them heal as quickly as possible.  There is no medical treatment to prevent mouth ulcers from coming back or recurring.  Avoid spicy and acidic foods Eat soft foods and avoid rough, crunchy foods Avoid chewing gum Do not use toothpaste that contains sodium lauryl sulphite Use a straw to drink which helps avoid liquids toughing the ulcers near the front of your mouth Use a very soft toothbrush If you have dentures or dental hardware that you feel is not fitting well or contributing to his, please see your dentist. Use saltwater mouthwash which helps healing. Dissolve a  teaspoon of salt in a glass of warm water. Swish around your mouth and spit it out. This can be used as needed if it is soothing.   GET HELP RIGHT AWAY IF: Persistent ulcers require checking IN PERSON (face to face). Any mouth lesion lasting longer than a month should be seen by your DENTIST as soon as possible for evaluation for possible oral cancer. If you have a non-painful ulcer in 1 or more areas of your mouth Ulcers that are spreading, are very large or particularly  painful Ulcers last longer than one week without improving on treatment If you develop a fever, swollen glands and begin to feel unwell Ulcers that developed after starting a new medication MAKE SURE YOU: Understand these instructions. Will watch your condition. Will get help right away if you are not doing well or get worse.  Thank you for choosing an e-visit.  Your e-visit answers were reviewed by a board certified advanced clinical practitioner to complete your personal care plan. Depending upon the condition, your plan could have included both over the counter or prescription medications.  Please review your pharmacy choice. Make sure the pharmacy is open so you can pick up prescription now. If there is a problem, you may contact your provider through [employer] have the prescription routed to another pharmacy.  Your safety is important to [place] If you have drug allergies check your prescription carefully.   For the next 24 hours you can use MyChart to ask questions about today's visit, request a non-urgent call back, or ask for a work or school excuse. You will get an email in the next two days asking about your experience. I hope that your e-visit has been valuable and will speed your recovery.  I have spent 5 minutes in review of e-visit questionnaire, review and updating patient chart, medical decision making and response to patient.   JMar Daring PA-C

## 2022-01-01 ENCOUNTER — Other Ambulatory Visit: Payer: Self-pay | Admitting: Internal Medicine

## 2022-01-01 DIAGNOSIS — D528 Other folate deficiency anemias: Secondary | ICD-10-CM

## 2022-01-02 ENCOUNTER — Telehealth: Payer: Medicare Other | Admitting: Physician Assistant

## 2022-01-02 DIAGNOSIS — B370 Candidal stomatitis: Secondary | ICD-10-CM

## 2022-01-02 NOTE — Progress Notes (Signed)
Because you recently completed an e-visit and had medication sent in just on 12/29/21, I feel your condition warrants further evaluation and I recommend that you be seen for a face to face visit.  Please contact your primary care physician practice to be seen. Many offices offer virtual options to be seen via video if you are not comfortable going in person to a medical facility at this time.  NOTE: You will NOT be charged for this eVisit.  If you do not have a PCP, [facility] offers a free physician referral service available at [phone]. Our trained staff has the experience, knowledge and resources to put you in touch with a physician who is right for you.    If you are having a true medical emergency please call 911.   Your e-visit answers were reviewed by a board certified advanced clinical practitioner to complete your personal care plan.  Thank you for using e-Visits.  I have spent 5 minutes in review of e-visit questionnaire, review and updating patient chart, medical decision making and response to patient.   Mar Daring, PA-C

## 2022-01-03 DIAGNOSIS — B370 Candidal stomatitis: Secondary | ICD-10-CM | POA: Diagnosis not present

## 2022-01-05 DIAGNOSIS — K8020 Calculus of gallbladder without cholecystitis without obstruction: Secondary | ICD-10-CM | POA: Diagnosis not present

## 2022-01-05 DIAGNOSIS — Z9889 Other specified postprocedural states: Secondary | ICD-10-CM | POA: Diagnosis not present

## 2022-01-05 DIAGNOSIS — Z9884 Bariatric surgery status: Secondary | ICD-10-CM | POA: Diagnosis not present

## 2022-01-06 NOTE — Patient Instructions (Signed)
DUE TO COVID-19 ONLY TWO VISITORS  (aged 53 and older)  ARE ALLOWED TO COME WITH YOU AND STAY IN THE WAITING ROOM ONLY DURING PRE OP AND PROCEDURE.   **NO VISITORS ARE ALLOWED IN THE SHORT STAY AREA OR RECOVERY ROOM!!**  IF YOU WILL BE ADMITTED INTO THE HOSPITAL YOU ARE ALLOWED ONLY FOUR SUPPORT PEOPLE DURING VISITATION HOURS ONLY (7 AM -8PM)   The support person(s) must pass our screening, gel in and out, and wear a mask at all times, including in the patient's room. Patients must also wear a mask when staff or their support person are in the room. Visitors GUEST BADGE MUST BE WORN VISIBLY  One adult visitor may remain with you overnight and MUST be in the room by 8 P.M.     Your procedure is scheduled on: 01/16/22   Report to [facility] Main Entrance    Report to admitting at  8:45 AM   Call this number if you have problems the morning of surgery [phone]   Do not eat food or drink:After Midnight.             If you have questions, please contact your surgeon's office.       Oral Hygiene is also important to reduce your risk of infection.                                    Remember - BRUSH YOUR TEETH THE MORNING OF SURGERY WITH YOUR REGULAR TOOTHPASTE   Do NOT smoke after Midnight   Take these medicines the morning of surgery with A SIP OF WATER: Gabapentin                                                                                                                           Duloxetine-Cymbalta                                                                                                                            Use your inhaler and bring with you                                You may not have any metal on your body including, jewelry, and body piercing  Do not wear lotions, powders, cologne, or deodorant                Men may shave face and neck.   Do not bring valuables to the hospital. [place].   Contacts, dentures or bridgework may not be worn into surgery.     Patients discharged on the day of surgery will not be allowed to drive home.  Someone NEEDS to stay with you for the first 24 hours after anesthesia.   Special Instructions: Bring a copy of your healthcare power of attorney and living will documents the day of surgery if you haven't scanned them before.              Please read over the following fact sheets you were given: IF YOU HAVE QUESTIONS ABOUT YOUR PRE-OP INSTRUCTIONS PLEASE CALL [phone]    [facility] - Preparing for Surgery Before surgery, you can play an important role.  Because skin is not sterile, your skin needs to be as free of germs as possible.  You can reduce the number of germs on your skin by washing with CHG (chlorahexidine gluconate) soap before surgery.  CHG is an antiseptic cleaner which kills germs and bonds with the skin to continue killing germs even after washing. Please DO NOT use if you have an allergy to CHG or antibacterial soaps.  If your skin becomes reddened/irritated stop using the CHG and inform your nurse when you arrive at Short Stay. You may shave your face/neck. Please follow these instructions carefully:  1.  Shower with CHG Soap the night before surgery and the  morning of Surgery.  2.  If you choose to wash your hair, wash your hair first as usual with your  normal  shampoo.  3.  After you shampoo, rinse your hair and body thoroughly to remove the  shampoo.                            4.  Use CHG as you would any other liquid soap.  You can apply chg directly  to the skin and wash                       Gently with a scrungie or clean washcloth.  5.  Apply the CHG Soap to your body ONLY FROM THE NECK DOWN.   Do not use on face/ open                           Wound or open sores. Avoid contact with eyes, ears mouth and genitals (private parts).                       Wash face,  Genitals (private parts) with your  normal soap.             6.  Wash thoroughly, paying special attention to the area where your surgery  will be performed.  7.  Thoroughly rinse your body with warm water from the neck down.  8.  DO NOT shower/wash with your normal soap after using and rinsing off  the CHG Soap.                9.  Pat yourself dry with a clean  towel.            10.  Wear clean pajamas.            11.  Place clean sheets on your bed the night of your first shower and do not  sleep with pets. Day of Surgery : Do not apply any lotions/deodorants the morning of surgery.  Please wear clean clothes to the hospital/surgery center.  FAILURE TO FOLLOW THESE INSTRUCTIONS MAY RESULT IN THE CANCELLATION OF YOUR SURGERY PATIENT SIGNATURE_________________________________  NURSE SIGNATURE__________________________________  ________________________________________________________________________

## 2022-01-09 ENCOUNTER — Encounter (HOSPITAL_COMMUNITY)
Admission: RE | Admit: 2022-01-09 | Discharge: 2022-01-09 | Disposition: A | Discharge status: Home / Self Care | Payer: Medicare Other | Source: Ambulatory Visit | Attending: General Surgery | Admitting: General Surgery

## 2022-01-09 ENCOUNTER — Other Ambulatory Visit: Payer: Self-pay

## 2022-01-09 ENCOUNTER — Encounter (HOSPITAL_COMMUNITY): Payer: Self-pay

## 2022-01-09 DIAGNOSIS — Z01818 Encounter for other preprocedural examination: Secondary | ICD-10-CM | POA: Diagnosis not present

## 2022-01-09 LAB — BASIC METABOLIC PANEL
Anion gap: 5 (ref 5–15)
BUN: 16 mg/dL (ref 6–20)
CO2: 28 mmol/L (ref 22–32)
Calcium: 8.6 mg/dL — ABNORMAL LOW (ref 8.9–10.3)
Chloride: 102 mmol/L (ref 98–111)
Creatinine, Ser: 1.13 mg/dL (ref 0.61–1.24)
GFR, Estimated: >60 mL/min (ref >60)
Glucose, Bld: 88 mg/dL (ref 70–99)
Potassium: 5.2 mmol/L — ABNORMAL HIGH (ref 3.5–5.1)
Sodium: 135 mmol/L (ref 135–145)

## 2022-01-09 LAB — CBC
HCT: 45.4 % (ref 39.0–52.0)
Hemoglobin: 14.5 g/dL (ref 13.0–17.0)
MCH: 29.1 pg (ref 26.0–34.0)
MCHC: 31.9 g/dL (ref 30.0–36.0)
MCV: 91 fL (ref 80.0–100.0)
Platelets: 277 10*3/uL (ref 150–400)
RBC: 4.99 MIL/uL (ref 4.22–5.81)
RDW: 14 % (ref 11.5–15.5)
WBC: 9.6 10*3/uL (ref 4.0–10.5)
nRBC: 0 % (ref 0.0–0.2)

## 2022-01-09 LAB — SURGICAL PCR SCREEN
MRSA, PCR: NEGATIVE
Staphylococcus aureus: POSITIVE — AB

## 2022-01-09 NOTE — Progress Notes (Signed)
Anesthesia note:  Bowel prep reminder:  NA  PCP - Dr. Alona Bene Cardiologist -Dr. Lizbeth Bark Other-   Chest x-ray - no EKG - 02/15/21-epic Stress Test - no ECHO - 05/20/21-epic Cardiac Cath - no CABG-no Pacemaker/ICD device last checked:NA  Sleep Study - no CPAP - no  Pt is pre diabetic-NA CBG at PAT visit- Fasting Blood Sugar at home- Checks Blood Sugar _____  Blood Thinner:NA Blood Thinner Instructions: Aspirin Instructions: Last Dose:  Anesthesia review: Yes reason:cardiac history  Patient denies shortness of breath, fever, cough and chest pain at PAT appointment Pt has no SOB with activities. He had a gastric bypass in 2008   Patient verbalized understanding of instructions that were given to them at the PAT appointment. Patient was also instructed that they will need to review over the PAT instructions again at home before surgery.yes

## 2022-01-11 DIAGNOSIS — M5416 Radiculopathy, lumbar region: Secondary | ICD-10-CM | POA: Diagnosis not present

## 2022-01-15 ENCOUNTER — Other Ambulatory Visit: Payer: Self-pay | Admitting: Internal Medicine

## 2022-01-16 ENCOUNTER — Other Ambulatory Visit: Payer: Self-pay

## 2022-01-16 ENCOUNTER — Ambulatory Visit (HOSPITAL_BASED_OUTPATIENT_CLINIC_OR_DEPARTMENT_OTHER): Payer: Medicare Other | Admitting: Certified Registered Nurse Anesthetist

## 2022-01-16 ENCOUNTER — Encounter (HOSPITAL_COMMUNITY): Payer: Self-pay | Admitting: General Surgery

## 2022-01-16 ENCOUNTER — Ambulatory Visit (HOSPITAL_COMMUNITY): Payer: Medicare Other | Admitting: Physician Assistant

## 2022-01-16 ENCOUNTER — Encounter (HOSPITAL_COMMUNITY)
Admission: RE | Disposition: A | Discharge status: Home / Self Care | Payer: Self-pay | Source: Home / Self Care | Attending: General Surgery

## 2022-01-16 ENCOUNTER — Ambulatory Visit (HOSPITAL_COMMUNITY): Payer: Medicare Other

## 2022-01-16 ENCOUNTER — Ambulatory Visit (HOSPITAL_COMMUNITY)
Admission: RE | Admit: 2022-01-16 | Discharge: 2022-01-16 | Disposition: A | Discharge status: Home / Self Care | Payer: Medicare Other | Attending: General Surgery | Admitting: General Surgery

## 2022-01-16 DIAGNOSIS — I350 Nonrheumatic aortic (valve) stenosis: Secondary | ICD-10-CM | POA: Insufficient documentation

## 2022-01-16 DIAGNOSIS — Z9889 Other specified postprocedural states: Secondary | ICD-10-CM | POA: Diagnosis not present

## 2022-01-16 DIAGNOSIS — K8020 Calculus of gallbladder without cholecystitis without obstruction: Secondary | ICD-10-CM | POA: Diagnosis not present

## 2022-01-16 DIAGNOSIS — Z79891 Long term (current) use of opiate analgesic: Secondary | ICD-10-CM | POA: Diagnosis not present

## 2022-01-16 DIAGNOSIS — Z9884 Bariatric surgery status: Secondary | ICD-10-CM | POA: Diagnosis not present

## 2022-01-16 DIAGNOSIS — F418 Other specified anxiety disorders: Secondary | ICD-10-CM | POA: Diagnosis not present

## 2022-01-16 DIAGNOSIS — I2510 Atherosclerotic heart disease of native coronary artery without angina pectoris: Secondary | ICD-10-CM | POA: Insufficient documentation

## 2022-01-16 DIAGNOSIS — G8929 Other chronic pain: Secondary | ICD-10-CM | POA: Insufficient documentation

## 2022-01-16 DIAGNOSIS — K8010 Calculus of gallbladder with chronic cholecystitis without obstruction: Secondary | ICD-10-CM | POA: Diagnosis not present

## 2022-01-16 DIAGNOSIS — I10 Essential (primary) hypertension: Secondary | ICD-10-CM

## 2022-01-16 DIAGNOSIS — Z01818 Encounter for other preprocedural examination: Secondary | ICD-10-CM

## 2022-01-16 HISTORY — PX: CHOLECYSTECTOMY: SHX55

## 2022-01-16 SURGERY — LAPAROSCOPIC CHOLECYSTECTOMY WITH INTRAOPERATIVE CHOLANGIOGRAM
Anesthesia: General | Site: Abdomen

## 2022-01-16 MED ORDER — 0.9 % SODIUM CHLORIDE (POUR BTL) OPTIME
TOPICAL | Status: DC | PRN
  Administered 2022-01-16: 1000 mL

## 2022-01-16 MED ORDER — LACTATED RINGERS IV SOLN: INTRAVENOUS | Status: DC

## 2022-01-16 MED ORDER — LIDOCAINE HCL (PF) 2 % IJ SOLN
INTRAMUSCULAR | Status: AC
  Filled 2022-01-16: qty 5

## 2022-01-16 MED ORDER — HYDROMORPHONE HCL 1 MG/ML IJ SOLN
INTRAMUSCULAR | Status: AC
  Filled 2022-01-16: qty 2

## 2022-01-16 MED ORDER — FENTANYL CITRATE (PF) 250 MCG/5ML IJ SOLN
INTRAMUSCULAR | Status: AC
  Filled 2022-01-16: qty 5

## 2022-01-16 MED ORDER — BUPIVACAINE-EPINEPHRINE (PF) 0.5% -1:200000 IJ SOLN
INTRAMUSCULAR | Status: AC
  Filled 2022-01-16: qty 30

## 2022-01-16 MED ORDER — MIDAZOLAM HCL 2 MG/2ML IJ SOLN
INTRAMUSCULAR | Status: AC
  Filled 2022-01-16: qty 2

## 2022-01-16 MED ORDER — PROPOFOL 10 MG/ML IV BOLUS
INTRAVENOUS | Status: AC
  Filled 2022-01-16: qty 20

## 2022-01-16 MED ORDER — ACETAMINOPHEN 10 MG/ML IV SOLN
INTRAVENOUS | Status: DC | PRN
  Administered 2022-01-16: 1000 mg via INTRAVENOUS

## 2022-01-16 MED ORDER — ACETAMINOPHEN 10 MG/ML IV SOLN
INTRAVENOUS | Status: AC
  Filled 2022-01-16: qty 100

## 2022-01-16 MED ORDER — SODIUM CHLORIDE 0.9 % IV SOLN
INTRAVENOUS | Status: AC
  Filled 2022-01-16: qty 2

## 2022-01-16 MED ORDER — ACETAMINOPHEN 500 MG PO TABS: 1000.0000 mg | ORAL_TABLET | Freq: Three times a day (TID) | ORAL | 0 refills | Status: AC

## 2022-01-16 MED ORDER — HYDROMORPHONE HCL 1 MG/ML IJ SOLN
0.2500 mg | INTRAMUSCULAR | Status: DC | PRN
  Administered 2022-01-16 (×4): 0.5 mg via INTRAVENOUS

## 2022-01-16 MED ORDER — OXYCODONE HCL 5 MG PO TABS
10.0000 mg | ORAL_TABLET | Freq: Once | ORAL | Status: AC
  Administered 2022-01-16: 10 mg via ORAL

## 2022-01-16 MED ORDER — DEXAMETHASONE SODIUM PHOSPHATE 10 MG/ML IJ SOLN
INTRAMUSCULAR | Status: DC | PRN
  Administered 2022-01-16 (×2): 5 mg via INTRAVENOUS

## 2022-01-16 MED ORDER — HEMOSTATIC AGENTS (NO CHARGE) OPTIME
TOPICAL | Status: DC | PRN
  Administered 2022-01-16: 1 via TOPICAL

## 2022-01-16 MED ORDER — CHLORHEXIDINE GLUCONATE 0.12 % MT SOLN
15.0000 mL | Freq: Once | OROMUCOSAL | Status: AC
  Administered 2022-01-16: 15 mL via OROMUCOSAL

## 2022-01-16 MED ORDER — PROMETHAZINE HCL 25 MG/ML IJ SOLN: 6.2500 mg | INTRAMUSCULAR | Status: DC | PRN

## 2022-01-16 MED ORDER — SODIUM CHLORIDE 0.9 % IV SOLN: INTRAVENOUS | Status: DC | PRN

## 2022-01-16 MED ORDER — KETAMINE HCL 10 MG/ML IJ SOLN
INTRAMUSCULAR | Status: DC | PRN
  Administered 2022-01-16: 20 mg via INTRAVENOUS

## 2022-01-16 MED ORDER — FENTANYL CITRATE (PF) 100 MCG/2ML IJ SOLN
INTRAMUSCULAR | Status: DC | PRN
  Administered 2022-01-16 (×2): 100 ug via INTRAVENOUS
  Administered 2022-01-16: 50 ug via INTRAVENOUS

## 2022-01-16 MED ORDER — MEPERIDINE HCL 50 MG/ML IJ SOLN: 6.2500 mg | INTRAMUSCULAR | Status: DC | PRN

## 2022-01-16 MED ORDER — ONDANSETRON HCL 4 MG/2ML IJ SOLN
INTRAMUSCULAR | Status: AC
  Filled 2022-01-16: qty 2

## 2022-01-16 MED ORDER — MIDAZOLAM HCL 5 MG/5ML IJ SOLN
INTRAMUSCULAR | Status: DC | PRN
  Administered 2022-01-16: 2 mg via INTRAVENOUS

## 2022-01-16 MED ORDER — DEXAMETHASONE SODIUM PHOSPHATE 10 MG/ML IJ SOLN
INTRAMUSCULAR | Status: AC
  Filled 2022-01-16: qty 1

## 2022-01-16 MED ORDER — ROCURONIUM BROMIDE 10 MG/ML (PF) SYRINGE
PREFILLED_SYRINGE | INTRAVENOUS | Status: DC | PRN
  Administered 2022-01-16: 10 mg via INTRAVENOUS
  Administered 2022-01-16: 100 mg via INTRAVENOUS

## 2022-01-16 MED ORDER — ONDANSETRON HCL 4 MG/2ML IJ SOLN
INTRAMUSCULAR | Status: DC | PRN
  Administered 2022-01-16: 4 mg via INTRAVENOUS

## 2022-01-16 MED ORDER — ROCURONIUM BROMIDE 10 MG/ML (PF) SYRINGE
PREFILLED_SYRINGE | INTRAVENOUS | Status: AC
  Filled 2022-01-16: qty 10

## 2022-01-16 MED ORDER — SODIUM CHLORIDE 0.9 % IV SOLN
2.0000 g | Freq: Two times a day (BID) | INTRAVENOUS | Status: DC
  Administered 2022-01-16: 2 g via INTRAVENOUS

## 2022-01-16 MED ORDER — SPY AGENT GREEN - (INDOCYANINE FOR INJECTION)
INTRAMUSCULAR | Status: DC | PRN
  Administered 2022-01-16: 1.5 mL via INTRAVENOUS

## 2022-01-16 MED ORDER — OXYCODONE HCL 5 MG PO TABS: 5.0000 mg | ORAL_TABLET | Freq: Once | ORAL | Status: DC | PRN

## 2022-01-16 MED ORDER — GLUCAGON HCL RDNA (DIAGNOSTIC) 1 MG IJ SOLR
INTRAMUSCULAR | Status: AC
  Filled 2022-01-16: qty 1

## 2022-01-16 MED ORDER — EPHEDRINE 5 MG/ML INJ
INTRAVENOUS | Status: AC
  Filled 2022-01-16: qty 5

## 2022-01-16 MED ORDER — LACTATED RINGERS IR SOLN
Status: DC | PRN
  Administered 2022-01-16: 1000 mL

## 2022-01-16 MED ORDER — LIDOCAINE 2% (20 MG/ML) 5 ML SYRINGE
INTRAMUSCULAR | Status: DC | PRN
  Administered 2022-01-16: 100 mg via INTRAVENOUS

## 2022-01-16 MED ORDER — AMISULPRIDE (ANTIEMETIC) 5 MG/2ML IV SOLN: 10.0000 mg | Freq: Once | INTRAVENOUS | Status: DC | PRN

## 2022-01-16 MED ORDER — EPHEDRINE SULFATE-NACL 50-0.9 MG/10ML-% IV SOSY
PREFILLED_SYRINGE | INTRAVENOUS | Status: DC | PRN
  Administered 2022-01-16: 10 mg via INTRAVENOUS
  Administered 2022-01-16: 5 mg via INTRAVENOUS

## 2022-01-16 MED ORDER — HYDROMORPHONE HCL 1 MG/ML IJ SOLN
INTRAMUSCULAR | Status: AC
  Filled 2022-01-16: qty 1

## 2022-01-16 MED ORDER — KETOROLAC TROMETHAMINE 30 MG/ML IJ SOLN
INTRAMUSCULAR | Status: AC
  Filled 2022-01-16: qty 1

## 2022-01-16 MED ORDER — SUGAMMADEX SODIUM 200 MG/2ML IV SOLN
INTRAVENOUS | Status: DC | PRN
  Administered 2022-01-16: 200 mg via INTRAVENOUS

## 2022-01-16 MED ORDER — ORAL CARE MOUTH RINSE: 15.0000 mL | Freq: Once | OROMUCOSAL | Status: AC

## 2022-01-16 MED ORDER — OXYCODONE HCL 5 MG/5ML PO SOLN: 5.0000 mg | Freq: Once | ORAL | Status: DC | PRN

## 2022-01-16 MED ORDER — GLUCAGON HCL RDNA (DIAGNOSTIC) 1 MG IJ SOLR
INTRAMUSCULAR | Status: DC | PRN
  Administered 2022-01-16: 1 mg via INTRAVENOUS

## 2022-01-16 MED ORDER — BUPIVACAINE-EPINEPHRINE 0.5% -1:200000 IJ SOLN
INTRAMUSCULAR | Status: DC | PRN
  Administered 2022-01-16: 30 mL

## 2022-01-16 MED ORDER — OXYCODONE HCL 5 MG PO TABS
ORAL_TABLET | ORAL | Status: AC
  Filled 2022-01-16: qty 2

## 2022-01-16 SURGICAL SUPPLY — 55 items
ADH SKN CLS APL DERMABOND .7 (GAUZE/BANDAGES/DRESSINGS)
APL PRP STRL LF DISP 70% ISPRP (MISCELLANEOUS) ×1
APL SRG 38 LTWT LNG FL B (MISCELLANEOUS)
APPLICATOR ARISTA FLEXITIP XL (MISCELLANEOUS) IMPLANT
APPLIER CLIP 5 13 M/L LIGAMAX5 (MISCELLANEOUS)
APPLIER CLIP ROT 10 11.4 M/L (STAPLE) ×1
APR CLP MED LRG 11.4X10 (STAPLE) ×1
APR CLP MED LRG 5 ANG JAW (MISCELLANEOUS)
BAG COUNTER SPONGE SURGICOUNT (BAG) IMPLANT
BAG SPEC RTRVL 10 TROC 200 (ENDOMECHANICALS) ×1
BAG SPNG CNTER NS LX DISP (BAG) ×1
CABLE HIGH FREQUENCY MONO STRZ (ELECTRODE) ×1 IMPLANT
CHLORAPREP W/TINT 26 (MISCELLANEOUS) ×1 IMPLANT
CLIP APPLIE 5 13 M/L LIGAMAX5 (MISCELLANEOUS) IMPLANT
CLIP APPLIE ROT 10 11.4 M/L (STAPLE) IMPLANT
CLIP LIGATING HEMO O LOK GREEN (MISCELLANEOUS) IMPLANT
COVER MAYO STAND XLG (MISCELLANEOUS) IMPLANT
COVER SURGICAL LIGHT HANDLE (MISCELLANEOUS) ×1 IMPLANT
DERMABOND ADVANCED .7 DNX12 (GAUZE/BANDAGES/DRESSINGS) IMPLANT
DRAPE C-ARM 42X120 X-RAY (DRAPES) IMPLANT
DRSG TEGADERM 2-3/8X2-3/4 SM (GAUZE/BANDAGES/DRESSINGS) ×3 IMPLANT
DRSG TEGADERM 4X4.75 (GAUZE/BANDAGES/DRESSINGS) ×1 IMPLANT
ELECT REM PT RETURN 15FT ADLT (MISCELLANEOUS) ×1 IMPLANT
GAUZE SPONGE 2X2 8PLY STRL LF (GAUZE/BANDAGES/DRESSINGS) ×1 IMPLANT
GLOVE BIO SURGEON STRL SZ7.5 (GLOVE) ×1 IMPLANT
GLOVE INDICATOR 8.0 STRL GRN (GLOVE) ×1 IMPLANT
GOWN STRL REUS W/ TWL XL LVL3 (GOWN DISPOSABLE) ×1 IMPLANT
GOWN STRL REUS W/TWL XL LVL3 (GOWN DISPOSABLE) ×1
GRASPER SUT TROCAR 14GX15 (MISCELLANEOUS) IMPLANT
HEMOSTAT ARISTA ABSORB 3G PWDR (HEMOSTASIS) IMPLANT
HEMOSTAT SNOW SURGICEL 2X4 (HEMOSTASIS) IMPLANT
IRRIG SUCT STRYKERFLOW 2 WTIP (MISCELLANEOUS) ×1
IRRIGATION SUCT STRKRFLW 2 WTP (MISCELLANEOUS) ×1 IMPLANT
KIT BASIN OR (CUSTOM PROCEDURE TRAY) ×1 IMPLANT
KIT TURNOVER KIT A (KITS) IMPLANT
L-HOOK LAP DISP 36CM (ELECTROSURGICAL)
LHOOK LAP DISP 36CM (ELECTROSURGICAL) IMPLANT
POUCH RETRIEVAL ECOSAC 10 (ENDOMECHANICALS) ×1 IMPLANT
POUCH RETRIEVAL ECOSAC 10MM (ENDOMECHANICALS) ×1
SCISSORS LAP 5X35 DISP (ENDOMECHANICALS) ×1 IMPLANT
SET CHOLANGIOGRAPH MIX (MISCELLANEOUS) IMPLANT
SET TUBE SMOKE EVAC HIGH FLOW (TUBING) ×1 IMPLANT
SLEEVE Z-THREAD 5X100MM (TROCAR) ×2 IMPLANT
SPIKE FLUID TRANSFER (MISCELLANEOUS) ×1 IMPLANT
STRIP CLOSURE SKIN 1/2X4 (GAUZE/BANDAGES/DRESSINGS) ×1 IMPLANT
SUT MNCRL AB 4-0 PS2 18 (SUTURE) ×1 IMPLANT
SUT VIC AB 0 UR5 27 (SUTURE) IMPLANT
SUT VICRYL 0 TIES 12 18 (SUTURE) IMPLANT
SUT VICRYL 0 UR6 27IN ABS (SUTURE) IMPLANT
TOWEL OR 17X26 10 PK STRL BLUE (TOWEL DISPOSABLE) ×1 IMPLANT
TOWEL OR NON WOVEN STRL DISP B (DISPOSABLE) ×1 IMPLANT
TRAY LAPAROSCOPIC (CUSTOM PROCEDURE TRAY) ×1 IMPLANT
TROCAR 11X100 Z THREAD (TROCAR) IMPLANT
TROCAR BALLN 12MMX100 BLUNT (TROCAR) ×1 IMPLANT
TROCAR Z-THREAD OPTICAL 5X100M (TROCAR) ×1 IMPLANT

## 2022-01-16 NOTE — Anesthesia Procedure Notes (Signed)
Procedure Name: Intubation Date/Time: 01/16/2022 10:48 AM  Performed by: Gerald Leitz, CRNAPre-anesthesia Checklist: Patient identified, Patient being monitored, Timeout performed, Emergency Drugs available and Suction available Patient Re-evaluated:Patient Re-evaluated prior to induction Oxygen Delivery Method: Circle system utilized Preoxygenation: Pre-oxygenation with 100% oxygen Induction Type: IV induction Ventilation: Mask ventilation without difficulty Laryngoscope Size: Mac and 3 Grade View: Grade I Tube type: Oral Tube size: 7.5 mm Number of attempts: 1 Airway Equipment and Method: Stylet Placement Confirmation: ETT inserted through vocal cords under direct vision, positive ETCO2 and breath sounds checked- equal and bilateral Secured at: 21 cm Tube secured with: Tape Dental Injury: Teeth and Oropharynx as per pre-operative assessment

## 2022-01-16 NOTE — Interval H&P Note (Signed)
History and Physical Interval Note:  01/16/2022 10:34 AM  Bradley [name]  has presented today for surgery, with the diagnosis of SYMPTOMATIC CHOLELITHIASIS.  The various methods of treatment have been discussed with the patient and family. After consideration of risks, benefits and other options for treatment, the patient has consented to  Procedure(s): LAPAROSCOPIC CHOLECYSTECTOMY WITH INTRAOPERATIVE CHOLANGIOGRAM AND ICG DYE (N/A) as a surgical intervention.  The patient's history has been reviewed, patient examined, no change in status, stable for surgery.  I have reviewed the patient's chart and labs.  Questions were answered to the patient's satisfaction.    Discussed [facility] resident involvement with case  Leighton Ruff. Redmond Pulling, MD, [place], Bariatric, & Minimally Invasive Surgery [facility],  [address]   Greer Pickerel

## 2022-01-16 NOTE — Op Note (Addendum)
Bradley [name] [id number] [birth year] 01/16/2022  Laparoscopic Cholecystectomy with intra-operative cholangiogram and near infrared cholangiography Procedure Note  Indications: This patient presents with symptomatic gallbladder disease and will undergo laparoscopic cholecystectomy.  Patient also has a history of gastric bypass at [facility]  He has also had a panniculectomy with umbilical hernia repair.  Pre-operative Diagnosis: symptomatic cholelithiasis  Post-operative Diagnosis: same, h/o mini gastric bypass  Surgeon: Greer Pickerel MD FACS  Assistants: Winfield Rast MD PGY-3  Anesthesia: General endotracheal anesthesia   Procedure Details  The patient was seen again in the Holding Room. The risks, benefits, complications, treatment options, and expected outcomes were discussed with the patient. The possibilities of reaction to medication, pulmonary aspiration, perforation of viscus, bleeding, recurrent infection, finding a normal gallbladder, the need for additional procedures, failure to diagnose a condition, the possible need to convert to an open procedure, and creating a complication requiring transfusion or operation were discussed with the patient. The likelihood of improving the patient's symptoms with return to their baseline status is good.  The patient and/or family concurred with the proposed plan, giving informed consent. The site of surgery properly noted. The patient was taken to Operating Room, identified as Bradley [name] and the procedure verified as Laparoscopic Cholecystectomy with Intraoperative Cholangiogram. A Time Out was held and the above information confirmed. Antibiotic prophylaxis was administered.  Patient was given ICG dye preoperatively.  Prior to the induction of general anesthesia, antibiotic prophylaxis was administered. General endotracheal anesthesia was then administered and tolerated well. After the induction, the abdomen was prepped with Chloraprep and  draped in the sterile fashion. The patient was positioned in the supine position.  Because of his prior panniculectomy and umbilical hernia repair I decided to gain access to the abdomen the left upper quadrant.  A small incision was made just to the left of the midline of the subcostal margin.  A 0 degree 5 mm laparoscope through a 5 mm trocar was carefully advanced through all layers of abdominal wall and the abdominal cavity was entered.  Pneumoperitoneum was smoothly established up to a patient pressure of 15 mmHg.  The laparoscope was advanced and the abdominal cavity was surveilled.  There was no evidence of injury to surrounding structures.  The patient had evidence of a small prior umbilical hernia mesh repair.  There were no adhesions to the anterior abdominal wall.  There is no evidence of injury to surrounding viscera.  Two 5-mm ports were placed in the right upper quadrant.  The optical entry trocar was exchanged for an 11 mm trocar.  All skin incisions were infiltrated with a local anesthetic agent before making the incision and placing the trocars.   We positioned the patient in reverse Trendelenburg, tilted slightly to the patient's left.  The gallbladder was identified, the fundus grasped and retracted cephalad.  It had some evidence of hepatic steatosis.  Upon retraction of the gallbladder toward the right shoulder.  There is a tear in the liver capsule at the falciform ligament where it joined the liver.  This was not an unknown common occurrence.  Hemostasis was achieved with electrocautery.  Adhesions were lysed bluntly and with the electrocautery where indicated, taking care not to injure any adjacent organs or viscus. The infundibulum was grasped and retracted laterally, exposing the peritoneum overlying the triangle of Calot. This was then divided and exposed in a blunt fashion. A critical view of the cystic duct and cystic artery was obtained.  Using the [employer] camera system  near  infrared cholangiography was performed.  Fluorescent activity was seen within the gallbladder, liver, common bile duct and the cystic duct.  This served as secondary confirmation of our anatomy.  The cystic duct had been clearly identified and bluntly dissected circumferentially. The cystic artery which had been identified & dissected free was ligated with clips and divided as well. The cystic duct was ligated with a clip distally.   An incision was made in the cystic duct and the [facility] cholangiogram catheter introduced. The catheter was secured using a clip. A cholangiogram was then obtained which showed good visualization of the distal and proximal biliary tree with no sign of filling defects or obstruction.  On the initial run her to it did not appear that we had contrast going into the duodenum.  The patient had hardware in his back from prior back surgery.  We changed the angle of the C arm.  Glucagon was administered.  A final cholangiogram was performed which demonstrated contrast flowed easily into the duodenum. The catheter was then removed.   The cystic duct was then ligated with clips and divided.  The gallbladder was dissected from the liver bed in retrograde fashion with the electrocautery.  Posterior wall the gallbladder was very thin and there was some bile spillage.  The gallbladder was removed and placed in an Ecco sac. The liver bed was irrigated and inspected. Hemostasis was achieved with the electrocautery. Copious irrigation was utilized and was repeatedly aspirated until clear.  The gallbladder and Ecco sac were then removed through the left upper quadrant port site after enlarging the fascial defect.  Trocar was replaced and a piece of surgical snow was placed over the area of the liver where it joined the falciform where the liver capsule had torn.  Again there was no evidence of bleeding at this point.  A figure-of-eight 0 Vicryl was placed at the left upper quadrant fascial site to close  the fascia with a PMI suture passer..  An additional interrupted 0 Vicryl was placed using the PMI suture passer  We again inspected the right upper quadrant for hemostasis.  The left upper quadrant closure was inspected and there was no air leak and nothing trapped within the closure.  I visualized the patient's bariatric anatomy.  The patient had a long sleeve that came down to the level of the transverse colon.  I visualized his gastrojejunostomy.  It was a loop.  This was can with a mini gastric bypass anatomy.  Pneumoperitoneum was released as we removed the trocars.  4-0 Monocryl was used to close the skin by the resident after I left the OR.    Dermabond was applied. The patient was then extubated and brought to the recovery room in stable condition. Instrument, sponge, and needle counts were correct at closure and at the conclusion of the case.   Findings: Probable Cholecystitis with Cholelithiasis Mini gastric bypass anatomay - not roux en y anatomy +critical view +snow  Estimated Blood Loss: Minimal         Drains: none         Specimens: Gallbladder           Complications: None; patient tolerated the procedure well.         Disposition: PACU - hemodynamically stable.         Condition: stable  I was personally present, scrubbed during the entire procedure except for subcu skin closure but was immediately available , as documented in my operative note.  Leighton Ruff. Redmond Pulling, MD, FACS General, Bariatric, & Minimally Invasive Surgery [facility],  [place]

## 2022-01-16 NOTE — Anesthesia Preprocedure Evaluation (Signed)
Anesthesia Evaluation  Patient identified by MRN, date of birth, ID band Patient awake    Reviewed: Allergy & Precautions, H&P , NPO status , Patient's Chart, lab work & pertinent test results, reviewed documented beta blocker date and time   Airway Mallampati: I  TM Distance: >3 FB Neck ROM: full    Dental no notable dental hx. (+) Edentulous Upper, Edentulous Lower   Pulmonary asthma    Pulmonary exam normal breath sounds clear to auscultation       Cardiovascular Exercise Tolerance: Good hypertension, Pt. on medications + CAD  + Valvular Problems/Murmurs AS  Rhythm:Regular Rate:Normal + Systolic murmurs ECHO 2/26 1. Left ventricular ejection fraction, by estimation, is 55 to 60%. The  left ventricle has normal function. The left ventricle has no regional  wall motion abnormalities. There is mild left ventricular hypertrophy.  Left ventricular diastolic parameters  were normal.  2. Right ventricular systolic function is normal. The right ventricular  size is normal. There is mildly elevated pulmonary artery systolic  pressure.  3. The mitral valve is grossly normal. Mild mitral valve regurgitation.  4. AV is thickened, calcified with very mildly restricted motion. Peak  and mean gradients thruogh the valve are 23 and 11 mm Hg respectively  consistent with mild AS.Marland Kitchen The aortic valve is tricuspid. Aortic valve  regurgitation is not visualized.     Neuro/Psych  PSYCHIATRIC DISORDERS Anxiety Depression    negative neurological ROS     GI/Hepatic hiatal hernia,GERD  Medicated and Controlled,,EGD 05/03/18: IMPRESSION: - LA Grade A reflux esophagitis. - Gastric bypass with a medium-sized pouch and intact staple line. Gastrojejunal anastomosis characterized by erosion. Mild gastritis with erosions and adherent heme.    Endo/Other  negative endocrine ROS    Renal/GU negative Renal ROS  negative genitourinary    Musculoskeletal  (+) Arthritis , Osteoarthritis,    Abdominal   Peds  Hematology   Anesthesia Other Findings   Reproductive/Obstetrics negative OB ROS                             Anesthesia Physical Anesthesia Plan  ASA: III  Anesthesia Plan: General   Post-op Pain Management: Dilaudid IV and Ofirmev IV (intra-op)*   Induction: Intravenous  PONV Risk Score and Plan: 2 and Ondansetron, Treatment may vary due to age or medical condition and Midazolam  Airway Management Planned: Oral ETT  Additional Equipment:   Intra-op Plan:   Post-operative Plan: Extubation in OR  Informed Consent: I have reviewed the patients History and Physical, chart, labs and discussed the procedure including the risks, benefits and alternatives for the proposed anesthesia with the patient or authorized representative who has indicated his/her understanding and acceptance.     Dental Advisory Given  Plan Discussed with: CRNA and Anesthesiologist  Anesthesia Plan Comments: ( )        Anesthesia Quick Evaluation

## 2022-01-16 NOTE — Anesthesia Postprocedure Evaluation (Signed)
Anesthesia Post Note  Patient: Bradley [name]  Procedure(s) Performed: LAPAROSCOPIC CHOLECYSTECTOMY WITH INTRAOPERATIVE CHOLANGIOGRAM AND ICG DYE (Abdomen)     Patient location during evaluation: PACU Anesthesia Type: General Level of consciousness: awake and alert Pain management: pain level controlled Vital Signs Assessment: post-procedure vital signs reviewed and stable Respiratory status: spontaneous breathing, nonlabored ventilation and respiratory function stable Cardiovascular status: blood pressure returned to baseline and stable Postop Assessment: no apparent nausea or vomiting Anesthetic complications: no   No notable events documented.  Last Vitals:  Vitals:   01/16/22 1300 01/16/22 1315  BP: 136/78 (!) 145/80  Pulse: 74 77  Resp: 13 12  Temp:    SpO2: 93% 91%    Last Pain:  Vitals:   01/16/22 1300  TempSrc:   PainSc: 10-Worst pain ever                 Lynda Rainwater

## 2022-01-16 NOTE — H&P (Signed)
REFERRING PHYSICIAN: [name], MD  PROVIDER: Lash Matulich Leanne Chang, MD  MRN: [id number] DOB: 1971-01-11 DATE OF ENCOUNTER: 01/05/2022  Subjective   Chief Complaint: New Consultation (Gallstones)   History of Present Illness: Bradley [name] is a 51 y.o. male who is seen today as an office consultation at the request of Dr. Ronnald Ramp for evaluation of New Consultation (Gallstones) .   Is accompanied by his wife. He states that he has had increasing episodes of upper abdominal pain and points to his right side. He states the pain will last a few hours and then ease up. He saw his PCP who ordered an ultrasound and labs. He states that during his ultrasound the tech triggered an attack. He states that he gets sharp stabbing pain for about 15 minutes. He will have episodes generally at least once a week for the past month however recently has been increasing in frequency. Is not always associated with nausea. The pain generally lasts about 1 to 2 hours. He has a daily bowel movement. The pain will generally start in the right upper quadrant and radiate to his epigastrium but sometimes it feels it extends across both sides of his upper abdomen. No NSAID use. He has a remote history of a reported gastric bypass in [facility] in 2008. He has had a panniculectomy he said with hernia surgery with mesh in his lower abdomen. Takes a multivitamin. He is on chronic oxycodone managed by [facility] for chronic back pain and joint pain. Denies any pain with immediate oral intake. Tobacco use.    Review of Systems: A complete review of systems was obtained from the patient. I have reviewed this information and discussed as appropriate with the patient. See HPI as well for other ROS.  ROS   Medical History: Past Medical History:  Diagnosis Date  Arthritis  Hypertension   Patient Active Problem List  Diagnosis  Bariatric surgery status   Past Surgical History:  Procedure Laterality Date  Ankle  Surgery Left  Back Fusion/Surgery  x 4  Elbow Surgery Right  Gastric Bypass Surgery  JOINT REPLACEMENT  Knee    Allergies  Allergen Reactions  Codeine Hives and Itching  Hives and itching  Oxycodone-Acetaminophen Itching   Current Outpatient Medications on File Prior to Visit  Medication Sig Dispense Refill  gabapentin (NEURONTIN) 300 MG capsule Take 300 mg by mouth 3 (three) times daily  multivitamin tablet Take 1 tablet by mouth once daily  oxyCODONE (OXYIR) 10 mg immediate release tablet Take by mouth  thiamine (VITAMIN B-1) 50 MG tablet Take 50 mg by mouth once daily  zinc gluconate 50 mg tablet Take 50 mg by mouth once daily   No current facility-administered medications on file prior to visit.   History reviewed. No pertinent family history.   Social History   Tobacco Use  Smoking Status Never  Smokeless Tobacco Never    Social History   Socioeconomic History  Marital status: Married  Tobacco Use  Smoking status: Never  Smokeless tobacco: Never  Substance and Sexual Activity  Alcohol use: Yes  Drug use: Never   Objective:   Vitals:  01/05/22 1449  BP: (!) 170/100  Pulse: 86  Temp: 36.8 C (98.2 F)  SpO2: 98%  Weight: (!) 112.6 kg (248 lb 3.2 oz)  Height: 177.8 cm (5' 10" )   Body mass index is 35.61 kg/m.  Constitutional: NAD; conversant; no deformities Eyes: Moist conjunctiva; no lid lag; anicteric; PERRL Neck: Trachea midline; no thyromegaly Lungs:  Normal respiratory effort; no tactile fremitus CV: RRR; no palpable thrills; no pitting edema GI: Abd soft, nondistended, very subtle tenderness to deep palpation. Old panniculectomy scar; no palpable hepatosplenomegaly MSK: Normal gait; no clubbing/cyanosis Psychiatric: Appropriate affect; alert and oriented x3 Lymphatic: No palpable cervical or axillary lymphadenopathy Skin: No rash, lesions or jaundice  Labs, Imaging and Diagnostic Testing: PCP note 12/12/21  EXAM: ULTRASOUND ABDOMEN  LIMITED RIGHT UPPER QUADRANT10/13/23  COMPARISON: 04/25/2018 ultrasound and prior studies  FINDINGS: Gallbladder:  Multiple mobile gallstones are identified, the largest measuring 1.4 cm. There is no evidence of gallbladder wall thickening, pericholecystic fluid or sonographic Murphy sign.  Common bile duct:  Diameter: 6 mm. There is no evidence of intrahepatic or extrahepatic biliary dilatation.  Liver:  Increased hepatic echogenicity identified compatible with hepatic steatosis. No other abnormalities are noted. Portal vein is patent on color Doppler imaging with normal direction of blood flow towards the liver.  Other: None.  IMPRESSION: 1. Cholelithiasis without evidence of acute cholecystitis. 2. Hepatic steatosis. 3. No biliary dilatation.  LABS 12/13/21 nml cbc, nml lfts, zinc low at 40  Assessment and Plan:    Diagnoses and all orders for this visit:  Symptomatic cholelithiasis  Sleeve-bypass Comments: 2008 at [facility]  Status post panniculectomy Comments: with mesh    I believe the patient's symptoms are consistent with gallbladder disease.  We discussed gallbladder disease. The patient was given Neurosurgeon. We discussed non-operative and operative management. We discussed the signs & symptoms of acute cholecystitis  I discussed laparoscopic cholecystectomy with possible IOC in detail. The patient was given educational material as well as diagrams detailing the procedure. We discussed the risks and benefits of a laparoscopic cholecystectomy including, but not limited to bleeding, infection, injury to surrounding structures such as the intestine or liver, bile leak, retained gallstones, need to convert to an open procedure, prolonged diarrhea, blood clots such as DVT, common bile duct injury, anesthesia risks, and possible need for additional procedures. We discussed the typical post-operative recovery course. I explained that the likelihood of  improvement of their symptoms is good.  Do not think his symptoms are consistent with gastritis or marginal ulcer. Since he had his "gastric bypass "at [facility] in 2008 it is more than likely he had a mini gastric bypass and not a true Roux-en-Y.  We did discuss that his prior hernia repair with mesh may have resulted in some scar tissue but hopefully it is in the lower abdomen and we can avoid it. We will do optical entry in the left upper quadrant.  He will meet with the surgery scheduler to get scheduled for laparoscopic cholecystectomy with ICG dye and cholangiogram  Moderate level of medical decision making. He has had exacerbation of symptomatic cholelithiasis, requiring moderate level of medical data review  No follow-ups on file.  Veroncia Jezek Leanne Chang, MD  General, Minimally Invasive, & Bariatric Surgery

## 2022-01-16 NOTE — Discharge Instructions (Signed)
[facility] LAPAROSCOP[facility]: POST OP INSTRUCTIONS Always review your discharge instruction sheet given to you by the facility where your surgery was performed. IF YOU HAVE DISABILITY OR FAMILY LEAVE FORMS, YOU MUST BRING THEM TO THE OFFICE FOR PROCESSING.   DO NOT GIVE THEM TO YOUR DOCTOR.  PAIN CONTROL  First take acetaminophen (Tylenol) AND/or ibuprofen (Advil) to control your pain after surgery.  Follow directions on package.  Taking acetaminophen (Tylenol) regularly after surgery will help to control your pain and lower the amount of prescription pain medication you may need.  You should not take more than 3,000 mg (3 grams) of acetaminophen (Tylenol) in 24 hours.  You should not take ibuprofen (Advil), aleve, motrin, naprosyn or other NSAIDS if you have a history of stomach ulcers or chronic kidney disease.  A prescription for pain medication may be given to you upon discharge.  Take your pain medication as prescribed, if you still have uncontrolled pain after taking acetaminophen (Tylenol) or ibuprofen (Advil). Use ice packs to help control pain. If you need a refill on your pain medication, please contact your pharmacy.  They will contact our office to request authorization. Prescriptions will not be filled after 5pm or on week-ends.  HOME MEDICATIONS Take your usually prescribed medications unless otherwise directed.  DIET You should follow a light diet the first few days after arrival home.  Be sure to include lots of fluids daily. Avoid fatty, fried foods.   CONSTIPATION It is common to experience some constipation after surgery and if you are taking pain medication.  Increasing fluid intake and taking a stool softener (such as Colace) will usually help or prevent this problem from occurring.  A mild laxative (Milk of Magnesia or Miralax) should be taken according to package instructions if there are no bowel movements after 48 hours.  WOUND/INCISION  CARE Most patients will experience some swelling and bruising in the area of the incisions.  Ice packs will help.  Swelling and bruising can take several days to resolve.  Unless discharge instructions indicate otherwise, follow guidelines below  STERI-STRIPS - you may remove your outer bandages 48 hours after surgery, and you may shower at that time.  You have steri-strips (small skin tapes) in place directly over the incision.  These strips should be left on the skin for 7-10 days.   DERMABOND/SKIN GLUE - you may shower in 24 hours.  The glue will flake off over the next 2-3 weeks. Any sutures or staples will be removed at the office during your follow-up visit.  ACTIVITIES You may resume regular (light) daily activities beginning the next day--such as daily self-care, walking, climbing stairs--gradually increasing activities as tolerated.  You may have sexual intercourse when it is comfortable.  Refrain from any heavy lifting or straining until approved by your doctor. You may drive when you are no longer taking prescription pain medication, you can comfortably wear a seatbelt, and you can safely maneuver your car and apply brakes.  FOLLOW-UP You should see your doctor in the office for a follow-up appointment approximately 2-3 weeks after your surgery.  You should have been given your post-op/follow-up appointment when your surgery was scheduled.  If you did not receive a post-op/follow-up appointment, make sure that you call for this appointment within a day or two after you arrive home to insure a convenient appointment time.  OTHER INSTRUCTIONS NO NSAIDS  WHEN TO CALL YOUR DOCTOR: Fever over 101.0 Inability to urinate Continued bleeding from  incision. Increased pain, redness, or drainage from the incision. Increasing abdominal pain  The clinic staff is available to answer your questions during regular business hours.  Please don't hesitate to call and ask to speak to one of the nurses  for clinical concerns.  If you have a medical emergency, go to the nearest emergency room or call 911.  A surgeon from [facility] is always on call at the hospital. [address] [place], [place], [place]  [address] ? [address], [place], [place]   [address] [phone] ? [phone] ? FAX [phone] Web site: [email or web address]

## 2022-01-16 NOTE — Transfer of Care (Signed)
Immediate Anesthesia Transfer of Care Note  Patient: Bradley [name]  Procedure(s) Performed: Procedure(s): LAPAROSCOPIC CHOLECYSTECTOMY WITH INTRAOPERATIVE CHOLANGIOGRAM AND ICG DYE (N/A)  Patient Location: PACU  Anesthesia Type:General  Level of Consciousness: Alert, Awake, Oriented  Airway & Oxygen Therapy: Patient Spontanous Breathing  Post-op Assessment: Report given to RN  Post vital signs: Reviewed and stable  Last Vitals:  Vitals:   01/16/22 0925 01/16/22 1235  BP: (!) 141/99 135/73  Pulse: 67 (!) 58  Resp: 18 15  Temp: 37.1 C   SpO2: 37% 858%    Complications: No apparent anesthesia complications

## 2022-01-16 NOTE — Progress Notes (Signed)
Page to Dr. Redmond Pulling for pre-op orders

## 2022-01-17 ENCOUNTER — Encounter (HOSPITAL_COMMUNITY): Payer: Self-pay | Admitting: General Surgery

## 2022-01-17 LAB — SURGICAL PATHOLOGY, GROSS ONLY (NOT ARMC)

## 2022-02-06 ENCOUNTER — Telehealth: Payer: Self-pay | Admitting: Internal Medicine

## 2022-02-06 NOTE — Telephone Encounter (Signed)
Patient needs new prescriptions for all of his medications sent to [facility]. He states that [facility] is refusing to transfer his medications. I updated his preferred pharmacy in the chart. If you have questions, patient can be reached at [phone].  [place], [place]   Phone: [phone]  Fax: [phone]

## 2022-02-07 ENCOUNTER — Other Ambulatory Visit: Payer: Self-pay

## 2022-02-07 ENCOUNTER — Other Ambulatory Visit: Payer: Self-pay | Admitting: Internal Medicine

## 2022-02-07 DIAGNOSIS — E60 Dietary zinc deficiency: Secondary | ICD-10-CM

## 2022-02-07 DIAGNOSIS — Z9884 Bariatric surgery status: Secondary | ICD-10-CM

## 2022-02-07 DIAGNOSIS — D528 Other folate deficiency anemias: Secondary | ICD-10-CM

## 2022-02-07 DIAGNOSIS — I10 Essential (primary) hypertension: Secondary | ICD-10-CM

## 2022-02-07 DIAGNOSIS — D508 Other iron deficiency anemias: Secondary | ICD-10-CM

## 2022-02-07 DIAGNOSIS — N5201 Erectile dysfunction due to arterial insufficiency: Secondary | ICD-10-CM

## 2022-02-07 MED ORDER — PHENTERMINE HCL 37.5 MG PO CAPS: 37.5000 mg | ORAL_CAPSULE | ORAL | 0 refills | Status: DC

## 2022-02-07 MED ORDER — FUSION 65-65-25-30 MG PO CAPS: 1.0000 | ORAL_CAPSULE | Freq: Two times a day (BID) | ORAL | 1 refills | Status: DC

## 2022-02-07 MED ORDER — IRBESARTAN 300 MG PO TABS: 300.0000 mg | ORAL_TABLET | Freq: Every day | ORAL | 0 refills | Status: DC

## 2022-02-07 MED ORDER — SILDENAFIL CITRATE 20 MG PO TABS: 100.0000 mg | ORAL_TABLET | ORAL | 3 refills | Status: DC

## 2022-02-07 MED ORDER — ZINC GLUCONATE 50 MG PO TABS: ORAL_TABLET | ORAL | 1 refills | Status: DC

## 2022-02-07 MED ORDER — FOLIC ACID 1 MG PO TABS: ORAL_TABLET | ORAL | 1 refills | Status: DC

## 2022-02-20 DIAGNOSIS — G894 Chronic pain syndrome: Secondary | ICD-10-CM | POA: Diagnosis not present

## 2022-02-20 DIAGNOSIS — M545 Low back pain, unspecified: Secondary | ICD-10-CM | POA: Diagnosis not present

## 2022-02-22 ENCOUNTER — Other Ambulatory Visit: Payer: Self-pay | Admitting: Internal Medicine

## 2022-02-22 DIAGNOSIS — Z9884 Bariatric surgery status: Secondary | ICD-10-CM

## 2022-02-22 MED ORDER — PHENTERMINE HCL 37.5 MG PO CAPS: 37.5000 mg | ORAL_CAPSULE | ORAL | 0 refills | Status: DC

## 2022-03-13 ENCOUNTER — Other Ambulatory Visit: Payer: Self-pay | Admitting: Internal Medicine

## 2022-03-13 DIAGNOSIS — I10 Essential (primary) hypertension: Secondary | ICD-10-CM

## 2022-03-14 DIAGNOSIS — Z96651 Presence of right artificial knee joint: Secondary | ICD-10-CM | POA: Diagnosis not present

## 2022-03-14 DIAGNOSIS — M25562 Pain in left knee: Secondary | ICD-10-CM | POA: Insufficient documentation

## 2022-03-14 DIAGNOSIS — Z471 Aftercare following joint replacement surgery: Secondary | ICD-10-CM | POA: Diagnosis not present

## 2022-03-15 DIAGNOSIS — M48061 Spinal stenosis, lumbar region without neurogenic claudication: Secondary | ICD-10-CM | POA: Diagnosis not present

## 2022-03-15 DIAGNOSIS — I10 Essential (primary) hypertension: Secondary | ICD-10-CM | POA: Diagnosis not present

## 2022-03-19 ENCOUNTER — Encounter: Payer: Self-pay | Admitting: Internal Medicine

## 2022-03-21 ENCOUNTER — Telehealth: Payer: Medicare Other | Admitting: Physician Assistant

## 2022-03-21 DIAGNOSIS — I10 Essential (primary) hypertension: Secondary | ICD-10-CM | POA: Diagnosis not present

## 2022-03-21 DIAGNOSIS — K148 Other diseases of tongue: Secondary | ICD-10-CM

## 2022-03-21 DIAGNOSIS — K1379 Other lesions of oral mucosa: Secondary | ICD-10-CM | POA: Diagnosis not present

## 2022-03-21 DIAGNOSIS — K121 Other forms of stomatitis: Secondary | ICD-10-CM | POA: Diagnosis not present

## 2022-03-21 NOTE — Progress Notes (Signed)
Because these do not seem like typical aphthous ulcers and there is evidence of some growth in the area (possibly due to chronic friction but other issues need to be ruled out), I feel your condition warrants further evaluation and I recommend that you be seen in a face to face visit.   NOTE: There will be NO CHARGE for this eVisit   If you are having a true medical emergency please call 911.      For an urgent face to face visit, [facility] has eight urgent care centers for your convenience:   NEW!! [facility] [place] [facility] [place] Get Driving Directions [phone] [address] [place], [place] [facility] [place] [facility] [place] Get Driving Directions [phone] [place], [place] [address]   [facility] [place] [facility]) Get Driving Directions [phone] [address], [place] [address]  [facility] [place] ([place]) Get Driving Directions [phone] [address] [place],  [place]  [address]  [facility] [address] [place] Get Driving Directions  [phone] [address] [place],  [place] [address]   [facility] Get Driving Directions [phone] [place] [place], [place], [place] [address]   [facility] Get Driving Directions  [phone] [address] [place], [place] [address]   [facility] [place] Get Driving Directions [phone] [address], [place],  [address]  Your MyChart E-visit questionnaire answers were reviewed by a board certified advanced clinical practitioner to complete your personal care plan based on your specific symptoms.  Thank you for using e-Visits.

## 2022-03-28 DIAGNOSIS — M5126 Other intervertebral disc displacement, lumbar region: Secondary | ICD-10-CM | POA: Diagnosis not present

## 2022-03-28 DIAGNOSIS — M48061 Spinal stenosis, lumbar region without neurogenic claudication: Secondary | ICD-10-CM | POA: Diagnosis not present

## 2022-03-29 ENCOUNTER — Ambulatory Visit (INDEPENDENT_AMBULATORY_CARE_PROVIDER_SITE_OTHER): Payer: Medicare Other | Admitting: Internal Medicine

## 2022-03-29 ENCOUNTER — Encounter: Payer: Self-pay | Admitting: Internal Medicine

## 2022-03-29 VITALS — BP 142/98 | HR 79 | Temp 98.0°F | Resp 16 | Ht 70.0 in | Wt 244.0 lb

## 2022-03-29 DIAGNOSIS — I10 Essential (primary) hypertension: Secondary | ICD-10-CM

## 2022-03-29 DIAGNOSIS — B370 Candidal stomatitis: Secondary | ICD-10-CM | POA: Diagnosis not present

## 2022-03-29 DIAGNOSIS — E60 Dietary zinc deficiency: Secondary | ICD-10-CM

## 2022-03-29 DIAGNOSIS — Z6835 Body mass index (BMI) 35.0-35.9, adult: Secondary | ICD-10-CM

## 2022-03-29 DIAGNOSIS — Z23 Encounter for immunization: Secondary | ICD-10-CM

## 2022-03-29 LAB — CBC WITH DIFFERENTIAL/PLATELET
Basophils Absolute: 0 10*3/uL (ref 0.0–0.1)
Basophils Relative: 0.2 % (ref 0.0–3.0)
Eosinophils Absolute: 0.1 10*3/uL (ref 0.0–0.7)
Eosinophils Relative: 1.5 % (ref 0.0–5.0)
HCT: 41.5 % (ref 39.0–52.0)
Hemoglobin: 13.9 g/dL (ref 13.0–17.0)
Lymphocytes Relative: 28.2 % (ref 12.0–46.0)
Lymphs Abs: 2.2 10*3/uL (ref 0.7–4.0)
MCHC: 33.5 g/dL (ref 30.0–36.0)
MCV: 86.5 fl (ref 78.0–100.0)
Monocytes Absolute: 1 10*3/uL (ref 0.1–1.0)
Monocytes Relative: 12.2 % — ABNORMAL HIGH (ref 3.0–12.0)
Neutro Abs: 4.5 10*3/uL (ref 1.4–7.7)
Neutrophils Relative %: 57.9 % (ref 43.0–77.0)
Platelets: 263 10*3/uL (ref 150.0–400.0)
RBC: 4.79 Mil/uL (ref 4.22–5.81)
RDW: 13.9 % (ref 11.5–15.5)
WBC: 7.9 10*3/uL (ref 4.0–10.5)

## 2022-03-29 LAB — BASIC METABOLIC PANEL
BUN: 15 mg/dL (ref 6–23)
CO2: 28 mEq/L (ref 19–32)
Calcium: 8.9 mg/dL (ref 8.4–10.5)
Chloride: 100 mEq/L (ref 96–112)
Creatinine, Ser: 0.99 mg/dL (ref 0.40–1.50)
GFR: 86.97 mL/min (ref >60.00)
Glucose, Bld: 80 mg/dL (ref 70–99)
Potassium: 5.4 mEq/L — ABNORMAL HIGH (ref 3.5–5.1)
Sodium: 133 mEq/L — ABNORMAL LOW (ref 135–145)

## 2022-03-29 MED ORDER — CLOTRIMAZOLE 10 MG MT TROC: 10.0000 mg | Freq: Every day | OROMUCOSAL | 1 refills | Status: DC

## 2022-03-29 MED ORDER — ZEPBOUND 2.5 MG/0.5ML ~~LOC~~ SOAJ: 2.5000 mg | SUBCUTANEOUS | 0 refills | Status: DC

## 2022-03-29 MED ORDER — INDAPAMIDE 1.25 MG PO TABS: 1.2500 mg | ORAL_TABLET | Freq: Every day | ORAL | 1 refills | Status: DC

## 2022-03-29 NOTE — Patient Instructions (Signed)
Hypertension, Adult High blood pressure (hypertension) is when the force of blood pumping through the arteries is too strong. The arteries are the blood vessels that carry blood from the heart throughout the body. Hypertension forces the heart to work harder to pump blood and may cause arteries to become narrow or stiff. Untreated or uncontrolled hypertension can lead to a heart attack, heart failure, a stroke, kidney disease, and other problems. A blood pressure reading consists of a higher number over a lower number. Ideally, your blood pressure should be below 120/80. The first ("top") number is called the systolic pressure. It is a measure of the pressure in your arteries as your heart beats. The second ("bottom") number is called the diastolic pressure. It is a measure of the pressure in your arteries as the heart relaxes. What are the causes? The exact cause of this condition is not known. There are some conditions that result in high blood pressure. What increases the risk? Certain factors may make you more likely to develop high blood pressure. Some of these risk factors are under your control, including: Smoking. Not getting enough exercise or physical activity. Being overweight. Having too much fat, sugar, calories, or salt (sodium) in your diet. Drinking too much alcohol. Other risk factors include: Having a personal history of heart disease, diabetes, high cholesterol, or kidney disease. Stress. Having a family history of high blood pressure and high cholesterol. Having obstructive sleep apnea. Age. The risk increases with age. What are the signs or symptoms? High blood pressure may not cause symptoms. Very high blood pressure (hypertensive crisis) may cause: Headache. Fast or irregular heartbeats (palpitations). Shortness of breath. Nosebleed. Nausea and vomiting. Vision changes. Severe chest pain, dizziness, and seizures. How is this diagnosed? This condition is diagnosed by  measuring your blood pressure while you are seated, with your arm resting on a flat surface, your legs uncrossed, and your feet flat on the floor. The cuff of the blood pressure monitor will be placed directly against the skin of your upper arm at the level of your heart. Blood pressure should be measured at least twice using the same arm. Certain conditions can cause a difference in blood pressure between your right and left arms. If you have a high blood pressure reading during one visit or you have normal blood pressure with other risk factors, you may be asked to: Return on a different day to have your blood pressure checked again. Monitor your blood pressure at home for 1 week or longer. If you are diagnosed with hypertension, you may have other blood or imaging tests to help your health care provider understand your overall risk for other conditions. How is this treated? This condition is treated by making healthy lifestyle changes, such as eating healthy foods, exercising more, and reducing your alcohol intake. You may be referred for counseling on a healthy diet and physical activity. Your health care provider may prescribe medicine if lifestyle changes are not enough to get your blood pressure under control and if: Your systolic blood pressure is above 130. Your diastolic blood pressure is above 80. Your personal target blood pressure may vary depending on your medical conditions, your age, and other factors. Follow these instructions at home: Eating and drinking  Eat a diet that is high in fiber and potassium, and low in sodium, added sugar, and fat. An example of this eating plan is called the DASH diet. DASH stands for Dietary Approaches to Stop Hypertension. To eat this way: Eat   plenty of fresh fruits and vegetables. Try to fill one half of your plate at each meal with fruits and vegetables. Eat whole grains, such as whole-wheat pasta, brown rice, or whole-grain bread. Fill about one  fourth of your plate with whole grains. Eat or drink low-fat dairy products, such as skim milk or low-fat yogurt. Avoid fatty cuts of meat, processed or cured meats, and poultry with skin. Fill about one fourth of your plate with lean proteins, such as fish, chicken without skin, beans, eggs, or tofu. Avoid pre-made and processed foods. These tend to be higher in sodium, added sugar, and fat. Reduce your daily sodium intake. Many people with hypertension should eat less than 1,500 mg of sodium a day. Do not drink alcohol if: Your health care provider tells you not to drink. You are pregnant, may be pregnant, or are planning to become pregnant. If you drink alcohol: Limit how much you have to: 0-1 drink a day for women. 0-2 drinks a day for men. Know how much alcohol is in your drink. In the [place] one drink equals one 12 oz bottle of beer (355 mL), one 5 oz glass of wine (148 mL), or one 1 oz glass of hard liquor (44 mL). Lifestyle  Work with your health care provider to maintain a healthy body weight or to lose weight. Ask what an ideal weight is for you. Get at least 30 minutes of exercise that causes your heart to beat faster (aerobic exercise) most days of the week. Activities may include walking, swimming, or biking. Include exercise to strengthen your muscles (resistance exercise), such as Pilates or lifting weights, as part of your weekly exercise routine. Try to do these types of exercises for 30 minutes at least 3 days a week. Do not use any products that contain nicotine or tobacco. These products include cigarettes, chewing tobacco, and vaping devices, such as e-cigarettes. If you need help quitting, ask your health care provider. Monitor your blood pressure at home as told by your health care provider. Keep all follow-up visits. This is important. Medicines Take over-the-counter and prescription medicines only as told by your health care provider. Follow directions carefully. Blood  pressure medicines must be taken as prescribed. Do not skip doses of blood pressure medicine. Doing this puts you at risk for problems and can make the medicine less effective. Ask your health care provider about side effects or reactions to medicines that you should watch for. Contact a health care provider if you: Think you are having a reaction to a medicine you are taking. Have headaches that keep coming back (recurring). Feel dizzy. Have swelling in your ankles. Have trouble with your vision. Get help right away if you: Develop a severe headache or confusion. Have unusual weakness or numbness. Feel faint. Have severe pain in your chest or abdomen. Vomit repeatedly. Have trouble breathing. These symptoms may be an emergency. Get help right away. Call 911. Do not wait to see if the symptoms will go away. Do not drive yourself to the hospital. Summary Hypertension is when the force of blood pumping through your arteries is too strong. If this condition is not controlled, it may put you at risk for serious complications. Your personal target blood pressure may vary depending on your medical conditions, your age, and other factors. For most people, a normal blood pressure is less than 120/80. Hypertension is treated with lifestyle changes, medicines, or a combination of both. Lifestyle changes include losing weight, eating a healthy,   low-sodium diet, exercising more, and limiting alcohol. This information is not intended to replace advice given to you by your health care provider. Make sure you discuss any questions you have with your health care provider. Document Revised: 12/28/2020 Document Reviewed: 12/28/2020 Elsevier Patient Education  2023 [employer]  

## 2022-03-29 NOTE — Progress Notes (Signed)
Subjective:  Patient ID: Bradley [name], male    DOB: 10/22/1970  Age: 52 y.o. MRN: [id number]  CC: Hypertension   HPI [name] presents for f/up --  He complains or a red painful tongue.  He has been receiving steroids for musculoskeletal pain.  He is active and denies DOE, CP, SOB, edema.  Outpatient Medications Prior to Visit  Medication Sig Dispense Refill   DODEX 1000 MCG/ML injection ADMINISTER 1 ML(1000 MCG) IN THE MUSCLE EVERY 30 DAYS 3 mL 0   DULoxetine (CYMBALTA) 20 MG capsule Take 20 mg by mouth 2 (two) times daily.     Fe Fum-Fe Poly-Vit C-Lactobac (FUSION) 65-65-25-30 MG CAPS Take 1 capsule by mouth 2 (two) times daily. 180 capsule 1   fluconazole (DIFLUCAN) 150 MG tablet Take 150 mg by mouth daily.     folic acid (FOLVITE) 1 MG tablet TAKE 1 TABLET(1 MG) BY MOUTH DAILY 90 tablet 1   gabapentin (NEURONTIN) 300 MG capsule Take 300 mg by mouth 3 (three) times daily.     INS SYRINGE/NEEDLE 1CC/28G 28G X 1/2" 1 ML MISC Use to inject b12 monthly 10 each 1   Insulin Pen Needle 32G X 6 MM MISC 1 Act by Does not apply route daily. 100 each 1   irbesartan (AVAPRO) 300 MG tablet TAKE 1 TABLET(300 MG) BY MOUTH DAILY 90 tablet 0   meloxicam (MOBIC) 15 MG tablet Take 1 tablet every day by oral route with meal(s).     Oxycodone HCl 10 MG TABS Take 10 mg by mouth 5 (five) times daily as needed (pain.).     phentermine 37.5 MG capsule Take 1 capsule (37.5 mg total) by mouth every morning. 90 capsule 0   sildenafil (REVATIO) 20 MG tablet Take 5 tablets (100 mg total) by mouth as directed. 60 tablet 3   zinc gluconate 50 MG tablet TAKE 1 TABLET(50 MG) BY MOUTH DAILY 90 tablet 1   No facility-administered medications prior to visit.    ROS Review of Systems  Constitutional: Negative.  Negative for diaphoresis and fatigue.  HENT: Negative.  Negative for mouth sores, sore throat and trouble swallowing.   Eyes: Negative.   Respiratory:  Negative for cough, chest tightness,  shortness of breath and wheezing.   Cardiovascular:  Negative for chest pain, palpitations and leg swelling.  Gastrointestinal:  Negative for abdominal pain, diarrhea, nausea and vomiting.  Endocrine: Negative.   Genitourinary: Negative.  Negative for difficulty urinating.  Musculoskeletal:  Positive for arthralgias and back pain. Negative for myalgias.  Skin:  Negative for color change.  Neurological: Negative.  Negative for dizziness and weakness.  Hematological:  Negative for adenopathy. Does not bruise/bleed easily.  Psychiatric/Behavioral: Negative.      Objective:  BP (!) 142/98 (BP Location: Right Arm, Patient Position: Sitting, Cuff Size: Normal)   Pulse 79   Temp 98 F (36.7 C) (Oral)   Resp 16   Ht 5\' 10"  (1.778 m)   Wt 244 lb (110.7 kg)   SpO2 98%   BMI 35.01 kg/m   BP Readings from Last 3 Encounters:  03/29/22 (!) 142/98  01/16/22 116/77  01/09/22 (!) 144/90    Wt Readings from Last 3 Encounters:  03/29/22 244 lb (110.7 kg)  01/16/22 242 lb 8.1 oz (110 kg)  01/09/22 244 lb (110.7 kg)    Physical Exam Vitals reviewed.  Constitutional:      Appearance: He is not ill-appearing.  HENT:     Nose: Nose  normal.     Mouth/Throat:     Mouth: Mucous membranes are moist.     Comments: Tongue is red but there is no exudate Eyes:     General: No scleral icterus.    Conjunctiva/sclera: Conjunctivae normal.  Cardiovascular:     Rate and Rhythm: Normal rate and regular rhythm.     Heart sounds: Murmur heard.     Systolic murmur is present with a grade of 1/6.     No friction rub. No gallop.  Pulmonary:     Effort: Pulmonary effort is normal.     Breath sounds: No stridor. No wheezing, rhonchi or rales.  Abdominal:     General: Abdomen is flat.     Palpations: There is no mass.     Tenderness: There is no abdominal tenderness. There is no guarding.     Hernia: No hernia is present.  Musculoskeletal:        General: No swelling. Normal range of motion.      Cervical back: Neck supple.  Skin:    General: Skin is warm and dry.     Findings: No rash.  Neurological:     General: No focal deficit present.     Mental Status: He is alert.     Lab Results  Component Value Date   WBC 7.9 03/29/2022   HGB 13.9 03/29/2022   HCT 41.5 03/29/2022   PLT 263.0 03/29/2022   GLUCOSE 80 03/29/2022   CHOL 149 12/13/2021   TRIG 103.0 12/13/2021   HDL 55.50 12/13/2021   LDLCALC 73 12/13/2021   ALT 30 12/13/2021   AST 18 12/13/2021   NA 133 (L) 03/29/2022   K 5.4 No hemolysis seen (H) 03/29/2022   CL 100 03/29/2022   CREATININE 0.99 03/29/2022   BUN 15 03/29/2022   CO2 28 03/29/2022   TSH 2.56 06/15/2021   PSA 0.63 12/13/2021   HGBA1C 5.6 06/15/2021    DG Cholangiogram Operative  Result Date: 01/16/2022 CLINICAL DATA:  Cholelithiasis EXAM: INTRAOPERATIVE CHOLANGIOGRAM COMPARISON:  US Abdomen, 12/16/2021.  CT AP, 05/05/2013. FLUOROSCOPY: Exposure Index (as provided by the fluoroscopic device): 37.6 mGy Kerma FINDINGS: Multiple, limited oblique planar images of the RIGHT upper quadrant obtained C-arm. Images demonstrating laparoscopic instrumentation, cystic duct cannulation and antegrade cholangiogram. Free spillage of contrast into the duodenum. No biliary ductal dilation. No evidence of biliary filling defect is demonstrated. IMPRESSION: Fluoroscopic imaging for intraoperative cholangiogram. No biliary ductal dilation or discrete filling defect is demonstrated. For complete description of intra procedural findings, please see performing service dictation. Electronically Signed   By: Michaelle Birks M.D.   On: 01/16/2022 12:14   DG C-Arm 1-60 Min-No Report  Result Date: 01/16/2022 Fluoroscopy was utilized by the requesting physician.  No radiographic interpretation.    Assessment & Plan:   [name] was seen today for hypertension.  Diagnoses and all orders for this visit:  Essential hypertension- His blood pressure is not adequately  well-controlled.  Will add a thiazide to the ARB. -     Basic metabolic panel; Future -     indapamide (LOZOL) 1.25 MG tablet; Take 1 tablet (1.25 mg total) by mouth daily. -     CBC with Differential/Platelet; Future -     CBC with Differential/Platelet -     Basic metabolic panel  Class 2 severe obesity due to excess calories with serious comorbidity and body mass index (BMI) of 35.0 to 35.9 in adult [facility]) -     tirzepatide (ZEPBOUND)  2.5 MG/0.5ML Pen; Inject 2.5 mg into the skin once a week.  Candida infection, oral -     Discontinue: clotrimazole (MYCELEX) 10 MG troche; Take 1 tablet (10 mg total) by mouth 5 (five) times daily for 11 days.  Zinc deficiency- His H&H are normal now. -     CBC with Differential/Platelet; Future -     CBC with Differential/Platelet  Need for prophylactic vaccination and inoculation against varicella -     Zoster Vaccine Adjuvanted [facility]) injection; Inject 0.5 mLs into the muscle once for 1 dose.   I am having Quentin Shorey. Skeens start on Zepbound, indapamide, and Shingrix. I am also having him maintain his INS SYRINGE/NEEDLE 1CC/28G, gabapentin, Oxycodone HCl, Insulin Pen Needle, DULoxetine, Dodex, zinc gluconate, sildenafil, folic acid, Fusion, phentermine, irbesartan, meloxicam, and fluconazole.  Meds ordered this encounter  Medications   tirzepatide (ZEPBOUND) 2.5 MG/0.5ML Pen    Sig: Inject 2.5 mg into the skin once a week.    Dispense:  2 mL    Refill:  0   indapamide (LOZOL) 1.25 MG tablet    Sig: Take 1 tablet (1.25 mg total) by mouth daily.    Dispense:  90 tablet    Refill:  1   DISCONTD: clotrimazole (MYCELEX) 10 MG troche    Sig: Take 1 tablet (10 mg total) by mouth 5 (five) times daily for 11 days.    Dispense:  55 tablet    Refill:  1   Zoster Vaccine Adjuvanted [facility]) injection    Sig: Inject 0.5 mLs into the muscle once for 1 dose.    Dispense:  0.5 mL    Refill:  1     Follow-up: Return in about 6 months (around  09/27/2022).  Sanda Linger, MD

## 2022-03-30 ENCOUNTER — Encounter: Payer: Self-pay | Admitting: Internal Medicine

## 2022-03-31 ENCOUNTER — Other Ambulatory Visit: Payer: Self-pay | Admitting: Internal Medicine

## 2022-03-31 DIAGNOSIS — B370 Candidal stomatitis: Secondary | ICD-10-CM

## 2022-03-31 MED ORDER — SHINGRIX 50 MCG/0.5ML IM SUSR: 0.5000 mL | Freq: Once | INTRAMUSCULAR | 1 refills | Status: AC

## 2022-03-31 MED ORDER — NYSTATIN 100000 UNIT/ML MT SUSP: 5.0000 mL | Freq: Four times a day (QID) | OROMUCOSAL | 2 refills | Status: DC

## 2022-04-05 ENCOUNTER — Other Ambulatory Visit: Payer: Self-pay | Admitting: Internal Medicine

## 2022-04-11 DIAGNOSIS — M5416 Radiculopathy, lumbar region: Secondary | ICD-10-CM | POA: Diagnosis not present

## 2022-04-14 ENCOUNTER — Emergency Department (HOSPITAL_COMMUNITY): Payer: Medicare Other

## 2022-04-14 ENCOUNTER — Encounter (HOSPITAL_COMMUNITY): Payer: Self-pay

## 2022-04-14 ENCOUNTER — Inpatient Hospital Stay (HOSPITAL_COMMUNITY)
Admission: EM | Admit: 2022-04-14 | Discharge: 2022-04-21 | DRG: 493 | Disposition: A | Discharge status: Rehabilitation Facility | Payer: Medicare Other | Attending: Surgery | Admitting: Surgery

## 2022-04-14 DIAGNOSIS — S299XXA Unspecified injury of thorax, initial encounter: Secondary | ICD-10-CM | POA: Diagnosis not present

## 2022-04-14 DIAGNOSIS — T07XXXA Unspecified multiple injuries, initial encounter: Principal | ICD-10-CM

## 2022-04-14 DIAGNOSIS — I10 Essential (primary) hypertension: Secondary | ICD-10-CM | POA: Diagnosis not present

## 2022-04-14 DIAGNOSIS — W132XXA Fall from, out of or through roof, initial encounter: Secondary | ICD-10-CM | POA: Diagnosis present

## 2022-04-14 DIAGNOSIS — F1120 Opioid dependence, uncomplicated: Secondary | ICD-10-CM | POA: Diagnosis not present

## 2022-04-14 DIAGNOSIS — Z8249 Family history of ischemic heart disease and other diseases of the circulatory system: Secondary | ICD-10-CM | POA: Diagnosis not present

## 2022-04-14 DIAGNOSIS — S270XXA Traumatic pneumothorax, initial encounter: Secondary | ICD-10-CM | POA: Diagnosis not present

## 2022-04-14 DIAGNOSIS — S32038A Other fracture of third lumbar vertebra, initial encounter for closed fracture: Secondary | ICD-10-CM | POA: Diagnosis not present

## 2022-04-14 DIAGNOSIS — W19XXXA Unspecified fall, initial encounter: Secondary | ICD-10-CM | POA: Diagnosis not present

## 2022-04-14 DIAGNOSIS — T1490XA Injury, unspecified, initial encounter: Secondary | ICD-10-CM | POA: Diagnosis not present

## 2022-04-14 DIAGNOSIS — T148XXA Other injury of unspecified body region, initial encounter: Secondary | ICD-10-CM | POA: Diagnosis not present

## 2022-04-14 DIAGNOSIS — Z981 Arthrodesis status: Secondary | ICD-10-CM

## 2022-04-14 DIAGNOSIS — S32028A Other fracture of second lumbar vertebra, initial encounter for closed fracture: Secondary | ICD-10-CM | POA: Diagnosis present

## 2022-04-14 DIAGNOSIS — M79601 Pain in right arm: Secondary | ICD-10-CM | POA: Diagnosis not present

## 2022-04-14 DIAGNOSIS — S32029A Unspecified fracture of second lumbar vertebra, initial encounter for closed fracture: Secondary | ICD-10-CM | POA: Diagnosis not present

## 2022-04-14 DIAGNOSIS — Z96651 Presence of right artificial knee joint: Secondary | ICD-10-CM | POA: Diagnosis not present

## 2022-04-14 DIAGNOSIS — M25521 Pain in right elbow: Secondary | ICD-10-CM | POA: Diagnosis not present

## 2022-04-14 DIAGNOSIS — K5901 Slow transit constipation: Secondary | ICD-10-CM | POA: Diagnosis not present

## 2022-04-14 DIAGNOSIS — S32048A Other fracture of fourth lumbar vertebra, initial encounter for closed fracture: Secondary | ICD-10-CM | POA: Diagnosis present

## 2022-04-14 DIAGNOSIS — S199XXA Unspecified injury of neck, initial encounter: Secondary | ICD-10-CM | POA: Diagnosis not present

## 2022-04-14 DIAGNOSIS — I350 Nonrheumatic aortic (valve) stenosis: Secondary | ICD-10-CM | POA: Diagnosis not present

## 2022-04-14 DIAGNOSIS — G894 Chronic pain syndrome: Secondary | ICD-10-CM | POA: Diagnosis not present

## 2022-04-14 DIAGNOSIS — I951 Orthostatic hypotension: Secondary | ICD-10-CM | POA: Diagnosis not present

## 2022-04-14 DIAGNOSIS — R579 Shock, unspecified: Secondary | ICD-10-CM | POA: Diagnosis not present

## 2022-04-14 DIAGNOSIS — G8918 Other acute postprocedural pain: Secondary | ICD-10-CM | POA: Diagnosis not present

## 2022-04-14 DIAGNOSIS — K219 Gastro-esophageal reflux disease without esophagitis: Secondary | ICD-10-CM | POA: Diagnosis not present

## 2022-04-14 DIAGNOSIS — G8929 Other chronic pain: Secondary | ICD-10-CM | POA: Diagnosis not present

## 2022-04-14 DIAGNOSIS — M25512 Pain in left shoulder: Secondary | ICD-10-CM | POA: Diagnosis not present

## 2022-04-14 DIAGNOSIS — S32049A Unspecified fracture of fourth lumbar vertebra, initial encounter for closed fracture: Secondary | ICD-10-CM | POA: Diagnosis not present

## 2022-04-14 DIAGNOSIS — S32019A Unspecified fracture of first lumbar vertebra, initial encounter for closed fracture: Secondary | ICD-10-CM | POA: Diagnosis not present

## 2022-04-14 DIAGNOSIS — R0789 Other chest pain: Secondary | ICD-10-CM | POA: Diagnosis not present

## 2022-04-14 DIAGNOSIS — S42411A Displaced simple supracondylar fracture without intercondylar fracture of right humerus, initial encounter for closed fracture: Secondary | ICD-10-CM | POA: Diagnosis not present

## 2022-04-14 DIAGNOSIS — S42491S Other displaced fracture of lower end of right humerus, sequela: Secondary | ICD-10-CM | POA: Diagnosis not present

## 2022-04-14 DIAGNOSIS — M542 Cervicalgia: Secondary | ICD-10-CM | POA: Diagnosis not present

## 2022-04-14 DIAGNOSIS — Y92008 Other place in unspecified non-institutional (private) residence as the place of occurrence of the external cause: Secondary | ICD-10-CM | POA: Diagnosis not present

## 2022-04-14 DIAGNOSIS — Z743 Need for continuous supervision: Secondary | ICD-10-CM | POA: Diagnosis not present

## 2022-04-14 DIAGNOSIS — M25531 Pain in right wrist: Secondary | ICD-10-CM | POA: Diagnosis not present

## 2022-04-14 DIAGNOSIS — K5900 Constipation, unspecified: Secondary | ICD-10-CM | POA: Diagnosis present

## 2022-04-14 DIAGNOSIS — S22089A Unspecified fracture of T11-T12 vertebra, initial encounter for closed fracture: Secondary | ICD-10-CM | POA: Diagnosis not present

## 2022-04-14 DIAGNOSIS — I2510 Atherosclerotic heart disease of native coronary artery without angina pectoris: Secondary | ICD-10-CM | POA: Diagnosis not present

## 2022-04-14 DIAGNOSIS — S42401S Unspecified fracture of lower end of right humerus, sequela: Secondary | ICD-10-CM | POA: Diagnosis not present

## 2022-04-14 DIAGNOSIS — S300XXA Contusion of lower back and pelvis, initial encounter: Secondary | ICD-10-CM | POA: Diagnosis not present

## 2022-04-14 DIAGNOSIS — S42491A Other displaced fracture of lower end of right humerus, initial encounter for closed fracture: Secondary | ICD-10-CM | POA: Diagnosis not present

## 2022-04-14 DIAGNOSIS — G5621 Lesion of ulnar nerve, right upper limb: Secondary | ICD-10-CM | POA: Diagnosis not present

## 2022-04-14 DIAGNOSIS — S42401A Unspecified fracture of lower end of right humerus, initial encounter for closed fracture: Secondary | ICD-10-CM | POA: Diagnosis not present

## 2022-04-14 DIAGNOSIS — N179 Acute kidney failure, unspecified: Secondary | ICD-10-CM | POA: Diagnosis not present

## 2022-04-14 DIAGNOSIS — E871 Hypo-osmolality and hyponatremia: Secondary | ICD-10-CM | POA: Diagnosis present

## 2022-04-14 DIAGNOSIS — S32039A Unspecified fracture of third lumbar vertebra, initial encounter for closed fracture: Secondary | ICD-10-CM | POA: Diagnosis not present

## 2022-04-14 DIAGNOSIS — G4709 Other insomnia: Secondary | ICD-10-CM | POA: Diagnosis not present

## 2022-04-14 DIAGNOSIS — G4700 Insomnia, unspecified: Secondary | ICD-10-CM | POA: Diagnosis not present

## 2022-04-14 DIAGNOSIS — S52024A Nondisplaced fracture of olecranon process without intraarticular extension of right ulna, initial encounter for closed fracture: Secondary | ICD-10-CM | POA: Diagnosis not present

## 2022-04-14 DIAGNOSIS — S0990XA Unspecified injury of head, initial encounter: Secondary | ICD-10-CM | POA: Diagnosis not present

## 2022-04-14 DIAGNOSIS — Z79899 Other long term (current) drug therapy: Secondary | ICD-10-CM

## 2022-04-14 DIAGNOSIS — R0602 Shortness of breath: Secondary | ICD-10-CM | POA: Diagnosis not present

## 2022-04-14 DIAGNOSIS — S52131A Displaced fracture of neck of right radius, initial encounter for closed fracture: Secondary | ICD-10-CM | POA: Diagnosis present

## 2022-04-14 DIAGNOSIS — K5903 Drug induced constipation: Secondary | ICD-10-CM | POA: Diagnosis not present

## 2022-04-14 DIAGNOSIS — Z9884 Bariatric surgery status: Secondary | ICD-10-CM | POA: Diagnosis not present

## 2022-04-14 DIAGNOSIS — S32058A Other fracture of fifth lumbar vertebra, initial encounter for closed fracture: Secondary | ICD-10-CM | POA: Diagnosis not present

## 2022-04-14 DIAGNOSIS — S42191A Fracture of other part of scapula, right shoulder, initial encounter for closed fracture: Secondary | ICD-10-CM | POA: Diagnosis not present

## 2022-04-14 DIAGNOSIS — Z9889 Other specified postprocedural states: Secondary | ICD-10-CM | POA: Diagnosis not present

## 2022-04-14 DIAGNOSIS — M7989 Other specified soft tissue disorders: Secondary | ICD-10-CM | POA: Diagnosis not present

## 2022-04-14 DIAGNOSIS — S3993XA Unspecified injury of pelvis, initial encounter: Secondary | ICD-10-CM | POA: Diagnosis not present

## 2022-04-14 DIAGNOSIS — R6889 Other general symptoms and signs: Secondary | ICD-10-CM | POA: Diagnosis not present

## 2022-04-14 DIAGNOSIS — S2241XA Multiple fractures of ribs, right side, initial encounter for closed fracture: Secondary | ICD-10-CM | POA: Diagnosis not present

## 2022-04-14 DIAGNOSIS — S12600A Unspecified displaced fracture of seventh cervical vertebra, initial encounter for closed fracture: Secondary | ICD-10-CM | POA: Diagnosis present

## 2022-04-14 DIAGNOSIS — I119 Hypertensive heart disease without heart failure: Secondary | ICD-10-CM | POA: Diagnosis not present

## 2022-04-14 DIAGNOSIS — G8911 Acute pain due to trauma: Secondary | ICD-10-CM | POA: Diagnosis not present

## 2022-04-14 DIAGNOSIS — S12690S Other displaced fracture of seventh cervical vertebra, sequela: Secondary | ICD-10-CM | POA: Diagnosis not present

## 2022-04-14 DIAGNOSIS — S42101A Fracture of unspecified part of scapula, right shoulder, initial encounter for closed fracture: Secondary | ICD-10-CM | POA: Diagnosis not present

## 2022-04-14 DIAGNOSIS — R109 Unspecified abdominal pain: Secondary | ICD-10-CM | POA: Diagnosis not present

## 2022-04-14 DIAGNOSIS — Z800 Family history of malignant neoplasm of digestive organs: Secondary | ICD-10-CM

## 2022-04-14 DIAGNOSIS — M545 Low back pain, unspecified: Secondary | ICD-10-CM | POA: Diagnosis not present

## 2022-04-14 DIAGNOSIS — D62 Acute posthemorrhagic anemia: Secondary | ICD-10-CM | POA: Diagnosis not present

## 2022-04-14 DIAGNOSIS — Z885 Allergy status to narcotic agent status: Secondary | ICD-10-CM

## 2022-04-14 DIAGNOSIS — Z803 Family history of malignant neoplasm of breast: Secondary | ICD-10-CM

## 2022-04-14 DIAGNOSIS — S42301A Unspecified fracture of shaft of humerus, right arm, initial encounter for closed fracture: Secondary | ICD-10-CM | POA: Diagnosis not present

## 2022-04-14 DIAGNOSIS — R339 Retention of urine, unspecified: Secondary | ICD-10-CM | POA: Diagnosis not present

## 2022-04-14 DIAGNOSIS — S2231XA Fracture of one rib, right side, initial encounter for closed fracture: Secondary | ICD-10-CM | POA: Diagnosis not present

## 2022-04-14 DIAGNOSIS — S42351A Displaced comminuted fracture of shaft of humerus, right arm, initial encounter for closed fracture: Secondary | ICD-10-CM | POA: Diagnosis not present

## 2022-04-14 DIAGNOSIS — S42421A Displaced comminuted supracondylar fracture without intercondylar fracture of right humerus, initial encounter for closed fracture: Secondary | ICD-10-CM | POA: Diagnosis not present

## 2022-04-14 DIAGNOSIS — S52021A Displaced fracture of olecranon process without intraarticular extension of right ulna, initial encounter for closed fracture: Secondary | ICD-10-CM | POA: Diagnosis not present

## 2022-04-14 DIAGNOSIS — J939 Pneumothorax, unspecified: Secondary | ICD-10-CM | POA: Diagnosis not present

## 2022-04-14 DIAGNOSIS — S32020A Wedge compression fracture of second lumbar vertebra, initial encounter for closed fracture: Secondary | ICD-10-CM | POA: Diagnosis not present

## 2022-04-14 DIAGNOSIS — Z79891 Long term (current) use of opiate analgesic: Secondary | ICD-10-CM

## 2022-04-14 DIAGNOSIS — S12601A Unspecified nondisplaced fracture of seventh cervical vertebra, initial encounter for closed fracture: Secondary | ICD-10-CM | POA: Diagnosis not present

## 2022-04-14 DIAGNOSIS — S42353S Displaced comminuted fracture of shaft of humerus, unspecified arm, sequela: Secondary | ICD-10-CM | POA: Diagnosis not present

## 2022-04-14 LAB — I-STAT CHEM 8, ED
BUN: 24 mg/dL — ABNORMAL HIGH (ref 6–20)
Calcium, Ion: 1.12 mmol/L — ABNORMAL LOW (ref 1.15–1.40)
Chloride: 102 mmol/L (ref 98–111)
Creatinine, Ser: 1.3 mg/dL — ABNORMAL HIGH (ref 0.61–1.24)
Glucose, Bld: 135 mg/dL — ABNORMAL HIGH (ref 70–99)
HCT: 42 % (ref 39.0–52.0)
Hemoglobin: 14.3 g/dL (ref 13.0–17.0)
Potassium: 4.2 mmol/L (ref 3.5–5.1)
Sodium: 136 mmol/L (ref 135–145)
TCO2: 25 mmol/L (ref 22–32)

## 2022-04-14 LAB — ETHANOL: Alcohol, Ethyl (B): <10 mg/dL (ref <10)

## 2022-04-14 LAB — COMPREHENSIVE METABOLIC PANEL
ALT: 49 U/L — ABNORMAL HIGH (ref 0–44)
AST: 60 U/L — ABNORMAL HIGH (ref 15–41)
Albumin: 3.7 g/dL (ref 3.5–5.0)
Alkaline Phosphatase: 91 U/L (ref 38–126)
Anion gap: 10 (ref 5–15)
BUN: 22 mg/dL — ABNORMAL HIGH (ref 6–20)
CO2: 23 mmol/L (ref 22–32)
Calcium: 8.9 mg/dL (ref 8.9–10.3)
Chloride: 101 mmol/L (ref 98–111)
Creatinine, Ser: 1.33 mg/dL — ABNORMAL HIGH (ref 0.61–1.24)
GFR, Estimated: >60 mL/min (ref >60)
Glucose, Bld: 141 mg/dL — ABNORMAL HIGH (ref 70–99)
Potassium: 4.1 mmol/L (ref 3.5–5.1)
Sodium: 134 mmol/L — ABNORMAL LOW (ref 135–145)
Total Bilirubin: 0.7 mg/dL (ref 0.3–1.2)
Total Protein: 6.4 g/dL — ABNORMAL LOW (ref 6.5–8.1)

## 2022-04-14 LAB — LACTIC ACID, PLASMA: Lactic Acid, Venous: 1.9 mmol/L (ref 0.5–1.9)

## 2022-04-14 LAB — SAMPLE TO BLOOD BANK

## 2022-04-14 LAB — PROTIME-INR
INR: 1.1 (ref 0.8–1.2)
Prothrombin Time: 13.9 seconds (ref 11.4–15.2)

## 2022-04-14 MED ORDER — KETAMINE HCL 50 MG/5ML IJ SOSY
0.2000 mg/kg | PREFILLED_SYRINGE | Freq: Once | INTRAMUSCULAR | Status: AC
  Administered 2022-04-14: 23 mg via INTRAVENOUS

## 2022-04-14 MED ORDER — KETAMINE HCL 50 MG/5ML IJ SOSY
10.0000 mg | PREFILLED_SYRINGE | Freq: Once | INTRAMUSCULAR | Status: AC
  Administered 2022-04-14: 10 mg via INTRAVENOUS
  Filled 2022-04-14: qty 5

## 2022-04-14 MED ORDER — IOHEXOL 350 MG/ML SOLN
100.0000 mL | Freq: Once | INTRAVENOUS | Status: AC | PRN
  Administered 2022-04-14: 100 mL via INTRAVENOUS

## 2022-04-14 MED ORDER — FENTANYL CITRATE (PF) 100 MCG/2ML IJ SOLN
INTRAMUSCULAR | Status: DC | PRN
  Administered 2022-04-14: 100 ug via INTRAVENOUS

## 2022-04-14 MED ORDER — FENTANYL CITRATE PF 50 MCG/ML IJ SOSY
PREFILLED_SYRINGE | INTRAMUSCULAR | Status: AC
  Filled 2022-04-14: qty 2
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
## 2022-04-15 ENCOUNTER — Inpatient Hospital Stay (HOSPITAL_COMMUNITY): Payer: Medicare Other

## 2022-04-15 ENCOUNTER — Emergency Department (HOSPITAL_COMMUNITY): Payer: Medicare Other

## 2022-04-15 ENCOUNTER — Other Ambulatory Visit: Payer: Self-pay

## 2022-04-15 DIAGNOSIS — T1490XA Injury, unspecified, initial encounter: Secondary | ICD-10-CM | POA: Diagnosis not present

## 2022-04-15 DIAGNOSIS — I350 Nonrheumatic aortic (valve) stenosis: Secondary | ICD-10-CM | POA: Diagnosis not present

## 2022-04-15 DIAGNOSIS — S12600A Unspecified displaced fracture of seventh cervical vertebra, initial encounter for closed fracture: Secondary | ICD-10-CM | POA: Diagnosis present

## 2022-04-15 DIAGNOSIS — M79601 Pain in right arm: Secondary | ICD-10-CM | POA: Diagnosis not present

## 2022-04-15 DIAGNOSIS — Z8249 Family history of ischemic heart disease and other diseases of the circulatory system: Secondary | ICD-10-CM | POA: Diagnosis not present

## 2022-04-15 DIAGNOSIS — S42353S Displaced comminuted fracture of shaft of humerus, unspecified arm, sequela: Secondary | ICD-10-CM | POA: Diagnosis not present

## 2022-04-15 DIAGNOSIS — N179 Acute kidney failure, unspecified: Secondary | ICD-10-CM | POA: Diagnosis present

## 2022-04-15 DIAGNOSIS — S52024A Nondisplaced fracture of olecranon process without intraarticular extension of right ulna, initial encounter for closed fracture: Secondary | ICD-10-CM | POA: Diagnosis present

## 2022-04-15 DIAGNOSIS — G4709 Other insomnia: Secondary | ICD-10-CM | POA: Diagnosis not present

## 2022-04-15 DIAGNOSIS — S42401A Unspecified fracture of lower end of right humerus, initial encounter for closed fracture: Secondary | ICD-10-CM | POA: Diagnosis not present

## 2022-04-15 DIAGNOSIS — S32058A Other fracture of fifth lumbar vertebra, initial encounter for closed fracture: Secondary | ICD-10-CM | POA: Diagnosis present

## 2022-04-15 DIAGNOSIS — Z79899 Other long term (current) drug therapy: Secondary | ICD-10-CM | POA: Diagnosis not present

## 2022-04-15 DIAGNOSIS — S270XXA Traumatic pneumothorax, initial encounter: Secondary | ICD-10-CM | POA: Diagnosis present

## 2022-04-15 DIAGNOSIS — F1120 Opioid dependence, uncomplicated: Secondary | ICD-10-CM | POA: Diagnosis not present

## 2022-04-15 DIAGNOSIS — G4700 Insomnia, unspecified: Secondary | ICD-10-CM | POA: Diagnosis not present

## 2022-04-15 DIAGNOSIS — S2241XA Multiple fractures of ribs, right side, initial encounter for closed fracture: Secondary | ICD-10-CM | POA: Diagnosis present

## 2022-04-15 DIAGNOSIS — K5901 Slow transit constipation: Secondary | ICD-10-CM | POA: Diagnosis not present

## 2022-04-15 DIAGNOSIS — S32028A Other fracture of second lumbar vertebra, initial encounter for closed fracture: Secondary | ICD-10-CM | POA: Diagnosis present

## 2022-04-15 DIAGNOSIS — R339 Retention of urine, unspecified: Secondary | ICD-10-CM | POA: Diagnosis not present

## 2022-04-15 DIAGNOSIS — Z9884 Bariatric surgery status: Secondary | ICD-10-CM | POA: Diagnosis not present

## 2022-04-15 DIAGNOSIS — S52131A Displaced fracture of neck of right radius, initial encounter for closed fracture: Secondary | ICD-10-CM | POA: Diagnosis present

## 2022-04-15 DIAGNOSIS — M25521 Pain in right elbow: Secondary | ICD-10-CM | POA: Diagnosis not present

## 2022-04-15 DIAGNOSIS — G8911 Acute pain due to trauma: Secondary | ICD-10-CM | POA: Diagnosis not present

## 2022-04-15 DIAGNOSIS — K219 Gastro-esophageal reflux disease without esophagitis: Secondary | ICD-10-CM | POA: Diagnosis present

## 2022-04-15 DIAGNOSIS — R579 Shock, unspecified: Secondary | ICD-10-CM | POA: Diagnosis present

## 2022-04-15 DIAGNOSIS — Z96651 Presence of right artificial knee joint: Secondary | ICD-10-CM | POA: Diagnosis present

## 2022-04-15 DIAGNOSIS — W19XXXA Unspecified fall, initial encounter: Secondary | ICD-10-CM | POA: Diagnosis present

## 2022-04-15 DIAGNOSIS — I2510 Atherosclerotic heart disease of native coronary artery without angina pectoris: Secondary | ICD-10-CM | POA: Diagnosis present

## 2022-04-15 DIAGNOSIS — Z800 Family history of malignant neoplasm of digestive organs: Secondary | ICD-10-CM | POA: Diagnosis not present

## 2022-04-15 DIAGNOSIS — S42301D Unspecified fracture of shaft of humerus, right arm, subsequent encounter for fracture with routine healing: Secondary | ICD-10-CM | POA: Diagnosis not present

## 2022-04-15 DIAGNOSIS — G894 Chronic pain syndrome: Secondary | ICD-10-CM | POA: Diagnosis not present

## 2022-04-15 DIAGNOSIS — K5903 Drug induced constipation: Secondary | ICD-10-CM | POA: Diagnosis not present

## 2022-04-15 DIAGNOSIS — I951 Orthostatic hypotension: Secondary | ICD-10-CM | POA: Diagnosis not present

## 2022-04-15 DIAGNOSIS — D62 Acute posthemorrhagic anemia: Secondary | ICD-10-CM | POA: Diagnosis present

## 2022-04-15 DIAGNOSIS — W132XXA Fall from, out of or through roof, initial encounter: Secondary | ICD-10-CM | POA: Diagnosis present

## 2022-04-15 DIAGNOSIS — S42421A Displaced comminuted supracondylar fracture without intercondylar fracture of right humerus, initial encounter for closed fracture: Secondary | ICD-10-CM | POA: Diagnosis present

## 2022-04-15 DIAGNOSIS — E871 Hypo-osmolality and hyponatremia: Secondary | ICD-10-CM | POA: Diagnosis present

## 2022-04-15 DIAGNOSIS — I10 Essential (primary) hypertension: Secondary | ICD-10-CM | POA: Diagnosis not present

## 2022-04-15 DIAGNOSIS — Z981 Arthrodesis status: Secondary | ICD-10-CM | POA: Diagnosis not present

## 2022-04-15 DIAGNOSIS — I119 Hypertensive heart disease without heart failure: Secondary | ICD-10-CM | POA: Diagnosis not present

## 2022-04-15 DIAGNOSIS — G8929 Other chronic pain: Secondary | ICD-10-CM | POA: Diagnosis not present

## 2022-04-15 DIAGNOSIS — S12690S Other displaced fracture of seventh cervical vertebra, sequela: Secondary | ICD-10-CM | POA: Diagnosis not present

## 2022-04-15 DIAGNOSIS — S32048A Other fracture of fourth lumbar vertebra, initial encounter for closed fracture: Secondary | ICD-10-CM | POA: Diagnosis present

## 2022-04-15 DIAGNOSIS — S32038A Other fracture of third lumbar vertebra, initial encounter for closed fracture: Secondary | ICD-10-CM | POA: Diagnosis present

## 2022-04-15 DIAGNOSIS — Y92008 Other place in unspecified non-institutional (private) residence as the place of occurrence of the external cause: Secondary | ICD-10-CM | POA: Diagnosis not present

## 2022-04-15 LAB — CBC
HCT: 36.9 % — ABNORMAL LOW (ref 39.0–52.0)
HCT: 34.3 % — ABNORMAL LOW (ref 39.0–52.0)
Hemoglobin: 11.9 g/dL — ABNORMAL LOW (ref 13.0–17.0)
Hemoglobin: 11.5 g/dL — ABNORMAL LOW (ref 13.0–17.0)
MCH: 28.6 pg (ref 26.0–34.0)
MCH: 28.7 pg (ref 26.0–34.0)
MCHC: 32.2 g/dL (ref 30.0–36.0)
MCHC: 33.5 g/dL (ref 30.0–36.0)
MCV: 88.7 fL (ref 80.0–100.0)
MCV: 85.5 fL (ref 80.0–100.0)
Platelets: 277 10*3/uL (ref 150–400)
Platelets: 274 10*3/uL (ref 150–400)
RBC: 4.16 MIL/uL — ABNORMAL LOW (ref 4.22–5.81)
RBC: 4.01 MIL/uL — ABNORMAL LOW (ref 4.22–5.81)
RDW: 13.5 % (ref 11.5–15.5)
RDW: 13.3 % (ref 11.5–15.5)
WBC: 12.7 10*3/uL — ABNORMAL HIGH (ref 4.0–10.5)
WBC: 9.5 10*3/uL (ref 4.0–10.5)
nRBC: 0 % (ref 0.0–0.2)
nRBC: 0 % (ref 0.0–0.2)

## 2022-04-15 LAB — CBC WITH DIFFERENTIAL/PLATELET
Abs Immature Granulocytes: 0.02 10*3/uL (ref 0.00–0.07)
Basophils Absolute: 0 10*3/uL (ref 0.0–0.1)
Basophils Relative: 0 %
Eosinophils Absolute: 0.1 10*3/uL (ref 0.0–0.5)
Eosinophils Relative: 1 %
HCT: 31.4 % — ABNORMAL LOW (ref 39.0–52.0)
Hemoglobin: 10.4 g/dL — ABNORMAL LOW (ref 13.0–17.0)
Immature Granulocytes: 0 %
Lymphocytes Relative: 31 %
Lymphs Abs: 2.1 10*3/uL (ref 0.7–4.0)
MCH: 28.6 pg (ref 26.0–34.0)
MCHC: 33.1 g/dL (ref 30.0–36.0)
MCV: 86.3 fL (ref 80.0–100.0)
Monocytes Absolute: 0.9 10*3/uL (ref 0.1–1.0)
Monocytes Relative: 13 %
Neutro Abs: 3.8 10*3/uL (ref 1.7–7.7)
Neutrophils Relative %: 55 %
Platelets: 256 10*3/uL (ref 150–400)
RBC: 3.64 MIL/uL — ABNORMAL LOW (ref 4.22–5.81)
RDW: 13.6 % (ref 11.5–15.5)
WBC: 6.8 10*3/uL (ref 4.0–10.5)
nRBC: 0 % (ref 0.0–0.2)

## 2022-04-15 LAB — COMPREHENSIVE METABOLIC PANEL
ALT: 44 U/L (ref 0–44)
AST: 51 U/L — ABNORMAL HIGH (ref 15–41)
Albumin: 3.1 g/dL — ABNORMAL LOW (ref 3.5–5.0)
Alkaline Phosphatase: 77 U/L (ref 38–126)
Anion gap: 10 (ref 5–15)
BUN: 20 mg/dL (ref 6–20)
CO2: 22 mmol/L (ref 22–32)
Calcium: 8.5 mg/dL — ABNORMAL LOW (ref 8.9–10.3)
Chloride: 101 mmol/L (ref 98–111)
Creatinine, Ser: 0.95 mg/dL (ref 0.61–1.24)
GFR, Estimated: >60 mL/min (ref >60)
Glucose, Bld: 124 mg/dL — ABNORMAL HIGH (ref 70–99)
Potassium: 4.4 mmol/L (ref 3.5–5.1)
Sodium: 133 mmol/L — ABNORMAL LOW (ref 135–145)
Total Bilirubin: 1 mg/dL (ref 0.3–1.2)
Total Protein: 5.5 g/dL — ABNORMAL LOW (ref 6.5–8.1)

## 2022-04-15 LAB — MRSA NEXT GEN BY PCR, NASAL: MRSA by PCR Next Gen: NOT DETECTED

## 2022-04-15 LAB — HIV ANTIBODY (ROUTINE TESTING W REFLEX): HIV Screen 4th Generation wRfx: NONREACTIVE

## 2022-04-15 MED ORDER — LIDOCAINE 5 % EX PTCH
2.0000 | MEDICATED_PATCH | Freq: Every day | CUTANEOUS | Status: DC
  Administered 2022-04-15 – 2022-04-21 (×7): 2 via TRANSDERMAL
  Filled 2022-04-15 (×7): qty 2

## 2022-04-15 MED ORDER — CHLORHEXIDINE GLUCONATE CLOTH 2 % EX PADS
6.0000 | MEDICATED_PAD | Freq: Every day | CUTANEOUS | Status: DC
  Administered 2022-04-15 – 2022-04-18 (×4): 6 via TOPICAL

## 2022-04-15 MED ORDER — DIPHENHYDRAMINE HCL 50 MG/ML IJ SOLN: 12.5000 mg | Freq: Four times a day (QID) | INTRAMUSCULAR | Status: DC | PRN

## 2022-04-15 MED ORDER — LACTATED RINGERS IV BOLUS
1000.0000 mL | Freq: Once | INTRAVENOUS | Status: AC
  Administered 2022-04-15: 1000 mL via INTRAVENOUS

## 2022-04-15 MED ORDER — SODIUM CHLORIDE 0.9% FLUSH: 9.0000 mL | INTRAVENOUS | Status: DC | PRN

## 2022-04-15 MED ORDER — HYDRALAZINE HCL 20 MG/ML IJ SOLN: 10.0000 mg | INTRAMUSCULAR | Status: DC | PRN

## 2022-04-15 MED ORDER — ZINC SULFATE 220 (50 ZN) MG PO CAPS
220.0000 mg | ORAL_CAPSULE | Freq: Every day | ORAL | Status: DC
  Administered 2022-04-15 – 2022-04-21 (×6): 220 mg via ORAL
  Filled 2022-04-15 (×6): qty 1

## 2022-04-15 MED ORDER — METHOCARBAMOL 500 MG PO TABS
1000.0000 mg | ORAL_TABLET | Freq: Three times a day (TID) | ORAL | Status: DC
  Administered 2022-04-15 – 2022-04-21 (×19): 1000 mg via ORAL
  Filled 2022-04-15 (×19): qty 2

## 2022-04-15 MED ORDER — GABAPENTIN 400 MG PO CAPS
400.0000 mg | ORAL_CAPSULE | Freq: Three times a day (TID) | ORAL | Status: DC
  Administered 2022-04-15 – 2022-04-17 (×7): 400 mg via ORAL
  Filled 2022-04-15 (×7): qty 1

## 2022-04-15 MED ORDER — ENOXAPARIN SODIUM 40 MG/0.4ML IJ SOSY: 40.0000 mg | PREFILLED_SYRINGE | Freq: Two times a day (BID) | INTRAMUSCULAR | Status: DC

## 2022-04-15 MED ORDER — GABAPENTIN 300 MG PO CAPS: 300.0000 mg | ORAL_CAPSULE | Freq: Three times a day (TID) | ORAL | Status: DC

## 2022-04-15 MED ORDER — ADULT MULTIVITAMIN W/MINERALS CH
1.0000 | ORAL_TABLET | Freq: Every day | ORAL | Status: DC
  Administered 2022-04-15 – 2022-04-21 (×6): 1 via ORAL
  Filled 2022-04-15 (×6): qty 1

## 2022-04-15 MED ORDER — POTASSIUM CHLORIDE IN NACL 20-0.9 MEQ/L-% IV SOLN
INTRAVENOUS | Status: DC
  Filled 2022-04-15 (×2): qty 1000

## 2022-04-15 MED ORDER — ENOXAPARIN SODIUM 30 MG/0.3ML IJ SOSY: 30.0000 mg | PREFILLED_SYRINGE | Freq: Two times a day (BID) | INTRAMUSCULAR | Status: DC

## 2022-04-15 MED ORDER — PANTOPRAZOLE SODIUM 40 MG PO TBEC
40.0000 mg | DELAYED_RELEASE_TABLET | Freq: Every day | ORAL | Status: DC
  Administered 2022-04-15: 40 mg via ORAL
  Filled 2022-04-15: qty 1

## 2022-04-15 MED ORDER — METHOCARBAMOL 1000 MG/10ML IJ SOLN
500.0000 mg | Freq: Three times a day (TID) | INTRAVENOUS | Status: DC | PRN
  Filled 2022-04-15: qty 5

## 2022-04-15 MED ORDER — KETAMINE HCL 50 MG/5ML IJ SOSY
0.3000 mg/kg | PREFILLED_SYRINGE | Freq: Once | INTRAMUSCULAR | Status: AC
  Administered 2022-04-15: 34 mg via INTRAVENOUS
  Filled 2022-04-15: qty 5

## 2022-04-15 MED ORDER — ACETAMINOPHEN 500 MG PO TABS
1000.0000 mg | ORAL_TABLET | Freq: Four times a day (QID) | ORAL | Status: DC
  Administered 2022-04-15 – 2022-04-21 (×21): 1000 mg via ORAL
  Filled 2022-04-15 (×23): qty 2

## 2022-04-15 MED ORDER — OXYCODONE HCL ER 15 MG PO T12A
15.0000 mg | EXTENDED_RELEASE_TABLET | Freq: Two times a day (BID) | ORAL | Status: DC
  Administered 2022-04-15 – 2022-04-16 (×4): 15 mg via ORAL
  Filled 2022-04-15 (×5): qty 1

## 2022-04-15 MED ORDER — NALOXONE HCL 0.4 MG/ML IJ SOLN: 0.4000 mg | INTRAMUSCULAR | Status: DC | PRN

## 2022-04-15 MED ORDER — NOREPINEPHRINE 4 MG/250ML-% IV SOLN
2.0000 ug/min | INTRAVENOUS | Status: DC
  Administered 2022-04-15: 2 ug/min via INTRAVENOUS
  Administered 2022-04-16: 6 ug/min via INTRAVENOUS
  Filled 2022-04-15 (×2): qty 250

## 2022-04-15 MED ORDER — OXYCODONE HCL 5 MG PO TABS
10.0000 mg | ORAL_TABLET | ORAL | Status: DC | PRN
  Administered 2022-04-15 (×2): 10 mg via ORAL
  Administered 2022-04-15: 15 mg via ORAL
  Administered 2022-04-15: 10 mg via ORAL
  Administered 2022-04-16: 5 mg via ORAL
  Administered 2022-04-16 (×4): 10 mg via ORAL
  Administered 2022-04-16 – 2022-04-18 (×7): 15 mg via ORAL
  Administered 2022-04-18: 10 mg via ORAL
  Administered 2022-04-18 – 2022-04-21 (×12): 15 mg via ORAL
  Filled 2022-04-15 (×8): qty 3
  Filled 2022-04-15 (×2): qty 2
  Filled 2022-04-15 (×6): qty 3
  Filled 2022-04-15 (×3): qty 2
  Filled 2022-04-15 (×2): qty 3
  Filled 2022-04-15: qty 2
  Filled 2022-04-15 (×5): qty 3
  Filled 2022-04-15: qty 2
  Filled 2022-04-15 (×2): qty 3

## 2022-04-15 MED ORDER — SODIUM CHLORIDE 0.9 % IV SOLN
250.0000 mL | INTRAVENOUS | Status: DC
  Administered 2022-04-15: 250 mL via INTRAVENOUS

## 2022-04-15 MED ORDER — ONDANSETRON HCL 4 MG/2ML IJ SOLN: 4.0000 mg | Freq: Four times a day (QID) | INTRAMUSCULAR | Status: DC | PRN

## 2022-04-15 MED ORDER — PANTOPRAZOLE SODIUM 40 MG IV SOLR: 40.0000 mg | Freq: Every day | INTRAVENOUS | Status: DC

## 2022-04-15 MED ORDER — DIPHENHYDRAMINE HCL 12.5 MG/5ML PO ELIX: 12.5000 mg | ORAL_SOLUTION | Freq: Four times a day (QID) | ORAL | Status: DC | PRN

## 2022-04-15 MED ORDER — IRBESARTAN 300 MG PO TABS
300.0000 mg | ORAL_TABLET | Freq: Every day | ORAL | Status: DC
  Administered 2022-04-15 – 2022-04-20 (×4): 300 mg via ORAL
  Filled 2022-04-15: qty 2
  Filled 2022-04-15 (×3): qty 1
  Filled 2022-04-15 (×2): qty 2

## 2022-04-15 MED ORDER — DULOXETINE HCL 20 MG PO CPEP
20.0000 mg | ORAL_CAPSULE | Freq: Two times a day (BID) | ORAL | Status: DC
  Administered 2022-04-15 – 2022-04-21 (×13): 20 mg via ORAL
  Filled 2022-04-15 (×14): qty 1

## 2022-04-15 MED ORDER — OXYCODONE HCL 5 MG PO TABS
5.0000 mg | ORAL_TABLET | Freq: Four times a day (QID) | ORAL | Status: DC | PRN
  Administered 2022-04-15: 10 mg via ORAL
  Filled 2022-04-15: qty 2

## 2022-04-15 MED ORDER — LACTATED RINGERS IV SOLN: INTRAVENOUS | Status: DC

## 2022-04-15 MED ORDER — DOCUSATE SODIUM 100 MG PO CAPS
100.0000 mg | ORAL_CAPSULE | Freq: Two times a day (BID) | ORAL | Status: DC
  Administered 2022-04-15 – 2022-04-21 (×12): 100 mg via ORAL
  Filled 2022-04-15 (×12): qty 1

## 2022-04-15 MED ORDER — HYDROMORPHONE 1 MG/ML IV SOLN
INTRAVENOUS | Status: DC
  Administered 2022-04-15: 5 mg via INTRAVENOUS
  Administered 2022-04-15: 30 mg via INTRAVENOUS
  Administered 2022-04-15: 2.7 mg via INTRAVENOUS
  Filled 2022-04-15: qty 30

## 2022-04-15 MED ORDER — ACETAMINOPHEN 500 MG PO TABS
1000.0000 mg | ORAL_TABLET | Freq: Three times a day (TID) | ORAL | Status: DC
  Administered 2022-04-15: 1000 mg via ORAL
  Filled 2022-04-15: qty 2

## 2022-04-15 MED ORDER — POLYETHYLENE GLYCOL 3350 17 G PO PACK: 17.0000 g | PACK | Freq: Every day | ORAL | Status: DC | PRN

## 2022-04-15 NOTE — Progress Notes (Signed)
Trauma/Critical Care Follow Up Note  Subjective:    Overnight Issues:   Objective:  Vital signs for last 24 hours: Temp:  [97.5 F (36.4 C)-98.2 F (36.8 C)] 98 F (36.7 C) (02/10 0400) Pulse Rate:  [58-110] 94 (02/10 0700) Resp:  [12-24] 15 (02/10 0700) BP: (104-134)/(64-89) 109/77 (02/10 0700) SpO2:  [90 %-100 %] 93 % (02/10 0700) Weight:  [113.4 kg] 113.4 kg (02/09 2108)  Hemodynamic parameters for last 24 hours:    Intake/Output from previous day: 02/09 0701 - 02/10 0700 In: 1225.7 [I.V.:225.7; IV Piggyback:1000] Out: -   Intake/Output this shift: No intake/output data recorded.  Vent settings for last 24 hours:    Physical Exam:  Gen: comfortable, no distress Neuro: non-focal exam HEENT: PERRL Neck: supple CV: RRR Pulm: unlabored breathing Abd: soft, NT GU: clear yellow urine Extr: wwp, no edema, RLE splinted and in sling   Results for orders placed or performed during the hospital encounter of 04/14/22 (from the past 24 hour(s))  Comprehensive metabolic panel     Status: Abnormal   Collection Time: 04/14/22  9:23 PM  Result Value Ref Range   Sodium 134 (L) 135 - 145 mmol/L   Potassium 4.1 3.5 - 5.1 mmol/L   Chloride 101 98 - 111 mmol/L   CO2 23 22 - 32 mmol/L   Glucose, Bld 141 (H) 70 - 99 mg/dL   BUN 22 (H) 6 - 20 mg/dL   Creatinine, Ser 1.33 (H) 0.61 - 1.24 mg/dL   Calcium 8.9 8.9 - 10.3 mg/dL   Total Protein 6.4 (L) 6.5 - 8.1 g/dL   Albumin 3.7 3.5 - 5.0 g/dL   AST 60 (H) 15 - 41 U/L   ALT 49 (H) 0 - 44 U/L   Alkaline Phosphatase 91 38 - 126 U/L   Total Bilirubin 0.7 0.3 - 1.2 mg/dL   GFR, Estimated >60 >60 mL/min   Anion gap 10 5 - 15  Ethanol     Status: None   Collection Time: 04/14/22  9:23 PM  Result Value Ref Range   Alcohol, Ethyl (B) <10 <10 mg/dL  Protime-INR     Status: None   Collection Time: 04/14/22  9:23 PM  Result Value Ref Range   Prothrombin Time 13.9 11.4 - 15.2 seconds   INR 1.1 0.8 - 1.2  Sample to Blood Bank      Status: None   Collection Time: 04/14/22  9:23 PM  Result Value Ref Range   Blood Bank Specimen SAMPLE AVAILABLE FOR TESTING    Sample Expiration      04/15/2022,2359 Performed at [facility], [address] [place], [place] [address]   I-Stat Chem 8, ED     Status: Abnormal   Collection Time: 04/14/22  9:32 PM  Result Value Ref Range   Sodium 136 135 - 145 mmol/L   Potassium 4.2 3.5 - 5.1 mmol/L   Chloride 102 98 - 111 mmol/L   BUN 24 (H) 6 - 20 mg/dL   Creatinine, Ser 1.30 (H) 0.61 - 1.24 mg/dL   Glucose, Bld 135 (H) 70 - 99 mg/dL   Calcium, Ion 1.12 (L) 1.15 - 1.40 mmol/L   TCO2 25 22 - 32 mmol/L   Hemoglobin 14.3 13.0 - 17.0 g/dL   HCT 42.0 39.0 - 52.0 %  Lactic acid, plasma     Status: None   Collection Time: 04/14/22  9:35 PM  Result Value Ref Range   Lactic Acid, Venous 1.9 0.5 -  1.9 mmol/L  CBC     Status: Abnormal   Collection Time: 04/15/22 12:55 AM  Result Value Ref Range   WBC 12.7 (H) 4.0 - 10.5 K/uL   RBC 4.16 (L) 4.22 - 5.81 MIL/uL   Hemoglobin 11.9 (L) 13.0 - 17.0 g/dL   HCT 36.9 (L) 39.0 - 52.0 %   MCV 88.7 80.0 - 100.0 fL   MCH 28.6 26.0 - 34.0 pg   MCHC 32.2 30.0 - 36.0 g/dL   RDW 13.5 11.5 - 15.5 %   Platelets 277 150 - 400 K/uL   nRBC 0.0 0.0 - 0.2 %  MRSA Next Gen by PCR, Nasal     Status: None   Collection Time: 04/15/22  4:00 AM   Specimen: Nasal Mucosa; Nasal Swab  Result Value Ref Range   MRSA by PCR Next Gen NOT DETECTED NOT DETECTED  Comprehensive metabolic panel     Status: Abnormal   Collection Time: 04/15/22  5:49 AM  Result Value Ref Range   Sodium 133 (L) 135 - 145 mmol/L   Potassium 4.4 3.5 - 5.1 mmol/L   Chloride 101 98 - 111 mmol/L   CO2 22 22 - 32 mmol/L   Glucose, Bld 124 (H) 70 - 99 mg/dL   BUN 20 6 - 20 mg/dL   Creatinine, Ser 0.95 0.61 - 1.24 mg/dL   Calcium 8.5 (L) 8.9 - 10.3 mg/dL   Total Protein 5.5 (L) 6.5 - 8.1 g/dL   Albumin 3.1 (L) 3.5 - 5.0 g/dL   AST 51 (H) 15 - 41 U/L   ALT 44 0 - 44 U/L   Alkaline  Phosphatase 77 38 - 126 U/L   Total Bilirubin 1.0 0.3 - 1.2 mg/dL   GFR, Estimated >60 >60 mL/min   Anion gap 10 5 - 15  CBC     Status: Abnormal   Collection Time: 04/15/22  5:49 AM  Result Value Ref Range   WBC 9.5 4.0 - 10.5 K/uL   RBC 4.01 (L) 4.22 - 5.81 MIL/uL   Hemoglobin 11.5 (L) 13.0 - 17.0 g/dL   HCT 34.3 (L) 39.0 - 52.0 %   MCV 85.5 80.0 - 100.0 fL   MCH 28.7 26.0 - 34.0 pg   MCHC 33.5 30.0 - 36.0 g/dL   RDW 13.3 11.5 - 15.5 %   Platelets 274 150 - 400 K/uL   nRBC 0.0 0.0 - 0.2 %    Assessment & Plan: The plan of care was discussed with the bedside nurse for the day, [name], who is in agreement with this plan and no additional concerns were raised.   Present on Admission:  Fall    LOS: 0 days   Additional comments:I reviewed the patient's new clinical lab test results.   and I reviewed the patients new imaging test results.    FFH  Right rib fxs 2-12 - pain control, add lidocaine patches, IS, pulm toilet Small R pneumothorax - repeat CXR pending Right comminuted scapula fx - ortho c/s pending, if indicated, earliest surgery would be next week Possible L scapula fx - ortho c/s pending, if indicated, earliest surgery would be next week Possible C7 spinous process fx - MRI c spine pending Right humeral fx - ortho c/s pending, if indicated, earliest surgery would be next week Right olecranon fx - ortho c/s pending, if indicated, earliest surgery would be next week Right possible radial neck fx - ortho c/s pending, if indicated, earliest surgery would be next week Right  lower back hematoma - monitor AKI - normalized with fluids, monitor Elevated transaminases - downtrending T12-L5 right TP fx - pain control Acute blood loss anemia - trend H/o gastric bypass at [place] 2008 - avoid nsaids  Chronic back pain on chronic narcotics - pain management by Dr. Suella Broad at [facility], excalate regimen to max tylenol, increase gabapenting, add lido patches, add  oxycontin, d/c PCA FEN - regular diet DVT - SCDs, LMWH 40BID Dispo - 4NP    Jesusita Oka, MD Trauma & General Surgery Please use [email or web address] to contact on call provider  04/15/2022  *Care during the described time interval was provided by me. I have reviewed this patient's available data, including medical history, events of note, physical examination and test results as part of my evaluation.

## 2022-04-15 NOTE — ED Notes (Signed)
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 04/14/22 2117 04/14/22 2215 04/14/22 2323  BP:  134/89 113/71 120/69  Pulse:  (!) 110 74 76  Resp:  (!) 22 17 17  $ Temp:  97.8 F (36.6 C)  98.2 F (36.8 C)  TempSrc:  Oral  Oral  SpO2:  97% 96% 100%  Weight: 113.4 kg     Height: 5' 10"$  (1.778 m)     PainSc:        Isolation Precautions No active isolations  Medications Medications  fentaNYL (SUBLIMAZE) 50 MCG/ML injection (has no administration in time range)  lactated ringers infusion (has no administration in time range)  ketamine 50 mg in normal saline 5 mL (10 mg/mL) syringe (10 mg Intravenous Given 04/14/22 2158)  ketamine 50 mg in normal saline 5 mL (10 mg/mL) syringe (23 mg Intravenous Given 04/14/22 2231)  iohexol (OMNIPAQUE) 350 MG/ML injection 100 mL (100 mLs Intravenous Contrast Given 04/14/22 2301)  lactated ringers bolus 1,000 mL (1,000 mLs Intravenous New Bag/Given 04/15/22 0041)  ketamine 50 mg in normal saline 5 mL (10 mg/mL) syringe (34 mg Intravenous Given  04/15/22 0039)    Mobility      Focused Assessments Neuro Assessment Handoff:  Swallow screen pass? Yes  Cardiac Rhythm: Normal sinus rhythm  Neuro Assessment:   Neuro Checks:      Has TPA been given? No If patient is a Neuro Trauma and patient is going to OR before floor call report to [place] nurse: [phone] or [phone]   R Recommendations: See Admitting Provider Note  Report given to:   Additional Notes: pt A and ) x 4. Cooperative but in pain
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
## 2022-04-15 NOTE — Progress Notes (Signed)
PCA turned off. Wasted 24 ml in steri cycle with Kem Kays, RN.

## 2022-04-15 NOTE — H&P (Signed)
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 D.O.   On: 04/14/2022 21:32    Imaging: Personally reviewed  A/P: Bradley [name] is an 52 y.o. male s/p fall H/[place] 2008 Chronic back pain on chronic narcotics - [facility] Right rib fxs 2-12 Right comminuted scapula fx Small Rt pneumothorax Possible Lt scapula fx Possible C7 spinous process fx Right humeral fx Right olecranon fx Right possible radial neck fx Right lower back hematoma Elevated creatinine Elevated transaminases T12-L5 right TP fx Acute blood loss anemia  ED PA called ortho for consult for scapula and RUE  Admint to hospital - inpt ICU Trend labs to monitor Cr and LFTs Avoid nsaids given h/o gastric bypass Repeat CXR in am F/u cbc Pulm toilet F/u ortho recs F/u MRI c spine Given degree of pain and difficulty controlling pain in ED - will admit to ICU  Pain control - pain control may be challenging.  Will start with PCA and some oral pain meds, cont home gabapentin; scheduled tylenol If has significant rib pain/pain control challenges - may need thoracic  consult to evaluate for rib plating Vte prophylaxis If Cr continue to increase - will need to evaluate his ARB blood pressure medication and lovenox NSG consult for numerous back TP fx -  NSG NP at [facility]  Data reviewed/work: Dr Ronnald Ramp pcp note 03/29/22; my office note 01/05/22, ED MD note 04/14/22, vitals in ED, labs 04/14/22, Labs 03/29/22, discussion of injury list and back history with NSG NP; discussion with ED PA regarding injuries and pain control issues; decision for ICU admission; chronic pain medication dependence/use affecting treatment plan and length of stay  High level MDM  Leighton Ruff. Redmond Pulling, MD, FACS General, Bariatric, & Minimally Invasive Surgery [facility]  [place]

## 2022-04-15 NOTE — Progress Notes (Signed)
Orthopedic Tech Progress Note Patient Details:  Bradley [name] November 11, 1970 [id number]  Ortho Devices Type of Ortho Device: Long arm splint, Shoulder immobilizer Ortho Device/Splint Location: RUE Ortho Device/Splint Interventions: Ordered, Application, Adjustment  Post Interventions Patient Tolerated: Poor  Joesph Marcy L Khristie Sak 04/15/2022, 4:59 AM

## 2022-04-15 NOTE — Consult Note (Signed)
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 Respiratory: No cyanosis, no use of accessory musculature Cardiovascular: No pedal edema GI: Abdomen is soft and non-tender, non-distended. Skin: Warm and dry.  Extremities: Warm and well perfused w/o edema Psychiatric: Patient is competent for consent with normal mood and affect  MUSCULOSKELETAL:  LUE is non-TTP, full ROM, NVI  RUE is in coaptation splint, able to wiggle fingers, intact grip strength, NVI  Other extremities are atraumatic with painless ROM and NVI.  Assessment / Plan: Principal Problem:   Fall    Have consulted with Dr. Griffin Basil about the R elbow fractures. He has posted surgery for Monday 04/17/22 for ORIF distal humerus. NPO at midnight on Sunday. Will likely non-op the scapula fractures but will defer final decision to Dr. Griffin Basil.   Weightbearing: NWB RUE , WBAT LUE Orthopedic device(s):  coaptation sling RUE VTE prophylaxis:  per primary   Pain control:  per primary Follow - up plan:  TBD Contact information:  Edmonia Lynch MD, [facility] PA-C  Britt Bottom PA-C Office [phone] 04/15/2022 10:03 AM

## 2022-04-16 LAB — CBC
HCT: 34.4 % — ABNORMAL LOW (ref 39.0–52.0)
Hemoglobin: 11.2 g/dL — ABNORMAL LOW (ref 13.0–17.0)
MCH: 28.2 pg (ref 26.0–34.0)
MCHC: 32.6 g/dL (ref 30.0–36.0)
MCV: 86.6 fL (ref 80.0–100.0)
Platelets: 280 10*3/uL (ref 150–400)
RBC: 3.97 MIL/uL — ABNORMAL LOW (ref 4.22–5.81)
RDW: 13.6 % (ref 11.5–15.5)
WBC: 9.8 10*3/uL (ref 4.0–10.5)
nRBC: 0 % (ref 0.0–0.2)

## 2022-04-16 LAB — BASIC METABOLIC PANEL
Anion gap: 11 (ref 5–15)
BUN: 21 mg/dL — ABNORMAL HIGH (ref 6–20)
CO2: 23 mmol/L (ref 22–32)
Calcium: 8.4 mg/dL — ABNORMAL LOW (ref 8.9–10.3)
Chloride: 101 mmol/L (ref 98–111)
Creatinine, Ser: 1.16 mg/dL (ref 0.61–1.24)
GFR, Estimated: >60 mL/min (ref >60)
Glucose, Bld: 137 mg/dL — ABNORMAL HIGH (ref 70–99)
Potassium: 4.4 mmol/L (ref 3.5–5.1)
Sodium: 135 mmol/L (ref 135–145)

## 2022-04-16 MED ORDER — ENOXAPARIN SODIUM 60 MG/0.6ML IJ SOSY
0.5000 mg/kg | PREFILLED_SYRINGE | Freq: Two times a day (BID) | INTRAMUSCULAR | Status: DC
  Filled 2022-04-16: qty 0.6

## 2022-04-16 MED ORDER — LACTATED RINGERS IV BOLUS
1000.0000 mL | Freq: Once | INTRAVENOUS | Status: AC
  Administered 2022-04-16: 1000 mL via INTRAVENOUS

## 2022-04-16 MED ORDER — METAXALONE 800 MG PO TABS
800.0000 mg | ORAL_TABLET | Freq: Four times a day (QID) | ORAL | Status: DC
  Administered 2022-04-16 – 2022-04-21 (×18): 800 mg via ORAL
  Filled 2022-04-16 (×22): qty 1

## 2022-04-16 MED ORDER — ENOXAPARIN SODIUM 40 MG/0.4ML IJ SOSY
40.0000 mg | PREFILLED_SYRINGE | Freq: Two times a day (BID) | INTRAMUSCULAR | Status: DC
  Administered 2022-04-16 – 2022-04-17 (×3): 40 mg via SUBCUTANEOUS
  Filled 2022-04-16 (×3): qty 0.4

## 2022-04-16 MED ORDER — MORPHINE SULFATE (PF) 4 MG/ML IV SOLN
4.0000 mg | INTRAVENOUS | Status: DC | PRN
  Administered 2022-04-16 (×2): 4 mg via INTRAVENOUS
  Filled 2022-04-16 (×2): qty 1

## 2022-04-16 NOTE — Progress Notes (Signed)
Bladder scan patient greater than 450 ml.  2 RN attempted I/O, unable to pass catheter.  Trauma MD paged.  Coude catheter inserted by Trauma MD at bedside.

## 2022-04-16 NOTE — Evaluation (Signed)
Physical Therapy Evaluation Patient Details Name: Bradley [name] MRN: [id number] DOB: 11-14-1970 Today's Date: 04/16/2022  History of Present Illness  OSSIAN LIEBOLD is a 52 y.o. male who fell through a roof. Imaging demonstrates multiple injuries:  R rib fxs 2-12, R pneumothorax, R scapula fx and possible L, C7 spinous process fx, R humeral fx, R olecranon fx, possible R radial neck fx, T12-L5 R TP fxs. PMHx: anxiety, arthritis, CAD, GERD, heart murmur, HTN, gastric bypass, R TKA   Clinical Impression  Pt admitted with above. Pt scheduled for OR tomorrow however was able to mobilize to EOB with maxAX2 today. Suspect pt will progress well enough to achieve supervision level of function s/p surgery when pain is under control however acute PT to continue to follow patient and reassess d/c recommendations s/p surgery. This date pt received half of his dose of IV morphine right before therapy mobilized him despite therapy's recommendation to wait until pt was up in chair to take due to high chance of BP dropping. Pt's BP dropped to 74/47 once EOB and became faint, less responsive with pale color requiring immediate return to supine. BP improved back up to 123/67. RN made aware and wife also present. Acute PT to continue to follow.     Recommendations for follow up therapy are one component of a multi-disciplinary discharge planning process, led by the attending physician.  Recommendations may be updated based on patient status, additional functional criteria and insurance authorization.  Follow Up Recommendations Home health PT (will reassess s/p surgery)      Assistance Recommended at Discharge Frequent or constant Supervision/Assistance  Patient can return home with the following  A lot of help with walking and/or transfers;A lot of help with bathing/dressing/bathroom;Assist for transportation;Help with stairs or ramp for entrance    Equipment Recommendations  (TBD)  Recommendations for Other  Services       Functional Status Assessment Patient has had a recent decline in their functional status and demonstrates the ability to make significant improvements in function in a reasonable and predictable amount of time.     Precautions / Restrictions Precautions Precautions: Fall;Cervical;Back Precaution Comments: back, cervical, R ribs,  RUE, L scap fxs Required Braces or Orthoses: Cervical Brace;Spinal Brace;Sling;Splint/Cast Cervical Brace: At all times Spinal Brace: Thoracolumbosacral orthotic (for comfort OOB) Splint/Cast: R long arm Restrictions Weight Bearing Restrictions: Yes RUE Weight Bearing: Non weight bearing      Mobility  Bed Mobility Overal bed mobility: Needs Assistance Bed Mobility: Rolling, Sidelying to Sit, Sit to Supine Rolling: Max assist, +2 for physical assistance, +2 for safety/equipment Sidelying to sit: Max assist, +2 for physical assistance, +2 for safety/equipment   Sit to supine: Total assist, +2 for physical assistance, +2 for safety/equipment   General bed mobility comments: increased time and cues needed to get to EOB; emergently brought back supine due to drop in BP    Transfers Overall transfer level: Needs assistance Equipment used: 2 person hand held assist Transfers: Sit to/from Stand Sit to Stand: Max assist, +2 physical assistance, +2 safety/equipment           General transfer comment: attempted lateral stepping at EOB; limited by orthostatic BP, pt with pale color and becoming less responsive    Ambulation/Gait               General Gait Details: limited by orthostatic bp  Stairs            Wheelchair Mobility    Modified Rankin (  Stroke Patients Only)       Balance Overall balance assessment: Needs assistance Sitting-balance support: Feet supported Sitting balance-Leahy Scale: Fair     Standing balance support: During functional activity, Bilateral upper extremity supported Standing  balance-Leahy Scale: Poor Standing balance comment: due to orthostatic BP                             Pertinent Vitals/Pain Pain Assessment Pain Assessment: Faces Faces Pain Scale: Hurts whole lot Pain Location: back, RUE Pain Descriptors / Indicators: Sharp Pain Intervention(s): Limited activity within patient's tolerance    Home Living Family/patient expects to be discharged to:: Private residence Living Arrangements: Spouse/significant other Available Help at Discharge: Family;Available 24 hours/day Type of Home: House Home Access: Stairs to enter Entrance Stairs-Rails: Psychiatric nurse of Steps: 4   Home Layout: Multi-level;Able to live on main level with bedroom/bathroom Home Equipment: Shower seat - built in;Cane - single point;Rolling Walker (2 wheels)      Prior Function Prior Level of Function : Independent/Modified Independent;Driving;Working/employed             Mobility Comments: no AD, went to the gym everyday ADLs Comments: indep     Hand Dominance   Dominant Hand: Right    Extremity/Trunk Assessment   Upper Extremity Assessment Upper Extremity Assessment: Defer to OT evaluation (R UE in long arm splint) RUE Deficits / Details: scapular, humerus, olecranon, radial fractures. NWB. long arm splint. plans for ORIF 2/12. edematous in the digits. declines numbness/tingling, good ROM of hand and wrist RUE Sensation: WNL RUE Coordination: decreased fine motor;decreased gross motor LUE Deficits / Details: Painful overhead ROM. able to use functionally for bed mobility. LUE Sensation: WNL LUE Coordination: decreased gross motor    Lower Extremity Assessment Lower Extremity Assessment: Overall WFL for tasks assessed    Cervical / Trunk Assessment Cervical / Trunk Assessment: Other exceptions Cervical / Trunk Exceptions: cervial, thoracic, lumbar and rib fractures  Communication   Communication: No difficulties  Cognition  Arousal/Alertness: Awake/alert Behavior During Therapy: WFL for tasks assessed/performed Overall Cognitive Status: Within Functional Limits for tasks assessed                                 General Comments: Overall WFL - decreased arousal once EOB, likely due to drop in BP. WOuld benefit form formal assessment        General Comments General comments (skin integrity, edema, etc.): wife present and supportive. BP dropped to 74/47 once EOB, pt did receive IV morphine prior to therapy mobilizing    Exercises     Assessment/Plan    PT Assessment Patient needs continued PT services  PT Problem List Decreased range of motion;Decreased strength;Decreased activity tolerance;Decreased balance;Decreased mobility;Decreased knowledge of use of DME;Decreased safety awareness       PT Treatment Interventions DME instruction;Gait training;Stair training;Functional mobility training;Therapeutic activities;Therapeutic exercise;Balance training;Neuromuscular re-education    PT Goals (Current goals can be found in the Care Plan section)  Acute Rehab PT Goals Patient Stated Goal: improve pain PT Goal Formulation: With patient/family Time For Goal Achievement: 04/30/22 Potential to Achieve Goals: Good    Frequency Min 4X/week     Co-evaluation PT/OT/SLP Co-Evaluation/Treatment: Yes Reason for Co-Treatment: Complexity of the patient's impairments (multi-system involvement);To address functional/ADL transfers;For patient/therapist safety PT goals addressed during session: Mobility/safety with mobility         AM-PAC  PT "6 Clicks" Mobility  Outcome Measure Help needed turning from your back to your side while in a flat bed without using bedrails?: A Lot Help needed moving from lying on your back to sitting on the side of a flat bed without using bedrails?: A Lot Help needed moving to and from a bed to a chair (including a wheelchair)?: A Lot Help needed standing up from a  chair using your arms (e.g., wheelchair or bedside chair)?: A Lot Help needed to walk in hospital room?: A Lot Help needed climbing 3-5 steps with a railing? : A Lot 6 Click Score: 12    End of Session Equipment Utilized During Treatment: Cervical collar (R UE sling) Activity Tolerance: Patient limited by pain Patient left: in bed;with call bell/phone within reach;with bed alarm set;with family/visitor present Nurse Communication: Mobility status (drop in BP s/p receiving IV morphine) PT Visit Diagnosis: Unsteadiness on feet (R26.81);Muscle weakness (generalized) (M62.81);Difficulty in walking, not elsewhere classified (R26.2)    Time: [id number] PT Time Calculation (min) (ACUTE ONLY): 40 min   Charges:   PT Evaluation $PT Eval Moderate Complexity: 1 Mod          Kittie Plater, PT, DPT Acute Rehabilitation Services Secure chat preferred Office #: [phone]   Berline Lopes 04/16/2022, 10:32 AM

## 2022-04-16 NOTE — Progress Notes (Signed)
    Subjective: Patient reports pain as marked. Not as well controlled today. Tolerating diet.  Urinating.   No CP, SOB.  Has mobilized OOB with PT but had some hypotension.   Objective:   VITALS:   Vitals:   04/16/22 0630 04/16/22 0645 04/16/22 0700 04/16/22 0800  BP: 98/81 120/75 128/80 117/71  Pulse: 87 85 85 80  Resp: 12 15 (!) 9 14  Temp:    98.1 F (36.7 C)  TempSrc:    Oral  SpO2: 96% 99% 97% 96%  Weight:      Height:          Latest Ref Rng & Units 04/16/2022    4:04 AM 04/15/2022    4:25 PM 04/15/2022    5:49 AM  CBC  WBC 4.0 - 10.5 K/uL 9.8  6.8  9.5   Hemoglobin 13.0 - 17.0 g/dL 11.2  10.4  11.5   Hematocrit 39.0 - 52.0 % 34.4  31.4  34.3   Platelets 150 - 400 K/uL 280  256  274       Latest Ref Rng & Units 04/16/2022    4:04 AM 04/15/2022    5:49 AM 04/14/2022    9:32 PM  BMP  Glucose 70 - 99 mg/dL 137  124  135   BUN 6 - 20 mg/dL 21  20  24    Creatinine 0.61 - 1.24 mg/dL 1.16  0.95  1.30   Sodium 135 - 145 mmol/L 135  133  136   Potassium 3.5 - 5.1 mmol/L 4.4  4.4  4.2   Chloride 98 - 111 mmol/L 101  101  102   CO2 22 - 32 mmol/L 23  22    Calcium 8.9 - 10.3 mg/dL 8.4  8.5     Intake/Output      02/10 0701 02/11 0700 02/11 0701 02/12 0700   I.V. (mL/kg) 553.6 (4.9) 30.6 (0.3)   IV Piggyback 2018.3    Total Intake(mL/kg) 2571.9 (22.7) 30.6 (0.3)   Urine (mL/kg/hr) 1725 (0.6)    Total Output 1725    Net +846.9 +30.6           Physical Exam: General: NAD.  Laying supine in bed, [place] J collar on, calm Resp: No increased wob Cardio: regular rate and rhythm ABD soft Neurologically intact MSK Neurovascularly intact Sensation intact distally Intact pulses distally Dorsiflexion/Plantar flexion intact RUE in coaptation splint and sling, able to wiggle fingers   Assessment:     Principal Problem:   Fall  Plan: Dr. Griffin Basil to do R elbow ORIF 04/17/22. NPO at midnight   Advance diet Incentive Spirometry Elevate and Apply  ice  Weightbearing: NWB RUE , WBAT LUE Orthopedic device(s): Splint and sling Showering: Keep splint dry VTE prophylaxis:  per primary  , SCDs Pain control: per primary Follow - up plan:  with Dr. Griffin Basil in the office  Contact information:  Edmonia Lynch MD, [facility] PA-C    [place], [place] Office [phone] 04/16/2022, 8:49 AM

## 2022-04-16 NOTE — Progress Notes (Signed)
An USGPIV (ultrasound guided PIV) has been placed for short-term vasopressor infusion. A correctly placed ivWatch must be used when administering Vasopressors. Should this treatment be needed beyond 72 hours, central line access should be obtained.  It will be the responsibility of the bedside nurse to follow best practice to prevent extravasations.   

## 2022-04-16 NOTE — Progress Notes (Signed)
Trauma/Critical Care Follow Up Note  Subjective:    Overnight Issues:   Objective:  Vital signs for last 24 hours: Temp:  [98.1 F (36.7 C)-98.7 F (37.1 C)] 98.1 F (36.7 C) (02/11 0800) Pulse Rate:  [66-113] 80 (02/11 0800) Resp:  [9-19] 14 (02/11 0800) BP: (70-142)/(41-99) 117/71 (02/11 0800) SpO2:  [81 %-100 %] 96 % (02/11 0800)  Hemodynamic parameters for last 24 hours:    Intake/Output from previous day: 02/10 0701 - 02/11 0700 In: 2571.9 [I.V.:553.6; IV Piggyback:2018.3] Out: 1725 [Urine:1725]  Intake/Output this shift: Total I/O In: 30.6 [I.V.:30.6] Out: -   Vent settings for last 24 hours:    Physical Exam:  Gen: comfortable, no distress Neuro: non-focal exam HEENT: PERRL Neck: supple CV: RRR Pulm: unlabored breathing Abd: soft, NT GU: clear yellow urine Extr: wwp, no edema   Results for orders placed or performed during the hospital encounter of 04/14/22 (from the past 24 hour(s))  CBC with Differential/Platelet     Status: Abnormal   Collection Time: 04/15/22  4:25 PM  Result Value Ref Range   WBC 6.8 4.0 - 10.5 K/uL   RBC 3.64 (L) 4.22 - 5.81 MIL/uL   Hemoglobin 10.4 (L) 13.0 - 17.0 g/dL   HCT 31.4 (L) 39.0 - 52.0 %   MCV 86.3 80.0 - 100.0 fL   MCH 28.6 26.0 - 34.0 pg   MCHC 33.1 30.0 - 36.0 g/dL   RDW 13.6 11.5 - 15.5 %   Platelets 256 150 - 400 K/uL   nRBC 0.0 0.0 - 0.2 %   Neutrophils Relative % 55 %   Neutro Abs 3.8 1.7 - 7.7 K/uL   Lymphocytes Relative 31 %   Lymphs Abs 2.1 0.7 - 4.0 K/uL   Monocytes Relative 13 %   Monocytes Absolute 0.9 0.1 - 1.0 K/uL   Eosinophils Relative 1 %   Eosinophils Absolute 0.1 0.0 - 0.5 K/uL   Basophils Relative 0 %   Basophils Absolute 0.0 0.0 - 0.1 K/uL   Immature Granulocytes 0 %   Abs Immature Granulocytes 0.02 0.00 - 0.07 K/uL  CBC     Status: Abnormal   Collection Time: 04/16/22  4:04 AM  Result Value Ref Range   WBC 9.8 4.0 - 10.5 K/uL   RBC 3.97 (L) 4.22 - 5.81 MIL/uL   Hemoglobin  11.2 (L) 13.0 - 17.0 g/dL   HCT 34.4 (L) 39.0 - 52.0 %   MCV 86.6 80.0 - 100.0 fL   MCH 28.2 26.0 - 34.0 pg   MCHC 32.6 30.0 - 36.0 g/dL   RDW 13.6 11.5 - 15.5 %   Platelets 280 150 - 400 K/uL   nRBC 0.0 0.0 - 0.2 %  Basic metabolic panel     Status: Abnormal   Collection Time: 04/16/22  4:04 AM  Result Value Ref Range   Sodium 135 135 - 145 mmol/L   Potassium 4.4 3.5 - 5.1 mmol/L   Chloride 101 98 - 111 mmol/L   CO2 23 22 - 32 mmol/L   Glucose, Bld 137 (H) 70 - 99 mg/dL   BUN 21 (H) 6 - 20 mg/dL   Creatinine, Ser 1.16 0.61 - 1.24 mg/dL   Calcium 8.4 (L) 8.9 - 10.3 mg/dL   GFR, Estimated >60 >60 mL/min   Anion gap 11 5 - 15    Assessment & Plan: Present on Admission:  Fall    LOS: 1 day   Additional comments:I reviewed the patient's new clinical lab  test results.   and I reviewed the patients new imaging test results.    FFH   Right rib fxs 2-12 - pain control, IS, pulm toilet Small R pneumothorax - repeat CXR stable  Right comminuted scapula fx - ortho c/s, decision about surgical management pending Possible L scapula fx - ortho c/s, Dr. Percell Miller, WBAT Compression fx of C7, T3-5 - NSGY c/s, possible MRI T-spine Right humeral fx - ortho c/s, Dr. Griffin Basil, OR 2/12   Right olecranon fx - ortho c/s, NWB Right possible radial neck fx - ortho c/s, NWB Right lower back hematoma - monitor AKI - normalized with fluids, monitor Elevated transaminases - downtrending T12-L5 right TP fx - pain control Acute blood loss anemia - trend R ear pain - will look with otoscope Shock - low dose levo, unclear etiology, give 1L bolus, hgb stable H/o gastric bypass at [place] 2008 - avoid nsaids  Chronic back pain on chronic narcotics - pain management by Dr. Suella Broad at [facility], escalate regimen to max tylenol, increase gabapentin, lido patches, add oxycontin FEN - regular diet DVT - SCDs, LMWH 40BID Dispo - 4NP   Critical Care Total Time: 35 minutes  Jesusita Oka,  MD Trauma & General Surgery Please use [email or web address] to contact on call provider  04/16/2022  *Care during the described time interval was provided by me. I have reviewed this patient's available data, including medical history, events of note, physical examination and test results as part of my evaluation.

## 2022-04-16 NOTE — TOC CAGE-AID Note (Signed)
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 Patient denies any alcohol or drug abuse. Per patient pain can sometimes be an issue for him, he is managed by a pain clinic for chronic lower back pain and takes 74m oxycodone 5x per day. Patient denies need for substance abuse resources at this time.  CAGE-AID Screening:   Have You Ever Felt You Ought to Cut Down on Your Drinking or Drug Use?: No Have People Annoyed You By Critizing Your Drinking Or Drug Use?: No Have You Felt Bad Or Guilty About Your Drinking Or Drug Use?: No Have You Ever Had a Drink or Used Drugs First Thing In The Morning to Steady Your Nerves or to Get Rid of a Hangover?: No CAGE-AID Score: 0  Substance Abuse Education Offered: No

## 2022-04-16 NOTE — Evaluation (Signed)
Occupational Therapy Evaluation Patient Details Name: Bradley [name] MRN: [id number] DOB: 1970/09/12 Today's Date: 04/16/2022   History of Present Illness Bradley [name] is a 52 y.o. male who fell through a roof. Imaging demonstrates multiple injuries:  R rib fxs 2-12, R pneumothorax, R scapula fx and possible L, C7 spinous process fx, R humeral fx, R olecranon fx, possible R radial neck fx, T12-L5 R TP fxs. PMHx: anxiety, arthritis, CAD, GERD, heart murmur, HTN, gastric bypass, R TKA   Clinical Impression   [name] was evaluated s/p the above admission list, he is indep at baseline and lives with his wife who can assist at baseline. Upon evaluation pt was limited by pain, orthostatic BP, multiple precautions and fractures, poor activity tolerance, balance and RHD immobilization. Overall he required max A +2 to get to the EOB, increased time and cues needed. Once EOB, pt became less responsive with low BP. He tolerated standing with max A +2 but unable to laterally step; pt dependent returned to supine with immediate recovery in arousal. Weight bearing and back precaution education provided throughout. Due to the deficits listed below, he requires up to max A for UB ADLs and total A for LB ADLs. OT to continue to follow acutely. Anticipate good progress acutely, recommend pt follow the physicians plans for therapy (OP OT vs. HH).   Supine 105/74 Sitting EOB 72/49 (56) Supine 123/79 Supine after 5 minutes 126/74     Recommendations for follow up therapy are one component of a multi-disciplinary discharge planning process, led by the attending physician.  Recommendations may be updated based on patient status, additional functional criteria and insurance authorization.   Follow Up Recommendations  Follow physician's recommendations for discharge plan and follow up therapies (Anticipate pt will mobilize well acutely and be able to d/c home)     Assistance Recommended at Discharge Frequent or  constant Supervision/Assistance  Patient can return home with the following A little help with walking and/or transfers;A lot of help with bathing/dressing/bathroom;Assistance with cooking/housework;Assist for transportation;Help with stairs or ramp for entrance    Functional Status Assessment  Patient has had a recent decline in their functional status and demonstrates the ability to make significant improvements in function in a reasonable and predictable amount of time.  Equipment Recommendations  Other (comment) (pending progression acutely)       Precautions / Restrictions Precautions Precautions: Fall;Cervical;Back Precaution Comments: back, cervical, R ribs,  RUE, L scap fxs Required Braces or Orthoses: Cervical Brace;Spinal Brace;Sling;Splint/Cast Cervical Brace: At all times Spinal Brace: Thoracolumbosacral orthotic (for comfort OOB) Splint/Cast: R long arm Restrictions Weight Bearing Restrictions: Yes RUE Weight Bearing: Non weight bearing      Mobility Bed Mobility Overal bed mobility: Needs Assistance Bed Mobility: Rolling, Sidelying to Sit, Sit to Supine Rolling: Max assist, +2 for physical assistance, +2 for safety/equipment Sidelying to sit: Max assist, +2 for physical assistance, +2 for safety/equipment   Sit to supine: Total assist, +2 for physical assistance, +2 for safety/equipment   General bed mobility comments: increased time and cues needed to get to EOB; emergently brought back supine due to drop in BP    Transfers Overall transfer level: Needs assistance Equipment used: 2 person hand held assist Transfers: Sit to/from Stand Sit to Stand: Max assist, +2 physical assistance, +2 safety/equipment           General transfer comment: attempted lateral stepping at EOB; limited by orthostatic BP      Balance Overall balance assessment: Needs assistance  Sitting-balance support: Feet supported Sitting balance-Leahy Scale: Fair     Standing balance  support: During functional activity, Bilateral upper extremity supported Standing balance-Leahy Scale: Poor                             ADL either performed or assessed with clinical judgement   ADL Overall ADL's : Needs assistance/impaired Eating/Feeding: Minimal assistance;Sitting   Grooming: Minimal assistance;Sitting   Upper Body Bathing: Maximal assistance;Sitting   Lower Body Bathing: Total assistance;Bed level   Upper Body Dressing : Maximal assistance;Sitting   Lower Body Dressing: Total assistance;Sit to/from stand   Toilet Transfer: Total assistance   Toileting- Clothing Manipulation and Hygiene: Total assistance       Functional mobility during ADLs: Maximal assistance;+2 for physical assistance;+2 for safety/equipment General ADL Comments: max A to EOB, standing limited by drop in BP     Vision Baseline Vision/History: 0 No visual deficits Vision Assessment?: No apparent visual deficits     Perception Perception Perception Tested?: No   Praxis Praxis Praxis tested?: Not tested    Pertinent Vitals/Pain Pain Assessment Pain Assessment: Faces Faces Pain Scale: Hurts whole lot Pain Location: back, RUE Pain Descriptors / Indicators: Sharp Pain Intervention(s): Limited activity within patient's tolerance, Monitored during session, Premedicated before session, Repositioned, Patient requesting pain meds-RN notified, RN gave pain meds during session     Hand Dominance Right   Extremity/Trunk Assessment Upper Extremity Assessment Upper Extremity Assessment: RUE deficits/detail;LUE deficits/detail RUE Deficits / Details: scapular, humerus, olecranon, radial fractures. NWB. long arm splint. plans for ORIF 2/12. edematous in the digits. declines numbness/tingling, good ROM of hand and wrist RUE Sensation: WNL RUE Coordination: decreased fine motor;decreased gross motor LUE Deficits / Details: Painful overhead ROM. able to use functionally for bed  mobility. LUE Sensation: WNL LUE Coordination: decreased gross motor   Lower Extremity Assessment Lower Extremity Assessment: Defer to PT evaluation   Cervical / Trunk Assessment Cervical / Trunk Assessment: Other exceptions Cervical / Trunk Exceptions: cervial, thoracic, lumbar and rib fractures   Communication Communication Communication: No difficulties   Cognition Arousal/Alertness: Awake/alert Behavior During Therapy: WFL for tasks assessed/performed Overall Cognitive Status: Within Functional Limits for tasks assessed                                 General Comments: Overall WFL - decreased arousal once EOB, likely due to drop in BP. WOuld benefit form formal assessment     General Comments  wife present and supportic. significant drop in BP once sitting EOB.     Home Living Family/patient expects to be discharged to:: Private residence Living Arrangements: Spouse/significant other Available Help at Discharge: Family;Available 24 hours/day Type of Home: House Home Access: Stairs to enter [employer] of Steps: 4 Entrance Stairs-Rails: Right;Left Home Layout: Multi-level;Able to live on main level with bedroom/bathroom     Bathroom Shower/Tub: Occupational psychologist: Handicapped height Bathroom Accessibility: Yes   Home Equipment: Shower seat - built in;Cane - single [employer]ista (2 wheels)          Prior Functioning/Environment Prior Level of Function : Independent/Modified Independent;Driving;Working/employed             Mobility Comments: no AD, went to the gym everyday ADLs Comments: indep        OT Problem List: Decreased strength;Decreased range of motion;Decreased activity tolerance;Impaired balance (sitting and/or  standing);Decreased safety awareness;Decreased knowledge of use of DME or AE;Decreased knowledge of precautions;Impaired UE functional use;Pain;Increased edema      OT  Treatment/Interventions: Self-care/ADL training;Therapeutic exercise;DME and/or AE instruction;Therapeutic activities;Patient/family education    OT Goals(Current goals can be found in the care plan section) Acute Rehab OT Goals Patient Stated Goal: less pain OT Goal Formulation: With patient Time For Goal Achievement: 04/30/22 Potential to Achieve Goals: Good ADL Goals Pt Will Perform Grooming: with set-up;sitting Pt Will Perform Upper Body Dressing: with min assist;sitting Pt Will Perform Lower Body Dressing: sit to/from stand;with min assist Pt Will Transfer to Toilet: with min assist;squat pivot transfer;bedside commode Pt Will Perform Toileting - Clothing Manipulation and hygiene: with min assist;sitting/lateral leans Additional ADL Goal #1: Pt will complete bed mobility with min A as a precursor to ADLs  OT Frequency: Min 3X/week    Co-evaluation PT/OT/SLP Co-Evaluation/Treatment: Yes Reason for Co-Treatment: Complexity of the patient's impairments (multi-system involvement);To address functional/ADL transfers;For patient/therapist safety          AM-PAC OT "6 Clicks" Daily Activity     Outcome Measure Help from another person eating meals?: A Little Help from another person taking care of personal grooming?: A Little Help from another person toileting, which includes using toliet, bedpan, or urinal?: Total Help from another person bathing (including washing, rinsing, drying)?: A Lot Help from another person to put on and taking off regular upper body clothing?: A Lot Help from another person to put on and taking off regular lower body clothing?: Total 6 Click Score: 12   End of Session Equipment Utilized During Treatment: Cervical collar;Other (comment) (sling) Nurse Communication: Mobility status;Other (comment) (vitals)  Activity Tolerance: Patient limited by pain Patient left: in bed;with call bell/phone within reach;with bed alarm set;with family/visitor present  OT  Visit Diagnosis: Unsteadiness on feet (R26.81);Other abnormalities of gait and mobility (R26.89);Muscle weakness (generalized) (M62.81);Pain                Time: [id number] OT Time Calculation (min): 40 min Charges:  OT General Charges $OT Visit: 1 Visit OT Evaluation $OT Eval Moderate Complexity: 1 Mod OT Treatments $Therapeutic Activity: 8-22 mins  Shade Flood, OTR/L Acute Rehabilitation Services Office [phone] Secure Chat Communication Preferred   Elliot Cousin 04/16/2022, 9:51 AM

## 2022-04-16 NOTE — Progress Notes (Signed)
NEUROSURGERY PROGRESS NOTE  Doing well. Complains of appropriate neck and back soreness. No arm or leg  pain No numbness, tingling or weakness Good strength and sensation MRI c spine shows some mild compression fractures of C7 T3 T4 and T5- continue collar for now. TLSO brace only for comfort.   Temp:  [98.1 F (36.7 C)-98.7 F (37.1 C)] 98.1 F (36.7 C) (02/11 0800) Pulse Rate:  [66-113] 80 (02/11 0800) Resp:  [9-19] 14 (02/11 0800) BP: (70-142)/(41-99) 117/71 (02/11 0800) SpO2:  [81 %-100 %] 96 % (02/11 0800)  Plan: Continue therapy for now, no new nsgy recom.  Eleonore Chiquito, NP 04/16/2022 8:51 AM

## 2022-04-16 NOTE — Progress Notes (Signed)
Orthopedic Tech Progress Note Patient Details:  Bradley [name] December 17, 1970 [id number]  Ortho Devices Type of Ortho Device: Thoracolumbar corset (TLSO) Ortho Device/Splint Location: Back Ortho Device/Splint Interventions: Ordered, Adjustment   Post Interventions Patient Tolerated: Poor Instructions Provided: Adjustment of device, Care of device  Bradley [name] 04/16/2022, 7:55 AM

## 2022-04-17 ENCOUNTER — Other Ambulatory Visit: Payer: Self-pay

## 2022-04-17 ENCOUNTER — Inpatient Hospital Stay (HOSPITAL_COMMUNITY): Payer: Medicare Other

## 2022-04-17 ENCOUNTER — Inpatient Hospital Stay (HOSPITAL_COMMUNITY): Payer: Medicare Other | Admitting: Anesthesiology

## 2022-04-17 ENCOUNTER — Encounter (HOSPITAL_COMMUNITY): Payer: Self-pay

## 2022-04-17 ENCOUNTER — Encounter (HOSPITAL_COMMUNITY): Admission: EM | Disposition: A | Discharge status: Rehabilitation Facility | Payer: Self-pay | Source: Home / Self Care

## 2022-04-17 DIAGNOSIS — I119 Hypertensive heart disease without heart failure: Secondary | ICD-10-CM

## 2022-04-17 DIAGNOSIS — S42401A Unspecified fracture of lower end of right humerus, initial encounter for closed fracture: Secondary | ICD-10-CM

## 2022-04-17 DIAGNOSIS — I2510 Atherosclerotic heart disease of native coronary artery without angina pectoris: Secondary | ICD-10-CM

## 2022-04-17 DIAGNOSIS — I350 Nonrheumatic aortic (valve) stenosis: Secondary | ICD-10-CM

## 2022-04-17 HISTORY — PX: ORIF HUMERUS FRACTURE: SHX2126

## 2022-04-17 LAB — CBC
HCT: 29.6 % — ABNORMAL LOW (ref 39.0–52.0)
Hemoglobin: 9.4 g/dL — ABNORMAL LOW (ref 13.0–17.0)
MCH: 28.4 pg (ref 26.0–34.0)
MCHC: 31.8 g/dL (ref 30.0–36.0)
MCV: 89.4 fL (ref 80.0–100.0)
Platelets: 188 10*3/uL (ref 150–400)
RBC: 3.31 MIL/uL — ABNORMAL LOW (ref 4.22–5.81)
RDW: 13.7 % (ref 11.5–15.5)
WBC: 6 10*3/uL (ref 4.0–10.5)
nRBC: 0 % (ref 0.0–0.2)

## 2022-04-17 LAB — BASIC METABOLIC PANEL
Anion gap: 7 (ref 5–15)
BUN: 22 mg/dL — ABNORMAL HIGH (ref 6–20)
CO2: 25 mmol/L (ref 22–32)
Calcium: 8 mg/dL — ABNORMAL LOW (ref 8.9–10.3)
Chloride: 102 mmol/L (ref 98–111)
Creatinine, Ser: 1.03 mg/dL (ref 0.61–1.24)
GFR, Estimated: >60 mL/min (ref >60)
Glucose, Bld: 99 mg/dL (ref 70–99)
Potassium: 4.3 mmol/L (ref 3.5–5.1)
Sodium: 134 mmol/L — ABNORMAL LOW (ref 135–145)

## 2022-04-17 SURGERY — OPEN REDUCTION INTERNAL FIXATION (ORIF) PROXIMAL HUMERUS FRACTURE
Anesthesia: General | Site: Arm Upper | Laterality: Right

## 2022-04-17 MED ORDER — LIDOCAINE 2% (20 MG/ML) 5 ML SYRINGE
INTRAMUSCULAR | Status: DC | PRN
  Administered 2022-04-17: 40 mg via INTRAVENOUS

## 2022-04-17 MED ORDER — ONDANSETRON HCL 4 MG/2ML IJ SOLN
INTRAMUSCULAR | Status: DC | PRN
  Administered 2022-04-17: 4 mg via INTRAVENOUS

## 2022-04-17 MED ORDER — GABAPENTIN 300 MG PO CAPS
600.0000 mg | ORAL_CAPSULE | Freq: Three times a day (TID) | ORAL | Status: DC
  Administered 2022-04-17 – 2022-04-18 (×4): 600 mg via ORAL
  Filled 2022-04-17 (×7): qty 2

## 2022-04-17 MED ORDER — VANCOMYCIN HCL 1000 MG IV SOLR
INTRAVENOUS | Status: DC | PRN
  Administered 2022-04-17: 1000 mg via TOPICAL

## 2022-04-17 MED ORDER — CEFAZOLIN SODIUM 1 G IJ SOLR
INTRAMUSCULAR | Status: AC
  Filled 2022-04-17: qty 20

## 2022-04-17 MED ORDER — ROCURONIUM BROMIDE 10 MG/ML (PF) SYRINGE
PREFILLED_SYRINGE | INTRAVENOUS | Status: DC | PRN
  Administered 2022-04-17: 55 mg via INTRAVENOUS
  Administered 2022-04-17: 15 mg via INTRAVENOUS

## 2022-04-17 MED ORDER — LACTATED RINGERS IV BOLUS
1000.0000 mL | Freq: Once | INTRAVENOUS | Status: AC
  Administered 2022-04-17: 1000 mL via INTRAVENOUS

## 2022-04-17 MED ORDER — VANCOMYCIN HCL 1000 MG IV SOLR
INTRAVENOUS | Status: AC
  Filled 2022-04-17: qty 20

## 2022-04-17 MED ORDER — ACETAMINOPHEN 500 MG PO TABS
1000.0000 mg | ORAL_TABLET | Freq: Once | ORAL | Status: AC
  Administered 2022-04-17: 1000 mg via ORAL
  Filled 2022-04-17: qty 2

## 2022-04-17 MED ORDER — SUGAMMADEX SODIUM 200 MG/2ML IV SOLN
INTRAVENOUS | Status: DC | PRN
  Administered 2022-04-17: 200 mg via INTRAVENOUS

## 2022-04-17 MED ORDER — SODIUM CHLORIDE (PF) 0.9 % IJ SOLN
INTRAMUSCULAR | Status: AC
  Filled 2022-04-17: qty 30

## 2022-04-17 MED ORDER — SODIUM CHLORIDE 0.9 % IV BOLUS
1000.0000 mL | Freq: Once | INTRAVENOUS | Status: AC
  Administered 2022-04-17: 1000 mL via INTRAVENOUS

## 2022-04-17 MED ORDER — ENOXAPARIN SODIUM 40 MG/0.4ML IJ SOSY
40.0000 mg | PREFILLED_SYRINGE | Freq: Two times a day (BID) | INTRAMUSCULAR | Status: DC
  Administered 2022-04-18 – 2022-04-21 (×7): 40 mg via SUBCUTANEOUS
  Filled 2022-04-17 (×7): qty 0.4

## 2022-04-17 MED ORDER — DEXAMETHASONE SODIUM PHOSPHATE 10 MG/ML IJ SOLN
INTRAMUSCULAR | Status: DC | PRN
  Administered 2022-04-17: 5 mg via INTRAVENOUS

## 2022-04-17 MED ORDER — PHENYLEPHRINE 80 MCG/ML (10ML) SYRINGE FOR IV PUSH (FOR BLOOD PRESSURE SUPPORT)
PREFILLED_SYRINGE | INTRAVENOUS | Status: DC | PRN
  Administered 2022-04-17: 80 ug via INTRAVENOUS
  Administered 2022-04-17: 160 ug via INTRAVENOUS
  Administered 2022-04-17: 80 ug via INTRAVENOUS

## 2022-04-17 MED ORDER — MIDAZOLAM HCL 5 MG/5ML IJ SOLN
INTRAMUSCULAR | Status: DC | PRN
  Administered 2022-04-17: 1 mg via INTRAVENOUS

## 2022-04-17 MED ORDER — PROPOFOL 10 MG/ML IV BOLUS
INTRAVENOUS | Status: AC
  Filled 2022-04-17: qty 20

## 2022-04-17 MED ORDER — FENTANYL CITRATE (PF) 100 MCG/2ML IJ SOLN
INTRAMUSCULAR | Status: AC
  Filled 2022-04-17: qty 2

## 2022-04-17 MED ORDER — BUPIVACAINE HCL (PF) 0.25 % IJ SOLN
INTRAMUSCULAR | Status: AC
  Filled 2022-04-17: qty 30

## 2022-04-17 MED ORDER — CHLORHEXIDINE GLUCONATE 0.12 % MT SOLN
OROMUCOSAL | Status: AC
  Administered 2022-04-17: 15 mL via OROMUCOSAL
  Filled 2022-04-17: qty 15

## 2022-04-17 MED ORDER — LACTATED RINGERS IV SOLN: INTRAVENOUS | Status: DC

## 2022-04-17 MED ORDER — FENTANYL CITRATE (PF) 250 MCG/5ML IJ SOLN
INTRAMUSCULAR | Status: DC | PRN
  Administered 2022-04-17: 50 ug via INTRAVENOUS

## 2022-04-17 MED ORDER — MIDAZOLAM HCL 2 MG/2ML IJ SOLN
INTRAMUSCULAR | Status: AC
  Administered 2022-04-17: 1 mg via INTRAVENOUS
  Filled 2022-04-17: qty 2

## 2022-04-17 MED ORDER — PHENYLEPHRINE HCL-NACL 20-0.9 MG/250ML-% IV SOLN
INTRAVENOUS | Status: DC | PRN
  Administered 2022-04-17: 25 ug/min via INTRAVENOUS

## 2022-04-17 MED ORDER — FENTANYL CITRATE (PF) 250 MCG/5ML IJ SOLN
INTRAMUSCULAR | Status: AC
  Filled 2022-04-17: qty 5

## 2022-04-17 MED ORDER — PROPOFOL 10 MG/ML IV BOLUS
INTRAVENOUS | Status: DC | PRN
  Administered 2022-04-17: 110 mg via INTRAVENOUS

## 2022-04-17 MED ORDER — 0.9 % SODIUM CHLORIDE (POUR BTL) OPTIME
TOPICAL | Status: DC | PRN
  Administered 2022-04-17: 1000 mL

## 2022-04-17 MED ORDER — BUPIVACAINE-EPINEPHRINE (PF) 0.5% -1:200000 IJ SOLN
INTRAMUSCULAR | Status: DC | PRN
  Administered 2022-04-17: 30 mL via PERINEURAL

## 2022-04-17 MED ORDER — DEXAMETHASONE SODIUM PHOSPHATE 10 MG/ML IJ SOLN
INTRAMUSCULAR | Status: AC
  Filled 2022-04-17: qty 1

## 2022-04-17 MED ORDER — BUPIVACAINE HCL (PF) 0.5 % IJ SOLN
INTRAMUSCULAR | Status: AC
  Filled 2022-04-17: qty 30

## 2022-04-17 MED ORDER — CEFAZOLIN SODIUM-DEXTROSE 2-3 GM-%(50ML) IV SOLR
INTRAVENOUS | Status: DC | PRN
  Administered 2022-04-17: 2 g via INTRAVENOUS

## 2022-04-17 MED ORDER — ORAL CARE MOUTH RINSE: 15.0000 mL | Freq: Once | OROMUCOSAL | Status: AC

## 2022-04-17 MED ORDER — CHLORHEXIDINE GLUCONATE 0.12 % MT SOLN: 15.0000 mL | Freq: Once | OROMUCOSAL | Status: AC

## 2022-04-17 MED ORDER — CEFAZOLIN SODIUM-DEXTROSE 2-4 GM/100ML-% IV SOLN
2.0000 g | Freq: Four times a day (QID) | INTRAVENOUS | Status: AC
  Administered 2022-04-17 – 2022-04-18 (×3): 2 g via INTRAVENOUS
  Filled 2022-04-17 (×3): qty 100

## 2022-04-17 MED ORDER — MIDAZOLAM HCL 2 MG/2ML IJ SOLN
INTRAMUSCULAR | Status: AC
  Filled 2022-04-17: qty 2

## 2022-04-17 MED ORDER — MIDAZOLAM HCL 2 MG/2ML IJ SOLN: 1.0000 mg | Freq: Once | INTRAMUSCULAR | Status: AC

## 2022-04-17 MED ORDER — ONDANSETRON HCL 4 MG/2ML IJ SOLN
INTRAMUSCULAR | Status: AC
  Filled 2022-04-17: qty 2

## 2022-04-17 MED ORDER — OXYCODONE HCL ER 10 MG PO T12A
20.0000 mg | EXTENDED_RELEASE_TABLET | Freq: Two times a day (BID) | ORAL | Status: DC
  Administered 2022-04-17 – 2022-04-18 (×4): 20 mg via ORAL
  Filled 2022-04-17 (×5): qty 2

## 2022-04-17 SURGICAL SUPPLY — 111 items
AID PSTN UNV HD RSTRNT DISP (MISCELLANEOUS)
APL PRP STRL LF DISP 70% ISPRP (MISCELLANEOUS)
BAG COUNTER SPONGE SURGICOUNT (BAG) ×2 IMPLANT
BAG SPNG CNTER NS LX DISP (BAG) ×2
BIT DRILL 2.5MM SMALL QC EVOS (BIT) ×1 IMPLANT
BIT DRILL 2.7MM OVERBIT QC (BIT) ×1 IMPLANT
BIT DRILL 3.2 QUICK MINI 300 (DRILL) ×1 IMPLANT
BIT DRILL 5.0 QC 6.5 (BIT) ×1 IMPLANT
BIT DRILL OVR 3.5AO QC SHRT SM (DRILL) ×1 IMPLANT
BIT DRILL QC 2.0 SHORT EVOS SM (DRILL) ×1 IMPLANT
BIT DRILL QC 2.5MM SHRT EVO SM (DRILL) ×1 IMPLANT
BLADE LONG MED 31X9 (MISCELLANEOUS) ×1 IMPLANT
BNDG ELASTIC 4X5.8 VLCR STR LF (GAUZE/BANDAGES/DRESSINGS) ×1 IMPLANT
CHLORAPREP W/TINT 26 (MISCELLANEOUS) ×1 IMPLANT
COVER SURGICAL LIGHT HANDLE (MISCELLANEOUS) ×3 IMPLANT
DRAPE C-ARM 42X72 X-RAY (DRAPES) ×2 IMPLANT
DRAPE C-ARMOR (DRAPES) ×1 IMPLANT
DRAPE HALF SHEET 40X57 (DRAPES) ×2 IMPLANT
DRAPE IMP U-DRAPE 54X76 (DRAPES) ×2 IMPLANT
DRAPE INCISE IOBAN 66X45 STRL (DRAPES) ×2 IMPLANT
DRAPE ORTHO SPLIT 77X108 STRL (DRAPES) ×4
DRAPE SHEET LG 3/4 BI-LAMINATE (DRAPES) ×1 IMPLANT
DRAPE SURG 17X23 STRL (DRAPES) ×2 IMPLANT
DRAPE SURG ORHT 6 SPLT 77X108 (DRAPES) ×4 IMPLANT
DRAPE U-SHAPE 47X51 STRL (DRAPES) ×2 IMPLANT
DRILL 2.5MM SMALL QC EVOS (BIT) ×2
DRILL 2.7MM OVERDRILL QC (BIT) ×2
DRILL OVER 3.5 AO QC SHORT SM (DRILL) ×2
DRILL QC 2.0 SHORT EVOS SM (DRILL) ×2
DRILL QC 2.5MM SHORT EVOS SM (DRILL) ×2
DRSG MEPILEX BORDER 4X8 (GAUZE/BANDAGES/DRESSINGS) IMPLANT
DURAPREP 26ML APPLICATOR (WOUND CARE) ×3 IMPLANT
ELECT BLADE 4.0 EZ CLEAN MEGAD (MISCELLANEOUS) ×2
ELECT REM PT RETURN 9FT ADLT (ELECTROSURGICAL) ×2
ELECTRODE BLDE 4.0 EZ CLN MEGD (MISCELLANEOUS) ×1 IMPLANT
ELECTRODE REM PT RTRN 9FT ADLT (ELECTROSURGICAL) ×2 IMPLANT
GAUZE SPONGE 4X4 12PLY STRL (GAUZE/BANDAGES/DRESSINGS) ×1 IMPLANT
GAUZE XEROFORM 5X9 LF (GAUZE/BANDAGES/DRESSINGS) ×1 IMPLANT
GLOVE BIO SURGEON STRL SZ 6.5 (GLOVE) ×2 IMPLANT
GLOVE BIO SURGEON STRL SZ8 (GLOVE) ×1 IMPLANT
GLOVE BIOGEL PI IND STRL 7.0 (GLOVE) ×4 IMPLANT
GLOVE BIOGEL PI IND STRL 7.5 (GLOVE) ×2 IMPLANT
GLOVE BIOGEL PI IND STRL 8 (GLOVE) ×2 IMPLANT
GLOVE ECLIPSE 8.0 STRL XLNG CF (GLOVE) ×3 IMPLANT
GLOVE INDICATOR 6.5 STRL GRN (GLOVE) ×2 IMPLANT
GLOVE INDICATOR 8.0 STRL GRN (GLOVE) ×1 IMPLANT
GOWN STRL REUS W/ TWL LRG LVL3 (GOWN DISPOSABLE) ×6 IMPLANT
GOWN STRL REUS W/ TWL XL LVL3 (GOWN DISPOSABLE) ×3 IMPLANT
GOWN STRL REUS W/TWL LRG LVL3 (GOWN DISPOSABLE) ×6
GOWN STRL REUS W/TWL XL LVL3 (GOWN DISPOSABLE) ×4
K-WIRE .9X150 (WIRE) ×2
K-WIRE 1.6 (WIRE) ×2
K-WIRE FX150X1.6XTROC PNT (WIRE) ×2
KIT BASIN OR (CUSTOM PROCEDURE TRAY) ×2 IMPLANT
KIT TURNOVER KIT B (KITS) ×2 IMPLANT
KWIRE .9X150 (WIRE) ×1 IMPLANT
KWIRE FX150X1.6XTROC PNT (WIRE) ×1 IMPLANT
LOOP VESSEL MAXI BLUE 1X16 (MISCELLANEOUS) ×1 IMPLANT
MANIFOLD NEPTUNE II (INSTRUMENTS) ×2 IMPLANT
NDL 22X1.5 STRL (OR ONLY) (MISCELLANEOUS) IMPLANT
NDL SUT .5 MAYO 1.404X.05X (NEEDLE) ×1 IMPLANT
NDL SUT 2 .5 CRC MAYO 1.732X (NEEDLE) ×1 IMPLANT
NEEDLE 22X1.5 STRL (OR ONLY) (MISCELLANEOUS) IMPLANT
NEEDLE MAYO TAPER (NEEDLE)
NS IRRIG 1000ML POUR BTL (IV SOLUTION) ×2 IMPLANT
PACK SHOULDER (CUSTOM PROCEDURE TRAY) ×1 IMPLANT
PAD ARMBOARD 7.5X6 YLW CONV (MISCELLANEOUS) ×4 IMPLANT
PAD CAST CTTN 4X4 STRL (SOFTGOODS) ×2 IMPLANT
PADDING CAST COTTON 4X4 STRL (SOFTGOODS) ×4
PADDING CAST COTTON 6X4 STRL (CAST SUPPLIES) ×1 IMPLANT
PLATE DIS HUM EVOS 2.7X151 (Plate) ×1 IMPLANT
PLATE HUM EVOS 7H R 2.7X124 (Plate) ×1 IMPLANT
RESTRAINT HEAD UNIVERSAL NS (MISCELLANEOUS) ×1 IMPLANT
RETRIEVER SUT HEWSON (MISCELLANEOUS) ×1 IMPLANT
SCREW BONE LOCKING 2.7 X 48 (Screw) ×1 IMPLANT
SCREW CANN PT 6.5 X 120 46 (Screw) ×1 IMPLANT
SCREW CANN PT SD 8X120 (Screw) ×1 IMPLANT
SCREW CORT 2.7X20 T8 ST EVOS (Screw) ×1 IMPLANT
SCREW CORT 2.7X22 T8 ST EVOS (Screw) ×1 IMPLANT
SCREW CORT 2.7X42 STAR T8 EVOS (Screw) ×1 IMPLANT
SCREW CORT 3.5X22 ST EVOS (Screw) ×2 IMPLANT
SCREW CORT ST EVOS 2.7X14 (Screw) ×1 IMPLANT
SCREW CORT ST EVOS 3.5X46 (Screw) ×1 IMPLANT
SCREW CORTEX 3.5X24MM (Screw) ×5 IMPLANT
SCREW CTX 3.5X50MM EVOS (Screw) ×1 IMPLANT
SCREW LOCK 2.7X26 (Screw) ×1 IMPLANT
SCREW LOCK ST EVOS 2.7X24 (Screw) ×1 IMPLANT
SCREW LOCK ST EVOS 3.5 X 26 (Screw) ×1 IMPLANT
SCREW LOCK ST EVOS 3.5X24 (Screw) ×1 IMPLANT
SLING ARM FOAM STRAP XLG (SOFTGOODS) ×1 IMPLANT
SPONGE T-LAP 18X18 ~~LOC~~+RFID (SPONGE) ×4 IMPLANT
STAPLER VISISTAT 35W (STAPLE) ×1 IMPLANT
STRIP CLOSURE SKIN 1/2X4 (GAUZE/BANDAGES/DRESSINGS) ×1 IMPLANT
SUCTION FRAZIER HANDLE 10FR (MISCELLANEOUS) ×2
SUCTION TUBE FRAZIER 10FR DISP (MISCELLANEOUS) ×2 IMPLANT
SUT ETHILON 2 0 FS 18 (SUTURE) ×2 IMPLANT
SUT FIBERWIRE #2 38 T-5 BLUE (SUTURE) ×2
SUT VIC AB 0 CT1 27 (SUTURE) ×6
SUT VIC AB 0 CT1 27XBRD ANBCTR (SUTURE) ×3 IMPLANT
SUT VIC AB 3-0 SH 27 (SUTURE) ×6
SUT VIC AB 3-0 SH 27X BRD (SUTURE) ×3 IMPLANT
SUTURE FIBERWR #2 38 T-5 BLUE (SUTURE) ×1 IMPLANT
SYR CONTROL 10ML LL (SYRINGE) IMPLANT
TAP CANN 6.5 (Miscellaneous) ×1 IMPLANT
TAP CANN QC SN 8 (ORTHOPEDIC DISPOSABLE SUPPLIES) ×1 IMPLANT
TOWEL GREEN STERILE (TOWEL DISPOSABLE) ×2 IMPLANT
TOWEL GREEN STERILE FF (TOWEL DISPOSABLE) ×1 IMPLANT
TUBE CONNECTING 12X1/4 (SUCTIONS) ×3 IMPLANT
WASHER CANN 12.7 STRL (Washer) ×1 IMPLANT
WATER STERILE IRR 1000ML POUR (IV SOLUTION) ×2 IMPLANT
YANKAUER SUCT BULB TIP NO VENT (SUCTIONS) ×1 IMPLANT

## 2022-04-17 NOTE — Anesthesia Preprocedure Evaluation (Addendum)
Anesthesia Evaluation  Patient identified by MRN, date of birth, ID band Patient awake    Reviewed: Allergy & Precautions, H&P , NPO status , Patient's Chart, lab work & pertinent test results, reviewed documented beta blocker date and time   History of Anesthesia Complications Negative for: history of anesthetic complications  Airway Mallampati: III  TM Distance: >3 FB Neck ROM: Full    Dental no notable dental hx. (+) Edentulous Upper, Edentulous Lower   Pulmonary asthma    Pulmonary exam normal breath sounds clear to auscultation       Cardiovascular Exercise Tolerance: Good hypertension, Pt. on medications + CAD  + Valvular Problems/Murmurs AS  Rhythm:Regular Rate:Normal + Systolic murmurs  IMPRESSIONS     1. Normal LV function; calcified aortic valve with mild AS (mean gradient  13 mmHg and AVA 1.7 cm2).   2. Left ventricular ejection fraction, by estimation, is 60 to 65%. The  left ventricle has normal function. The left ventricle has no regional  wall motion abnormalities. There is mild concentric left ventricular  hypertrophy. Left ventricular diastolic  parameters are consistent with Grade II diastolic dysfunction  (pseudonormalization).   3. Right ventricular systolic function is normal. The right ventricular  size is normal.   4. Left atrial size was moderately dilated.   5. The mitral valve is normal in structure. No evidence of mitral valve  regurgitation. No evidence of mitral stenosis.   6. The aortic valve is tricuspid. Aortic valve regurgitation is not  visualized. Mild aortic valve stenosis.   7. The inferior vena cava is normal in size with greater than 50%  respiratory variability, suggesting right atrial pressure of 3 mmHg.   ECHO 4/21 1. Left ventricular ejection fraction, by estimation, is 55 to 60%. The  left ventricle has normal function. The left ventricle has no regional  wall motion  abnormalities. There is mild left ventricular hypertrophy.  Left ventricular diastolic parameters  were normal.  2. Right ventricular systolic function is normal. The right ventricular  size is normal. There is mildly elevated pulmonary artery systolic  pressure.  3. The mitral valve is grossly normal. Mild mitral valve regurgitation.  4. AV is thickened, calcified with very mildly restricted motion. Peak  and mean gradients thruogh the valve are 23 and 11 mm Hg respectively  consistent with mild AS.Marland Kitchen The aortic valve is tricuspid. Aortic valve  regurgitation is not visualized.     Neuro/Psych  PSYCHIATRIC DISORDERS Anxiety Depression    negative neurological ROS     GI/Hepatic Neg liver ROS, hiatal hernia,GERD  Medicated and Controlled,,EGD 05/03/18: IMPRESSION: - LA Grade A reflux esophagitis. - Gastric bypass with a medium-sized pouch and intact staple line. Gastrojejunal anastomosis characterized by erosion. Mild gastritis with erosions and adherent heme.    Endo/Other  negative endocrine ROS    Renal/GU negative Renal ROS  negative genitourinary   Musculoskeletal  (+) Arthritis , Osteoarthritis,    Abdominal   Peds  Hematology  (+) Blood dyscrasia, anemia   Anesthesia Other Findings   Reproductive/Obstetrics negative OB ROS                             Anesthesia Physical Anesthesia Plan  ASA: 3  Anesthesia Plan: General   Post-op Pain Management: Tylenol PO (pre-op)*, Toradol IV (intra-op)* and Regional block*   Induction: Intravenous  PONV Risk Score and Plan: 2 and Ondansetron, Treatment may vary due to age or medical  condition and Midazolam  Airway Management Planned: Oral ETT  Additional Equipment:   Intra-op Plan:   Post-operative Plan: Extubation in OR  Informed Consent: I have reviewed the patients History and Physical, chart, labs and discussed the procedure including the risks, benefits and alternatives for the  proposed anesthesia with the patient or authorized representative who has indicated his/her understanding and acceptance.     Dental advisory given  Plan Discussed with: Anesthesiologist and CRNA  Anesthesia Plan Comments: ( )        Anesthesia Quick Evaluation

## 2022-04-17 NOTE — Anesthesia Procedure Notes (Signed)
Anesthesia Regional Block: Supraclavicular block   Pre-Anesthetic Checklist: , timeout performed,  Correct Patient, Correct Site, Correct Laterality,  Correct Procedure, Correct Position, site marked,  Risks and benefits discussed,  Pre-op evaluation,  At surgeon's request and post-op pain management  Laterality: Right  Prep: Maximum Sterile Barrier Precautions used, chloraprep       Needles:  Injection technique: Single-shot  Needle Type: Echogenic Stimulator Needle     Needle Length: 9cm  Needle Gauge: 21     Additional Needles:   Procedures:,,,, ultrasound used (permanent image in chart),,    Narrative:  Start time: 04/17/2022 12:44 PM End time: 04/17/2022 12:54 PM Injection made incrementally with aspirations every 5 mL.  Performed by: Personally  Anesthesiologist: Roderic Palau, MD  Additional Notes:

## 2022-04-17 NOTE — Progress Notes (Signed)
Trauma notified of patients urine output for the shift. 1000 ns bolus given at 0200. Bladder scanned at 0600 and 0 ml on monitor.

## 2022-04-17 NOTE — Progress Notes (Signed)
Trauma/Critical Care Follow Up Note  Subjective:    Overnight Issues:   Objective:  Vital signs for last 24 hours: Temp:  [98.2 F (36.8 C)-98.7 F (37.1 C)] 98.3 F (36.8 C) (02/12 0400) Pulse Rate:  [71-91] 71 (02/12 0700) Resp:  [7-22] 12 (02/12 0700) BP: (71-147)/(47-126) 103/61 (02/12 0700) SpO2:  [86 %-100 %] 97 % (02/12 0700)  Hemodynamic parameters for last 24 hours:    Intake/Output from previous day: 02/11 0701 - 02/12 0700 In: 2330.3 [I.V.:340.2; IV Piggyback:1990.1] Out: 950 [Urine:950]  Intake/Output this shift: No intake/output data recorded.  Vent settings for last 24 hours:    Physical Exam:  Gen: comfortable, no distress Neuro: non-focal exam HEENT: PERRL Neck: supple CV: RRR Pulm: unlabored breathing Abd: soft, NT GU: clear yellow urine Extr: wwp, no edema   Results for orders placed or performed during the hospital encounter of 04/14/22 (from the past 24 hour(s))  CBC     Status: Abnormal   Collection Time: 04/17/22  5:40 AM  Result Value Ref Range   WBC 6.0 4.0 - 10.5 K/uL   RBC 3.31 (L) 4.22 - 5.81 MIL/uL   Hemoglobin 9.4 (L) 13.0 - 17.0 g/dL   HCT 29.6 (L) 39.0 - 52.0 %   MCV 89.4 80.0 - 100.0 fL   MCH 28.4 26.0 - 34.0 pg   MCHC 31.8 30.0 - 36.0 g/dL   RDW 13.7 11.5 - 15.5 %   Platelets 188 150 - 400 K/uL   nRBC 0.0 0.0 - 0.2 %  Basic metabolic panel     Status: Abnormal   Collection Time: 04/17/22  5:40 AM  Result Value Ref Range   Sodium 134 (L) 135 - 145 mmol/L   Potassium 4.3 3.5 - 5.1 mmol/L   Chloride 102 98 - 111 mmol/L   CO2 25 22 - 32 mmol/L   Glucose, Bld 99 70 - 99 mg/dL   BUN 22 (H) 6 - 20 mg/dL   Creatinine, Ser 1.03 0.61 - 1.24 mg/dL   Calcium 8.0 (L) 8.9 - 10.3 mg/dL   GFR, Estimated >60 >60 mL/min   Anion gap 7 5 - 15    Assessment & Plan: The plan of care was discussed with the bedside nurse for the day, who is in agreement with this plan and no additional concerns were raised.   Present on  Admission:  Fall    LOS: 2 days   Additional comments:I reviewed the patient's new clinical lab test results.   and I reviewed the patients new imaging test results.    FFH   Right rib fxs 2-12 - pain control, IS, pulm toilet Small R pneumothorax - repeat CXR stable  Right comminuted scapula fx - ortho c/s, decision about surgical management pending Possible L scapula fx - ortho c/s, Dr. Percell Miller, WBAT Compression fx of C7, T3-5 - NSGY c/s, continue collar Right humeral fx - ortho c/s, Dr. Griffin Basil, OR 2/12   Right olecranon fx - ortho c/s, NWB Right possible radial neck fx - ortho c/s, NWB Right lower back hematoma - monitor AKI - normalized with fluids, monitor Elevated transaminases - downtrending T12-L5 right TP fx - pain control Acute blood loss anemia - trend R ear pain - looked with otoscope today, nothing concerning, will c/s ENT non-emergently  Shock - off levo H/o gastric bypass at [place] - avoid nsaids  Chronic back pain on chronic narcotics - pain management by Dr. Suella Broad at [facility], escalate regimen  to max tylenol, increase gabapentin, lido patches, incr oxycontin FEN - regular diet DVT - SCDs, LMWH 40BID Dispo - 4NP, OR today    Jesusita Oka, MD Trauma & General Surgery Please use [email or web address] to contact on call provider  04/17/2022  *Care during the described time interval was provided by me. I have reviewed this patient's available data, including medical history, events of note, physical examination and test results as part of my evaluation.

## 2022-04-17 NOTE — Interval H&P Note (Signed)
All questions answered

## 2022-04-17 NOTE — Op Note (Signed)
Orthopaedic Surgery Operative Note (CSN: [id number])  Bradley [name]  20-Oct-1970 Date of Surgery: 04/17/2022   Diagnoses:  Right intra-articular distal humerus fracture with massive amounts of comminution, right humeral shaft fracture  Procedure: Right distal humerus open reduction internal fixation Right humeral shaft open reduction internal fixation Right ulnar nerve neurolysis with anterior transposition  Operative Finding Successful completion of the planned procedure.  This is an extraordinarily difficult case as the preoperative imaging that was ordered was suboptimal however it demonstrated that we would need to go to the operating room regardless.  The patient had 10-11 independent fragments encompassing both the distal joint as well as the humeral shaft distally.  We used a combination of many lag screws as well as implanted K wires to provide fixation for some of the smaller fragments that were unable to be held with screws.  These were intercalary articular fragments that were unable to be excised without causing malreduction of the joint or large defects that were unable to be bridged effectively.  This case took much longer than the standard based on the complexity.  He is at relatively high risk of failure secondary to the comminution.  There was lateral column comminution that was unable to be reconstructed and we used a completely stripped medial posterior fragment to obtain a reasonable cortical key.  There was a small shift as our initial fixation was placed however we felt that the 1 mm extra-articular malreduction was not appropriate to chase as it would theoretically have caused potential catastrophic loss of fixation of her intercalary fragments which has been lying in place.  Patient is at high risk for implant failure, nonunion, infection as well as stiffness and heterotopic bone formation secondary to the level comminution.  We will splint for a week and then work on  motion.  Of note: We were very careful to fully inspect the articular surface by hyperflexing the elbow after olecranon osteotomy was performed to verify that our K wires and screws did not perforate the articular surface.  We were able to visualize the entirety of the articular surface.  There is the appearance of one of the K wires perforating the articular surface however this is in the subchondral and chondral surface rather than outside the joint.  As these fragments were essentially stripped this was the only way to effectively fix them.  Post-operative plan: The patient will be nonweightbearing in a splint for a week transition out of splint around a week for range of motion while still maintaining a less than 1 pound weightbearing status.  The patient will be discharged home.  DVT prophylaxis per primary team, no orthopedic contraindications.   Pain control with PRN pain medication preferring oral medicines.  Follow up plan will be scheduled in approximately 7 days for incision check and XR.  Post-Op Diagnosis: Same Surgeons:Primary: Hiram Gash, MD Assistants:Caroline McBane PA-C Location: [facility] OR ROOM 11 Anesthesia: General with regional anesthesia Antibiotics: Ancef 2 g with local vancomycin powder 1 g at the surgical site Tourniquet time:  Total Tourniquet Time Documented: Upper Arm (Right) - 63 minutes Total: Upper Arm (Right) - 63 minutes  Estimated Blood Loss: [id number] cc Complications: None Specimens: None Implants: Implant Name Type Inv. Item Serial No. Manufacturer Lot No. LRB No. Used Action  TAP CANN 6.5 - [id number] Miscellaneous TAP CANN 6.5  [employer]  Right 1 Implanted  WASHER CANN 12.7 STRL - [id number] Washer WASHER CANN 12.7 STRL  [employer]  Right 1 Implanted  SCREW CTX 3.5X50MM EVOS - [id number] Screw SCREW CTX 3.5X50MM EVOS  [employer]  Right 1 Implanted  SCREW CORT ST EVOS 3.5X46 - [id number] Screw SCREW CORT ST  EVOS 3.5X46  [employer]  Right 1 Implanted  SCREW CORT ST EVOS 2.7X14 - [id number] Screw SCREW CORT ST EVOS 2.7X14  [employer]  Right 1 Implanted  SCREW CORT 2.7X20 T8 ST EVOS - [id number] Screw SCREW CORT 2.7X20 T8 ST EVOS  [employer]  Right 1 Implanted  SCREW CORT 3.5X22 ST EVOS - [id number] Screw SCREW CORT 3.5X22 ST EVOS  [employer]  Right 1 Implanted  SCREW CORT 2.7X22 T8 ST EVOS - [id number] Screw SCREW CORT 2.7X22 T8 ST EVOS  [employer]  Right 1 Implanted  PLATE DIS HUM EVOS 624THL - [id number] Plate PLATE DIS HUM EVOS 624THL  [employer]  Right 1 Implanted  SCREW CORTEX 3.5X24MM - [id number] Screw SCREW CORTEX 3.5X24MM  [employer]  Right 4 Implanted  SCREW LOCK ST EVOS 3.5 X 26 - [id number] Screw SCREW LOCK ST EVOS 3.5 X 26  [employer]  Right 1 Implanted  SCREW LOCK ST EVOS 2.7X24 - [id number] Screw SCREW LOCK ST EVOS 2.7X24  [employer]  Right 1 Implanted  SCREW LOCK 2.7X26 [id number] Screw SCREW LOCK 2.7X26  [employer]  Right 1 Implanted  SCREW LOCK ST EVOS 3.5X24 [id number] Screw SCREW LOCK ST EVOS 3.5X24  [employer]  Right 1 Implanted  PLATE HUM EVOS 7H R 075-GRM - [id number] Plate PLATE HUM EVOS 7H R 075-GRM  [employer]  Right 1 Implanted  SCREW CORT 2.7X42 STAR T8 EVOS - [id number] Screw SCREW CORT 2.7X42 STAR T8 EVOS  [employer]  Right 1 Implanted  SCREW BONE LOCKING 2.7 X 48 - [id number] Screw SCREW BONE LOCKING 2.7 X 53  [employer]  Right 1 Implanted  8.0x120 partially threaded screw    [employer]  Right 1 Implanted  8.0 tap    [employer]  Right 1 Implanted    Indications for Surgery:   Bradley [name] is a 52 y.o. male with complex right elbow and humeral shaft fracture in the setting of  multitrauma.  Benefits and risks of operative and nonoperative management were discussed prior to surgery with patient/guardian(s) and informed consent form was completed.  Specific risks including infection, need for additional surgery, nonunion, malunion, neurovascular injury, heterotopic bone, stiffness amongst others.   Procedure:   The patient was identified properly. Informed consent was obtained and the surgical site was marked. The patient was taken up to suite where general anesthesia was induced.  The patient was positioned lateral on a beanbag with careful positioning from both ourselves as well as anesthesia as the patient was in a c-collar and C-spine precautions were maintained throughout.  The right arm was prepped and draped in the usual sterile fashion.  Timeout was performed before the beginning of the case.  Tourniquet was used for the above duration.  We began with a posterior approach to the elbow making a 15 cm incision starting just distal to the olecranon proceeding proximally.  With the skin sharply raising full-thickness skin flaps achieving hemostasis as we progressed.  We raised medial lateral flaps and were able to identify the ulnar nerve.  We performed an ulnar neurolysis along its entirety of its course as there was a large fragment of bone that was tenting the nerve itself.  We resected the medial antebrachial septum and expose the medial column.  There is significant shaft comminution as described above with 5-6 fragments.  We went to the lateral side and performed a pair tricipital approach exposing the radial bundle and protecting this after identifying it.  Was protected with blunt retractors throughout the case.  At this point we used a small radial based window to view the joint and then did confirm that there was a high level comminution that was not appropriate to fix without an olecranon osteotomy.  We used the [employer] 6.5/8.0 cannulated screw set and  placed a guidewire down the length of the ulnar canal.  Once it was placed we measured and tapped for 125 mm screw.  We ended up selecting an 8 mm screw as a 6.5 screw did not appropriately provide purchase.  Once this was complete we held on placing our screw till the end of the case.  We made a chevron osteotomy in the articular surface using fluoroscopic guidance.  Once this was complete we were able able to elevate the olecranon fragment and invert the triceps.  It was kept off the skin and clipped to the drapes to help with exposure.  We then fully performed a neurolysis of the ulnar nerve past the FCU extending into the deep fascia and the arcade of Struthers.  We released the performing neurolysis of the radial bundle as well.  There is a small peripheral branch that was less than a millimeter in size of an artery that was clamped and carefully coagulated.  At this point we did expose her entire fracture.  There is an extraordinary amount of comminution both extra-articular and humeral shaft as well as intra-articular.  After careful examination of the intra-articular fragments there were 2 small intercalary fragments that were too small to accommodate screw fixation however were the key to our entire articular reduction.  We small 0.45 K wires that were cut and placed verifying we were out of the articular surface and we are able to tamp these down so that they were not proud as well as not able to be exposed from outside of the joint.    These will be retained and cannot be removed unless the fragments completely fall apart.  Once these intercalary fragments were appropriately fixed we attempted to perform a reduction of the large split in the articular surface.  It was clear that there is an angular deformity.  The medial column of the olecranon fossa it was noted had impaled the distal trochlear cancellous bone but had not perforated the articular surface.  We were able to carefully try these  fragments apart as there was a fracture line however they had not impacted together making it difficult to identify and almost impossible to identify on the CT.  Once we had this marginal impaction type injury address the anatomic reduction was much more able to be visualized.  We were able to provisionally hold with K wires in the articular spool and placed to position 3.5 percolated stable still screws which held the articular reduction anatomically.  We then started with her extra-articular reduction.  As there were many fragments we initially considered bridging the area however the cortical fragments which showed badly displaced that they interfered with her overall reduction and we felt that attempting to provide  a more anatomic reduction and aiming for primary bone healing would be appropriate.  We ended up using 10-11 lag screws both 3.5 and 2.7 to simplify the fracture sequentially.  There was a completely stripped posterior medial fragment that we unfortunately needed to use as there is both medial and lateral combination and were not able to get an anatomic key of the shaft to the distal segment.  Once we had this placed it was clear that the lateral comminution was not able to be reduced as it was far too fragmented to be appropriately fixed.  As there was significant comminution we felt that a standard posterior lateral plate would not provide the appropriate strength for this patient additionally as the patient is almost 120 kg.  We thusly selected a extra-articular distal humerus plate which we were able to carefully place using provisional clamps to hold our reduction as well as K wires.  Once it was placed we checked his position on fluoroscopic imaging and ensured that the position was appropriate.    The plate had to be laid on top with multiple lag screws which made it sit somewhat proud.  Though the reduction was perfect initially as we placed a lateral plate with initial nonlocking screws  there was a small amount of shifting as the screws obtain purchase and caused a 1 to 1-1/2 mm shift at the extra-articular portion of the distal fragment to the shaft.  We felt that trying to address this further would only help x-ray appearance and not provide any benefit to the patient and thus decided to avoid damage to the construct and continue on excepting a slight shift in alignment.  We obtained multiple nonlocking and locking screws proximal distal to the fracture site specifically obtaining 3 screws with bicortical purchase 2 locking and 1 nonlocking proximal to the fracture site.  The plate themselves come in 4 hole increased length and we did not feel that obtaining a longer plate would have been in the patient's best interest and he is already an extremely large exposure.  To try and combat the relatively free medial side of the intra-articular space we felt that a medial plate was appropriate.  We transposed the nerve into the anterior portion of the subcutaneous tissue and then sure that there is no tension on it.  We retained it in place using multiple deep sutures in the fatty tissue to the fascia.  Nerve was under no tension was protected throughout the remainder the case.  We placed a direct medial Smith [employer] Nephew plate and obtained 3 screws proximal to the fracture site as well as to carefully position locking screws distal using multiaxis locking technique.  We able to ensure that we did avoided our distal screw fixation while positioning the screws is close to the subchondral surfaces possible without perforating.  Our construct was robust and we confirmed that none of the screws perforated the articular surface.  Once we had done this we irrigated and replaced our olecranon fragment from our chevron osteotomy.  Able to hold in place with a clamp.  A bone tunnel was made with a 2 mm drill bit and a FiberWire was passed just distal to the osteotomy site to perform a tension band type  construct.  We used a guidewire and placed our 8 mm x 125 mm Smith [employer] Nephew cannulated screw and obtain good purchase.  Checked the position on fluoroscopy.  That point we placed a tension band figure-of-eight construct and were  able to note good security.  Final fluoroscopic images demonstrated near anatomic reduction of the entire construct in good position.  We irrigated again and placed local vancomycin powder.  We closed the dead space medial lateral to the triceps careful to avoid damage to the neurovascular structures.  We closed the incision in a multilayer fashion finishing with running nylon suture.  Sterile dressing and splint were placed.  Patient awoken and taken to PACU in stable condition.  Noemi Chapel, PA-C, present and scrubbed throughout the case, critical for completion in a timely fashion, and for retraction, instrumentation, closure.

## 2022-04-17 NOTE — H&P (View-Only) (Signed)
Patient's history is well-documented in his consult note.  I was asked to see the patient to determine the best course of action.  He has an intra-articular comminuted right distal humerus fracture.  Based on his age and function we feel that it would be beneficial to provide surgery to allow early mobilization and reduce risk of stiffness while maintaining the articular surface.  I discussed this with the patient and his wife this morning.  Will plan to proceed with surgery today.  His distal radial/median/ulnar nerve function is working well currently but my physical exam was limited secondary to splint placement.  The family's understanding of the risk including but not limited to infection, hardware pain, stiffness, loss of reduction and nerve and vessel injury.  Would proceed today.  Patient unfortunately received a dose of Lovenox this morning however with the tourniquet used on the surgery I do not think this was the an issue though he may have a slightly higher risk of hematoma formation.

## 2022-04-17 NOTE — Anesthesia Procedure Notes (Signed)
Procedure Name: Intubation Date/Time: 04/17/2022 1:23 PM  Performed by: Colin Benton, CRNAPre-anesthesia Checklist: Patient identified, Emergency Drugs available, Suction available and Patient being monitored Patient Re-evaluated:Patient Re-evaluated prior to induction Oxygen Delivery Method: Circle system utilized Preoxygenation: Pre-oxygenation with 100% oxygen Induction Type: IV induction Ventilation: Mask ventilation without difficulty and Oral airway inserted - appropriate to patient size Laryngoscope Size: Glidescope and 4 Grade View: Grade I Tube type: Oral Tube size: 7.5 mm Number of attempts: 1 Airway Equipment and Method: Oral airway, Rigid stylet and Video-laryngoscopy Placement Confirmation: ETT inserted through vocal cords under direct vision, positive ETCO2 and breath sounds checked- equal and bilateral Secured at: 23 cm Tube secured with: Tape Dental Injury: Teeth and Oropharynx as per pre-operative assessment  Difficulty Due To: Difficulty was anticipated and Difficult Airway- due to cervical collar Comments: Elective glidescope intubation due to C-Collar

## 2022-04-17 NOTE — Progress Notes (Signed)
Trauma notified of patients recent low blood pressure. 1000 NS bolus ordered.

## 2022-04-17 NOTE — Consult Note (Signed)
Patient's history is well-documented in his consult note.  I was asked to see the patient to determine the best course of action.  He has an intra-articular comminuted right distal humerus fracture.  Based on his age and function we feel that it would be beneficial to provide surgery to allow early mobilization and reduce risk of stiffness while maintaining the articular surface.  I discussed this with the patient and his wife this morning.  Will plan to proceed with surgery today.  His distal radial/median/ulnar nerve function is working well currently but my physical exam was limited secondary to splint placement.  The family's understanding of the risk including but not limited to infection, hardware pain, stiffness, loss of reduction and nerve and vessel injury.  Would proceed today.  Patient unfortunately received a dose of Lovenox this morning however with the tourniquet used on the surgery I do not think this was the an issue though he may have a slightly higher risk of hematoma formation.

## 2022-04-17 NOTE — Progress Notes (Signed)
Dr. Griffin Basil called, made aware pt received Lovenox SQ today prior to surgery, Ok to proceed for surgery today.

## 2022-04-17 NOTE — Transfer of Care (Signed)
Immediate Anesthesia Transfer of Care Note  Patient: Bradley [name]  Procedure(s) Performed: OPEN REDUCTION INTERNAL FIXATION (ORIF) RIGHT DISTAL HUMERUS FRACTURE (Right: Arm Upper)  Patient Location: PACU  Anesthesia Type:GA combined with regional for post-op pain  Level of Consciousness: drowsy and patient cooperative  Airway & Oxygen Therapy: Patient Spontanous Breathing and Patient connected to nasal cannula oxygen  Post-op Assessment: Report given to RN and Post -op Vital signs reviewed and stable  Post vital signs: Reviewed and stable  Last Vitals:  Vitals Value Taken Time  BP 98/45 04/17/22 1708  Temp    Pulse 82 04/17/22 1711  Resp 15 04/17/22 1711  SpO2 91 % 04/17/22 1711  Vitals shown include unvalidated device data.  Last Pain:  Vitals:   04/17/22 1126  TempSrc:   PainSc: 7       Patients Stated Pain Goal: 0 ([id number])  Complications:  Encounter Notable Events  Notable Event Outcome Phase Comment  Difficult to intubate - expected  Intraprocedure Filed from anesthesia note documentation.

## 2022-04-18 LAB — BASIC METABOLIC PANEL
Anion gap: 6 (ref 5–15)
BUN: 18 mg/dL (ref 6–20)
CO2: 27 mmol/L (ref 22–32)
Calcium: 8 mg/dL — ABNORMAL LOW (ref 8.9–10.3)
Chloride: 102 mmol/L (ref 98–111)
Creatinine, Ser: 0.88 mg/dL (ref 0.61–1.24)
GFR, Estimated: >60 mL/min (ref >60)
Glucose, Bld: 129 mg/dL — ABNORMAL HIGH (ref 70–99)
Potassium: 5 mmol/L (ref 3.5–5.1)
Sodium: 135 mmol/L (ref 135–145)

## 2022-04-18 LAB — CBC
HCT: 27.4 % — ABNORMAL LOW (ref 39.0–52.0)
Hemoglobin: 8.7 g/dL — ABNORMAL LOW (ref 13.0–17.0)
MCH: 28.5 pg (ref 26.0–34.0)
MCHC: 31.8 g/dL (ref 30.0–36.0)
MCV: 89.8 fL (ref 80.0–100.0)
Platelets: 205 10*3/uL (ref 150–400)
RBC: 3.05 MIL/uL — ABNORMAL LOW (ref 4.22–5.81)
RDW: 13.3 % (ref 11.5–15.5)
WBC: 6.2 10*3/uL (ref 4.0–10.5)
nRBC: 0 % (ref 0.0–0.2)

## 2022-04-18 MED ORDER — MAGNESIUM HYDROXIDE 400 MG/5ML PO SUSP
30.0000 mL | Freq: Once | ORAL | Status: AC
  Administered 2022-04-18: 30 mL via ORAL
  Filled 2022-04-18: qty 30

## 2022-04-18 MED ORDER — POLYETHYLENE GLYCOL 3350 17 G PO PACK
17.0000 g | PACK | Freq: Two times a day (BID) | ORAL | Status: DC
  Administered 2022-04-18 – 2022-04-20 (×5): 17 g via ORAL
  Filled 2022-04-18 (×6): qty 1

## 2022-04-18 MED ORDER — HYDROMORPHONE HCL 1 MG/ML IJ SOLN
0.2500 mg | Freq: Four times a day (QID) | INTRAMUSCULAR | Status: DC | PRN
  Administered 2022-04-18 – 2022-04-20 (×6): 0.5 mg via INTRAVENOUS
  Filled 2022-04-18 (×6): qty 0.5

## 2022-04-18 MED ORDER — TAMSULOSIN HCL 0.4 MG PO CAPS
0.4000 mg | ORAL_CAPSULE | Freq: Every day | ORAL | Status: DC
  Administered 2022-04-18 – 2022-04-20 (×3): 0.4 mg via ORAL
  Filled 2022-04-18 (×3): qty 1

## 2022-04-18 MED ORDER — SENNA 8.6 MG PO TABS
2.0000 | ORAL_TABLET | Freq: Once | ORAL | Status: AC
  Administered 2022-04-18: 17.2 mg via ORAL
  Filled 2022-04-18: qty 2

## 2022-04-18 MED ORDER — BISACODYL 10 MG RE SUPP
10.0000 mg | Freq: Once | RECTAL | Status: AC
  Administered 2022-04-18: 10 mg via RECTAL
  Filled 2022-04-18: qty 1

## 2022-04-18 MED ORDER — MAGNESIUM CITRATE PO SOLN
1.0000 | Freq: Once | ORAL | Status: AC
  Administered 2022-04-18: 1 via ORAL
  Filled 2022-04-18: qty 296

## 2022-04-18 MED ORDER — SODIUM ZIRCONIUM CYCLOSILICATE 5 G PO PACK
5.0000 g | PACK | Freq: Once | ORAL | Status: AC
  Administered 2022-04-18: 5 g via ORAL
  Filled 2022-04-18: qty 1

## 2022-04-18 MED ORDER — MORPHINE SULFATE (PF) 2 MG/ML IV SOLN
1.0000 mg | Freq: Four times a day (QID) | INTRAVENOUS | Status: DC | PRN
  Administered 2022-04-18: 2 mg via INTRAVENOUS
  Filled 2022-04-18: qty 1

## 2022-04-18 MED ORDER — BISACODYL 5 MG PO TBEC
10.0000 mg | DELAYED_RELEASE_TABLET | Freq: Once | ORAL | Status: AC
  Administered 2022-04-18: 10 mg via ORAL
  Filled 2022-04-18: qty 2

## 2022-04-18 NOTE — TOC CAGE-AID Note (Signed)
Transition of Care [facility]) - CAGE-AID Screening   Patient Details  Name: Bradley [name] MRN: [id number] Date of Birth: [birth year]  Transition of Care [facility]) CM/SW Contact:    Jinger Neighbors, LCSW Phone Number: 04/18/2022, 1:05 PM   Clinical Narrative:  CSW met with patient to complete assessment. Pt currently denies any concerns with alcohol use and illegal substances.   CAGE-AID Screening:    Have You Ever Felt You Ought to Cut Down on Your Drinking or Drug Use?: No Have People Annoyed You By Critizing Your Drinking Or Drug Use?: No Have You Felt Bad Or Guilty About Your Drinking Or Drug Use?: No Have You Ever Had a Drink or Used Drugs First Thing In The Morning to Steady Your Nerves or to Get Rid of a Hangover?: No CAGE-AID Score: 0  Substance Abuse Education Offered: No

## 2022-04-18 NOTE — Progress Notes (Signed)
Providing Compassionate, Quality Care - Together   Subjective: Patient reports stable arm and back pain. No new issues. He and his wife had some questions regarding moving forward with the surgery Dr. Annette Stable described at his last office visit for the arthritic changes at L5-S1. Initially, the patient was going to try injections, but was wondering if it would be better to move forward with surgery given his new injuries.  Objective: Vital signs in last 24 hours: Temp:  [98 F (36.7 C)-98.8 F (37.1 C)] 98 F (36.7 C) (02/13 0817) Pulse Rate:  [64-85] 85 (02/13 0817) Resp:  [9-28] 14 (02/13 0817) BP: (98-135)/(39-85) 121/63 (02/13 0817) SpO2:  [93 %-100 %] 99 % (02/13 0817)  Intake/Output from previous day: 02/12 0701 - 02/13 0700 In: 2016.6 [I.V.:1016.2; IV Piggyback:1000.5] Out: 2820 [Urine:2520; Blood:300] Intake/Output this shift: No intake/output data recorded.  Alert and oriented x 4 PERRLA CN II-XII grossly intact Right arm immobilized in sling and wrapped with ACE bandage MAE aside from immobilized RUE, Strength and sensation intact   Lab Results: Recent Labs    04/17/22 0540 04/18/22 0111  WBC 6.0 6.2  HGB 9.4* 8.7*  HCT 29.6* 27.4*  PLT 188 205   BMET Recent Labs    04/17/22 0540 04/18/22 0111  NA 134* 135  K 4.3 5.0  CL 102 102  CO2 25 27  GLUCOSE 99 129*  BUN 22* 18  CREATININE 1.03 0.88  CALCIUM 8.0* 8.0*    Studies/Results: DG Elbow 2 Views Right  Result Date: 04/17/2022 CLINICAL DATA:  Postop check EXAM: RIGHT ELBOW - 2 VIEW COMPARISON:  04/17/2022, 04/14/2022 FINDINGS: Postsurgical changes of distal humerus plate fixation and screw fixation of the proximal ulna. Improved fracture alignment. Small joint effusion. Splint in place. Expected soft tissue swelling. IMPRESSION: Postoperative changes of distal humerus fracture ORIF and screw fixation of the proximal ulna. Improved fracture alignment. No evidence of immediate hardware complication.  Electronically Signed   By: Maurine Simmering M.D.   On: 04/17/2022 20:08   DG Elbow 2 Views Right  Result Date: 04/17/2022 CLINICAL DATA:  ORIF EXAM: RIGHT ELBOW - 2 VIEW COMPARISON:  04/14/2022 FINDINGS: Serial C-arm images show plate and screw fixation of the comminuted distal humeral fracture and subsequent placement of an olecranon screw. IMPRESSION: Good appearance following ORIF of the distal humeral fracture. Olecranon screw also placed. Electronically Signed   By: Nelson Chimes M.D.   On: 04/17/2022 16:40   DG C-Arm 1-60 Min-No Report  Result Date: 04/17/2022 Fluoroscopy was utilized by the requesting physician.  No radiographic interpretation.   DG C-Arm 1-60 Min-No Report  Result Date: 04/17/2022 Fluoroscopy was utilized by the requesting physician.  No radiographic interpretation.   DG C-Arm 1-60 Min-No Report  Result Date: 04/17/2022 Fluoroscopy was utilized by the requesting physician.  No radiographic interpretation.    Assessment/Plan: Patient sustained right TP fractures from L1-L5 and T12 following a fall through his daughter's ceiling. He also has a C7 spinous process fracture. MRI C-spine demonstrated mild compression fractures of C7, T3, T4, and T5.   LOS: 3 days   -I discussed the situation with the patient and his wife. I explained that he would need to heal from his orthopedic surgery prior to moving forward with any procedures for his low back. I also recommended he discuss lumbar injections with ortho as Dr. Griffin Basil might want him to hold off on steroids for now. He and his wife voiced understanding. -The patient will need to follow  up with Dr. Annette Stable as an outpatient for monitoring of his cervical, thoracic, and lumbar spine fractures. He should continue to wear the hard cervical collar. TLSO brace only needed if it helps with the patient's pain.   Viona Gilmore, DNP, AGNP-C Nurse Practitioner  [facility] [place] [address],  [place],  [address] P: [phone]    F: [phone]  04/18/2022, 9:32 AM

## 2022-04-18 NOTE — Progress Notes (Signed)
Occupational Therapy Re-Evaluation Patient Details Name: Bradley [name] MRN: [id number] DOB: 01/10/1971 Today's Date: 04/18/2022   History of present illness 52 y.o. male who presents to [facility] hospital on 04/14/2022 after he fell through a roof. Imaging demonstrates multiple injuries:  R rib fxs 2-12, R pneumothorax, R scapula fx and possible L, C7 spinous process fx, R humeral fx, R olecranon fx, possible R radial neck fx, T12-L5 R TP fxs. Pt underwent R humerus ORIF and ulnar nerve neurolysis with anterior transposition on 2/12. PMHx: anxiety, arthritis, CAD, GERD, heart murmur, HTN, gastric bypass, R TKA   OT comments  Pt currently with functional limitations due to the deficits listed below (see OT Problem List). Re-evaluation completed this session due to pt under going RUE ORIF on 04/17/22. Prior to admit, pt was independent with all ADL tasks and functional mobility. Currently, pt is experiencing increased pain and orthostatic BP which is preventing him from complete BADL tasks and functional transfers without increased physical assist of 2 people. Discharge recommendation is CIR at this time prior to returning home with family support. Pt will benefit from skilled OT to increase their safety and independence with ADL and functional mobility for ADL to facilitate discharge to venue listed below. Acute OT will continue to follow patient.      Recommendations for follow up therapy are one component of a multi-disciplinary discharge planning process, led by the attending physician.  Recommendations may be updated based on patient status, additional functional criteria and insurance authorization.    Follow Up Recommendations  Acute inpatient rehab (3hours/day)     Assistance Recommended at Discharge Frequent or constant Supervision/Assistance  Patient can return home with the following  A lot of help with walking and/or transfers;Two people to help with bathing/dressing/bathroom;Assistance with  cooking/housework;Assistance with feeding;Help with stairs or ramp for entrance;Assist for transportation   Equipment Recommendations  Other (comment) (TBD)       Precautions / Restrictions Precautions Precautions: Fall;Cervical;Back Precaution Booklet Issued: No Precaution Comments: verbally reviewed back and cervical precautions. TLSO not used during session. Required Braces or Orthoses: Cervical Brace;Spinal Brace;Sling;Splint/Cast Cervical Brace: Hard collar;At all times Spinal Brace: Thoracolumbosacral orthotic (for comfort only) Splint/Cast: R long arm Restrictions Weight Bearing Restrictions: Yes RUE Weight Bearing: Non weight bearing       Mobility Bed Mobility Overal bed mobility: Needs Assistance Bed Mobility: Rolling, Sidelying to Sit Rolling: +2 for physical assistance, Total assist Sidelying to sit: Max assist, +2 for physical assistance, HOB elevated       General bed mobility comments: increased time, use of pad to assist in rolling Patient Response: Flat affect, Cooperative  Transfers Overall transfer level: Needs assistance Equipment used: 2 person hand held assist Transfers: Sit to/from Stand, Bed to chair/wheelchair/BSC Sit to Stand: Min assist, +2 safety/equipment, +2 physical assistance, From elevated surface Stand pivot transfers: Mod assist, +2 physical assistance         General transfer comment: pt initially completing step pivot although due to reduced resposiveness in standing when pivoting to recliner; one instance of knee buckling; PT/OT assist in pivoting hips to recliner to prevent potential fall     Balance Overall balance assessment: Needs assistance Sitting-balance support: No upper extremity supported, Feet supported Sitting balance-Leahy Scale: Fair Sitting balance - Comments: sitting EOB   Standing balance support: Single extremity supported, Reliant on assistive device for balance Standing balance-Leahy Scale: Poor Standing  balance comment: min A static standing, orthostatic during transfer to recliner, pre-syncopal episode during transfer  ADL either performed or assessed with clinical judgement   ADL Overall ADL's : Needs assistance/impaired Eating/Feeding: Minimal assistance;Bed level   Grooming: Moderate assistance;Bed level   Upper Body Bathing: Maximal assistance;Bed level   Lower Body Bathing: Total assistance;Bed level   Upper Body Dressing : Total assistance;Bed level   Lower Body Dressing: Total assistance;Bed level;+2 for physical assistance   Toilet Transfer: Minimal assistance;+2 for safety/equipment;+2 for physical assistance;Stand-pivot Toilet Transfer Details (indicate cue type and reason): simulated to recliner Toileting- Clothing Manipulation and Hygiene: Total assistance;+2 for physical assistance;Bed level      Extremity/Trunk Assessment Upper Extremity Assessment Upper Extremity Assessment: RUE deficits/detail RUE Deficits / Details: RUE ORIF 2/12. NWB LUE Deficits / Details: Able to utilize LUE for bed mobility and to assist with sit to stand while holding PT's hand.   Lower Extremity Assessment Lower Extremity Assessment: Defer to PT evaluation   Cervical / Trunk Assessment Cervical / Trunk Assessment: Other exceptions Cervical / Trunk Exceptions: cervial, thoracic, lumbar and rib fractures     Cognition Arousal/Alertness: Awake/alert Behavior During Therapy: WFL for tasks assessed/performed, Flat affect Overall Cognitive Status: Within Functional Limits for tasks assessed             Shoulder Instructions Shoulder Instructions Correct positioning of sling/immobilizer: Maximal assistance     General Comments Pt on 3L [place] at beginning of session. BP: 125/59 supine in bed. During transfer, pt presented with pre-syncopal symptoms. BP taken once pt in recliner supine: 99/56. After approximately 1 minute pt became verbally responsive, eyes open throughout. BP taken  supine: 126/60 and remained stable with recliner was brought up into an upright position.    Pertinent Vitals/ Pain       Pain Assessment Pain Assessment: 0-10 Pain Score: 10-Worst pain ever Pain Location: RUE Pain Descriptors / Indicators: Sharp, Grimacing, Operative site guarding Pain Intervention(s): Patient requesting pain meds-RN notified, Ice applied, Repositioned, Monitored during session         Frequency  Min 2X/week        Progress Toward Goals  OT Goals(current goals can now be found in the care plan section)  Progress towards OT goals: Not progressing toward goals - comment (orthostatic BP limited pt's ability to progress during session)  Acute Rehab OT Goals Patient Stated Goal: less pain  Plan Discharge plan needs to be updated;Frequency needs to be updated    Co-evaluation    PT/OT/SLP Co-Evaluation/Treatment: Yes Reason for Co-Treatment: Complexity of the patient's impairments (multi-system involvement);Necessary to address cognition/behavior during functional activity;For patient/therapist safety;To address functional/ADL transfers PT goals addressed during session: Mobility/safety with mobility;Balance;Strengthening/ROM OT goals addressed during session: ADL's and self-care;Strengthening/ROM      AM-PAC OT "6 Clicks" Daily Activity     Outcome Measure   Help from another person eating meals?: A Little Help from another person taking care of personal grooming?: A Lot Help from another person toileting, which includes using toliet, bedpan, or urinal?: Total Help from another person bathing (including washing, rinsing, drying)?: Total Help from another person to put on and taking off regular upper body clothing?: Total Help from another person to put on and taking off regular lower body clothing?: Total 6 Click Score: 9    End of Session Equipment Utilized During Treatment: Cervical collar;Other (comment) (UE sling)  OT Visit Diagnosis: Unsteadiness  on feet (R26.81);Other abnormalities of gait and mobility (R26.89);Muscle weakness (generalized) (M62.81);Pain Pain - Right/Left: Right Pain - part of body: Arm;Knee   Activity Tolerance Treatment limited secondary to  medical complications (Comment) (pre-syncopal episode, orthostatic BP)   Patient Left in chair;with call bell/phone within reach;with chair alarm set;with family/visitor present   Nurse Communication Mobility status;Patient requests pain meds;Other (comment) (Pt's vitals and pre-syncopal episode)        Time: [id number] OT Time Calculation (min): 41 min  Charges: OT General Charges $OT Visit: 1 Visit OT Evaluation $OT Re-eval: 1 Re-eval OT Treatments $Therapeutic Activity: 8-22 mins  Ailene Ravel, OTR/L,CBIS  Supplemental OT - [facility] and [facility] Secure Chat Preferred    Erik Burkett, Clarene Duke 04/18/2022, 2:13 PM

## 2022-04-18 NOTE — Progress Notes (Signed)
   ORTHOPAEDIC PROGRESS NOTE  s/p Procedure(s): OPEN REDUCTION INTERNAL FIXATION (ORIF) RIGHT DISTAL HUMERUS FRACTURE  SUBJECTIVE: Reports mild pain about operative site. Nerve block starting to wear off. Eating breakfast. Wife at bedside. No other complaints.  OBJECTIVE: PE: General: sitting up in hospital bed, NAD RUE: Splint CDI. Skin intact though cannot assess fully beneath splint. Nontender to palpation proximally. Distal motor and sensory altered in setting of block. Well perfused digits.     Vitals:   04/17/22 2313 04/18/22 0544  BP: 115/67 118/65  Pulse: 80 83  Resp: 13 17  Temp: 98.6 F (37 C) 98.5 F (36.9 C)  SpO2: 99% 100%   Stable post-op images  ASSESSMENT: Bradley [name] is a 52 y.o. malePOD#1  PLAN: Weightbearing: NWB RUE Insicional and dressing care: Dressings left intact until follow-up Orthopedic device(s): Splint Showering: Hold for now VTE prophylaxis: Resume Lovenox per primary team Pain control: Per trauma Follow - up plan: 1 week in office for incision check and xray Dispo: TBD. PT recommending CIR. Dispo per primary team.   Contact information:  Weekdays 8-5 Dr. Ophelia Charter, Noemi Chapel PA-C, After hours and holidays please check [email or web address] for group call information for [facility]  Noemi Chapel, PA-C 04/18/2022

## 2022-04-18 NOTE — Progress Notes (Signed)
Inpatient Rehab Admissions Coordinator Note:   Per therapy recommendations patient was screened for CIR candidacy by Michel Santee, PT. At this time, pt appears to be a potential candidate for CIR. I will place an order for rehab consult for full assessment, per our protocol.  Please contact me any with questions.Shann Medal, PT, DPT [phone] 04/18/22 3:16 PM

## 2022-04-18 NOTE — TOC Initial Note (Signed)
Transition of Care [facility]) - Initial/Assessment Note    Patient Details  Name: Bradley [name] MRN: [id number] Date of Birth: 1970/05/19  [facility] CM/SW Contact:    Ella Bodo, RN Phone Number: 04/18/2022, 4:38 PM  Clinical Narrative:                 Bradley [name] is a 52 y.o. male who fell through a roof. Imaging demonstrates multiple injuries: R rib fxs 2-12, R pneumothorax, R scapula fx and possible L, C7 spinous process fx, R humeral fx, R olecranon fx, possible R radial neck fx, T12-L5 R TP fxs. Prior to admission, patient independent living at home with spouse, who can provide 24-hour assistance at discharge.  PT/OT recommending CIR, and rehab consult has been requested.  Will follow for updates.  Expected Discharge Plan: IP Rehab Facility Barriers to Discharge: Continued Medical Work up   Patient Goals and CMS Choice   CMS Medicare.gov Compare Post Acute Care list provided to:: Patient Choice offered to / list presented to : Patient      Expected Discharge Plan and Services   Discharge Planning Services: CM Consult Post Acute Care Choice: IP Rehab Living arrangements for the past 2 months: Single Family Home                                      Prior Living Arrangements/Services Living arrangements for the past 2 months: Single Family Home Lives with:: Spouse Patient language and need for interpreter reviewed:: Yes Do you feel safe going back to the place where you live?: Yes      Need for Family Participation in Patient Care: Yes (Comment) Care giver support system in place?: Yes (comment)   Criminal Activity/Legal Involvement Pertinent to Current Situation/Hospitalization: No - Comment as needed  Activities of Daily Living Home Assistive Devices/Equipment: None ADL Screening (condition at time of admission) Patient's cognitive ability adequate to safely complete daily activities?: Yes Is the patient deaf or have difficulty  hearing?: No Does the patient have difficulty seeing, even when wearing glasses/contacts?: No Does the patient have difficulty concentrating, remembering, or making decisions?: No Patient able to express need for assistance with ADLs?: Yes Does the patient have difficulty dressing or bathing?: Yes Independently performs ADLs?: No Communication: Independent Dressing (OT): Needs assistance Is this a change from baseline?: Change from baseline, expected to last >3 days Grooming: Needs assistance Is this a change from baseline?: Change from baseline, expected to last >3 days Feeding: Needs assistance Is this a change from baseline?: Change from baseline, expected to last >3 days Bathing: Needs assistance Is this a change from baseline?: Change from baseline, expected to last >3 days Toileting: Needs assistance Is this a change from baseline?: Change from baseline, expected to last >3days In/Out Bed: Needs assistance Is this a change from baseline?: Change from baseline, expected to last >3 days Walks in Home: Needs assistance Is this a change from baseline?: Change from baseline, expected to last >3 days Does the patient have difficulty walking or climbing stairs?: Yes Weakness of Legs: Both Weakness of Arms/Hands: Both  Permission Sought/Granted                  Emotional Assessment Appearance:: Appears stated age Attitude/Demeanor/Rapport: Engaged Affect (typically observed): Accepting Orientation: : Oriented to Self, Oriented to Place, Oriented to  Time, Oriented to Situation  Admission diagnosis:  Fall [W19.XXXA] Critical polytrauma [T07.XXXA] Patient Active Problem List   Diagnosis Date Noted   Fall 04/15/2022   Candida infection, oral 03/29/2022   Pain in joint of left knee 03/14/2022   Gastric bypass status for obesity 12/18/2021   Multiple gallstones 12/18/2021   Class 2 severe obesity due to excess calories with serious comorbidity and body mass index (BMI)  of 35.0 to 35.9 in adult [facility]) 12/16/2021   Erectile dysfunction due to arterial insufficiency 12/14/2021   Epigastric abdominal pain 12/14/2021   Encounter for general adult medical examination with abnormal findings 12/12/2021   Generalized excess and redundant skin after bariatric surgery 06/16/2021   Class 1 obesity due to excess calories with serious comorbidity and body mass index (BMI) of 32.0 to 32.9 in adult 06/15/2021   Benign prostatic hyperplasia without lower urinary tract symptoms 04/22/2019   Gastroesophageal reflux disease without esophagitis 04/19/2018   Thiamine deficiency 04/14/2018   Other cirrhosis of liver ([employer]) 03/13/2018   NASH (nonalcoholic steatohepatitis) 03/13/2018   Low libido 01/28/2018   Hyperlipidemia LDL goal <130 10/25/2017   Insomnia secondary to chronic pain 06/19/2017   Osteoarthritis of left knee 04/26/2017   Degenerative spondylolisthesis 02/24/2016   Routine general medical examination at a health care facility 06/08/2015   Cough variant asthma 02/06/2015   Zinc deficiency 04/12/2014   B12 deficiency anemia 04/08/2014   Vitamin D deficiency 07/26/2012   History of gastric bypass 07/25/2012   Anemia, iron deficiency 07/25/2012   Status post bariatric surgery 07/25/2012   NARCOTIC ABUSE 03/25/2009   EXOGENOUS OBESITY 08/22/2007   Osteoarthritis of right knee 03/12/2007   Essential hypertension 10/08/2006   BACK PAIN, LUMBAR 10/08/2006   PCP:  [name], MD Pharmacy:   [place], [address] [place] [place] [address] Phone: [phone] Fax: [facility] [phone] - [place], [place] - [address] AT [place] [address] [place]  [address] Phone: [phone] Fax: [phone]  [place], [place] [address] [place] [address] Phone:  [phone] Fax: [place], [place] [address] [place] [address] [place] [address] Phone: [phone] Fax: [phone]     Social Determinants of Health (SDOH) Social History: SDOH Screenings   Food Insecurity: No Food Insecurity (12/01/2021)  Transportation Needs: No Transportation Needs (12/01/2021)  Utilities: Not At Risk (12/01/2021)  Alcohol Screen: Low Risk  (12/01/2021)  Depression (PHQ2-9): Low Risk  (03/29/2022)  Financial Resource Strain: High Risk (12/01/2021)  Physical Activity: Inactive (12/01/2021)  Social Connections: Socially Integrated (12/01/2021)  Stress: No Stress Concern Present (12/01/2021)  Tobacco Use: Low Risk  (04/17/2022)   SDOH Interventions:     Readmission Risk Interventions     No data to display         [name], RN, BSN  Trauma/Neuro ICU Case Manager [phone]

## 2022-04-18 NOTE — Progress Notes (Signed)
Trauma/Critical Care Follow Up Note  Subjective:    Overnight Issues:   Objective:  Vital signs for last 24 hours: Temp:  [97.5 F (36.4 C)-98.8 F (37.1 C)] 98.5 F (36.9 C) (02/13 0544) Pulse Rate:  [64-84] 83 (02/13 0544) Resp:  [9-28] 17 (02/13 0544) BP: (98-135)/(39-85) 118/65 (02/13 0544) SpO2:  [93 %-100 %] 100 % (02/13 0544)  Hemodynamic parameters for last 24 hours:    Intake/Output from previous day: 02/12 0701 - 02/13 0700 In: 2016.6 [I.V.:1016.2; IV Piggyback:1000.5] Out: 2820 [Urine:2520; Blood:300]  Intake/Output this shift: No intake/output data recorded.  Vent settings for last 24 hours:    Physical Exam:  Gen: comfortable, no distress Neuro: non-focal exam HEENT: PERRL Neck: supple CV: RRR Pulm: unlabored breathing Abd: soft, NT GU: clear yellow urine, foley Extr: wwp, no edema, RUE splinted   Results for orders placed or performed during the hospital encounter of 04/14/22 (from the past 24 hour(s))  CBC     Status: Abnormal   Collection Time: 04/18/22  1:11 AM  Result Value Ref Range   WBC 6.2 4.0 - 10.5 K/uL   RBC 3.05 (L) 4.22 - 5.81 MIL/uL   Hemoglobin 8.7 (L) 13.0 - 17.0 g/dL   HCT 27.4 (L) 39.0 - 52.0 %   MCV 89.8 80.0 - 100.0 fL   MCH 28.5 26.0 - 34.0 pg   MCHC 31.8 30.0 - 36.0 g/dL   RDW 13.3 11.5 - 15.5 %   Platelets 205 150 - 400 K/uL   nRBC 0.0 0.0 - 0.2 %  Basic metabolic panel     Status: Abnormal   Collection Time: 04/18/22  1:11 AM  Result Value Ref Range   Sodium 135 135 - 145 mmol/L   Potassium 5.0 3.5 - 5.1 mmol/L   Chloride 102 98 - 111 mmol/L   CO2 27 22 - 32 mmol/L   Glucose, Bld 129 (H) 70 - 99 mg/dL   BUN 18 6 - 20 mg/dL   Creatinine, Ser 0.88 0.61 - 1.24 mg/dL   Calcium 8.0 (L) 8.9 - 10.3 mg/dL   GFR, Estimated >60 >60 mL/min   Anion gap 6 5 - 15    Assessment & Plan:  Present on Admission:  Fall    LOS: 3 days   Additional comments:I reviewed the patient's new clinical lab test results.   and  I reviewed the patients new imaging test results.    FFH   Right rib fxs 2-12 - pain control, IS, pulm toilet Small R pneumothorax - repeat CXR stable  Right comminuted scapula fx - ortho c/s, non-op Possible L scapula fx - ortho c/s, Dr. Percell Miller, WBAT Compression fx of C7, T3-5 - NSGY c/s, continue collar Right humeral fx - ortho c/s, Dr. Griffin Basil, OR 2/12 for ORIF Right olecranon fx - ortho c/s, NWB Right possible radial neck fx - ortho c/s, NWB Right lower back hematoma - monitor AKI - normalized with fluids, monitor Elevated transaminases - downtrending T12-L5 right TP fx - pain control Acute blood loss anemia - trend R ear pain - looked with otoscope 2/12, nothing concerning, will c/s ENT non-emergently  Shock - off levo H/o gastric bypass at [place] 2008 - avoid nsaids  Chronic back pain on chronic narcotics - pain management by Dr. Suella Broad at [facility], escalate regimen to max tylenol, increase gabapentin, lido patches, incr oxycontin FEN - regular diet, escalate bowel regimen DVT - SCDs, LMWH 40BID Dispo - 4NP, OR today  Jesusita Oka, MD Trauma & General Surgery Please use [email or web address] to contact on call provider  04/18/2022  *Care during the described time interval was provided by me. I have reviewed this patient's available data, including medical history, events of note, physical examination and test results as part of my evaluation.

## 2022-04-18 NOTE — Care Management Important Message (Signed)
Important Message  Patient Details  Name: Bradley [name] MRN: [id number] Date of Birth: [birth year]   Medicare Important Message Given:  Yes     Hannah Beat 04/18/2022, 2:10 PM

## 2022-04-18 NOTE — Plan of Care (Signed)

## 2022-04-18 NOTE — Progress Notes (Addendum)
Physical Therapy Treatment Patient Details Name: Bradley [name] MRN: [id number] DOB: [birth year] Today's Date: 04/18/2022   History of Present Illness 52 y.o. male who presents to [facility] hospital on 04/14/2022 after he fell through a roof. Imaging demonstrates multiple injuries:  R rib fxs 2-12, R pneumothorax, R scapula fx and possible L, C7 spinous process fx, R humeral fx, R olecranon fx, possible R radial neck fx, T12-L5 R TP fxs. Pt underwent R humerus ORIF and ulnar nerve neurolysis with anterior transposition on 2/12. PMHx: anxiety, arthritis, CAD, GERD, heart murmur, HTN, gastric bypass, R TKA    PT Comments    Pt is limited by pain and pre-syncopal episode during transfer this session. Pt with reduced verbal responsiveness during and post-transfer, orthostatic BP noted. Pt recovers when in supine and returns to baseline cognitively prior to PT departure. Pt continues to require significant assistance to mobilize in bed due to weakness and pain. PT recommends AIR consult to assess candidacy for inpatient rehab. Acute PT will continue to follow.   Thigh high compression stockings ordered to utilize next session.    Recommendations for follow up therapy are one component of a multi-disciplinary discharge planning process, led by the attending physician.  Recommendations may be updated based on patient status, additional functional criteria and insurance authorization.  Follow Up Recommendations  Acute inpatient rehab (3hours/day)     Assistance Recommended at Discharge Frequent or constant Supervision/Assistance  Patient can return home with the following Two people to help with walking and/or transfers;Two people to help with bathing/dressing/bathroom;Assistance with cooking/housework;Assist for transportation;Help with stairs or ramp for entrance   Equipment Recommendations  Wheelchair (measurements PT);Hospital bed    Recommendations for Other Services Rehab consult     Precautions  / Restrictions Precautions Precautions: Fall;Cervical;Back Precaution Booklet Issued: No Precaution Comments: verbally reviewed back and cervical precautions Required Braces or Orthoses: Cervical Brace;Spinal Brace;Sling;Splint/Cast Cervical Brace: Hard collar;At all times Spinal Brace: Thoracolumbosacral orthotic (for comfort) Splint/Cast: R long arm Restrictions Weight Bearing Restrictions: Yes RUE Weight Bearing: Non weight bearing     Mobility  Bed Mobility Overal bed mobility: Needs Assistance Bed Mobility: Rolling, Sidelying to Sit Rolling: Max assist, +2 for physical assistance Sidelying to sit: Max assist, +2 for physical assistance, HOB elevated       General bed mobility comments: increased time, use of pad to assist in rolling    Transfers Overall transfer level: Needs assistance Equipment used: 2 person hand held assist Transfers: Sit to/from Stand, Bed to chair/wheelchair/BSC Sit to Stand: Min assist, From elevated surface   Step pivot transfers: Mod assist, +2 physical assistance       General transfer comment: pt with reduced resposiveness in standing when pivoting to recliner, one instance of knee buckling, PT/OT assist in pivoting hips to recliner to prevent potential fall    Ambulation/Gait                   Stairs             Wheelchair Mobility    Modified Rankin (Stroke Patients Only)       Balance Overall balance assessment: Needs assistance Sitting-balance support: No upper extremity supported, Feet supported Sitting balance-Leahy Scale: Fair     Standing balance support: Single extremity supported, Reliant on assistive device for balance Standing balance-Leahy Scale: Poor Standing balance comment: minA static standing, orthostatic during transfer to recliner, pre-syncopal episode during transfer  Cognition Arousal/Alertness: Awake/alert Behavior During Therapy: WFL for tasks  assessed/performed Overall Cognitive Status: Within Functional Limits for tasks assessed                                          Exercises      General Comments General comments (skin integrity, edema, etc.): pt on 3L  at start of session, BP 125/59 pre-mobility. during transfer pt with pre-syncopal symptoms during transfer, BP 99/56 once in supine in recliner. Pt becomes verbally responsive after ~1 minute, eyes open throughout. BP returns to 126/60 in supine and remains stable with return to seated in an upright position      Pertinent Vitals/Pain Pain Assessment Pain Assessment: 0-10 Pain Score: 10-Worst pain ever Pain Location: RUE Pain Descriptors / Indicators: Sharp Pain Intervention(s): Patient requesting pain meds-RN notified    Home Living                          Prior Function            PT Goals (current goals can now be found in the care plan section) Acute Rehab PT Goals Patient Stated Goal: ach Progress towards PT goals: Progressing toward goals    Frequency    Min 4X/week      PT Plan Current plan remains appropriate    Co-evaluation PT/OT/SLP Co-Evaluation/Treatment: Yes Reason for Co-Treatment: Complexity of the patient's impairments (multi-system involvement);Necessary to address cognition/behavior during functional activity;For patient/therapist safety;To address functional/ADL transfers PT goals addressed during session: Mobility/safety with mobility;Balance;Strengthening/ROM        AM-PAC PT "6 Clicks" Mobility   Outcome Measure  Help needed turning from your back to your side while in a flat bed without using bedrails?: A Lot Help needed moving from lying on your back to sitting on the side of a flat bed without using bedrails?: A Lot Help needed moving to and from a bed to a chair (including a wheelchair)?: A Lot Help needed standing up from a chair using your arms (e.g., wheelchair or bedside chair)?: A  Little Help needed to walk in hospital room?: Total Help needed climbing 3-5 steps with a railing? : Total 6 Click Score: 11    End of Session Equipment Utilized During Treatment: Cervical collar;Gait belt Activity Tolerance: Treatment limited secondary to medical complications (Comment) (pre-syncope) Patient left: in chair;with call bell/phone within reach;with chair alarm set;with family/visitor present Nurse Communication: Mobility status PT Visit Diagnosis: Unsteadiness on feet (R26.81);Muscle weakness (generalized) (M62.81);Difficulty in walking, not elsewhere classified (R26.2)     Time: [id number] PT Time Calculation (min) (ACUTE ONLY): 38 min  Charges:  $Therapeutic Activity: 23-37 mins                     Zenaida Niece, PT, DPT Acute Rehabilitation Office [place] Dusty Raczkowski 04/18/2022, 11:16 AM

## 2022-04-18 NOTE — Progress Notes (Addendum)
PT was sitting on chair and wanted to return to bed. I called for assistance from [place] with PT. As has chair was being rolled to opposite side of bed PT went unresponsive for ~ 2 minutes. PT's HR did drop to 30 on transport monitor with no discernable P waves with possible junctional rhythm in a narrow complex QRS. Unfortunately, the transport monitor did not communicate with bedside monitor. The ECG was not recorded. He did not have a radial pulse but did have very weak carotid pulse. He was flattened out as much as can be on chair. First blood pressure 12:20 78/41(54), Repeat 12:21 88/46(60) and then 1222 107/60(75). Unresponsiveness lasted ~ 2 minutes with NO seizure like activity. Last pain meds 11:20 2 mg Morphine IV and 1210 15 mg PO Oxycodone. PT returned to baseline mental status. 12 lead was ordered which "Normal Sinus rhythm, minimal voltage criteria for LVH, borderline ECG." Barkley Boards was informed.

## 2022-04-19 LAB — BASIC METABOLIC PANEL
Anion gap: 8 (ref 5–15)
BUN: 15 mg/dL (ref 6–20)
CO2: 30 mmol/L (ref 22–32)
Calcium: 8.1 mg/dL — ABNORMAL LOW (ref 8.9–10.3)
Chloride: 95 mmol/L — ABNORMAL LOW (ref 98–111)
Creatinine, Ser: 0.74 mg/dL (ref 0.61–1.24)
GFR, Estimated: >60 mL/min (ref >60)
Glucose, Bld: 109 mg/dL — ABNORMAL HIGH (ref 70–99)
Potassium: 4.4 mmol/L (ref 3.5–5.1)
Sodium: 133 mmol/L — ABNORMAL LOW (ref 135–145)

## 2022-04-19 LAB — CBC
HCT: 24.5 % — ABNORMAL LOW (ref 39.0–52.0)
Hemoglobin: 7.8 g/dL — ABNORMAL LOW (ref 13.0–17.0)
MCH: 28.6 pg (ref 26.0–34.0)
MCHC: 31.8 g/dL (ref 30.0–36.0)
MCV: 89.7 fL (ref 80.0–100.0)
Platelets: 188 10*3/uL (ref 150–400)
RBC: 2.73 MIL/uL — ABNORMAL LOW (ref 4.22–5.81)
RDW: 13.6 % (ref 11.5–15.5)
WBC: 5.6 10*3/uL (ref 4.0–10.5)
nRBC: 0 % (ref 0.0–0.2)

## 2022-04-19 MED ORDER — OXYCODONE HCL ER 15 MG PO T12A
30.0000 mg | EXTENDED_RELEASE_TABLET | Freq: Two times a day (BID) | ORAL | Status: DC
  Administered 2022-04-19 – 2022-04-21 (×5): 30 mg via ORAL
  Filled 2022-04-19 (×5): qty 2

## 2022-04-19 MED ORDER — GABAPENTIN 300 MG PO CAPS
900.0000 mg | ORAL_CAPSULE | Freq: Three times a day (TID) | ORAL | Status: DC
  Administered 2022-04-19 – 2022-04-21 (×7): 900 mg via ORAL
  Filled 2022-04-19 (×7): qty 3

## 2022-04-19 NOTE — Progress Notes (Signed)
Inpatient Rehab Coordinator Note:  I met with pt and his spouse, [name], at bedside to discuss CIR recommendations and goals/expectations of CIR stay.  We reviewed 3 hrs/day of therapy, physician follow up, and average length of stay 2 weeks (dependent upon progress) with goals of supervision to mod I.  [name] works from home and is comfortable providing some physical assist for mobility/ADLs should pt require that at discharge from [place].  We reviewed need for insurance auth and I will start that process today.    Shann Medal, PT, DPT Admissions Coordinator [phone] 04/19/22  4:05 PM

## 2022-04-19 NOTE — Progress Notes (Signed)
Physical Therapy Treatment Patient Details Name: ANDREUS MEIGHEN MRN: [id number] DOB: [birth year] Today's Date: 04/19/2022   History of Present Illness 52 y.o. male who presents to [facility] hospital on 04/14/2022 after he fell through a roof. Imaging demonstrates multiple injuries:  R rib fxs 2-12, R pneumothorax, R scapula fx and possible L, C7 spinous process fx, R humeral fx, R olecranon fx, possible R radial neck fx, T12-L5 R TP fxs. Pt underwent R humerus ORIF and ulnar nerve neurolysis with anterior transposition on 2/12. PMHx: anxiety, arthritis, CAD, GERD, heart murmur, HTN, gastric bypass, R TKA    PT Comments    Pt tolerates treatment well, with improved quality of bed mobility and transfer. Pt remains orthostatic but is asymptomatic this session, vital signs documented below in General Comments. PT encourages performance of LE HEP to improve strength. PT will continue to follow in an effort to improve mobility quality and tolerance while reducing falls risk. PT continues to recommend AIR admission.   Recommendations for follow up therapy are one component of a multi-disciplinary discharge planning process, led by the attending physician.  Recommendations may be updated based on patient status, additional functional criteria and insurance authorization.  Follow Up Recommendations  Acute inpatient rehab (3hours/day)     Assistance Recommended at Discharge Frequent or constant Supervision/Assistance  Patient can return home with the following Two people to help with walking and/or transfers;Two people to help with bathing/dressing/bathroom;Assistance with cooking/housework;Assist for transportation;Help with stairs or ramp for entrance   Equipment Recommendations  Wheelchair (measurements PT);Hospital bed    Recommendations for Other Services       Precautions / Restrictions Precautions Precautions: Fall;Cervical;Back Precaution Booklet Issued: No Precaution Comments: verbally reviewed  back and cervical precautions. TLSO not used during session. Required Braces or Orthoses: Cervical Brace;Spinal Brace;Sling;Splint/Cast Cervical Brace: Hard collar;At all times Spinal Brace: Thoracolumbosacral orthotic (for comfort only) Splint/Cast: R long arm Restrictions Weight Bearing Restrictions: Yes RUE Weight Bearing: Non weight bearing     Mobility  Bed Mobility Overal bed mobility: Needs Assistance Bed Mobility: Rolling, Sidelying to Sit Rolling: Max assist Sidelying to sit: Max assist            Transfers Overall transfer level: Needs assistance Equipment used: 1 person hand held assist Transfers: Sit to/from Stand, Bed to chair/wheelchair/BSC Sit to Stand: Mod assist   Step pivot transfers: Mod assist       General transfer comment: pt pulls with LUE through PT hand hold, assist at waist to improve stability when stepping to recliner    Ambulation/Gait                   Stairs             Wheelchair Mobility    Modified Rankin (Stroke Patients Only)       Balance Overall balance assessment: Needs assistance Sitting-balance support: No upper extremity supported, Feet supported Sitting balance-Leahy Scale: Fair     Standing balance support: Single extremity supported, Reliant on assistive device for balance Standing balance-Leahy Scale: Poor Standing balance comment: minA                            Cognition Arousal/Alertness: Awake/alert Behavior During Therapy: WFL for tasks assessed/performed, Flat affect Overall Cognitive Status: Within Functional Limits for tasks assessed  Exercises General Exercises - Lower Extremity Ankle Circles/Pumps: AROM, Both, 10 reps Quad Sets: AROM, Both, 10 reps    General Comments General comments (skin integrity, edema, etc.): pt on 3L [place] upon arrival, PT weans to room air with mobility, sats remain in low 90s when  mobilizing. PT returns to 3L [place] upon completion of transfer. Pt denies dizziness, no pre-syncopal symptoms this session. Remains orthostatic when mobilizing. BP supine 147/82, sitting 126/77, s/p transfer 107/67, 5 minutes post-transfer 113/81. HR stable in low 80s.      Pertinent Vitals/Pain Pain Assessment Pain Assessment: 0-10 Pain Score: 9  Pain Location: RUE Pain Descriptors / Indicators: Burning Pain Intervention(s): Patient requesting pain meds-RN notified    Home Living                          Prior Function            PT Goals (current goals can now be found in the care plan section) Acute Rehab PT Goals Patient Stated Goal: reduce pain, improve activity tolerance Progress towards PT goals: Progressing toward goals    Frequency    Min 4X/week      PT Plan Current plan remains appropriate    Co-evaluation              AM-PAC PT "6 Clicks" Mobility   Outcome Measure  Help needed turning from your back to your side while in a flat bed without using bedrails?: A Lot Help needed moving from lying on your back to sitting on the side of a flat bed without using bedrails?: A Lot Help needed moving to and from a bed to a chair (including a wheelchair)?: A Lot Help needed standing up from a chair using your arms (e.g., wheelchair or bedside chair)?: A Lot Help needed to walk in hospital room?: Total Help needed climbing 3-5 steps with a railing? : Total 6 Click Score: 10    End of Session Equipment Utilized During Treatment: Cervical collar Activity Tolerance: Patient tolerated treatment well Patient left: in chair;with call bell/phone within reach;with chair alarm set;with family/visitor present Nurse Communication: Mobility status PT Visit Diagnosis: Unsteadiness on feet (R26.81);Muscle weakness (generalized) (M62.81);Difficulty in walking, not elsewhere classified (R26.2)     Time: [id number] PT Time Calculation (min) (ACUTE ONLY): 29  min  Charges:  $Therapeutic Activity: 23-37 mins                     Zenaida Niece, PT, DPT Acute Rehabilitation Office [place] Yandriel Boening 04/19/2022, 11:22 AM

## 2022-04-19 NOTE — Progress Notes (Signed)
Trauma/Critical Care Follow Up Note  Subjective:    Overnight Issues:   Objective:  Vital signs for last 24 hours: Temp:  [97.7 F (36.5 C)-99 F (37.2 C)] 99 F (37.2 C) (02/14 0750) Pulse Rate:  [74-84] 75 (02/14 0814) Resp:  [12-19] 12 (02/14 0814) BP: (126-195)/(35-95) 141/77 (02/14 0814) SpO2:  [91 %-100 %] 100 % (02/14 0814)  Hemodynamic parameters for last 24 hours:    Intake/Output from previous day: 02/13 0701 - 02/14 0700 In: 720 [P.O.:720] Out: 1251 [Urine:1251]  Intake/Output this shift: No intake/output data recorded.  Vent settings for last 24 hours:    Physical Exam:  Gen: comfortable, no distress Neuro: non-focal exam HEENT: PERRL Neck: supple CV: RRR Pulm: unlabored breathing Abd: soft, NT GU: clear yellow urine Extr: wwp, no edema   Results for orders placed or performed during the hospital encounter of 04/14/22 (from the past 24 hour(s))  CBC     Status: Abnormal   Collection Time: 04/19/22  3:09 AM  Result Value Ref Range   WBC 5.6 4.0 - 10.5 K/uL   RBC 2.73 (L) 4.22 - 5.81 MIL/uL   Hemoglobin 7.8 (L) 13.0 - 17.0 g/dL   HCT 24.5 (L) 39.0 - 52.0 %   MCV 89.7 80.0 - 100.0 fL   MCH 28.6 26.0 - 34.0 pg   MCHC 31.8 30.0 - 36.0 g/dL   RDW 13.6 11.5 - 15.5 %   Platelets 188 150 - 400 K/uL   nRBC 0.0 0.0 - 0.2 %  Basic metabolic panel     Status: Abnormal   Collection Time: 04/19/22  3:09 AM  Result Value Ref Range   Sodium 133 (L) 135 - 145 mmol/L   Potassium 4.4 3.5 - 5.1 mmol/L   Chloride 95 (L) 98 - 111 mmol/L   CO2 30 22 - 32 mmol/L   Glucose, Bld 109 (H) 70 - 99 mg/dL   BUN 15 6 - 20 mg/dL   Creatinine, Ser 0.74 0.61 - 1.24 mg/dL   Calcium 8.1 (L) 8.9 - 10.3 mg/dL   GFR, Estimated >60 >60 mL/min   Anion gap 8 5 - 15    Assessment & Plan: The plan of care was discussed with the bedside nurse for the day, who is in agreement with this plan and no additional concerns were raised.   Present on Admission:  Fall    LOS: 4  days   Additional comments:I reviewed the patient's new clinical lab test results.   and I reviewed the patients new imaging test results.    FFH   Right rib fxs 2-12 - pain control, IS, pulm toilet Small R pneumothorax - repeat CXR stable  Right comminuted scapula fx - ortho c/s, non-op Possible L scapula fx - ortho c/s, Dr. Percell Miller, WBAT Compression fx of C7, T3-5 - NSGY c/s, continue collar Right humeral fx - ortho c/s, Dr. Griffin Basil, OR 2/12 for ORIF Right olecranon fx - ortho c/s, NWB Right possible radial neck fx - ortho c/s, NWB Right lower back hematoma - monitor AKI - normalized with fluids, monitor Elevated transaminases - downtrending T12-L5 right TP fx - pain control Acute blood loss anemia - trend R ear pain - looked with otoscope 2/12, nothing concerning, will c/s ENT non-emergently  Shock - off levo H/o gastric bypass at [place] 2008 - avoid nsaids  Chronic back pain on chronic narcotics - pain management by Dr. Suella Broad at [facility], escalate regimen to max tylenol, increase gabapentin,  lido patches, incr oxycontin to 30 today FEN - regular diet, escalate bowel regimen DVT - SCDs, LMWH 40BID Dispo - 4NP, OR today    Jesusita Oka, MD Trauma & General Surgery Please use [email or web address] to contact on call provider  04/19/2022  *Care during the described time interval was provided by me. I have reviewed this patient's available data, including medical history, events of note, physical examination and test results as part of my evaluation.

## 2022-04-20 LAB — BASIC METABOLIC PANEL
Anion gap: 9 (ref 5–15)
BUN: 15 mg/dL (ref 6–20)
CO2: 30 mmol/L (ref 22–32)
Calcium: 8.2 mg/dL — ABNORMAL LOW (ref 8.9–10.3)
Chloride: 95 mmol/L — ABNORMAL LOW (ref 98–111)
Creatinine, Ser: 0.68 mg/dL (ref 0.61–1.24)
GFR, Estimated: >60 mL/min (ref >60)
Glucose, Bld: 95 mg/dL (ref 70–99)
Potassium: 4.3 mmol/L (ref 3.5–5.1)
Sodium: 134 mmol/L — ABNORMAL LOW (ref 135–145)

## 2022-04-20 LAB — CBC
HCT: 23.2 % — ABNORMAL LOW (ref 39.0–52.0)
Hemoglobin: 7.7 g/dL — ABNORMAL LOW (ref 13.0–17.0)
MCH: 28.9 pg (ref 26.0–34.0)
MCHC: 33.2 g/dL (ref 30.0–36.0)
MCV: 87.2 fL (ref 80.0–100.0)
Platelets: 189 10*3/uL (ref 150–400)
RBC: 2.66 MIL/uL — ABNORMAL LOW (ref 4.22–5.81)
RDW: 13.5 % (ref 11.5–15.5)
WBC: 5.3 10*3/uL (ref 4.0–10.5)
nRBC: 0 % (ref 0.0–0.2)

## 2022-04-20 MED ORDER — HYDROMORPHONE HCL 1 MG/ML IJ SOLN
0.2500 mg | Freq: Three times a day (TID) | INTRAMUSCULAR | Status: DC | PRN
  Administered 2022-04-20 – 2022-04-21 (×2): 0.25 mg via INTRAVENOUS
  Filled 2022-04-20 (×2): qty 0.5

## 2022-04-20 NOTE — Progress Notes (Signed)
Physical Therapy Treatment Patient Details Name: Bradley [name] MRN: [id number] DOB: Sep 13, 1970 Today's Date: 04/20/2022   History of Present Illness 52 y.o. male who presents to [facility] hospital on 04/14/2022 after he fell through a roof. Imaging demonstrates multiple injuries:  R rib fxs 2-12, R pneumothorax, R scapula fx and possible L, C7 spinous process fx, R humeral fx, R olecranon fx, possible R radial neck fx, T12-L5 R TP fxs. Pt underwent R humerus ORIF and ulnar nerve neurolysis with anterior transposition on 2/12. PMHx: anxiety, arthritis, CAD, GERD, heart murmur, HTN, gastric bypass, R TKA    PT Comments    Pt reporting being OOB x3 already today, but is agreeable to attempt ambulation to/from bathroom. Pt continues to have some periods of presumed orthostasis, BP attempts on UE reading suspiciously low (70s/50s when pt was conversing with [place] and wife, 100s/60s when assessed on LE). This date, pt appeared to develop orthostatic hypotension after standing from toilet, as pt became less verbally responsive and developed pallor. Once in recliner and reclined, pt's BP was 102/60 and pt asymptomatic.   Pt ambulatory to the bathroom this date, struggles most with bed mobility from a physical assist standpoint at this time. PT to continue to follow, plan remains appropriate.    Recommendations for follow up therapy are one component of a multi-disciplinary discharge planning process, led by the attending physician.  Recommendations may be updated based on patient status, additional functional criteria and insurance authorization.  Follow Up Recommendations  Acute inpatient rehab (3hours/day)     Assistance Recommended at Discharge Frequent or constant Supervision/Assistance  Patient can return home with the following Assistance with cooking/housework;Assist for transportation;Help with stairs or ramp for entrance;A lot of help with walking and/or transfers;A lot of help with  bathing/dressing/bathroom   Equipment Recommendations  Wheelchair (measurements PT);Hospital bed    Recommendations for Other Services       Precautions / Restrictions Precautions Precautions: Fall;Cervical;Back Precaution Booklet Issued: No Required Braces or Orthoses: Cervical Brace;Spinal Brace;Sling;Splint/Cast Cervical Brace: Hard collar;At all times Spinal Brace: Thoracolumbosacral orthotic (for comfort only; not utilized today) Restrictions Weight Bearing Restrictions: Yes RUE Weight Bearing: Non weight bearing     Mobility  Bed Mobility Overal bed mobility: Needs Assistance Bed Mobility: Supine to Sit, Sit to Supine     Supine to sit: Mod assist, HOB elevated Sit to supine: Max assist, +2 for physical assistance, +2 for safety/equipment   General bed mobility comments: Increased difficulty maintaining back precautions during sit to supine transition requiring VC to refrain from holding onto bed rail with LUE.    Transfers Overall transfer level: Needs assistance Equipment used: 1 person hand held assist Transfers: Sit to/from Stand, Bed to chair/wheelchair/BSC Sit to Stand: Mod assist, +2 safety/equipment   Step pivot transfers: Mod assist, +2 safety/equipment       General transfer comment: assist for power up, rise, steadying, and assist at trunk to transition from [facility] to recliner during suspected orthostatic event    Ambulation/Gait Ambulation/Gait assistance: Mod assist, +2 safety/equipment Gait Distance (Feet): 10 Feet Assistive device: 1 person hand held assist Gait Pattern/deviations: Step-through pattern, Decreased stride length, Trunk flexed       General Gait Details: assist to steady, +2 for lines/leads. Cues for upright posture, room navigation   Stairs             Wheelchair Mobility    Modified Rankin (Stroke Patients Only)       Balance Overall balance assessment: Needs assistance  Sitting-balance support: No upper  extremity supported, Feet supported Sitting balance-Leahy Scale: Fair Sitting balance - Comments: sitting EOB   Standing balance support: Single extremity supported, Reliant on assistive device for balance Standing balance-Leahy Scale: Fair Standing balance comment: Demonstrated while standing in bathroom at toilet                            Cognition Arousal/Alertness: Awake/alert (Sleepy) Behavior During Therapy: WFL for tasks assessed/performed, Flat affect Overall Cognitive Status: Within Functional Limits for tasks assessed                                 General Comments: overall WFL- pt did have intermittent decrease in arousal throughout session which may have been due to orthostatic type behavior and fatigue level.        Exercises      General Comments General comments (skin integrity, edema, etc.): Pt on Room Ait upon therapy arrival. BP checked on left arm with 2 different BP cuffs although results were extemely low and suspiciously inaccurate. BP was taken on left calf instead with reading 102/63. HR and SpO2 stable.      Pertinent Vitals/Pain Pain Assessment Pain Assessment: Faces Faces Pain Scale: Hurts whole lot Pain Location: RUE, ribs, generalized with movement Pain Descriptors / Indicators: Grimacing, Guarding, Discomfort Pain Intervention(s): Repositioned, Limited activity within patient's tolerance, Premedicated before session, Monitored during session    Home Living                          Prior Function            PT Goals (current goals can now be found in the care plan section) Acute Rehab PT Goals Patient Stated Goal: reduce pain, improve activity tolerance PT Goal Formulation: With patient/family Time For Goal Achievement: 04/30/22 Potential to Achieve Goals: Good Progress towards PT goals: Progressing toward goals    Frequency    Min 4X/week      PT Plan Current plan remains appropriate     Co-evaluation PT/OT/SLP Co-Evaluation/Treatment: Yes Reason for Co-Treatment: Complexity of the patient's impairments (multi-system involvement);Necessary to address cognition/behavior during functional activity;For patient/therapist safety;To address functional/ADL transfers PT goals addressed during session: Mobility/safety with mobility;Balance        AM-PAC PT "6 Clicks" Mobility   Outcome Measure  Help needed turning from your back to your side while in a flat bed without using bedrails?: A Lot Help needed moving from lying on your back to sitting on the side of a flat bed without using bedrails?: A Lot Help needed moving to and from a bed to a chair (including a wheelchair)?: A Lot Help needed standing up from a chair using your arms (e.g., wheelchair or bedside chair)?: A Lot Help needed to walk in hospital room?: A Lot Help needed climbing 3-5 steps with a railing? : Total 6 Click Score: 11    End of Session Equipment Utilized During Treatment: Cervical collar Activity Tolerance: Patient limited by fatigue;Treatment limited secondary to medical complications (Comment) (decreased arousal during attempted toileting, suspect orthostatic) Patient left: with call bell/phone within reach;with family/visitor present;in bed;with bed alarm set Nurse Communication: Mobility status PT Visit Diagnosis: Unsteadiness on feet (R26.81);Muscle weakness (generalized) (M62.81);Difficulty in walking, not elsewhere classified (R26.2)     Time: [id number] PT Time Calculation (min) (ACUTE ONLY): 48 min  Charges:  $  Therapeutic Activity: 8-22 mins                     Stacie Glaze, PT DPT Acute Rehabilitation Services Pager [phone]  Office [phone]   Louis Matte 04/20/2022, 4:14 PM

## 2022-04-20 NOTE — Progress Notes (Signed)
3 Days Post-Op   Subjective/Chief Complaint: Pt with no acute changes Pain control better today   Objective: Vital signs in last 24 hours: Temp:  [98.2 F (36.8 C)-98.5 F (36.9 C)] 98.4 F (36.9 C) (02/15 0737) Pulse Rate:  [75-91] 81 (02/15 0737) Resp:  [10-16] 16 (02/15 0737) BP: (94-141)/(55-81) 100/61 (02/15 0737) SpO2:  [95 %-100 %] 96 % (02/15 0737) Last BM Date :  (PTA)  Intake/Output from previous day: 02/14 0701 - 02/15 0700 In: 840 [P.O.:840] Out: 1550 [Urine:1550] Intake/Output this shift: No intake/output data recorded.  Physical Exam:  Gen: comfortable, no distress Neuro: non-focal exam HEENT: PERRL Neck: supple CV: RRR Pulm: unlabored breathing Abd: soft, NT GU: clear yellow urine Extr: wwp, no edema  Lab Results:  Recent Labs    04/19/22 0309 04/20/22 0328  WBC 5.6 5.3  HGB 7.8* 7.7*  HCT 24.5* 23.2*  PLT 188 189   BMET Recent Labs    04/19/22 0309 04/20/22 0328  NA 133* 134*  K 4.4 4.3  CL 95* 95*  CO2 30 30  GLUCOSE 109* 95  BUN 15 15  CREATININE 0.74 0.68  CALCIUM 8.1* 8.2*   PT/INR No results for input(s): "LABPROT", "INR" in the last 72 hours. ABG No results for input(s): "PHART", "HCO3" in the last 72 hours.  Invalid input(s): "PCO2", "PO2"  Studies/Results: No results found.  Anti-infectives: Anti-infectives (From admission, onward)    Start     Dose/Rate Route Frequency Ordered Stop   04/17/22 2000  ceFAZolin (ANCEF) IVPB 2g/100 mL premix        2 g 200 mL/hr over 30 Minutes Intravenous Every 6 hours 04/17/22 1902 04/18/22 [id number]   04/17/22 1405  vancomycin (VANCOCIN) powder  Status:  Discontinued          As needed 04/17/22 1405 04/17/22 1703       Assessment/Plan: FFH   Right rib fxs 2-12 - pain control, IS, pulm toilet Small R pneumothorax - repeat CXR stable  Right comminuted scapula fx - ortho c/s, non-op Possible L scapula fx - ortho c/s, Dr. Percell Miller, WBAT Compression fx of C7, T3-5 - NSGY c/s,  continue collar Right humeral fx - ortho c/s, Dr. Griffin Basil, OR 2/12 for ORIF Right olecranon fx - ortho c/s, NWB Right possible radial neck fx - ortho c/s, NWB Right lower back hematoma - monitor AKI - normalized with fluids, monitor Elevated transaminases - downtrending T12-L5 right TP fx - pain control Acute blood loss anemia - trend R ear pain - looked with otoscope 2/12, nothing concerning, will c/s ENT non-emergently  Shock - off levo H/o gastric bypass at [place] 2008 - avoid nsaids  Chronic back pain on chronic narcotics - pain management by Dr. Suella Broad at [facility], escalate regimen to max tylenol, increase gabapentin, lido patches, incr oxycontin to 30 today  FEN - regular diet, escalate bowel regimen DVT - SCDs, LMWH 40BID   Dispo - 4NP, CIR pending  LOS: 5 days    Ralene Ok 04/20/2022

## 2022-04-20 NOTE — Anesthesia Postprocedure Evaluation (Signed)
Anesthesia Post Note  Patient: Bradley [name]  Procedure(s) Performed: OPEN REDUCTION INTERNAL FIXATION (ORIF) RIGHT DISTAL HUMERUS FRACTURE (Right: Arm Upper)     Patient location during evaluation: PACU Anesthesia Type: General Level of consciousness: sedated Pain management: pain level controlled Vital Signs Assessment: post-procedure vital signs reviewed and stable Respiratory status: spontaneous breathing and respiratory function stable Cardiovascular status: stable Postop Assessment: no apparent nausea or vomiting Anesthetic complications: yes   Encounter Notable Events  Notable Event Outcome Phase Comment  Difficult to intubate - expected  Intraprocedure Filed from anesthesia note documentation.                  Marise Knapper DANIEL

## 2022-04-20 NOTE — H&P (Signed)
Physical Medicine and Rehabilitation Admission H&P    Chief Complaint  Patient presents with   Functional deficits due to polytrauma    HPI: Bradley [name]. 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 He was found to have right rib Fx 2-12 with small R-PTX, right comminuted scapula Fx, right humerus Fx, radial neck Fx, lower back hematoma, right T12-L5 right TP Fx and ABLA. He was placed in long arm splint and made NWB. MRI C spine showed mild compression Fx of C7, T3, T4 and T5. C collar to continue with TLSO for comfort prn per NS. Dr. Griffin Basil consulted and patient underwent ORIF right distal humerus, open reduction of humeral shaft with internal fixation and right ulna nerve neurolysis with anterior transposition on 04/17/22. Post op to be NWB and dressings to be left intact till follow up.   Abnormal LFTs resolving and ABLA being monitored. Therapy has been working with patient who had syncope with unresponsiveness and bradycardia on 02/13. Pain control has been an issues and oxycontin added and being titrated up for better control. He has had issues with constipation therefore treated with multiple laxatives/enema today.  Flomax increased to 0.8 mg due to retention requiring I/O cath for 500 cc yesterday. Therapy has been working with patient who continues to be limited by weakness, pain,orthostatic symptoms with syncope/presyncope. CIR recommended due to functional decline.     Review of Systems  Constitutional:  Negative for fever.  HENT:  Positive for ear pain.   Respiratory:  Negative for cough and shortness of breath.   Cardiovascular:  Negative for chest pain and leg swelling.       Right lower chest wall pain--worse with activity  Gastrointestinal:  Positive for abdominal pain and constipation. Negative for heartburn and nausea.   Genitourinary:  Negative for dysuria.  Musculoskeletal:  Positive for back pain, joint pain and myalgias.  Neurological:  Positive for dizziness, sensory change and weakness. Negative for tingling and headaches.  Psychiatric/Behavioral:  The patient has insomnia (comes and goes).      Past Medical History:  Diagnosis Date   Allergy    Anxiety    Aortic valve sclerosis    Arthritis    Arthropathy, unspecified, other specified sites    CAD (coronary artery disease)    01/09/20: patient denied; faint coronary calcifications on 2016 CT   Depression    GERD (gastroesophageal reflux disease)    Heart murmur    mild MR, mild AS 06/2019 (Dr. [name])   History of kidney stones    Hypertension     " no longer have hypertension since gastric bypass "   Obesity, unspecified    Other, mixed, or unspecified nondependent drug abuse, unspecified    Personal history of urinary calculi    Priapism     Past Surgical History:  Procedure Laterality Date   ANKLE SURGERY Left 2003   ANTERIOR LAT LUMBAR FUSION Left 02/24/2016   Procedure: LEFT LUMBAR TWO-THREE  ANTERIOR LATERAL LUMBAR FUSION  ;  Surgeon: Earnie Larsson, MD;  Location: [facility];  Service: Neurosurgery;  Laterality: Left;   ANTERIOR LATERAL LUMBAR FUSION WITH PERCUTANEOUS SCREW 1 LEVEL Right 01/13/2020   Procedure: RIGHT LUMBAR ONE-TWO ANTERIOR LATERAL LUMBAR FUSION WITH PERCUTANEOUS SCREW PLACEMENT;  Surgeon: Earnie Larsson, MD;  Location: [facility];  Service: Neurosurgery;  Laterality: Right;  3C   CHOLECYSTECTOMY N/A 01/16/2022   Procedure: LAPAROSCOPIC CHOLECYSTECTOMY WITH INTRAOPERATIVE CHOLANGIOGRAM AND ICG DYE;  Surgeon: Greer Pickerel, MD;  Location: [facility];  Service: General;  Laterality: N/A;   GASTRIC BYPASS  2008   HERNIA REPAIR Left 2005   groin   INCISION AND DRAINAGE ABSCESS Right 07/06/2004   great toe   IRRIGATION AND DEBRIDEMENT ABSCESS Right 07/08/2004   great toe   KNEE ARTHROPLASTY Right 04/26/2017   Procedure: RIGHT  TOTAL KNEE ARTHROPLASTY WITH COMPUTER NAVIGATION;  Surgeon: Rod Can, MD;  Location: [facility];  Service: Orthopedics;  Laterality: Right;  Needs RNFA   KNEE SURGERY Right 2004   LUMBAR PERCUTANEOUS PEDICLE SCREW 1 LEVEL Left 02/24/2016   Procedure: LUMBAR PERCUTANEOUS PEDICLE SCREW 1 LEVEL;  Surgeon: Earnie Larsson, MD;  Location: [facility];  Service: Neurosurgery;  Laterality: Left;   MICRODISCECTOMY LUMBAR Left 08/24/2008   T12 - L1   ORIF HUMERUS FRACTURE Right 04/17/2022   Procedure: OPEN REDUCTION INTERNAL FIXATION (ORIF) RIGHT DISTAL HUMERUS FRACTURE;  Surgeon: Hiram Gash, MD;  Location: [facility];  Service: Orthopedics;  Laterality: Right;   panectomy  2010   TOTAL KNEE ARTHROPLASTY Right 04/2017    Family History  Problem Relation Age of Onset   Arthritis Mother    Colon cancer Mother    Breast cancer Mother        twice   Kidney failure Mother    Heart disease Father    Hypertension Father    Esophageal cancer Neg Hx    Inflammatory bowel disease Neg Hx    Liver disease Neg Hx    Pancreatic cancer Neg Hx    Stomach cancer Neg Hx    Rectal cancer Neg Hx     Social History:  Married. Independent PTA. Disabled due to back pain. Per  reports that he has never smoked. He has never used smokeless tobacco. He reports that he does not drink alcohol and does not use drugs.   Allergies  Allergen Reactions   Codeine Hives and Itching   Nsaids Other (See Comments)    H/o gastric bypass - can cause ulcers    Medications Prior to Admission  Medication Sig Dispense Refill   DODEX 1000 MCG/ML injection ADMINISTER 1 ML(1000 MCG) IN THE MUSCLE EVERY 30 DAYS (Patient taking differently: Inject 1,000 mcg into the muscle every 30 (thirty) days.) 3 mL 0   DULoxetine (CYMBALTA) 20 MG capsule Take 20 mg by mouth 2 (two) times daily.     Fe Fum-Fe Poly-Vit C-Lactobac (FUSION) 65-65-25-30 MG CAPS Take 1 capsule by mouth 2 (two) times daily. [id number] capsule 1   folic acid (FOLVITE) 1 MG tablet TAKE  1 TABLET(1 MG) BY MOUTH DAILY (Patient taking differently: Take 1 mg by mouth daily. TAKE 1 TABLET(1 MG) BY MOUTH DAILY) 90 tablet 1   gabapentin (NEURONTIN) 300 MG capsule Take 300 mg by mouth 3 (three) times daily.     indapamide (LOZOL) 1.25 MG tablet Take 1 tablet (1.25 mg total) by mouth daily. 90 tablet 1   irbesartan (AVAPRO) 300 MG tablet TAKE 1 TABLET(300 MG) BY MOUTH DAILY 90 tablet 0   nystatin (MYCOSTATIN) 100000 UNIT/ML suspension Take 5 mLs (500,000 Units total) by mouth 4 (four) times daily. 60 mL 2   Oxycodone HCl 10 MG TABS Take 10 mg by mouth 5 (five) times daily as needed (pain.).     sildenafil (REVATIO) 20 MG tablet Take 5 tablets (100 mg total) by mouth as  directed. 60 tablet 3   zinc gluconate 50 MG tablet TAKE 1 TABLET(50 MG) BY MOUTH DAILY 90 tablet 1   fluconazole (DIFLUCAN) 150 MG tablet Take 150 mg by mouth daily. (Patient not taking: Reported on 04/16/2022)     INS SYRINGE/NEEDLE 1CC/28G 28G X 1/2" 1 ML MISC Use to inject b12 monthly 10 each 1   Insulin Pen Needle 32G X 6 MM MISC 1 Act by Does not apply route daily. 100 each 1   meloxicam (MOBIC) 15 MG tablet Take 1 tablet every day by oral route with meal(s). (Patient not taking: Reported on 04/16/2022)     phentermine 37.5 MG capsule Take 1 capsule (37.5 mg total) by mouth every morning. (Patient not taking: Reported on 04/16/2022) 90 capsule 0   tirzepatide (ZEPBOUND) 2.5 MG/0.5ML Pen Inject 2.5 mg into the skin once a week. (Patient not taking: Reported on 04/16/2022) 2 mL 0       Home: Home Living Family/patient expects to be discharged to:: Private residence Living Arrangements: Spouse/significant other Available Help at Discharge: Family, Available 24 hours/day Type of Home: House Home Access: Stairs to enter [employer] of Steps: 4 Entrance Stairs-Rails: Right, Left Home Layout: Multi-level, Able to live on main level with bedroom/bathroom Bathroom Shower/Tub: Multimedia programmer:  Handicapped height Bathroom Accessibility: Yes Home Equipment: Civil engineer, contracting - built in, [place] - single point, Conservation officer, nature (2 wheels)   Functional History: Prior Function Prior Level of Function : Independent/Modified Independent, Driving, Working/employed Mobility Comments: no AD, went to the gym everyday ADLs Comments: indep  Functional Status:  Mobility: Bed Mobility Overal bed mobility: Needs Assistance Bed Mobility: Supine to Sit, Sit to Supine Rolling: Max assist Sidelying to sit: Max assist Supine to sit: Mod assist, HOB elevated Sit to supine: Max assist, +2 for physical assistance, +2 for safety/equipment General bed mobility comments: Increased difficulty maintaining back precautions during sit to supine transition requiring VC to refrain from holding onto bed rail with LUE. Transfers Overall transfer level: Needs assistance Equipment used: 1 person hand held assist Transfers: Sit to/from Stand, Bed to chair/wheelchair/BSC Sit to Stand: Mod assist, +2 safety/equipment Bed to/from chair/wheelchair/BSC transfer type:: Step pivot Stand pivot transfers: Mod assist, +2 physical assistance Step pivot transfers: Mod assist, +2 safety/equipment General transfer comment: assist for power up, rise, steadying, and assist at trunk to transition from [facility] to recliner during suspected orthostatic event Ambulation/Gait Ambulation/Gait assistance: Mod assist, +2 safety/equipment Gait Distance (Feet): 10 Feet Assistive device: 1 person hand held assist Gait Pattern/deviations: Step-through pattern, Decreased stride length, Trunk flexed General Gait Details: assist to steady, +2 for lines/leads. Cues for upright posture, room navigation    ADL: ADL Overall ADL's : Needs assistance/impaired Eating/Feeding: Minimal assistance, Bed level Grooming: Moderate assistance, Bed level Upper Body Bathing: Maximal assistance, Bed level Lower Body Bathing: Total assistance, Bed level Upper Body  Dressing : Total assistance, Bed level Lower Body Dressing: Total assistance, Bed level, +2 for physical assistance Toilet Transfer: Moderate assistance, +2 for safety/equipment, Ambulation, BSC/3in1, Grab bars Toilet Transfer Details (indicate cue type and reason): Pt able to complete functional mobility from bed to [facility] over toilet to attempt to urinate. Grab bar was used to help with stability. Pt very slow moving with all sit<>stand transitions. Toileting- Clothing Manipulation and Hygiene: Total assistance, +2 for physical assistance, Bed level Functional mobility during ADLs: Maximal assistance, +2 for physical assistance, +2 for safety/equipment General ADL Comments: max A to EOB, standing limited by drop in BP  Cognition: Cognition Overall Cognitive Status: Within Functional Limits for tasks assessed Orientation Level: Oriented X4 Cognition Arousal/Alertness: Awake/alert (Sleepy) Behavior During Therapy: WFL for tasks assessed/performed, Flat affect Overall Cognitive Status: Within Functional Limits for tasks assessed General Comments: overall WFL- pt did have intermittent decrease in arousal throughout session which may have been due to orthostatic type behavior and fatigue level.   Blood pressure 138/67, pulse 87, temperature 98.2 F (36.8 C), temperature source Oral, resp. rate 18, height 5' 10"$  (1.778 m), weight 113.4 kg, SpO2 93 %. Physical Exam Vitals and nursing note reviewed.  Constitutional:      General: He is not in acute distress.    Appearance: Normal appearance.  HENT:     Head: Normocephalic and atraumatic.     Right Ear: External ear normal.     Left Ear: External ear normal.     Nose: Nose normal.  Eyes:     Extraocular Movements: Extraocular movements intact.     Pupils: Pupils are equal, round, and reactive to light.  Neck:     Comments: Immobilized with C- collar Cardiovascular:     Rate and Rhythm: Normal rate and regular rhythm.     Pulses: Normal  pulses.     Heart sounds: Murmur heard.     No gallop.  Pulmonary:     Effort: Pulmonary effort is normal. No respiratory distress.     Breath sounds: No wheezing, rhonchi or rales.  Abdominal:     General: Bowel sounds are normal. There is distension.     Tenderness: There is abdominal tenderness (RLQ>LLQ.).     Comments: Decreased bowel sounds  Musculoskeletal:     Comments: Right chest/flank pain with attempts at ROM RLE.  R arm immobilized in splint/sling. RUE 1+ edema, LUE 1+ edema  Skin:    General: Skin is warm.     Comments: Tatoo left arm. Scattered abrasions, bruises. RUE dressed/splinted  Neurological:     Mental Status: He is alert and oriented to person, place, and time.     Comments: Alert and oriented x 3. Normal insight and awareness. Intact Memory. Normal language and speech. Cranial nerve exam unremarkable. RUE limited d/t ortho LUE 4/5. RLE 2/5 prox to 4+/5 distally. LLE 3+/5 prox to 5/5 distally. No focal sensory loss appreciated. No abnl resting tone.   Psychiatric:     Comments: Pt a little flat but generally cooperative and appropriate.      Results for orders placed or performed during the hospital encounter of 04/14/22 (from the past 48 hour(s))  CBC     Status: Abnormal   Collection Time: 04/20/22  3:28 AM  Result Value Ref Range   WBC 5.3 4.0 - 10.5 K/uL   RBC 2.66 (L) 4.22 - 5.81 MIL/uL   Hemoglobin 7.7 (L) 13.0 - 17.0 g/dL   HCT 23.2 (L) 39.0 - 52.0 %   MCV 87.2 80.0 - 100.0 fL   MCH 28.9 26.0 - 34.0 pg   MCHC 33.2 30.0 - 36.0 g/dL   RDW 13.5 11.5 - 15.5 %   Platelets 189 150 - 400 K/uL   nRBC 0.0 0.0 - 0.2 %    Comment: Performed at [facility], [place] [address], [place], [place] [id number]  Basic metabolic panel     Status: Abnormal   Collection Time: 04/20/22  3:28 AM  Result Value Ref Range   Sodium 134 (L) 135 - 145 mmol/L   Potassium 4.3 3.5 - 5.1 mmol/L  Chloride 95 (L) 98 - 111 mmol/L   CO2 30 22 - 32 mmol/L   Glucose, Bld 95 70 -  99 mg/dL    Comment: Glucose reference range applies only to samples taken after fasting for at least 8 hours.   BUN 15 6 - 20 mg/dL   Creatinine, Ser 0.68 0.61 - 1.24 mg/dL   Calcium 8.2 (L) 8.9 - 10.3 mg/dL   GFR, Estimated >60 >60 mL/min    Comment: (NOTE) Calculated using the CKD-EPI Creatinine Equation (2021)    Anion gap 9 5 - 15    Comment: Performed at [facility] [address] [place], [place] [address]   DG Wrist Complete Right  Result Date: 04/21/2022 CLINICAL DATA:  Right wrist pain after a 12 foot fall 04/14/2022. Initial encounter. EXAM: RIGHT WRIST - COMPLETE 3+ VIEW COMPARISON:  None Available. FINDINGS: The patient is in a plaster splint. No acute bony or joint abnormality is identified. Mild to moderate first CMC osteoarthritis noted. IMPRESSION: No acute abnormality. Electronically Signed   By: Inge Rise M.D.   On: 04/21/2022 09:18      Blood pressure 138/67, pulse 87, temperature 98.2 F (36.8 C), temperature source Oral, resp. rate 18, height 5' 10"$  (1.778 m), weight 113.4 kg, SpO2 93 %.  Medical Problem List and Plan: 1. Functional deficits secondary to polytrauma after falling through a ceiling  -patient may shower if RUE is covered and pt can tolerate  -ELOS/Goals: 10-12 days, supervision to mod I with PT, OT.  2.  Antithrombotics: -DVT/anticoagulation:  Pharmaceutical: Lovenox  -antiplatelet therapy: N/A 3. Chronic pain/Pain Management: Tylenol 650 mg qid.  - continueOxycontin 30 mg BID  --using oxycodone 15 mg TID (was on oxycodone 10 mg 5/xday)    -pt told me he was wanting to wean off narcotics prior to this admission  --neuropathic pain: Gabapentin up to 900 mg TID now. Cymbalta 20 mg bid  --also on Skelaxin 800 qid/robaxin 1000 tid-->back spasms controlled on this.  4. Mood/Behavior/Sleep: LCSW to follow for evaluation and support.   --trazodone prn for insomnia.  -antipsychotic agents: N/A 5. Neuropsych/cognition: This patient is  capable of making decisions on his own behalf. 6. Skin/Wound Care: Routine pressure relief measures.  7. Fluids/Electrolytes/Nutrition: Monitor I/O. Check CMET in am 18. C7 compression Fx: C collar at all times per Dr. Annette Stable.  9. Comminuted distal humerus fracture/olecranon Fx/?radial neck Fx:  S/p ORIF humerus-->NWB RUE  10. HTN/ Syncope: Low dose Avapro resumed on 02/16--will set hold parameters. --Monitor orthostatic BP. Add TEDs and abdominal binder.  11. T12-L5 right TP Fx:  TLSO prn 12. H/o gastric bypass: Resume Vitamin B12 IM monthly. Intake at baseline.  13. Abdominal pain/Acute on constipation:  -KUB ordered and demonstrates moderate gas in bowels --Took MOM at daily at night-->"nothing else works"--will resume --pt received mag citrate at 0930 and SMOG enema at 1130-->large results at 1300 today  --continue Miralax TID.  14. Urinary retention: Flomax increased to 0.8 mg today-->change to HS as continues to almost black out at EOB per pt/wife.   --monitor voiding with bladder scans/PVR checks.  15. Soft tissue hematoma/ABLA: H/H with downward trend. Recheck CBC in am  --transfuse prn Hgb<7.0 or if symptomatic.  16. Hyponatremia: Recheck Na level in am.       Bary Leriche, PA-C 04/21/2022

## 2022-04-20 NOTE — PMR Pre-admission (Signed)
PMR Admission Coordinator Pre-Admission Assessment  Patient: Bradley [name] is an 52 y.o., male MRN: [id number] DOB: 1971-01-08 Height: 5' 10"$  (177.8 cm) Weight: 113.4 kg  Insurance Information HMO: yes    PPO:      PCP:      IPA:      80/20:      OTHER:  PRIMARY: [employer] Medicare      Policy#: [id number]      Subscriber: pt CM Name: Ward Chatters      Phone#: [phone]     Fax#: [phone] Pre-Cert#: [id number] [place] for CIR from [place] with [place] with updates due to fax listed above on 2/22      Employer:  Benefits:  Phone #: [phone]     Name:  Eff. Date: 03/06/22     Deduct: $0      Out of Pocket Max: $4500 ($250 met)      Life Max:  CIR: $325/day for days 1-5      SNF: 20 full days Outpatient:      Co-Pay: $20/visit  Home Health: 100%      Co-Pay:  DME: 80%     Co-Pay: 20% Providers:  SECONDARY:       Policy#:      Phone#:   Development worker, community:       Phone#:   The Therapist, art Information Summary" for patients in Inpatient Rehabilitation Facilities with attached "Privacy Act [place] Records" was provided and verbally reviewed with: Patient and Family  Emergency Contact Information Contact Information     Name Relation Home Work [place] [phone]  [phone]   [facility] Daughter   [phone]   [facility] Daughter   [phone]   ELREY, CAPOZZA   [phone]       Current Medical History  Patient Admitting Diagnosis: polytrauma  History of Present Illness: Pt is a 52 y/o male with PMH of anxiety, arthritis, chronic pain, CAD, HTN, gastric bypass in 2008, and R TKA, admitted on 04/14/22 to [facility] after falling through a ceiling.  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  No loc.  In ED required ketamine for pain control, BP 104/67, pulse 72, SpO2 98%.  Labs showed cr 1.33, BUN 22, WBC 12.7.  Trauma workup revealed R rib fx 2-12, small R PTX, R comminuted scapular fx, compressions  fx of C7, T3-5, R humeral fx, R olecranon fx, R radial neck fx, AKI, T12-L5 TP fx, ABLA.  Ortho consulted, all fxs nonop except R humeral fx s/p ORIF on 2/12 per Dr. Griffin Basil.  Neurosurgery consulted and recommended hard collar for compression fractures and pain control for TP fx.  Hospital course pain management, ABLA, shock requiring levophed.  Therapy ongoing and pt was recommended for CIR.     [facility] record [facility] has been reviewed by the rehabilitation admission coordinator and physician.  Past Medical History  Past Medical History:  Diagnosis Date   Allergy    Anxiety    Aortic valve sclerosis    Arthritis    Arthropathy, unspecified, other specified sites    CAD (coronary artery disease)    01/09/20: patient denied; faint coronary calcifications on 2016 CT   Depression    GERD (gastroesophageal reflux disease)    Heart murmur    mild MR, mild AS 06/2019 (Dr. [name])   History of kidney stones    Hypertension     " no longer have hypertension since  gastric bypass "   Obesity, unspecified    Other, mixed, or unspecified nondependent drug abuse, unspecified    Personal history of urinary calculi    Priapism     Has the patient had major surgery during 100 days prior to admission? Yes  Family History   family history includes Arthritis in his mother; Breast cancer in his mother; Colon cancer in his mother; Heart disease in his father; Hypertension in his father; Kidney failure in his mother.  Current Medications  Current Facility-Administered Medications:    acetaminophen (TYLENOL) tablet 1,000 mg, 1,000 mg, Oral, Q6H, McBane, Caroline N, PA-C, 1,000 mg at 04/21/22 0604   bisacodyl (DULCOLAX) suppository 10 mg, 10 mg, Rectal, Once, Lovick, Montel Culver, MD   docusate sodium (COLACE) capsule 100 mg, 100 mg, Oral, BID, McBane, Caroline N, PA-C, 100 mg at 04/21/22 0914   DULoxetine (CYMBALTA) DR capsule 20 mg, 20 mg, Oral, BID, McBane, Caroline N, PA-C, 20 mg at  04/21/22 0919   enoxaparin (LOVENOX) injection 40 mg, 40 mg, Subcutaneous, Q12H, McBane, Caroline N, PA-C, 40 mg at 04/21/22 0913   gabapentin (NEURONTIN) capsule 900 mg, 900 mg, Oral, TID, Jesusita Oka, MD, 900 mg at 04/21/22 [id number]   hydrALAZINE (APRESOLINE) injection 10 mg, 10 mg, Intravenous, Q2H PRN, McBane, Caroline N, PA-C   HYDROmorphone (DILAUDID) injection 0.25-0.5 mg, 0.25-0.5 mg, Intravenous, Q8H PRN, Meuth, Brooke A, PA-C, 0.25 mg at 04/20/22 2339   irbesartan (AVAPRO) tablet 37.5 mg, 37.5 mg, Oral, Daily, Lovick, Ayesha N, MD   lidocaine (LIDODERM) 5 % 2 patch, 2 patch, Transdermal, Daily, McBane, Caroline N, PA-C, 2 patch at 04/21/22 0915   magnesium citrate solution 1 Bottle, 1 Bottle, Oral, Once, Lovick, Montel Culver, MD   metaxalone (SKELAXIN) tablet 800 mg, 800 mg, Oral, QID, McBane, Caroline N, PA-C, 800 mg at 04/21/22 0919   methocarbamol (ROBAXIN) tablet 1,000 mg, 1,000 mg, Oral, Q8H, McBane, Caroline N, PA-C, 1,000 mg at 04/21/22 0604   multivitamin with minerals tablet 1 tablet, 1 tablet, Oral, Daily, McBane, Caroline N, PA-C, 1 tablet at 04/21/22 [id number]   oxyCODONE (Oxy IR/ROXICODONE) immediate release tablet 10-15 mg, 10-15 mg, Oral, Q4H PRN, McBane, Caroline N, PA-C, 15 mg at 04/21/22 [id number]   oxyCODONE (OXYCONTIN) 12 hr tablet 30 mg, 30 mg, Oral, Q12H, Lovick, Montel Culver, MD, 30 mg at 04/21/22 0914   polyethylene glycol (MIRALAX / GLYCOLAX) packet 17 g, 17 g, Oral, TID, Lovick, Montel Culver, MD, 17 g at 04/21/22 0914   sorbitol, milk of mag, mineral oil, glycerin (SMOG) enema, 960 mL, Rectal, Once, Lovick, Montel Culver, MD   tamsulosin (FLOMAX) capsule 0.8 mg, 0.8 mg, Oral, Daily, Lovick, Ayesha N, MD, 0.8 mg at 04/21/22 0913   zinc sulfate capsule 220 mg, 220 mg, Oral, Daily, McBane, Caroline N, PA-C, 220 mg at 04/21/22 0913  Patients Current Diet:  Diet Order             Diet regular Room service appropriate? Yes; Fluid consistency: Thin  Diet effective now                    Precautions / Restrictions Precautions Precautions: Fall, Cervical, Back Precaution Booklet Issued: No Precaution Comments: verbally reviewed back and cervical precautions. TLSO not used during session. Cervical Brace: Hard collar, At all times Spinal Brace: Thoracolumbosacral orthotic (for comfort only; not utilized today) Restrictions Weight Bearing Restrictions: Yes RUE Weight Bearing: Non weight bearing   Has the patient had 2 or more falls  or a fall with injury in the past year? Yes  Prior Activity Level Community (5-7x/wk): indepedent without consistent use of DME, on disability, driving  Prior Functional Level Self Care: Did the patient need help bathing, dressing, using the toilet or eating? Independent  Indoor Mobility: Did the patient need assistance with walking from room to room (with or without device)? Independent  Stairs: Did the patient need assistance with internal or external stairs (with or without device)? Independent  Functional Cognition: Did the patient need help planning regular tasks such as shopping or remembering to take medications? Independent  Patient Information Are you of Hispanic, Latino/a,or Spanish origin?: A. No, not of Hispanic, Latino/a, or Spanish origin What is your race?: A. White Do you need or want an interpreter to communicate with a doctor or health care staff?: 0. No  Patient's Response To:  Health Literacy and Transportation Is the patient able to respond to health literacy and transportation needs?: Yes Health Literacy - How often do you need to have someone help you when you read instructions, pamphlets, or other written material from your doctor or pharmacy?: Never In the past 12 months, has lack of transportation kept you from medical appointments or from getting medications?: No In the past 12 months, has lack of transportation kept you from meetings, work, or from getting things needed for daily living?: No  Water quality scientist / [place] Devices/Equipment: None Home Equipment: Shower seat - built in, [place] - single point, Conservation officer, nature (2 wheels)  Prior Device Use: Indicate devices/aids used by the patient prior to current illness, exacerbation or injury? None of the above  Current Functional Level Cognition  Overall Cognitive Status: Within Functional Limits for tasks assessed Orientation Level: Oriented X4 General Comments: overall WFL- pt did have intermittent decrease in arousal throughout session which may have been due to orthostatic type behavior and fatigue level.    Extremity Assessment (includes Sensation/Coordination)  Upper Extremity Assessment: RUE deficits/detail RUE Deficits / Details: RUE ORIF 2/12. NWB RUE Sensation: WNL RUE Coordination: decreased fine motor, decreased gross motor LUE Deficits / Details: Able to utilize LUE for bed mobility and to assist with sit to stand while holding PT's hand. LUE Sensation: WNL LUE Coordination: decreased gross motor  Lower Extremity Assessment: Defer to PT evaluation    ADLs  Overall ADL's : Needs assistance/impaired Eating/Feeding: Minimal assistance, Bed level Grooming: Moderate assistance, Bed level Upper Body Bathing: Maximal assistance, Bed level Lower Body Bathing: Total assistance, Bed level Upper Body Dressing : Total assistance, Bed level Lower Body Dressing: Total assistance, Bed level, +2 for physical assistance Toilet Transfer: Moderate assistance, +2 for safety/equipment, Ambulation, BSC/3in1, Grab bars Toilet Transfer Details (indicate cue type and reason): Pt able to complete functional mobility from bed to [facility] over toilet to attempt to urinate. Grab bar was used to help with stability. Pt very slow moving with all sit<>stand transitions. Toileting- Clothing Manipulation and Hygiene: Total assistance, +2 for physical assistance, Bed level Functional mobility during ADLs: Maximal assistance, +2 for  physical assistance, +2 for safety/equipment General ADL Comments: max A to EOB, standing limited by drop in BP    Mobility  Overal bed mobility: Needs Assistance Bed Mobility: Supine to Sit, Sit to Supine Rolling: Max assist Sidelying to sit: Max assist Supine to sit: Mod assist, HOB elevated Sit to supine: Max assist, +2 for physical assistance, +2 for safety/equipment General bed mobility comments: Increased difficulty maintaining back precautions during sit to supine transition requiring  VC to refrain from holding onto bed rail with LUE.    Transfers  Overall transfer level: Needs assistance Equipment used: 1 person hand held assist Transfers: Sit to/from Stand, Bed to chair/wheelchair/BSC Sit to Stand: Mod assist, +2 safety/equipment Bed to/from chair/wheelchair/BSC transfer type:: Step pivot Stand pivot transfers: Mod assist, +2 physical assistance Step pivot transfers: Mod assist, +2 safety/equipment General transfer comment: assist for power up, rise, steadying, and assist at trunk to transition from [facility] to recliner during suspected orthostatic event    Ambulation / Gait / Stairs / Wheelchair Mobility  Ambulation/Gait Ambulation/Gait assistance: Mod assist, +2 safety/equipment Gait Distance (Feet): 10 Feet Assistive device: 1 person hand held assist Gait Pattern/deviations: Step-through pattern, Decreased stride length, Trunk flexed General Gait Details: assist to steady, +2 for lines/leads. Cues for upright posture, room navigation    Posture / Balance Dynamic Sitting Balance Sitting balance - Comments: sitting EOB Balance Overall balance assessment: Needs assistance Sitting-balance support: No upper extremity supported, Feet supported Sitting balance-Leahy Scale: Fair Sitting balance - Comments: sitting EOB Standing balance support: Single extremity supported, Reliant on assistive device for balance Standing balance-Leahy Scale: Fair Standing balance comment:  Demonstrated while standing in bathroom at toilet    Special needs/care consideration Oxygen acutely on 2 L 2/2 rib fxs, Skin general abrasions, surgical incision to RUE, and Special service needs pain control   Previous Home Environment (from acute therapy documentation) Living Arrangements: Spouse/significant other Available Help at Discharge: Family, Available 24 hours/day Type of Home: House Home Layout: Multi-level, Able to live on main level with bedroom/bathroom Home Access: Stairs to enter Entrance Stairs-Rails: Right, Left Entrance Stairs-Number of Steps: 4 Bathroom Shower/Tub: Multimedia programmer: Handicapped height Bathroom Accessibility: Yes [place]: No  Discharge Living Setting Plans for Discharge Living Setting: Patient's home, Lives with (comment) (spouse) Type of Home at Discharge: House Discharge Home Layout: Able to live on main level with bedroom/bathroom Discharge Home Access: Stairs to enter Entrance Stairs-Rails: Right, Left Entrance Stairs-Number of Steps: 4 Discharge Bathroom Shower/Tub: Walk-in shower Discharge Bathroom Toilet: Handicapped height Discharge Bathroom Accessibility: Yes How Accessible: Accessible via walker Does the patient have any problems obtaining your medications?: No  Social/Family/Support Systems Anticipated Caregiver: spouse, Eyad Ahlers Anticipated Caregiver's Contact Information: [phone] Ability/Limitations of Caregiver: min assist with mobility, mod assist with ADLs Caregiver Availability: 24/7 Discharge Plan Discussed with Primary Caregiver: Yes Is Caregiver In Agreement with Plan?: Yes Does Caregiver/Family have Issues with Lodging/Transportation while Pt is in Rehab?: No  Goals Patient/Family Goal for Rehab: PT/OT supervision to mod I, SLP n/a Expected length of stay: 10-12 days Additional Information: history of chronic pain, pain management with Dr. Nelva Bush Pt/Family Agrees to Admission and  willing to participate: Yes Program Orientation Provided & Reviewed with Pt/Caregiver Including Roles  & Responsibilities: Yes Additional Information Needs: discharge plan, home with spouse to provide 24/7 assist.  She works from home.  Barriers to Discharge: Insurance for SNF coverage, Home environment access/layout  Decrease burden of Care through IP rehab admission: n/a  Possible need for SNF placement upon discharge: not anticipated.  Plan d/c home with spouse to provide 24/7 supervision/assist.   Patient Condition: I have reviewed medical records from [facility], spoken with CM, and patient and spouse. I met with patient at the bedside for inpatient rehabilitation assessment.  Patient will benefit from ongoing PT and OT, can actively participate in 3 hours of therapy a day 5 days of the week, and can make measurable gains during the  admission.  Patient will also benefit from the coordinated team approach during an Inpatient Acute Rehabilitation admission.  The patient will receive intensive therapy as well as Rehabilitation physician, nursing, social worker, and care management interventions.  Due to safety, skin/wound care, disease management, medication administration, pain management, and patient education the patient requires 24 hour a day rehabilitation nursing.  The patient is currently mod to max assist with mobility and basic ADLs.  Discharge setting and therapy post discharge at home with home health is anticipated.  Patient has agreed to participate in the Acute Inpatient Rehabilitation Program and will admit today.  Preadmission Screen Completed By:  Michel Santee, PT, DPT 04/21/2022 9:46 AM ______________________________________________________________________   Discussed status with Dr. Naaman Plummer on 04/21/22  at 9:46 AM  and received approval for admission today.  Admission Coordinator:  Michel Santee, PT, DPT time 9:46 AM Sudie Grumbling 04/21/22    Assessment/Plan: Diagnosis:  polytrauma Does the need for close, 24 hr/day Medical supervision in concert with the patient's rehab needs make it unreasonable for this patient to be served in a less intensive setting? Yes Co-Morbidities requiring supervision/potential complications: CAD, HTN, depression, pain mgt, ABLA Due to bladder management, bowel management, safety, skin/wound care, disease management, medication administration, pain management, and patient education, does the patient require 24 hr/day rehab nursing? Yes Does the patient require coordinated care of a physician, rehab nurse, PT, OT to address physical and functional deficits in the context of the above medical diagnosis(es)? Yes Addressing deficits in the following areas: balance, endurance, locomotion, strength, transferring, bowel/bladder control, bathing, dressing, feeding, grooming, toileting, and psychosocial support Can the patient actively participate in an intensive therapy program of at least 3 hrs of therapy 5 days a week? Yes The potential for patient to make measurable gains while on inpatient rehab is excellent Anticipated functional outcomes upon discharge from inpatient rehab: modified independent PT, modified independent OT, n/a SLP Estimated rehab length of stay to reach the above functional goals is: 10-12 days Anticipated discharge destination: Home 10. Overall Rehab/Functional Prognosis: excellent   MD Signature: Meredith Staggers, MD, [place] Director Rehabilitation Services 04/21/2022

## 2022-04-20 NOTE — Progress Notes (Addendum)
Inpatient Rehab Admissions Coordinator:   Awaiting determination from [facility] Medicare regarding CIR prior auth request.  Will follow.   1454: Home and community care offered peer to peer.  Dr. Dagoberto Ligas completed and inpatient rehab has been approved.  Discussed in trauma rounds and pt still requiring IV pain medication in addition to oral meds.  Team will attempt to wean in prep for admit to CIR tomorrow.  Will f/u in AM for confirmation.   Shann Medal, PT, DPT Admissions Coordinator [phone] 04/20/22  11:45 AM

## 2022-04-20 NOTE — Progress Notes (Signed)
Occupational Therapy Treatment Patient Details Name: Bradley [name] MRN: [id number] DOB: [birth year] Today's Date: 04/20/2022   History of present illness 52 y.o. male who presents to [facility] on 04/14/2022 after he fell through a roof. Imaging demonstrates multiple injuries:  R rib fxs 2-12, R pneumothorax, R scapula fx and possible L, C7 spinous process fx, R humeral fx, R olecranon fx, possible R radial neck fx, T12-L5 R TP fxs. Pt underwent R humerus ORIF and ulnar nerve neurolysis with anterior transposition on 2/12. PMHx: anxiety, arthritis, CAD, GERD, heart murmur, HTN, gastric bypass, R TKA   OT comments  Pt continues to experience pre-syncopal episodes during therapy session. BP did not drop with positional changes as it did with last session. Unable to take BP while standing for a true orthostatic reading. Pt was able to walk from bed to toilet and attempt to urinate. After standing for ~1 minute, pt experiences body swaying, knee buckling, pale color in face, and decreased responsiveness requiring assistance to safely sit down. Pt continues to be appropriate for CIR to increase functional performance and allow him to complete self care tasks with less difficulty. OT will continue to follow patient acutely.    Recommendations for follow up therapy are one component of a multi-disciplinary discharge planning process, led by the attending physician.  Recommendations may be updated based on patient status, additional functional criteria and insurance authorization.    Follow Up Recommendations  Acute inpatient rehab (3hours/day)     Assistance Recommended at Discharge Frequent or constant Supervision/Assistance  Patient can return home with the following  A lot of help with walking and/or transfers;Two people to help with bathing/dressing/bathroom;Assistance with cooking/housework;Assistance with feeding;Help with stairs or ramp for entrance;Assist for transportation   Equipment  Recommendations  Other (comment) (TBD)       Precautions / Restrictions Precautions Precautions: Fall;Cervical;Back Precaution Booklet Issued: No Required Braces or Orthoses: Cervical Brace;Spinal Brace;Sling;Splint/Cast Cervical Brace: Hard collar;At all times Spinal Brace: Thoracolumbosacral orthotic (for comfort only; not utilized today) Restrictions Weight Bearing Restrictions: Yes RUE Weight Bearing: Non weight bearing       Mobility Bed Mobility Overal bed mobility: Needs Assistance Bed Mobility: Supine to Sit, Sit to Supine     Supine to sit: Mod assist, HOB elevated Sit to supine: Max assist, +2 for physical assistance, +2 for safety/equipment   General bed mobility comments: Increased difficulty maintaining back precautions during sit to supine transition requiring VC to refrain from hold onto bed rail with LUE. Patient Response: Flat affect, Cooperative  Transfers Overall transfer level: Needs assistance Equipment used: 1 person hand held assist Transfers: Sit to/from Stand, Bed to chair/wheelchair/BSC Sit to Stand: Mod assist, +2 safety/equipment     Step pivot transfers: Mod assist, +2 safety/equipment     General transfer comment: pt pulls with LUE through PT hand hold, assist at waist to improve stability when stepping to recliner     Balance Overall balance assessment: Needs assistance Sitting-balance support: No upper extremity supported, Feet supported Sitting balance-Leahy Scale: Fair Sitting balance - Comments: sitting EOB   Standing balance support: Single extremity supported, Reliant on assistive device for balance Standing balance-Leahy Scale: Fair Standing balance comment: Demonstrated while standing in bathroom at toilet             ADL either performed or assessed with clinical judgement   ADL Overall ADL's : Needs assistance/impaired     Toilet Transfer: Moderate assistance;+2 for safety/equipment;Ambulation;BSC/3in1;Grab  bars Toilet Transfer Details (indicate cue type  and reason): Pt able to complete functional mobility from bed to [facility] over toilet to attempt to urinate. Grab bar was used to help with stability. Pt very slow moving with all sit<>stand transitions.          Cognition Arousal/Alertness: Awake/alert (Sleepy) Behavior During Therapy: WFL for tasks assessed/performed, Flat affect Overall Cognitive Status: Within Functional Limits for tasks assessed     General Comments: overall WFL- pt did have intermittent decrease in arousal throughout session which may have been due to orthostatic type behavior and fatigue level.              General Comments Pt on Room Ait upon therapy arrival. BP checked on left arm with 2 different BP cuffs although results were extemely low and suspiciously inaccurate. BP was taken on left calf instead with reading 102/63. HR and SpO2 stable.    Pertinent Vitals/ Pain       Pain Assessment Pain Assessment: Faces Faces Pain Scale: Hurts whole lot Pain Location: RUE, ribs, generalized with movement Pain Descriptors / Indicators: Grimacing, Guarding, Discomfort Pain Intervention(s): Limited activity within patient's tolerance, Monitored during session, Repositioned         Frequency  Min 2X/week        Progress Toward Goals  OT Goals(current goals can now be found in the care plan section)  Progress towards OT goals: Progressing toward goals     Plan Discharge plan needs to be updated;Frequency remains appropriate    Co-evaluation    PT/OT/SLP Co-Evaluation/Treatment: Yes Reason for Co-Treatment: Complexity of the patient's impairments (multi-system involvement);Necessary to address cognition/behavior during functional activity;For patient/therapist safety;To address functional/ADL transfers PT goals addressed during session: Mobility/safety with mobility;Balance        AM-PAC OT "6 Clicks" Daily Activity     Outcome Measure   Help from  another person eating meals?: A Little Help from another person taking care of personal grooming?: A Lot Help from another person toileting, which includes using toliet, bedpan, or urinal?: Total Help from another person bathing (including washing, rinsing, drying)?: Total Help from another person to put on and taking off regular upper body clothing?: Total Help from another person to put on and taking off regular lower body clothing?: Total 6 Click Score: 9    End of Session Equipment Utilized During Treatment: Cervical collar;Other (comment) (UE sling)  OT Visit Diagnosis: Unsteadiness on feet (R26.81);Other abnormalities of gait and mobility (R26.89);Muscle weakness (generalized) (M62.81);Pain Pain - Right/Left: Right Pain - part of body: Arm;Knee   Activity Tolerance Treatment limited secondary to medical complications (Comment) (pre-syncopal episode)   Patient Left in chair;with call bell/phone within reach;with chair alarm set;with family/visitor present   Nurse Communication Mobility status        Time: 1432-1520 OT Time Calculation (min): 48 min  Charges: OT General Charges $OT Visit: 1 Visit OT Treatments $Self Care/Home Management : 8-22 mins  Ailene Ravel, OTR/L,CBIS  Supplemental OT - [facility] and [facility] Secure Chat Preferred    Tulip Meharg, Clarene Duke 04/20/2022, 4:13 PM

## 2022-04-21 ENCOUNTER — Other Ambulatory Visit: Payer: Self-pay

## 2022-04-21 ENCOUNTER — Inpatient Hospital Stay (HOSPITAL_COMMUNITY): Payer: Medicare Other

## 2022-04-21 ENCOUNTER — Inpatient Hospital Stay (HOSPITAL_COMMUNITY)
Admission: RE | Admit: 2022-04-21 | Discharge: 2022-05-02 | DRG: 560 | Disposition: A | Discharge status: Home / Self Care | Payer: Medicare Other | Source: Intra-hospital | Attending: Physical Medicine & Rehabilitation | Admitting: Physical Medicine & Rehabilitation

## 2022-04-21 ENCOUNTER — Encounter (HOSPITAL_COMMUNITY): Payer: Self-pay | Admitting: Orthopaedic Surgery

## 2022-04-21 DIAGNOSIS — I951 Orthostatic hypotension: Secondary | ICD-10-CM | POA: Diagnosis present

## 2022-04-21 DIAGNOSIS — Z8261 Family history of arthritis: Secondary | ICD-10-CM

## 2022-04-21 DIAGNOSIS — K7469 Other cirrhosis of liver: Secondary | ICD-10-CM | POA: Diagnosis present

## 2022-04-21 DIAGNOSIS — S42353S Displaced comminuted fracture of shaft of humerus, unspecified arm, sequela: Secondary | ICD-10-CM | POA: Diagnosis not present

## 2022-04-21 DIAGNOSIS — Z9049 Acquired absence of other specified parts of digestive tract: Secondary | ICD-10-CM

## 2022-04-21 DIAGNOSIS — G4709 Other insomnia: Secondary | ICD-10-CM | POA: Diagnosis not present

## 2022-04-21 DIAGNOSIS — S52023D Displaced fracture of olecranon process without intraarticular extension of unspecified ulna, subsequent encounter for closed fracture with routine healing: Secondary | ICD-10-CM

## 2022-04-21 DIAGNOSIS — W1789XD Other fall from one level to another, subsequent encounter: Secondary | ICD-10-CM

## 2022-04-21 DIAGNOSIS — R109 Unspecified abdominal pain: Secondary | ICD-10-CM | POA: Diagnosis not present

## 2022-04-21 DIAGNOSIS — S12600D Unspecified displaced fracture of seventh cervical vertebra, subsequent encounter for fracture with routine healing: Secondary | ICD-10-CM

## 2022-04-21 DIAGNOSIS — M549 Dorsalgia, unspecified: Secondary | ICD-10-CM | POA: Diagnosis present

## 2022-04-21 DIAGNOSIS — M25521 Pain in right elbow: Secondary | ICD-10-CM | POA: Diagnosis not present

## 2022-04-21 DIAGNOSIS — K5901 Slow transit constipation: Secondary | ICD-10-CM | POA: Diagnosis not present

## 2022-04-21 DIAGNOSIS — S300XXD Contusion of lower back and pelvis, subsequent encounter: Secondary | ICD-10-CM | POA: Diagnosis not present

## 2022-04-21 DIAGNOSIS — E559 Vitamin D deficiency, unspecified: Secondary | ICD-10-CM | POA: Diagnosis present

## 2022-04-21 DIAGNOSIS — T1490XA Injury, unspecified, initial encounter: Secondary | ICD-10-CM | POA: Diagnosis not present

## 2022-04-21 DIAGNOSIS — S42309A Unspecified fracture of shaft of humerus, unspecified arm, initial encounter for closed fracture: Secondary | ICD-10-CM | POA: Insufficient documentation

## 2022-04-21 DIAGNOSIS — F1120 Opioid dependence, uncomplicated: Secondary | ICD-10-CM

## 2022-04-21 DIAGNOSIS — G8911 Acute pain due to trauma: Secondary | ICD-10-CM | POA: Insufficient documentation

## 2022-04-21 DIAGNOSIS — Z981 Arthrodesis status: Secondary | ICD-10-CM

## 2022-04-21 DIAGNOSIS — S12601A Unspecified nondisplaced fracture of seventh cervical vertebra, initial encounter for closed fracture: Secondary | ICD-10-CM | POA: Diagnosis not present

## 2022-04-21 DIAGNOSIS — R7989 Other specified abnormal findings of blood chemistry: Secondary | ICD-10-CM | POA: Diagnosis not present

## 2022-04-21 DIAGNOSIS — Z800 Family history of malignant neoplasm of digestive organs: Secondary | ICD-10-CM

## 2022-04-21 DIAGNOSIS — S42191A Fracture of other part of scapula, right shoulder, initial encounter for closed fracture: Secondary | ICD-10-CM | POA: Diagnosis not present

## 2022-04-21 DIAGNOSIS — K7581 Nonalcoholic steatohepatitis (NASH): Secondary | ICD-10-CM | POA: Diagnosis present

## 2022-04-21 DIAGNOSIS — G4700 Insomnia, unspecified: Secondary | ICD-10-CM | POA: Diagnosis not present

## 2022-04-21 DIAGNOSIS — E871 Hypo-osmolality and hyponatremia: Secondary | ICD-10-CM | POA: Diagnosis present

## 2022-04-21 DIAGNOSIS — W1789XA Other fall from one level to another, initial encounter: Secondary | ICD-10-CM | POA: Diagnosis present

## 2022-04-21 DIAGNOSIS — K5900 Constipation, unspecified: Secondary | ICD-10-CM | POA: Insufficient documentation

## 2022-04-21 DIAGNOSIS — Z841 Family history of disorders of kidney and ureter: Secondary | ICD-10-CM

## 2022-04-21 DIAGNOSIS — N400 Enlarged prostate without lower urinary tract symptoms: Secondary | ICD-10-CM | POA: Diagnosis present

## 2022-04-21 DIAGNOSIS — Y92009 Unspecified place in unspecified non-institutional (private) residence as the place of occurrence of the external cause: Secondary | ICD-10-CM | POA: Diagnosis not present

## 2022-04-21 DIAGNOSIS — M79601 Pain in right arm: Secondary | ICD-10-CM | POA: Diagnosis not present

## 2022-04-21 DIAGNOSIS — S42102A Fracture of unspecified part of scapula, left shoulder, initial encounter for closed fracture: Secondary | ICD-10-CM | POA: Diagnosis not present

## 2022-04-21 DIAGNOSIS — M62830 Muscle spasm of back: Secondary | ICD-10-CM | POA: Diagnosis not present

## 2022-04-21 DIAGNOSIS — D62 Acute posthemorrhagic anemia: Secondary | ICD-10-CM | POA: Diagnosis not present

## 2022-04-21 DIAGNOSIS — S2231XD Fracture of one rib, right side, subsequent encounter for fracture with routine healing: Secondary | ICD-10-CM | POA: Diagnosis not present

## 2022-04-21 DIAGNOSIS — G8929 Other chronic pain: Secondary | ICD-10-CM | POA: Diagnosis present

## 2022-04-21 DIAGNOSIS — R338 Other retention of urine: Secondary | ICD-10-CM | POA: Diagnosis present

## 2022-04-21 DIAGNOSIS — M545 Low back pain, unspecified: Secondary | ICD-10-CM | POA: Diagnosis present

## 2022-04-21 DIAGNOSIS — S2249XA Multiple fractures of ribs, unspecified side, initial encounter for closed fracture: Secondary | ICD-10-CM | POA: Insufficient documentation

## 2022-04-21 DIAGNOSIS — K219 Gastro-esophageal reflux disease without esophagitis: Secondary | ICD-10-CM | POA: Diagnosis present

## 2022-04-21 DIAGNOSIS — S42301D Unspecified fracture of shaft of humerus, right arm, subsequent encounter for fracture with routine healing: Secondary | ICD-10-CM | POA: Diagnosis not present

## 2022-04-21 DIAGNOSIS — S42491S Other displaced fracture of lower end of right humerus, sequela: Secondary | ICD-10-CM | POA: Diagnosis not present

## 2022-04-21 DIAGNOSIS — S42301A Unspecified fracture of shaft of humerus, right arm, initial encounter for closed fracture: Secondary | ICD-10-CM | POA: Diagnosis not present

## 2022-04-21 DIAGNOSIS — Z741 Need for assistance with personal care: Secondary | ICD-10-CM | POA: Diagnosis present

## 2022-04-21 DIAGNOSIS — Z96651 Presence of right artificial knee joint: Secondary | ICD-10-CM | POA: Diagnosis present

## 2022-04-21 DIAGNOSIS — I10 Essential (primary) hypertension: Secondary | ICD-10-CM | POA: Diagnosis present

## 2022-04-21 DIAGNOSIS — S32019D Unspecified fracture of first lumbar vertebra, subsequent encounter for fracture with routine healing: Secondary | ICD-10-CM

## 2022-04-21 DIAGNOSIS — S42109D Fracture of unspecified part of scapula, unspecified shoulder, subsequent encounter for fracture with routine healing: Secondary | ICD-10-CM

## 2022-04-21 DIAGNOSIS — M170 Bilateral primary osteoarthritis of knee: Secondary | ICD-10-CM | POA: Diagnosis present

## 2022-04-21 DIAGNOSIS — Z8249 Family history of ischemic heart disease and other diseases of the circulatory system: Secondary | ICD-10-CM

## 2022-04-21 DIAGNOSIS — G894 Chronic pain syndrome: Secondary | ICD-10-CM

## 2022-04-21 DIAGNOSIS — S43301A Subluxation of unspecified parts of right shoulder girdle, initial encounter: Secondary | ICD-10-CM | POA: Diagnosis not present

## 2022-04-21 DIAGNOSIS — N401 Enlarged prostate with lower urinary tract symptoms: Secondary | ICD-10-CM | POA: Diagnosis present

## 2022-04-21 DIAGNOSIS — S42491A Other displaced fracture of lower end of right humerus, initial encounter for closed fracture: Secondary | ICD-10-CM | POA: Diagnosis not present

## 2022-04-21 DIAGNOSIS — S5401XD Injury of ulnar nerve at forearm level, right arm, subsequent encounter: Secondary | ICD-10-CM

## 2022-04-21 DIAGNOSIS — Z96698 Presence of other orthopedic joint implants: Secondary | ICD-10-CM | POA: Diagnosis present

## 2022-04-21 DIAGNOSIS — S32059D Unspecified fracture of fifth lumbar vertebra, subsequent encounter for fracture with routine healing: Secondary | ICD-10-CM | POA: Diagnosis not present

## 2022-04-21 DIAGNOSIS — S12690S Other displaced fracture of seventh cervical vertebra, sequela: Secondary | ICD-10-CM | POA: Diagnosis not present

## 2022-04-21 DIAGNOSIS — S2241XD Multiple fractures of ribs, right side, subsequent encounter for fracture with routine healing: Secondary | ICD-10-CM

## 2022-04-21 DIAGNOSIS — S52131D Displaced fracture of neck of right radius, subsequent encounter for closed fracture with routine healing: Secondary | ICD-10-CM

## 2022-04-21 DIAGNOSIS — R339 Retention of urine, unspecified: Secondary | ICD-10-CM | POA: Diagnosis not present

## 2022-04-21 DIAGNOSIS — Z9884 Bariatric surgery status: Secondary | ICD-10-CM

## 2022-04-21 DIAGNOSIS — S52021A Displaced fracture of olecranon process without intraarticular extension of right ulna, initial encounter for closed fracture: Secondary | ICD-10-CM | POA: Diagnosis not present

## 2022-04-21 DIAGNOSIS — S2241XA Multiple fractures of ribs, right side, initial encounter for closed fracture: Secondary | ICD-10-CM | POA: Diagnosis not present

## 2022-04-21 DIAGNOSIS — Z885 Allergy status to narcotic agent status: Secondary | ICD-10-CM

## 2022-04-21 DIAGNOSIS — T148XXA Other injury of unspecified body region, initial encounter: Secondary | ICD-10-CM | POA: Diagnosis not present

## 2022-04-21 DIAGNOSIS — M25531 Pain in right wrist: Secondary | ICD-10-CM | POA: Diagnosis not present

## 2022-04-21 DIAGNOSIS — Z886 Allergy status to analgesic agent status: Secondary | ICD-10-CM

## 2022-04-21 DIAGNOSIS — S42401S Unspecified fracture of lower end of right humerus, sequela: Secondary | ICD-10-CM | POA: Diagnosis not present

## 2022-04-21 DIAGNOSIS — Z79899 Other long term (current) drug therapy: Secondary | ICD-10-CM

## 2022-04-21 DIAGNOSIS — E785 Hyperlipidemia, unspecified: Secondary | ICD-10-CM | POA: Diagnosis not present

## 2022-04-21 DIAGNOSIS — S270XXA Traumatic pneumothorax, initial encounter: Secondary | ICD-10-CM | POA: Diagnosis not present

## 2022-04-21 DIAGNOSIS — K5903 Drug induced constipation: Secondary | ICD-10-CM | POA: Diagnosis not present

## 2022-04-21 DIAGNOSIS — Z87442 Personal history of urinary calculi: Secondary | ICD-10-CM

## 2022-04-21 DIAGNOSIS — S300XXA Contusion of lower back and pelvis, initial encounter: Secondary | ICD-10-CM | POA: Insufficient documentation

## 2022-04-21 DIAGNOSIS — Z79891 Long term (current) use of opiate analgesic: Secondary | ICD-10-CM

## 2022-04-21 DIAGNOSIS — Z803 Family history of malignant neoplasm of breast: Secondary | ICD-10-CM

## 2022-04-21 DIAGNOSIS — S42101A Fracture of unspecified part of scapula, right shoulder, initial encounter for closed fracture: Secondary | ICD-10-CM | POA: Diagnosis not present

## 2022-04-21 LAB — CBC WITH DIFFERENTIAL/PLATELET
Abs Immature Granulocytes: 0.04 10*3/uL (ref 0.00–0.07)
Basophils Absolute: 0 10*3/uL (ref 0.0–0.1)
Basophils Relative: 0 %
Eosinophils Absolute: 0.1 10*3/uL (ref 0.0–0.5)
Eosinophils Relative: 1 %
HCT: 22.6 % — ABNORMAL LOW (ref 39.0–52.0)
Hemoglobin: 7.4 g/dL — ABNORMAL LOW (ref 13.0–17.0)
Immature Granulocytes: 1 %
Lymphocytes Relative: 22 %
Lymphs Abs: 1.2 10*3/uL (ref 0.7–4.0)
MCH: 28.8 pg (ref 26.0–34.0)
MCHC: 32.7 g/dL (ref 30.0–36.0)
MCV: 87.9 fL (ref 80.0–100.0)
Monocytes Absolute: 0.5 10*3/uL (ref 0.1–1.0)
Monocytes Relative: 10 %
Neutro Abs: 3.5 10*3/uL (ref 1.7–7.7)
Neutrophils Relative %: 66 %
Platelets: 232 10*3/uL (ref 150–400)
RBC: 2.57 MIL/uL — ABNORMAL LOW (ref 4.22–5.81)
RDW: 13.7 % (ref 11.5–15.5)
WBC: 5.3 10*3/uL (ref 4.0–10.5)
nRBC: 0 % (ref 0.0–0.2)

## 2022-04-21 LAB — COMPREHENSIVE METABOLIC PANEL
ALT: 31 U/L (ref 0–44)
AST: 44 U/L — ABNORMAL HIGH (ref 15–41)
Albumin: 2.2 g/dL — ABNORMAL LOW (ref 3.5–5.0)
Alkaline Phosphatase: 61 U/L (ref 38–126)
Anion gap: 9 (ref 5–15)
BUN: 21 mg/dL — ABNORMAL HIGH (ref 6–20)
CO2: 27 mmol/L (ref 22–32)
Calcium: 8.1 mg/dL — ABNORMAL LOW (ref 8.9–10.3)
Chloride: 98 mmol/L (ref 98–111)
Creatinine, Ser: 0.91 mg/dL (ref 0.61–1.24)
GFR, Estimated: >60 mL/min (ref >60)
Glucose, Bld: 127 mg/dL — ABNORMAL HIGH (ref 70–99)
Potassium: 4.5 mmol/L (ref 3.5–5.1)
Sodium: 134 mmol/L — ABNORMAL LOW (ref 135–145)
Total Bilirubin: 0.7 mg/dL (ref 0.3–1.2)
Total Protein: 5 g/dL — ABNORMAL LOW (ref 6.5–8.1)

## 2022-04-21 MED ORDER — IRBESARTAN 75 MG PO TABS
37.5000 mg | ORAL_TABLET | Freq: Every day | ORAL | Status: DC
  Administered 2022-04-21: 37.5 mg via ORAL
  Filled 2022-04-21: qty 1

## 2022-04-21 MED ORDER — LIDOCAINE 5 % EX PTCH
2.0000 | MEDICATED_PATCH | Freq: Every day | CUTANEOUS | Status: DC
  Administered 2022-04-22 – 2022-05-02 (×11): 2 via TRANSDERMAL
  Filled 2022-04-21 (×11): qty 2

## 2022-04-21 MED ORDER — PROCHLORPERAZINE EDISYLATE 10 MG/2ML IJ SOLN: 5.0000 mg | Freq: Four times a day (QID) | INTRAMUSCULAR | Status: DC | PRN

## 2022-04-21 MED ORDER — IRBESARTAN 75 MG PO TABS
37.5000 mg | ORAL_TABLET | Freq: Every day | ORAL | Status: DC
  Administered 2022-04-22 – 2022-04-26 (×3): 37.5 mg via ORAL
  Filled 2022-04-21 (×5): qty 0.5

## 2022-04-21 MED ORDER — PROCHLORPERAZINE 25 MG RE SUPP: 12.5000 mg | Freq: Four times a day (QID) | RECTAL | Status: DC | PRN

## 2022-04-21 MED ORDER — OXYCODONE HCL ER 15 MG PO T12A
30.0000 mg | EXTENDED_RELEASE_TABLET | Freq: Two times a day (BID) | ORAL | Status: DC
  Administered 2022-04-21 – 2022-04-28 (×14): 30 mg via ORAL
  Filled 2022-04-21 (×15): qty 2

## 2022-04-21 MED ORDER — METAXALONE 800 MG PO TABS
800.0000 mg | ORAL_TABLET | Freq: Four times a day (QID) | ORAL | Status: DC
  Administered 2022-04-21 – 2022-04-25 (×16): 800 mg via ORAL
  Filled 2022-04-21 (×19): qty 1

## 2022-04-21 MED ORDER — IRBESARTAN 75 MG PO TABS: 75.0000 mg | ORAL_TABLET | Freq: Every day | ORAL | Status: DC

## 2022-04-21 MED ORDER — GABAPENTIN 300 MG PO CAPS
900.0000 mg | ORAL_CAPSULE | Freq: Three times a day (TID) | ORAL | Status: DC
  Administered 2022-04-21 – 2022-05-02 (×33): 900 mg via ORAL
  Filled 2022-04-21 (×33): qty 3

## 2022-04-21 MED ORDER — BISACODYL 10 MG RE SUPP: 10.0000 mg | Freq: Once | RECTAL | Status: DC

## 2022-04-21 MED ORDER — LIDOCAINE HCL URETHRAL/MUCOSAL 2 % EX GEL: CUTANEOUS | Status: DC | PRN

## 2022-04-21 MED ORDER — TRAZODONE HCL 50 MG PO TABS
25.0000 mg | ORAL_TABLET | Freq: Every evening | ORAL | Status: DC | PRN
  Administered 2022-04-21: 50 mg via ORAL
  Filled 2022-04-21: qty 1

## 2022-04-21 MED ORDER — DOCUSATE SODIUM 100 MG PO CAPS
100.0000 mg | ORAL_CAPSULE | Freq: Two times a day (BID) | ORAL | Status: DC
  Administered 2022-04-21 – 2022-05-02 (×22): 100 mg via ORAL
  Filled 2022-04-21 (×22): qty 1

## 2022-04-21 MED ORDER — POLYETHYLENE GLYCOL 3350 17 G PO PACK
17.0000 g | PACK | Freq: Three times a day (TID) | ORAL | Status: DC
  Administered 2022-04-21: 17 g via ORAL
  Filled 2022-04-21: qty 1

## 2022-04-21 MED ORDER — SENNA 8.6 MG PO TABS
2.0000 | ORAL_TABLET | Freq: Once | ORAL | Status: AC
  Administered 2022-04-21: 17.2 mg via ORAL
  Filled 2022-04-21: qty 2

## 2022-04-21 MED ORDER — POLYETHYLENE GLYCOL 3350 17 G PO PACK
17.0000 g | PACK | Freq: Every day | ORAL | Status: DC | PRN
  Filled 2022-04-21: qty 1

## 2022-04-21 MED ORDER — PROCHLORPERAZINE MALEATE 5 MG PO TABS: 5.0000 mg | ORAL_TABLET | Freq: Four times a day (QID) | ORAL | Status: DC | PRN

## 2022-04-21 MED ORDER — ALUM & MAG HYDROXIDE-SIMETH 200-200-20 MG/5ML PO SUSP: 30.0000 mL | ORAL | Status: DC | PRN

## 2022-04-21 MED ORDER — TAMSULOSIN HCL 0.4 MG PO CAPS
0.8000 mg | ORAL_CAPSULE | Freq: Every day | ORAL | Status: DC
  Administered 2022-04-22 – 2022-05-01 (×10): 0.8 mg via ORAL
  Filled 2022-04-21 (×10): qty 2

## 2022-04-21 MED ORDER — ENOXAPARIN SODIUM 30 MG/0.3ML IJ SOSY
30.0000 mg | PREFILLED_SYRINGE | Freq: Two times a day (BID) | INTRAMUSCULAR | Status: DC
  Administered 2022-04-21 – 2022-05-02 (×22): 30 mg via SUBCUTANEOUS
  Filled 2022-04-21 (×22): qty 0.3

## 2022-04-21 MED ORDER — ZINC SULFATE 220 (50 ZN) MG PO CAPS
220.0000 mg | ORAL_CAPSULE | Freq: Every day | ORAL | Status: DC
  Administered 2022-04-22 – 2022-05-02 (×11): 220 mg via ORAL
  Filled 2022-04-21 (×12): qty 1

## 2022-04-21 MED ORDER — POLYETHYLENE GLYCOL 3350 17 G PO PACK
17.0000 g | PACK | Freq: Three times a day (TID) | ORAL | Status: DC
  Administered 2022-04-21 – 2022-04-27 (×8): 17 g via ORAL
  Filled 2022-04-21 (×19): qty 1

## 2022-04-21 MED ORDER — OXYCODONE HCL 5 MG PO TABS
10.0000 mg | ORAL_TABLET | ORAL | Status: DC | PRN
  Administered 2022-04-21 – 2022-04-22 (×4): 15 mg via ORAL
  Filled 2022-04-21 (×4): qty 3

## 2022-04-21 MED ORDER — METHOCARBAMOL 500 MG PO TABS
1000.0000 mg | ORAL_TABLET | Freq: Three times a day (TID) | ORAL | Status: DC
  Administered 2022-04-21 – 2022-04-25 (×12): 1000 mg via ORAL
  Filled 2022-04-21 (×12): qty 2

## 2022-04-21 MED ORDER — DIPHENHYDRAMINE HCL 12.5 MG/5ML PO ELIX
12.5000 mg | ORAL_SOLUTION | Freq: Four times a day (QID) | ORAL | Status: DC | PRN
  Administered 2022-04-25 – 2022-04-30 (×6): 25 mg via ORAL
  Filled 2022-04-21 (×7): qty 10

## 2022-04-21 MED ORDER — SORBITOL 70 % SOLN
960.0000 mL | TOPICAL_OIL | Freq: Once | ORAL | Status: AC
  Administered 2022-04-21: 960 mL via RECTAL
  Filled 2022-04-21: qty 240

## 2022-04-21 MED ORDER — MAGNESIUM HYDROXIDE 400 MG/5ML PO SUSP
30.0000 mL | Freq: Once | ORAL | Status: AC
  Administered 2022-04-21: 30 mL via ORAL
  Filled 2022-04-21: qty 30

## 2022-04-21 MED ORDER — BISACODYL 5 MG PO TBEC
10.0000 mg | DELAYED_RELEASE_TABLET | Freq: Once | ORAL | Status: AC
  Administered 2022-04-21: 10 mg via ORAL
  Filled 2022-04-21: qty 2

## 2022-04-21 MED ORDER — ACETAMINOPHEN 325 MG PO TABS
650.0000 mg | ORAL_TABLET | Freq: Four times a day (QID) | ORAL | Status: DC
  Administered 2022-04-21 – 2022-05-02 (×43): 650 mg via ORAL
  Filled 2022-04-21 (×43): qty 2

## 2022-04-21 MED ORDER — FLEET ENEMA 7-19 GM/118ML RE ENEM: 1.0000 | ENEMA | Freq: Once | RECTAL | Status: DC | PRN

## 2022-04-21 MED ORDER — GUAIFENESIN-DM 100-10 MG/5ML PO SYRP: 5.0000 mL | ORAL_SOLUTION | Freq: Four times a day (QID) | ORAL | Status: DC | PRN

## 2022-04-21 MED ORDER — ADULT MULTIVITAMIN W/MINERALS CH
1.0000 | ORAL_TABLET | Freq: Every day | ORAL | Status: DC
  Administered 2022-04-22 – 2022-05-02 (×11): 1 via ORAL
  Filled 2022-04-21 (×11): qty 1

## 2022-04-21 MED ORDER — CYANOCOBALAMIN 1000 MCG/ML IJ SOLN
1000.0000 ug | INTRAMUSCULAR | Status: DC
  Administered 2022-04-21: 1000 ug via INTRAMUSCULAR
  Filled 2022-04-21: qty 1

## 2022-04-21 MED ORDER — MAGNESIUM HYDROXIDE 400 MG/5ML PO SUSP
30.0000 mL | Freq: Every day | ORAL | Status: DC
  Administered 2022-04-22 – 2022-05-01 (×6): 30 mL via ORAL
  Filled 2022-04-21 (×11): qty 30

## 2022-04-21 MED ORDER — TAMSULOSIN HCL 0.4 MG PO CAPS
0.8000 mg | ORAL_CAPSULE | Freq: Every day | ORAL | Status: DC
  Administered 2022-04-21: 0.8 mg via ORAL
  Filled 2022-04-21: qty 2

## 2022-04-21 MED ORDER — MAGNESIUM CITRATE PO SOLN
1.0000 | Freq: Once | ORAL | Status: AC
  Administered 2022-04-21: 1 via ORAL
  Filled 2022-04-21: qty 296

## 2022-04-21 MED ORDER — DULOXETINE HCL 20 MG PO CPEP
20.0000 mg | ORAL_CAPSULE | Freq: Two times a day (BID) | ORAL | Status: DC
  Administered 2022-04-21 – 2022-05-02 (×22): 20 mg via ORAL
  Filled 2022-04-21 (×22): qty 1

## 2022-04-21 MED ORDER — ACETAMINOPHEN 325 MG PO TABS: 325.0000 mg | ORAL_TABLET | ORAL | Status: DC | PRN

## 2022-04-21 MED ORDER — BISACODYL 10 MG RE SUPP: 10.0000 mg | Freq: Every day | RECTAL | Status: DC | PRN

## 2022-04-21 NOTE — Progress Notes (Signed)
2/16 Pt will discharge today, I spoke to daughter [place]) via telephone. The IMM Letter was acknowledged and will be mailed to patient's address on file.

## 2022-04-21 NOTE — Progress Notes (Signed)
PMR Admission Coordinator Pre-Admission Assessment   Patient: Bradley [name] is an 52 y.o., male MRN: [id number] DOB: 1970-05-14 Height: 5' 10"$  (177.8 cm) Weight: 113.4 kg   Insurance Information HMO: yes    PPO:      PCP:      IPA:      80/20:      OTHER:  PRIMARY: U[employer] Medicare      Policy#: [id number]      Subscriber: pt CM Name: Ward Chatters      Phone#: [phone]     Fax#: [phone] Pre-Cert#: [id number] [place] for CIR from [place] with [facility] with updates due to fax listed above on 2/22      Employer:  Benefits:  Phone #: [phone]     Name:  Eff. Date: 03/06/22     Deduct: $0      Out of Pocket Max: $4500 ($250 met)      Life Max:  CIR: $325/day for days 1-5      SNF: 20 full days Outpatient:      Co-Pay: $20/visit  Home Health: 100%      Co-Pay:  DME: 80%     Co-Pay: 20% Providers:  SECONDARY:       Policy#:      Phone#:    Development worker, community:       Phone#:    The Therapist, art Information Summary" for patients in Inpatient Rehabilitation Facilities with attached "Privacy Act [place] Records" was provided and verbally reviewed with: Patient and Family   Emergency Contact Information Contact Information       Name Relation Home Work [place] [phone]   [phone]    [facility] Daughter     [phone]    [facility] Daughter     [phone]    RIOTT, FULLER     [phone]           Current Medical History  Patient Admitting Diagnosis: polytrauma   History of Present Illness: Pt is a 52 y/o male with PMH of anxiety, arthritis, chronic pain, CAD, HTN, gastric bypass in 2008, and R TKA, admitted on 04/14/22 to [facility] after falling through a ceiling.  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  No loc.  In ED required ketamine for pain control, BP 104/67, pulse 72, SpO2 98%.  Labs showed cr 1.33, BUN 22, WBC 12.7.  Trauma workup revealed R rib fx 2-12, small R PTX, R comminuted  scapular fx, compressions fx of C7, T3-5, R humeral fx, R olecranon fx, R radial neck fx, AKI, T12-L5 TP fx, ABLA.  Ortho consulted, all fxs nonop except R humeral fx s/p ORIF on 2/12 per Dr. Griffin Basil.  Neurosurgery consulted and recommended hard collar for compression fractures and pain control for TP fx.  Hospital course pain management, ABLA, shock requiring levophed.  Therapy ongoing and pt was recommended for CIR.    [facility] record from [facility] has been reviewed by the rehabilitation admission coordinator and physician.   Past Medical History      Past Medical History:  Diagnosis Date   Allergy     Anxiety     Aortic valve sclerosis     Arthritis     Arthropathy, unspecified, other specified sites     CAD (coronary artery disease)      01/09/20: patient denied; faint coronary calcifications on 2016 CT   Depression     GERD (gastroesophageal reflux disease)  Heart murmur      mild MR, mild AS 06/2019 (Dr. [name])   History of kidney stones     Hypertension       " no longer have hypertension since gastric bypass "   Obesity, unspecified     Other, mixed, or unspecified nondependent drug abuse, unspecified     Personal history of urinary calculi     Priapism        Has the patient had major surgery during 100 days prior to admission? Yes   Family History   family history includes Arthritis in his mother; Breast cancer in his mother; Colon cancer in his mother; Heart disease in his father; Hypertension in his father; Kidney failure in his mother.   Current Medications   Current Facility-Administered Medications:    acetaminophen (TYLENOL) tablet 1,000 mg, 1,000 mg, Oral, Q6H, McBane, Caroline N, PA-C, 1,000 mg at 04/21/22 0604   bisacodyl (DULCOLAX) suppository 10 mg, 10 mg, Rectal, Once, Lovick, Montel Culver, MD   docusate sodium (COLACE) capsule 100 mg, 100 mg, Oral, BID, McBane, Caroline N, PA-C, 100 mg at 04/21/22 0914   DULoxetine (CYMBALTA) DR capsule 20 mg,  20 mg, Oral, BID, McBane, Caroline N, PA-C, 20 mg at 04/21/22 0919   enoxaparin (LOVENOX) injection 40 mg, 40 mg, Subcutaneous, Q12H, McBane, Caroline N, PA-C, 40 mg at 04/21/22 0913   gabapentin (NEURONTIN) capsule 900 mg, 900 mg, Oral, TID, Jesusita Oka, MD, 900 mg at 04/21/22 [id number]   hydrALAZINE (APRESOLINE) injection 10 mg, 10 mg, Intravenous, Q2H PRN, McBane, Caroline N, PA-C   HYDROmorphone (DILAUDID) injection 0.25-0.5 mg, 0.25-0.5 mg, Intravenous, Q8H PRN, Meuth, Brooke A, PA-C, 0.25 mg at 04/20/22 2339   irbesartan (AVAPRO) tablet 37.5 mg, 37.5 mg, Oral, Daily, Lovick, Ayesha N, MD   lidocaine (LIDODERM) 5 % 2 patch, 2 patch, Transdermal, Daily, McBane, Caroline N, PA-C, 2 patch at 04/21/22 0915   magnesium citrate solution 1 Bottle, 1 Bottle, Oral, Once, Lovick, Montel Culver, MD   metaxalone (SKELAXIN) tablet 800 mg, 800 mg, Oral, QID, McBane, Caroline N, PA-C, 800 mg at 04/21/22 0919   methocarbamol (ROBAXIN) tablet 1,000 mg, 1,000 mg, Oral, Q8H, McBane, Caroline N, PA-C, 1,000 mg at 04/21/22 0604   multivitamin with minerals tablet 1 tablet, 1 tablet, Oral, Daily, McBane, Caroline N, PA-C, 1 tablet at 04/21/22 [id number]   oxyCODONE (Oxy IR/ROXICODONE) immediate release tablet 10-15 mg, 10-15 mg, Oral, Q4H PRN, McBane, Caroline N, PA-C, 15 mg at 04/21/22 [id number]   oxyCODONE (OXYCONTIN) 12 hr tablet 30 mg, 30 mg, Oral, Q12H, Lovick, Montel Culver, MD, 30 mg at 04/21/22 0914   polyethylene glycol (MIRALAX / GLYCOLAX) packet 17 g, 17 g, Oral, TID, Lovick, Montel Culver, MD, 17 g at 04/21/22 0914   sorbitol, milk of mag, mineral oil, glycerin (SMOG) enema, 960 mL, Rectal, Once, Lovick, Montel Culver, MD   tamsulosin (FLOMAX) capsule 0.8 mg, 0.8 mg, Oral, Daily, Lovick, Ayesha N, MD, 0.8 mg at 04/21/22 0913   zinc sulfate capsule 220 mg, 220 mg, Oral, Daily, McBane, Caroline N, PA-C, 220 mg at 04/21/22 0913   Patients Current Diet:  Diet Order                  Diet regular Room service appropriate? Yes; Fluid  consistency: Thin  Diet effective now                         Precautions / Restrictions Precautions  Precautions: Fall, Cervical, Back Precaution Booklet Issued: No Precaution Comments: verbally reviewed back and cervical precautions. TLSO not used during session. Cervical Brace: Hard collar, At all times Spinal Brace: Thoracolumbosacral orthotic (for comfort only; not utilized today) Restrictions Weight Bearing Restrictions: Yes RUE Weight Bearing: Non weight bearing    Has the patient had 2 or more falls or a fall with injury in the past year? Yes   Prior Activity Level Community (5-7x/wk): indepedent without consistent use of DME, on disability, driving   Prior Functional Level Self Care: Did the patient need help bathing, dressing, using the toilet or eating? Independent   Indoor Mobility: Did the patient need assistance with walking from room to room (with or without device)? Independent   Stairs: Did the patient need assistance with internal or external stairs (with or without device)? Independent   Functional Cognition: Did the patient need help planning regular tasks such as shopping or remembering to take medications? Independent   Patient Information Are you of Hispanic, Latino/a,or Spanish origin?: A. No, not of Hispanic, Latino/a, or Spanish origin What is your race?: A. White Do you need or want an interpreter to communicate with a doctor or health care staff?: 0. No   Patient's Response To:  Health Literacy and Transportation Is the patient able to respond to health literacy and transportation needs?: Yes Health Literacy - How often do you need to have someone help you when you read instructions, pamphlets, or other written material from your doctor or pharmacy?: Never In the past 12 months, has lack of transportation kept you from medical appointments or from getting medications?: No In the past 12 months, has lack of transportation kept you from meetings,  work, or from getting things needed for daily living?: No   Development worker, international aid / [place] Devices/Equipment: None Home Equipment: Shower seat - built in, [place] - single point, Conservation officer, nature (2 wheels)   Prior Device Use: Indicate devices/aids used by the patient prior to current illness, exacerbation or injury? None of the above   Current Functional Level Cognition   Overall Cognitive Status: Within Functional Limits for tasks assessed Orientation Level: Oriented X4 General Comments: overall WFL- pt did have intermittent decrease in arousal throughout session which may have been due to orthostatic type behavior and fatigue level.    Extremity Assessment (includes Sensation/Coordination)   Upper Extremity Assessment: RUE deficits/detail RUE Deficits / Details: RUE ORIF 2/12. NWB RUE Sensation: WNL RUE Coordination: decreased fine motor, decreased gross motor LUE Deficits / Details: Able to utilize LUE for bed mobility and to assist with sit to stand while holding PT's hand. LUE Sensation: WNL LUE Coordination: decreased gross motor  Lower Extremity Assessment: Defer to PT evaluation     ADLs   Overall ADL's : Needs assistance/impaired Eating/Feeding: Minimal assistance, Bed level Grooming: Moderate assistance, Bed level Upper Body Bathing: Maximal assistance, Bed level Lower Body Bathing: Total assistance, Bed level Upper Body Dressing : Total assistance, Bed level Lower Body Dressing: Total assistance, Bed level, +2 for physical assistance Toilet Transfer: Moderate assistance, +2 for safety/equipment, Ambulation, BSC/3in1, Grab bars Toilet Transfer Details (indicate cue type and reason): Pt able to complete functional mobility from bed to [facility] over toilet to attempt to urinate. Grab bar was used to help with stability. Pt very slow moving with all sit<>stand transitions. Toileting- Clothing Manipulation and Hygiene: Total assistance, +2 for physical assistance,  Bed level Functional mobility during ADLs: Maximal assistance, +2 for physical assistance, +2 for  safety/equipment General ADL Comments: max A to EOB, standing limited by drop in BP     Mobility   Overal bed mobility: Needs Assistance Bed Mobility: Supine to Sit, Sit to Supine Rolling: Max assist Sidelying to sit: Max assist Supine to sit: Mod assist, HOB elevated Sit to supine: Max assist, +2 for physical assistance, +2 for safety/equipment General bed mobility comments: Increased difficulty maintaining back precautions during sit to supine transition requiring VC to refrain from holding onto bed rail with LUE.     Transfers   Overall transfer level: Needs assistance Equipment used: 1 person hand held assist Transfers: Sit to/from Stand, Bed to chair/wheelchair/BSC Sit to Stand: Mod assist, +2 safety/equipment Bed to/from chair/wheelchair/BSC transfer type:: Step pivot Stand pivot transfers: Mod assist, +2 physical assistance Step pivot transfers: Mod assist, +2 safety/equipment General transfer comment: assist for power up, rise, steadying, and assist at trunk to transition from [facility] to recliner during suspected orthostatic event     Ambulation / Gait / Stairs / Wheelchair Mobility   Ambulation/Gait Ambulation/Gait assistance: Mod assist, +2 safety/equipment Gait Distance (Feet): 10 Feet Assistive device: 1 person hand held assist Gait Pattern/deviations: Step-through pattern, Decreased stride length, Trunk flexed General Gait Details: assist to steady, +2 for lines/leads. Cues for upright posture, room navigation     Posture / Balance Dynamic Sitting Balance Sitting balance - Comments: sitting EOB Balance Overall balance assessment: Needs assistance Sitting-balance support: No upper extremity supported, Feet supported Sitting balance-Leahy Scale: Fair Sitting balance - Comments: sitting EOB Standing balance support: Single extremity supported, Reliant on assistive device for  balance Standing balance-Leahy Scale: Fair Standing balance comment: Demonstrated while standing in bathroom at toilet     Special needs/care consideration Oxygen acutely on 2 L 2/2 rib fxs, Skin general abrasions, surgical incision to RUE, and Special service needs pain control    Previous Home Environment (from acute therapy documentation) Living Arrangements: Spouse/significant other Available Help at Discharge: Family, Available 24 hours/day Type of Home: House Home Layout: Multi-level, Able to live on main level with bedroom/bathroom Home Access: Stairs to enter Entrance Stairs-Rails: Right, Left Entrance Stairs-Number of Steps: 4 Bathroom Shower/Tub: Multimedia programmer: Handicapped height Bathroom Accessibility: Yes Gravity: No   Discharge Living Setting Plans for Discharge Living Setting: Patient's home, Lives with (comment) (spouse) Type of Home at Discharge: House Discharge Home Layout: Able to live on main level with bedroom/bathroom Discharge Home Access: Stairs to enter Entrance Stairs-Rails: Right, Left Entrance Stairs-Number of Steps: 4 Discharge Bathroom Shower/Tub: Walk-in shower Discharge Bathroom Toilet: Handicapped height Discharge Bathroom Accessibility: Yes How Accessible: Accessible via walker Does the patient have any problems obtaining your medications?: No   Social/Family/Support Systems Anticipated Caregiver: spouse, Tabor Trussell Anticipated Caregiver's Contact Information: [phone] Ability/Limitations of Caregiver: min assist with mobility, mod assist with ADLs Caregiver Availability: 24/7 Discharge Plan Discussed with Primary Caregiver: Yes Is Caregiver In Agreement with Plan?: Yes Does Caregiver/Family have Issues with Lodging/Transportation while Pt is in Rehab?: No   Goals Patient/Family Goal for Rehab: PT/OT supervision to mod I, SLP n/a Expected length of stay: 10-12 days Additional Information: history of  chronic pain, pain management with Dr. Nelva Bush Pt/Family Agrees to Admission and willing to participate: Yes Program Orientation Provided & Reviewed with Pt/Caregiver Including Roles  & Responsibilities: Yes Additional Information Needs: discharge plan, home with spouse to provide 24/7 assist.  She works from home.  Barriers to Discharge: Insurance for SNF coverage, Home environment access/layout   Decrease burden of  Care through IP rehab admission: n/a   Possible need for SNF placement upon discharge: not anticipated.  Plan d/c home with spouse to provide 24/7 supervision/assist.    Patient Condition: I have reviewed medical records from [facility], spoken with CM, and patient and spouse. I met with patient at the bedside for inpatient rehabilitation assessment.  Patient will benefit from ongoing PT and OT, can actively participate in 3 hours of therapy a day 5 days of the week, and can make measurable gains during the admission.  Patient will also benefit from the coordinated team approach during an Inpatient Acute Rehabilitation admission.  The patient will receive intensive therapy as well as Rehabilitation physician, nursing, social worker, and care management interventions.  Due to safety, skin/wound care, disease management, medication administration, pain management, and patient education the patient requires 24 hour a day rehabilitation nursing.  The patient is currently mod to max assist with mobility and basic ADLs.  Discharge setting and therapy post discharge at home with home health is anticipated.  Patient has agreed to participate in the Acute Inpatient Rehabilitation Program and will admit today.   Preadmission Screen Completed By:  Michel Santee, PT, DPT 04/21/2022 9:46 AM ______________________________________________________________________   Discussed status with Dr. Naaman Plummer on 04/21/22  at 9:46 AM  and received approval for admission today.   Admission Coordinator:  Michel Santee, PT, DPT time 9:46 AM Sudie Grumbling 04/21/22     Assessment/Plan: Diagnosis: polytrauma Does the need for close, 24 hr/day Medical supervision in concert with the patient's rehab needs make it unreasonable for this patient to be served in a less intensive setting? Yes Co-Morbidities requiring supervision/potential complications: CAD, HTN, depression, pain mgt, ABLA Due to bladder management, bowel management, safety, skin/wound care, disease management, medication administration, pain management, and patient education, does the patient require 24 hr/day rehab nursing? Yes Does the patient require coordinated care of a physician, rehab nurse, PT, OT to address physical and functional deficits in the context of the above medical diagnosis(es)? Yes Addressing deficits in the following areas: balance, endurance, locomotion, strength, transferring, bowel/bladder control, bathing, dressing, feeding, grooming, toileting, and psychosocial support Can the patient actively participate in an intensive therapy program of at least 3 hrs of therapy 5 days a week? Yes The potential for patient to make measurable gains while on inpatient rehab is excellent Anticipated functional outcomes upon discharge from inpatient rehab: modified independent PT, modified independent OT, n/a SLP Estimated rehab length of stay to reach the above functional goals is: 10-12 days Anticipated discharge destination: Home 10. Overall Rehab/Functional Prognosis: excellent     MD Signature: Meredith Staggers, MD, [place] Director Rehabilitation Services 04/21/2022

## 2022-04-21 NOTE — Progress Notes (Signed)
Inpatient Rehab Admissions Coordinator:    I have insurance approval and a bed available for pt to admit to CIR today. Dr. Bobbye Morton in agreement.  Will let pt/family and TOC team know.   Shann Medal, PT, DPT Admissions Coordinator [phone] 04/21/22  9:43 AM

## 2022-04-21 NOTE — Progress Notes (Signed)
   Trauma/Critical Care Follow Up Note  Subjective:    Overnight Issues:   Objective:  Vital signs for last 24 hours: Temp:  [98.3 F (36.8 C)-98.7 F (37.1 C)] 98.7 F (37.1 C) (02/16 0746) Pulse Rate:  [78-97] 82 (02/16 0746) Resp:  [10-19] 15 (02/16 0746) BP: (74-131)/(42-70) 97/49 (02/16 0746) SpO2:  [90 %-99 %] 97 % (02/16 0746)  Hemodynamic parameters for last 24 hours:    Intake/Output from previous day: 02/15 0701 - 02/16 0700 In: 1320 [P.O.:1320] Out: 1350 [Urine:1350]  Intake/Output this shift: No intake/output data recorded.  Vent settings for last 24 hours:    Physical Exam:  Gen: comfortable, no distress Neuro: non-focal exam HEENT: PERRL Neck: supple CV: RRR Pulm: unlabored breathing Abd: soft, NT GU: clear yellow urine Extr: wwp, no edema   No results found for this or any previous visit (from the past 24 hour(s)).  Assessment & Plan: The plan of care was discussed with the bedside nurse for the day, who is in agreement with this plan and no additional concerns were raised.   Present on Admission:  Fall    LOS: 6 days   Additional comments:I reviewed the patient's new clinical lab test results.   and I reviewed the patients new imaging test results.     FFH   Right rib fxs 2-12 - pain control, IS, pulm toilet Small R pneumothorax - repeat CXR stable  Right comminuted scapula fx - ortho c/s, non-op Possible L scapula fx - ortho c/s, Dr. Percell Miller, WBAT Compression fx of C7, T3-5 - NSGY c/s, continue collar Right humeral fx - ortho c/s, Dr. Griffin Basil, OR 2/12 for ORIF Right olecranon fx - ortho c/s, NWB Right possible radial neck fx - ortho c/s, NWB Right lower back hematoma - monitor AKI - normalized with fluids, monitor R wrist pain - XR today Relative hypotension - drop home BP med dose Elevated transaminases - downtrending T12-L5 right TP fx - pain control Acute blood loss anemia - trend R ear pain - looked with otoscope 2/12,  nothing concerning, will c/s ENT non-emergently  H/o gastric bypass at [place] 2008 - avoid nsaids  Chronic back pain on chronic narcotics - pain management by Dr. Suella Broad at [facility], escalate regimen to max tylenol, increase gabapentin, lido patches, incr oxycontin to 30 today FEN - regular diet, escalate bowel regimen DVT - SCDs, LMWH 40BID Dispo - 4NP, CIR pending   Bradley Oka, MD Trauma & General Surgery Please use [email or web address] to contact on call provider  04/21/2022  *Care during the described time interval was provided by me. I have reviewed this patient's available data, including medical history, events of note, physical examination and test results as part of my evaluation.

## 2022-04-21 NOTE — H&P (Signed)
Physical Medicine and Rehabilitation Admission H&P        Chief Complaint  Patient presents with   Functional deficits due to polytrauma      HPI: Bradley [name]. 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 He was found to have right rib Fx 2-12 with small R-PTX, right comminuted scapula Fx, right humerus Fx, radial neck Fx, lower back hematoma, right T12-L5 right TP Fx and ABLA. He was placed in long arm splint and made NWB. MRI C spine showed mild compression Fx of C7, T3, T4 and T5. C collar to continue with TLSO for comfort prn per NS. Dr. Griffin Basil consulted and patient underwent ORIF right distal humerus, open reduction of humeral shaft with internal fixation and right ulna nerve neurolysis with anterior transposition on 04/17/22. Post op to be NWB and dressings to be left intact till follow up.    Abnormal LFTs resolving and ABLA being monitored. Therapy has been working with patient who had syncope with unresponsiveness and bradycardia on 02/13. Pain control has been an issues and oxycontin added and being titrated up for better control. He has had issues with constipation therefore treated with multiple laxatives/enema today.   Flomax increased to 0.8 mg due to retention requiring I/O cath for 500 cc yesterday. Therapy has been working with patient who continues to be limited by weakness, pain,orthostatic symptoms with syncope/presyncope. CIR recommended due to functional decline.       Review of Systems  Constitutional:  Negative for fever.  HENT:  Positive for ear pain.   Respiratory:  Negative for cough and shortness of breath.   Cardiovascular:  Negative for chest pain and leg swelling.       Right lower chest wall pain--worse with activity  Gastrointestinal:  Positive for abdominal pain and constipation. Negative for heartburn and nausea.   Genitourinary:  Negative for dysuria.  Musculoskeletal:  Positive for back pain, joint pain and myalgias.  Neurological:  Positive for dizziness, sensory change and weakness. Negative for tingling and headaches.  Psychiatric/Behavioral:  The patient has insomnia (comes and goes).             Past Medical History:  Diagnosis Date   Allergy     Anxiety     Aortic valve sclerosis     Arthritis     Arthropathy, unspecified, other specified sites     CAD (coronary artery disease)      01/09/20: patient denied; faint coronary calcifications on 2016 CT   Depression     GERD (gastroesophageal reflux disease)     Heart murmur      mild MR, mild AS 06/2019 (Dr. [name])   History of kidney stones     Hypertension       " no longer have hypertension since gastric bypass "   Obesity, unspecified     Other, mixed, or unspecified nondependent drug abuse, unspecified     Personal history of urinary calculi     Priapism             Past Surgical History:  Procedure Laterality Date   ANKLE SURGERY Left 2003   ANTERIOR LAT LUMBAR FUSION Left 02/24/2016    Procedure: LEFT LUMBAR TWO-THREE  ANTERIOR LATERAL LUMBAR FUSION  ;  Surgeon: Earnie Larsson, MD;  Location: [facility];  Service: Neurosurgery;  Laterality: Left;  ANTERIOR LATERAL LUMBAR FUSION WITH PERCUTANEOUS SCREW 1 LEVEL Right 01/13/2020    Procedure: RIGHT LUMBAR ONE-TWO ANTERIOR LATERAL LUMBAR FUSION WITH PERCUTANEOUS SCREW PLACEMENT;  Surgeon: Earnie Larsson, MD;  Location: [facility];  Service: Neurosurgery;  Laterality: Right;  3C   CHOLECYSTECTOMY N/A 01/16/2022    Procedure: LAPAROSCOPIC CHOLECYSTECTOMY WITH INTRAOPERATIVE CHOLANGIOGRAM AND ICG DYE;  Surgeon: Greer Pickerel, MD;  Location: [facility];  Service: General;  Laterality: N/A;   GASTRIC BYPASS   2008   HERNIA REPAIR Left 2005    groin   INCISION AND DRAINAGE ABSCESS Right 07/06/2004    great toe   IRRIGATION AND DEBRIDEMENT ABSCESS Right 07/08/2004    great toe   KNEE ARTHROPLASTY  Right 04/26/2017    Procedure: RIGHT TOTAL KNEE ARTHROPLASTY WITH COMPUTER NAVIGATION;  Surgeon: Rod Can, MD;  Location: [facility];  Service: Orthopedics;  Laterality: Right;  Needs RNFA   KNEE SURGERY Right 2004   LUMBAR PERCUTANEOUS PEDICLE SCREW 1 LEVEL Left 02/24/2016    Procedure: LUMBAR PERCUTANEOUS PEDICLE SCREW 1 LEVEL;  Surgeon: Earnie Larsson, MD;  Location: [facility];  Service: Neurosurgery;  Laterality: Left;   MICRODISCECTOMY LUMBAR Left 08/24/2008    T12 - L1   ORIF HUMERUS FRACTURE Right 04/17/2022    Procedure: OPEN REDUCTION INTERNAL FIXATION (ORIF) RIGHT DISTAL HUMERUS FRACTURE;  Surgeon: Hiram Gash, MD;  Location: [facility];  Service: Orthopedics;  Laterality: Right;   panectomy   2010   TOTAL KNEE ARTHROPLASTY Right 04/2017           Family History  Problem Relation Age of Onset   Arthritis Mother     Colon cancer Mother     Breast cancer Mother          twice   Kidney failure Mother     Heart disease Father     Hypertension Father     Esophageal cancer Neg Hx     Inflammatory bowel disease Neg Hx     Liver disease Neg Hx     Pancreatic cancer Neg Hx     Stomach cancer Neg Hx     Rectal cancer Neg Hx        Social History:  Married. Independent PTA. Disabled due to back pain. Per  reports that he has never smoked. He has never used smokeless tobacco. He reports that he does not drink alcohol and does not use drugs.          Allergies  Allergen Reactions   Codeine Hives and Itching   Nsaids Other (See Comments)      H/o gastric bypass - can cause ulcers            Medications Prior to Admission  Medication Sig Dispense Refill   DODEX 1000 MCG/ML injection ADMINISTER 1 ML(1000 MCG) IN THE MUSCLE EVERY 30 DAYS (Patient taking differently: Inject 1,000 mcg into the muscle every 30 (thirty) days.) 3 mL 0   DULoxetine (CYMBALTA) 20 MG capsule Take 20 mg by mouth 2 (two) times daily.       Fe Fum-Fe Poly-Vit C-Lactobac (FUSION) 65-65-25-30 MG CAPS Take 1 capsule  by mouth 2 (two) times daily. [id number] capsule 1   folic acid (FOLVITE) 1 MG tablet TAKE 1 TABLET(1 MG) BY MOUTH DAILY (Patient taking differently: Take 1 mg by mouth daily. TAKE 1 TABLET(1 MG) BY MOUTH DAILY) 90 tablet 1   gabapentin (NEURONTIN) 300 MG capsule Take 300 mg by mouth 3 (three) times daily.  indapamide (LOZOL) 1.25 MG tablet Take 1 tablet (1.25 mg total) by mouth daily. 90 tablet 1   irbesartan (AVAPRO) 300 MG tablet TAKE 1 TABLET(300 MG) BY MOUTH DAILY 90 tablet 0   nystatin (MYCOSTATIN) 100000 UNIT/ML suspension Take 5 mLs (500,000 Units total) by mouth 4 (four) times daily. 60 mL 2   Oxycodone HCl 10 MG TABS Take 10 mg by mouth 5 (five) times daily as needed (pain.).       sildenafil (REVATIO) 20 MG tablet Take 5 tablets (100 mg total) by mouth as directed. 60 tablet 3   zinc gluconate 50 MG tablet TAKE 1 TABLET(50 MG) BY MOUTH DAILY 90 tablet 1   fluconazole (DIFLUCAN) 150 MG tablet Take 150 mg by mouth daily. (Patient not taking: Reported on 04/16/2022)       INS SYRINGE/NEEDLE 1CC/28G 28G X 1/2" 1 ML MISC Use to inject b12 monthly 10 each 1   Insulin Pen Needle 32G X 6 MM MISC 1 Act by Does not apply route daily. 100 each 1   meloxicam (MOBIC) 15 MG tablet Take 1 tablet every day by oral route with meal(s). (Patient not taking: Reported on 04/16/2022)       phentermine 37.5 MG capsule Take 1 capsule (37.5 mg total) by mouth every morning. (Patient not taking: Reported on 04/16/2022) 90 capsule 0   tirzepatide (ZEPBOUND) 2.5 MG/0.5ML Pen Inject 2.5 mg into the skin once a week. (Patient not taking: Reported on 04/16/2022) 2 mL 0            Home: Home Living Family/patient expects to be discharged to:: Private residence Living Arrangements: Spouse/significant other Available Help at Discharge: Family, Available 24 hours/day Type of Home: House Home Access: Stairs to enter [employer] of Steps: 4 Entrance Stairs-Rails: Right, Left Home Layout: Multi-level, Able to  live on main level with bedroom/bathroom Bathroom Shower/Tub: Multimedia programmer: Handicapped height Bathroom Accessibility: Yes Home Equipment: Civil engineer, contracting - built in, [place] - single point, Conservation officer, nature (2 wheels)   Functional History: Prior Function Prior Level of Function : Independent/Modified Independent, Driving, Working/employed Mobility Comments: no AD, went to the gym everyday ADLs Comments: indep   Functional Status:  Mobility: Bed Mobility Overal bed mobility: Needs Assistance Bed Mobility: Supine to Sit, Sit to Supine Rolling: Max assist Sidelying to sit: Max assist Supine to sit: Mod assist, HOB elevated Sit to supine: Max assist, +2 for physical assistance, +2 for safety/equipment General bed mobility comments: Increased difficulty maintaining back precautions during sit to supine transition requiring VC to refrain from holding onto bed rail with LUE. Transfers Overall transfer level: Needs assistance Equipment used: 1 person hand held assist Transfers: Sit to/from Stand, Bed to chair/wheelchair/BSC Sit to Stand: Mod assist, +2 safety/equipment Bed to/from chair/wheelchair/BSC transfer type:: Step pivot Stand pivot transfers: Mod assist, +2 physical assistance Step pivot transfers: Mod assist, +2 safety/equipment General transfer comment: assist for power up, rise, steadying, and assist at trunk to transition from [facility] to recliner during suspected orthostatic event Ambulation/Gait Ambulation/Gait assistance: Mod assist, +2 safety/equipment Gait Distance (Feet): 10 Feet Assistive device: 1 person hand held assist Gait Pattern/deviations: Step-through pattern, Decreased stride length, Trunk flexed General Gait Details: assist to steady, +2 for lines/leads. Cues for upright posture, room navigation   ADL: ADL Overall ADL's : Needs assistance/impaired Eating/Feeding: Minimal assistance, Bed level Grooming: Moderate assistance, Bed level Upper Body  Bathing: Maximal assistance, Bed level Lower Body Bathing: Total assistance, Bed level Upper Body Dressing : Total  assistance, Bed level Lower Body Dressing: Total assistance, Bed level, +2 for physical assistance Toilet Transfer: Moderate assistance, +2 for safety/equipment, Ambulation, BSC/3in1, Grab bars Toilet Transfer Details (indicate cue type and reason): Pt able to complete functional mobility from bed to [facility] over toilet to attempt to urinate. Grab bar was used to help with stability. Pt very slow moving with all sit<>stand transitions. Toileting- Clothing Manipulation and Hygiene: Total assistance, +2 for physical assistance, Bed level Functional mobility during ADLs: Maximal assistance, +2 for physical assistance, +2 for safety/equipment General ADL Comments: max A to EOB, standing limited by drop in BP   Cognition: Cognition Overall Cognitive Status: Within Functional Limits for tasks assessed Orientation Level: Oriented X4 Cognition Arousal/Alertness: Awake/alert (Sleepy) Behavior During Therapy: WFL for tasks assessed/performed, Flat affect Overall Cognitive Status: Within Functional Limits for tasks assessed General Comments: overall WFL- pt did have intermittent decrease in arousal throughout session which may have been due to orthostatic type behavior and fatigue level.     Blood pressure 138/67, pulse 87, temperature 98.2 F (36.8 C), temperature source Oral, resp. rate 18, height 5' 10"$  (1.778 m), weight 113.4 kg, SpO2 93 %. Physical Exam Vitals and nursing note reviewed.  Constitutional:      General: He is not in acute distress.    Appearance: Normal appearance.  HENT:     Head: Normocephalic and atraumatic.     Right Ear: External ear normal.     Left Ear: External ear normal.     Nose: Nose normal.  Eyes:     Extraocular Movements: Extraocular movements intact.     Pupils: Pupils are equal, round, and reactive to light.  Neck:     Comments: Immobilized with  C- collar Cardiovascular:     Rate and Rhythm: Normal rate and regular rhythm.     Pulses: Normal pulses.     Heart sounds: Murmur heard.     No gallop.  Pulmonary:     Effort: Pulmonary effort is normal. No respiratory distress.     Breath sounds: No wheezing, rhonchi or rales.  Abdominal:     General: Bowel sounds are normal. There is distension.     Tenderness: There is abdominal tenderness (RLQ>LLQ.).     Comments: Decreased bowel sounds  Musculoskeletal:     Comments: Right chest/flank pain with attempts at ROM RLE.  R arm immobilized in splint/sling. RUE 1+ edema, LUE 1+ edema  Skin:    General: Skin is warm.     Comments: Tatoo left arm. Scattered abrasions, bruises. RUE dressed/splinted  Neurological:     Mental Status: He is alert and oriented to person, place, and time.     Comments: Alert and oriented x 3. Normal insight and awareness. Intact Memory. Normal language and speech. Cranial nerve exam unremarkable. RUE limited d/t ortho LUE 4/5. RLE 2/5 prox to 4+/5 distally. LLE 3+/5 prox to 5/5 distally. No focal sensory loss appreciated. No abnl resting tone.   Psychiatric:     Comments: Pt a little flat but generally cooperative and appropriate.         Lab Results Last 48 Hours        Results for orders placed or performed during the hospital encounter of 04/14/22 (from the past 48 hour(s))  CBC     Status: Abnormal    Collection Time: 04/20/22  3:28 AM  Result Value Ref Range    WBC 5.3 4.0 - 10.5 K/uL    RBC 2.66 (L) 4.22 - 5.81  MIL/uL    Hemoglobin 7.7 (L) 13.0 - 17.0 g/dL    HCT 23.2 (L) 39.0 - 52.0 %    MCV 87.2 80.0 - 100.0 fL    MCH 28.9 26.0 - 34.0 pg    MCHC 33.2 30.0 - 36.0 g/dL    RDW 13.5 11.5 - 15.5 %    Platelets 189 150 - 400 K/uL    nRBC 0.0 0.0 - 0.2 %      Comment: Performed at [facility] [address], [place], [place] [id number]  Basic metabolic panel     Status: Abnormal    Collection Time: 04/20/22  3:28 AM  Result Value Ref Range     Sodium 134 (L) 135 - 145 mmol/L    Potassium 4.3 3.5 - 5.1 mmol/L    Chloride 95 (L) 98 - 111 mmol/L    CO2 30 22 - 32 mmol/L    Glucose, Bld 95 70 - 99 mg/dL      Comment: Glucose reference range applies only to samples taken after fasting for at least 8 hours.    BUN 15 6 - 20 mg/dL    Creatinine, Ser 0.68 0.61 - 1.24 mg/dL    Calcium 8.2 (L) 8.9 - 10.3 mg/dL    GFR, Estimated >60 >60 mL/min      Comment: (NOTE) Calculated using the CKD-EPI Creatinine Equation (2021)      Anion gap 9 5 - 15      Comment: Performed at [facility] [address], [place], [place] [address]       Imaging Results (Last 48 hours)  DG Wrist Complete Right   Result Date: 04/21/2022 CLINICAL DATA:  Right wrist pain after a 12 foot fall 04/14/2022. Initial encounter. EXAM: RIGHT WRIST - COMPLETE 3+ VIEW COMPARISON:  None Available. FINDINGS: The patient is in a plaster splint. No acute bony or joint abnormality is identified. Mild to moderate first CMC osteoarthritis noted. IMPRESSION: No acute abnormality. Electronically Signed   By: Inge Rise M.D.   On: 04/21/2022 09:18           Blood pressure 138/67, pulse 87, temperature 98.2 F (36.8 C), temperature source Oral, resp. rate 18, height 5' 10"$  (1.778 m), weight 113.4 kg, SpO2 93 %.   Medical Problem List and Plan: 1. Functional deficits secondary to polytrauma after falling through a ceiling             -patient may shower if RUE is covered and pt can tolerate             -ELOS/Goals: 10-12 days, supervision to mod I with PT, OT.  2.  Antithrombotics: -DVT/anticoagulation:  Pharmaceutical: Lovenox             -antiplatelet therapy: N/A 3. Chronic pain/Pain Management: Tylenol 650 mg qid.  - continueOxycontin 30 mg BID             --using oxycodone 15 mg TID (was on oxycodone 10 mg 5/xday)                          -pt told me he was wanting to wean off narcotics prior to this admission             --neuropathic pain: Gabapentin  up to 900 mg TID now. Cymbalta 20 mg bid             --also on Skelaxin 800 qid/robaxin 1000 tid-->back  spasms controlled on this.  4. Mood/Behavior/Sleep: LCSW to follow for evaluation and support.              --trazodone prn for insomnia.             -antipsychotic agents: N/A 5. Neuropsych/cognition: This patient is capable of making decisions on his own behalf. 6. Skin/Wound Care: Routine pressure relief measures.  7. Fluids/Electrolytes/Nutrition: Monitor I/O. Check CMET in am 18. C7 compression Fx: C collar at all times per Dr. Annette Stable.  9. Comminuted distal humerus fracture/olecranon Fx/?radial neck Fx:  S/p ORIF humerus-->NWB RUE  10. HTN/ Syncope: Low dose Avapro resumed on 02/16--will set hold parameters. --Monitor orthostatic BP. Add TEDs and abdominal binder.  11. T12-L5 right TP Fx:  TLSO prn 12. H/o gastric bypass: Resume Vitamin B12 IM monthly. Intake at baseline.  13. Abdominal pain/Acute on constipation:  -KUB ordered and demonstrates moderate gas in bowels --Took MOM at daily at night-->"nothing else works"--will resume --pt received mag citrate at 0930 and SMOG enema at 1130-->large results at 1300 today             --continue Miralax TID and colace 153m bid  -SS enema in AM if no further bm's.  14. Urinary retention: Flomax increased to 0.8 mg today-->change to HS as continues to almost black out at EOB per pt/wife.              --monitor voiding with bladder scans/PVR checks.  15. Soft tissue hematoma/ABLA: H/H with downward trend. Recheck CBC in am             --transfuse prn Hgb<7.0 or if symptomatic.  16. Hyponatremia: Recheck Na level in am.          PBary Leriche PA-C 04/21/2022

## 2022-04-21 NOTE — TOC Transition Note (Signed)
Transition of Care [facility]) - CM/SW Discharge Note   Patient Details  Name: Bradley [name] MRN: [id number] Date of Birth: 1970/05/25  [facility]) CM/SW Contact:  Ella Bodo, RN Phone Number: 04/21/2022, 12:41 PM   Clinical Narrative:    Patient medically stable for discharge, and insurance has approved admission to [facility].  Plan dc to CIR once bed ready.    Final next level of care: IP Rehab Facility Barriers to Discharge: Barriers Resolved   Patient Goals and CMS Choice CMS Medicare.gov Compare Post Acute Care list provided to:: Patient Choice offered to / list presented to : Patient   Discharge Plan and Services Additional resources added to the After Visit Summary for     Discharge Planning Services: CM Consult Post Acute Care Choice: IP Rehab                               Social Determinants of Health (SDOH) Interventions SDOH Screenings   Food Insecurity: No Food Insecurity (04/19/2022)  Housing: Low Risk  (04/19/2022)  Transportation Needs: No Transportation Needs (04/19/2022)  Utilities: Not At Risk (04/19/2022)  Alcohol Screen: Low Risk  (12/01/2021)  Depression (PHQ2-9): Low Risk  (03/29/2022)  Financial Resource Strain: High Risk (12/01/2021)  Physical Activity: Inactive (12/01/2021)  Social Connections: Socially Integrated (12/01/2021)  Stress: No Stress Concern Present (12/01/2021)  Tobacco Use: Low Risk  (04/21/2022)     Readmission Risk Interventions     No data to display         [name], RN, BSN  Trauma/Neuro ICU Case Manager [phone]

## 2022-04-21 NOTE — Care Management Important Message (Signed)
Important Message  Patient Details  Name: Bradley [name] MRN: [id number] Date of Birth: [birth year]   Medicare Important Message Given:  Other (see comment)     Hannah Beat 04/21/2022, 11:36 AM

## 2022-04-21 NOTE — Progress Notes (Signed)
Pt unable to void, bladder scanned for 550. Pt still unsuccessful to void on own. I/O cath performed using sterile technique. 800 ml of cloudy amber urine returned. Sheela Stack, LPN   [id number]  Unmeasured Output  Urine Occurrence 0 (unable to void)  Urine Characteristics  Urinary Incontinence No  Urine Color Amber  Urine Appearance Cloudy  Urinary Interventions Bladder scan  Bladder Scan Volume (mL) 550 mL  Intermittent/Straight Cath (mL) 800 mL  Hygiene Peri care

## 2022-04-21 NOTE — Discharge Summary (Signed)
[facility] Discharge Summary   Patient ID: Bradley [name] MRN: [id number] DOB/AGE: Jan 13, 1971 52 y.o.  Admit date: 04/14/2022 Discharge date: 04/21/2022   Discharge Diagnosis Fall from height Right rib fractures 2-12 Small Right pneumothorax  Right comminuted scapula fracture Possible Left scapula fracture Compression fracture of C7, T3-5  Right humeral fracture  Right olecranon fracture  Right possible radial neck fracture Right lower back hematoma  AKI  Right wrist pain  Relative hypotension Elevated transaminases  T12-L5 right transverse process fracture Acute blood loss anemia  Right ear pain  H/[place]   Chronic back pain on chronic narcotics   Consultants Orthopedics Neurosurgery  Imaging: DG Wrist Complete Right  Result Date: 04/21/2022 CLINICAL DATA:  Right wrist pain after a 12 foot fall 04/14/2022. Initial encounter. EXAM: RIGHT WRIST - COMPLETE 3+ VIEW COMPARISON:  None Available. FINDINGS: The patient is in a plaster splint. No acute bony or joint abnormality is identified. Mild to moderate first CMC osteoarthritis noted. IMPRESSION: No acute abnormality. Electronically Signed   By: Drusilla Kanner M.D.   On: 04/21/2022 09:18    Procedures Dr. Everardo Pacific (04/17/2022) -  Right distal humerus open reduction internal fixation Right humeral shaft open reduction internal fixation Right ulnar nerve neurolysis with anterior transposition  HPI:  Bradley [name] is a 52 y.o. 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 No loc. C/o back, right shoulder, left shoulder pain, LUE pain.  Denies LE pain. Some neck soreness but hard to say he states. No abd pain. No vision changes. Evaluated by ED and found to have multiple fractures.    Pain control has been challenging in ED per ED PA - fentanyl has had little success; has required  ketamine   H/o gastric bypass (probable mini GB) at [facility], chronic back pain on chronic narcotics (managed by [facility], takes five tablets of 10 mg oxycodone per day), HTN, obesity   Hospital Course: Right rib fractures 2-12, Small Right pneumothorax Patient did not require chest tube placement. Managed with multimodal pain control, IS, pulmonary toilet. Follow up chest xray stable without pneumothorax.  Right comminuted scapula fracture  Orthopedics consulted and recommended non-operative management  Possible Left scapula fracture Orthopedics consulted and ultimately determined this was a chronic issue, ok for WBAT LUE.  Compression fx of C7, T3-5 Neurosurgery was consulted and recommended nonoperative management in hard cervical collar. Follow up with Dr. Jordan Likes  Right intra-articular distal humerus fracture with massive amounts of comminution, right humeral shaft fracture  Orthopedics was consulted and took the patient to the OR 2/12 for procedures listed above. Postoperatively she was advised NWB RLE.  AKI  Creatinine mildly elevated on admission at 1.33. This normalized with fluids.  Right wrist pain  Xray negative for fracture  Elevated transaminases  Mildly elevated transaminases noted on admission. No evidence of liver injury on CT. These were monitored and downtrending  T12-L5 right transverse process fractures Managed with pain control  Chronic back pain on chronic narcotics  Patient with chronic back pain managed by Dr. Sheran Luz at [facility], requiring chronic narcotics. This made his acute pain difficult to manage and significant adjustments were made to his pain medication regimen. Dr. Ethelene Hal notified. Patient should follow up with pain specialist after discharge.  Patient worked with therapies during this admission who recommended inpatient rehab. On 2/16 the patient  was felt stable for discharge to CIR.  Patient will follow up as below and knows to call  with questions or concerns.     I was not directly involved in this patient's care therefore the information in this discharge summary was taken from the chart.      Follow-up Information     Sheran Luz, MD Follow up.   Specialty: Physical Medicine and Rehabilitation Contact information: [address] [place] [address] [phone]         Julio Sicks, MD Follow up.   Specialty: Neurosurgery Contact information: [address] [place] [address] [phone]         Bjorn Pippin, MD Follow up.   Specialty: Orthopedic Surgery Contact information: [address] [place] [address] [phone]                  Signed: Franne Forts, [facility] 04/21/2022, 9:36 AM Please see Amion for pager number during day hours 7:00am-4:30pm

## 2022-04-21 NOTE — Progress Notes (Signed)
Wasted 0.75m Dilaudid with PCasandra DoffingRN. Was unable to waste in the Pyxis before pt. Transfer to 4MW.

## 2022-04-21 NOTE — Progress Notes (Signed)
Inpatient Rehabilitation Admission Medication Review by a Pharmacist  A complete drug regimen review was completed for this patient to identify any potential clinically significant medication issues.  High Risk Drug Classes Is patient taking? Indication by Medication  Antipsychotic Yes Prochlorperazine - nausea  Anticoagulant Yes Enoxaparin - VTE prophylaxis   Antibiotic No   Opioid Yes Oxycodone ER, oxycodone IR - pain  Antiplatelet No   Hypoglycemics/insulin No   Vasoactive Medication Yes Irbesartan - HTN  Chemotherapy No   Other Yes Acetaminophen, lidocaine patch - pain Vit B12, MVI, zinc - supplementation  Docusate, MOM, Miralax - constipation Duloxetine, gabapentin - neuropathic pain Metaxalone, methocarbamol - back spasms Tamsulosin - urinary retention Trazodone prn - sleep     Type of Medication Issue Identified Description of Issue Recommendation(s)  Drug Interaction(s) (clinically significant)     Duplicate Therapy     Allergy     No Medication Administration End Date     Incorrect Dose     Additional Drug Therapy Needed     Significant med changes from prior encounter (inform family/care partners about these prior to discharge). PTA medications not resumed: Fusion vitamin, folic acid, indapamide, nystatin suspension, sildenafil Consider resuming PTA medications during CIR admit or upon discharge as appropriate    Other       Clinically significant medication issues were identified that warrant physician communication and completion of prescribed/recommended actions by midnight of the next day:  No  Name of provider notified for urgent issues identified:   Provider Method of Notification:     Pharmacist comments: none  Time spent performing this drug regimen review (minutes):  15   Thank you for involving pharmacy in this patient's care.  Renold Genta, PharmD, BCPS Clinical Pharmacist Clinical phone for 04/21/2022 is [phone] 04/21/2022 2:48 PM

## 2022-04-22 DIAGNOSIS — R339 Retention of urine, unspecified: Secondary | ICD-10-CM

## 2022-04-22 DIAGNOSIS — G8929 Other chronic pain: Secondary | ICD-10-CM

## 2022-04-22 DIAGNOSIS — G8911 Acute pain due to trauma: Secondary | ICD-10-CM

## 2022-04-22 DIAGNOSIS — I951 Orthostatic hypotension: Secondary | ICD-10-CM

## 2022-04-22 DIAGNOSIS — G4700 Insomnia, unspecified: Secondary | ICD-10-CM

## 2022-04-22 LAB — CBC WITH DIFFERENTIAL/PLATELET
Abs Immature Granulocytes: 0.04 10*3/uL (ref 0.00–0.07)
Basophils Absolute: 0 10*3/uL (ref 0.0–0.1)
Basophils Relative: 0 %
Eosinophils Absolute: 0.1 10*3/uL (ref 0.0–0.5)
Eosinophils Relative: 2 %
HCT: 23.3 % — ABNORMAL LOW (ref 39.0–52.0)
Hemoglobin: 7.8 g/dL — ABNORMAL LOW (ref 13.0–17.0)
Immature Granulocytes: 1 %
Lymphocytes Relative: 23 %
Lymphs Abs: 1.3 10*3/uL (ref 0.7–4.0)
MCH: 28.9 pg (ref 26.0–34.0)
MCHC: 33.5 g/dL (ref 30.0–36.0)
MCV: 86.3 fL (ref 80.0–100.0)
Monocytes Absolute: 0.6 10*3/uL (ref 0.1–1.0)
Monocytes Relative: 11 %
Neutro Abs: 3.7 10*3/uL (ref 1.7–7.7)
Neutrophils Relative %: 63 %
Platelets: 244 10*3/uL (ref 150–400)
RBC: 2.7 MIL/uL — ABNORMAL LOW (ref 4.22–5.81)
RDW: 13.7 % (ref 11.5–15.5)
WBC: 5.8 10*3/uL (ref 4.0–10.5)
nRBC: 0 % (ref 0.0–0.2)

## 2022-04-22 LAB — GLUCOSE, CAPILLARY: Glucose-Capillary: 122 mg/dL — ABNORMAL HIGH (ref 70–99)

## 2022-04-22 MED ORDER — OXYCODONE HCL 5 MG PO TABS
10.0000 mg | ORAL_TABLET | ORAL | Status: DC
  Administered 2022-04-22 – 2022-05-02 (×60): 10 mg via ORAL
  Filled 2022-04-22 (×61): qty 2

## 2022-04-22 MED ORDER — MELATONIN 5 MG PO TABS
5.0000 mg | ORAL_TABLET | Freq: Every day | ORAL | Status: DC
  Administered 2022-04-22 – 2022-05-01 (×10): 5 mg via ORAL
  Filled 2022-04-22 (×10): qty 1

## 2022-04-22 MED ORDER — TRAZODONE HCL 50 MG PO TABS: 25.0000 mg | ORAL_TABLET | Freq: Every evening | ORAL | Status: DC | PRN

## 2022-04-22 MED ORDER — TRAZODONE HCL 50 MG PO TABS
25.0000 mg | ORAL_TABLET | Freq: Every day | ORAL | Status: DC
  Administered 2022-04-22 – 2022-04-24 (×3): 25 mg via ORAL
  Filled 2022-04-22 (×3): qty 1

## 2022-04-22 MED ORDER — OXYCODONE HCL 5 MG PO TABS
5.0000 mg | ORAL_TABLET | ORAL | Status: DC | PRN
  Administered 2022-04-22 – 2022-04-29 (×26): 5 mg via ORAL
  Filled 2022-04-22 (×27): qty 1

## 2022-04-22 MED ORDER — ACETAMINOPHEN 325 MG PO TABS
325.0000 mg | ORAL_TABLET | Freq: Four times a day (QID) | ORAL | Status: DC | PRN
  Administered 2022-04-28 (×2): 325 mg via ORAL
  Filled 2022-04-22 (×2): qty 1

## 2022-04-22 NOTE — Progress Notes (Signed)
   ORTHOPAEDIC PROGRESS NOTE  s/p Procedure(s): OPEN REDUCTION INTERNAL FIXATION (ORIF) RIGHT DISTAL HUMERUS FRACTURE on 02/15/23 with Dr. Griffin Basil  SUBJECTIVE: Patient states he is having more pain today. He also reports his c-collar is irritating to him. He does not think his pain is being adequately managed.   OBJECTIVE: PE: General: sitting up in hospital bed, NAD RUE: Splint CDI. Skin intact though cannot assess fully beneath splint. Nontender to palpation proximally.. + Motor in  AIN, PIN, Ulnar distributions. Sensation intact in medial, radial, and ulnar distributions. Well perfused digits.    Vitals:   04/22/22 0350 04/22/22 0417  BP: 108/61 106/66  Pulse: 68 84  Resp: 18 18  Temp: 98.5 F (36.9 C)   SpO2: 93% 98%   Stable post-op images  ASSESSMENT: Bradley [name] is a 52 y.o. male POD#5  PLAN: Weightbearing: NWB RUE Insicional and dressing care: Dressings left intact until follow-up  - Keep splint clean and dry Orthopedic device(s): Splint VTE prophylaxis: per primary Pain control: Per primary team Follow - up plan: 1 week in office for incision check and xray Dispo: Currently in CIR  Contact information:  Weekdays 8-5 Dr. Ophelia Charter, Noemi Chapel PA-C, After hours and holidays please check [email or web address] for group call information for [facility]  Noemi Chapel, PA-C 04/22/2022

## 2022-04-22 NOTE — Plan of Care (Signed)
  Problem: RH Balance Goal: LTG Patient will maintain dynamic standing balance (PT) Description: LTG:  Patient will maintain dynamic standing balance with assistance during mobility activities (PT) Flowsheets (Taken 04/22/2022 0908) LTG: Pt will maintain dynamic standing balance during mobility activities with:: Supervision/Verbal cueing   Problem: Sit to Stand Goal: LTG:  Patient will perform sit to stand with assistance level (PT) Description: LTG:  Patient will perform sit to stand with assistance level (PT) Flowsheets (Taken 04/22/2022 0908) LTG: PT will perform sit to stand in preparation for functional mobility with assistance level: Supervision/Verbal cueing   Problem: RH Bed Mobility Goal: LTG Patient will perform bed mobility with assist (PT) Description: LTG: Patient will perform bed mobility with assistance, with/without cues (PT). Flowsheets (Taken 04/22/2022 0908) LTG: Pt will perform bed mobility with assistance level of: Supervision/Verbal cueing   Problem: RH Bed to Chair Transfers Goal: LTG Patient will perform bed/chair transfers w/assist (PT) Description: LTG: Patient will perform bed to chair transfers with assistance (PT). Flowsheets (Taken 04/22/2022 0908) LTG: Pt will perform Bed to Chair Transfers with assistance level: Supervision/Verbal cueing   Problem: RH Car Transfers Goal: LTG Patient will perform car transfers with assist (PT) Description: LTG: Patient will perform car transfers with assistance (PT). Flowsheets (Taken 04/22/2022 0908) LTG: Pt will perform car transfers with assist:: Contact Guard/Touching assist   Problem: RH Ambulation Goal: LTG Patient will ambulate in controlled environment (PT) Description: LTG: Patient will ambulate in a controlled environment, # of feet with assistance (PT). Flowsheets (Taken 04/22/2022 0908) LTG: Pt will ambulate in controlled environ  assist needed:: Supervision/Verbal cueing LTG: Ambulation distance in controlled  environment: 150' Goal: LTG Patient will ambulate in home environment (PT) Description: LTG: Patient will ambulate in home environment, # of feet with assistance (PT). Flowsheets (Taken 04/22/2022 0908) LTG: Pt will ambulate in home environ  assist needed:: Supervision/Verbal cueing LTG: Ambulation distance in home environment: 50'   Problem: RH Stairs Goal: LTG Patient will ambulate up and down stairs w/assist (PT) Description: LTG: Patient will ambulate up and down # of stairs with assistance (PT) Flowsheets (Taken 04/22/2022 0908) LTG: Pt will ambulate up/down stairs assist needed:: Contact Guard/Touching assist LTG: Pt will  ambulate up and down number of stairs: 4 or as per home setup

## 2022-04-22 NOTE — Progress Notes (Signed)
Physical Therapy Session Note  Patient Details  Name: Bradley [name] MRN: [id number] Date of Birth: [birth year]  Today's Date: 04/22/2022 PT Individual Time: 1400-1440 PT Individual Time Calculation (min): 40 min   Short Term Goals: Week 1:  PT Short Term Goal 1 (Week 1): Pt will complete bed mobility with minA PT Short Term Goal 2 (Week 1): Pt will complete bed<>chair transfers with CGA and LRAD PT Short Term Goal 3 (Week 1): Pt will ambulate 39f with minA and LRAD PT Short Term Goal 4 (Week 1): Pt will initiate stair training  Skilled Therapeutic Interventions/Progress Updates:      Pt supine in bed sleeping to start - awakens to voice and in agreement to therapy session. Pt with thigh=high compression socks on throughout treatment session. Pt requesting pain Rx - RN notiifed who provided during treatment session. Supine<>sitting EOB with modA for trunk and BLE support. Able to scoot himself to EOB without assist. Stand<>pivot transfer with HHA and minA to w/c - transported to main rehab gym.  Gait training in therapy gym [id number] HHA and minA, +2 assist for w/c follow for safety due to hypotension. Gait distance limited by fatigue. Primary gait deficits include step-to pattern, decreased speed, wide BOS - no knee buckling or LOB observed.  Stair training completed using 6inch steps and 1 hand rail on his L. Patient navigated up/down x4 steps total with CGA/minA - cues and instruction for step-to pattern with his L foot leading ascent and R leading descent. Again, no LOB or knee buckling but pain limiting.   Patient returned to his room as he was requesting to void. Ambulatory transfer with HHA and minA to bathroom - patient continent of bladder in standing with CGA for safety while unsupported. Needed sound of water from shower to help with urinating. Ambulatory transfer with HHA and minA back to bed - minA needed for returning to supine with HOB nearly fully elevated. Wife to assist with  comforting patient with pillows. All needs met at end of session.   Therapy Documentation Precautions:  Precautions Precautions: Fall, Cervical, Back Required Braces or Orthoses: Cervical Brace, Spinal Brace, Sling, Splint/Cast Cervical Brace: Hard collar, At all times Spinal Brace: Thoracolumbosacral orthotic (for comfort only) Splint/Cast: R long arm Restrictions Weight Bearing Restrictions: Yes RUE Weight Bearing: Non weight bearing General:      Therapy/Group: Individual Therapy  Lakeya Mulka P Dotty Gonzalo PT 04/22/2022, 7:50 AM

## 2022-04-22 NOTE — Plan of Care (Signed)
  Problem: RH Balance Goal: LTG Patient will maintain dynamic standing with ADLs (OT) Description: LTG:  Patient will maintain dynamic standing balance with assist during activities of daily living (OT)  Flowsheets (Taken 04/22/2022 1237) LTG: Pt will maintain dynamic standing balance during ADLs with: Supervision/Verbal cueing   Problem: Sit to Stand Goal: LTG:  Patient will perform sit to stand in prep for activites of daily living with assistance level (OT) Description: LTG:  Patient will perform sit to stand in prep for activites of daily living with assistance level (OT) Flowsheets (Taken 04/22/2022 1237) LTG: PT will perform sit to stand in prep for activites of daily living with assistance level: Supervision/Verbal cueing   Problem: RH Eating Goal: LTG Patient will perform eating w/assist, cues/equip (OT) Description: LTG: Patient will perform eating with assist, with/without cues using equipment (OT) Flowsheets (Taken 04/22/2022 1237) LTG: Pt will perform eating with assistance level of: Independent   Problem: RH Grooming Goal: LTG Patient will perform grooming w/assist,cues/equip (OT) Description: LTG: Patient will perform grooming with assist, with/without cues using equipment (OT) Flowsheets (Taken 04/22/2022 1237) LTG: Pt will perform grooming with assistance level of: Independent   Problem: RH Bathing Goal: LTG Patient will bathe all body parts with assist levels (OT) Description: LTG: Patient will bathe all body parts with assist levels (OT) Flowsheets (Taken 04/22/2022 1237) LTG: Pt will perform bathing with assistance level/cueing: Minimal Assistance - Patient > 75%   Problem: RH Dressing Goal: LTG Patient will perform upper body dressing (OT) Description: LTG Patient will perform upper body dressing with assist, with/without cues (OT). Flowsheets (Taken 04/22/2022 1237) LTG: Pt will perform upper body dressing with assistance level of: Minimal Assistance - Patient >  75% Goal: LTG Patient will perform lower body dressing w/assist (OT) Description: LTG: Patient will perform lower body dressing with assist, with/without cues in positioning using equipment (OT) Flowsheets (Taken 04/22/2022 1237) LTG: Pt will perform lower body dressing with assistance level of: Minimal Assistance - Patient > 75%   Problem: RH Toileting Goal: LTG Patient will perform toileting task (3/3 steps) with assistance level (OT) Description: LTG: Patient will perform toileting task (3/3 steps) with assistance level (OT)  Flowsheets (Taken 04/22/2022 1237) LTG: Pt will perform toileting task (3/3 steps) with assistance level: Minimal Assistance - Patient > 75%   Problem: RH Toilet Transfers Goal: LTG Patient will perform toilet transfers w/assist (OT) Description: LTG: Patient will perform toilet transfers with assist, with/without cues using equipment (OT) Flowsheets (Taken 04/22/2022 1237) LTG: Pt will perform toilet transfers with assistance level of: Supervision/Verbal cueing   Problem: RH Tub/Shower Transfers Goal: LTG Patient will perform tub/shower transfers w/assist (OT) Description: LTG: Patient will perform tub/shower transfers with assist, with/without cues using equipment (OT) Flowsheets (Taken 04/22/2022 1237) LTG: Pt will perform tub/shower stall transfers with assistance level of: Supervision/Verbal cueing LTG: Pt will perform tub/shower transfers from: Walk in shower

## 2022-04-22 NOTE — Progress Notes (Signed)
Occupational Therapy Note  Patient Details  Name: Bradley [name] MRN: [id number] Date of Birth: [birth year]  Due to multiple rib and spinal fractures, pt is not able to tolerate a gait belt.  Trialed using a long sheet folded to disperse pressure around torso while therapist held ends of sheet as a belt.  Pt only needs light A to sit to stand and stand pivot.  Recommending nursing staff use the long sheet for support vs gait belt.       [place] 04/22/2022, 12:38 PM

## 2022-04-22 NOTE — Evaluation (Signed)
Physical Therapy Assessment and Plan  Patient Details  Name: Bradley [name] MRN: [id number] Date of Birth: 1971/02/18  PT Diagnosis: Abnormality of gait, Difficulty walking, Muscle weakness, and Generalized Pain Rehab Potential: Good ELOS: 10-14 days   Today's Date: 04/22/2022 PT Individual Time: 0800-0911 PT Individual Time Calculation (min): 20 min    Hospital Problem: Principal Problem:   Trauma   Past Medical History:  Past Medical History:  Diagnosis Date   Allergy    Anxiety    Aortic valve sclerosis    Arthritis    Arthropathy, unspecified, other specified sites    CAD (coronary artery disease)    01/09/20: patient denied; faint coronary calcifications on 2016 CT   Depression    GERD (gastroesophageal reflux disease)    Heart murmur    mild MR, mild AS 06/2019 (Dr. [name])   History of kidney stones    Hypertension     " no longer have hypertension since gastric bypass "   Obesity, unspecified    Other, mixed, or unspecified nondependent drug abuse, unspecified    Personal history of urinary calculi    Priapism    Past Surgical History:  Past Surgical History:  Procedure Laterality Date   ANKLE SURGERY Left 2003   ANTERIOR LAT LUMBAR FUSION Left 02/24/2016   Procedure: LEFT LUMBAR TWO-THREE  ANTERIOR LATERAL LUMBAR FUSION  ;  Surgeon: Earnie Larsson, MD;  Location: [place];  Service: Neurosurgery;  Laterality: Left;   ANTERIOR LATERAL LUMBAR FUSION WITH PERCUTANEOUS SCREW 1 LEVEL Right 01/13/2020   Procedure: RIGHT LUMBAR ONE-TWO ANTERIOR LATERAL LUMBAR FUSION WITH PERCUTANEOUS SCREW PLACEMENT;  Surgeon: Earnie Larsson, MD;  Location: [place];  Service: Neurosurgery;  Laterality: Right;  3C   CHOLECYSTECTOMY N/A 01/16/2022   Procedure: LAPAROSCOPIC CHOLECYSTECTOMY WITH INTRAOPERATIVE CHOLANGIOGRAM AND ICG DYE;  Surgeon: Greer Pickerel, MD;  Location: [facility];  Service: General;  Laterality: N/A;   GASTRIC BYPASS  2008   HERNIA REPAIR Left 2005   groin   INCISION AND  DRAINAGE ABSCESS Right 07/06/2004   great toe   IRRIGATION AND DEBRIDEMENT ABSCESS Right 07/08/2004   great toe   KNEE ARTHROPLASTY Right 04/26/2017   Procedure: RIGHT TOTAL KNEE ARTHROPLASTY WITH COMPUTER NAVIGATION;  Surgeon: Rod Can, MD;  Location: [facility];  Service: Orthopedics;  Laterality: Right;  Needs RNFA   KNEE SURGERY Right 2004   LUMBAR PERCUTANEOUS PEDICLE SCREW 1 LEVEL Left 02/24/2016   Procedure: LUMBAR PERCUTANEOUS PEDICLE SCREW 1 LEVEL;  Surgeon: Earnie Larsson, MD;  Location: [place];  Service: Neurosurgery;  Laterality: Left;   MICRODISCECTOMY LUMBAR Left 08/24/2008   T12 - L1   ORIF HUMERUS FRACTURE Right 04/17/2022   Procedure: OPEN REDUCTION INTERNAL FIXATION (ORIF) RIGHT DISTAL HUMERUS FRACTURE;  Surgeon: Hiram Gash, MD;  Location: [place];  Service: Orthopedics;  Laterality: Right;   panectomy  2010   TOTAL KNEE ARTHROPLASTY Right 04/2017    Assessment & Plan Clinical Impression: Patient is a  52 year old male with history of  HTN, chronic back pain, gastric bypass who was admitted on 04/17/22 after falling thorough the ceiling 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 He was found to have right rib Fx 2-12 with small R-PTX, right comminuted scapula Fx, right humerus Fx, radial neck Fx, lower back hematoma, right T12-L5 right TP Fx and ABLA. He was placed in long arm splint and made NWB. MRI C spine showed mild compression Fx of C7, T3, T4 and T5. C collar to continue  with TLSO for comfort prn per NS. Dr. Griffin Basil consulted and patient underwent ORIF right distal humerus, open reduction of humeral shaft with internal fixation and right ulna nerve neurolysis with anterior transposition on 04/17/22. Post op to be NWB and dressings to be left intact till follow up.    Abnormal LFTs resolving and ABLA being monitored. Therapy has been working with patient who had syncope with unresponsiveness and bradycardia on 02/13. Pain control has been an issues and oxycontin added and being  titrated up for better control. He has had issues with constipation therefore treated with multiple laxatives/enema today.   Flomax increased to 0.8 mg due to retention requiring I/O cath for 500 cc yesterday. Therapy has been working with patient who continues to be limited by weakness, pain,orthostatic symptoms with syncope/presyncope. CIR recommended due to functional decline.    Patient transferred to CIR on 04/21/2022 .   Patient currently requires min/mod with mobility secondary to muscle weakness, decreased cardiorespiratoy endurance, and decreased standing balance and decreased balance strategies.  Prior to hospitalization, patient was independent  with mobility and lived with Spouse in a House home.  Home access is 4Stairs to enter, Other (comment) (has paved sidewalk that he can use to enter the home, can avoid the stairs if needed).  Patient will benefit from skilled PT intervention to maximize safe functional mobility, minimize fall risk, and decrease caregiver burden for planned discharge home with 24 hour assist.  Anticipate patient will benefit from follow up [facility] at discharge.  PT - End of Session Activity Tolerance: Tolerates < 10 min activity with changes in vital signs Endurance Deficit: Yes Endurance Deficit Description: Related to pain and impaired activity tolerance PT Assessment Rehab Potential (ACUTE/IP ONLY): Good PT Barriers to Discharge: Home environment access/layout;Insurance for SNF coverage;Weight;Weight bearing restrictions;Other (comments) (Pain Control) PT Patient demonstrates impairments in the following area(s): Balance;Edema;Endurance;Motor;Pain;Safety PT Transfers Functional Problem(s): Bed Mobility;Bed to Chair;Car PT Locomotion Functional Problem(s): Ambulation;Stairs PT Plan PT Intensity: Minimum of 1-2 x/day ,45 to 90 minutes PT Frequency: 5 out of 7 days PT Duration Estimated Length of Stay: 10-14 days PT Treatment/Interventions: Ambulation/gait  training;Balance/vestibular training;Cognitive remediation/compensation;Community reintegration;Discharge planning;Disease management/prevention;DME/adaptive equipment instruction;Functional electrical stimulation;Functional mobility training;Neuromuscular re-education;Pain management;Patient/family education;Psychosocial support;Skin care/wound management;Splinting/orthotics;Stair training;Therapeutic Activities;Therapeutic Exercise;UE/LE Strength taining/ROM;UE/LE Coordination activities;Wheelchair propulsion/positioning PT Transfers Anticipated Outcome(s): Supervision PT Locomotion Anticipated Outcome(s): Supervision PT Recommendation Recommendations for Other Services: Neuropsych consult Follow Up Recommendations: Home health PT;Outpatient PT;24 hour supervision/assistance Patient destination: Home Equipment Recommended: To be determined   PT Evaluation Precautions/Restrictions Precautions Precautions: Fall;Cervical;Back Required Braces or Orthoses: Cervical Brace;Spinal Brace;Sling;Splint/Cast Cervical Brace: Hard collar;At all times Spinal Brace: Thoracolumbosacral orthotic (for comfort only) Splint/Cast: R long arm Restrictions Weight Bearing Restrictions: Yes RUE Weight Bearing: Non weight bearing General    Pain Pain Assessment Pain Scale: 0-10 Pain Score: 9  Pain Type: Acute pain Pain Location: Shoulder Pain Orientation: Right Pain Descriptors / Indicators: Aching Pain Frequency: Constant Pain Onset: On-going Patients Stated Pain Goal: 0 Pain Intervention(s): Medication (See eMAR) Multiple Pain Sites: No Pain Interference Pain Interference Pain Effect on Sleep: 4. Almost constantly Pain Interference with Therapy Activities: 4. Almost constantly Pain Interference with Day-to-Day Activities: 4. Almost constantly Home Living/Prior Functioning Home Living Available Help at Discharge: Family;Available 24 hours/day;Friend(s) Type of Home: House Home Access: Stairs to  enter;Other (comment) (has paved sidewalk that he can use to enter the home, can avoid the stairs if needed) Entrance Stairs-Number of Steps: 4 Entrance Stairs-Rails: Right;Left Home Layout: Multi-level;Able to live on main  level with bedroom/bathroom;Full bath on main level Bathroom Shower/Tub: Multimedia programmer: Handicapped height Bathroom Accessibility: Yes  Lives With: Spouse Prior Function Level of Independence: Independent with gait;Independent with transfers;Other (comment) (at times would use cane depending on his back pain)  Able to Take Stairs?: Yes Driving: Yes Vocation: Full time employment Leisure: Hobbies-yes (Comment) (enjoys painting) Vision/Perception  Vision - History Ability to See in Adequate Light: 0 Adequate Perception Perception: Within Functional Limits Praxis Praxis: Intact  Cognition Overall Cognitive Status: Within Functional Limits for tasks assessed Arousal/Alertness: Awake/alert (flat affect) Orientation Level: Oriented X4 Attention: Focused;Sustained Focused Attention: Appears intact Sustained Attention: Appears intact Selective Attention: Appears intact Memory: Appears intact Awareness: Appears intact Problem Solving: Appears intact Safety/Judgment: Appears intact Comments: Will continue to monitor in a functional context Sensation Sensation Light Touch: Appears Intact Hot/Cold: Appears Intact Proprioception: Appears Intact Stereognosis: Appears Intact Coordination Gross Motor Movements are Fluid and Coordinated: No Motor  Motor Motor: Other (comment) Motor - Skilled Clinical Observations: Generalized weakness and deconditioning 2/2 polytrauma   Trunk/Postural Assessment  Cervical Assessment Cervical Assessment: Within Functional Limits Thoracic Assessment Thoracic Assessment: Within Functional Limits Lumbar Assessment Lumbar Assessment: Exceptions to [facility] (posterior tilt) Postural Control Postural Control: Within  Functional Limits  Balance Balance Balance Assessed: Yes Static Sitting Balance Static Sitting - Balance Support: Feet supported;No upper extremity supported Static Sitting - Level of Assistance: 5: Stand by assistance Dynamic Sitting Balance Dynamic Sitting - Balance Support: Feet supported;No upper extremity supported Dynamic Sitting - Level of Assistance: 4: Min Insurance risk surveyor Standing - Balance Support: Left upper extremity supported Static Standing - Level of Assistance: 4: Min assist Dynamic Standing Balance Dynamic Standing - Balance Support: Left upper extremity supported Dynamic Standing - Level of Assistance: 3: Mod assist Extremity Assessment      RLE Assessment RLE Assessment: Within Functional Limits LLE Assessment LLE Assessment: Within Functional Limits  Care Tool Care Tool Bed Mobility Roll left and right activity   Roll left and right assist level: Moderate Assistance - Patient 50 - 74%    Sit to lying activity   Sit to lying assist level: Maximal Assistance - Patient 25 - 49%    Lying to sitting on side of bed activity   Lying to sitting on side of bed assist level: the ability to move from lying on the back to sitting on the side of the bed with no back support.: Maximal Assistance - Patient 25 - 49%     Care Tool Transfers Sit to stand transfer   Sit to stand assist level: Minimal Assistance - Patient > 75%    Chair/bed transfer   Chair/bed transfer assist level: Minimal Assistance - Patient > 75%     Psychologist, counselling transfer activity did not occur: Safety/medical concerns        Care Tool Locomotion Ambulation   Assist level: Minimal Assistance - Patient > 75% Assistive device: Hand held assist Max distance: 44f  Walk 10 feet activity Walk 10 feet activity did not occur: Safety/medical concerns       Walk 50 feet with 2 turns activity Walk 50 feet with 2 turns activity did not occur:  Safety/medical concerns      Walk 150 feet activity Walk 150 feet activity did not occur: Safety/medical concerns      Walk 10 feet on uneven surfaces activity Walk 10 feet on uneven surfaces activity did not occur: Safety/medical concerns  Stairs Stair activity did not occur: Safety/medical concerns        Walk up/down 1 step activity Walk up/down 1 step or curb (drop down) activity did not occur: Safety/medical concerns      Walk up/down 4 steps activity Walk up/down 4 steps activity did not occur: Safety/medical concerns      Walk up/down 12 steps activity Walk up/down 12 steps activity did not occur: Safety/medical concerns      Pick up small objects from floor Pick up small object from the floor (from standing position) activity did not occur: Safety/medical concerns      Wheelchair Is the patient using a wheelchair?: Yes Type of Wheelchair: Manual   Wheelchair assist level: Dependent - Patient 0%    Wheel 50 feet with 2 turns activity   Assist Level: Dependent - Patient 0%  Wheel 150 feet activity   Assist Level: Dependent - Patient 0%    Refer to Care Plan for Long Term Goals  SHORT TERM GOAL WEEK 1 PT Short Term Goal 1 (Week 1): Pt will complete bed mobility with minA PT Short Term Goal 2 (Week 1): Pt will complete bed<>chair transfers with CGA and LRAD PT Short Term Goal 3 (Week 1): Pt will ambulate 39f with minA and LRAD PT Short Term Goal 4 (Week 1): Pt will initiate stair training  Recommendations for other services: Neuropsych  Skilled Therapeutic Intervention Mobility Bed Mobility Bed Mobility: Sit to Supine;Supine to Sit Supine to Sit: Maximal Assistance - Patient - Patient 25-49% Sit to Supine: Maximal Assistance - Patient 25-49% Transfers Transfers: Sit to Stand;Stand to Sit;Stand Pivot Transfers Sit to Stand: Moderate Assistance - Patient 50-74% Stand to Sit: Minimal Assistance - Patient > 75% Stand Pivot Transfers: Minimal Assistance -  Patient > 75% Stand Pivot Transfer Details: Verbal cues for precautions/safety;Verbal cues for gait pattern;Verbal cues for safe use of DME/AE;Tactile cues for initiation;Tactile cues for sequencing;Tactile cues for weight shifting;Tactile cues for posture;Manual facilitation for weight shifting Transfer (Assistive device): 1 person hand held assist Locomotion  Gait Ambulation: Yes Gait Assistance: Minimal Assistance - Patient > 75% Gait Distance (Feet): 5 Feet Assistive device: 1 person hand held assist Gait Assistance Details: Tactile cues for initiation;Tactile cues for sequencing;Verbal cues for gait pattern;Verbal cues for safe use of DME/AE;Verbal cues for precautions/safety;Manual facilitation for weight shifting;Tactile cues for posture Gait Gait: Yes Gait Pattern: Impaired Gait Pattern: Step-to pattern;Decreased stride length;Wide base of support Stairs / Additional Locomotion Stairs: No Wheelchair Mobility Wheelchair Mobility: No  Skilled Intervention: Pt in bed to start - supportive wife at the bedside. Patient in agreement to PT evaluation. Initiated functional mobility as outlined above. Patient is primarily pain limited related due polytrauma. Patient able to recall his NWB status for RUE, also aware of hard cervical collar at all times. [place] and comfort.   Orthostatic BP taken: Supine:117/72 Sitting:113/76 Standing:81/61 *pt sympomatic. Provided thigh-high compression socks with PA verbal order. RN notified.   Retrieved 20x18 standard w/c - pt needing pillow support for his back.   Short distance ambulation in his room, up to ~570fwith HHA and minA for trunk support - ambulation limited 2/2 pain and lightheadedness. Concluded session seated in recliner with BLE elevated, multiple pillows to support, wife at the bedside.   Discharge Criteria: Patient will be discharged from PT if patient refuses treatment 3 consecutive times without  medical reason, if treatment goals not met, if there is a change in medical status,  if patient makes no progress towards goals or if patient is discharged from hospital.  The above assessment, treatment plan, treatment alternatives and goals were discussed and mutually agreed upon: by patient and by family  Alger Simons PT, DPT 04/22/2022, 9:13 AM

## 2022-04-22 NOTE — Evaluation (Signed)
Occupational Therapy Assessment and Plan  Patient Details  Name: Bradley [name] MRN: [id number] Date of Birth: 1970-07-08  OT Diagnosis: acute pain, lumbago (low back pain), muscle weakness (generalized), pain in joint, pain in thoracic spine, and swelling of limb Rehab Potential: Rehab Potential (ACUTE ONLY): Good ELOS: 10-12 days   Today's Date: 04/22/2022 OT Individual Time: 1045-1200 OT Individual Time Calculation (min): 75 min     Hospital Problem: Principal Problem:   Trauma   Past Medical History:  Past Medical History:  Diagnosis Date   Allergy    Anxiety    Aortic valve sclerosis    Arthritis    Arthropathy, unspecified, other specified sites    CAD (coronary artery disease)    01/09/20: patient denied; faint coronary calcifications on 2016 CT   Depression    GERD (gastroesophageal reflux disease)    Heart murmur    mild MR, mild AS 06/2019 (Dr. [name])   History of kidney stones    Hypertension     " no longer have hypertension since gastric bypass "   Obesity, unspecified    Other, mixed, or unspecified nondependent drug abuse, unspecified    Personal history of urinary calculi    Priapism    Past Surgical History:  Past Surgical History:  Procedure Laterality Date   ANKLE SURGERY Left 2003   ANTERIOR LAT LUMBAR FUSION Left 02/24/2016   Procedure: LEFT LUMBAR TWO-THREE  ANTERIOR LATERAL LUMBAR FUSION  ;  Surgeon: Earnie Larsson, MD;  Location: [facility];  Service: Neurosurgery;  Laterality: Left;   ANTERIOR LATERAL LUMBAR FUSION WITH PERCUTANEOUS SCREW 1 LEVEL Right 01/13/2020   Procedure: RIGHT LUMBAR ONE-TWO ANTERIOR LATERAL LUMBAR FUSION WITH PERCUTANEOUS SCREW PLACEMENT;  Surgeon: Earnie Larsson, MD;  Location: [facility];  Service: Neurosurgery;  Laterality: Right;  3C   CHOLECYSTECTOMY N/A 01/16/2022   Procedure: LAPAROSCOPIC CHOLECYSTECTOMY WITH INTRAOPERATIVE CHOLANGIOGRAM AND ICG DYE;  Surgeon: Greer Pickerel, MD;  Location: [facility];  Service: General;  Laterality:  N/A;   GASTRIC BYPASS  2008   HERNIA REPAIR Left 2005   groin   INCISION AND DRAINAGE ABSCESS Right 07/06/2004   great toe   IRRIGATION AND DEBRIDEMENT ABSCESS Right 07/08/2004   great toe   KNEE ARTHROPLASTY Right 04/26/2017   Procedure: RIGHT TOTAL KNEE ARTHROPLASTY WITH COMPUTER NAVIGATION;  Surgeon: Rod Can, MD;  Location: [facility];  Service: Orthopedics;  Laterality: Right;  Needs RNFA   KNEE SURGERY Right 2004   LUMBAR PERCUTANEOUS PEDICLE SCREW 1 LEVEL Left 02/24/2016   Procedure: LUMBAR PERCUTANEOUS PEDICLE SCREW 1 LEVEL;  Surgeon: Earnie Larsson, MD;  Location: [facility];  Service: Neurosurgery;  Laterality: Left;   MICRODISCECTOMY LUMBAR Left 08/24/2008   T12 - L1   ORIF HUMERUS FRACTURE Right 04/17/2022   Procedure: OPEN REDUCTION INTERNAL FIXATION (ORIF) RIGHT DISTAL HUMERUS FRACTURE;  Surgeon: Hiram Gash, MD;  Location: [facility];  Service: Orthopedics;  Laterality: Right;   panectomy  2010   TOTAL KNEE ARTHROPLASTY Right 04/2017    Assessment & Plan Bradley [name] is a 52 year old male with history of  HTN, chronic back pain, gastric bypass who was admitted on 04/17/22 after falling thorough the ceiling 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 He was found to have right rib Fx 2-12 with small R-PTX, right comminuted scapula Fx, right humerus Fx, radial neck Fx, lower back hematoma, right T12-L5 right TP Fx and ABLA. He was placed in long arm splint and made NWB. MRI C spine  showed mild compression Fx of C7, T3, T4 and T5. C collar to continue with TLSO for comfort prn per NS. Dr. Griffin Basil consulted and patient underwent ORIF right distal humerus, open reduction of humeral shaft with internal fixation and right ulna nerve neurolysis with anterior transposition on 04/17/22. Post op to be NWB and dressings to be left intact till follow up.    Abnormal LFTs resolving and ABLA being monitored. Therapy has been working with patient who had syncope with unresponsiveness and bradycardia on  02/13. Pain control has been an issues and oxycontin added and being titrated up for better control. He has had issues with constipation therefore treated with multiple laxatives/enema today.   Flomax increased to 0.8 mg due to retention requiring I/O cath for 500 cc yesterday. Therapy has been working with patient who continues to be limited by weakness, pain,orthostatic symptoms with syncope/presyncope. CIR recommended due to functional decline.   Patient transferred to CIR on 04/21/2022 .    Patient currently requires max with basic self-care skills secondary to muscle weakness and muscle joint tightness, decreased cardiorespiratoy endurance, and decreased standing balance and orthostatic hypotension and pain .  Prior to hospitalization, patient was fully independent and caring for his grandchildren.  Patient will benefit from skilled intervention to increase independence with basic self-care skills prior to discharge home with care partner.  Anticipate patient will require minimal physical assistance and follow up home health.  OT - End of Session Activity Tolerance: Tolerates < 10 min activity with changes in vital signs Endurance Deficit: Yes Endurance Deficit Description: Related to pain and impaired activity tolerance; 93/63 BP sitting and 64/35 standing OT Assessment Rehab Potential (ACUTE ONLY): Good OT Patient demonstrates impairments in the following area(s): Balance;Endurance;Pain;Edema OT Basic ADL's Functional Problem(s): Bathing;Dressing;Toileting OT Transfers Functional Problem(s): Toilet OT Additional Impairment(s): None OT Plan OT Intensity: Minimum of 1-2 x/day, 45 to 90 minutes OT Frequency: 5 out of 7 days OT Duration/Estimated Length of Stay: 10-12 days OT Treatment/Interventions: Balance/vestibular training;Cognitive remediation/compensation;Discharge planning;DME/adaptive equipment instruction;Functional mobility training;Pain management;Patient/family  education;Psychosocial support;Self Care/advanced ADL retraining;Therapeutic Activities;Therapeutic Exercise OT Self Feeding Anticipated Outcome(s): independent OT Basic Self-Care Anticipated Outcome(s): min A bathing and dressing OT Toileting Anticipated Outcome(s): min toileting OT Bathroom Transfers Anticipated Outcome(s): supervision OT Recommendation Patient destination: Home Follow Up Recommendations: Home health OT Equipment Recommended: Tub/shower seat Patient and spouse consulted   OT Evaluation Precautions/Restrictions  Precautions Precautions: Fall;Cervical;Back Required Braces or Orthoses: Cervical Brace;Spinal Brace;Sling;Splint/Cast Cervical Brace: Hard collar;At all times Spinal Brace: Thoracolumbosacral orthotic (for comfort) Splint/Cast: R long arm Restrictions Weight Bearing Restrictions: Yes RUE Weight Bearing: Non weight bearing   Pain Pain Assessment Pain Scale: 0-10 Pain Score: 8  Pain Type: Acute pain Pain Location: Shoulder Pain Orientation: Right Pain Descriptors / Indicators: Aching Pain Frequency: Constant Pain Onset: On-going Pain Intervention(s): Medication (See eMAR) Home Living/Prior Functioning Home Living Available Help at Discharge: Family, Available 24 hours/day, Friend(s) Type of Home: House Home Access: Stairs to enter, Other (comment) (has paved sidewalk that he can use to enter the home, can avoid the stairs if needed) Entrance Stairs-Number of Steps: 4 Entrance Stairs-Rails: Right, Left Home Layout: Multi-level, Able to live on main level with bedroom/bathroom, Full bath on main level Bathroom Shower/Tub: Multimedia programmer: Handicapped height Bathroom Accessibility: Yes  Lives With: Spouse Prior Function Level of Independence: Independent with gait, Independent with transfers, Other (comment) (at times would use cane depending on his back pain)  Able to Take Stairs?: Yes Driving: Yes Vocation:  Full time  employment Leisure: Hobbies-yes (Comment) (enjoys painting) Vision Baseline Vision/History: 1 Wears glasses Ability to See in Adequate Light: 0 Adequate Patient Visual Report: No change from baseline Vision Assessment?: No apparent visual deficits Perception  Perception: Within Functional Limits Praxis Praxis: Intact Cognition Cognition Overall Cognitive Status: Within Functional Limits for tasks assessed Arousal/Alertness: Awake/alert Orientation Level: Person;Place;Situation Person: Oriented Place: Oriented Situation: Oriented Memory: Appears intact Attention: Focused;Sustained Focused Attention: Appears intact Sustained Attention: Appears intact Selective Attention: Appears intact Awareness: Appears intact Problem Solving: Appears intact Safety/Judgment: Appears intact Comments: Will continue to monitor in a functional context Brief Interview for Mental Status (BIMS) Repetition of Three Words (First Attempt): 3 Temporal Orientation: Year: Correct Temporal Orientation: Month: Accurate within 5 days Temporal Orientation: Day: Correct Recall: "Sock": Yes, no cue required Recall: "Blue": Yes, no cue required Recall: "Bed": Yes, no cue required BIMS Summary Score: 15 Sensation Sensation Light Touch: Appears Intact Hot/Cold: Appears Intact Proprioception: Appears Intact Stereognosis: Appears Intact Motor  Motor Motor: Other (comment) Motor - Skilled Clinical Observations: Generalized weakness and deconditioning 2/2 polytrauma  Trunk/Postural Assessment  Cervical Assessment Cervical Assessment: Within Functional Limits Thoracic Assessment Thoracic Assessment: Within Functional Limits Lumbar Assessment Lumbar Assessment: Exceptions to Eating [facility] (posterior tilt) Postural Control Postural Control: Within Functional Limits  Balance Balance Balance Assessed: Yes Static Sitting Balance Static Sitting - Balance Support: Feet supported;No upper extremity supported Static  Sitting - Level of Assistance: 5: Stand by assistance Dynamic Sitting Balance Dynamic Sitting - Balance Support: Feet supported;No upper extremity supported Dynamic Sitting - Level of Assistance: 4: Min Insurance risk surveyor Standing - Balance Support: Left upper extremity supported Static Standing - Level of Assistance: 4: Min assist Dynamic Standing Balance Dynamic Standing - Balance Support: Left upper extremity supported Dynamic Standing - Level of Assistance: 3: Mod assist Extremity/Trunk Assessment RUE Assessment Active Range of Motion (AROM) Comments: has full finger flex and extension, causes elbow pain when he tries to extend wrist.  Elbow in long arm splint.  Unable to move shoulder due to scapula fracture and pain. LUE Assessment LUE Assessment: Within Functional Limits  Care Tool Care Tool Self Care Eating   Eating Assist Level: Set up assist    Oral Care    Oral Care Assist Level: Set up assist    Bathing   Body parts bathed by patient: Chest;Abdomen;Face Body parts bathed by helper: Right arm;Left arm;Front perineal area;Buttocks;Right upper leg;Left upper leg;Left lower leg;Right lower leg   Assist Level: Maximal Assistance - Patient 24 - 49%    Upper Body Dressing(including orthotics)   What is the patient wearing?: Pull over shirt   Assist Level: Total Assistance - Patient < 25%    Lower Body Dressing (excluding footwear)   What is the patient wearing?: Underwear/pull up;Pants Assist for lower body dressing: Total Assistance - Patient < 25%    Putting on/Taking off footwear   What is the patient wearing?: Non-skid slipper socks Assist for footwear: Total Assistance - Patient < 25%       Care Tool Toileting Toileting activity   Assist for toileting: Total Assistance - Patient < 25%     Care Tool Bed Mobility Roll left and right activity   Roll left and right assist level: Moderate Assistance - Patient 50 - 74%    Sit to lying  activity   Sit to lying assist level: Maximal Assistance - Patient 25 - 49%    Lying to sitting on side of bed activity  Lying to sitting on side of bed assist level: the ability to move from lying on the back to sitting on the side of the bed with no back support.: Maximal Assistance - Patient 25 - 49%     Care Tool Transfers Sit to stand transfer   Sit to stand assist level: Minimal Assistance - Patient > 75%    Chair/bed transfer   Chair/bed transfer assist level: Minimal Assistance - Patient > 75%     Toilet transfer   Assist Level: Minimal Assistance - Patient > 75%     Care Tool Cognition  Expression of Ideas and Wants Expression of Ideas and Wants: 4. Without difficulty (complex and basic) - expresses complex messages without difficulty and with speech that is clear and easy to understand  Understanding Verbal and Non-Verbal Content Understanding Verbal and Non-Verbal Content: 4. Understands (complex and basic) - clear comprehension without cues or repetitions   Memory/Recall Ability Memory/Recall Ability : Current season;Location of own room;Staff names and faces;That he or she is in a hospital/hospital unit   Refer to Care Plan for [place] Level 1    Recommendations for other services: None    Skilled Therapeutic Intervention ADL ADL Eating: Set up Grooming: Setup Upper Body Bathing: Maximal assistance Lower Body Bathing: Dependent Upper Body Dressing: Maximal assistance Lower Body Dressing: Dependent Toileting: Dependent Where Assessed-Toileting: Bedside Commode Toilet Transfer: Minimal assistance Toilet Transfer Method: Stand pivot Toilet Transfer Equipment: Drop arm bedside commode Mobility  Bed Mobility Bed Mobility: Sit to Supine;Supine to Sit Supine to Sit: Maximal Assistance - Patient - Patient 25-49% Sit to Supine: Maximal Assistance - Patient 25-49% Transfers Sit to Stand: Moderate Assistance - Patient 50-74% Stand to  Sit: Minimal Assistance - Patient > 75%  Pt seen for initial evaluation and initiation of ADL training with spouse present.  Discussed OT POC, pt's goals, ELOS and role of OT.  Discussed home set up. Today only had pt work on standing and stand pivot transfers due to a very low drop in standing blood pressure.  Practiced sit to stands and stand pivots from recliner to [facility] to recliner and then to bed.  Max to move supine to bed. Due to a great number of injuries in RUE and torso, pt does need significant A and will likely need min A at dc with self care but should be able to do transfers and ambulation with S as he only needs min A on eval. Pt resting in bed with all needs met and spouse in room.   Discharge Criteria: Patient will be discharged from OT if patient refuses treatment 3 consecutive times without medical reason, if treatment goals not met, if there is a change in medical status, if patient makes no progress towards goals or if patient is discharged from hospital.  The above assessment, treatment plan, treatment alternatives and goals were discussed and mutually agreed upon: by patient and by family  [place] 04/22/2022, 12:22 PM

## 2022-04-22 NOTE — Progress Notes (Signed)
PROGRESS NOTE   Subjective/Complaints:  [name] issues with pain control overnight-- frustrated by having to ask for pain meds, then wait for meds to come, states by then he is "behind on pain" and struggles to feel pain controlled again. Somewhat confused by pain regimen, frustrated, but later able to calmly discuss regimen. Essentially wants to schedule the PRN doses-- able to come to a compromise of scheduling some and still having PRNs. Mentions that at home he was taking Oxycodone IR q5h in addition to gabapentin (314m TID, now increased during hospitalization), and Cymbalta. Also on Oxycontin XR previously. Has high narcotic tolerance. Discussed that he may benefit from having changes to regimen, per weekday team.  Didn't sleep well because of pain control. Wants scheduled sleep meds as well.  LBM yesterday. Urinating ok, has intermittent retention but is able to urinate most of the time.    ROS: +pain-multiple locations, +difficulty sleeping, +intermittent urinary retention. Denies fevers, chills, CP, SOB, abd pain, N/V/D/C, new/worsening paresthesias/weakness, or any other complaints at this time.     Objective:   DG Abd 1 View  Result Date: 04/21/2022 CLINICAL DATA:  Abdominal pain. EXAM: ABDOMEN - 1 VIEW COMPARISON:  04/14/2022 FINDINGS: Moderate amount of bowel gas throughout the abdomen and pelvis. Evidence for colonic gas. Surgical hardware in the lumbar spine. Small linear and bandlike densities in the lower lungs are suggestive for atelectasis and/or scarring. Limited evaluation for free air on the supine images. Cholecystectomy clips. IMPRESSION: Moderate amount of bowel gas throughout the abdomen and pelvis. Findings are nonspecific but could be associated with an ileus pattern. Electronically Signed   By: AMarkus DaftM.D.   On: 04/21/2022 15:04   DG Wrist Complete Right  Result Date: 04/21/2022 CLINICAL DATA:  Right wrist  pain after a 12 foot fall 04/14/2022. Initial encounter. EXAM: RIGHT WRIST - COMPLETE 3+ VIEW COMPARISON:  None Available. FINDINGS: The patient is in a plaster splint. No acute bony or joint abnormality is identified. Mild to moderate first CMC osteoarthritis noted. IMPRESSION: No acute abnormality. Electronically Signed   By: TInge RiseM.D.   On: 04/21/2022 09:18   Recent Labs    04/21/22 1448 04/22/22 0641  WBC 5.3 5.8  HGB 7.4* 7.8*  HCT 22.6* 23.3*  PLT 232 244   Recent Labs    04/20/22 0328 04/21/22 1448  NA 134* 134*  K 4.3 4.5  CL 95* 98  CO2 30 27  GLUCOSE 95 127*  BUN 15 21*  CREATININE 0.68 0.91  CALCIUM 8.2* 8.1*    Intake/Output Summary (Last 24 hours) at 04/22/2022 1620 Last data filed at 04/22/2022 1346 Gross per 24 hour  Intake 710 ml  Output 900 ml  Net -190 ml        Physical Exam: Vital Signs Blood pressure 106/66, pulse 84, temperature 98.5 F (36.9 C), temperature source Oral, resp. rate 18, height 5' 10"$  (1.778 m), weight 111.7 kg, SpO2 98 %.  Constitutional:  NAD although visibly frustrated. Sitting in w/c HENT: Stephen/AT, MMM Eyes: EOMI, PERRL Neck: Immobilized with C- collar Cardiovascular: RRR, holosystolic murmur heard best at LUSB, no rub/gallops, distal pulses intact Pulmonary: CTAB, no w/r/r  Abdominal: soft, mildly distended, nonTTP, no r/g/r, +BS throughout although slightly hypoactive.  Extremities: RUE splinted and in sling.  Prior exam: Musculoskeletal: Right chest/flank pain with attempts at ROM RLE.  R arm immobilized in splint/sling. RUE 1+ edema, LUE 1+ edema  Skin:    General: Skin is warm.     Comments: Tattoo left arm. Scattered abrasions, bruises. RUE dressed/splinted  Neurological:     Mental Status: He is alert and oriented to person, place, and time.     Comments: Alert and oriented x 3. Normal insight and awareness. Intact Memory. Normal language and speech. Cranial nerve exam unremarkable. RUE limited d/t ortho LUE  4/5. RLE 2/5 prox to 4+/5 distally. LLE 3+/5 prox to 5/5 distally. No focal sensory loss appreciated. No abnl resting tone.   Psychiatric:     Comments: Pt a little flat but generally cooperative and appropriate.    Assessment/Plan: 1. Functional deficits which require 3+ hours per day of interdisciplinary therapy in a comprehensive inpatient rehab setting. Physiatrist is providing close team supervision and 24 hour management of active medical problems listed below. Physiatrist and rehab team continue to assess barriers to discharge/monitor patient progress toward functional and medical goals  Care Tool:  Bathing              Bathing assist       Upper Body Dressing/Undressing Upper body dressing        Upper body assist      Lower Body Dressing/Undressing Lower body dressing            Lower body assist       Toileting Toileting    Toileting assist       Transfers Chair/bed transfer  Transfers assist           Locomotion Ambulation   Ambulation assist              Walk 10 feet activity   Assist           Walk 50 feet activity   Assist           Walk 150 feet activity   Assist           Walk 10 feet on uneven surface  activity   Assist           Wheelchair     Assist               Wheelchair 50 feet with 2 turns activity    Assist            Wheelchair 150 feet activity     Assist          Blood pressure 106/66, pulse 84, temperature 98.5 F (36.9 C), temperature source Oral, resp. rate 18, height 5' 10"$  (1.778 m), weight 111.7 kg, SpO2 98 %.  Medical Problem List and Plan: 1. Functional deficits secondary to polytrauma after falling through a ceiling             -patient may shower if RUE is covered and pt can tolerate             -ELOS/Goals: 10-12 days, supervision to mod I with PT, OT.   -Continue CIR 2.  Antithrombotics: -DVT/anticoagulation:  Pharmaceutical: Lovenox  35m q12h             -antiplatelet therapy: N/A 3. Chronic pain/Pain Management: Tylenol 650 mg qid + 3240mq6h PRN, Lidoderm 2patches QD - continue Oxycontin 12hr 30 mg BID             --  using oxycodone 15 mg TID (was on oxycodone 10 mg 5x/day)  -pt told me he was wanting to wean off narcotics prior to this admission -04/22/22 pt with increasing pain d/t PRN dosing; agreeable to change Oxycodone IR to 11m q4h scheduled with 535mq4h PRN-- this will actually end up giving him slightly less on a schedule than what he's been getting with the PRNs, but still leave a PRN available if he needs it; monitor if this works better, may want to consider alternative pain control method given his tolerance to oxycodone (?nucynta or other newer opioid med)-- defer to weekday team             --neuropathic pain: Gabapentin up to 900 mg TID now. Cymbalta 20 mg bid --also on Skelaxin 80091mID and robaxin 1000m87mD-->back spasms controlled on this.  4. Mood/Behavior/Sleep: LCSW to follow for evaluation and support.              --trazodone prn for insomnia -04/22/22 changed trazodone to scheduled 25mg55m+25mg 86mand added scheduled Melatonin 5mg QH78m           -antipsychotic agents: N/A 5. Neuropsych/cognition: This patient is capable of making decisions on his own behalf. 6. Skin/Wound Care: Routine pressure relief measures.  7. Fluids/Electrolytes/Nutrition: Monitor I/O. Check weekly BMPs plus LFTs on 04/24/22 18. C7 compression Fx: C collar at all times per Dr. Pool.  Annette Stableomminuted distal humerus fracture/olecranon Fx/?radial neck Fx:  S/p ORIF humerus 04/17/22-->NWB RUE  10. HTN/ Syncope: Low dose Avapro 37.5mg QD 20mumed on 02/16--will set hold parameters. --Monitor orthostatic BP. Add thigh high TEDs and abdominal binder.  -04/22/22 orthostatics+ during PT eval this morning but Avapro had been given, and TEDs not in place; advised use of TEDs and reminded nursing staff about the Avapro order parameters;  monitor Vitals:   04/21/22 1411 04/21/22 1935 04/22/22 0350 04/22/22 0417  BP: (!) 104/53 109/66 108/61 106/66   04/22/22 1346  BP: 98/62    11. T12-L5 right TP Fx:  TLSO prn 12. H/o gastric bypass: Resume Vitamin B12 IM monthly and vitamins. Intake at baseline.  13. Abdominal pain/Acute on constipation:  -KUB ordered and demonstrates moderate gas in bowels, ?ileus --Took MOM at daily at night-->"nothing else works"--will resume --pt received mag citrate at 0930 and SMOG enema at 1130-->large results at 1300 today --continue Miralax 17g TID and colace 100mg bid54mlcolax supp PRN, fleet's PRN             -SS enema in AM if no further bm's.  -04/22/22 LBM yesterday x3, abd pain improved, no n/v reported; monitor on current regimen 14. Urinary retention: Flomax increased to 0.8 mg today-->change to HS as continues to almost black out at EOB per pt/wife.              --monitor voiding with bladder scans/PVR checks.  -04/22/22 PVRs not being documented, required ISC last night, urinating better this morning; monitor 15. Soft tissue hematoma/ABLA: H/H with downward trend. Recheck CBC in am             --transfuse prn Hgb<7.0 or if symptomatic.  -04/22/22 Hgb 7.8, remaining stable (9.4>8.7>7.8>7.7>7.4>7.8), continue to monitor 16. Hyponatremia: 133-135 prior to admission. Monitor on weekly labs starting 04/24/22   I spent >50 mins on coordination of care today, d/t discussion regarding pain control regimen, discussions with nursing re BP meds, discussion with pharmacy re pain meds and sleep meds. Long discussion with pt and wife regarding  plans for this weekend.   LOS: 1 days A FACE TO [place] 04/22/2022, 7:07 AM

## 2022-04-23 MED ORDER — SORBITOL 70 % SOLN: 30.0000 mL | Freq: Every day | Status: DC | PRN

## 2022-04-23 NOTE — Progress Notes (Signed)
PROGRESS NOTE   Subjective/Complaints:  Doing much better today, pain control improved with scheduled dosing. Hasn't needed much PRN, just a couple times. Noticed he's more sore today after evals yesterday-- primarily back and ribs. Slept better. Hasn't had a BM since Friday but feels like today he will produce one. Urination is going better as well. Denies any other complaints today.    ROS: +pain-multiple locations, mostly back/ribs-improving, +difficulty sleeping-improving, +intermittent urinary retention-improving. Denies fevers, chills, CP, SOB, abd pain, N/V/D/C, new/worsening paresthesias/weakness, or any other complaints at this time.     Objective:   DG Abd 1 View  Result Date: 04/21/2022 CLINICAL DATA:  Abdominal pain. EXAM: ABDOMEN - 1 VIEW COMPARISON:  04/14/2022 FINDINGS: Moderate amount of bowel gas throughout the abdomen and pelvis. Evidence for colonic gas. Surgical hardware in the lumbar spine. Small linear and bandlike densities in the lower lungs are suggestive for atelectasis and/or scarring. Limited evaluation for free air on the supine images. Cholecystectomy clips. IMPRESSION: Moderate amount of bowel gas throughout the abdomen and pelvis. Findings are nonspecific but could be associated with an ileus pattern. Electronically Signed   By: Markus Daft M.D.   On: 04/21/2022 15:04   Recent Labs    04/21/22 1448 04/22/22 0641  WBC 5.3 5.8  HGB 7.4* 7.8*  HCT 22.6* 23.3*  PLT 232 244   Recent Labs    04/21/22 1448  NA 134*  K 4.5  CL 98  CO2 27  GLUCOSE 127*  BUN 21*  CREATININE 0.91  CALCIUM 8.1*    Intake/Output Summary (Last 24 hours) at 04/23/2022 1103 Last data filed at 04/22/2022 1911 Gross per 24 hour  Intake 840 ml  Output 275 ml  Net 565 ml        Physical Exam: Vital Signs Blood pressure 92/63, pulse 83, temperature 98.4 F (36.9 C), temperature source Oral, resp. rate 17, height 5' 10"$   (1.778 m), weight 111.6 kg, SpO2 95 %.  Constitutional:  NAD, happier than yesterday. Sitting in chair. Slightly pale but similar to yesterday.  HENT: [place]/AT, MMM Eyes: EOMI, PERRL Neck: Immobilized with C- collar Cardiovascular: RRR, holosystolic murmur heard best at LUSB, no rub/gallops, distal pulses intact Pulmonary: CTAB, no w/r/r Abdominal: soft, mildly distended, nonTTP, no r/g/r, +BS throughout although slightly hypoactive-perhaps slightly more active than yesterday but still fairly quiet.  Extremities: RUE splinted and in sling.  Prior exam: Musculoskeletal: Right chest/flank pain with attempts at ROM RLE.  R arm immobilized in splint/sling. RUE 1+ edema, LUE 1+ edema  Skin:    General: Skin is warm.     Comments: Tattoo left arm. Scattered abrasions, bruises. RUE dressed/splinted  Neurological:     Mental Status: He is alert and oriented to person, place, and time.     Comments: Alert and oriented x 3. Normal insight and awareness. Intact Memory. Normal language and speech. Cranial nerve exam unremarkable. RUE limited d/t ortho LUE 4/5. RLE 2/5 prox to 4+/5 distally. LLE 3+/5 prox to 5/5 distally. No focal sensory loss appreciated. No abnl resting tone.   Psychiatric:     Comments: Pt a little flat but generally cooperative and appropriate.    Assessment/Plan:  1. Functional deficits which require 3+ hours per day of interdisciplinary therapy in a comprehensive inpatient rehab setting. Physiatrist is providing close team supervision and 24 hour management of active medical problems listed below. Physiatrist and rehab team continue to assess barriers to discharge/monitor patient progress toward functional and medical goals  Care Tool:  Bathing    Body parts bathed by patient: Chest, Abdomen, Face   Body parts bathed by helper: Right arm, Left arm, Front perineal area, Buttocks, Right upper leg, Left upper leg, Left lower leg, Right lower leg     Bathing assist Assist Level:  Maximal Assistance - Patient 24 - 49%     Upper Body Dressing/Undressing Upper body dressing   What is the patient wearing?: Pull over shirt    Upper body assist Assist Level: Total Assistance - Patient < 25%    Lower Body Dressing/Undressing Lower body dressing      What is the patient wearing?: Underwear/pull up, Pants     Lower body assist Assist for lower body dressing: Total Assistance - Patient < 25%     Toileting Toileting    Toileting assist Assist for toileting: Total Assistance - Patient < 25%     Transfers Chair/bed transfer  Transfers assist     Chair/bed transfer assist level: Minimal Assistance - Patient > 75%     Locomotion Ambulation   Ambulation assist      Assist level: Minimal Assistance - Patient > 75% Assistive device: Hand held assist Max distance: 95FT   Walk 10 feet activity   Assist  Walk 10 feet activity did not occur: Safety/medical concerns  Assist level: Minimal Assistance - Patient > 75% Assistive device: Hand held assist   Walk 50 feet activity   Assist Walk 50 feet with 2 turns activity did not occur: Safety/medical concerns  Assist level: Minimal Assistance - Patient > 75% Assistive device: Hand held assist    Walk 150 feet activity   Assist Walk 150 feet activity did not occur: Safety/medical concerns         Walk 10 feet on uneven surface  activity   Assist Walk 10 feet on uneven surfaces activity did not occur: Safety/medical concerns         Wheelchair     Assist Is the patient using a wheelchair?: Yes Type of Wheelchair: Manual    Wheelchair assist level: Dependent - Patient 0%      Wheelchair 50 feet with 2 turns activity    Assist        Assist Level: Dependent - Patient 0%   Wheelchair 150 feet activity     Assist      Assist Level: Dependent - Patient 0%   Blood pressure 92/63, pulse 83, temperature 98.4 F (36.9 C), temperature source Oral, resp. rate 17,  height 5' 10"$  (1.778 m), weight 111.6 kg, SpO2 95 %.  Medical Problem List and Plan: 1. Functional deficits secondary to polytrauma after falling through a ceiling             -patient may shower if RUE is covered and pt can tolerate             -ELOS/Goals: 10-12 days, supervision to mod I with PT, OT.   -Continue CIR 2.  Antithrombotics: -DVT/anticoagulation:  Pharmaceutical: Lovenox 90m q12h             -antiplatelet therapy: N/A 3. Chronic pain/Pain Management: Tylenol 650 mg qid + 3236mq6h PRN, Lidoderm 2patches QD - continue  Oxycontin 12hr 30 mg BID             --using oxycodone 15 mg TID (was on oxycodone 10 mg 5x/day)  -pt told me he was wanting to wean off narcotics prior to this admission -04/22/22 pt with increasing pain d/t PRN dosing; agreeable to change Oxycodone IR to 43m q4h scheduled with 531mq4h PRN-- this will actually end up giving him slightly less on a schedule than what he's been getting with the PRNs, but still leave a PRN available if he needs it; monitor if this works better, may want to consider alternative pain control method given his tolerance to oxycodone (?nucynta or other newer opioid med)-- defer to weekday team -04/23/22 much better control of pain with scheduled dosing-- not using much PRN; monitor --neuropathic pain: Gabapentin up to 900 mg TID now. Cymbalta 20 mg bid --also on Skelaxin 80056mID and robaxin 1000m1mD-->back spasms controlled on this.  4. Mood/Behavior/Sleep: LCSW to follow for evaluation and support.              --trazodone prn for insomnia -04/22/22 changed trazodone to scheduled 25mg43m+25mg 26mand added scheduled Melatonin 5mg QH33m           -antipsychotic agents: N/A 5. Neuropsych/cognition: This patient is capable of making decisions on his own behalf. 6. Skin/Wound Care: Routine pressure relief measures.  7. Fluids/Electrolytes/Nutrition: Monitor I/O. Check weekly BMPs plus LFTs on 04/24/22 18. C7 compression Fx: C collar at  all times per Dr. Pool.  Annette Stableomminuted distal humerus fracture/olecranon Fx/?radial neck Fx:  S/p ORIF humerus 04/17/22-->NWB RUE  10. HTN/ Syncope: Low dose Avapro 37.5mg QD 71mumed on 02/16--will set hold parameters. --Monitor orthostatic BP. Add thigh high TEDs and abdominal binder.  -04/22/22 orthostatics+ during PT eval this morning but Avapro had been given, and TEDs not in place; advised use of TEDs and reminded nursing staff about the Avapro order parameters; monitor -04/23/22 BPs soft but stable, cont to monitor.  Vitals:   04/21/22 1411 04/21/22 1935 04/22/22 0350 04/22/22 0417  BP: (!) 104/53 109/66 108/61 106/66   04/22/22 1346 04/22/22 2025 04/23/22 0441 04/23/22 0807  BP: 98/62 106/65 103/67 92/63    11. T12-L5 right TP Fx:  TLSO prn 12. H/o gastric bypass: Resume Vitamin B12 IM monthly and vitamins. Intake at baseline.  13. Abdominal pain/Acute on constipation:  -KUB ordered and demonstrates moderate gas in bowels, ?ileus --Took MOM at daily at night-->"nothing else works"--will resume --pt received mag citrate at 0930 and SMOG enema at 1130-->large results at 1300 today --continue Miralax 17g TID and colace 100mg bid66mlcolax supp PRN, fleet's PRN             -SS enema in AM if no further bm's.  -04/22/22 LBM yesterday x3, abd pain improved, no n/v reported; monitor on current regimen -04/23/22 no BM since 2/16 but refused miralax a few times-- feels he might have one today, advised not missing meds today and also ordered sorbitol 30ml PRN 31mase he needs it-- discussed he will likely need enema if he doesn't have a BM by tomorrow 14. Urinary retention: Flomax increased to 0.8 mg today-->change to HS as continues to almost black out at EOB per pt/wife.              --monitor voiding with bladder scans/PVR checks.  -04/22/22 PVRs not being documented, required ISC last night, urinating better this morning; monitor 15. Soft tissue hematoma/ABLA: H/H with downward  trend. Recheck CBC  in am             --transfuse prn Hgb<7.0 or if symptomatic.  -04/22/22 Hgb 7.8, remaining stable (9.4>8.7>7.8>7.7>7.4>7.8), continue to monitor 16. Hyponatremia: 133-135 prior to admission. Monitor on weekly labs starting 04/24/22    LOS: 2 days A FACE TO [place] 04/23/2022, 11:03 AM

## 2022-04-24 DIAGNOSIS — M25521 Pain in right elbow: Secondary | ICD-10-CM

## 2022-04-24 DIAGNOSIS — D62 Acute posthemorrhagic anemia: Secondary | ICD-10-CM

## 2022-04-24 DIAGNOSIS — K5903 Drug induced constipation: Secondary | ICD-10-CM

## 2022-04-24 LAB — CBC
HCT: 25.8 % — ABNORMAL LOW (ref 39.0–52.0)
Hemoglobin: 8.7 g/dL — ABNORMAL LOW (ref 13.0–17.0)
MCH: 28.9 pg (ref 26.0–34.0)
MCHC: 33.7 g/dL (ref 30.0–36.0)
MCV: 85.7 fL (ref 80.0–100.0)
Platelets: 344 10*3/uL (ref 150–400)
RBC: 3.01 MIL/uL — ABNORMAL LOW (ref 4.22–5.81)
RDW: 14.2 % (ref 11.5–15.5)
WBC: 5.1 10*3/uL (ref 4.0–10.5)
nRBC: 0 % (ref 0.0–0.2)

## 2022-04-24 LAB — HEPATIC FUNCTION PANEL
ALT: 37 U/L (ref 0–44)
AST: 39 U/L (ref 15–41)
Albumin: 2.6 g/dL — ABNORMAL LOW (ref 3.5–5.0)
Alkaline Phosphatase: 89 U/L (ref 38–126)
Bilirubin, Direct: 0.1 mg/dL (ref 0.0–0.2)
Indirect Bilirubin: 0.8 mg/dL (ref 0.3–0.9)
Total Bilirubin: 0.9 mg/dL (ref 0.3–1.2)
Total Protein: 5.9 g/dL — ABNORMAL LOW (ref 6.5–8.1)

## 2022-04-24 LAB — BASIC METABOLIC PANEL
Anion gap: 13 (ref 5–15)
BUN: 16 mg/dL (ref 6–20)
CO2: 23 mmol/L (ref 22–32)
Calcium: 8.7 mg/dL — ABNORMAL LOW (ref 8.9–10.3)
Chloride: 97 mmol/L — ABNORMAL LOW (ref 98–111)
Creatinine, Ser: 0.91 mg/dL (ref 0.61–1.24)
GFR, Estimated: >60 mL/min (ref >60)
Glucose, Bld: 125 mg/dL — ABNORMAL HIGH (ref 70–99)
Potassium: 3.9 mmol/L (ref 3.5–5.1)
Sodium: 133 mmol/L — ABNORMAL LOW (ref 135–145)

## 2022-04-24 MED ORDER — TRAMADOL HCL 50 MG PO TABS
50.0000 mg | ORAL_TABLET | Freq: Four times a day (QID) | ORAL | Status: DC | PRN
  Administered 2022-04-24 – 2022-04-29 (×4): 50 mg via ORAL
  Filled 2022-04-24 (×5): qty 1

## 2022-04-24 MED ORDER — SORBITOL 70 % SOLN
30.0000 mL | Freq: Once | Status: DC
  Filled 2022-04-24: qty 30

## 2022-04-24 NOTE — Progress Notes (Signed)
Pt is refusing Tramadol 11m prn for pain prior to Oxycodone 514mas per provider's order.Educated patient on refusal.

## 2022-04-24 NOTE — Progress Notes (Signed)
Inpatient Rehabilitation Care Coordinator Assessment and Plan Patient Details  Name: Bradley [name] MRN: [id number] Date of Birth: 1970-12-25  Today's Date: 04/24/2022  Hospital Problems: Principal Problem:   Trauma  Past Medical History:  Past Medical History:  Diagnosis Date   Allergy    Anxiety    Aortic valve sclerosis    Arthritis    Arthropathy, unspecified, other specified sites    CAD (coronary artery disease)    01/09/20: patient denied; faint coronary calcifications on 2016 CT   Depression    GERD (gastroesophageal reflux disease)    Heart murmur    mild MR, mild AS 06/2019 (Dr. [name])   History of kidney stones    Hypertension     " no longer have hypertension since gastric bypass "   Obesity, unspecified    Other, mixed, or unspecified nondependent drug abuse, unspecified    Personal history of urinary calculi    Priapism    Past Surgical History:  Past Surgical History:  Procedure Laterality Date   ANKLE SURGERY Left 2003   ANTERIOR LAT LUMBAR FUSION Left 02/24/2016   Procedure: LEFT LUMBAR TWO-THREE  ANTERIOR LATERAL LUMBAR FUSION  ;  Surgeon: Earnie Larsson, MD;  Location: [place];  Service: Neurosurgery;  Laterality: Left;   ANTERIOR LATERAL LUMBAR FUSION WITH PERCUTANEOUS SCREW 1 LEVEL Right 01/13/2020   Procedure: RIGHT LUMBAR ONE-TWO ANTERIOR LATERAL LUMBAR FUSION WITH PERCUTANEOUS SCREW PLACEMENT;  Surgeon: Earnie Larsson, MD;  Location: [place];  Service: Neurosurgery;  Laterality: Right;  3C   CHOLECYSTECTOMY N/A 01/16/2022   Procedure: LAPAROSCOPIC CHOLECYSTECTOMY WITH INTRAOPERATIVE CHOLANGIOGRAM AND ICG DYE;  Surgeon: Greer Pickerel, MD;  Location: [facility];  Service: General;  Laterality: N/A;   GASTRIC BYPASS  2008   HERNIA REPAIR Left 2005   groin   INCISION AND DRAINAGE ABSCESS Right 07/06/2004   great toe   IRRIGATION AND DEBRIDEMENT ABSCESS Right 07/08/2004   great toe   KNEE ARTHROPLASTY Right 04/26/2017   Procedure: RIGHT TOTAL KNEE ARTHROPLASTY  WITH COMPUTER NAVIGATION;  Surgeon: Rod Can, MD;  Location: [facility];  Service: Orthopedics;  Laterality: Right;  Needs RNFA   KNEE SURGERY Right 2004   LUMBAR PERCUTANEOUS PEDICLE SCREW 1 LEVEL Left 02/24/2016   Procedure: LUMBAR PERCUTANEOUS PEDICLE SCREW 1 LEVEL;  Surgeon: Earnie Larsson, MD;  Location: [place];  Service: Neurosurgery;  Laterality: Left;   MICRODISCECTOMY LUMBAR Left 08/24/2008   T12 - L1   ORIF HUMERUS FRACTURE Right 04/17/2022   Procedure: OPEN REDUCTION INTERNAL FIXATION (ORIF) RIGHT DISTAL HUMERUS FRACTURE;  Surgeon: Hiram Gash, MD;  Location: [place];  Service: Orthopedics;  Laterality: Right;   panectomy  2010   TOTAL KNEE ARTHROPLASTY Right 04/2017   Social History:  reports that he has never smoked. He has never used smokeless tobacco. He reports that he does not drink alcohol and does not use drugs.  Family / Support Systems Marital Status: Married Patient Roles: Spouse, Parent Spouse/Significant Other: [name] [phone] Children: [name]-daughter [phone]  [name]-daughtetr [phone] [name] [phone] Other Supports: freinds and neighbors Anticipated Caregiver: [name] Ability/Limitations of Caregiver: Wife is taking FMLA to assist him and is in good health and can assist Caregiver Availability: 24/7 Family Dynamics: Close knit family with three children who are all local and will assist when able. Wife is present and very supportive and will take FMLA to be there with him at DC  Social History Preferred language: English Religion: Baptist Cultural Background: No issues Education: [place] -  How often do you need to have someone help you when you read instructions, pamphlets, or other written material from your doctor or pharmacy?: Never Writes: Yes Employment Status: Disabled Public relations account executive Issues: No issues Guardian/Conservator: None-according to MD pt is capable of making his own decisions while here.    Abuse/Neglect Abuse/Neglect Assessment Can Be Completed: Yes Physical Abuse: Denies Verbal Abuse: Denies Sexual Abuse: Denies Exploitation of patient/patient's resources: Denies Self-Neglect: Denies  Patient response to: Social Isolation - How often do you feel lonely or isolated from those around you?: Never  Emotional Status Pt's affect, behavior and adjustment status: Pt ia motivated to do well but is having pain issues and this limits him in therapies. He is trying to push forward. His wfife is here and supportive and encoruages him. Recent Psychosocial Issues: other health issues-chronic back issues Psychiatric History: No history may benefit from seeing neuro-psych while here for coping. Have placed on list Substance Abuse History: No issues  Patient / Family Perceptions, Expectations & Goals Pt/Family understanding of illness & functional limitations: Pt and wife can explain his injuries and limitations with WB issues. Both do talk with the MD when rounding and feel their questions and concerns have been addressed. Aware it will take time to heal and recover from his fractures. Premorbid pt/family roles/activities: Husband, father, grandfather, retiree, friend, etc Anticipated changes in roles/activities/participation: resume Pt/family expectations/goals: Pt states: " I hope to be moving as best I can but know I will need help with my bathing and dressing." Wife states: " I wil take time off and be there with him."  [employer]: None Premorbid Home Care/DME Agencies: Other (Comment) (higher toliets at home) Transportation available at discharge: Wife Is the patient able to respond to transportation needs?: Yes In the past 12 months, has lack of transportation kept you from medical appointments or from getting medications?: No In the past 12 months, has lack of transportation kept you from meetings, work, or from getting things needed for daily  living?: No Resource referrals recommended: Neuropsychology  Discharge Planning Living Arrangements: Spouse/significant other Support Systems: Spouse/significant other, Children, Other relatives, Friends/neighbors, Church/faith community Type of Residence: Private residence 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 Son works at [employer] and would like to go there for OP. Will work on DME and follow up Care Coordinator Barriers to Discharge: Weight bearing restrictions, Insurance for SNF coverage Care Coordinator Anticipated Follow Up Needs: HH/OP  Clinical Impression Pleasant gentleman who is struggling with pain from his fractures. His wife is here and supportive and will be taking FMLA to be there with him at home. Will work on DME and OP needs.  Elease Hashimoto 04/24/2022, 11:25 AM

## 2022-04-24 NOTE — Progress Notes (Signed)
[facility] Individual Statement of Services  Patient Name:  Bradley [name]  Date:  04/24/2022  Welcome to the [facility].  Our goal is to provide you with an individualized program based on your diagnosis and situation, designed to meet your specific needs.  With this comprehensive rehabilitation program, you will be expected to participate in at least 3 hours of rehabilitation therapies Monday-Friday, with modified therapy programming on the weekends.  Your rehabilitation program will include the following services:  Physical Therapy (PT), Occupational Therapy (OT), 24 hour per day rehabilitation nursing, Therapeutic Recreaction (TR), Neuropsychology, Care Coordinator, Rehabilitation Medicine, Nutrition Services, and Pharmacy Services  Weekly team conferences will be held on Wednesday to discuss your progress.  Your Inpatient Rehabilitation Care Coordinator will talk with you frequently to get your input and to update you on team discussions.  Team conferences with you and your family in attendance may also be held.  Expected length of stay: 12-14 days  Overall anticipated outcome: supervision-min level  Depending on your progress and recovery, your program may change. Your Inpatient Rehabilitation Care Coordinator will coordinate services and will keep you informed of any changes. Your Inpatient Rehabilitation Care Coordinator's name and contact numbers are listed  below.  The following services may also be recommended but are not provided by the [facility] will be made to provide these services after discharge if needed.  Arrangements include referral to agencies that provide these services.  Your insurance has been verified to be:  [employer]-medicare Your primary doctor is:  [name]  Pertinent information will be  shared with your doctor and your insurance company.  Inpatient Rehabilitation Care Coordinator:  Ovidio Kin, [place] or Emilia Beck  Information discussed with and copy given to patient by: Elease Hashimoto, 04/24/2022, 9:18 AM

## 2022-04-24 NOTE — Progress Notes (Signed)
PROGRESS NOTE   Subjective/Complaints:  Patient seen at bedside today.  He reports continued pain although reports oxycodone is helping.  His blood pressure has been decreased when standing although denies dizziness or other orthostatic symptoms.  Therapy is going to try ACE wraps.  ROS: +pain-multiple locations, mostly back/ribs-improving, +difficulty sleeping-improving, +intermittent urinary retention-improving. Denies fevers, chills, CP, SOB, abd pain, cough, N/V/D/C, new/worsening paresthesias/weakness, or any other complaints at this time.     Objective:   No results found. Recent Labs    04/22/22 0641 04/24/22 0651  WBC 5.8 5.1  HGB 7.8* 8.7*  HCT 23.3* 25.8*  PLT 244 344    Recent Labs    04/21/22 1448 04/24/22 0651  NA 134* 133*  K 4.5 3.9  CL 98 97*  CO2 27 23  GLUCOSE 127* 125*  BUN 21* 16  CREATININE 0.91 0.91  CALCIUM 8.1* 8.7*     Intake/Output Summary (Last 24 hours) at 04/24/2022 [id number] Last data filed at 04/24/2022 0431 Gross per 24 hour  Intake 960 ml  Output 950 ml  Net 10 ml         Physical Exam: Vital Signs Blood pressure 102/72, pulse 75, temperature 98.1 F (36.7 C), temperature source Oral, resp. rate 18, height 5' 10"$  (1.778 m), weight 111.5 kg, SpO2 95 %.  Constitutional:  NAD, happier than yesterday. Sitting in chair. Slightly pale but similar to yesterday.  HENT: [place]/AT, MMM Eyes: EOMI, PERRL Neck: Immobilized with C- collar Cardiovascular: RRR, holosystolic murmur heard best at LUSB, no rub/gallops, distal pulses intact Pulmonary: CTAB, no w/r/r Abdominal: soft, mildly distended, nonTTP, no r/g/r, +BS throughout although slightly hypoactive-perhaps slightly more active than yesterday but still fairly quiet.  Extremities: RUE splinted and in sling.  Prior exam: Musculoskeletal: Right chest/flank pain with attempts at ROM RLE.  R arm immobilized in splint/sling. RUE 1+ edema,  LUE 1+ edema  Skin:    General: Skin is warm.     Comments: Tattoo left arm. Scattered abrasions, bruises. RUE dressed/splinted  Neurological:     Mental Status: He is alert and oriented to person, place, and time.     Comments: Alert and oriented x 3. Normal insight and awareness. Intact Memory. Normal language and speech. Cranial nerve exam unremarkable. RUE limited d/t ortho LUE 4/5. RLE 2/5 prox to 4+/5 distally. LLE 3+/5 prox to 5/5 distally. No focal sensory loss appreciated. No abnl resting tone.   Psychiatric:     Comments: Pt a little flat but generally cooperative and appropriate.    Assessment/Plan: 1. Functional deficits which require 3+ hours per day of interdisciplinary therapy in a comprehensive inpatient rehab setting. Physiatrist is providing close team supervision and 24 hour management of active medical problems listed below. Physiatrist and rehab team continue to assess barriers to discharge/monitor patient progress toward functional and medical goals  Care Tool:  Bathing    Body parts bathed by patient: Chest, Abdomen, Face   Body parts bathed by helper: Right arm, Left arm, Front perineal area, Buttocks, Right upper leg, Left upper leg, Left lower leg, Right lower leg     Bathing assist Assist Level: Maximal Assistance - Patient 24 - 49%  Upper Body Dressing/Undressing Upper body dressing   What is the patient wearing?: Pull over shirt    Upper body assist Assist Level: Total Assistance - Patient < 25%    Lower Body Dressing/Undressing Lower body dressing      What is the patient wearing?: Underwear/pull up, Pants     Lower body assist Assist for lower body dressing: Total Assistance - Patient < 25%     Toileting Toileting    Toileting assist Assist for toileting: Total Assistance - Patient < 25%     Transfers Chair/bed transfer  Transfers assist     Chair/bed transfer assist level: Minimal Assistance - Patient > 75%      Locomotion Ambulation   Ambulation assist      Assist level: Minimal Assistance - Patient > 75% Assistive device: Hand held assist Max distance: 95FT   Walk 10 feet activity   Assist  Walk 10 feet activity did not occur: Safety/medical concerns  Assist level: Minimal Assistance - Patient > 75% Assistive device: Hand held assist   Walk 50 feet activity   Assist Walk 50 feet with 2 turns activity did not occur: Safety/medical concerns  Assist level: Minimal Assistance - Patient > 75% Assistive device: Hand held assist    Walk 150 feet activity   Assist Walk 150 feet activity did not occur: Safety/medical concerns         Walk 10 feet on uneven surface  activity   Assist Walk 10 feet on uneven surfaces activity did not occur: Safety/medical concerns         Wheelchair     Assist Is the patient using a wheelchair?: Yes Type of Wheelchair: Manual    Wheelchair assist level: Dependent - Patient 0%      Wheelchair 50 feet with 2 turns activity    Assist        Assist Level: Dependent - Patient 0%   Wheelchair 150 feet activity     Assist      Assist Level: Dependent - Patient 0%   Blood pressure 102/72, pulse 75, temperature 98.1 F (36.7 C), temperature source Oral, resp. rate 18, height 5' 10"$  (1.778 m), weight 111.5 kg, SpO2 95 %.  Medical Problem List and Plan: 1. Functional deficits secondary to polytrauma after falling through a ceiling             -patient may shower if RUE is covered and pt can tolerate             -ELOS/Goals: 10-12 days, supervision to mod I with PT, OT.   -Continue CIR 2.  Antithrombotics: -DVT/anticoagulation:  Pharmaceutical: Lovenox 87m q12h             -antiplatelet therapy: N/A 3. Chronic pain/Pain Management: Tylenol 650 mg qid + 3259mq6h PRN, Lidoderm 2patches QD - continue Oxycontin 12hr 30 mg BID             --using oxycodone 15 mg TID (was on oxycodone 10 mg 5x/day)  -pt told me he was  wanting to wean off narcotics prior to this admission -04/22/22 pt with increasing pain d/t PRN dosing; agreeable to change Oxycodone IR to 101m4h scheduled with 5mg24mh PRN-- this will actually end up giving him slightly less on a schedule than what he's been getting with the PRNs, but still leave a PRN available if he needs it; monitor if this works better, may want to consider alternative pain control method given his tolerance to oxycodone (?nucynta  or other newer opioid med)-- defer to weekday team -04/23/22 much better control of pain with scheduled dosing-- not using much PRN; monitor  -04/24/22 Tramadol PRN was started today for mild pain --neuropathic pain: Gabapentin up to 900 mg TID now. Cymbalta 20 mg bid --also on Skelaxin 815m QID and robaxin 10017mTID-->back spasms controlled on this.  4. Mood/Behavior/Sleep: LCSW to follow for evaluation and support.              --trazodone prn for insomnia -04/22/22 changed trazodone to scheduled 2559mHS+60m29mN and added scheduled Melatonin 5mg 86m             -antipsychotic agents: N/A 5. Neuropsych/cognition: This patient is capable of making decisions on his own behalf. 6. Skin/Wound Care: Routine pressure relief measures.  7. Fluids/Electrolytes/Nutrition: Monitor I/O. Check weekly BMPs plus LFTs on 04/24/22 18. C7 compression Fx: C collar at all times per Dr. Pool.Annette Stable Comminuted distal humerus fracture/olecranon Fx/?radial neck Fx:  S/p ORIF humerus 04/17/22-->NWB RUE  10. HTN/ Syncope: Low dose Avapro 37.5mg Q10mesumed on 02/16--will set hold parameters. --Monitor orthostatic BP. Add thigh high TEDs and abdominal binder.  -04/22/22 orthostatics+ during PT eval this morning but Avapro had been given, and TEDs not in place; advised use of TEDs and reminded nursing staff about the Avapro order parameters; monitor -04/23/22 BPs soft but stable, cont to monitor.  Vitals:   04/21/22 1411 04/21/22 1935 04/22/22 0350 04/22/22 0417  BP: (!) 104/53  109/66 108/61 106/66   04/22/22 1346 04/22/22 2025 04/23/22 0441 04/23/22 0807  BP: 98/62 106/65 103/67 92/63   04/23/22 1457 04/23/22 2009 04/24/22 0431 04/24/22 0758  BP: 95/64 (!) 103/55 (!) 112/54 102/72    11. T12-L5 right TP Fx:  TLSO prn 12. H/o gastric bypass: Resume Vitamin B12 IM monthly and vitamins. Intake at baseline.  13. Abdominal pain/Acute on constipation:  -KUB ordered and demonstrates moderate gas in bowels, ?ileus --Took MOM at daily at night-->"nothing else works"--will resume --pt received mag citrate at 0930 and SMOG enema at 1130-->large results at 1300 today --continue Miralax 17g TID and colace 100mg b57mdulcolax supp PRN, fleet's PRN             -SS enema in AM if no further bm's.  -04/22/22 LBM yesterday x3, abd pain improved, no n/v reported; monitor on current regimen -04/23/22 no BM since 2/16 but refused miralax a few times-- feels he might have one today, advised not missing meds today and also ordered sorbitol 30ml PR64m case he needs it-- discussed he will likely need enema if he doesn't have a BM by tomorrow  -2/19 order sorbitol 30ml 14.26mnary retention: Flomax increased to 0.8 mg today-->change to HS as continues to almost black out at EOB per pt/wife.              --monitor voiding with bladder scans/PVR checks.  -04/22/22 PVRs not being documented, required ISC last night, urinating better this morning; monitor  -2/19 appears to be continent and urinating better. 15. Soft tissue hematoma/ABLA: H/H with downward trend. Recheck CBC in am             --transfuse prn Hgb<7.0 or if symptomatic.  -04/22/22 Hgb 7.8, remaining stable (9.4>8.7>7.8>7.7>7.4>7.8), continue to monitor  -2/19 Hgb stable 8.7 16. Hyponatremia: 133-135 prior to admission. Monitor on weekly labs starting 04/24/22  -2/19 sodium appears to be stable at 133 17.  Orthostatic hypotension.  Patient reports he is drinking fluids.  BP appeared to be more stable with Ace wrap's- continue  this.  LOS: 3 days A FACE TO FACE EVALUATION WAS PERFORMED  Jennye Boroughs 04/24/2022, 8:21 AM

## 2022-04-24 NOTE — Progress Notes (Signed)
Inpatient Rehabilitation  Patient information reviewed and entered into eRehab system by Maxime Beckner Jayzon Taras, OTR/L, Rehab Quality Coordinator.   Information including medical coding, functional ability and quality indicators will be reviewed and updated through discharge.   

## 2022-04-24 NOTE — IPOC Note (Signed)
Overall Plan of Care [facility]) Patient Details Name: Bradley [name] MRN: [id number] DOB: March 12, 1970  Admitting Diagnosis: Trauma  Hospital Problems: Principal Problem:   Trauma     Functional Problem List: Nursing Bladder, Bowel, Endurance, Medication Management, Pain, Safety  PT Balance, Edema, Endurance, Motor, Pain, Safety  OT Balance, Endurance, Pain, Edema  SLP    TR         Basic ADL's: OT Bathing, Dressing, Toileting     Advanced  ADL's: OT       Transfers: PT Bed Mobility, Bed to Chair, Car  OT Toilet     Locomotion: PT Ambulation, Stairs     Additional Impairments: OT None  SLP        TR      Anticipated Outcomes Item Anticipated Outcome  Self Feeding independent  Swallowing      Basic self-care  min A bathing and dressing  Toileting  min toileting   Bathroom Transfers supervision  Bowel/Bladder  manage bladder w mod I assist and bowel with mod I assist  Transfers  Supervision  Locomotion  Supervision  Communication     Cognition     Pain  < 4 with prns  Safety/Judgment  manage w cues   Therapy Plan: PT Intensity: Minimum of 1-2 x/day ,45 to 90 minutes PT Frequency: 5 out of 7 days PT Duration Estimated Length of Stay: 10-14 days OT Intensity: Minimum of 1-2 x/day, 45 to 90 minutes OT Frequency: 5 out of 7 days OT Duration/Estimated Length of Stay: 10-12 days     Team Interventions: Nursing Interventions Pain Management, Patient/Family Education, Bladder Management, Bowel Management, Medication Management, Discharge Planning  PT interventions Ambulation/gait training, Balance/vestibular training, Cognitive remediation/compensation, Community reintegration, Discharge planning, Disease management/prevention, DME/adaptive equipment instruction, Functional electrical stimulation, Functional mobility training, Neuromuscular re-education, Pain management, Patient/family education, Psychosocial support, Skin care/wound management,  Splinting/orthotics, Stair training, Therapeutic Activities, Therapeutic Exercise, UE/LE Strength taining/ROM, UE/LE Coordination activities, Wheelchair propulsion/positioning  OT Interventions Balance/vestibular training, Cognitive remediation/compensation, Discharge planning, DME/adaptive equipment instruction, Functional mobility training, Pain management, Patient/family education, Psychosocial support, Self Care/advanced ADL retraining, Therapeutic Activities, Therapeutic Exercise  SLP Interventions    TR Interventions    SW/CM Interventions Discharge Planning, Psychosocial Support, Patient/Family Education   Barriers to Discharge MD  Medical stability, Home enviroment access/loayout, and Weight bearing restrictions  Nursing Decreased caregiver support, Home environment access/layout, Weight bearing restrictions multi level 4 ste bil rail w spouse; main B+B  PT Home environment access/layout, Insurance for SNF coverage, Weight, Weight bearing restrictions, Other (comments) (Pain Control)    OT      SLP      SW Weight bearing restrictions, Insurance for SNF coverage     Team Discharge Planning: Destination: PT-Home ,OT- Home , SLP-  Projected Follow-up: PT-Home health PT, Outpatient PT, 24 hour supervision/assistance, OT-  Home health OT, SLP-  Projected Equipment Needs: PT-To be determined, OT- Tub/shower seat, SLP-  Equipment Details: PT- , OT-  Patient/family involved in discharge planning: PT- Patient, Family member/caregiver,  OT- patient and spouse, SLP-   MD ELOS: 10-12 Medical Rehab Prognosis:  Good Assessment: The patient has been admitted for CIR therapies with the diagnosis of polytrauma after falling through a ceiling . The team will be addressing functional mobility, strength, stamina, balance, safety, adaptive techniques and equipment, self-care, bowel and bladder mgt, patient and caregiver education. Goals have been set at supervision to mod I with PT, OT . Anticipated  discharge destination is home.  See Team Conference Notes for weekly updates to the plan of care

## 2022-04-24 NOTE — Progress Notes (Signed)
Physical Therapy Session Note  Patient Details  Name: Bradley [name] MRN: [id number] Date of Birth: 1970/05/15  Today's Date: 04/24/2022 PT Individual Time: [id number] PT Individual Time Calculation (min): 70 min   Short Term Goals: Week 1:  PT Short Term Goal 1 (Week 1): Pt will complete bed mobility with minA PT Short Term Goal 2 (Week 1): Pt will complete bed<>chair transfers with CGA and LRAD PT Short Term Goal 3 (Week 1): Pt will ambulate 57f with minA and LRAD PT Short Term Goal 4 (Week 1): Pt will initiate stair training  Skilled Therapeutic Interventions/Progress Updates:    Chart reviewed and pt agreeable to therapy. Pt received seated EOB with 9/10 c/o pain diffuse t/o body. Session focused on ambulation safety, AD use, and balance to promote safe home access. Pt initiated session with discussion regarding PLOF and periodic cane use. Pt stated he may feel more comfortable with cane support. Thus, PT retrieved a LBQC. The pt then stood and amb 134fin room using CGA + LBQC. Pt reported more confidence walking with cane. Pt then amb to/from toilet with CGA + LBQC and CGA for standing during urination. Pt returned to w/c and was trasnfered to first floor for time management. On first floor, pt completed block practice of amb safety awareness with pt tasked to amb to/from w/c and return with safe energy level to avoid fall. Pt demonstrated ability to judge safety limitation, but required w/c brought to him for over reach of amb distance. Overall, pt amb 50 + 50 ft with CGA + LBQC. Pt then completed balance exercises of 2x30 neutral stance, narrow stance, and neutral + eyes closed all with CGA. Session education emphasized precautions. Pt returned to room and required CGA to return to bed. At end of session, pt was left semi-reclined in bed with alarm engaged, nurse call bell and all needs in reach.     Therapy Documentation Precautions:  Precautions Precautions: Fall, Cervical,  Back Required Braces or Orthoses: Cervical Brace, Spinal Brace, Sling, Splint/Cast Cervical Brace: Hard collar, At all times Spinal Brace: Thoracolumbosacral orthotic (for comfort) Splint/Cast: R long arm Restrictions Weight Bearing Restrictions: Yes RUE Weight Bearing: Non weight bearing     Therapy/Group: Individual Therapy  KiMarquette OldPT, DPT 04/24/2022, 12:07 PM

## 2022-04-24 NOTE — Progress Notes (Signed)
Occupational Therapy Session Note  Patient Details  Name: Bradley [name] MRN: [id number] Date of Birth: 09/18/1970  Session 1 Today's Date: 04/24/2022 OT Individual Time: [id number] OT Individual Time Calculation (min): 70 min   Session 2  Today's Date: 04/24/2022 OT Individual Time: 1410-1507 OT Individual Time Calculation (min): 57 min    Short Term Goals: Week 1:     Skilled Therapeutic Interventions/Progress Updates:  Session 1   Pt received sitting up with 7/10 pain in his RUE and back, agreeable to OT session. Discussed pain management, rehab expectations, and OT POC. Skilled monitoring of vitals throughout session to ensure hemodynamic and cardiorespiratory stability. Extra time required for [facility] monitoring and management. Ended up trialing BP stability with thigh high teds, added ace wrap under knee, and finally ace wrap up to his thighs, which was successful. He completed 8+ sit <> stands with CGA from the recliner during this trialing. He completed 100 ft of functional mobility with CGA, initially HHA and then with his LUE supporting his RUE. He took a standing rest break for BP assessment- which was stable at 117/67. Pt was asymptomatic throughout all BP readings, hence the close monitoring. Provided extensive education on BP control to pt and his wife who was supportive and present throughout session. He returned to his room. He sat EOB and his ice packs were replenished. PT then entered for earlier than scheduled session.    BP: seated- 105/76 Standing- 81/64 Standing with ted hose: 86/62 Standing  with double ace wrap: 103/87   Session 2 Pt received supine with no c/o pain, agreeable to OT session. He completed bed mobility to EOB with CGA, HOB elevated. Pt completed sit > stand with CGA. Pt completed functional mobility with CGA around the room with only thigh high teds on. He reported no s/s of OH throughout. [place] ADLs performed with focus on adaptive dressing and  bathing techniques. Pt required min A for UB bathing and mod A to don his shirt d/t bulky R elbow splint. Mod A to don shorts and underwear d/t preference with wife helping. He was able to bathe peri areas in standing with CGA. All transfers at [facility] level today. Applied foam dressing under lateral aspects of cervical collar d/t skin red with foam not covering area. He stood for BP reading following- thigh high teds- 86/67. He returned to the recliner and was left sitting up with all needs met.  Checked off his wife for in room transfers but reiterated that ace wraps need to be on for mobility.    Therapy Documentation Precautions:  Precautions Precautions: Fall, Cervical, Back Required Braces or Orthoses: Cervical Brace, Spinal Brace, Sling, Splint/Cast Cervical Brace: Hard collar, At all times Spinal Brace: Thoracolumbosacral orthotic (for comfort) Splint/Cast: R long arm Restrictions Weight Bearing Restrictions: Yes RUE Weight Bearing: Non weight bearing  Therapy/Group: Individual Therapy  [name] 04/24/2022, 6:42 AM

## 2022-04-25 DIAGNOSIS — F1120 Opioid dependence, uncomplicated: Secondary | ICD-10-CM

## 2022-04-25 DIAGNOSIS — G894 Chronic pain syndrome: Secondary | ICD-10-CM

## 2022-04-25 DIAGNOSIS — I10 Essential (primary) hypertension: Secondary | ICD-10-CM

## 2022-04-25 MED ORDER — BACLOFEN 5 MG HALF TABLET
5.0000 mg | ORAL_TABLET | Freq: Three times a day (TID) | ORAL | Status: DC
  Administered 2022-04-25 – 2022-05-02 (×21): 5 mg via ORAL
  Filled 2022-04-25 (×21): qty 1

## 2022-04-25 MED ORDER — TRAZODONE HCL 50 MG PO TABS
50.0000 mg | ORAL_TABLET | Freq: Every day | ORAL | Status: DC
  Administered 2022-04-25 – 2022-05-01 (×7): 50 mg via ORAL
  Filled 2022-04-25 (×7): qty 1

## 2022-04-25 MED ORDER — METAXALONE 800 MG PO TABS
400.0000 mg | ORAL_TABLET | Freq: Four times a day (QID) | ORAL | Status: DC
  Administered 2022-04-25 – 2022-04-27 (×7): 400 mg via ORAL
  Filled 2022-04-25 (×2): qty 1
  Filled 2022-04-25 (×6): qty 0.5

## 2022-04-25 NOTE — Progress Notes (Signed)
Occupational Therapy Session Note  Patient Details  Name: Bradley [name] MRN: [id number] Date of Birth: 07-Oct-1970  Today's Date: 04/25/2022 OT Individual Time: 1100-1200 OT Individual Time Calculation (min): 60 min    Short Term Goals: Week 1:  OT Short Term Goal 1 (Week 1): STG= LTG d/t ELOS  Skilled Therapeutic Interventions/Progress Updates:     Pt received in w/c with wife present and unrated rib pain. BP checked throughout session to monitor for [facility] which is present throughout session 100/80s in sitting and 70-80s/50s in standing despite adding compression and ACE wraps. Pt finishes grooming at sink with MIN A for picking off old adhesive from dentures. Pt completes 5 min on the Nustep level 3 increasing to 5 with cuing to keep steps per min > 30 to improve cardiovascular endurance, reciprocal movement training and activity tolerance in prep for BADLs and functional transfers. No improvements in [facility] after exercise. Messaged about K pad to asisst with pain management. Provided light resistance sponge for RUE.   Pt left at end of session in recliner with wife & daughter present, call light in reach and all needs met   Therapy Documentation Precautions:  Precautions Precautions: Fall, Cervical, Back Required Braces or Orthoses: Cervical Brace, Spinal Brace, Sling, Splint/Cast Cervical Brace: Hard collar, At all times Spinal Brace: Thoracolumbosacral orthotic (for comfort) Splint/Cast: R long arm Restrictions Weight Bearing Restrictions: Yes RUE Weight Bearing: Non weight bearing    Therapy/Group: Individual Therapy  Tonny Branch 04/25/2022, 12:06 PM

## 2022-04-25 NOTE — Consult Note (Signed)
Neuropsychological Consultation   Patient:   Bradley [name]   DOB:   [birth year]  MR Number:  [id number]  Location:  [facility] [address] C[facility] [address] [place] [id number] [place] [address] Dept: [place]: [phone]           Date of Service:   04/25/2022  Start Time:   2 PM End Time:   3 PM  Provider/Observer:  Ilean Skill, Psy.D.       Clinical Neuropsychologist       Billing Code/Service: [phone]  Reason for Service:    Bradley [name] is a 52 year old male who is referred for neuropsychological consultation due to coping and adjustment issues and complications of his acute polytrauma with his longstanding chronic pain symptoms and lumbar spine issues.  Patient has a past medical history that includes hypertension, chronic back pain, gastric bypass.  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  Patient was found to have right rib fracture 2-12 with small right pneumothorax, right communicated scapula fracture, right humerus fracture, radial neck fracture, lower back hematoma, right T12-L5 right TP fracture and ABLA.  Patient was made nonweightbearing.  MRI C-spine showed mild compression fracture of C7-T3, T4 and T5.  Patient underwent ORIF right distal humerus, open reduction of humeral shaft with internal fixation and right ulnar nerve neurolysis with anterior transposition on 04/17/2022.  Postoperatively he was nonweightbearing.  Patient did have episode of abnormal LFTs resolving and ABLA being monitored.  Early on when therapy was working with the patient he had syncopal with unresponsiveness and bradycardia on 2/12.  Pain control has been an issue as the patient had previously been on regular opiate medications due to his chronic back pain.  Issues have been present for low blood pressure more recently with the patient having his dark high blood  pressure.  CIR was recommended due to functional decline.   Below is the HPI for the current admission for convenience:  HPI: Bradley [name]. 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 He was found to have right rib Fx 2-12 with small R-PTX, right comminuted scapula Fx, right humerus Fx, radial neck Fx, lower back hematoma, right T12-L5 right TP Fx and ABLA. He was placed in long arm splint and made NWB. MRI C spine showed mild compression Fx of C7, T3, T4 and T5. C collar to continue with TLSO for comfort prn per NS. Dr. Griffin Basil consulted and patient underwent ORIF right distal humerus, open reduction of humeral shaft with internal fixation and right ulna nerve neurolysis with anterior transposition on 04/17/22. Post op to be NWB and dressings to be left intact till follow up.    Abnormal LFTs resolving and ABLA being monitored. Therapy has been working with patient who had syncope with unresponsiveness and bradycardia on 02/13. Pain control has been an issues and oxycontin added and being titrated up for better control. He has had issues with constipation therefore treated with multiple laxatives/enema today.   Flomax increased to 0.8 mg due to retention requiring I/O cath for 500 cc yesterday. Therapy has been working with patient who continues to be limited by weakness, pain,orthostatic symptoms with syncope/presyncope. CIR recommended due to functional decline.    Current Status:  Patient was awake with hard  neck collar in place laying in his bed with his head surrounded by pillows.  Patient's wife was present in the room as well.  Patient was awake and alert and aware of what has been going on with him medically and spent some time talking about syncopal episodes and concern around low blood pressure.  Patient was open about his chronic pain and regular use of  opiate pain medications on a long-term basis.  Patient reported there is been times in the past where attempts were made to stop the opiate medications both by his neurosurgeon as well as his physician following him for pain Dr. Dossie Der.  Patient attempted Suboxone previously but his pain was not controlled.  Patient has a lot of worry about what will happen at discharge with regard to his pain levels.  Patient is aware of his dependence upon opiate type pain medicines but is careful to follow prescription guidelines and has not had any issues with overuse or misuse.  Patient reports good response to ketamine during initial EMS care after his fall around pain but acknowledges after the third administration of ketamine that he began to dissociate and has little recall about those events and patient's wife reports that he was clearly in an altered state but was not so in the first 2 administrations.  Patient did report that his pain was benefited in a very specific way that was different than he had experience from other opiate type pain medications.  Considerable time was spent discussing long-term plans post discharge around continuing to look at other ways to manage his pain outside of opiate-based medications.  Behavioral Observation: Bradley [name]  presents as a 52 y.o.-year-old Right handed Caucasian Male who appeared his stated age. his dress was Appropriate and he was Well Groomed and his manners were Appropriate to the situation.  his participation was indicative of Appropriate, Inattentive, and Redirectable behaviors.  There were physical disabilities noted.  he displayed an appropriate level of cooperation and motivation.    Interactions:    Active Redirectable  Attention:   abnormal and attention span appeared shorter than expected for age  Memory:   within normal limits; recent and remote memory intact  Visuo-spatial:  not examined  Speech (Volume):  low  Speech:   normal; normal  Thought  Process:  Coherent and Relevant  Though Content:  WNL; not suicidal and not homicidal  Orientation:   person, place, time/date, and situation  Judgment:   Fair  Planning:   Fair  Affect:    Anxious  Mood:    Anxious  Insight:   Good  Intelligence:   normal  Medical History:   Past Medical History:  Diagnosis Date   Allergy    Anxiety    Aortic valve sclerosis    Arthritis    Arthropathy, unspecified, other specified sites    CAD (coronary artery disease)    01/09/20: patient denied; faint coronary calcifications on 2016 CT   Depression    GERD (gastroesophageal reflux disease)    Heart murmur    mild MR, mild AS 06/2019 (Dr. [name])   History of kidney stones    Hypertension     " no longer have hypertension since gastric bypass "   Obesity, unspecified    Other, mixed, or unspecified nondependent drug abuse, unspecified    Personal history of urinary calculi    Priapism          Patient Active Problem List   Diagnosis Date  Noted   Trauma 04/21/2022   Fall 04/15/2022   Candida infection, oral 03/29/2022   Pain in joint of left knee 03/14/2022   Gastric bypass status for obesity 12/18/2021   Multiple gallstones 12/18/2021   Class 2 severe obesity due to excess calories with serious comorbidity and body mass index (BMI) of 35.0 to 35.9 in adult [facility]) 12/16/2021   Erectile dysfunction due to arterial insufficiency 12/14/2021   Epigastric abdominal pain 12/14/2021   Encounter for general adult medical examination with abnormal findings 12/12/2021   Generalized excess and redundant skin after bariatric surgery 06/16/2021   Class 1 obesity due to excess calories with serious comorbidity and body mass index (BMI) of 32.0 to 32.9 in adult 06/15/2021   Benign prostatic hyperplasia without lower urinary tract symptoms 04/22/2019   Gastroesophageal reflux disease without esophagitis 04/19/2018   Thiamine deficiency 04/14/2018   Other cirrhosis of liver ([place])  03/13/2018   NASH (nonalcoholic steatohepatitis) 03/13/2018   Low libido 01/28/2018   Hyperlipidemia LDL goal <130 10/25/2017   Insomnia secondary to chronic pain 06/19/2017   Osteoarthritis of left knee 04/26/2017   Degenerative spondylolisthesis 02/24/2016   Routine general medical examination at a health care facility 06/08/2015   Cough variant asthma 02/06/2015   Zinc deficiency 04/12/2014   B12 deficiency anemia 04/08/2014   Vitamin D deficiency 07/26/2012   History of gastric bypass 07/25/2012   Anemia, iron deficiency 07/25/2012   Status post bariatric surgery 07/25/2012   NARCOTIC ABUSE 03/25/2009   EXOGENOUS OBESITY 08/22/2007   Osteoarthritis of right knee 03/12/2007   Essential hypertension 10/08/2006   BACK PAIN, LUMBAR 10/08/2006     Psychiatric History:  Patient has a history of some anxiety, insomnia secondary to chronic pain and chronic pain disorder.  Family Med/Psych History:  Family History  Problem Relation Age of Onset   Arthritis Mother    Colon cancer Mother    Breast cancer Mother        twice   Kidney failure Mother    Heart disease Father    Hypertension Father    Esophageal cancer Neg Hx    Inflammatory bowel disease Neg Hx    Liver disease Neg Hx    Pancreatic cancer Neg Hx    Stomach cancer Neg Hx    Rectal cancer Neg Hx     Impression/DX:  Bradley [name] is a 52 year old male who is referred for neuropsychological consultation due to coping and adjustment issues and complications of his acute polytrauma with his longstanding chronic pain symptoms and lumbar spine issues.  Patient has a past medical history that includes hypertension, chronic back pain, gastric bypass.  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  Patient was found to have right rib fracture 2-12 with small right pneumothorax, right communicated scapula fracture, right humerus fracture, radial neck fracture, lower  back hematoma, right T12-L5 right TP fracture and ABLA.  Patient was made nonweightbearing.  MRI C-spine showed mild compression fracture of C7-T3, T4 and T5.  Patient underwent ORIF right distal humerus, open reduction of humeral shaft with internal fixation and right ulnar nerve neurolysis with anterior transposition on 04/17/2022.  Postoperatively he was nonweightbearing.  Patient did have episode of abnormal LFTs resolving and ABLA being monitored.  Early on when therapy was working with the patient he had syncopal with unresponsiveness and bradycardia on 2/12.  Pain control has been an issue as the patient had previously been on regular  opiate medications due to his chronic back pain.  Issues have been present for low blood pressure more recently with the patient having his dark high blood pressure.  CIR was recommended due to functional decline.  Patient was awake with hard neck collar in place laying in his bed with his head surrounded by pillows.  Patient's wife was present in the room as well.  Patient was awake and alert and aware of what has been going on with him medically and spent some time talking about syncopal episodes and concern around low blood pressure.  Patient was open about his chronic pain and regular use of opiate pain medications on a long-term basis.  Patient reported there is been times in the past where attempts were made to stop the opiate medications both by his neurosurgeon as well as his physician following him for pain Dr. Dossie Der.  Patient attempted Suboxone previously but his pain was not controlled.  Patient has a lot of worry about what will happen at discharge with regard to his pain levels.  Patient is aware of his dependence upon opiate type pain medicines but is careful to follow prescription guidelines and has not had any issues with overuse or misuse.  Patient reports good response to ketamine during initial EMS care after his fall around pain but acknowledges after the  third administration of ketamine that he began to dissociate and has little recall about those events and patient's wife reports that he was clearly in an altered state but was not so in the first 2 administrations.  Patient did report that his pain was benefited in a very specific way that was different than he had experience from other opiate type pain medications.  Considerable time was spent discussing long-term plans post discharge around continuing to look at other ways to manage his pain outside of opiate-based medications.  Disposition/Plan:  Today we worked on coping issues particular around his chronic pain and the challenges that exist regarding management of his acute exacerbation of pain from polytrauma in the setting of previous long-term maintenance opiate use.  Patient acknowledges a worsening of his anxiety and he has been fairly apprehensive about  Diagnosis:    No diagnosis found.  Electronically Signed   _______________________ Ilean Skill, Psy.D. Clinical Neuropsychologist

## 2022-04-25 NOTE — Progress Notes (Signed)
Patient ID: Bradley [name], male   DOB: [birth year], 52 y.o.   MRN: [id number] Follow up with patient and wife to review situation, team conference and plan of care. Patient reporting difficulty managing pain; using various options, lidocaine patches, etc. Also reports not sleeping well even with trazodone and melatonin. Soreness all over, bruises popping up in different places. Notes voiding on his own. Understands need for collar all of the time and sling prn. Forwarded concerns about insomnia to PA/MD and check on cast before discharge. Asking about hospital bed, equipment for home. Forwarded concerns to the SW/team. Continue to follow along to address educational needs to facilitate preparation for discharge. [name]

## 2022-04-25 NOTE — Progress Notes (Signed)
PROGRESS NOTE   Subjective/Complaints:  Seen at beside. Ace bandage was adjusted and is fitting better. Continues to have pain-medications helping. Poor sleep last night. Continues to have orthostatic hypotension-denies symptoms.   ROS: +pain-multiple locations, mostly back/ribs-improving, +continued insomnia +intermittent urinary retention-improving. Denies fevers, chills, CP, SOB, abd pain, cough, N/V/D/C, new/worsening paresthesias/weakness, or any other complaints at this time.     Objective:   No results found. Recent Labs    04/24/22 0651  WBC 5.1  HGB 8.7*  HCT 25.8*  PLT 344    Recent Labs    04/24/22 0651  NA 133*  K 3.9  CL 97*  CO2 23  GLUCOSE 125*  BUN 16  CREATININE 0.91  CALCIUM 8.7*     Intake/Output Summary (Last 24 hours) at 04/25/2022 0819 Last data filed at 04/25/2022 0710 Gross per 24 hour  Intake 834 ml  Output 950 ml  Net -116 ml         Physical Exam: Vital Signs Blood pressure 102/65, pulse 80, temperature 97.8 F (36.6 C), resp. rate 19, height 5' 10"$  (1.778 m), weight 111.5 kg, SpO2 100 %.  Constitutional:  NAD, happier than yesterday. Sitting in chair. Slightly pale but similar to yesterday.  HENT: [place]/AT, MMM Eyes: EOMI, PERRL Neck: Immobilized with C- collar Cardiovascular: RRR, holosystolic murmur heard best at LUSB, no rub/gallops, distal pulses intact Pulmonary: CTAB, no w/r/r Abdominal: soft, mildly distended, nonTTP, no r/g/r, +BS throughout although slightly hypoactive-perhaps slightly more active than yesterday but still fairly quiet.  Extremities: RUE splinted and in sling.  Prior exam: Musculoskeletal: Right chest/flank pain with attempts at ROM RLE.  R arm immobilized in splint/sling. Ace wraps have been adjusted- fitting better. RUE 1+ edema, LUE 1+ edema  Skin:    General: Skin is warm.     Comments: Tattoo left arm. Scattered abrasions, bruises. RUE  dressed/splinted  Neurological:     Mental Status: He is alert and oriented to person, place, and time.     Comments: Alert and oriented x 3. Normal insight and awareness. Intact Memory. Normal language and speech. Cranial nerve exam unremarkable. RUE limited d/t ortho LUE 4/5. RLE 2/5 prox to 4+/5 distally. LLE 3+/5 prox to 5/5 distally. No focal sensory loss appreciated. No abnl resting tone.   Psychiatric:     Comments: Pt a little flat but generally cooperative and appropriate.    Assessment/Plan: 1. Functional deficits which require 3+ hours per day of interdisciplinary therapy in a comprehensive inpatient rehab setting. Physiatrist is providing close team supervision and 24 hour management of active medical problems listed below. Physiatrist and rehab team continue to assess barriers to discharge/monitor patient progress toward functional and medical goals  Care Tool:  Bathing    Body parts bathed by patient: Chest, Abdomen, Face   Body parts bathed by helper: Right arm, Left arm, Front perineal area, Buttocks, Right upper leg, Left upper leg, Left lower leg, Right lower leg     Bathing assist Assist Level: Maximal Assistance - Patient 24 - 49%     Upper Body Dressing/Undressing Upper body dressing   What is the patient wearing?: Pull over shirt    Upper  body assist Assist Level: Total Assistance - Patient < 25%    Lower Body Dressing/Undressing Lower body dressing      What is the patient wearing?: Underwear/pull up, Pants     Lower body assist Assist for lower body dressing: Total Assistance - Patient < 25%     Toileting Toileting    Toileting assist Assist for toileting: Total Assistance - Patient < 25%     Transfers Chair/bed transfer  Transfers assist     Chair/bed transfer assist level: Minimal Assistance - Patient > 75%     Locomotion Ambulation   Ambulation assist      Assist level: Minimal Assistance - Patient > 75% Assistive device:  Hand held assist Max distance: 95FT   Walk 10 feet activity   Assist  Walk 10 feet activity did not occur: Safety/medical concerns  Assist level: Minimal Assistance - Patient > 75% Assistive device: Hand held assist   Walk 50 feet activity   Assist Walk 50 feet with 2 turns activity did not occur: Safety/medical concerns  Assist level: Minimal Assistance - Patient > 75% Assistive device: Hand held assist    Walk 150 feet activity   Assist Walk 150 feet activity did not occur: Safety/medical concerns         Walk 10 feet on uneven surface  activity   Assist Walk 10 feet on uneven surfaces activity did not occur: Safety/medical concerns         Wheelchair     Assist Is the patient using a wheelchair?: Yes Type of Wheelchair: Manual    Wheelchair assist level: Dependent - Patient 0%      Wheelchair 50 feet with 2 turns activity    Assist        Assist Level: Dependent - Patient 0%   Wheelchair 150 feet activity     Assist      Assist Level: Dependent - Patient 0%   Blood pressure 102/65, pulse 80, temperature 97.8 F (36.6 C), resp. rate 19, height 5' 10"$  (1.778 m), weight 111.5 kg, SpO2 100 %.  Medical Problem List and Plan: 1. Functional deficits secondary to polytrauma after falling through a ceiling             -patient may shower if RUE is covered and pt can tolerate             -ELOS/Goals: 10-12 days, supervision to mod I with PT, OT.   -Continue CIR  -Team conference tomorrow 2.  Antithrombotics: -DVT/anticoagulation:  Pharmaceutical: Lovenox 41m q12h             -antiplatelet therapy: N/A 3. Chronic pain/Pain Management: Tylenol 650 mg qid + 3262mq6h PRN, Lidoderm 2patches QD - continue Oxycontin 12hr 30 mg BID             --using oxycodone 15 mg TID (was on oxycodone 10 mg 5x/day)  -pt told me he was wanting to wean off narcotics prior to this admission -04/22/22 pt with increasing pain d/t PRN dosing; agreeable to  change Oxycodone IR to 1034m4h scheduled with 5mg54mh PRN-- this will actually end up giving him slightly less on a schedule than what he's been getting with the PRNs, but still leave a PRN available if he needs it; monitor if this works better, may want to consider alternative pain control method given his tolerance to oxycodone (?nucynta or other newer opioid med)-- defer to weekday team -04/23/22 much better control of pain with scheduled dosing-- not  using much PRN; monitor  -04/24/22 Tramadol PRN was started today for mild pain  -2/20 Decreased skelaxin dose, stop robaxin and start baclofen, Kpad, discussed plan to wean oxycodone --neuropathic pain: Gabapentin up to 900 mg TID now. Cymbalta 20 mg bid --also on Skelaxin 842m QID and robaxin 10029mTID-->back spasms controlled on this.  4. Mood/Behavior/Sleep: LCSW to follow for evaluation and support.              --trazodone prn for insomnia -04/22/22 changed trazodone to scheduled 2520mHS+2m23mN and added scheduled Melatonin 5mg 4m -2/20 increase trazodone dose to 50mg 31mduled             -antipsychotic agents: N/A 5. Neuropsych/cognition: This patient is capable of making decisions on his own behalf. 6. Skin/Wound Care: Routine pressure relief measures.  7. Fluids/Electrolytes/Nutrition: Monitor I/O. Check weekly BMPs plus LFTs on 04/24/22 18. C7 compression Fx: C collar at all times per Dr. Pool. Annette StableComminuted distal humerus fracture/olecranon Fx/?radial neck Fx:  S/p ORIF humerus 04/17/22-->NWB RUE  10. HTN/ Syncope: Low dose Avapro 37.5mg QD26msumed on 02/16--will set hold parameters. --Monitor orthostatic BP. Add thigh high TEDs and abdominal binder.  -04/22/22 orthostatics+ during PT eval this morning but Avapro had been given, and TEDs not in place; advised use of TEDs and reminded nursing staff about the Avapro order parameters; monitor -2/20 stable, monitor Vitals:   04/22/22 2025 04/23/22 0441 04/23/22 0807 04/23/22 1457  BP:  106/65 103/67 92/63 95/64 $   04/23/22 2009 04/24/22 0431 04/24/22 0758 04/24/22 1310  BP: (!) 103/55 (!) 112/54 102/72 110/70   04/24/22 2032 04/25/22 0622 04/25/22 0624 04/25/22 0626  BP: 104/66 114/69 115/67 102/65    11. T12-L5 right TP Fx:  TLSO prn 12. H/o gastric bypass: Resume Vitamin B12 IM monthly and vitamins. Intake at baseline.  13. Abdominal pain/Acute on constipation:  -KUB ordered and demonstrates moderate gas in bowels, ?ileus --Took MOM at daily at night-->"nothing else works"--will resume --pt received mag citrate at 0930 and SMOG enema at 1130-->large results at 1300 today --continue Miralax 17g TID and colace 100mg bi48mulcolax supp PRN, fleet's PRN             -SS enema in AM if no further bm's.  -04/22/22 LBM yesterday x3, abd pain improved, no n/v reported; monitor on current regimen -04/23/22 no BM since 2/16 but refused miralax a few times-- feels he might have one today, advised not missing meds today and also ordered sorbitol 30ml PRN51mcase he needs it-- discussed he will likely need enema if he doesn't have a BM by tomorrow  -2/19 order sorbitol 30ml  2/283mM yesterday 14. Urinary retention: Flomax increased to 0.8 mg today-->change to HS as continues to almost black out at EOB per pt/wife.              --monitor voiding with bladder scans/PVR checks.  -04/22/22 PVRs not being documented, required ISC last night, urinating better this morning; monitor  -2/19 appears to be continent and urinating better. 15. Soft tissue hematoma/ABLA: H/H with downward trend. Recheck CBC in am             --transfuse prn Hgb<7.0 or if symptomatic.  -04/22/22 Hgb 7.8, remaining stable (9.4>8.7>7.8>7.7>7.4>7.8), continue to monitor  -2/19 Hgb stable 8.7 16. Hyponatremia: 133-135 prior to admission. Monitor on weekly labs starting 04/24/22  -2/19 sodium appears to be stable at 133 17.  Orthostatic hypotension.  Patient reports he is drinking fluids.  BP appeared to be more stable  with Ace wrap's- continue this.  -Consider additional medication if interferes with therapy  LOS: 4 days A FACE TO FACE EVALUATION WAS PERFORMED  Jennye Boroughs 04/25/2022, 8:19 AM

## 2022-04-25 NOTE — Progress Notes (Signed)
Patient ID: Bradley [name], male   DOB: 1971-01-03, 52 y.o.   MRN: [id number]  Gave completed FMLA medical portion form back to wife who was present in pt's room. If she wants worker to fax or email she will let me know.

## 2022-04-25 NOTE — Progress Notes (Signed)
Occupational Therapy Session Note  Patient Details  Name: LOVELESS CORRO MRN: [id number] Date of Birth: 05-Sep-1970 [place] Comment t Today's Date: 04/25/2022 OT Individual Time: 1030-1100 OT Individual Time Calculation (min): 30 min    Short Term Goals: Week 1:  OT Short Term Goal 1 (Week 1): STG= LTG d/t ELOS Week 2:     Skilled Therapeutic Interventions/Progress Updates:    1:1 Pt received in bed. Pt able to come to EOB with mod I with bed rail on the left side  of the bed. Pt BP taken and was 95/62 in sitting without symptoms. Pt ambulated to the sink with close supervision BP still the same in standing. Pt participated in bathing and dressing at the sink level. Pt able to bathe sit to stand with supervision. Pt reports right arm is heavy and unable to fully lift it requiring A to lift to access underneath his arm.  A for feet to wash and don socks due to time.  Pt asked about showering and discussed covering his UE in bag and then can shower and change neck pads afterwards in the bed in supine.  Left pt sitting up at the sink to finish fixing hair and brushing teeth.   Therapy Documentation Precautions:  Precautions Precautions: Fall, Cervical, Back Required Braces or Orthoses: Cervical Brace, Spinal Brace, Sling, Splint/Cast Cervical Brace: Hard collar, At all times Spinal Brace: Thoracolumbosacral orthotic (for comfort) Splint/Cast: R long arm Restrictions Weight Bearing Restrictions: Yes RUE Weight Bearing: Non weight bearing General:   Vital Signs:   Pain: Pain Assessment Pain Score: 5  Ongoing pain throughout body ontop of chronic pain - allowed for rest breaks as needed.     Therapy/Group: Individual Therapy  Willeen Cass [facility] 04/25/2022, 11:47 AM

## 2022-04-25 NOTE — Progress Notes (Signed)
Physical Therapy Session Note  Patient Details  Name: Bradley [name] MRN: [id number] Date of Birth: 08-11-1970  Today's Date: 04/25/2022 PT Individual Time: 0830-0940 PT Individual Time Calculation (min): 70 min   Short Term Goals: Week 1:  PT Short Term Goal 1 (Week 1): Pt will complete bed mobility with minA PT Short Term Goal 2 (Week 1): Pt will complete bed<>chair transfers with CGA and LRAD PT Short Term Goal 3 (Week 1): Pt will ambulate 89f with minA and LRAD PT Short Term Goal 4 (Week 1): Pt will initiate stair training  Skilled Therapeutic Interventions/Progress Updates:    Chart reviewed and pt agreeable to therapy. Pt received seated on toilet with diffuse c/o pain not quantified. Session focused on amb endurance, stair navigation, and balance to promote safe home access. Pt initiated session with amb to WC with CGA + LBQC. Pt then transferred to therapy gym for time management. In gym, pt compelted 4 steps with CGA + B handrails. Wife present for education. Pt then transferred to first floor. Pt completed 515f+ 8027f 30 ft amb with CGA + LBQC progressing to supervision + LBQC. Pt also completed block practice of side steps and backwards walking with CGA + LBQC. Pt then returned to room and compelted 2x10 heel raises and balance training of neurtral stance, narrow stance, eyes closed and staggerd stance all with CGA.  At end of session, pt was left seated in recliner with alarm engaged, nurse call bell and all needs in reach.     Therapy Documentation Precautions:  Precautions Precautions: Fall, Cervical, Back Required Braces or Orthoses: Cervical Brace, Spinal Brace, Sling, Splint/Cast Cervical Brace: Hard collar, At all times Spinal Brace: Thoracolumbosacral orthotic (for comfort) Splint/Cast: R long arm Restrictions Weight Bearing Restrictions: Yes RUE Weight Bearing: Non weight bearing General:     Therapy/Group: Individual Therapy  KirMarquette OldT,  DPT 04/25/2022, 12:34 PM

## 2022-04-25 NOTE — Discharge Instructions (Addendum)
Inpatient Rehab Discharge Instructions  Bradley [name] Discharge date and time:  05/02/22  Activities/Precautions/ Functional Status: Activity: no lifting, driving, or strenuous exercise till cleared by MD.  Diet: regular diet Wound Care: keep wound clean and dry.  Contact Dr. Griffin Basil if you develop any problems with your incision/wound--redness, swelling, increase in pain, drainage or if you develop fever or chills.    Functional status:  ___ No restrictions     ___ Walk up steps independently _X__ 24/7 supervision/assistance   ___ Walk up steps with assistance ___ Intermittent supervision/assistance  ___ Bathe/dress independently ___ Walk with walker     _X__ Bathe/dress with assistance ___ Walk Independently    ___ Shower independently ___ Walk with assistance    ___ Shower with assistance _X__ No alcohol     ___ Return to work/school ________  Special Instructions: Wear collar at all times No weight on right arm--less than one lbs for bathing/dressing/meals   COMMUNITY REFERRALS UPON DISCHARGE:    Outpatient: PT  & OT             Agency:[name] [address] [facility] [address]                                Phone: [phone]              Appointment Date/Time:WILL CALL TO SET UP APPOINTMENTS  Medical Equipment/Items Ordered: HOSPITAL BED AND TRANSPORT CHAIR   WIFE HAS ORDERED [place]                                                 Agency/Supplier:[facility]   [phone]    My questions have been answered and I understand these instructions. I will adhere to these goals and the provided educational materials after my discharge from the hospital.  Patient/Caregiver Signature _______________________________ Date __________  Clinician Signature _______________________________________ Date __________  Please bring this form and your medication list with you to all your follow-up doctor's appointments.        POST-OPERATIVE INSTRUCTIONS  WOUND CARE You may change/reinforce the bandage as needed.  Keep incisions clean and dry Use Ice as often as possible for the first 7 days, then as needed for pain relief. Always keep a towel, ACE wrap or other barrier between the cooling unit and your skin.  You may shower. Gently pat the area dry.  Do not soak the shoulder in water or submerge it.  Keep incisions as dry as possible. Do not go swimming in the pool or ocean until 4 weeks after surgery or when otherwise instructed.    EXERCISES Do not lift anything heavier than one pound with your operative arm You are allowed to move your right elbow as much as you feel comfortable  Work on range of motion of your shoulder, elbow, wrist, hand No restrictions for your range of motion   FOLLOW-UP If you develop a Fever (?101.5), Redness or Drainage from the surgical incision site, please call our office to arrange for an evaluation. Please call the office to schedule a follow-up appointment for your first post-operative appointment in 7-10 days    Ophelia Charter MD, MPH Noemi Chapel, PA-C [place] [address] [phone] (fax)

## 2022-04-25 NOTE — Progress Notes (Signed)
Physical Therapy Session Note  Patient Details  Name: Bradley [name] MRN: [id number] Date of Birth: [birth year]  Today's Date: 04/25/2022 PT Individual Time: [id number] PT Individual Time Calculation (min): 28 min   Short Term Goals: Week 1:  PT Short Term Goal 1 (Week 1): Pt will complete bed mobility with minA PT Short Term Goal 2 (Week 1): Pt will complete bed<>chair transfers with CGA and LRAD PT Short Term Goal 3 (Week 1): Pt will ambulate 63f with minA and LRAD PT Short Term Goal 4 (Week 1): Pt will initiate stair training  Skilled Therapeutic Interventions/Progress Updates:    Pt received supine in bed with his wife and daughter present and pt agreeable to therapy session. Pt wearing hard cervical collar throughout session.   Pt already wearing B LE thigh high TED hose and ACE wraps up to his knees. Vitals in supine: BP 93/57 (MAP 69), HR 82bpm   Supine>sitting R EOB, HOB maximally elevated and using bedrail, with heavy min assist to bring trunk upright - pt would likely get OOB on L side more successfully so he can push with L hand to bring trunk upright since R UE is NWBing. Pt/family report they are discussing need for hospital bed at D/C.  Vitals sitting EOB: BP 109/53 (MAP 70), HR 84bpm   Sit<>stands using large based quad cane (LBQC) in L UE throughout with CGA/close supervision for safety. Gait training ~1536fx2 to/from main therapy gym using L UE support on L[facility] CGA for safety - decreased gait speed although achieving reciprocal stepping pattern - only 1x very slight posterior LOB when turning to step back and sit in chair. Vitals after gait to gym: BP 106/60 (MAP 74), HR 83bpm with no reports of symptoms throughout session  Pt reports light sensitivity; therefore, therapist provided him with sunglasses to wear throughout CIR.  Stair navigation training ascending/descending 8 steps (6" height) using L UE support on each HR with CGA for safety - pt self-selecting  step-to pattern leading with L LE on ascent and R LE on descent.   At end of session, pt left seated EOB in the care of his family, bracing/splints in place, and needs in reach. Nursing staff made aware and in agreement with position pt in at end of session.    Therapy Documentation Precautions:  Precautions Precautions: Fall, Cervical, Back Required Braces or Orthoses: Cervical Brace, Spinal Brace, Sling, Splint/Cast Cervical Brace: Hard collar, At all times Spinal Brace: Thoracolumbosacral orthotic (for comfort) Splint/Cast: R long arm Restrictions Weight Bearing Restrictions: Yes RUE Weight Bearing: Non weight bearing   Pain: Nurse present at beginning of session for medication administration.    Therapy/Group: Individual Therapy  CaTawana Scale PT, DPT, NCS, CSRS 04/25/2022, 12:16 PM

## 2022-04-25 NOTE — Progress Notes (Signed)
Orthopedic Tech Progress Note Patient Details:  [name] [birth year] [id number] Was called by RN to fix splint. Patient had been scratching his arm and the plaster was digging into his wrist. Added extra padding to splint and rewrapped splint with ace wrap.  Patient ID: Bradley [name], male   DOB: 06-Aug-1970, 52 y.o.   MRN: [id number]  Chip Boer 04/25/2022, 4:57 PM

## 2022-04-26 DIAGNOSIS — M79601 Pain in right arm: Secondary | ICD-10-CM

## 2022-04-26 DIAGNOSIS — G4709 Other insomnia: Secondary | ICD-10-CM

## 2022-04-26 NOTE — Progress Notes (Signed)
Patient ID: Bradley [name], male   DOB: November 21, 1970, 52 y.o.   MRN: [id number]  Met with pt and wife to discuss team conference update goals of supervision-min and target discharge date 2/27. Wife is concerned about his BP he is not aware when it is dropping which is scary. MD is working on this. Discussed equipment and follow up. They are aware will need to get quad cane on won due to both wheelchair and cane not covered. Will work on discharge needs.

## 2022-04-26 NOTE — Progress Notes (Signed)
Physical Therapy Session Note  Patient Details  Name: Bradley [name] MRN: [id number] Date of Birth: [birth year]  Today's Date: 04/26/2022 PT Individual Time: 0900-0950 and 1435-1535 PT Individual Time Calculation (min): 50 min and 60 min  Short Term Goals: Week 1:  PT Short Term Goal 1 (Week 1): Pt will complete bed mobility with minA PT Short Term Goal 2 (Week 1): Pt will complete bed<>chair transfers with CGA and LRAD PT Short Term Goal 3 (Week 1): Pt will ambulate 25f with minA and LRAD PT Short Term Goal 4 (Week 1): Pt will initiate stair training  Skilled Therapeutic Interventions/Progress Updates: Pt presented in w/c coming out of bathroom with wife agreeable to therapy. Pt states pain well managed at moment, did not rate. Pt fatigued as took shower this am, rest breaks provided throughout session for energy conservation. Pt stood from w/c with [facility]and close supervision. Pt ambulated CGA to main gym. Pt ambulating with decreased self selected gait speed, wide BOS, and intermittent increased lateral leans but no LOB. Pt ascended/descended x 4 steps with CGA a 1 rail. Pt then ambulated across room to empty mat with noted decreased gait speed. Pt only stating fatigue no orthostatic symptoms. BP checked upon sitting 117/75 (89) HR 72. After extended seated rest break pt performed Sit to stand x 5 for BLE strengthening and standing balance. BP checked after Sit to stand 126/87 (98) HR 77. After another seated rest break pt participated in x 1 round of horseshoes for reaching task and dynamic balance, BP remained WNL. Pt then ambulated ~72fback towards room but was unable to complete ambulation to room due to fatigue. Pt transported remaining distance and performed ambualtory transfer back to bed. Pt performed bed mobility with use of bed features and supervision with increased time. Pt left in bed with wife present and current needs met. Pt missed 10 min skilled PT due to fatigue.    Tx2: Pt  presented in bed with wife present agreeable to therapy. Pt states pain 7/10, premedicated. Pt aware of d/c date and pleased that has a goal to work towards. Orthostatics taken as noted below. Pt did not display any symptoms during transitions. Once vitals completed pt transferred to w/c with close supervision and pt transported to day room for time management. Pt then provided with SBHar[facility] Endoscopy Centernd ambulated around nsg station ~16054fith CGA overall. Pt then provided with LBQ[facility] Hospitald ambulated and additional 24f2fth CGA with pt comparing use of LBQC and SBQC. Per observation pt demonstrated good safety with either device however pt stated that he preferred [facility] then ambulated to NuStep and participated in NuStep x 8 min with workloads between 3 - 5 for increased conditioning. Pt able to maintain 40SPM with activity. BP checked after NuStep as  noted below. Pt then performed ambulatory transfer to w/c and transported to high/low table. Pt participated in static standing activity playing Connect Four to monitor. Pt indicating no symptoms until ~5min42mrk with BP checked approx 3 min. Pt then returned to room at end of session and performed ambulatory transfer to bed with supervision. Pt left sitting EOB at end of session with wife present and current needs met.   At start of session Supine 101/65 (75)  Sitting 97/64 (74) Standing 97/66 (74)  After NuStep  Sitting 113/74 (75) Standing 94/70 (78) - asymptomatic  Playing Connect Four After 3 min 104/76 (87) After 5:30 (sitting) 108/73 (83)      Therapy Documentation Precautions:  Precautions Precautions: Fall, Cervical, Back Required Braces or Orthoses: Cervical Brace, Spinal Brace, Sling, Splint/Cast Cervical Brace: Hard collar, At all times Spinal Brace: Thoracolumbosacral orthotic (for comfort) Splint/Cast: R long arm Restrictions Weight Bearing Restrictions: Yes RUE Weight Bearing: Non weight bearing General: PT Amount of Missed Time (min):  10 Minutes Vital Signs:   Pain: Pain Assessment Pain Scale: 0-10 Pain Score: 8  Pain Type: Acute pain Pain Location: Arm Pain Orientation: Right Pain Descriptors / Indicators: Aching Pain Frequency: Constant Pain Onset: On-going Patients Stated Pain Goal: 4 Pain Intervention(s): Medication (See eMAR) Mobility:   Locomotion :    Trunk/Postural Assessment :    Balance:   Exercises:   Other Treatments:      Therapy/Group: Individual Therapy  Ruweyda Macknight 04/26/2022, 1:33 PM

## 2022-04-26 NOTE — Patient Care Conference (Signed)
Inpatient RehabilitationTeam Conference and Plan of Care Update Date: 04/26/2022   Time: 12:20 PM    Patient Name: Bradley [name]      Medical Record Number: [id number]  Date of Birth: 1970-11-11 Sex: Male         Room/Bed: 4M07C/4M07C-01 Payor Info: Payor: [employer] MEDICARE / Plan: [facility] MEDICARE / Product Type: *No Product type* /    Admit Date/Time:  04/21/2022  1:46 PM  Primary Diagnosis:  Trauma  Hospital Problems: Principal Problem:   Trauma Active Problems:   Uncomplicated opioid dependence ([place])   Chronic pain syndrome    Expected Discharge Date: Expected Discharge Date: 05/02/22  Team Members Present: Physician leading conference: Dr. Jennye Boroughs Social Worker Present: Ovidio Kin, LCSW PT Present: Phylliss Bob, PTA;Becky Sax, PT OT Present: Laverle Hobby, OT     Current Status/Progress Goal Weekly Team Focus  Bowel/Bladder   Continent of bowel and bladder; LBM 04/25/22   Remain continent of bowel and bladder.   Assess bowel and bladder needs q shift and PRN.    Swallow/Nutrition/ Hydration               ADL's   Limited mainly by [facility]. Min A UB/LB ADLs, CGA-(S) mobility   (S) mobility, min A ADLs   ADLs, transfers, compensatory techniques, OH management    Mobility   min assist bed mobility using hospital bed features (may need hospital bed at D/C, otherwise try getting OOB on L side), CGA sit<>stand and stand pivot transfers using LBQC in L UE, CGA gait up to 146f using LBQC, CGA 8 steps using L UE support on each HR   supervision overall ambulatory level  activity tolerance, pain management, pt/family education, bed mobility training, transfer training, gait training, stair navigation training    Communication                Safety/Cognition/ Behavioral Observations               Pain   Pain rates pain 6 to 9 on a pain scale 0/10. Patient states he has chronic pain and acute right shoulder and right rib pain. Scheduled and  prn pain medication provided with minimal relief. RUE in splint.   Pain < or equal to 3/10.   Assess pain q shift and PRN and offer pain medication when needed.    Skin   Scattered brusing to extremities.   Pt to remain free from skin breakdown and infection.  Assess skin q shift and PRN      Discharge Planning:  HOme with wife who is taking a FMLA to provide assist to him at discharge. Doing well will be short lenght of stay   Team Discussion: Patient post poly trauma; limited by pain. MD adjusted medications for pain; opiod tolerance and changed spasticity medications.  Insomnia addressed. Discussed cast-splint removal post xray in a week per Ortho. Continues C-collar. Progress limited by orthostatic hypotension and chronic CMC-thumb.  Patient on target to meet rehab goals: yes, currently needs CGA - supervision overall and able to ambulate > 150'.  Manages a shower; seated.  *See Care Plan and progress notes for long and short-term goals.   Revisions to Treatment Plan:  F/U with Ortho on cast-splint to Right UE   Teaching Needs: Safety, medications, skin care, precautions, transfers, toileting, etc.   Current Barriers to Discharge: Decreased caregiver support and Home enviroment access/layout  Possible Resolutions to Barriers: Family education [facility]  Medical Summary Current Status: polytrauma,  hypotension, pain  Barriers to Discharge: Hypotension;Uncontrolled Pain   Possible Resolutions to [employer] Focus: Stop avapro, Adjust pain medications, baclofen started1   Continued Need for Acute Rehabilitation Level of Care: The patient requires daily medical management by a physician with specialized training in physical medicine and rehabilitation for the following reasons: Direction of a multidisciplinary physical rehabilitation program to maximize functional independence : Yes Medical management of patient stability for increased activity  during participation in an intensive rehabilitation regime.: Yes Analysis of laboratory values and/or radiology reports with any subsequent need for medication adjustment and/or medical intervention. : Yes   I attest that I was present, lead the team conference, and concur with the assessment and plan of the team.   [name] 04/26/2022, 2:39 PM

## 2022-04-26 NOTE — Progress Notes (Signed)
Occupational Therapy Session Note  Patient Details  Name: Bradley [name] MRN: [id number] Date of Birth: May 22, 1970  Session 1 Today's Date: 04/26/2022 OT Individual Time: [id number] OT Individual Time Calculation (min): 60 min   Session 2 Today's Date: 04/26/2022 OT Individual Time: 1330-1415 OT Individual Time Calculation (min): 45 min    Short Term Goals: Week 1:  OT Short Term Goal 1 (Week 1): STG= LTG d/t ELOS  Skilled Therapeutic Interventions/Progress Updates:    Session 1 Pt received sitting in the recliner with high levels of pain, requesting pain medication- RN aware and brought in during session. He requested to take a shower. Seated BP 117/72. He ambulated to the shower with close (S) and transferred to the shower chair with min cueing for technique to maximize safety. After a minute of sitting BP at 110/67. Reviewed precautions for shower for maximizing hemodynamic stability- including keeping water luke warm and minimizing standing. Pt completed UB/LB bathing seated with (S), LB sit <> stand briefly with (S) using grab bars (which he has at home). He completed transfer back to the EOB and transferred to supine for c-collar pad change. Provided caregiver education to his wife Bradley [name] on changing pads. He came back to EOB and donned a shirt with (S). His wife assisted with donning shorts- min A. OT donned thigh high ted hose with max A. He also reported chronic, ongoing L thumb CMC pain that he was going to receive a surgical consult for. Offered kinesiotape and pt reported he normally does this at home and would love to have it back on. Provided a circumduction wrap at 10 % stretch and then distal to proximal along the tendon attachment at 80% stretch. Also provided pt some online options of pre-fabricated thumb spica splints as he reports he was dissatisfied with the thumb CMC splint. Pt was left sitting up in the w/c with all needs met. Wife present.    Session 2 Pt received  sitting in the recliner with his wife present. He was agreeable to receive a visit from [facility] the therapy dog. Visit for improved mood as pt has 8 dogs at home that he is close to. Discussed equipment needs and provided edu on transport chair vs regular manual w/c. Decided on transport chair and notified CSW. Session focused on establishing BP stability, teaching pt/wife trends to provide education on how to manage at home, as well as increasing functional activity tolerance. Static standing after 1 min: 93/62. After 10 ft of mobility it dropped to 85/66. He took a seated rest break. He then completed 120 ft of functional mobility to the therapy gym with the large base quad cane, CGA- (S) overall. Still standing and immediately following BP was 113/67. He remained standing statically for another minute and it dropped to 99/71. He took a seated rest break and then returned to his room, another 120 ft of functional mobility with the large base quad cane at (S)- CGA level. Pt was left sitting up in the recliner with all needs met and call bell within reach.     Therapy Documentation Precautions:  Precautions Precautions: Fall, Cervical, Back Required Braces or Orthoses: Cervical Brace, Spinal Brace, Sling, Splint/Cast Cervical Brace: Hard collar, At all times Spinal Brace: Thoracolumbosacral orthotic (for comfort) Splint/Cast: R long arm Restrictions Weight Bearing Restrictions: Yes RUE Weight Bearing: Non weight bearing   Therapy/Group: Individual Therapy  [name] 04/26/2022, 6:08 AM

## 2022-04-26 NOTE — Progress Notes (Signed)
PROGRESS NOTE   Subjective/Complaints:  [employer] adjusted splint again yesterday afternoon. Reports pain is under control. Muscle spasms improved.   ROS: +pain-multiple locations, mostly back/ribs-improving, +continued insomnia- improved +intermittent urinary retention-improving. Denies fevers, chills, CP, SOB, abd pain, cough, N/V/D/C, new/worsening paresthesias/weakness, or any other complaints at this time.     Objective:   No results found. Recent Labs    04/24/22 0651  WBC 5.1  HGB 8.7*  HCT 25.8*  PLT 344    Recent Labs    04/24/22 0651  NA 133*  K 3.9  CL 97*  CO2 23  GLUCOSE 125*  BUN 16  CREATININE 0.91  CALCIUM 8.7*     Intake/Output Summary (Last 24 hours) at 04/26/2022 0834 Last data filed at 04/26/2022 0336 Gross per 24 hour  Intake 780 ml  Output 1175 ml  Net -395 ml         Physical Exam: Vital Signs Blood pressure 96/73, pulse 98, temperature 98 F (36.7 C), temperature source Oral, resp. rate 16, height 5' 10"$  (1.778 m), weight 111.5 kg, SpO2 100 %.  Constitutional:  NAD, happier than yesterday. Sitting in chair. Slightly pale but similar to yesterday.  HENT: [place]/AT, MMM Eyes: conjugate gaze,  PERRL Neck: Immobilized with C- collar Cardiovascular: RRR, holosystolic murmur heard best at LUSB, no rub/gallops, distal pulses intact Pulmonary: CTAB, no w/r/r Abdominal: soft, mildly distended, nonTTP, no r/g/r, +BS throughout although slightly hypoactive-perhaps slightly more active than yesterday but still fairly quiet.  Extremities: RUE splinted and in sling.  Prior exam: Musculoskeletal: Right chest/flank pain with attempts at ROM RLE.  R arm immobilized in splint/sling. Ace wraps have been adjusted- fitting better. RUE 1+ edema, LUE 1+ edema  Skin:    General: Skin is warm.     Comments: Tattoo left arm. Scattered abrasions, bruises. RUE dressed/splinted  Neurological:     Mental  Status: He is alert and oriented to person, place, and time.     Comments: Alert and oriented x 3. Normal insight and awareness. Intact Memory. Normal language and speech. Cranial nerve exam unremarkable. RUE limited d/t ortho LUE 4/5. RLE 2/5 prox to 4+/5 distally. LLE 3+/5 prox to 5/5 distally. No focal sensory loss appreciated. No abnl resting tone.   Psychiatric:     Comments: pleasant   Assessment/Plan: 1. Functional deficits which require 3+ hours per day of interdisciplinary therapy in a comprehensive inpatient rehab setting. Physiatrist is providing close team supervision and 24 hour management of active medical problems listed below. Physiatrist and rehab team continue to assess barriers to discharge/monitor patient progress toward functional and medical goals  Care Tool:  Bathing    Body parts bathed by patient: Right arm, Left arm, Chest, Abdomen, Front perineal area, Buttocks, Right upper leg, Left upper leg, Face   Body parts bathed by helper: Right arm, Left arm, Front perineal area, Buttocks, Right upper leg, Left upper leg, Left lower leg, Right lower leg     Bathing assist Assist Level: Contact Guard/Touching assist     Upper Body Dressing/Undressing Upper body dressing   What is the patient wearing?: Pull over shirt    Upper body assist Assist Level: Minimal  Assistance - Patient > 75%    Lower Body Dressing/Undressing Lower body dressing      What is the patient wearing?: Underwear/pull up, Pants     Lower body assist Assist for lower body dressing: Minimal Assistance - Patient > 75%     Toileting Toileting    Toileting assist Assist for toileting: Total Assistance - Patient < 25%     Transfers Chair/bed transfer  Transfers assist     Chair/bed transfer assist level: Minimal Assistance - Patient > 75%     Locomotion Ambulation   Ambulation assist      Assist level: Minimal Assistance - Patient > 75% Assistive device: Hand held  assist Max distance: 95FT   Walk 10 feet activity   Assist  Walk 10 feet activity did not occur: Safety/medical concerns  Assist level: Minimal Assistance - Patient > 75% Assistive device: Hand held assist   Walk 50 feet activity   Assist Walk 50 feet with 2 turns activity did not occur: Safety/medical concerns  Assist level: Minimal Assistance - Patient > 75% Assistive device: Hand held assist    Walk 150 feet activity   Assist Walk 150 feet activity did not occur: Safety/medical concerns         Walk 10 feet on uneven surface  activity   Assist Walk 10 feet on uneven surfaces activity did not occur: Safety/medical concerns         Wheelchair     Assist Is the patient using a wheelchair?: Yes Type of Wheelchair: Manual    Wheelchair assist level: Dependent - Patient 0%      Wheelchair 50 feet with 2 turns activity    Assist        Assist Level: Dependent - Patient 0%   Wheelchair 150 feet activity     Assist      Assist Level: Dependent - Patient 0%   Blood pressure 96/73, pulse 98, temperature 98 F (36.7 C), temperature source Oral, resp. rate 16, height 5' 10"$  (1.778 m), weight 111.5 kg, SpO2 100 %.  Medical Problem List and Plan: 1. Functional deficits secondary to polytrauma after falling through a ceiling             -patient may shower if RUE is covered and pt can tolerate             -ELOS/Goals: 10-12 days, supervision to mod I with PT, OT.   -Continue CIR  -Team conference today please see physician documentation under team conference tab, met with team  to discuss problems,progress, and goals. Formulized individual treatment plan based on medical history, underlying problem and comorbidities.   -Per last ortho note due for incision check and xray around 2/23 2.  Antithrombotics: -DVT/anticoagulation:  Pharmaceutical: Lovenox 36m q12h             -antiplatelet therapy: N/A 3. Chronic pain/Pain Management: Tylenol 650  mg qid + 3278mq6h PRN, Lidoderm 2patches QD - continue Oxycontin 12hr 30 mg BID             --using oxycodone 15 mg TID (was on oxycodone 10 mg 5x/day)  -pt told me he was wanting to wean off narcotics prior to this admission -04/22/22 pt with increasing pain d/t PRN dosing; agreeable to change Oxycodone IR to 1034m4h scheduled with 5mg53mh PRN-- this will actually end up giving him slightly less on a schedule than what he's been getting with the PRNs, but still leave a PRN available if  he needs it; monitor if this works better, may want to consider alternative pain control method given his tolerance to oxycodone (?nucynta or other newer opioid med)-- defer to weekday team -04/23/22 much better control of pain with scheduled dosing-- not using much PRN; monitor  -04/24/22 Tramadol PRN was started today for mild pain  -2/20 Decreased skelaxin dose, stop robaxin and start baclofen, Kpad,  discussed plan to wean oxycodone -2/21 Seen by neuropsych and his coping issues regarding chronic pain was discussed  2/21 pain improved, continue current medications --neuropathic pain: Gabapentin up to 900 mg TID now. Cymbalta 20 mg bid --also on Skelaxin 875m QID and robaxin 10047mTID-->back spasms controlled on this.  4. Mood/Behavior/Sleep: LCSW to follow for evaluation and support.              --trazodone prn for insomnia -04/22/22 changed trazodone to scheduled 2595mHS+83m54mN and added scheduled Melatonin 5mg 7m -2/20 increase trazodone dose to 50mg 89mduled 2/21 Sleep improved             -antipsychotic agents: N/A 5. Neuropsych/cognition: This patient is capable of making decisions on his own behalf. 6. Skin/Wound Care: Routine pressure relief measures.  7. Fluids/Electrolytes/Nutrition: Monitor I/O. Check weekly BMPs plus LFTs on 04/24/22 18. C7 compression Fx: C collar at all times per Dr. Pool. Annette StableComminuted distal humerus fracture/olecranon Fx/?radial neck Fx:  S/p ORIF humerus 04/17/22-->NWB  RUE  10. HTN/ Syncope: Low dose Avapro 37.5mg QD53msumed on 02/16--will set hold parameters. --Monitor orthostatic BP. Add thigh high TEDs and abdominal binder.  -04/22/22 orthostatics+ during PT eval this morning but Avapro had been given, and TEDs not in place; advised use of TEDs and reminded nursing staff about the Avapro order parameters; monitor -2/21 stop avapro Vitals:   04/24/22 0431 04/24/22 0758 04/24/22 1310 04/24/22 2032  BP: (!) 112/54 102/72 110/70 104/66   04/25/22 0622 04/25/22 0624 04/25/22 0626 04/25/22 1308  BP: 114/69 115/67 102/65 96/61   04/25/22 2021 04/26/22 0442 04/26/22 0445 04/26/22 0448  BP: 118/73 105/65 119/76 96/73    11. T12-L5 right TP Fx:  TLSO prn 12. H/o gastric bypass: Resume Vitamin B12 IM monthly and vitamins. Intake at baseline.  13. Abdominal pain/Acute on constipation:  -KUB ordered and demonstrates moderate gas in bowels, ?ileus --Took MOM at daily at night-->"nothing else works"--will resume --pt received mag citrate at 0930 and SMOG enema at 1130-->large results at 1300 today --continue Miralax 17g TID and colace 100mg bi67mulcolax supp PRN, fleet's PRN             -SS enema in AM if no further bm's.  -04/22/22 LBM yesterday x3, abd pain improved, no n/v reported; monitor on current regimen -04/23/22 no BM since 2/16 but refused miralax a few times-- feels he might have one today, advised not missing meds today and also ordered sorbitol 30ml PRN47mcase he needs it-- discussed he will likely need enema if he doesn't have a BM by tomorrow  -2/19 order sorbitol 30ml  2/277mM yesterday 14. Urinary retention: Flomax increased to 0.8 mg today-->change to HS as continues to almost black out at EOB per pt/wife.              --monitor voiding with bladder scans/PVR checks.  -04/22/22 PVRs not being documented, required ISC last night, urinating better this morning; monitor  -2/19 appears to be continent and urinating better. 15. Soft tissue  hematoma/ABLA: H/H with downward trend. Recheck CBC in am             --  transfuse prn Hgb<7.0 or if symptomatic.  -04/22/22 Hgb 7.8, remaining stable (9.4>8.7>7.8>7.7>7.4>7.8), continue to monitor  -2/19 Hgb stable 8.7 16. Hyponatremia: 133-135 prior to admission. Monitor on weekly labs starting 04/24/22  -2/19 sodium appears to be stable at 133 17.  Orthostatic hypotension.  Patient reports he is drinking fluids.  BP appeared to be more stable with Ace wrap's- continue this.  -Consider additional medication if interferes with therapy  -stop avepro  LOS: 5 days A FACE TO FACE EVALUATION WAS PERFORMED  Jennye Boroughs 04/26/2022, 8:34 AM

## 2022-04-27 ENCOUNTER — Inpatient Hospital Stay (HOSPITAL_COMMUNITY): Payer: Medicare Other

## 2022-04-27 DIAGNOSIS — I10 Essential (primary) hypertension: Secondary | ICD-10-CM | POA: Diagnosis not present

## 2022-04-27 DIAGNOSIS — M79601 Pain in right arm: Secondary | ICD-10-CM | POA: Diagnosis not present

## 2022-04-27 DIAGNOSIS — T1490XA Injury, unspecified, initial encounter: Secondary | ICD-10-CM | POA: Diagnosis not present

## 2022-04-27 DIAGNOSIS — E871 Hypo-osmolality and hyponatremia: Secondary | ICD-10-CM

## 2022-04-27 DIAGNOSIS — I951 Orthostatic hypotension: Secondary | ICD-10-CM | POA: Diagnosis not present

## 2022-04-27 DIAGNOSIS — S42401S Unspecified fracture of lower end of right humerus, sequela: Secondary | ICD-10-CM

## 2022-04-27 MED ORDER — SODIUM CHLORIDE 1 G PO TABS
1.0000 g | ORAL_TABLET | Freq: Two times a day (BID) | ORAL | Status: DC
  Administered 2022-04-27 – 2022-05-02 (×10): 1 g via ORAL
  Filled 2022-04-27 (×10): qty 1

## 2022-04-27 NOTE — Progress Notes (Signed)
Physical Therapy Session Note  Patient Details  Name: Bradley [name] MRN: [id number] Date of Birth: 27-Jul-1970  Today's Date: 04/27/2022 PT Individual Time: 1000-1100 and 1430-1530 PT Individual Time Calculation (min): 60 min and 60 min  Short Term Goals: Week 1:  PT Short Term Goal 1 (Week 1): Pt will complete bed mobility with minA PT Short Term Goal 2 (Week 1): Pt will complete bed<>chair transfers with CGA and LRAD PT Short Term Goal 3 (Week 1): Pt will ambulate 43f with minA and LRAD PT Short Term Goal 4 (Week 1): Pt will initiate stair training  Skilled Therapeutic Interventions/Progress Updates: Pt presented in w/c coming out of bathroom after BM with wife present agreeable to therapy. [name] increased R forearm pain as pt had splint removed recently previously. Pt premedicated with rest breaks and repositioning provided as needed throughout session. Pt moved to sink to complete hand hygiene with wife assisting to clean upper arm (to remove splint fabrics). Pt noted not to have TED hose on, PTA donned hose total A. BP checked prior to mobility in sitting and standing as noted below. Pt then transported to main gym for time management and pt participated in ambulation ~2073fwith CGA fading to close supervision with use of LBQC. Pt noted to demonstrate good sequencing with LBA[facility] no increased lateral sway noted. BP checked after ambulation ([facility] noted below). Pt then participated in standing dynamic balance activities including toe taps to 4in step x 20 alternating. Pt did require use of UE support on LUE due to mild unsteadiness but no overt LOB. Pt maintained stable BP and pt agreeable to continue to perform standing activity. Pt then participated in "Lego" block activities maintaining standing for an additional 5 min without a significant drop in BP. Pt then transported back to room and pt left at sink with wife present and needs met.   Start of session Sitting: 105/73 (83) HR  84 Standing 90/71 (78) HR 93  After ambulation 120/71 (85) HR 84  During standing activity 101/76 (84) HR 95 108/75 (85) HR 96   Tx2: Pt presented in bed with wife and dgt present. Pt states some increased pain in forearm. Pt premedicated and repositioning and rest breaks provided. Pt performed bed mobility to L with mod to maxA from dgt. Pt was able to scoot to EOB but continued to slide forward with PTA and dgt blocking pt's knees to avoid risk of slipping off bed. Pt then stood and ambulated with LBNorth [facility] Surgery Centernd close supervision to toilet. Pt with continent urinary void in standing with distant supervision. Pt then ambulated to w/c with BP checked as noted below. After BP assessment pt then transported to W[facility]trance. Pt then ambulated outside ~15051f 2 incorporating turns, up/down grades and uneven surfaces with overall CGA. Pt required increased time between bouts due to fatigue and increased pain in forearm. Pt was able to demonstrate overall good safety. Pt then transported back to room and PTA performed gentle range to RUE (elbow flexion/extension, wrist supination/pronation). Pt then performed ambulatory transfer to bed with LBQMain [facility]oscopy [facility] Westd supervision. Pt left sitting EOB with wife present and needs met.   BP  Sitting at start of session 99/86/(92) HR 79 Standing 0 min: 107/77 (84) HR 87 Standing 3 min: 97/72 (80) HR 82      Therapy Documentation Precautions:  Precautions Precautions: Fall, Cervical, Back Required Braces or Orthoses: Cervical Brace, Spinal Brace, Sling, Splint/Cast Cervical Brace: Hard collar, At all times Spinal  Brace: Thoracolumbosacral orthotic (for comfort) Splint/Cast: R long arm Restrictions Weight Bearing Restrictions: Yes RUE Weight Bearing: Weight bear through elbow only General:   Vital Signs: Therapy Vitals Temp: 97.7 F (36.5 C) Temp Source: Oral Pulse Rate: 78 Resp: 18 BP: 106/74 Patient Position (if appropriate): Lying Oxygen Therapy SpO2: 100  % O2 Device: Room Air Pain: Pain Assessment Pain Scale: 0-10 Pain Score: 7  Pain Type: Acute pain Pain Location: Arm Pain Orientation: Right Pain Descriptors / Indicators: Aching Pain Frequency: Constant Pain Onset: On-going Patients Stated Pain Goal: 4 Pain Intervention(s): Medication (See eMAR) Mobility:   Locomotion :    Trunk/Postural Assessment :    Balance:   Exercises:   Other Treatments:      Therapy/Group: Individual Therapy  Tanysha Quant 04/27/2022, 4:44 PM

## 2022-04-27 NOTE — Progress Notes (Signed)
   ORTHOPAEDIC PROGRESS NOTE  s/p Procedure(s): OPEN REDUCTION INTERNAL FIXATION (ORIF) RIGHT DISTAL HUMERUS FRACTURE on 02/15/23 with Dr. Griffin Basil  SUBJECTIVE: Patient doing okay this AM. He is ready to get out of his splint. Excepted discharge date is next week. They have questions regarding post-op pain medications. Discussed we do not prescribe narcotics chronically. We have not been managing his pain while he has been in CIR either. We would recommend he continue pain management with Dr. Nelva Bush as we will not be prescribing chronic narcotics.   OBJECTIVE: PE: General: sitting up in hospital bed, NAD RUE: Splint removed. Able to tolerate gentle ROM from about 60-90 degrees. Incision CDI. + Motor in  AIN, PIN, Ulnar distributions.  Sensation intact in medial, radial, and ulnar distributions. Well perfused digits.     Vitals:   04/27/22 0510 04/27/22 0621  BP: (!) 97/53   Pulse: 73   Resp:    Temp:  98.6 F (37 C)  SpO2: 97%    Stable post-op images.   ASSESSMENT: Bradley [name] is a 52 y.o. male POD#10  PLAN: Weightbearing: NWB RUE - okay to work on ROM shoulder, elbow, wrist, hand - < 1 pound weight restriction Insicional and dressing care: Dressing removed today. Keep incisions clean and dry. Okay to leave open to air. Will recheck incision on Monday/Tuesday next week to see if sutures are ready to be removed.  Orthopedic device(s): c-collar VTE prophylaxis: per primary Pain control: Per primary team - Patient previous in pain management. We will not be prescribing narcotics chronically.  Follow - up plan: 1 week in office for incision check and xray Dispo: Currently in CIR  Contact information:  Weekdays 8-5 Dr. Ophelia Charter, Noemi Chapel PA-C, After hours and holidays please check [email or web address] for group call information for [facility]  Noemi Chapel, PA-C 04/27/2022

## 2022-04-27 NOTE — Progress Notes (Signed)
Patient ID: Bradley [name], male   DOB: February 19, 1971, 52 y.o.   MRN: [id number]  Met with pt and wife to give co-pay information regarding equipment for [employer]. Wife will call regarding this. She has ordered the [facility] on-line and getting today. Discussed OP and both prefer [facility] have faxed order to them and asked them to reach out to wife to set up follow up appointments. Pt got his cast off and can move his arm which he is glad about.

## 2022-04-27 NOTE — Progress Notes (Signed)
PROGRESS NOTE   Subjective/Complaints:  Reports continued burning pain in RUE at night. Pain is improved with ice.   ROS: +pain-multiple locations, mostly back/ribs-improving, +continued insomnia- improved +intermittent urinary retention-improving. Denies fevers, chills, CP, SOB, abd pain, cough, N/V/D/C, new/worsening paresthesias/weakness, vision changes or any other complaints at this time.     Objective:   DG Elbow 2 Views Right  Result Date: 04/27/2022 CLINICAL DATA:  Postoperative check. EXAM: RIGHT ELBOW - 2 VIEW COMPARISON:  Right elbow radiographs 04/17/2022 FINDINGS: There is diffuse decreased bone mineralization. Redemonstration of both medial and lateral plate and screw fixation of the distal humerus and screw fixation of the proximal ulna. Unchanged near anatomic alignment. Small joint effusion is similar to prior. Interval removal overlying cast material. No hardware complication is seen. IMPRESSION: Status post ORIF of the distal humerus and proximal ulna without evidence of hardware complication. Electronically Signed   By: Yvonne Kendall M.D.   On: 04/27/2022 13:28   No results for input(s): "WBC", "HGB", "HCT", "PLT" in the last 72 hours.  No results for input(s): "NA", "K", "CL", "CO2", "GLUCOSE", "BUN", "CREATININE", "CALCIUM" in the last 72 hours.   Intake/Output Summary (Last 24 hours) at 04/27/2022 1629 Last data filed at 04/27/2022 1500 Gross per 24 hour  Intake 715 ml  Output 700 ml  Net 15 ml         Physical Exam: Vital Signs Blood pressure 106/74, pulse 78, temperature 97.7 F (36.5 C), temperature source Oral, resp. rate 18, height 5' 10"$  (1.778 m), weight 111.5 kg, SpO2 100 %.  Constitutional:  NAD, Sitting in chair. Slightly pale but similar to yesterday.  HENT: [place]/AT, MMM Eyes: conjugate gaze,  PERRL Neck: Immobilized with C- collar Cardiovascular: RRR, holosystolic murmur heard best at LUSB,  no rub/gallops, distal pulses intact Pulmonary: CTAB, no w/r/r Abdominal: soft, mildly distended, nonTTP, no r/g/r, +BS throughout although slightly hypoactive-perhaps slightly more active than yesterday but still fairly quiet.  Extremities: RUE splinted and in sling. ICE packs to RUE  Prior exam: Musculoskeletal: Right chest/flank pain with attempts at ROM RLE.  R arm immobilized in splint/sling. Ace wraps have been adjusted- fitting better. RUE 1+ edema, LUE 1+ edema  Skin:    General: Skin is warm.     Comments: Tattoo left arm. Scattered abrasions, bruises. RUE dressed/splinted  Neurological:     Mental Status: He is alert and oriented to person, place, and time.     Comments: Alert and oriented x 3. Normal insight and awareness. Intact Memory. Normal language and speech. Cranial nerve exam unremarkable. RUE limited d/t ortho LUE 4/5. RLE 2/5 prox to 4+/5 distally. LLE 3+/5 prox to 5/5 distally. No focal sensory loss appreciated. No abnl resting tone.   Psychiatric:     Comments: pleasant   Assessment/Plan: 1. Functional deficits which require 3+ hours per day of interdisciplinary therapy in a comprehensive inpatient rehab setting. Physiatrist is providing close team supervision and 24 hour management of active medical problems listed below. Physiatrist and rehab team continue to assess barriers to discharge/monitor patient progress toward functional and medical goals  Care Tool:  Bathing    Body parts bathed by patient: Right arm,  Left arm, Chest, Abdomen, Front perineal area, Buttocks, Right upper leg, Left upper leg, Face, Right lower leg, Left lower leg   Body parts bathed by helper: Right arm, Left arm, Front perineal area, Buttocks, Right upper leg, Left upper leg, Left lower leg, Right lower leg     Bathing assist Assist Level: Supervision/Verbal cueing     Upper Body Dressing/Undressing Upper body dressing   What is the patient wearing?: Pull over shirt    Upper body  assist Assist Level: Supervision/Verbal cueing    Lower Body Dressing/Undressing Lower body dressing      What is the patient wearing?: Underwear/pull up, Pants     Lower body assist Assist for lower body dressing: Minimal Assistance - Patient > 75%     Toileting Toileting    Toileting assist Assist for toileting: Contact Guard/Touching assist     Transfers Chair/bed transfer  Transfers assist     Chair/bed transfer assist level: Contact Guard/Touching assist     Locomotion Ambulation   Ambulation assist      Assist level: Minimal Assistance - Patient > 75% Assistive device: Hand held assist Max distance: 95FT   Walk 10 feet activity   Assist  Walk 10 feet activity did not occur: Safety/medical concerns  Assist level: Minimal Assistance - Patient > 75% Assistive device: Hand held assist   Walk 50 feet activity   Assist Walk 50 feet with 2 turns activity did not occur: Safety/medical concerns  Assist level: Minimal Assistance - Patient > 75% Assistive device: Hand held assist    Walk 150 feet activity   Assist Walk 150 feet activity did not occur: Safety/medical concerns         Walk 10 feet on uneven surface  activity   Assist Walk 10 feet on uneven surfaces activity did not occur: Safety/medical concerns         Wheelchair     Assist Is the patient using a wheelchair?: Yes Type of Wheelchair: Manual    Wheelchair assist level: Dependent - Patient 0%      Wheelchair 50 feet with 2 turns activity    Assist        Assist Level: Dependent - Patient 0%   Wheelchair 150 feet activity     Assist      Assist Level: Dependent - Patient 0%   Blood pressure 106/74, pulse 78, temperature 97.7 F (36.5 C), temperature source Oral, resp. rate 18, height 5' 10"$  (1.778 m), weight 111.5 kg, SpO2 100 %.  Medical Problem List and Plan: 1. Functional deficits secondary to polytrauma after falling through a ceiling              -patient may shower if RUE is covered and pt can tolerate             -ELOS/Goals: 2/27, sup, min A ADLs  -Continue CIR  2.  Antithrombotics: -DVT/anticoagulation:  Pharmaceutical: Lovenox 59m q12h             -antiplatelet therapy: N/A 3. Chronic pain/Pain Management: Tylenol 650 mg qid + [id number] PRN, Lidoderm 2patches QD - continue Oxycontin 12hr 30 mg BID             --using oxycodone 15 mg TID (was on oxycodone 10 mg 5x/day)  -pt told me he was wanting to wean off narcotics prior to this admission -04/22/22 pt with increasing pain d/t PRN dosing; agreeable to change Oxycodone IR to 104m4h scheduled with 5mg36mh PRN-- this  will actually end up giving him slightly less on a schedule than what he's been getting with the PRNs, but still leave a PRN available if he needs it; monitor if this works better, may want to consider alternative pain control method given his tolerance to oxycodone (?nucynta or other newer opioid med)-- defer to weekday team -04/23/22 much better control of pain with scheduled dosing-- not using much PRN; monitor  -04/24/22 Tramadol PRN was started today for mild pain  -2/20 Decreased skelaxin dose, stop robaxin and start baclofen, Kpad,  discussed plan to wean oxycodone -2/21 Seen by neuropsych and his coping issues regarding chronic pain was discussed  2/21 pain improved, continue current medications  2/22 stop skelaxin  --neuropathic pain: Gabapentin up to 900 mg TID now. Cymbalta 20 mg bid --also on Skelaxin 849m QID and robaxin 10084mTID-->back spasms controlled on this.  4. Mood/Behavior/Sleep: LCSW to follow for evaluation and support.              --trazodone prn for insomnia -04/22/22 changed trazodone to scheduled 2538mHS+53m32mN and added scheduled Melatonin 5mg 56m -2/20 increase trazodone dose to 50mg 71mduled 2/21 Sleep improved             -antipsychotic agents: N/A 5. Neuropsych/cognition: This patient is capable of making decisions on his  own behalf. 6. Skin/Wound Care: Routine pressure relief measures.  7. Fluids/Electrolytes/Nutrition: Monitor I/O. Check weekly BMPs plus LFTs on 04/24/22 18. C7 compression Fx: C collar at all times per Dr. Pool. Annette StableComminuted distal humerus fracture/olecranon Fx/?radial neck Fx:  S/p ORIF humerus 04/17/22-->NWB RUE  2/22 Orthopedics recommending continuing nonweightbearing right upper extremity, okay for range of motion shoulder elbow wrist and hand, less than 1 pound weight restriction 10. HTN/ Syncope: Low dose Avapro 37.5mg QD22msumed on 02/16--will set hold parameters. --Monitor orthostatic BP. Add thigh high TEDs and abdominal binder.  -04/22/22 orthostatics+ during PT eval this morning but Avapro had been given, and TEDs not in place; advised use of TEDs and reminded nursing staff about the Avapro order parameters; monitor -2/21 stop avapro 2/22 well controlled Vitals:   04/25/22 0622 04/25/22 0624 04/25/22 0626 04/25/22 1308  BP: 114/69 115/67 102/65 96/61   04/25/22 2021 04/26/22 0442 04/26/22 0445 04/26/22 0448  BP: 118/73 105/65 119/76 96/73   04/26/22 1553 04/26/22 1951 04/27/22 0510 04/27/22 1349  BP: (!) 95/52 102/66 (!) 97/53 106/74    11. T12-L5 right TP Fx:  TLSO prn 12. H/o gastric bypass: Resume Vitamin B12 IM monthly and vitamins. Intake at baseline.  13. Abdominal pain/Acute on constipation:  -KUB ordered and demonstrates moderate gas in bowels, ?ileus --Took MOM at daily at night-->"nothing else works"--will resume --pt received mag citrate at 0930 and SMOG enema at 1130-->large results at 1300 today --continue Miralax 17g TID and colace 100mg bi58mulcolax supp PRN, fleet's PRN             -SS enema in AM if no further bm's.  -04/22/22 LBM yesterday x3, abd pain improved, no n/v reported; monitor on current regimen -04/23/22 no BM since 2/16 but refused miralax a few times-- feels he might have one today, advised not missing meds today and also ordered sorbitol 30ml PRN29m case he needs it-- discussed he will likely need enema if he doesn't have a BM by tomorrow  -2/19 order sorbitol 30ml  2/252mM yesterday 14. Urinary retention: Flomax increased to 0.8 mg today-->change to HS as continues to almost black out  at EOB per pt/wife.              --monitor voiding with bladder scans/PVR checks.  -04/22/22 PVRs not being documented, required ISC last night, urinating better this morning; monitor  -2/19 appears to be continent and urinating better. 15. Soft tissue hematoma/ABLA: H/H with downward trend. Recheck CBC in am             --transfuse prn Hgb<7.0 or if symptomatic.  -04/22/22 Hgb 7.8, remaining stable (9.4>8.7>7.8>7.7>7.4>7.8), continue to monitor  -2/19 Hgb stable 8.7 16. Hyponatremia: 133-135 prior to admission. Monitor on weekly labs starting 04/24/22  -2/19 sodium appears to be stable at 133  -2/22 start salt tabs 17.  Orthostatic hypotension.  Patient reports he is drinking fluids.  BP appeared to be more stable with Ace wrap's- continue this.  -Consider additional medication if interferes with therapy  -stop avepro  -2/22 start salt tabs  LOS: 6 days A FACE TO [place] 04/27/2022, 4:29 PM

## 2022-04-27 NOTE — Progress Notes (Signed)
Occupational Therapy Session Note  Patient Details  Name: Bradley [name] MRN: [id number] Date of Birth: [birth year]  Today's Date: 04/27/2022 OT Individual Time: [id number] OT Individual Time Calculation (min): 72 min    Short Term Goals: Week 1:  OT Short Term Goal 1 (Week 1): STG= LTG d/t ELOS  Skilled Therapeutic Interventions/Progress Updates:     Pt received sitting up in recliner reporting 8/10 pain in arm with Rn present in room providing pain medications. OT offering repositioning and rest breaks to accommodate for pain. Wife present in room upon OT arrival, attentive and actively participating in OT session. Pt requesting to take shower during session this AM. Pt RUE cast covered prior to shower and [place]-J collar donned prior to OT arrival. Vitals assessed in sitting BP 101/71 (80) Hr 90 PSO2 97%. Pt sit>stand CGA using LBQC with no dizziness/nausea reported. Vitals in standing BP 105/79 Hr 106 PSO2 96%. Pt ambulated to bathroom using [facility] close supervision. Doffed clothing seated on tub bench with supervision. Pt completed shower with assist to donn soap on wash cloth and position arm on grab bars to prevent WB'ing and protect from increased water exposure.Pt able to complete stand using grab bar for balance. Following shower, pt ambulated to room using [facility] CGA. Donned shirt seated EOB using hemi strategy with Pt recalling technique without cueing required. Pt donned pants with CGA for balance when standing to bring pants to waist. Pt reporting fatigue following bathing/dressing with rest break provided. Pt completed grooming hygiene tasks seated in wc for energy conservation. Pt utilizing hemi strategies taught in previous sessions to style hair, donn tooth paste, and apply deodorant. Pt left resting in wc with call bell in reach and wife present in room with all needs met. Rn notified of Pt status at end of session to provide pain patches.   Therapy Documentation Precautions:   Precautions Precautions: Fall, Cervical, Back Required Braces or Orthoses: Cervical Brace, Spinal Brace, Sling, Splint/Cast Cervical Brace: Hard collar, At all times Spinal Brace: Thoracolumbosacral orthotic (for comfort) Splint/Cast: R long arm Restrictions Weight Bearing Restrictions: Yes RUE Weight Bearing: Non weight bearing General:   Vital Signs: Therapy Vitals Temp: 98.6 F (37 C) Temp Source: Oral Pulse Rate: 73 BP: (Abnormal) 97/53 Oxygen Therapy SpO2: 97 % O2 Device: Room Air Pain: Pain Assessment Pain Scale: 0-10 Pain Score: 7  Faces Pain Scale: Hurts a little bit Pain Type: Acute pain Pain Location: Arm Pain Orientation: Right Pain Frequency: Constant Pain Intervention(s): Medication (See eMAR) PAINAD (Pain Assessment in Advanced Dementia) Breathing: normal Negative Vocalization: none Facial Expression: smiling or inexpressive Body Language: relaxed Consolability: no need to console PAINAD Score: 0 ADL: ADL Eating: Set up Grooming: Setup Upper Body Bathing: Maximal assistance Lower Body Bathing: Dependent Upper Body Dressing: Maximal assistance Lower Body Dressing: Dependent Toileting: Dependent Where Assessed-Toileting: Bedside Commode Toilet Transfer: Minimal assistance Toilet Transfer Method: Stand pivot Toilet Transfer Equipment: Drop arm bedside commode   Therapy/Group: Individual Therapy  Janey Genta 04/27/2022, 7:59 AM

## 2022-04-28 DIAGNOSIS — M79601 Pain in right arm: Secondary | ICD-10-CM | POA: Diagnosis not present

## 2022-04-28 DIAGNOSIS — I951 Orthostatic hypotension: Secondary | ICD-10-CM | POA: Diagnosis not present

## 2022-04-28 DIAGNOSIS — I10 Essential (primary) hypertension: Secondary | ICD-10-CM | POA: Diagnosis not present

## 2022-04-28 DIAGNOSIS — T1490XA Injury, unspecified, initial encounter: Secondary | ICD-10-CM | POA: Diagnosis not present

## 2022-04-28 MED ORDER — OXYCODONE HCL ER 20 MG PO T12A
20.0000 mg | EXTENDED_RELEASE_TABLET | Freq: Two times a day (BID) | ORAL | Status: DC
  Administered 2022-04-28 – 2022-05-01 (×6): 20 mg via ORAL
  Filled 2022-04-28 (×6): qty 1

## 2022-04-28 NOTE — Progress Notes (Signed)
Occupational Therapy Weekly Progress Note  Patient Details  Name: Bradley [name] MRN: [id number] Date of Birth: Jul 15, 1970  Beginning of progress report period: April 21, 2022 End of progress report period: April 28, 2022  Today's Date: 04/28/2022 OT Individual Time: [id number] OT Individual Time Calculation (min): 89 min   [name] is making steady progress toward his OT goals. He has been limited mainly by hemodynamic stability and asymptomatic orthostatic hypotension. He is at [facility]- (S) level for all ADLs and transfers.His wife [name] is very supportive and always present for education.   Patient continues to demonstrate the following deficits: muscle weakness, pain management, and decreased standing balance, decreased postural control, and decreased balance strategies and therefore will continue to benefit from skilled OT intervention to enhance overall performance with BADL and iADL.  Patient progressing toward long term goals..  Continue plan of care.  OT Short Term Goals Week 2:  OT Short Term Goal 1 (Week 2): Continue progressing toward LTGs  Skilled Therapeutic Interventions/Progress Updates:    Pt received in the recliner with 7/10 pain in his R elbow and ribs, reporting he is waiting on pain medication from RN- who entered shortly thereafter to administer. He requested to take a shower this session. R elbow splint removed and pt reporting he was cleared for it to get wet in shower ,not submerged. He completed functional mobility from the recliner to the shower with (S) using the large base quad cane. He was able to remove all clothing except socks, requiring mod A. He completed UB bathing seated with set up assist, and lB with close (S) as he stood. His BP was assessed seated- 125/77. He transferred back to supine in bed and OT changed c-collar pads while he was supine. Min cueing to maintain head firmly on the bed during rolling. Pt returned to EOB with use of HOB elevated. He  donned a shirt with (S) and shorts/underwear with (S)- increased time required to problem solve through modified figure 4 technique. He completed grooming tasks at the sink with set up assist seated. His RUE was gently ranged passively, elbow and shoulder through all planes of movement. About 25 degrees total movement at the elbow with hard end range. Pt was left sitting up in the w/c with all needs met and call bell within reach.    Therapy Documentation Precautions:  Precautions Precautions: Fall, Cervical, Back Required Braces or Orthoses: Cervical Brace, Spinal Brace, Sling, Splint/Cast Cervical Brace: Hard collar, At all times Spinal Brace: Thoracolumbosacral orthotic (for comfort) Splint/Cast: R long arm Restrictions Weight Bearing Restrictions: Yes RUE Weight Bearing: Non weight bearing   Therapy/Group: Individual Therapy  [name] 04/28/2022, 6:09 AM

## 2022-04-28 NOTE — Progress Notes (Signed)
Occupational Therapy Session Note  Patient Details  Name: Bradley [name] MRN: [id number] Date of Birth: Jan 29, 1971  Today's Date: 04/28/2022 OT Individual Time: 1415-1500 OT Individual Time Calculation (min): 45 min    Short Term Goals: Week 2:  OT Short Term Goal 1 (Week 2): Continue progressing toward LTGs  Skilled Therapeutic Interventions/Progress Updates:    Pt received sitting in the w/c agreeable to OT session. He completed 250 ft of functional mobility to the therapy gym with the large base quad cane with close (S). Kinesiotape applied to B CMC joints for support. Also applied to the supraspinatus for support and pain reduction. Sa[place]o mobile arm support used to allow pt to increase AROM of his shoulder and to provide pain reduced range. Worked on gentle scapular retraction and protraction, x10 repetitions. Shoulder flexion to 100 degrees on level 4 with <1 lb resistance extension. He worked on cross body reaching with min cueing for reducing compensatory trap movement. Gentle elbow extension/flexion provided. Pt returned to his room via 250 ft of functional mobility with the [facility] with (S). He was left sitting up with all needs met, wife present.   Therapy Documentation Precautions:  Precautions Precautions: Fall, Cervical, Back Required Braces or Orthoses: Cervical Brace, Spinal Brace, Sling, Splint/Cast Cervical Brace: Hard collar, At all times Spinal Brace: Thoracolumbosacral orthotic (for comfort) Splint/Cast: R long arm Restrictions Weight Bearing Restrictions: Yes RUE Weight Bearing: Non weight bearing   Therapy/Group: Individual Therapy  [name] 04/28/2022, 1:28 PM

## 2022-04-28 NOTE — Progress Notes (Signed)
Physical Therapy Session Note  Patient Details  Name: Bradley [name] MRN: [id number] Date of Birth: [birth year]  Today's Date: 04/28/2022 PT Individual Time: 1000-1115 PT Individual Time Calculation (min): 75 min   Short Term Goals: Week 1:  PT Short Term Goal 1 (Week 1): Pt will complete bed mobility with minA PT Short Term Goal 2 (Week 1): Pt will complete bed<>chair transfers with CGA and LRAD PT Short Term Goal 3 (Week 1): Pt will ambulate 24f with minA and LRAD PT Short Term Goal 4 (Week 1): Pt will initiate stair training  Skilled Therapeutic Interventions/Progress Updates: Pt presented in recliner with wife present agreeable to therapy. [name] pain in ribs and forearm with pt premedicated. Rest and repositiong provided during session. BP checked at start of session 121/73 (85) HR 82. BP not assessed continually this session due to higher numbers at start of session but based on pt's symptoms. [facility]. Pt transported remaining distance to day room for time management. Participated in gait activities incorporating obstacle course including weaving through cones, stepping over hockey sticks and stepping over small hurdles. Although pt continues to demonstrate narrow BOS pt did not have any episodes of instability nor LOB and was CGA for all activities. Pt also participated in corn hole incorporating BUE. At end of round pt ambulated to bags and with use of reacher was able to pick up all bags. Pt remained in standing after that activity due to sustained standing and BP checked 116/85 (95) HR 81. Pt then ambulated to NuStep and Participated x 11 min for general conditioning with pt starting at L3, increasing to L5 then 7, then cooling down back at 3. For additional x 4 min pt performed NuStep at L1 but incorporating RUE. Pt initially compensating with rotation of trunk however after 2 min pt able to decrease trunk rotation and incorporate increased range  of RUE. Pt then ambulated ~2021fack to w/c with supervision and transported back to room. Pt then performed ambulatory transfer with supervision to bed and performed sit to supine with supervision and use of bed features. Pt repositioned to comfort and left with bed alarm on, call bell within reach and wife present.      Therapy Documentation Precautions:  Precautions Precautions: Fall, Cervical, Back Required Braces or Orthoses: Cervical Brace, Spinal Brace, Sling, Splint/Cast Cervical Brace: Hard collar, At all times Spinal Brace: Thoracolumbosacral orthotic (for comfort) Splint/Cast: R long arm Restrictions Weight Bearing Restrictions: Yes RUE Weight Bearing: Non weight bearing General:   Vital Signs: Therapy Vitals Temp: 98.1 F (36.7 C) Pulse Rate: 82 Resp: 18 BP: 114/72 Patient Position (if appropriate): Sitting Oxygen Therapy SpO2: 98 % O2 Device: Room Air Pain: Pain Assessment Pain Score: 4   Therapy/Group: Individual Therapy  Adelia Baptista 04/28/2022, 4:20 PM

## 2022-04-28 NOTE — Progress Notes (Signed)
PROGRESS NOTE   Subjective/Complaints:  Pt is concerned about continuation of post-op pain medications after discharged. Refills will likely be from his pain management doctor after initial Rx. He will call office today to discuss.   ROS: +pain-multiple locations, mostly back/ribs-improving, +continued insomnia- improved +intermittent urinary retention-improving. Denies fevers, chills, CP, SOB, abd pain, HA, cough, N/V/D/C, new/worsening paresthesias/weakness, vision changes or any other complaints at this time.     Objective:   DG Elbow 2 Views Right  Result Date: 04/27/2022 CLINICAL DATA:  Postoperative check. EXAM: RIGHT ELBOW - 2 VIEW COMPARISON:  Right elbow radiographs 04/17/2022 FINDINGS: There is diffuse decreased bone mineralization. Redemonstration of both medial and lateral plate and screw fixation of the distal humerus and screw fixation of the proximal ulna. Unchanged near anatomic alignment. Small joint effusion is similar to prior. Interval removal overlying cast material. No hardware complication is seen. IMPRESSION: Status post ORIF of the distal humerus and proximal ulna without evidence of hardware complication. Electronically Signed   By: Yvonne Kendall M.D.   On: 04/27/2022 13:28   No results for input(s): "WBC", "HGB", "HCT", "PLT" in the last 72 hours.  No results for input(s): "NA", "K", "CL", "CO2", "GLUCOSE", "BUN", "CREATININE", "CALCIUM" in the last 72 hours.   Intake/Output Summary (Last 24 hours) at 04/28/2022 0821 Last data filed at 04/28/2022 0600 Gross per 24 hour  Intake 715 ml  Output 1300 ml  Net -585 ml         Physical Exam: Vital Signs Blood pressure 100/68, pulse 85, temperature 98 F (36.7 C), resp. rate 17, height '5\' 10"'$  (1.778 m), weight 111.5 kg, SpO2 98 %.  Constitutional:  NAD, Sitting in chair.  HENT: [place]/AT, MMM Eyes: EOMI,   PERRL Neck: Immobilized with C-  collar Cardiovascular: RRR, holosystolic murmur heard best at LUSB, no rub/gallops, distal pulses intact Pulmonary: CTAB, no w/r/r, good air movement Abdominal: soft, mildly distended, nonTTP, no r/g/r, +BS throughout although slightly hypoactive  Extremities: RUE splint has been removed and in sling. Incisions CDI  Prior exam: Musculoskeletal: Right chest/flank pain with attempts at ROM RLE.  R arm immobilized in splint/sling. Ace wraps have been adjusted- fitting better. RUE 1+ edema, LUE 1+ edema  Skin:    General: Skin is warm.     Comments: Tattoo left arm. Scattered abrasions, bruises. RUE dressed/splinted  Neurological:     Mental Status: He is alert and oriented to person, place, and time.     Comments: Alert and oriented x 3. Normal insight and awareness. Intact Memory. Normal language and speech. Cranial nerve exam unremarkable. RUE limited d/t ortho LUE 4/5. RLE 2/5 prox to 4+/5 distally. LLE 3+/5 prox to 5/5 distally. No focal sensory loss appreciated. No abnl resting tone.   Psychiatric:     Comments: pleasant   Assessment/Plan: 1. Functional deficits which require 3+ hours per day of interdisciplinary therapy in a comprehensive inpatient rehab setting. Physiatrist is providing close team supervision and 24 hour management of active medical problems listed below. Physiatrist and rehab team continue to assess barriers to discharge/monitor patient progress toward functional and medical goals  Care Tool:  Bathing    Body parts bathed  by patient: Right arm, Left arm, Chest, Abdomen, Front perineal area, Buttocks, Right upper leg, Left upper leg, Face, Right lower leg, Left lower leg   Body parts bathed by helper: Right arm, Left arm, Front perineal area, Buttocks, Right upper leg, Left upper leg, Left lower leg, Right lower leg     Bathing assist Assist Level: Supervision/Verbal cueing     Upper Body Dressing/Undressing Upper body dressing   What is the patient wearing?:  Pull over shirt    Upper body assist Assist Level: Supervision/Verbal cueing    Lower Body Dressing/Undressing Lower body dressing      What is the patient wearing?: Underwear/pull up, Pants     Lower body assist Assist for lower body dressing: Minimal Assistance - Patient > 75%     Toileting Toileting    Toileting assist Assist for toileting: Contact Guard/Touching assist     Transfers Chair/bed transfer  Transfers assist     Chair/bed transfer assist level: Contact Guard/Touching assist     Locomotion Ambulation   Ambulation assist      Assist level: Minimal Assistance - Patient > 75% Assistive device: Hand held assist Max distance: 95FT   Walk 10 feet activity   Assist  Walk 10 feet activity did not occur: Safety/medical concerns  Assist level: Minimal Assistance - Patient > 75% Assistive device: Hand held assist   Walk 50 feet activity   Assist Walk 50 feet with 2 turns activity did not occur: Safety/medical concerns  Assist level: Minimal Assistance - Patient > 75% Assistive device: Hand held assist    Walk 150 feet activity   Assist Walk 150 feet activity did not occur: Safety/medical concerns         Walk 10 feet on uneven surface  activity   Assist Walk 10 feet on uneven surfaces activity did not occur: Safety/medical concerns         Wheelchair     Assist Is the patient using a wheelchair?: Yes Type of Wheelchair: Manual    Wheelchair assist level: Dependent - Patient 0%      Wheelchair 50 feet with 2 turns activity    Assist        Assist Level: Dependent - Patient 0%   Wheelchair 150 feet activity     Assist      Assist Level: Dependent - Patient 0%   Blood pressure 100/68, pulse 85, temperature 98 F (36.7 C), resp. rate 17, height '5\' 10"'$  (1.778 m), weight 111.5 kg, SpO2 98 %.  Medical Problem List and Plan: 1. Functional deficits secondary to polytrauma after falling through a ceiling              -patient may shower if RUE is covered and pt can tolerate             -ELOS/Goals: 2/27, sup, min A ADLs  -Continue CIR  2.  Antithrombotics: -DVT/anticoagulation:  Pharmaceutical: Lovenox '30mg'$  q12h             -antiplatelet therapy: N/A 3. Chronic pain/Pain Management: Tylenol 650 mg qid + '325mg'$  q6h PRN, Lidoderm 2patches QD - continue Oxycontin 12hr 30 mg BID             --using oxycodone 15 mg TID (was on oxycodone 10 mg 5x/day)  -pt told me he was wanting to wean off narcotics prior to this admission -04/22/22 pt with increasing pain d/t PRN dosing; agreeable to change Oxycodone IR to '10mg'$  q4h scheduled with '5mg'$  q4h PRN--  this will actually end up giving him slightly less on a schedule than what he's been getting with the PRNs, but still leave a PRN available if he needs it; monitor if this works better, may want to consider alternative pain control method given his tolerance to oxycodone (?nucynta or other newer opioid med)-- defer to weekday team -04/23/22 much better control of pain with scheduled dosing-- not using much PRN; monitor  -04/24/22 Tramadol PRN was started today for mild pain  -2/20 Decreased skelaxin dose, stop robaxin and start baclofen, Kpad,  discussed plan to wean oxycodone -2/21 Seen by neuropsych and his coping issues regarding chronic pain was discussed  2/21 pain improved, continue current medications  2/22 stop skelaxin   -2/23 decrease oxycontin to '20mg'$  q12h --neuropathic pain: Gabapentin up to 900 mg TID now. Cymbalta 20 mg bid --also on Skelaxin '800mg'$  QID and robaxin '1000mg'$  TID-->back spasms controlled on this.  4. Mood/Behavior/Sleep: LCSW to follow for evaluation and support.              --trazodone prn for insomnia -04/22/22 changed trazodone to scheduled '25mg'$  QHS+'25mg'$  PRN and added scheduled Melatonin '5mg'$  QHS -2/20 increase trazodone dose to '50mg'$  scheduled 2/21 Sleep improved  Continue trazodone current dose             -antipsychotic agents:  N/A 5. Neuropsych/cognition: This patient is capable of making decisions on his own behalf. 6. Skin/Wound Care: Routine pressure relief measures.  7. Fluids/Electrolytes/Nutrition: Monitor I/O. Check weekly BMPs plus LFTs on 04/24/22 18. C7 compression Fx: C collar at all times per Dr. Annette Stable.  9. Comminuted distal humerus fracture/olecranon Fx/?radial neck Fx:  S/p ORIF humerus 04/17/22-->NWB RUE  2/22 Orthopedics recommending continuing nonweightbearing right upper extremity, okay for range of motion shoulder elbow wrist and hand, less than 1 pound weight restriction Splint removed yesterday 10. HTN/ Syncope: Low dose Avapro 37.'5mg'$  QD resumed on 02/16--will set hold parameters. --Monitor orthostatic BP. Add thigh high TEDs and abdominal binder.  -04/22/22 orthostatics+ during PT eval this morning but Avapro had been given, and TEDs not in place; advised use of TEDs and reminded nursing staff about the Avapro order parameters; monitor -2/21 stop avapro 2/23 controlled overall Vitals:   04/26/22 0442 04/26/22 0445 04/26/22 0448 04/26/22 1553  BP: 105/65 119/76 96/73 (!) 95/52   04/26/22 1951 04/27/22 0510 04/27/22 1349 04/27/22 1946  BP: 102/66 (!) 97/53 106/74 113/68   04/28/22 0454 04/28/22 0600 04/28/22 0601 04/28/22 0602  BP: 97/60 103/65 101/72 100/68    11. T12-L5 right TP Fx:  TLSO prn 12. H/o gastric bypass: Resume Vitamin B12 IM monthly and vitamins. Intake at baseline.  13. Abdominal pain/Acute on constipation:  -KUB ordered and demonstrates moderate gas in bowels, ?ileus --Took MOM at daily at night-->"nothing else works"--will resume --pt received mag citrate at 0930 and SMOG enema at 1130-->large results at 1300 today --continue Miralax 17g TID and colace '100mg'$  bid, dulcolax supp PRN, fleet's PRN             -SS enema in AM if no further bm's.  -04/22/22 LBM yesterday x3, abd pain improved, no n/v reported; monitor on current regimen -04/23/22 no BM since 2/16 but refused miralax  a few times-- feels he might have one today, advised not missing meds today and also ordered sorbitol 67m PRN in case he needs it-- discussed he will likely need enema if he doesn't have a BM by tomorrow  -2/19 order sorbitol 330m 2/20 LBM yesterday 14.  Urinary retention: Flomax increased to 0.8 mg today-->change to HS as continues to almost black out at EOB per pt/wife.              --monitor voiding with bladder scans/PVR checks.  -04/22/22 PVRs not being documented, required ISC last night, urinating better this morning; monitor  -2/19 appears to be continent and urinating better. 15. Soft tissue hematoma/ABLA: H/H with downward trend. Recheck CBC in am             --transfuse prn Hgb<7.0 or if symptomatic.  -04/22/22 Hgb 7.8, remaining stable (9.4>8.7>7.8>7.7>7.4>7.8), continue to monitor  -2/19 Hgb stable 8.7 16. Hyponatremia: 133-135 prior to admission. Monitor on weekly labs starting 04/24/22  -2/19 sodium appears to be stable at 133  -2/22 start salt tabs 17.  Orthostatic hypotension.  Patient reports he is drinking fluids.  BP appeared to be more stable with Ace wrap's- continue this.  -Consider additional medication if interferes with therapy  -stop avepro  -2/22 start salt tabs  -2/23 denies orthostatic symptoms, monitor response to medication change  LOS: 7 days A FACE TO FACE EVALUATION WAS PERFORMED  Jennye Boroughs 04/28/2022, 8:21 AM

## 2022-04-29 DIAGNOSIS — T1490XA Injury, unspecified, initial encounter: Secondary | ICD-10-CM | POA: Diagnosis not present

## 2022-04-29 DIAGNOSIS — G8911 Acute pain due to trauma: Secondary | ICD-10-CM | POA: Diagnosis not present

## 2022-04-29 MED ORDER — TRAMADOL HCL 50 MG PO TABS
50.0000 mg | ORAL_TABLET | Freq: Four times a day (QID) | ORAL | Status: DC | PRN
  Administered 2022-04-29 – 2022-05-02 (×5): 50 mg via ORAL
  Filled 2022-04-29 (×5): qty 1

## 2022-04-29 MED ORDER — OXYCODONE HCL 5 MG PO TABS
5.0000 mg | ORAL_TABLET | ORAL | Status: DC | PRN
  Administered 2022-04-29 – 2022-05-01 (×10): 5 mg via ORAL
  Filled 2022-04-29 (×10): qty 1

## 2022-04-29 MED ORDER — GLUCAGON HCL RDNA (DIAGNOSTIC) 1 MG IJ SOLR
INTRAMUSCULAR | Status: AC
  Filled 2022-04-29: qty 1

## 2022-04-29 NOTE — Progress Notes (Signed)
PROGRESS NOTE   Subjective/Complaints:  He and his wife are concerned with who is going to handle his pain meds once he leaves the hospital. Tried getting in touch with pain med doc and haven't been able to-- unclear if Dr. Griffin Basil is taking over or if PM&R will be-- just for the temporary increase in meds compared to his chronic.  Having some more pain since decreasing med dosing, but it's manageable. Is upset that sometimes dosing times are late/delayed, and that sometimes he's given tramadol instead of oxycodone-- will adjust PRN comments to see if this helps, but encouraged pt to ask what he's being given before he takes it.  LBM this morning. Urinating well, better than before.  Slept ok.  No other complaints or concerns today.   ROS: +pain-multiple locations, mostly back/ribs-improving, +continued insomnia- improved +intermittent urinary retention-improved. Denies fevers, chills, CP, SOB, abd pain, N/V/D/C, new/worsening paresthesias/weakness, or any other complaints at this time.     Objective:   DG Elbow 2 Views Right  Result Date: 04/27/2022 CLINICAL DATA:  Postoperative check. EXAM: RIGHT ELBOW - 2 VIEW COMPARISON:  Right elbow radiographs 04/17/2022 FINDINGS: There is diffuse decreased bone mineralization. Redemonstration of both medial and lateral plate and screw fixation of the distal humerus and screw fixation of the proximal ulna. Unchanged near anatomic alignment. Small joint effusion is similar to prior. Interval removal overlying cast material. No hardware complication is seen. IMPRESSION: Status post ORIF of the distal humerus and proximal ulna without evidence of hardware complication. Electronically Signed   By: Yvonne Kendall M.D.   On: 04/27/2022 13:28   No results for input(s): "WBC", "HGB", "HCT", "PLT" in the last 72 hours.  No results for input(s): "NA", "K", "CL", "CO2", "GLUCOSE", "BUN", "CREATININE", "CALCIUM"  in the last 72 hours.   Intake/Output Summary (Last 24 hours) at 04/29/2022 0656 Last data filed at 04/29/2022 0045 Gross per 24 hour  Intake 720 ml  Output 0 ml  Net 720 ml         Physical Exam: Vital Signs Blood pressure 114/65, pulse 74, temperature 98.3 F (36.8 C), temperature source Oral, resp. rate 18, height '5\' 10"'$  (1.778 m), weight 111.5 kg, SpO2 99 %.  Constitutional:  NAD, Sitting in bed HENT: [place]/AT, MMM Eyes: EOMI,   PERRL Neck: Immobilized with C- collar Cardiovascular: RRR, holosystolic murmur heard best at LUSB, no rub/gallops, distal pulses intact Pulmonary: CTAB, no w/r/r, good air movement Abdominal: soft, nondistended, nonTTP, no r/g/r, +BS throughout  Extremities: RUE splint has been removed, resting on pillow, slight bruising noted to antecubital fossa. Incisions CDI  Prior exam: Musculoskeletal: Right chest/flank pain with attempts at ROM RLE.  R arm immobilized in splint/sling. Ace wraps have been adjusted- fitting better. RUE 1+ edema, LUE 1+ edema  Skin:    General: Skin is warm.     Comments: Tattoo left arm. Scattered abrasions, bruises. RUE dressed/splinted  Neurological:     Mental Status: He is alert and oriented to person, place, and time.     Comments: Alert and oriented x 3. Normal insight and awareness. Intact Memory. Normal language and speech. Cranial nerve exam unremarkable. RUE limited d/t ortho LUE  4/5. RLE 2/5 prox to 4+/5 distally. LLE 3+/5 prox to 5/5 distally. No focal sensory loss appreciated. No abnl resting tone.   Psychiatric:     Comments: pleasant   Assessment/Plan: 1. Functional deficits which require 3+ hours per day of interdisciplinary therapy in a comprehensive inpatient rehab setting. Physiatrist is providing close team supervision and 24 hour management of active medical problems listed below. Physiatrist and rehab team continue to assess barriers to discharge/monitor patient progress toward functional and medical  goals  Care Tool:  Bathing    Body parts bathed by patient: Right arm, Left arm, Chest, Abdomen, Front perineal area, Buttocks, Right upper leg, Left upper leg, Face, Right lower leg, Left lower leg   Body parts bathed by helper: Right arm, Left arm, Front perineal area, Buttocks, Right upper leg, Left upper leg, Left lower leg, Right lower leg     Bathing assist Assist Level: Set up assist     Upper Body Dressing/Undressing Upper body dressing   What is the patient wearing?: Pull over shirt    Upper body assist Assist Level: Supervision/Verbal cueing    Lower Body Dressing/Undressing Lower body dressing      What is the patient wearing?: Underwear/pull up, Pants     Lower body assist Assist for lower body dressing: Supervision/Verbal cueing     Toileting Toileting    Toileting assist Assist for toileting: Supervision/Verbal cueing     Transfers Chair/bed transfer  Transfers assist     Chair/bed transfer assist level: Supervision/Verbal cueing     Locomotion Ambulation   Ambulation assist      Assist level: Minimal Assistance - Patient > 75% Assistive device: Hand held assist Max distance: 95FT   Walk 10 feet activity   Assist  Walk 10 feet activity did not occur: Safety/medical concerns  Assist level: Minimal Assistance - Patient > 75% Assistive device: Hand held assist   Walk 50 feet activity   Assist Walk 50 feet with 2 turns activity did not occur: Safety/medical concerns  Assist level: Minimal Assistance - Patient > 75% Assistive device: Hand held assist    Walk 150 feet activity   Assist Walk 150 feet activity did not occur: Safety/medical concerns         Walk 10 feet on uneven surface  activity   Assist Walk 10 feet on uneven surfaces activity did not occur: Safety/medical concerns         Wheelchair     Assist Is the patient using a wheelchair?: Yes Type of Wheelchair: Manual    Wheelchair assist level:  Dependent - Patient 0%      Wheelchair 50 feet with 2 turns activity    Assist        Assist Level: Dependent - Patient 0%   Wheelchair 150 feet activity     Assist      Assist Level: Dependent - Patient 0%   Blood pressure 114/65, pulse 74, temperature 98.3 F (36.8 C), temperature source Oral, resp. rate 18, height '5\' 10"'$  (1.778 m), weight 111.5 kg, SpO2 99 %.  Medical Problem List and Plan: 1. Functional deficits secondary to polytrauma after falling through a ceiling             -patient may shower if RUE is covered and pt can tolerate             -ELOS/Goals: 2/27, sup, min A ADLs  -Continue CIR  2.  Antithrombotics: -DVT/anticoagulation:  Pharmaceutical: Lovenox '30mg'$  q12h             -  antiplatelet therapy: N/A 3. Chronic pain/Pain Management: Tylenol 650 mg qid + '325mg'$  q6h PRN, Lidoderm 2patches QD - continue Oxycontin 12hr 30 mg BID             --using oxycodone 15 mg TID (was on oxycodone 10 mg 5x/day)  -pt told me he was wanting to wean off narcotics prior to this admission -04/22/22 pt with increasing pain d/t PRN dosing; agreeable to change Oxycodone IR to '10mg'$  q4h scheduled with '5mg'$  q4h PRN-- this will actually end up giving him slightly less on a schedule than what he's been getting with the PRNs, but still leave a PRN available if he needs it; monitor if this works better, may want to consider alternative pain control method given his tolerance to oxycodone (?nucynta or other newer opioid med)-- defer to weekday team -04/23/22 much better control of pain with scheduled dosing-- not using much PRN; monitor -04/24/22 Tramadol '50mg'$  PRN was started today for mild pain -2/20 Decreased skelaxin dose, stop robaxin and start baclofen '5mg'$  TID, Kpad, discussed plan to wean oxycodone -2/21 Seen by neuropsych and his coping issues regarding chronic pain was discussed -2/21 pain improved, continue current medications -2/22 stop skelaxin  -2/23 decrease oxycontin to '20mg'$   q12h -04/29/22 changed PRN comments to say oxy first, tramadol second, advised speaking with nursing staff as well regarding which he's being given. Defer d/c planning with regards to pain meds to weekday team --neuropathic pain: Gabapentin up to 900 mg TID now. Cymbalta 20 mg bid --also on Skelaxin '800mg'$  QID and robaxin '1000mg'$  TID-->back spasms controlled on this--d/c'd as above  4. Mood/Behavior/Sleep: LCSW to follow for evaluation and support.              --trazodone prn for insomnia -04/22/22 changed trazodone to scheduled '25mg'$  QHS+'25mg'$  PRN and added scheduled Melatonin '5mg'$  QHS -2/20 increase trazodone dose to '50mg'$  scheduled -04/29/22 Sleep improved; Continue trazodone current dose             -antipsychotic agents: N/A 5. Neuropsych/cognition: This patient is capable of making decisions on his own behalf. 6. Skin/Wound Care: Routine pressure relief measures.  7. Fluids/Electrolytes/Nutrition: Monitor I/O. Check weekly labs next 05/01/22 8. C7 compression Fx: C collar at all times per Dr. Annette Stable.  9. Comminuted distal humerus fracture/olecranon Fx/?radial neck Fx:  S/p ORIF humerus 04/17/22-->NWB RUE  -2/22 Orthopedics recommending continuing nonweightbearing right upper extremity, okay for range of motion shoulder elbow wrist and hand, less than 1 pound weight restriction -Splint removed 04/27/22 10. HTN/ Syncope: Low dose Avapro 37.'5mg'$  QD resumed on 02/16--will set hold parameters. --Monitor orthostatic BP. Add thigh high TEDs and abdominal binder.  -04/22/22 orthostatics+ during PT eval this morning but Avapro had been given, and TEDs not in place; advised use of TEDs and reminded nursing staff about the Avapro order parameters; monitor -2/21 stop avapro -04/29/22 well controlled overall, remain off meds for now.  Vitals:   04/26/22 1553 04/26/22 1951 04/27/22 0510 04/27/22 1349  BP: (!) 95/52 102/66 (!) 97/53 106/74   04/27/22 1946 04/28/22 0454 04/28/22 0600 04/28/22 0601  BP: 113/68 97/60  103/65 101/72   04/28/22 0602 04/28/22 1347 04/28/22 1938 04/29/22 0322  BP: 100/68 114/72 123/77 114/65    11. T12-L5 right TP Fx:  TLSO prn 12. H/o gastric bypass: Resume Vitamin B12 IM monthly and vitamins. Intake at baseline.  13. Abdominal pain/Acute on constipation:  -KUB ordered and demonstrates moderate gas in bowels, ?ileus --Took MOM at daily at night-->"nothing else works"--will resume --  pt received mag citrate at 0930 and SMOG enema at 1130-->large results at 1300 today --continue Miralax 17g TID and colace '100mg'$  bid, dulcolax supp PRN, fleet's PRN             -SS enema in AM if no further bm's.  -04/22/22 LBM yesterday x3, abd pain improved, no n/v reported; monitor on current regimen -04/23/22 no BM since 2/16 but refused miralax a few times-- feels he might have one today, advised not missing meds today and also ordered sorbitol 27m PRN in case he needs it-- discussed he will likely need enema if he doesn't have a BM by tomorrow -2/19 order sorbitol 338m-04/29/22 LBM this morning, cont regimen 14. Urinary retention: Flomax increased to 0.8 mg today-->change to HS as continues to almost black out at EOB per pt/wife.              --monitor voiding with bladder scans/PVR checks.  -04/22/22 PVRs not being documented, required ISC last night, urinating better this morning; monitor -2/19 appears to be continent and urinating better. 15. Soft tissue hematoma/ABLA: H/H with downward trend. Recheck CBC in am             --transfuse prn Hgb<7.0 or if symptomatic.  -04/22/22 Hgb 7.8, remaining stable (9.4>8.7>7.8>7.7>7.4>7.8), continue to monitor -2/19 Hgb stable 8.7, monitor with weekly labs next 05/01/22 16. Hyponatremia: 133-135 prior to admission. Monitor on weekly labs  -2/19 sodium appears to be stable at 133  -2/22 start salt tabs, monitor with labs next 05/01/22 17.  Orthostatic hypotension.  Patient reports he is drinking fluids.  BP appeared to be more stable with Ace wrap's-  continue this.  -Consider additional medication if interferes with therapy  -stop avepro  -2/22 start salt tabs -2/23 denies orthostatic symptoms, monitor response to medication change -04/29/22 seems to be improving, monitor  LOS: 8 days A FACE TO FAHorace/24/2024, 6:56 AM

## 2022-04-30 DIAGNOSIS — G8911 Acute pain due to trauma: Secondary | ICD-10-CM | POA: Diagnosis not present

## 2022-04-30 DIAGNOSIS — T1490XA Injury, unspecified, initial encounter: Secondary | ICD-10-CM | POA: Diagnosis not present

## 2022-04-30 NOTE — Progress Notes (Signed)
Physical Therapy Session Note  Patient Details  Name: Bradley [name] MRN: [id number] Date of Birth: [birth year]  Today's Date: 04/30/2022 PT Individual Time: [id number] PT Individual Time Calculation (min): 46 min   Short Term Goals: Week 1:  PT Short Term Goal 1 (Week 1): Pt will complete bed mobility with minA PT Short Term Goal 2 (Week 1): Pt will complete bed<>chair transfers with CGA and LRAD PT Short Term Goal 3 (Week 1): Pt will ambulate 72f with minA and LRAD PT Short Term Goal 4 (Week 1): Pt will initiate stair training  Skilled Therapeutic Interventions/Progress Updates:  Pt was seen bedside in the pm with wife present. Pt performed bed mobility with S and head of bed elevated. Pt performed multiple sit to stand and stand pivot transfers with S and verbal cues. Pt ambulated 125 feet x 2 with LBQC and S and occasional verbal cues. Pt performed multiple car transfers with c/g and verbal cues. Pt educated on back precautions and to allow wife to assist as a balance point rather than twisting when getting in and out of car. Pt tolerated standing x 5 minutes at a time with S. After treatment pt returned to room and transferred back to edge of bed with S. Pt returned to supine with head of bed elevated and S. Pt left sitting up in bed with wife at bedside and all needs within reach.   Therapy Documentation Precautions:  Precautions Precautions: Fall, Cervical, Back Required Braces or Orthoses: Cervical Brace, Spinal Brace, Sling, Splint/Cast Cervical Brace: Hard collar, At all times Spinal Brace: Thoracolumbosacral orthotic (for comfort) Splint/Cast: R long arm Restrictions Weight Bearing Restrictions: Yes RUE Weight Bearing: Non weight bearing General:   Pain: [name] 7/10 back pain, medicated prior to treatment.     Therapy/Group: Individual Therapy  MDub Amis2/25/2024, 3:50 PM

## 2022-04-30 NOTE — Progress Notes (Signed)
PROGRESS NOTE   Subjective/Complaints:  Still concerned regarding pain regimen-- thinks he maybe went down in dosing too soon-- advised discussing this with weekday team. Says pain is "tolerable but not fully relieved".  Slept about the same as he does at home, not great but similar to chronic state.  LBM this morning, has been having daily BMs, and is declining the TID Miralax for several days at this point. Will remove this order since he's not needing it. He's happy with that idea.  Urinating well still.    ROS: +pain-multiple locations, mostly back/ribs-gradualy improving, +continued insomnia- improved +intermittent urinary retention-resolved. Denies fevers, chills, CP, SOB, abd pain, N/V/D/C, new/worsening paresthesias/weakness, or any other complaints at this time.     Objective:   No results found. No results for input(s): "WBC", "HGB", "HCT", "PLT" in the last 72 hours.  No results for input(s): "NA", "K", "CL", "CO2", "GLUCOSE", "BUN", "CREATININE", "CALCIUM" in the last 72 hours.   Intake/Output Summary (Last 24 hours) at 04/30/2022 0705 Last data filed at 04/29/2022 2000 Gross per 24 hour  Intake 1303 ml  Output --  Net 1303 ml         Physical Exam: Vital Signs Blood pressure 101/62, pulse 80, temperature 98.1 F (36.7 C), temperature source Oral, resp. rate 18, height '5\' 10"'$  (1.778 m), weight 111.5 kg, SpO2 98 %.  Constitutional:  NAD, Sitting in w/c at sink HENT: [place]/AT, MMM Eyes: EOMI,   PERRL Neck: Immobilized with C- collar Cardiovascular: RRR, holosystolic murmur heard best at LUSB, no rub/gallops, distal pulses intact Pulmonary: CTAB, no w/r/r, good air movement Abdominal: soft, nondistended, nonTTP, no r/g/r, +BS throughout  Extremities: RUE splint has been removed, resting on armchair, slight bruising noted to antecubital fossa, moderate bruising noted to axillary area. Incisions CDI  Prior  exam: Musculoskeletal: Right chest/flank pain with attempts at ROM RLE.  R arm immobilized in splint/sling. Ace wraps have been adjusted- fitting better. RUE 1+ edema, LUE 1+ edema  Skin:    General: Skin is warm.     Comments: Tattoo left arm. Scattered abrasions, bruises. RUE dressed/splinted  Neurological:     Mental Status: He is alert and oriented to person, place, and time.     Comments: Alert and oriented x 3. Normal insight and awareness. Intact Memory. Normal language and speech. Cranial nerve exam unremarkable. RUE limited d/t ortho LUE 4/5. RLE 2/5 prox to 4+/5 distally. LLE 3+/5 prox to 5/5 distally. No focal sensory loss appreciated. No abnl resting tone.   Psychiatric:     Comments: pleasant   Assessment/Plan: 1. Functional deficits which require 3+ hours per day of interdisciplinary therapy in a comprehensive inpatient rehab setting. Physiatrist is providing close team supervision and 24 hour management of active medical problems listed below. Physiatrist and rehab team continue to assess barriers to discharge/monitor patient progress toward functional and medical goals  Care Tool:  Bathing    Body parts bathed by patient: Right arm, Left arm, Chest, Abdomen, Front perineal area, Buttocks, Right upper leg, Left upper leg, Face, Right lower leg, Left lower leg   Body parts bathed by helper: Right arm, Left arm, Front perineal area, Buttocks, Right  upper leg, Left upper leg, Left lower leg, Right lower leg     Bathing assist Assist Level: Set up assist     Upper Body Dressing/Undressing Upper body dressing   What is the patient wearing?: Pull over shirt    Upper body assist Assist Level: Supervision/Verbal cueing    Lower Body Dressing/Undressing Lower body dressing      What is the patient wearing?: Underwear/pull up, Pants     Lower body assist Assist for lower body dressing: Supervision/Verbal cueing     Toileting Toileting    Toileting assist Assist  for toileting: Supervision/Verbal cueing     Transfers Chair/bed transfer  Transfers assist     Chair/bed transfer assist level: Supervision/Verbal cueing     Locomotion Ambulation   Ambulation assist      Assist level: Minimal Assistance - Patient > 75% Assistive device: Hand held assist Max distance: 95FT   Walk 10 feet activity   Assist  Walk 10 feet activity did not occur: Safety/medical concerns  Assist level: Minimal Assistance - Patient > 75% Assistive device: Hand held assist   Walk 50 feet activity   Assist Walk 50 feet with 2 turns activity did not occur: Safety/medical concerns  Assist level: Minimal Assistance - Patient > 75% Assistive device: Hand held assist    Walk 150 feet activity   Assist Walk 150 feet activity did not occur: Safety/medical concerns         Walk 10 feet on uneven surface  activity   Assist Walk 10 feet on uneven surfaces activity did not occur: Safety/medical concerns         Wheelchair     Assist Is the patient using a wheelchair?: Yes Type of Wheelchair: Manual    Wheelchair assist level: Dependent - Patient 0%      Wheelchair 50 feet with 2 turns activity    Assist        Assist Level: Dependent - Patient 0%   Wheelchair 150 feet activity     Assist      Assist Level: Dependent - Patient 0%   Blood pressure 101/62, pulse 80, temperature 98.1 F (36.7 C), temperature source Oral, resp. rate 18, height '5\' 10"'$  (1.778 m), weight 111.5 kg, SpO2 98 %.  Medical Problem List and Plan: 1. Functional deficits secondary to polytrauma after falling through a ceiling             -patient may shower if RUE is covered and pt can tolerate             -ELOS/Goals: 2/27, sup, min A ADLs  -Continue CIR  2.  Antithrombotics: -DVT/anticoagulation:  Pharmaceutical: Lovenox '30mg'$  q12h             -antiplatelet therapy: N/A 3. Chronic pain/Pain Management: Tylenol 650 mg qid + '325mg'$  q6h PRN,  Lidoderm 2patches QD - continue Oxycontin 12hr 30 mg BID             --using oxycodone 15 mg TID (was on oxycodone 10 mg 5x/day)  -pt told me he was wanting to wean off narcotics prior to this admission -04/22/22 pt with increasing pain d/t PRN dosing; agreeable to change Oxycodone IR to '10mg'$  q4h scheduled with '5mg'$  q4h PRN-- this will actually end up giving him slightly less on a schedule than what he's been getting with the PRNs, but still leave a PRN available if he needs it; monitor if this works better, may want to consider alternative pain control method  given his tolerance to oxycodone (?nucynta or other newer opioid med)-- defer to weekday team -04/23/22 much better control of pain with scheduled dosing-- not using much PRN; monitor -04/24/22 Tramadol '50mg'$  PRN was started today for mild pain -2/20 Decreased skelaxin dose, stop robaxin and start baclofen '5mg'$  TID, Kpad, discussed plan to wean oxycodone -2/21 Seen by neuropsych and his coping issues regarding chronic pain was discussed -2/21 pain improved, continue current medications -2/22 stop skelaxin  -2/23 decrease oxycontin to '20mg'$  q12h -04/29/22 changed PRN comments to say oxy first, tramadol second, advised speaking with nursing staff as well regarding which he's being given. Defer d/c planning with regards to pain meds to weekday team -04/30/22 still concerned with pain regimen ('20mg'$  dosing of Oxy XR)-- needs to discuss with weekday team --neuropathic pain: Gabapentin up to 900 mg TID now. Cymbalta 20 mg bid --also on Skelaxin '800mg'$  QID and robaxin '1000mg'$  TID-->back spasms controlled on this--d/c'd as above  4. Mood/Behavior/Sleep: LCSW to follow for evaluation and support.              --trazodone prn for insomnia -04/22/22 changed trazodone to scheduled '25mg'$  QHS+'25mg'$  PRN and added scheduled Melatonin '5mg'$  QHS -2/20 increase trazodone dose to '50mg'$  scheduled -04/29/22 Sleep improved; Continue trazodone current dose              -antipsychotic agents: N/A 5. Neuropsych/cognition: This patient is capable of making decisions on his own behalf. 6. Skin/Wound Care: Routine pressure relief measures.  7. Fluids/Electrolytes/Nutrition: Monitor I/O. Check weekly labs next 05/01/22 8. C7 compression Fx: C collar at all times per Dr. Annette Stable.  9. Comminuted distal humerus fracture/olecranon Fx/?radial neck Fx:  S/p ORIF humerus 04/17/22-->NWB RUE  -2/22 Orthopedics recommending continuing nonweightbearing right upper extremity, okay for range of motion shoulder elbow wrist and hand, less than 1 pound weight restriction -Splint removed 04/27/22 10. HTN/ Syncope: Low dose Avapro 37.'5mg'$  QD resumed on 02/16--will set hold parameters. --Monitor orthostatic BP. Add thigh high TEDs and abdominal binder.  -04/22/22 orthostatics+ during PT eval this morning but Avapro had been given, and TEDs not in place; advised use of TEDs and reminded nursing staff about the Avapro order parameters; monitor -2/21 stop avapro -04/30/22 well controlled overall, remain off meds for now.  Vitals:   04/27/22 1349 04/27/22 1946 04/28/22 0454 04/28/22 0600  BP: 106/74 113/68 97/60 103/65   04/28/22 0601 04/28/22 0602 04/28/22 1347 04/28/22 1938  BP: 101/72 100/68 114/72 123/77   04/29/22 0322 04/29/22 1345 04/29/22 1929 04/30/22 0315  BP: 114/65 98/60 112/71 101/62    11. T12-L5 right TP Fx:  TLSO prn 12. H/o gastric bypass: Resume Vitamin B12 IM monthly and vitamins. Intake at baseline.  13. Abdominal pain/Acute on constipation:  -KUB ordered and demonstrates moderate gas in bowels, ?ileus --Took MOM at daily at night-->"nothing else works"--will resume --pt received mag citrate at 0930 and SMOG enema at 1130-->large results at 1300 today --continue Miralax 17g TID and colace '100mg'$  bid, dulcolax supp PRN, fleet's PRN             -SS enema in AM if no further bm's.  -04/22/22 LBM yesterday x3, abd pain improved, no n/v reported; monitor on current  regimen -04/23/22 no BM since 2/16 but refused miralax a few times-- feels he might have one today, advised not missing meds today and also ordered sorbitol 33m PRN in case he needs it-- discussed he will likely need enema if he doesn't have a BM by tomorrow -2/19 order  sorbitol 76m -04/29/22 LBM this morning, cont regimen -04/30/22 has daily BMs, has been declining TID miralax, will d/c this med 14. Urinary retention: Flomax increased to 0.8 mg today-->change to HS as continues to almost black out at EOB per pt/wife.              --monitor voiding with bladder scans/PVR checks.  -04/22/22 PVRs not being documented, required ISC last night, urinating better this morning; monitor -2/19 appears to be continent and urinating better. 15. Soft tissue hematoma/ABLA: H/H with downward trend. Recheck CBC in am             --transfuse prn Hgb<7.0 or if symptomatic.  -04/22/22 Hgb 7.8, remaining stable (9.4>8.7>7.8>7.7>7.4>7.8), continue to monitor -2/19 Hgb stable 8.7, monitor with weekly labs next 05/01/22 16. Hyponatremia: 133-135 prior to admission. Monitor on weekly labs  -2/19 sodium appears to be stable at 133  -2/22 start salt tabs, monitor with labs next 05/01/22 17.  Orthostatic hypotension.  Patient reports he is drinking fluids.  BP appeared to be more stable with Ace wrap's- continue this.  -Consider additional medication if interferes with therapy  -stop avepro  -2/22 start salt tabs -2/23 denies orthostatic symptoms, monitor response to medication change -04/29/22 seems to be improving, monitor  LOS: 9 days A FACE TO [place]2/25/2024, 7:05 AM

## 2022-04-30 NOTE — Progress Notes (Signed)
Patient requests PRN pain medication for breakthrough pain. Patient did request PRN Tramadol at 1 pm, with little relief. States scheduled pain medications for 2 pm  will give him more relief

## 2022-04-30 NOTE — Progress Notes (Signed)
Occupational Therapy Session Note  Patient Details  Name: Bradley [name] MRN: [id number] Date of Birth: 10-13-1970  Today's Date: 04/30/2022 OT Individual Time: 0915-1000 OT Individual Time Calculation (min): 45 min    Short Term Goals: Week 2:  OT Short Term Goal 1 (Week 2): Continue progressing toward LTGs  Skilled Therapeutic Interventions/Progress Updates:   Pt seen for am self care retraining focus this session. Pt's wife bedside therefore informal family education conducted as well. Pt up in recliner with Aspen collar in place, just taken pain meds with 6/10 pain level reported. Pt requesting shower this session. OT provided education on safe mobilization with gait belt from recliner to and from stall shower bench using [facility] with close S. Reinforced falls prevention and precautions throughout session. Once pt seated on bench, pt used reacher to doff LB garments off feet. Pt utilized LH sponge and lateral leans and grab bar support for lower body bathing. OT removed ktape from B thenar regions and R scap/sh complex while in shower. OT provided assist for drying feet, otherwise pt overall CGA. Pt amb back to bed in supine and OT and wife worked together to replace wet collar pads while maintaining anti gravity position of pt's head with immobility. OT training on re-donning brace and provided TED hose application in supine. Pt able to dress with CGA for LB dressing EOB and close s for pull over shirt. Transferred to recliner with CGA. OT reapplied ktape to R scap/sh complex as well as both thenar regions of hands. Left pt up in recliner with wife bedside and all safety needs in place.    Therapy Documentation Precautions:  Precautions Precautions: Fall, Cervical, Back Required Braces or Orthoses: Cervical Brace, Spinal Brace, Sling, Splint/Cast Cervical Brace: Hard collar, At all times Spinal Brace: Thoracolumbosacral orthotic (for comfort) Splint/Cast: R long arm Restrictions Weight  Bearing Restrictions: Yes RUE Weight Bearing: Non weight bearing    Therapy/Group: Individual Therapy  [name] 04/30/2022, 7:52 AM

## 2022-05-01 ENCOUNTER — Other Ambulatory Visit (HOSPITAL_COMMUNITY): Payer: Self-pay

## 2022-05-01 ENCOUNTER — Telehealth (HOSPITAL_COMMUNITY): Payer: Self-pay | Admitting: Pharmacy Technician

## 2022-05-01 ENCOUNTER — Encounter: Payer: Self-pay | Admitting: Oncology

## 2022-05-01 DIAGNOSIS — M79601 Pain in right arm: Secondary | ICD-10-CM | POA: Diagnosis not present

## 2022-05-01 DIAGNOSIS — T1490XA Injury, unspecified, initial encounter: Secondary | ICD-10-CM | POA: Diagnosis not present

## 2022-05-01 DIAGNOSIS — I951 Orthostatic hypotension: Secondary | ICD-10-CM | POA: Diagnosis not present

## 2022-05-01 DIAGNOSIS — I10 Essential (primary) hypertension: Secondary | ICD-10-CM | POA: Diagnosis not present

## 2022-05-01 LAB — CBC
HCT: 27.1 % — ABNORMAL LOW (ref 39.0–52.0)
Hemoglobin: 8.7 g/dL — ABNORMAL LOW (ref 13.0–17.0)
MCH: 28.9 pg (ref 26.0–34.0)
MCHC: 32.1 g/dL (ref 30.0–36.0)
MCV: 90 fL (ref 80.0–100.0)
Platelets: 469 10*3/uL — ABNORMAL HIGH (ref 150–400)
RBC: 3.01 MIL/uL — ABNORMAL LOW (ref 4.22–5.81)
RDW: 15 % (ref 11.5–15.5)
WBC: 5.2 10*3/uL (ref 4.0–10.5)
nRBC: 0 % (ref 0.0–0.2)

## 2022-05-01 LAB — BASIC METABOLIC PANEL
Anion gap: 5 (ref 5–15)
BUN: 11 mg/dL (ref 6–20)
CO2: 27 mmol/L (ref 22–32)
Calcium: 8.3 mg/dL — ABNORMAL LOW (ref 8.9–10.3)
Chloride: 102 mmol/L (ref 98–111)
Creatinine, Ser: 0.67 mg/dL (ref 0.61–1.24)
GFR, Estimated: >60 mL/min (ref >60)
Glucose, Bld: 95 mg/dL (ref 70–99)
Potassium: 3.9 mmol/L (ref 3.5–5.1)
Sodium: 134 mmol/L — ABNORMAL LOW (ref 135–145)

## 2022-05-01 MED ORDER — TRAMADOL HCL 50 MG PO TABS
50.0000 mg | ORAL_TABLET | Freq: Four times a day (QID) | ORAL | 0 refills | Status: DC | PRN
  Filled 2022-05-01: qty 28, 7d supply, fill #0

## 2022-05-01 MED ORDER — GABAPENTIN 600 MG PO TABS
900.0000 mg | ORAL_TABLET | Freq: Three times a day (TID) | ORAL | 0 refills | Status: DC
  Filled 2022-05-01: qty 135, 30d supply, fill #0

## 2022-05-01 MED ORDER — MORPHINE SULFATE ER 30 MG PO TBCR
30.0000 mg | EXTENDED_RELEASE_TABLET | Freq: Two times a day (BID) | ORAL | Status: DC
  Administered 2022-05-01 – 2022-05-02 (×2): 30 mg via ORAL
  Filled 2022-05-01 (×2): qty 1

## 2022-05-01 MED ORDER — OXYCODONE HCL ER 15 MG PO T12A: 15.0000 mg | EXTENDED_RELEASE_TABLET | Freq: Two times a day (BID) | ORAL | Status: DC

## 2022-05-01 MED ORDER — LIDOCAINE 5 % EX PTCH
2.0000 | MEDICATED_PATCH | Freq: Every day | CUTANEOUS | 0 refills | Status: DC
  Filled 2022-05-01 (×2): qty 60, 30d supply, fill #0

## 2022-05-01 MED ORDER — TAMSULOSIN HCL 0.4 MG PO CAPS
0.8000 mg | ORAL_CAPSULE | Freq: Every day | ORAL | 0 refills | Status: DC
  Filled 2022-05-01: qty 60, 30d supply, fill #0

## 2022-05-01 MED ORDER — MORPHINE SULFATE ER 30 MG PO TBCR
30.0000 mg | EXTENDED_RELEASE_TABLET | Freq: Two times a day (BID) | ORAL | 0 refills | Status: AC
  Filled 2022-05-01: qty 14, 7d supply, fill #0

## 2022-05-01 MED ORDER — DOCUSATE SODIUM 100 MG PO CAPS
100.0000 mg | ORAL_CAPSULE | Freq: Two times a day (BID) | ORAL | 0 refills | Status: DC
  Filled 2022-05-01: qty 60, 30d supply, fill #0

## 2022-05-01 MED ORDER — MAGNESIUM HYDROXIDE 400 MG/5ML PO SUSP: 30.0000 mL | Freq: Every day | ORAL | 0 refills | Status: AC

## 2022-05-01 MED ORDER — ADULT MULTIVITAMIN W/MINERALS CH
1.0000 | ORAL_TABLET | Freq: Every day | ORAL | 0 refills | Status: AC
  Filled 2022-05-01: qty 30, 30d supply, fill #0

## 2022-05-01 MED ORDER — OXYCODONE HCL ER 15 MG PO T12A
15.0000 mg | EXTENDED_RELEASE_TABLET | Freq: Two times a day (BID) | ORAL | 0 refills | Status: DC
  Filled 2022-05-01 (×2): qty 14, 7d supply, fill #0

## 2022-05-01 MED ORDER — TRAZODONE HCL 50 MG PO TABS
50.0000 mg | ORAL_TABLET | Freq: Every day | ORAL | 0 refills | Status: DC
  Filled 2022-05-01: qty 30, 30d supply, fill #0

## 2022-05-01 MED ORDER — SODIUM CHLORIDE 1 G PO TABS
1.0000 g | ORAL_TABLET | Freq: Two times a day (BID) | ORAL | 0 refills | Status: DC
  Filled 2022-05-01: qty 60, 30d supply, fill #0

## 2022-05-01 MED ORDER — ACETAMINOPHEN 325 MG PO TABS: 650.0000 mg | ORAL_TABLET | Freq: Four times a day (QID) | ORAL | Status: AC

## 2022-05-01 MED ORDER — BACLOFEN 5 MG PO TABS
5.0000 mg | ORAL_TABLET | Freq: Three times a day (TID) | ORAL | 0 refills | Status: AC
  Filled 2022-05-01: qty 90, 30d supply, fill #0

## 2022-05-01 MED ORDER — OXYCODONE HCL 5 MG PO TABS
5.0000 mg | ORAL_TABLET | Freq: Four times a day (QID) | ORAL | Status: DC | PRN
  Administered 2022-05-01 – 2022-05-02 (×2): 5 mg via ORAL
  Filled 2022-05-01 (×2): qty 1

## 2022-05-01 MED ORDER — MELATONIN 5 MG PO TABS
5.0000 mg | ORAL_TABLET | Freq: Every day | ORAL | 0 refills | Status: DC
  Filled 2022-05-01: qty 30, 30d supply, fill #0

## 2022-05-01 NOTE — Telephone Encounter (Signed)
Patient Advocate Encounter  Prior Authorization for Lidocaine 5% patches has been approved.    PA# [id number] Effective dates: 05/01/2022 through 03/06/2023      Lyndel Safe, [place] Patient Advocate Specialist [place] Patient Advocate Team Direct Number: [phone]  Fax: [phone]

## 2022-05-01 NOTE — Progress Notes (Signed)
Occupational Therapy Session Note  Patient Details  Name: Bradley [name] MRN: [id number] Date of Birth: 05-08-1970  Today's Date: 05/01/2022 OT Individual Time: 1000-1041 OT Individual Time Calculation (min): 41 min    Short Term Goals: Week 2:  OT Short Term Goal 1 (Week 2): Continue progressing toward LTGs  Skilled Therapeutic Interventions/Progress Updates:   Pt seen for 2nd OT visit this Grad Day. Wife present for further Family Education. Pt's transport w/c just delievered and appeared narrow for pt as per wife. Pt and OT assessment. Pt was able to transfer in and out with S but snug around the arm rests. Messaged team via secure chat with options. OT applied TED hose Bly and took seated VS with readings SpO2 100%, HR 76 BPM, BP 107/67 and RR 16. OT trained wife and pt in safe stall shower transfer using demo frame with L UE supported on grab bar. Pt able to perform SLS to step in and out of frame with close S. Pt and wife assure the seats in shower are adequate and OT provided info should they pursue additional bench needs. Wife able to manage transport w/c from demo apt space and safely transport pt back to room, assist with transfer back to recliner and utilize all falls prev and precaution mngt techniques. Prior to sitting, OT took orthostatic vitals with BP 126/78 after standing 2 minutes with S. Left pt and wife with Dr Anders Simmonds for consult at end of session with all safety measures and needs in reach.   Therapy Documentation Precautions:  Precautions Precautions: Fall, Cervical, Back Required Braces or Orthoses: Cervical Brace, Spinal Brace, Sling, Splint/Cast Cervical Brace: Hard collar, At all times Spinal Brace: Thoracolumbosacral orthotic (for comfort) Splint/Cast: R long arm Restrictions Weight Bearing Restrictions: Yes RUE Weight Bearing: Non weight bearing    Therapy/Group: Individual Therapy  [name] 05/01/2022, 7:38 AM

## 2022-05-01 NOTE — Progress Notes (Addendum)
Inpatient Rehabilitation Discharge Medication Review by a Pharmacist  A complete drug regimen review was completed for this patient to identify any potential clinically significant medication issues.  High Risk Drug Classes Is patient taking? Indication by Medication  Antipsychotic No   Anticoagulant No   Antibiotic No   Opioid Yes OxyIR/MSContin/Tramadol- chronic pain  Antiplatelet No   Hypoglycemics/insulin No   Vasoactive Medication Yes Flomax- BPH  Chemotherapy No   Other Yes Gabapentin- neuropathic pain Cymbalta- neuropathic pain Trazodone- sleep     Type of Medication Issue Identified Description of Issue Recommendation(s)  Drug Interaction(s) (clinically significant)     Duplicate Therapy     Allergy     No Medication Administration End Date     Incorrect Dose     Additional Drug Therapy Needed     Significant med changes from prior encounter (inform family/care partners about these prior to discharge).    Other       Clinically significant medication issues were identified that warrant physician communication and completion of prescribed/recommended actions by midnight of the next day:  No   Time spent performing this drug regimen review (minutes):  30  Thank You   Taronda Comacho BS, PharmD, BCPS Clinical Pharmacist 05/01/2022 10:45 AM  Contact: [phone] after 3 PM  "Be curious, not judgmental..." -Jamal Maes

## 2022-05-01 NOTE — Progress Notes (Signed)
Occupational Therapy Session Note  Patient Details  Name: Bradley [name] MRN: [id number] Date of Birth: 01-13-1971  Today's Date: 05/01/2022 OT Individual Time: [id number] OT Individual Time Calculation (min): 56 min    Short Term Goals: Week 2:  OT Short Term Goal 1 (Week 2): Continue progressing toward LTGs  Skilled Therapeutic Interventions/Progress Updates:     Pt received resting in bed with wife present in room with Pt wearing [place] J collar upon OT arrival. Pt reporting mild pain in back with OT offering repositioning and rest breaks- Rn in/out to provide medications. Focus this session BADL retraining with family education incorporated since wife present during session. Reviewed fall prevention and home safety set-up with Pt to increase safety upon d/c home with both Pt and wife verbalizing understanding. Facilitated wife assisting Pt with BADLs this session in preparation for d/c home. Pt able to ambulate to bathroom and transfer to tub bench using grab bars with supervision. Pt doffed clothing in standing with VB cueing required for safety to remind Pt to sitt to bring pants off feet. Pt able to complete bathing tasks with assistance to wash hair, back, and for shower head management. Wife demonstrating safe intact safety awareness and safely providing CGA with Pt in standing. Pt wife able to change [place] J collar padding with Pt in supine independently. Pt donned loose fitting shirt and pants with min A with wife providing CGA in standing. Pt wife noted to allow Pt to pull up on her arm during sit>stand with OT providing demonstration of improved ways to support Pt in sit>stand to increase safety and prevent injury. Provided Pt wife opportunity to practice with feedback provided and wife receptive to technique. Pt completed grooming hygiene tasks Eob with intermittent assistance from his wife. Ambulated to recliner close supervision. Pt was left resting in recliner with call bell in reach  and wife present in room with all needs met.    Therapy Documentation Precautions:  Precautions Precautions: Fall, Cervical, Back Required Braces or Orthoses: Cervical Brace, Spinal Brace, Sling, Splint/Cast Cervical Brace: Hard collar, At all times Spinal Brace: Thoracolumbosacral orthotic (for comfort) Splint/Cast: R long arm Restrictions Weight Bearing Restrictions: Yes RUE Weight Bearing: Non weight bearing   Pain: Pain Assessment Pain Scale: 0-10 Pain Score: 9  Pain Type: Acute pain Pain Location: Arm Pain Orientation: Right Pain Radiating Towards: shoulder, ribs Pain Descriptors / Indicators: Aching Pain Intervention(s): Medication (See eMAR) Therapy/Group: Individual Therapy  Janey Genta 05/01/2022, 7:54 AM

## 2022-05-01 NOTE — Progress Notes (Signed)
Occupational Therapy Discharge Summary  Patient Details  Name: Bradley [name] MRN: [id number] Date of Birth: 08-Jan-1971  Date of Discharge from OT service:May 01, 2022   Patient has met 10 of 10 long term goals due to improved activity tolerance, improved balance, postural control, ability to compensate for deficits, functional use of  RIGHT upper extremity, and improved coordination.  Patient to discharge at overall Supervision level.  Patient's care partner is independent to provide the necessary physical assistance at discharge.  Bradley [name] has made excellent progress quickly in rehab and progressed to a supervision level overall with ADLs and transfers using a large base quad cane. He has a supportive wife who was present frequently for education. He will need to continue working on RUE AROM in [place], as well as higher level balance deficits and pain management.    Recommendation:  Patient will benefit from ongoing skilled OT services in outpatient setting to continue to advance functional skills in the area of BADL and iADL.  Equipment: Transport w/c  Reasons for discharge: treatment goals met and discharge from hospital  Patient/family agrees with progress made and goals achieved: Yes  OT Discharge Precautions/Restrictions  Precautions Precautions: Fall;Cervical;Back Precaution Comments: verbally reviewed back and cervical precautions. TLSO not used during session. Required Braces or Orthoses: Cervical Brace;Spinal Brace;Sling;Splint/Cast Cervical Brace: Hard collar;At all times Spinal Brace: Thoracolumbosacral orthotic (for comfort) Restrictions Weight Bearing Restrictions: Yes RUE Weight Bearing: Non weight bearing  ADL ADL Eating: Independent Where Assessed-Eating: Wheelchair Grooming: Independent Where Assessed-Grooming: Sitting at sink Upper Body Bathing: Modified independent Where Assessed-Upper Body Bathing: Shower Lower Body Bathing: Modified independent Where  Assessed-Lower Body Bathing: Shower Upper Body Dressing: Supervision/safety Where Assessed-Upper Body Dressing: Chair Lower Body Dressing: Supervision/safety Where Assessed-Lower Body Dressing: Chair Toileting: Supervision/safety Where Assessed-Toileting: Glass blower/designer: Distant supervision Armed forces technical officer Method: Counselling psychologist: Drop arm bedside commode Tub/Shower Transfer: Distant supervision Tub/Shower Transfer Method: Optometrist: Civil engineer, contracting with back Social research officer, government: Distant supervision Social research officer, government Method: Heritage manager: Civil engineer, contracting with back Vision Baseline Vision/History: 1 Wears glasses Patient Visual Report: No change from baseline Vision Assessment?: No apparent visual deficits Perception  Perception: Within Functional Limits Praxis Praxis: Intact Cognition Cognition Overall Cognitive Status: Within Functional Limits for tasks assessed Arousal/Alertness: Awake/alert Orientation Level: Person;Place;Situation Person: Oriented Place: Oriented Situation: Oriented Memory: Appears intact Selective Attention: Appears intact Problem Solving: Appears intact Safety/Judgment: Appears intact Brief Interview for Mental Status (BIMS) Repetition of Three Words (First Attempt): 3 Temporal Orientation: Year: Correct Temporal Orientation: Month: Accurate within 5 days Temporal Orientation: Day: Correct Recall: "Sock": Yes, no cue required Recall: "Blue": Yes, no cue required Recall: "Bed": Yes, no cue required BIMS Summary Score: 15 Sensation Sensation Light Touch: Impaired Detail Hot/Cold: Appears Intact Proprioception: Appears Intact Additional Comments: decreased sensation RLE>LLE and pt reports hx of sciatica on RLE Coordination Gross Motor Movements are Fluid and Coordinated: Yes Fine Motor Movements are Fluid and Coordinated: Yes Coordination and Movement Description:  generalized weakness/deconditioning, pain Motor  Motor Motor: Within Functional Limits Mobility  Bed Mobility Bed Mobility: Rolling Right;Rolling Left;Supine to Sit;Sit to Supine Rolling Right: Independent with assistive device Rolling Left: Independent with assistive device Supine to Sit: Independent with assistive device Sit to Supine: Independent with assistive device Transfers Sit to Stand: Supervision/Verbal cueing Stand to Sit: Supervision/Verbal cueing  Trunk/Postural Assessment  Cervical Assessment Cervical Assessment: Within Functional Limits Thoracic Assessment Thoracic Assessment: Within Functional Limits Lumbar Assessment Lumbar Assessment: Within Functional Limits Postural Control  Postural Control: Within Functional Limits  Balance Balance Balance Assessed: Yes Static Sitting Balance Static Sitting - Balance Support: Feet supported;No upper extremity supported Static Sitting - Level of Assistance: 6: Modified independent (Device/Increase time) Dynamic Sitting Balance Dynamic Sitting - Balance Support: Feet supported;No upper extremity supported Dynamic Sitting - Level of Assistance: 6: Modified independent (Device/Increase time) Static Standing Balance Static Standing - Balance Support: Left upper extremity supported Static Standing - Level of Assistance: 5: Stand by assistance Dynamic Standing Balance Dynamic Standing - Balance Support: Left upper extremity supported Dynamic Standing - Level of Assistance: 5: Stand by assistance Extremity/Trunk Assessment RUE Assessment RUE Assessment: Exceptions to [facility] General Strength Comments: He currently has about 30 degrees of range at the elbow. Remains in about 70 degrees flexion at rest. Full finger and wrist AROM. About 40 degrees shoulder AROM against gravity LUE Assessment LUE Assessment: Within Functional Limits   Bradley [name] 05/01/2022, 5:57 PM

## 2022-05-01 NOTE — Progress Notes (Signed)
Physical Therapy Session Note  Patient Details  Name: Bradley [name] MRN: [id number] Date of Birth: May 31, 1970  Today's Date: 05/01/2022 PT Individual Time: 0915-0955 PT Individual Time Calculation (min): 40 min   Short Term Goals: Week 1:  PT Short Term Goal 1 (Week 1): Pt will complete bed mobility with minA PT Short Term Goal 2 (Week 1): Pt will complete bed<>chair transfers with CGA and LRAD PT Short Term Goal 3 (Week 1): Pt will ambulate 79f with minA and LRAD PT Short Term Goal 4 (Week 1): Pt will initiate stair training  Skilled Therapeutic Interventions/Progress Updates:   Received pt sitting in recliner with wife present at bedside. Pt agreeable to PT treatment and reported pain in ribs was "tolerable". Session with emphasis on discharge planning, functional mobility/transfers, generalized strengthening and endurance, stair navigation, simulated car transfers, and gait training. Went through sensation, MMT, and pain interference questionnaire. Pt performed all transfers with L[facility] Centerand CGA/supervision throughout session with wife providing assist. Pt ambulated 1574fwith LBLegacy [facility] Centernd CGA provided by wife to main therapy gym - although pt would have been able to perform with supervision. Pt navigated 4 6in steps with L handrail and CGA ascending and then when descending placed LUE on wife's shoulder for support to simulate home entry using a step to pattern.   Transported to ortho gym for energy conservation purposes and pt ambulated 1056fn uneven surfaces (ramp) with LBQC and CGA provided by wife, then performed ambulatory simulated car transfer with LBQ[facility] Centerd min A to get LEs into car, but pt able to get legs out of car without assist. Pt then ambulated [id number] LBQCP[facility] supervision (CGA to hold pt's pants up) back to room and concluded session with pt sitting in recliner with all needs within reach and wife present at bedside.   Therapy Documentation Precautions:   Precautions Precautions: Fall, Cervical, Back Required Braces or Orthoses: Cervical Brace, Spinal Brace, Sling, Splint/Cast Cervical Brace: Hard collar, At all times Spinal Brace: Thoracolumbosacral orthotic (for comfort) Splint/Cast: R long arm Restrictions Weight Bearing Restrictions: Yes RUE Weight Bearing: Non weight bearing  Therapy/Group: Individual Therapy AnnaAlfonse Alpers DPT  05/01/2022, 7:00 AM

## 2022-05-01 NOTE — Progress Notes (Signed)
Physical Therapy Session Note  Patient Details  Name: THOMA MITRA MRN: [id number] Date of Birth: 10-02-1970  Today's Date: 05/01/2022 PT Individual Time: [id number] PT Individual Time Calculation (min): 29 min   Short Term Goals: Week 1:  PT Short Term Goal 1 (Week 1): Pt will complete bed mobility with minA PT Short Term Goal 2 (Week 1): Pt will complete bed<>chair transfers with CGA and LRAD PT Short Term Goal 3 (Week 1): Pt will ambulate 66f with minA and LRAD PT Short Term Goal 4 (Week 1): Pt will initiate stair training  Skilled Therapeutic Interventions/Progress Updates:    Patient received in recliner at finishing lunch with spouse present. Pt agreeable to therapy and reports he is looking forward to being home tomorrow. Pt ambulated from room to gym 150' with L[facility] Centerand supervision. Stair mobility completed with single rail on Lt for forward step pattern to ascend and educated on pt on side step pattern to descend with single rail on Lt. No LOB and CGA provided for ascend/descend. Pt completed total of 12 steps (4x6" and 8x3") followed by second bout of 4x6" steps for repetition. Pt also completed functional reach/squat to challenge balance with picking up objects from the floor. Pt completed 6x cone pick up without AD, Min assist provided. Pt then able to pick up various objects with Supervision/CGA using reacher as well as use of LBQC to steady self. Pt ambulated back to room with LMcl[facility]and supervision and completed bed mobility. Unable to complete with bed completely flat, HOB at 30*, pt able to roll Lt with use of bed rail and completed log roll technique extra time to press up trunk. No cues needed or assist. Pt Then ambulated with HHA around room to recliner. Call bell within reach and family at bedside. Pt aware of safety procedures to call for assistance to mobilize.   Therapy Documentation Precautions:  Precautions Precautions: Fall, Cervical, Back Required Braces or Orthoses:  Cervical Brace, Spinal Brace, Sling, Splint/Cast Cervical Brace: Hard collar, At all times Spinal Brace: Thoracolumbosacral orthotic (for comfort) Splint/Cast: R long arm Restrictions Weight Bearing Restrictions: Yes RUE Weight Bearing: Non weight bearing  Pain: Pain Assessment Pain Score: 7    Therapy/Group: Individual Therapy  RVerner Mould DPT Acute Rehabilitation Services Office [phone] 05/01/22 12:17 PM

## 2022-05-01 NOTE — Progress Notes (Signed)
PROGRESS NOTE   Subjective/Complaints:  TLSO not fitting with with C collar- this is only as needed for comfort. Pt asks about discharge pain medications. Has area of swelling on his Right lower back noted on showing, no new symptoms- hx of hematoma is this area.    ROS: +pain-multiple locations, mostly back/ribs-gradualy improving, +continued insomnia- improved +intermittent urinary retention-resolved. Denies fevers, chills,HA, CP, SOB, abd pain, N/V/D/C, new/worsening paresthesias/weakness, or any other complaints at this time.     Objective:   No results found. Recent Labs    05/01/22 0647  WBC 5.2  HGB 8.7*  HCT 27.1*  PLT 469*    Recent Labs    05/01/22 0647  NA 134*  K 3.9  CL 102  CO2 27  GLUCOSE 95  BUN 11  CREATININE 0.67  CALCIUM 8.3*     Intake/Output Summary (Last 24 hours) at 05/01/2022 1300 Last data filed at 04/30/2022 1945 Gross per 24 hour  Intake 240 ml  Output --  Net 240 ml         Physical Exam: Vital Signs Blood pressure 107/69, pulse 79, temperature 98.1 F (36.7 C), temperature source Oral, resp. rate 18, height '5\' 10"'$  (1.778 m), weight 111.5 kg, SpO2 96 %.  Constitutional:  NAD, Sitting in w/c at sink HENT: [place]/AT, MMM Eyes: EOMI,   PERRL Neck: Immobilized with C- collar Cardiovascular: RRR, holosystolic murmur heard best at LUSB, no rub/gallops, distal pulses intact Pulmonary: CTAB, no w/r/r, good air movement Abdominal: soft, nondistended, nonTTP, no r/g/r, +BS throughout  Extremities: RUE splint has been removed, resting on armchair, slight bruising noted to antecubital fossa, moderate bruising noted to axillary area. Incisions CDI  Prior exam: Musculoskeletal: Right chest/flank pain with attempts at ROM RLE.  R arm immobilized in splint/sling. Ace wraps have been adjusted- fitting better. RUE 1+ edema, LUE 1+ edema  Right lower back swelling -no redness or signs of  infection (hx of hematoma in this area) Skin:    General: Skin is warm.     Comments: Tattoo left arm. Scattered abrasions, bruises. RUE dressed/splinted  Neurological:     Mental Status: He is alert and oriented to person, place, and time.     Comments: Alert and oriented x 3. Normal insight and awareness. Intact Memory. Normal language and speech. Cranial nerve exam unremarkable. RUE limited d/t ortho LUE 4/5. RLE 2/5 prox to 4+/5 distally. LLE 3+/5 prox to 5/5 distally. No focal sensory loss appreciated. No abnl resting tone.   Psychiatric:     Comments: pleasant   Assessment/Plan: 1. Functional deficits which require 3+ hours per day of interdisciplinary therapy in a comprehensive inpatient rehab setting. Physiatrist is providing close team supervision and 24 hour management of active medical problems listed below. Physiatrist and rehab team continue to assess barriers to discharge/monitor patient progress toward functional and medical goals  Care Tool:  Bathing    Body parts bathed by patient: Right arm, Left arm, Chest, Abdomen, Front perineal area, Buttocks, Right upper leg, Left upper leg, Face, Right lower leg, Left lower leg   Body parts bathed by helper: Right arm, Left arm, Front perineal area, Buttocks, Right upper leg, Left  upper leg, Left lower leg, Right lower leg     Bathing assist Assist Level: Set up assist     Upper Body Dressing/Undressing Upper body dressing   What is the patient wearing?: Pull over shirt    Upper body assist Assist Level: Supervision/Verbal cueing    Lower Body Dressing/Undressing Lower body dressing      What is the patient wearing?: Underwear/pull up, Pants     Lower body assist Assist for lower body dressing: Supervision/Verbal cueing     Toileting Toileting    Toileting assist Assist for toileting: Supervision/Verbal cueing     Transfers Chair/bed transfer  Transfers assist     Chair/bed transfer assist level:  Supervision/Verbal cueing     Locomotion Ambulation   Ambulation assist      Assist level: Supervision/Verbal cueing Assistive device: Cane-quad Max distance: 129f   Walk 10 feet activity   Assist  Walk 10 feet activity did not occur: Safety/medical concerns  Assist level: Supervision/Verbal cueing Assistive device: Cane-quad   Walk 50 feet activity   Assist Walk 50 feet with 2 turns activity did not occur: Safety/medical concerns  Assist level: Supervision/Verbal cueing Assistive device: Cane-quad    Walk 150 feet activity   Assist Walk 150 feet activity did not occur: Safety/medical concerns  Assist level: Supervision/Verbal cueing Assistive device: Cane-quad    Walk 10 feet on uneven surface  activity   Assist Walk 10 feet on uneven surfaces activity did not occur: Safety/medical concerns   Assist level: Contact Guard/Touching assist Assistive device: Cane-quad   Wheelchair     Assist Is the patient using a wheelchair?: Yes Type of Wheelchair: Manual    Wheelchair assist level: Dependent - Patient 0% Max wheelchair distance: >1521f   Wheelchair 50 feet with 2 turns activity    Assist        Assist Level: Dependent - Patient 0%   Wheelchair 150 feet activity     Assist      Assist Level: Dependent - Patient 0%   Blood pressure 107/69, pulse 79, temperature 98.1 F (36.7 C), temperature source Oral, resp. rate 18, height '5\' 10"'$  (1.778 m), weight 111.5 kg, SpO2 96 %.  Medical Problem List and Plan: 1. Functional deficits secondary to polytrauma after falling through a ceiling             -patient may shower if RUE is covered and pt can tolerate             -ELOS/Goals: 2/27, sup, min A ADLs  -Continue CIR  -DC home tomorrow  2.  Antithrombotics: -DVT/anticoagulation:  Pharmaceutical: Lovenox '30mg'$  q12h             -antiplatelet therapy: N/A 3. Chronic pain/Pain Management: Tylenol 650 mg qid + '325mg'$  q6h PRN, Lidoderm  2patches QD - continue Oxycontin 12hr 30 mg BID             --using oxycodone 15 mg TID (was on oxycodone 10 mg 5x/day)  -pt told me he was wanting to wean off narcotics prior to this admission -04/22/22 pt with increasing pain d/t PRN dosing; agreeable to change Oxycodone IR to '10mg'$  q4h scheduled with '5mg'$  q4h PRN-- this will actually end up giving him slightly less on a schedule than what he's been getting with the PRNs, but still leave a PRN available if he needs it; monitor if this works better, may want to consider alternative pain control method given his tolerance to oxycodone (?nucynta or other  newer opioid med)-- defer to weekday team -04/23/22 much better control of pain with scheduled dosing-- not using much PRN; monitor -04/24/22 Tramadol '50mg'$  PRN was started today for mild pain -2/20 Decreased skelaxin dose, stop robaxin and start baclofen '5mg'$  TID, Kpad, discussed plan to wean oxycodone -2/21 Seen by neuropsych and his coping issues regarding chronic pain was discussed -2/21 pain improved, continue current medications -2/22 stop skelaxin  -2/23 decrease oxycontin to '20mg'$  q12h -04/29/22 changed PRN comments to say oxy first, tramadol second, advised speaking with nursing staff as well regarding which he's being given. Defer d/c planning with regards to pain meds to weekday team -04/30/22 still concerned with pain regimen ('20mg'$  dosing of Oxy XR)-- needs to discuss with weekday team 2/26 medication changed to MS contin 12hr '30mg'$  in preperation for discharge, further medications per his pain clinic. His pain clinic was called- he has active order for his short acting oxycodone available for pickup. He also should have about 6 days of oxycodone left from his prior prescription. --neuropathic pain: Gabapentin up to 900 mg TID now. Cymbalta 20 mg bid --also on Skelaxin '800mg'$  QID and robaxin '1000mg'$  TID-->back spasms controlled on this--d/c'd as above  4. Mood/Behavior/Sleep: LCSW to follow for  evaluation and support.              --trazodone prn for insomnia -04/22/22 changed trazodone to scheduled '25mg'$  QHS+'25mg'$  PRN and added scheduled Melatonin '5mg'$  QHS -2/20 increase trazodone dose to '50mg'$  scheduled -04/29/22 Sleep improved; Continue trazodone current dose             -antipsychotic agents: N/A 5. Neuropsych/cognition: This patient is capable of making decisions on his own behalf. 6. Skin/Wound Care: Routine pressure relief measures.  7. Fluids/Electrolytes/Nutrition: Monitor I/O. Check weekly labs next 05/01/22 8. C7 compression Fx: C collar at all times per Dr. Annette Stable.  9. Comminuted distal humerus fracture/olecranon Fx/?radial neck Fx:  S/p ORIF humerus 04/17/22-->NWB RUE  -2/22 Orthopedics recommending continuing nonweightbearing right upper extremity, okay for range of motion shoulder elbow wrist and hand, less than 1 pound weight restriction -Splint removed 04/27/22 10. HTN/ Syncope: Low dose Avapro 37.'5mg'$  QD resumed on 02/16--will set hold parameters. --Monitor orthostatic BP. Add thigh high TEDs and abdominal binder.  -04/22/22 orthostatics+ during PT eval this morning but Avapro had been given, and TEDs not in place; advised use of TEDs and reminded nursing staff about the Avapro order parameters; monitor -2/21 stop avapro -2/26 controlled Vitals:   04/28/22 0600 04/28/22 0601 04/28/22 0602 04/28/22 1347  BP: 103/65 101/72 100/68 114/72   04/28/22 1938 04/29/22 0322 04/29/22 1345 04/29/22 1929  BP: 123/77 114/65 98/60 112/71   04/30/22 0315 04/30/22 1343 04/30/22 1923 05/01/22 0346  BP: 101/62 95/61 111/69 107/69    11. T12-L5 right TP Fx:  TLSO prn 12. H/o gastric bypass: Resume Vitamin B12 IM monthly and vitamins. Intake at baseline.  13. Abdominal pain/Acute on constipation:  -KUB ordered and demonstrates moderate gas in bowels, ?ileus --Took MOM at daily at night-->"nothing else works"--will resume --pt received mag citrate at 0930 and SMOG enema at 1130-->large  results at 1300 today --continue Miralax 17g TID and colace '100mg'$  bid, dulcolax supp PRN, fleet's PRN             -SS enema in AM if no further bm's.  -04/22/22 LBM yesterday x3, abd pain improved, no n/v reported; monitor on current regimen -04/23/22 no BM since 2/16 but refused miralax a few times-- feels he might have one  today, advised not missing meds today and also ordered sorbitol 32m PRN in case he needs it-- discussed he will likely need enema if he doesn't have a BM by tomorrow -2/19 order sorbitol 315m-04/29/22 LBM this morning, cont regimen -04/30/22 has daily BMs, has been declining TID miralax, will d/c this med 14. Urinary retention: Flomax increased to 0.8 mg today-->change to HS as continues to almost black out at EOB per pt/wife.              --monitor voiding with bladder scans/PVR checks.  -04/22/22 PVRs not being documented, required ISC last night, urinating better this morning; monitor -2/19 appears to be continent and urinating better. 15. Soft tissue hematoma/ABLA: H/H with downward trend. Recheck CBC in am             --transfuse prn Hgb<7.0 or if symptomatic.  -04/22/22 Hgb 7.8, remaining stable (9.4>8.7>7.8>7.7>7.4>7.8), continue to monitor -2/19 Hgb stable 8.7, monitor with weekly labs next 05/01/22 16. Hyponatremia: 133-135 prior to admission. Monitor on weekly labs  -2/19 sodium appears to be stable at 133  -2/22 start salt tabs, monitor with labs next 05/01/22  Continue salt tabs, NA up to 134 17.  Orthostatic hypotension.  Patient reports he is drinking fluids.  BP appeared to be more stable with Ace wrap's- continue this.  -Consider additional medication if interferes with therapy  -stop avepro  -2/22 start salt tabs -2/23 denies orthostatic symptoms, monitor response to medication change -04/29/22 seems to be improving, monitor  Continue salt tabs LOS: 10 days A FACE TO FACE EVALUATION WAS PERFORMED  YuJennye Boroughs/26/2024, 1:00 PM

## 2022-05-01 NOTE — Progress Notes (Signed)
Patient ID: Bradley [name], male   DOB: November 18, 1970, 52 y.o.   MRN: [id number]  Met with pt and wife to discuss the transport chair concern a wheelchair will not fit through the doorways in the home and in the bathroom. Pt has decided to keep the transport chair and put a pillow next to the arm so will not hit his ribs. Have not heard regarding hospital bed. Have reached out and [employer] to call wife regarding delivery time.

## 2022-05-01 NOTE — Progress Notes (Signed)
Occupational Therapy Session Note  Patient Details  Name: Bradley [name] MRN: [id number] Date of Birth: February 28, 1971  Today's Date: 05/01/2022 OT Individual Time: 1350-1422 OT Individual Time Calculation (min): 32 min    Short Term Goals: Week 2:  OT Short Term Goal 1 (Week 2): Continue progressing toward LTGs  Skilled Therapeutic Interventions/Progress Updates:  Pt received sitting in recliner for skilled OT session with focus on gentle RUE ROM and functional mobility. Pt agreeable to interventions, demonstrating overall pleasant mood. Pt with un-rated pain. OT offering intermediate rest breaks and positioning suggestions throughout session to address pain/fatigue and maximize participation/safety in session.   Pt ambulates from room<>ortho gym with supervision + LBQC. In ortho gym, pt participates in gentle RUE ROM exercises including: -Elbow flexion/extension -Forearm supination/pronation -Wrist flexion/extension -Wrist radial/ulnar deviation   Pt remained resting in recliner with all immediate needs met at end of session. Pt continues to be appropriate for skilled OT intervention to promote further functional independence.   Therapy Documentation Precautions:  Precautions Precautions: Fall, Cervical, Back Required Braces or Orthoses: Cervical Brace, Spinal Brace, Sling, Splint/Cast Cervical Brace: Hard collar, At all times Spinal Brace: Thoracolumbosacral orthotic (for comfort) Splint/Cast: R long arm Restrictions Weight Bearing Restrictions: Yes RUE Weight Bearing: Non weight bearing   Therapy/Group: Individual Therapy  Maudie Mercury, OTR/L, MSOT  05/01/2022, 6:20 AM

## 2022-05-01 NOTE — Progress Notes (Signed)
Inpatient Rehabilitation Care Coordinator Discharge Note   Patient Details  Name: ROJELIO VULTAGGIO MRN: [id number] Date of Birth: [birth year]   Discharge location: HOME WITH WIFE WHO IS TAKING A FMLA TO BE THERE WITH HIM  Length of Stay: 11 DAYS  Discharge activity level: SUPERVISION-MIN LEVEL ASSIST  Home/community participation: ACTIVE  Patient response [id number] Literacy - How often do you need to have someone help you when you read instructions, pamphlets, or other written material from your doctor or pharmacy?: Never  Patient response [id number] Isolation - How often do you feel lonely or isolated from those around you?: Never  Services provided included: MD, RD, PT, OT, RN, CM, Pharmacy, SW, TR  Financial Services:  Charity fundraiser Utilized: Private Insurance [employer]  Choices offered to/list presented to: PT AND WIFE  Follow-up services arranged:  Outpatient, DME, Patient/Family has no preference for HH/DME agencies    Outpatient Servicies: [facility] [place] [facility] TO SET UP APPOINTMENTS DME : [facility]    Patient response to transportation need: Is the patient able to respond to transportation needs?: Yes In the past 12 months, has lack of transportation kept you from medical appointments or from getting medications?: No In the past 12 months, has lack of transportation kept you from meetings, work, or from getting things needed for daily living?: No    Comments (or additional information): WIFE WAS [facility]. BOTH COMFORTABLE WITH PT'S CARE NEEDS.   Patient/Family verbalized understanding of follow-up arrangements:  Yes  Individual responsible for coordination of the follow-up plan: TERESA-WIFE  [phone]  Confirmed correct DME delivered: Elease Hashimoto 05/01/2022    Archimedes Harold, Gardiner Rhyme

## 2022-05-01 NOTE — Telephone Encounter (Signed)
Patient Advocate Encounter   Received notification that prior authorization for Lidocaine 5% patches is required.   PA submitted on 05/01/2022 Key [id number] Status is pending       Lyndel Safe, [place] Patient Advocate Specialist [facility] Patient Advocate Team Direct Number: [phone]  Fax: [phone]

## 2022-05-01 NOTE — Progress Notes (Signed)
Patient ID: Bradley [name], male   DOB: September 03, 1970, 52 y.o.   MRN: [id number] Patient and wife asking for clarification of pain medication for discharge. Reinforced previous conversation w MD; that PM+R MD will order prn medication for discharge through [facility] in anticipation for weaning long acting meds. Patient will need to follow up with Dr. Nelva Bush on pain medication regimen after discharge for script renewal. Wife stated an understanding of information reviewed. [name]

## 2022-05-01 NOTE — Progress Notes (Signed)
Physical Therapy Discharge Summary  Patient Details  Name: Bradley [name] MRN: [id number] Date of Birth: 1970-04-20  Date of Discharge from PT service:May 01, 2022  Patient has met 8 of 8 long term goals due to improved activity tolerance, improved balance, improved postural control, increased strength, increased range of motion, ability to compensate for deficits, improved awareness, and improved coordination. Patient to discharge at an ambulatory/WC level Supervision using [facility]. Patient's care partner is independent to provide the necessary physical assistance at discharge. Pt's wife has been present and attended family education training sessions and verbalized and demonstrated confidence with ability to ensure safe discharge home.   All goals met   Recommendation:  Patient will benefit from ongoing skilled PT services in outpatient setting to continue to advance safe functional mobility, address ongoing impairments in transfers, generalized strengthening and endurance, dynamic standing balance/coordination, gait training, and to minimize fall risk.  Equipment: LBQC, hospital bed, 18x18 transport WC  Reasons for discharge: treatment goals met  Patient/family agrees with progress made and goals achieved: Yes  PT Discharge Precautions/Restrictions Precautions Precautions: Fall;Cervical;Back Required Braces or Orthoses: Cervical Brace;Spinal Brace;Sling;Splint/Cast Cervical Brace: Hard collar;At all times Spinal Brace: Thoracolumbosacral orthotic (for comfort) Splint/Cast: R long arm Restrictions Weight Bearing Restrictions: Yes RUE Weight Bearing: Non weight bearing Pain Interference Pain Interference Pain Effect on Sleep: 4. Almost constantly Pain Interference with Therapy Activities: 1. Rarely or not at all Pain Interference with Day-to-Day Activities: 1. Rarely or not at all Cognition Overall Cognitive Status: Within Functional Limits for tasks  assessed Arousal/Alertness: Awake/alert Orientation Level: Oriented X4 Memory: Appears intact Awareness: Appears intact Problem Solving: Appears intact Safety/Judgment: Appears intact Sensation Sensation Light Touch: Impaired Detail Proprioception: Appears Intact Additional Comments: decreased sensation RLE>LLE and pt reports hx of sciatica on RLE Coordination Gross Motor Movements are Fluid and Coordinated: Yes Coordination and Movement Description: generalized weakness/deconditioning, pain Heel Shin Test: slow bilaterally Motor  Motor Motor: Within Functional Limits Motor - Skilled Clinical Observations: generalized weakness/deconditioning, pain  Mobility Bed Mobility Bed Mobility: Rolling Right;Rolling Left;Supine to Sit;Sit to Supine Rolling Right: Supervision/verbal cueing Rolling Left: Supervision/Verbal cueing Supine to Sit: Supervision/Verbal cueing Sit to Supine: Supervision/Verbal cueing Transfers Transfers: Sit to Stand;Stand to Sit;Stand Pivot Transfers Sit to Stand: Supervision/Verbal cueing Stand to Sit: Supervision/Verbal cueing Stand Pivot Transfers: Supervision/Verbal cueing Transfer (Assistive device): Large base quad cane Locomotion  Gait Ambulation: Yes Gait Assistance: Supervision/Verbal cueing Gait Distance (Feet): 150 Feet Assistive device: Large base quad cane Gait Gait: Yes Gait Pattern: Impaired Gait Pattern: Step-to pattern;Decreased stride length;Wide base of support;Step-through pattern Gait velocity: decreased Stairs / Additional Locomotion Stairs: Yes Stairs Assistance: Contact Guard/Touching assist Stair Management Technique: One rail Left Number of Stairs: 4 Height of Stairs: 6 Ramp: Contact Guard/touching assist [facility]) Wheelchair Mobility Wheelchair Mobility: Yes Wheelchair Assistance: Dependent - Patient 0% Wheelchair Parts Management: Needs assistance Distance: >178f  Trunk/Postural Assessment  Cervical Assessment Cervical  Assessment: Within Functional Limits Thoracic Assessment Thoracic Assessment: Within Functional Limits Lumbar Assessment Lumbar Assessment: Exceptions to [facility](posterior pelvic tilt) Postural Control Postural Control: Within Functional Limits  Balance Balance Balance Assessed: Yes Static Sitting Balance Static Sitting - Balance Support: Feet supported;No upper extremity supported Static Sitting - Level of Assistance: 6: Modified independent (Device/Increase time) Dynamic Sitting Balance Dynamic Sitting - Balance Support: Feet supported;No upper extremity supported Dynamic Sitting - Level of Assistance: 6: Modified independent (Device/Increase time) Static Standing Balance Static Standing - Balance Support: Left upper extremity supported ([facility] Static Standing - Level of Assistance:  5: Stand by assistance (supervision) Dynamic Standing Balance Dynamic Standing - Balance Support: Left upper extremity supported (LBQC) Dynamic Standing - Level of Assistance: 5: Stand by assistance (supervision) Dynamic Standing - Comments: transfers and gait Extremity Assessment  RLE Assessment RLE Assessment: Exceptions to [facility] General Strength Comments: tested in sitting. Grossly 4/5 - limited by pain LLE Assessment LLE Assessment: Exceptions to [facility] General Strength Comments: tested in sitting. Grossly 4+/5   [place], DPT  05/01/2022, 7:03 AM

## 2022-05-02 ENCOUNTER — Other Ambulatory Visit (HOSPITAL_COMMUNITY): Payer: Self-pay

## 2022-05-02 DIAGNOSIS — G894 Chronic pain syndrome: Secondary | ICD-10-CM | POA: Diagnosis not present

## 2022-05-02 DIAGNOSIS — T148XXA Other injury of unspecified body region, initial encounter: Secondary | ICD-10-CM

## 2022-05-02 DIAGNOSIS — G8911 Acute pain due to trauma: Secondary | ICD-10-CM | POA: Insufficient documentation

## 2022-05-02 DIAGNOSIS — T1490XA Injury, unspecified, initial encounter: Secondary | ICD-10-CM | POA: Diagnosis not present

## 2022-05-02 DIAGNOSIS — S2249XA Multiple fractures of ribs, unspecified side, initial encounter for closed fracture: Secondary | ICD-10-CM | POA: Insufficient documentation

## 2022-05-02 DIAGNOSIS — S42309A Unspecified fracture of shaft of humerus, unspecified arm, initial encounter for closed fracture: Secondary | ICD-10-CM | POA: Insufficient documentation

## 2022-05-02 DIAGNOSIS — S300XXA Contusion of lower back and pelvis, initial encounter: Secondary | ICD-10-CM | POA: Insufficient documentation

## 2022-05-02 DIAGNOSIS — K5900 Constipation, unspecified: Secondary | ICD-10-CM | POA: Insufficient documentation

## 2022-05-02 DIAGNOSIS — D62 Acute posthemorrhagic anemia: Secondary | ICD-10-CM | POA: Insufficient documentation

## 2022-05-02 DIAGNOSIS — I10 Essential (primary) hypertension: Secondary | ICD-10-CM | POA: Diagnosis not present

## 2022-05-02 DIAGNOSIS — S42491S Other displaced fracture of lower end of right humerus, sequela: Secondary | ICD-10-CM

## 2022-05-02 DIAGNOSIS — M79601 Pain in right arm: Secondary | ICD-10-CM | POA: Diagnosis not present

## 2022-05-02 NOTE — Progress Notes (Signed)
PROGRESS NOTE   Subjective/Complaints:  Pt to discharge home today. Pt has visit to his pain clinic this week, pharmacy would not fill his prior order. Seen by ortho this AM, sutures to stay in for anouther week.    ROS: +pain-multiple locations, mostly back/ribs-gradualy improving, +continued insomnia- improved +intermittent urinary retention-resolved. Swelling in Rt lower back-known hematoma. Denies fevers, chills,HA, CP, SOB, abd pain, N/V/D/C, new/worsening paresthesias/weakness, or any other complaints at this time.     Objective:   No results found. Recent Labs    05/01/22 0647  WBC 5.2  HGB 8.7*  HCT 27.1*  PLT 469*    Recent Labs    05/01/22 0647  NA 134*  K 3.9  CL 102  CO2 27  GLUCOSE 95  BUN 11  CREATININE 0.67  CALCIUM 8.3*     Intake/Output Summary (Last 24 hours) at 05/02/2022 0849 Last data filed at 05/01/2022 1844 Gross per 24 hour  Intake 720 ml  Output --  Net 720 ml         Physical Exam: Vital Signs Blood pressure 121/80, pulse 78, temperature 98.3 F (36.8 C), temperature source Oral, resp. rate 17, height '5\' 10"'$  (1.778 m), weight 111.5 kg, SpO2 100 %.  Constitutional:  NAD, Sitting in w/c at sink HENT: [place]/AT, MMM Eyes: EOMI,   PERRL Neck: Immobilized with C- collar Cardiovascular: RRR, holosystolic murmur heard best at LUSB, no rub/gallops, distal pulses intact Pulmonary: CTAB, no w/r/r, good air movement Abdominal: soft, nondistended, nonTTP, no r/g/r, +BS throughout  Extremities: RUE splint has been removed, resting on armchair, slight bruising noted to antecubital fossa, moderate bruising noted to axillary area. Incisions CDI Right lower back area of swelling -no redness or signs of infection (known hematoma in this area) Neurological:  moving all 4 extremities, Alert and oriented x3, CN 2-12 grossly intact, sensation intact to LT in all 4 extremities. No abnormal tone  noted    Assessment/Plan: 1. Functional deficits which require 3+ hours per day of interdisciplinary therapy in a comprehensive inpatient rehab setting. Physiatrist is providing close team supervision and 24 hour management of active medical problems listed below. Physiatrist and rehab team continue to assess barriers to discharge/monitor patient progress toward functional and medical goals  Care Tool:  Bathing    Body parts bathed by patient: Right arm, Left arm, Chest, Abdomen, Front perineal area, Buttocks, Right upper leg, Left upper leg, Face, Right lower leg, Left lower leg   Body parts bathed by helper: Right arm, Left arm, Front perineal area, Buttocks, Right upper leg, Left upper leg, Left lower leg, Right lower leg     Bathing assist Assist Level: Set up assist     Upper Body Dressing/Undressing Upper body dressing   What is the patient wearing?: Pull over shirt    Upper body assist Assist Level: Supervision/Verbal cueing    Lower Body Dressing/Undressing Lower body dressing      What is the patient wearing?: Underwear/pull up, Pants     Lower body assist Assist for lower body dressing: Supervision/Verbal cueing     Toileting Toileting    Toileting assist Assist for toileting: Supervision/Verbal cueing  Transfers Chair/bed transfer  Transfers assist     Chair/bed transfer assist level: Supervision/Verbal cueing     Locomotion Ambulation   Ambulation assist      Assist level: Supervision/Verbal cueing Assistive device: Cane-quad Max distance: 155f   Walk 10 feet activity   Assist  Walk 10 feet activity did not occur: Safety/medical concerns  Assist level: Supervision/Verbal cueing Assistive device: Cane-quad   Walk 50 feet activity   Assist Walk 50 feet with 2 turns activity did not occur: Safety/medical concerns  Assist level: Supervision/Verbal cueing Assistive device: Cane-quad    Walk 150 feet activity   Assist Walk  150 feet activity did not occur: Safety/medical concerns  Assist level: Supervision/Verbal cueing Assistive device: Cane-quad    Walk 10 feet on uneven surface  activity   Assist Walk 10 feet on uneven surfaces activity did not occur: Safety/medical concerns   Assist level: Contact Guard/Touching assist Assistive device: Cane-quad   Wheelchair     Assist Is the patient using a wheelchair?: Yes Type of Wheelchair: Manual    Wheelchair assist level: Dependent - Patient 0% Max wheelchair distance: >1541f   Wheelchair 50 feet with 2 turns activity    Assist        Assist Level: Dependent - Patient 0%   Wheelchair 150 feet activity     Assist      Assist Level: Dependent - Patient 0%   Blood pressure 121/80, pulse 78, temperature 98.3 F (36.8 C), temperature source Oral, resp. rate 17, height '5\' 10"'$  (1.778 m), weight 111.5 kg, SpO2 100 %.  Medical Problem List and Plan: 1. Functional deficits secondary to polytrauma after falling through a ceiling             -patient may shower if RUE is covered and pt can tolerate             -ELOS/Goals: 2/27, sup, min A ADLs  -Continue CIR  -DC home today  2.  Antithrombotics: -DVT/anticoagulation:  Pharmaceutical: Lovenox '30mg'$  q12h             -antiplatelet therapy: N/A 3. Chronic pain/Pain Management: Tylenol 650 mg qid + '325mg'$  q6h PRN, Lidoderm 2patches QD - continue Oxycontin 12hr 30 mg BID             --using oxycodone 15 mg TID (was on oxycodone 10 mg 5x/day)  -pt told me he was wanting to wean off narcotics prior to this admission -04/22/22 pt with increasing pain d/t PRN dosing; agreeable to change Oxycodone IR to '10mg'$  q4h scheduled with '5mg'$  q4h PRN-- this will actually end up giving him slightly less on a schedule than what he's been getting with the PRNs, but still leave a PRN available if he needs it; monitor if this works better, may want to consider alternative pain control method given his tolerance to  oxycodone (?nucynta or other newer opioid med)-- defer to weekday team -04/23/22 much better control of pain with scheduled dosing-- not using much PRN; monitor -04/24/22 Tramadol '50mg'$  PRN was started today for mild pain -2/20 Decreased skelaxin dose, stop robaxin and start baclofen '5mg'$  TID, Kpad, discussed plan to wean oxycodone -2/21 Seen by neuropsych and his coping issues regarding chronic pain was discussed -2/21 pain improved, continue current medications -2/22 stop skelaxin  -2/23 decrease oxycontin to '20mg'$  q12h -04/29/22 changed PRN comments to say oxy first, tramadol second, advised speaking with nursing staff as well regarding which he's being given. Defer d/c planning with regards to  pain meds to weekday team -04/30/22 still concerned with pain regimen ('20mg'$  dosing of Oxy XR)-- needs to discuss with weekday team 2/26 medication changed to MS contin 12hr '30mg'$  in preperation for discharge, further medications per his pain clinic. His pain clinic was called- he has active order for his short acting oxycodone available for pickup. He also should have about 6 days of oxycodone left from his prior prescription. 2/27 MS contin ordered, pt to f/u with his pain doctor this week --neuropathic pain: Gabapentin up to 900 mg TID now. Cymbalta 20 mg bid --also on Skelaxin '800mg'$  QID and robaxin '1000mg'$  TID-->back spasms controlled on this--d/c'd as above  4. Mood/Behavior/Sleep: LCSW to follow for evaluation and support.              --trazodone prn for insomnia -04/22/22 changed trazodone to scheduled '25mg'$  QHS+'25mg'$  PRN and added scheduled Melatonin '5mg'$  QHS -2/20 increase trazodone dose to '50mg'$  scheduled -04/29/22 Sleep improved; Continue trazodone current dose             -antipsychotic agents: N/A 5. Neuropsych/cognition: This patient is capable of making decisions on his own behalf. 6. Skin/Wound Care: Routine pressure relief measures.  7. Fluids/Electrolytes/Nutrition: Monitor I/O. Check weekly labs  next 05/01/22 8. C7 compression Fx: C collar at all times per Dr. Annette Stable.  9. Comminuted distal humerus fracture/olecranon Fx/?radial neck Fx:  S/p ORIF humerus 04/17/22-->NWB RUE  -2/22 Orthopedics recommending continuing nonweightbearing right upper extremity, okay for range of motion shoulder elbow wrist and hand, less than 1 pound weight restriction -Splint removed 04/27/22  F/u with surgery as outpatient, suture to stay in for 1 more week 10. HTN/ Syncope: Low dose Avapro 37.'5mg'$  QD resumed on 02/16--will set hold parameters. --Monitor orthostatic BP. Add thigh high TEDs and abdominal binder.  -04/22/22 orthostatics+ during PT eval this morning but Avapro had been given, and TEDs not in place; advised use of TEDs and reminded nursing staff about the Avapro order parameters; monitor -2/21 stop avapro -2/27 well controlled Vitals:   04/28/22 1347 04/28/22 1938 04/29/22 0322 04/29/22 1345  BP: 114/72 123/77 114/65 98/60   04/29/22 1929 04/30/22 0315 04/30/22 1343 04/30/22 1923  BP: 112/71 101/62 95/61 111/69   05/01/22 0346 05/01/22 1514 05/01/22 2014 05/02/22 0324  BP: 107/69 121/76 123/79 121/80    11. T12-L5 right TP Fx:  TLSO prn 12. H/o gastric bypass: Resume Vitamin B12 IM monthly and vitamins. Intake at baseline.  13. Abdominal pain/Acute on constipation:  -KUB ordered and demonstrates moderate gas in bowels, ?ileus --Took MOM at daily at night-->"nothing else works"--will resume --pt received mag citrate at 0930 and SMOG enema at 1130-->large results at 1300 today --continue Miralax 17g TID and colace '100mg'$  bid, dulcolax supp PRN, fleet's PRN             -SS enema in AM if no further bm's.  -04/22/22 LBM yesterday x3, abd pain improved, no n/v reported; monitor on current regimen -04/23/22 no BM since 2/16 but refused miralax a few times-- feels he might have one today, advised not missing meds today and also ordered sorbitol 67m PRN in case he needs it-- discussed he will likely need  enema if he doesn't have a BM by tomorrow -2/19 order sorbitol 358m-04/29/22 LBM this morning, cont regimen -04/30/22 has daily BMs, has been declining TID miralax, will d/c this med 14. Urinary retention: Flomax increased to 0.8 mg today-->change to HS as continues to almost black out at EOB per pt/wife.              --  monitor voiding with bladder scans/PVR checks.  -04/22/22 PVRs not being documented, required ISC last night, urinating better this morning; monitor -2/27 remains continent- denies difficulty voiding 15. Soft tissue hematoma/ABLA: H/H with downward trend. Recheck CBC in am             --transfuse prn Hgb<7.0 or if symptomatic.  -04/22/22 Hgb 7.8, remaining stable (9.4>8.7>7.8>7.7>7.4>7.8), continue to monitor -2/26 HGB stable at 8.7- f/u with PCP to monitor hematoma 16. Hyponatremia: 133-135 prior to admission. Monitor on weekly labs  -2/19 sodium appears to be stable at 133  -2/22 start salt tabs, monitor with labs next 05/01/22  Continue salt tabs, NA up to 134  -Recheck with PCP 17.  Orthostatic hypotension.  Patient reports he is drinking fluids.  BP appeared to be more stable with Ace wrap's- continue this.  -Consider additional medication if interferes with therapy  -stop avepro  -2/22 start salt tabs -2/23 denies orthostatic symptoms, monitor response to medication change -04/29/22 seems to be improving, monitor  Continue salt tabs  LOS: 11 days A FACE TO FACE EVALUATION WAS PERFORMED  Jennye Boroughs 05/02/2022, 8:49 AM

## 2022-05-02 NOTE — Progress Notes (Signed)
   ORTHOPAEDIC PROGRESS NOTE  s/p Procedure(s): OPEN REDUCTION INTERNAL FIXATION (ORIF) RIGHT DISTAL HUMERUS FRACTURE on 02/15/23 with Dr. Griffin Basil  SUBJECTIVE: Patient expected to be discharged today. Pain is tolerable.  OBJECTIVE: PE: General: sitting up in recliner, NAD RUE: Incision is CDI. Nylon sutures are in place. + Motor in AIN, PIN, Ulnar distributions. Sensation intact in medial, radial, and ulnar distributions. Well perfused digits.     Vitals:   05/01/22 2014 05/02/22 0324  BP: 123/79 121/80  Pulse: 86 78  Resp: 18 17  Temp: 98.3 F (36.8 C) 98.3 F (36.8 C)  SpO2: 100%    Stable post-op images.   ASSESSMENT: Bradley [name] is a 52 y.o. male POD#15  PLAN: Weightbearing: NWB RUE - okay to work on ROM shoulder, elbow, wrist, hand - < 1 pound weight restriction Insicional and dressing care: Keep incisions clean and dry. Okay to leave open to air. Will plan to leave sutures in for another week. Will have patient follow-up in the office next week for suture removal Orthopedic device(s): c-collar VTE prophylaxis: per primary Pain control: Per primary team - Patient previous in pain management. We will not be prescribing narcotics chronically.  Follow - up plan: 1 week in office for incision check, xray, suture removal Dispo: Currently in CIR. Plan to discharge home today.   Contact information:  Weekdays 8-5 Dr. Ophelia Charter, Noemi Chapel PA-C, After hours and holidays please check [email or web address] for group call information for [facility]  Noemi Chapel, PA-C 05/02/2022

## 2022-05-02 NOTE — Discharge Summary (Signed)
Physician Discharge Summary  Patient ID: [name] MRN: [id number] DOB/AGE: Mar 19, 1970 52 y.o.  Admit date: 04/21/2022 Discharge date: 05/02/2022  Discharge Diagnoses:  Principal Problem:   Trauma Active Problems:   BACK PAIN, LUMBAR   Vitamin D deficiency   Benign prostatic hyperplasia without lower urinary tract symptoms   Uncomplicated opioid dependence (HCC)   Chronic pain syndrome   Acute blood loss anemia   Lumbar muscle hematoma   Multiple rib fractures   Humerus shaft fracture--right   Constipation   Acute pain due to trauma   Discharged Condition: stable  Significant Diagnostic Studies: DG Elbow 2 Views Right  Result Date: 04/27/2022 CLINICAL DATA:  Postoperative check. EXAM: RIGHT ELBOW - 2 VIEW COMPARISON:  Right elbow radiographs 04/17/2022 FINDINGS: There is diffuse decreased bone mineralization. Redemonstration of both medial and lateral plate and screw fixation of the distal humerus and screw fixation of the proximal ulna. Unchanged near anatomic alignment. Small joint effusion is similar to prior. Interval removal overlying cast material. No hardware complication is seen. IMPRESSION: Status post ORIF of the distal humerus and proximal ulna without evidence of hardware complication. Electronically Signed   By: Yvonne Kendall M.D.   On: 04/27/2022 13:28   DG Abd 1 View  Result Date: 04/21/2022 CLINICAL DATA:  Abdominal pain. EXAM: ABDOMEN - 1 VIEW COMPARISON:  04/14/2022 FINDINGS: Moderate amount of bowel gas throughout the abdomen and pelvis. Evidence for colonic gas. Surgical hardware in the lumbar spine. Small linear and bandlike densities in the lower lungs are suggestive for atelectasis and/or scarring. Limited evaluation for free air on the supine images. Cholecystectomy clips. IMPRESSION: Moderate amount of bowel gas throughout the abdomen and pelvis. Findings are nonspecific but could be associated with an ileus pattern. Electronically Signed   By: Markus Daft  M.D.   On: 04/21/2022 15:04    Labs:  Basic Metabolic Panel:    Latest Ref Rng & Units 05/01/2022    6:47 AM 04/24/2022    6:51 AM 04/21/2022    2:48 PM  BMP  Glucose 70 - 99 mg/dL 95  125  127   BUN 6 - 20 mg/dL '11  16  21   '$ Creatinine 0.61 - 1.24 mg/dL 0.67  0.91  0.91   Sodium 135 - 145 mmol/L 134  133  134   Potassium 3.5 - 5.1 mmol/L 3.9  3.9  4.5   Chloride 98 - 111 mmol/L 102  97  98   CO2 22 - 32 mmol/L '27  23  27   '$ Calcium 8.9 - 10.3 mg/dL 8.3  8.7  8.1      CBC:    Latest Ref Rng & Units 05/01/2022    6:47 AM 04/24/2022    6:51 AM 04/22/2022    6:41 AM  CBC  WBC 4.0 - 10.5 K/uL 5.2  5.1  5.8   Hemoglobin 13.0 - 17.0 g/dL 8.7  8.7  7.8   Hematocrit 39.0 - 52.0 % 27.1  25.8  23.3   Platelets 150 - 400 K/uL 469  344  244      CBG: No results for input(s): "GLUCAP" in the last 168 hours.  Brief HPI:   Bradley [name] is a 52 y.o. 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  He was found to have right rib fractures 2-12 with small R-PTX, right comminuted scapular fracture, right  humerus fracture, radial neck fracture, low back hematoma, right T12-L1 right transverse process fractures and acute blood loss anemia.  He was placed in long-arm splint and made NWB.  MRI C-spine showed mild compression fracture of C7, T3, T4 and T5.  C-collar placed with recommendations to wear at all times and a TLSO was ordered as needed for comfort.  Dr. Darcus Austin consulted and patient underwent ORIF right distal humerus, open reduction of humeral shaft with internal fixation and right ulnar nerve neurolysis with anterior transposition on 02/12.  Postop to be NWB and dressings to be left intact until follow-up.  Abnormal LFTs were resolving and acute blood loss anemia was being monitored.  He has had issues with urinary retention requiring INO caths and Flomax was increased to 0.8 mg.   He has also had issues with constipation requiring multiple laxatives.  Therapy was working with patient who has been limited by orthostatic changes with dizziness as well as syncope and unresponsive episode on 02/13.  Pain controlled has been poor requiring addition of OxyContin as well as multiple muscle relaxers and up titration of gabapentin.  Therapy was working with patient who was limited by weakness, pain, orthostatic symptoms including syncope/presyncope.  CIR was recommended due to functional decline.   Hospital Course: JAMER SCHOONOVER was admitted to rehab 04/21/2022 for inpatient therapies to consist of PT down.  Moved down some mood down some low iron levels PCOS elevated mood and OT at least three hours five days a week. Past admission physiatrist, therapy team and rehab RN have worked together to provide customized collaborative inpatient rehab.  Subcu Lovenox was used for DVT prophylaxis.  His blood pressures were monitored on TID basis and tired teds and abdominal binder was ordered for BP support.  His activity tolerance improved, his orthostatic symptoms have resolved.  KUB was ordered at admission demonstrating moderate gas and bowels and bowel program has been titrated with addition of MOM in addition to MiraLAX and Colace.  He reported hesitancy however voiding function is improved overall.  Follow-up CBC shows acute blood loss anemia to be slowly resolving.  He has reported significant back spasms despite use of Skelaxin and Robaxin.  Baclofen was added and titrated upwards as Robaxin and Skelaxin weaned off.  He continues on C-collar and has been advised to follow up with NS for input after discharge.  Hematoma low back is stable.  He did continue to report poor pain control despite OxyContin 30 twice daily on board therefore Oxy IR 10 mg as scheduled 5 times a day in addition to 5 mg every 4 hours as needed for breakthrough pain.  Tramadol was also added to be used 4 times daily on as  needed basis.  He has been educated on importance of need for taper with time and OxyContin was decreased to 20 mg twice daily and as needed Oxy IR weaned off.  Due to insurance issues OxyContin was changed to MS Contin 30 mg twice daily and patient advised that this would need to be further tapered after discharge.   Lidocaine patches have been used to back to help manage right shoulder and rib pain.  He continues to report lower back and sharp shooting pains down his legs.  Due to multiple drug adjustments, his Cymbalta was kept on home dose and recommend neurosurgery evaluate for further titration to help improve neuropathic pain.  Ortho has been following for input on the right humerus fracture.  RUE splint was removed on 02/22  by ortho with recommendations to follow up in office in a week for suture removal.  Follow-up x-rays show hardware to be intact.  His RUE incisions are C/D/I with sutures in place. OT has been working on ROM and he has 30 degrees ROM at right elbow, 70 degrees flexion at rest, full finger and wrist AROM  and 40 degrees shoulder AROM.  He drawbridge: Neuro rehab after discharge has made steady gains during his rehab stay and requires supervision at discharge.  He will continue to receive outpatient PT and OT at [facility] outpatient rehab at [facility].   Rehab course: During patient's stay in rehab weekly team conferences were held to monitor patient's progress, set goals and discuss barriers to discharge. At admission, patient required min to mod assist with mobility and max assist with basic self-care task. He  has had improvement in activity tolerance, balance, postural control as well as ability to compensate for deficits. He has had improvement in use of RUE and requires supervision with ADL tasks.  He requires supervision with verbal cues for transfers and to ambulate 150 feet with large-base quad cane.  He is able to climb 4 stairs with contact-guard assist.  Wife has been present  during his stay to provide ego support and has been present for for multiple therapy sessions.  Family education has been completed.  Discharge disposition: 01-Home or Self Care  Diet: Regular  Special Instructions: No weight on RUE.  Keep incisions clean and dry. Wear cervical collar at all times already.  By NS. No driving or strenuous activity. Follow-up with neurosurgery for input on adjustment of Cymbalta dose.  Allergies as of 05/02/2022       Reactions   Codeine Hives, Itching   Nsaids Other (See Comments)   H/o gastric bypass - can cause ulcers        Medication List     STOP taking these medications    fluconazole 150 MG tablet Commonly known as: DIFLUCAN   gabapentin 300 MG capsule Commonly known as: NEURONTIN Replaced by: gabapentin 600 MG tablet   indapamide 1.25 MG tablet Commonly known as: LOZOL   irbesartan 300 MG tablet Commonly known as: AVAPRO   meloxicam 15 MG tablet Commonly known as: MOBIC   nystatin 100000 UNIT/ML suspension Commonly known as: MYCOSTATIN   phentermine 37.5 MG capsule   sildenafil 20 MG tablet Commonly known as: REVATIO   Zepbound 2.5 MG/0.5ML Pen Generic drug: tirzepatide       TAKE these medications    acetaminophen 325 MG tablet Commonly known as: TYLENOL Take 2 tablets (650 mg total) by mouth 4 (four) times daily.   Baclofen 5 MG Tabs Take 1 tablet (5 mg total) by mouth 3 (three) times daily.   CertaVite/Antioxidants Tabs Take 1 tablet by mouth daily.   docusate sodium 100 MG capsule Commonly known as: COLACE Take 1 capsule (100 mg total) by mouth 2 (two) times daily.   Dodex 1000 MCG/ML injection Generic drug: cyanocobalamin ADMINISTER 1 ML(1000 MCG) IN THE MUSCLE EVERY 30 DAYS What changed: See the new instructions.   DULoxetine 20 MG capsule Commonly known as: CYMBALTA Take 20 mg by mouth 2 (two) times daily.   folic acid 1 MG tablet Commonly known as: FOLVITE TAKE 1 TABLET(1 MG) BY MOUTH  DAILY What changed:  how much to take how to take this when to take this   Fusion 65-65-25-30 MG Caps Take 1 capsule by mouth 2 (two) times daily.   gabapentin  600 MG tablet Commonly known as: Neurontin Take 1.5 tablets (900 mg total) by mouth 3 (three) times daily. Replaces: gabapentin 300 MG capsule   INS SYRINGE/NEEDLE 1CC/28G 28G X 1/2" 1 ML Misc Use to inject b12 monthly   Insulin Pen Needle 32G X 6 MM Misc 1 Act by Does not apply route daily.   lidocaine 5 % Commonly known as: LIDODERM Place 2 patches onto the skin daily. Remove & Discard patch within 12 hours or as directed by MD Notes to patient: Your copay would have been $100/- will be cheaper to by it over the counter.    magnesium hydroxide 400 MG/5ML suspension Commonly known as: MILK OF MAGNESIA Take 30 mLs by mouth daily.   melatonin 5 MG Tabs Take 1 tablet (5 mg total) by mouth at bedtime.   morphine 30 MG 12 hr tablet--Rx# 14 pills Commonly known as: MS CONTIN Take 1 tablet (30 mg total) by mouth every 12 (twelve) hours.   Oxycodone HCl 10 MG Tabs Take 10 mg by mouth 5 (five) times daily as needed (pain.).   sodium chloride 1 g tablet Take 1 tablet (1 g total) by mouth 2 (two) times daily with a meal.   tamsulosin 0.4 MG Caps capsule Commonly known as: FLOMAX Take 2 capsules (0.8 mg total) by mouth daily after supper.   traMADol 50 MG tablet Commonly known as: ULTRAM Take 1 tablet (50 mg total) by mouth every 6 (six) hours as needed for moderate pain (give SECOND after Oxycodone).   traZODone 50 MG tablet Commonly known as: DESYREL Take 1 tablet (50 mg total) by mouth at bedtime.   zinc gluconate 50 MG tablet TAKE 1 TABLET(50 MG) BY MOUTH DAILY        Follow-up Information     Jennye Boroughs, MD. Call.   Specialty: Physical Medicine and Rehabilitation Why: As needed Contact information: [address] [place] [address] [phone]         Suella Broad, MD Follow up on 05/03/2022.   Specialty: Physical Medicine and Rehabilitation Why: be there at 10:15 am for 10:30 am for refills on pain meds Contact information: [address] [place] [address] [phone]         [name], MD .   Specialty: Internal Medicine Why: Call in 1-2 days for post hospital follow up Contact information: [place] [address] [phone]         Varkey, Dax T, MD Follow up.   Specialty: Orthopedic Surgery Why: Call in 1-2 days for post hospital follow up Contact information: [address] [address] [phone]         Eustace Moore, MD Follow up.   Specialty: Neurosurgery Why: Call in 1-2 days for post hospital follow up Contact information: [address] [place] [address] [phone]                 Signed: Bary Leriche 05/02/2022, 6:30 PM

## 2022-05-03 DIAGNOSIS — T402X5A Adverse effect of other opioids, initial encounter: Secondary | ICD-10-CM | POA: Diagnosis not present

## 2022-05-03 DIAGNOSIS — G894 Chronic pain syndrome: Secondary | ICD-10-CM | POA: Diagnosis not present

## 2022-05-03 DIAGNOSIS — M5416 Radiculopathy, lumbar region: Secondary | ICD-10-CM | POA: Diagnosis not present

## 2022-05-03 DIAGNOSIS — M961 Postlaminectomy syndrome, not elsewhere classified: Secondary | ICD-10-CM | POA: Diagnosis not present

## 2022-05-09 DIAGNOSIS — S42351D Displaced comminuted fracture of shaft of humerus, right arm, subsequent encounter for fracture with routine healing: Secondary | ICD-10-CM | POA: Diagnosis not present

## 2022-05-10 DIAGNOSIS — S12601A Unspecified nondisplaced fracture of seventh cervical vertebra, initial encounter for closed fracture: Secondary | ICD-10-CM | POA: Diagnosis not present

## 2022-05-10 DIAGNOSIS — M5416 Radiculopathy, lumbar region: Secondary | ICD-10-CM | POA: Diagnosis not present

## 2022-05-11 ENCOUNTER — Encounter: Payer: Self-pay | Admitting: Internal Medicine

## 2022-05-11 ENCOUNTER — Ambulatory Visit (INDEPENDENT_AMBULATORY_CARE_PROVIDER_SITE_OTHER): Payer: Medicare Other | Admitting: Internal Medicine

## 2022-05-11 VITALS — BP 128/88 | HR 71 | Temp 98.2°F | Ht 70.0 in | Wt 257.8 lb

## 2022-05-11 DIAGNOSIS — Z9884 Bariatric surgery status: Secondary | ICD-10-CM | POA: Diagnosis not present

## 2022-05-11 DIAGNOSIS — M79662 Pain in left lower leg: Secondary | ICD-10-CM | POA: Insufficient documentation

## 2022-05-11 DIAGNOSIS — D508 Other iron deficiency anemias: Secondary | ICD-10-CM

## 2022-05-11 DIAGNOSIS — Z6837 Body mass index (BMI) 37.0-37.9, adult: Secondary | ICD-10-CM

## 2022-05-11 DIAGNOSIS — I82409 Acute embolism and thrombosis of unspecified deep veins of unspecified lower extremity: Secondary | ICD-10-CM | POA: Insufficient documentation

## 2022-05-11 DIAGNOSIS — I82492 Acute embolism and thrombosis of other specified deep vein of left lower extremity: Secondary | ICD-10-CM

## 2022-05-11 DIAGNOSIS — M7989 Other specified soft tissue disorders: Secondary | ICD-10-CM | POA: Diagnosis not present

## 2022-05-11 LAB — CBC WITH DIFFERENTIAL/PLATELET
Basophils Absolute: 0 10*3/uL (ref 0.0–0.1)
Basophils Relative: 0.3 % (ref 0.0–3.0)
Eosinophils Absolute: 0.1 10*3/uL (ref 0.0–0.7)
Eosinophils Relative: 2.5 % (ref 0.0–5.0)
HCT: 30.3 % — ABNORMAL LOW (ref 39.0–52.0)
Hemoglobin: 9.8 g/dL — ABNORMAL LOW (ref 13.0–17.0)
Lymphocytes Relative: 32 % (ref 12.0–46.0)
Lymphs Abs: 1.7 10*3/uL (ref 0.7–4.0)
MCHC: 32.3 g/dL (ref 30.0–36.0)
MCV: 84.8 fl (ref 78.0–100.0)
Monocytes Absolute: 0.6 10*3/uL (ref 0.1–1.0)
Monocytes Relative: 12.1 % — ABNORMAL HIGH (ref 3.0–12.0)
Neutro Abs: 2.8 10*3/uL (ref 1.4–7.7)
Neutrophils Relative %: 53.1 % (ref 43.0–77.0)
Platelets: 287 10*3/uL (ref 150.0–400.0)
RBC: 3.57 Mil/uL — ABNORMAL LOW (ref 4.22–5.81)
RDW: 15.8 % — ABNORMAL HIGH (ref 11.5–15.5)
WBC: 5.3 10*3/uL (ref 4.0–10.5)

## 2022-05-11 LAB — IBC + FERRITIN
Ferritin: 43.4 ng/mL (ref 22.0–322.0)
Iron: 27 ug/dL — ABNORMAL LOW (ref 42–165)
Saturation Ratios: 7.7 % — ABNORMAL LOW (ref 20.0–50.0)
TIBC: 348.6 ug/dL (ref 250.0–450.0)
Transferrin: 249 mg/dL (ref 212.0–360.0)

## 2022-05-11 LAB — D-DIMER, QUANTITATIVE: D-Dimer, Quant: 2.81 mcg/mL FEU — ABNORMAL HIGH (ref <0.50)

## 2022-05-11 MED ORDER — RIVAROXABAN (XARELTO) VTE STARTER PACK (15 & 20 MG): ORAL_TABLET | ORAL | 0 refills | Status: DC

## 2022-05-11 MED ORDER — ACCRUFER 30 MG PO CAPS: 1.0000 | ORAL_CAPSULE | Freq: Two times a day (BID) | ORAL | 1 refills | Status: DC

## 2022-05-11 NOTE — Progress Notes (Signed)
Subjective:  Patient ID: Bradley [name], male    DOB: 13-Aug-1970  Age: 52 y.o. MRN: [id number]  CC: Anemia   HPI Bradley [name] presents for f/up -----  He is status post admission for trauma.  He complains of a 1 week history of pain and swelling in his left lower extremity.  Outpatient Medications Prior to Visit  Medication Sig Dispense Refill   acetaminophen (TYLENOL) 325 MG tablet Take 2 tablets (650 mg total) by mouth 4 (four) times daily.     Baclofen 5 MG TABS Take 1 tablet (5 mg total) by mouth 3 (three) times daily. 90 tablet 0   docusate sodium (COLACE) 100 MG capsule Take 1 capsule (100 mg total) by mouth 2 (two) times daily. 60 capsule 0   DODEX 1000 MCG/ML injection ADMINISTER 1 ML(1000 MCG) IN THE MUSCLE EVERY 30 DAYS (Patient taking differently: Inject 1,000 mcg into the muscle every 30 (thirty) days.) 3 mL 0   DULoxetine (CYMBALTA) 20 MG capsule Take 20 mg by mouth 2 (two) times daily.     folic acid (FOLVITE) 1 MG tablet TAKE 1 TABLET(1 MG) BY MOUTH DAILY (Patient taking differently: Take 1 mg by mouth daily. TAKE 1 TABLET(1 MG) BY MOUTH DAILY) 90 tablet 1   gabapentin (NEURONTIN) 600 MG tablet Take 1.5 tablets (900 mg total) by mouth 3 (three) times daily. 135 tablet 0   INS SYRINGE/NEEDLE 1CC/28G 28G X 1/2" 1 ML MISC Use to inject b12 monthly 10 each 1   Insulin Pen Needle 32G X 6 MM MISC 1 Act by Does not apply route daily. 100 each 1   lidocaine (LIDODERM) 5 % Place 2 patches onto the skin daily. Remove & Discard patch within 12 hours or as directed by MD 60 patch 0   magnesium hydroxide (MILK OF MAGNESIA) 400 MG/5ML suspension Take 30 mLs by mouth daily. 355 mL 0   melatonin 5 MG TABS Take 1 tablet (5 mg total) by mouth at bedtime. 30 tablet 0   morphine (MS CONTIN) 30 MG 12 hr tablet Take 1 tablet (30 mg total) by mouth every 12 (twelve) hours. 14 tablet 0   Multiple Vitamin (MULTIVITAMIN WITH MINERALS) TABS tablet Take 1 tablet by mouth daily. 30 tablet 0    Oxycodone HCl 10 MG TABS Take 10 mg by mouth 5 (five) times daily as needed (pain.).     sodium chloride 1 g tablet Take 1 tablet (1 g total) by mouth 2 (two) times daily with a meal. 60 tablet 0   tamsulosin (FLOMAX) 0.4 MG CAPS capsule Take 2 capsules (0.8 mg total) by mouth daily after supper. 60 capsule 0   traMADol (ULTRAM) 50 MG tablet Take 1 tablet (50 mg total) by mouth every 6 (six) hours as needed for moderate pain (give SECOND after Oxycodone). 28 tablet 0   traZODone (DESYREL) 50 MG tablet Take 1 tablet (50 mg total) by mouth at bedtime. 30 tablet 0   zinc gluconate 50 MG tablet TAKE 1 TABLET(50 MG) BY MOUTH DAILY 90 tablet 1   Fe Fum-Fe Poly-Vit C-Lactobac (FUSION) 65-65-25-30 MG CAPS Take 1 capsule by mouth 2 (two) times daily. 180 capsule 1   No facility-administered medications prior to visit.    ROS Review of Systems  Constitutional:  Positive for unexpected weight change (wt gain). Negative for chills, diaphoresis, fatigue and fever.  HENT: Negative.    Eyes: Negative.   Respiratory:  Negative for cough, chest tightness, shortness of breath  and wheezing.   Cardiovascular:  Positive for chest pain. Negative for palpitations and leg swelling.  Gastrointestinal:  Negative for abdominal pain.  Endocrine: Negative.   Genitourinary:  Negative for difficulty urinating.  Musculoskeletal:  Positive for arthralgias and back pain. Negative for myalgias.  Neurological:  Positive for dizziness. Negative for weakness and light-headedness.  Hematological:  Negative for adenopathy. Does not bruise/bleed easily.  Psychiatric/Behavioral: Negative.      Objective:  BP 128/88 (BP Location: Left Arm, Patient Position: Sitting, Cuff Size: Normal)   Pulse 71   Temp 98.2 F (36.8 C) (Oral)   Ht 5\' 10"  (1.778 m)   Wt 257 lb 12.8 oz (116.9 kg)   SpO2 95%   BMI 36.99 kg/m   BP Readings from Last 3 Encounters:  05/11/22 128/88  05/02/22 121/80  04/21/22 138/67    Wt Readings from  Last 3 Encounters:  05/11/22 257 lb 12.8 oz (116.9 kg)  04/24/22 245 lb 13 oz (111.5 kg)  04/14/22 250 lb (113.4 kg)    Physical Exam Constitutional:      General: He is not in acute distress.    Appearance: He is ill-appearing. He is not toxic-appearing or diaphoretic.  HENT:     Mouth/Throat:     Mouth: Mucous membranes are moist.  Eyes:     General: No scleral icterus.    Conjunctiva/sclera: Conjunctivae normal.  Cardiovascular:     Rate and Rhythm: Normal rate and regular rhythm.     Pulses:          Dorsalis pedis pulses are 1+ on the right side and 1+ on the left side.       Posterior tibial pulses are 1+ on the right side and 1+ on the left side.     Heart sounds: Murmur heard.     Systolic murmur is present with a grade of 1/6.     No friction rub. No gallop.  Pulmonary:     Breath sounds: No stridor. No wheezing, rhonchi or rales.  Abdominal:     General: Abdomen is flat.     Palpations: There is no mass.     Tenderness: There is no abdominal tenderness. There is no guarding.     Hernia: No hernia is present.  Musculoskeletal:        General: No swelling.     Right lower leg: No edema.     Left lower leg: 1+ Edema present.  Skin:    General: Skin is warm.     Coloration: Skin is pale.  Neurological:     General: No focal deficit present.     Mental Status: He is alert.  Psychiatric:        Mood and Affect: Mood normal.     Lab Results  Component Value Date   WBC 5.3 05/11/2022   HGB 9.8 (L) 05/11/2022   HCT 30.3 (L) 05/11/2022   PLT 287.0 05/11/2022   GLUCOSE 95 05/01/2022   CHOL 149 12/13/2021   TRIG 103.0 12/13/2021   HDL 55.50 12/13/2021   LDLCALC 73 12/13/2021   ALT 37 04/24/2022   AST 39 04/24/2022   NA 134 (L) 05/01/2022   K 3.9 05/01/2022   CL 102 05/01/2022   CREATININE 0.67 05/01/2022   BUN 11 05/01/2022   CO2 27 05/01/2022   TSH 2.56 06/15/2021   PSA 0.63 12/13/2021   INR 1.1 04/14/2022   HGBA1C 5.6 06/15/2021    DG Abd 1  View  Result Date:  04/21/2022 CLINICAL DATA:  Abdominal pain. EXAM: ABDOMEN - 1 VIEW COMPARISON:  04/14/2022 FINDINGS: Moderate amount of bowel gas throughout the abdomen and pelvis. Evidence for colonic gas. Surgical hardware in the lumbar spine. Small linear and bandlike densities in the lower lungs are suggestive for atelectasis and/or scarring. Limited evaluation for free air on the supine images. Cholecystectomy clips. IMPRESSION: Moderate amount of bowel gas throughout the abdomen and pelvis. Findings are nonspecific but could be associated with an ileus pattern. Electronically Signed   By: Markus Daft M.D.   On: 04/21/2022 15:04   DG Wrist Complete Right  Result Date: 04/21/2022 CLINICAL DATA:  Right wrist pain after a 12 foot fall 04/14/2022. Initial encounter. EXAM: RIGHT WRIST - COMPLETE 3+ VIEW COMPARISON:  None Available. FINDINGS: The patient is in a plaster splint. No acute bony or joint abnormality is identified. Mild to moderate first CMC osteoarthritis noted. IMPRESSION: No acute abnormality. Electronically Signed   By: Inge Rise M.D.   On: 04/21/2022 09:18   VAS [place] LOWER EXTREMITY VENOUS (DVT)  Result Date: 05/12/2022  Lower Venous DVT Study Patient Name:  Bradley [name]  Date of Exam:   05/12/2022 Medical Rec #: [id number]        Accession #:    [id number] Date of Birth: 02/03/1971        Patient Gender: M Patient Age:   78 years Exam Location:  [facility] Procedure:      VAS [place] LOWER EXTREMITY VENOUS (DVT) Referring Phys: [name] --------------------------------------------------------------------------------  Indications: Pain, and Swelling.  Performing Technologist: Archie Patten RVS  Examination Guidelines: A complete evaluation includes B-mode imaging, spectral Doppler, color Doppler, and power Doppler as needed of all accessible portions of each vessel. Bilateral testing is considered an integral part of a complete examination. Limited examinations for  reoccurring indications may be performed as noted. The reflux portion of the exam is performed with the patient in reverse Trendelenburg.  +-----+---------------+---------+-----------+----------+--------------+ RIGHTCompressibilityPhasicitySpontaneityPropertiesThrombus Aging +-----+---------------+---------+-----------+----------+--------------+ CFV  Full           Yes      Yes                                 +-----+---------------+---------+-----------+----------+--------------+   +---------+---------------+---------+-----------+----------+--------------+ LEFT     CompressibilityPhasicitySpontaneityPropertiesThrombus Aging +---------+---------------+---------+-----------+----------+--------------+ CFV      Full           Yes      Yes                                 +---------+---------------+---------+-----------+----------+--------------+ SFJ      Full                                                        +---------+---------------+---------+-----------+----------+--------------+ FV Prox  Full                                                        +---------+---------------+---------+-----------+----------+--------------+ FV Mid   Full                                                        +---------+---------------+---------+-----------+----------+--------------+  FV DistalFull                                                        +---------+---------------+---------+-----------+----------+--------------+ PFV      Full                                                        +---------+---------------+---------+-----------+----------+--------------+ POP      Full           Yes      Yes                                 +---------+---------------+---------+-----------+----------+--------------+ PTV      Full                                                        +---------+---------------+---------+-----------+----------+--------------+  PERO     Full                                                        +---------+---------------+---------+-----------+----------+--------------+     Summary: RIGHT: - No evidence of common femoral vein obstruction.  LEFT: - There is no evidence of deep vein thrombosis in the lower extremity.  - No cystic structure found in the popliteal fossa.  *See table(s) above for measurements and observations. Electronically signed by Orlie Pollen on 05/12/2022 at 9:36:15 AM.    Final      Assessment & Plan:   Cynthia was seen today for anemia.  Diagnoses and all orders for this visit:  Pain and swelling of left lower leg- The workup for DVT is negative. -     D-dimer, quantitative; Future -     D-dimer, quantitative -     VAS [place] LOWER EXTREMITY VENOUS (DVT); Future  Other iron deficiency anemia- I recommended that he upgrade to a more effective iron supplement. -     IBC + Ferritin; Future -     CBC with Differential/Platelet; Future -     CBC with Differential/Platelet -     IBC + Ferritin -     Ferric Maltol (ACCRUFER) 30 MG CAPS; Take 1 capsule (30 mg total) by mouth in the morning and at bedtime.  Acute deep vein thrombosis (DVT) of other specified vein of left lower extremity (HCC) -     Discontinue: RIVAROXABAN (XARELTO) VTE STARTER PACK (15 & 20 MG); Follow package directions: Take one 15mg  tablet by mouth twice a day. On day 22, switch to one 20mg  tablet once a day. Take with food.  Gastric bypass status for obesity -     Ferric Maltol (ACCRUFER) 30 MG CAPS; Take 1 capsule (30 mg total) by mouth in the morning and at bedtime.  Class 2 severe obesity due to excess  calories with serious comorbidity and body mass index (BMI) of 37.0 to 37.9 in adult ([place]) -     tirzepatide (ZEPBOUND) 2.5 MG/0.5ML Pen; Inject 2.5 mg into the skin once a week.   I have discontinued Tejuan Gholson. Ahmed's Fusion and Rivaroxaban Stater Pack (15 mg and 20 mg). I am also having him start on Coleman and Zepbound.  Additionally, I am having him maintain his INS SYRINGE/NEEDLE 1CC/28G, Oxycodone HCl, Insulin Pen Needle, DULoxetine, Dodex, zinc gluconate, folic acid, acetaminophen, Baclofen, docusate sodium, gabapentin, lidocaine, magnesium hydroxide, melatonin, multivitamin with minerals, tamsulosin, traZODone, traMADol, sodium chloride, and morphine.  Meds ordered this encounter  Medications   DISCONTD: RIVAROXABAN (XARELTO) VTE STARTER PACK (15 & 20 MG)    Sig: Follow package directions: Take one 15mg  tablet by mouth twice a day. On day 22, switch to one 20mg  tablet once a day. Take with food.    Dispense:  51 each    Refill:  0   Ferric Maltol (ACCRUFER) 30 MG CAPS    Sig: Take 1 capsule (30 mg total) by mouth in the morning and at bedtime.    Dispense:  180 capsule    Refill:  1   tirzepatide (ZEPBOUND) 2.5 MG/0.5ML Pen    Sig: Inject 2.5 mg into the skin once a week.    Dispense:  4 mL    Refill:  0     Follow-up: No follow-ups on file.  [name], MD

## 2022-05-12 ENCOUNTER — Telehealth: Payer: Self-pay | Admitting: *Deleted

## 2022-05-12 ENCOUNTER — Ambulatory Visit (HOSPITAL_COMMUNITY)
Admission: RE | Admit: 2022-05-12 | Discharge: 2022-05-12 | Disposition: A | Discharge status: Home / Self Care | Payer: Medicare Other | Source: Ambulatory Visit | Attending: Vascular Surgery | Admitting: Vascular Surgery

## 2022-05-12 DIAGNOSIS — M7989 Other specified soft tissue disorders: Secondary | ICD-10-CM | POA: Insufficient documentation

## 2022-05-12 DIAGNOSIS — M79662 Pain in left lower leg: Secondary | ICD-10-CM | POA: Insufficient documentation

## 2022-05-12 MED ORDER — ZEPBOUND 2.5 MG/0.5ML ~~LOC~~ SOAJ: 2.5000 mg | SUBCUTANEOUS | 0 refills | Status: DC

## 2022-05-12 NOTE — Telephone Encounter (Signed)
Megan from [facility] called with results of patient venous doppler. Results are in epic. Negative for DVT in left leg

## 2022-05-13 ENCOUNTER — Encounter: Payer: Self-pay | Admitting: Internal Medicine

## 2022-05-15 ENCOUNTER — Other Ambulatory Visit (HOSPITAL_COMMUNITY): Payer: Self-pay

## 2022-05-16 ENCOUNTER — Ambulatory Visit: Payer: Medicare Other | Admitting: Internal Medicine

## 2022-05-19 ENCOUNTER — Ambulatory Visit (HOSPITAL_BASED_OUTPATIENT_CLINIC_OR_DEPARTMENT_OTHER): Payer: Medicare Other | Attending: Physical Medicine and Rehabilitation | Admitting: Physical Therapy

## 2022-05-19 DIAGNOSIS — M6281 Muscle weakness (generalized): Secondary | ICD-10-CM | POA: Diagnosis not present

## 2022-05-19 DIAGNOSIS — M25521 Pain in right elbow: Secondary | ICD-10-CM | POA: Diagnosis not present

## 2022-05-19 DIAGNOSIS — R293 Abnormal posture: Secondary | ICD-10-CM | POA: Insufficient documentation

## 2022-05-19 DIAGNOSIS — R2681 Unsteadiness on feet: Secondary | ICD-10-CM | POA: Insufficient documentation

## 2022-05-19 DIAGNOSIS — M25621 Stiffness of right elbow, not elsewhere classified: Secondary | ICD-10-CM

## 2022-05-19 NOTE — Therapy (Signed)
OUTPATIENT PHYSICAL THERAPY SHOULDER EVALUATION   Patient Name: Bradley [name] MRN: [id number] DOB:Mar 14, 1970, 52 y.o., male Today's Date: 05/22/2022  END OF SESSION:  PT End of Session - 05/22/22 1722     Visit Number 1    Number of Visits 17    Date for PT Re-Evaluation 07/14/22    Authorization Type [employer] MCR    PT Start Time 1147    PT Stop Time 1230    PT Time Calculation (min) 43 min    Activity Tolerance Patient tolerated treatment well    Behavior During Therapy WFL for tasks assessed/performed             Past Medical History:  Diagnosis Date   Allergy    Anxiety    Aortic valve sclerosis    Arthritis    Arthropathy, unspecified, other specified sites    CAD (coronary artery disease)    01/09/20: patient denied; faint coronary calcifications on 2016 CT   Depression    GERD (gastroesophageal reflux disease)    Heart murmur    mild MR, mild AS 06/2019 (Dr. [name])   History of kidney stones    Hypertension     " no longer have hypertension since gastric bypass "   Obesity, unspecified    Other, mixed, or unspecified nondependent drug abuse, unspecified    Personal history of urinary calculi    Priapism    Past Surgical History:  Procedure Laterality Date   ANKLE SURGERY Left 2003   ANTERIOR LAT LUMBAR FUSION Left 02/24/2016   Procedure: LEFT LUMBAR TWO-THREE  ANTERIOR LATERAL LUMBAR FUSION  ;  Surgeon: Earnie Larsson, MD;  Location: [facility];  Service: Neurosurgery;  Laterality: Left;   ANTERIOR LATERAL LUMBAR FUSION WITH PERCUTANEOUS SCREW 1 LEVEL Right 01/13/2020   Procedure: RIGHT LUMBAR ONE-TWO ANTERIOR LATERAL LUMBAR FUSION WITH PERCUTANEOUS SCREW PLACEMENT;  Surgeon: Earnie Larsson, MD;  Location: [facility];  Service: Neurosurgery;  Laterality: Right;  3C   CHOLECYSTECTOMY N/A 01/16/2022   Procedure: LAPAROSCOPIC CHOLECYSTECTOMY WITH INTRAOPERATIVE CHOLANGIOGRAM AND ICG DYE;  Surgeon: Greer Pickerel, MD;  Location: [facility];  Service: General;  Laterality: N/A;    GASTRIC BYPASS  2008   HERNIA REPAIR Left 2005   groin   INCISION AND DRAINAGE ABSCESS Right 07/06/2004   great toe   IRRIGATION AND DEBRIDEMENT ABSCESS Right 07/08/2004   great toe   KNEE ARTHROPLASTY Right 04/26/2017   Procedure: RIGHT TOTAL KNEE ARTHROPLASTY WITH COMPUTER NAVIGATION;  Surgeon: Rod Can, MD;  Location: [facility];  Service: Orthopedics;  Laterality: Right;  Needs RNFA   KNEE SURGERY Right 2004   LUMBAR PERCUTANEOUS PEDICLE SCREW 1 LEVEL Left 02/24/2016   Procedure: LUMBAR PERCUTANEOUS PEDICLE SCREW 1 LEVEL;  Surgeon: Earnie Larsson, MD;  Location: [place];  Service: Neurosurgery;  Laterality: Left;   MICRODISCECTOMY LUMBAR Left 08/24/2008   T12 - L1   ORIF HUMERUS FRACTURE Right 04/17/2022   Procedure: OPEN REDUCTION INTERNAL FIXATION (ORIF) RIGHT DISTAL HUMERUS FRACTURE;  Surgeon: Hiram Gash, MD;  Location: [place];  Service: Orthopedics;  Laterality: Right;   panectomy  2010   TOTAL KNEE ARTHROPLASTY Right 04/2017   Patient Active Problem List   Diagnosis Date Noted   Pain and swelling of left lower leg 05/11/2022   Deep venous thrombosis ([place]) 05/11/2022   Lumbar muscle hematoma 05/02/2022   Humerus shaft fracture--right 05/02/2022   Constipation [id number]   Uncomplicated opioid dependence ([place]) 04/25/2022   Chronic pain syndrome 04/25/2022  Candida infection, oral 03/29/2022   Gastric bypass status for obesity 12/18/2021   Multiple gallstones 12/18/2021   Class 2 severe obesity due to excess calories with serious comorbidity and body mass index (BMI) of 35.0 to 35.9 in adult [facility]) 12/16/2021   Erectile dysfunction due to arterial insufficiency 12/14/2021   Encounter for general adult medical examination with abnormal findings 12/12/2021   Generalized excess and redundant skin after bariatric surgery 06/16/2021   Class 1 obesity due to excess calories with serious comorbidity and body mass index (BMI) of 32.0 to 32.9 in adult 06/15/2021   Benign prostatic  hyperplasia without lower urinary tract symptoms 04/22/2019   Gastroesophageal reflux disease without esophagitis 04/19/2018   Thiamine deficiency 04/14/2018   Other cirrhosis of liver ([place]) 03/13/2018   NASH (nonalcoholic steatohepatitis) 03/13/2018   Low libido 01/28/2018   Hyperlipidemia LDL goal <130 10/25/2017   Insomnia secondary to chronic pain 06/19/2017   Osteoarthritis of left knee 04/26/2017   Degenerative spondylolisthesis 02/24/2016   Routine general medical examination at a health care facility 06/08/2015   Cough variant asthma 02/06/2015   Zinc deficiency 04/12/2014   B12 deficiency anemia 04/08/2014   Vitamin D deficiency 07/26/2012   History of gastric bypass 07/25/2012   Anemia, iron deficiency 07/25/2012   Status post bariatric surgery 07/25/2012   NARCOTIC ABUSE 03/25/2009   EXOGENOUS OBESITY 08/22/2007   Osteoarthritis of right knee 03/12/2007   Essential hypertension 10/08/2006   BACK PAIN, LUMBAR 10/08/2006    REFERRING PROVIDER:  Hiram Gash, MD    REFERRING DIAG: ORIF Humerus, Distal Humerus, Ulnar Nerve Neurolysis 04/17/22   THERAPY DIAG:  Pain in right elbow  Stiffness of right elbow, not elsewhere classified  Abnormal posture  Muscle weakness (generalized)  Rationale for Evaluation and Treatment: Rehabilitation  ONSET DATE: DOS 04/17/22  SUBJECTIVE:                                                                                                                                                                                      SUBJECTIVE STATEMENT: Have been going to pain clinic for about 15 years for back pain. 4 more weeks in cervical collar- Dr Trenton Gammon treating spine. Pt reports fx 9 vertebrae in back, 2 places in neck, 11 ribs, Rt scapula.  Rt-handed   PERTINENT HISTORY: Known Rt RC tear, pt reports frequent dislocations  PAIN:  Are you having pain? Yes: NPRS scale: 6-7/10 Pain location: Pt periscapular/rib cage Pain description:  pain, ache, feel it Aggravating factors: too much movement Relieving factors: pain meds  PRECAUTIONS: None Per op note: Patient is at high risk for implant failure, nonunion, infection as well as  stiffness and heterotopic bone formation secondary to the level comminution.   WEIGHT BEARING RESTRICTIONS: 2lb for 2 wks and then WBAT  FALLS:  Has patient fallen in last 6 months? Yes. Number of falls 1  LIVING ENVIRONMENT: Lives with: lives with their spouse 8 dogs, grand kids on weekend (6 of them)  OCCUPATION: retired  PLOF: Independent  PATIENT GOALS: daily gym workout, independent in ADLs  OBJECTIVE:   PATIENT SURVEYS:  FOTO 25  COGNITION: Overall cognitive status: Within functional limits for tasks assessed     SENSATION: WFL  UPPER EXTREMITY ROM:   A/P ROM Right eval   Shoulder flexion    Shoulder extension    Shoulder abduction    Shoulder adduction    Shoulder internal rotation    Shoulder external rotation    Elbow flexion 90   Elbow extension -34   Wrist flexion 60   Wrist extension 50 p!   Wrist pronation WFL   Wrist supination -45   (Blank rows = not tested)  UPPER EXTREMITY MMT:  MMT Right eval Left eval  Shoulder flexion    Shoulder extension    Shoulder abduction    Shoulder adduction    Shoulder internal rotation    Shoulder external rotation    Middle trapezius    Lower trapezius    Elbow flexion    Elbow extension    Wrist flexion    Wrist extension    Wrist ulnar deviation    Wrist radial deviation    Wrist pronation    Wrist supination    Grip strength (lbs) 20 25  30  60 50 60  (Blank rows = not tested)   PALPATION:  EVAL: stretchy end feel   TODAY'S TREATMENT:                                                                                                                                          Treatment                            05/22/22:  MANUAL mob and stretching to elbow Wrist pronation, supination, flexion,  ext Elbow AROM putty   PATIENT EDUCATION: Education details: Anatomy of condition, POC, HEP, exercise form/rationale Person educated: Patient Education method: Explanation, Demonstration, Tactile cues, Verbal cues, and Handouts Education comprehension: verbalized understanding, returned demonstration, verbal cues required, tactile cues required, and needs further education  HOME EXERCISE PROGRAM: [id number]  ASSESSMENT:  CLINICAL IMPRESSION: Patient is a 52 y.o. M who was seen today for physical therapy evaluation and treatment for s/p Rt humeral shaft ORIF. Pt also has multiple other impairments as result of falling through a ceiling but the focus right now/referral at this time is for the elbow.   OBJECTIVE IMPAIRMENTS: Abnormal gait, decreased activity tolerance, decreased balance, decreased endurance, decreased mobility, difficulty walking, decreased ROM, decreased  strength, hypomobility, increased edema, increased muscle spasms, impaired flexibility, impaired UE functional use, improper body mechanics, postural dysfunction, and pain.   ACTIVITY LIMITATIONS: carrying, lifting, bending, standing, squatting, sleeping, stairs, transfers, bed mobility, bathing, dressing, reach over head, hygiene/grooming, locomotion level, and caring for others  PARTICIPATION LIMITATIONS: meal prep, cleaning, laundry, driving, shopping, community activity, and yard work  PERSONAL FACTORS:  see history  are also affecting patient's functional outcome.   REHAB POTENTIAL: Good  CLINICAL DECISION MAKING: Evolving/moderate complexity  EVALUATION COMPLEXITY: Moderate   GOALS: Goals reviewed with patient? Yes  SHORT TERM GOALS: Target date: 3/29  Elbow extension to 0 deg Baseline: Goal status: INITIAL    LONG TERM GOALS: Target date: POC date  Meet FOTO goal Baseline:  Goal status: INITIAL  2.  Independent in self-care and dressing without limitation by elbow ROM or pain Baseline:  Goal  status: INITIAL  3.  Grip strength within 5 lb of opposite UE Baseline:  Goal status: INITIAL  4.  Able to lift/carry a plate of food without limitation by elbow Baseline:  Goal status: INITIAL   PLAN:  PT FREQUENCY: 1-2x/week  PT DURATION: 8 weeks  PLANNED INTERVENTIONS: Therapeutic exercises, Therapeutic activity, Neuromuscular re-education, Patient/Family education, Self Care, Joint mobilization, Aquatic Therapy, Dry Needling, Electrical stimulation, Spinal mobilization, Cryotherapy, Moist heat, scar mobilization, Taping, Vasopneumatic device, Traction, Ultrasound, Ionotophoresis 4mg /ml Dexamethasone, Manual therapy, and Re-evaluation  PLAN FOR NEXT SESSION: continue ROM, schedule more appts   Mackenzi Krogh C. Zimal Weisensel PT, DPT 05/22/22 5:32 PM

## 2022-05-22 ENCOUNTER — Encounter (HOSPITAL_BASED_OUTPATIENT_CLINIC_OR_DEPARTMENT_OTHER): Payer: Self-pay | Admitting: Physical Therapy

## 2022-05-22 ENCOUNTER — Other Ambulatory Visit: Payer: Self-pay

## 2022-05-26 ENCOUNTER — Other Ambulatory Visit (HOSPITAL_COMMUNITY): Payer: Self-pay

## 2022-05-26 ENCOUNTER — Other Ambulatory Visit: Payer: Self-pay

## 2022-05-28 NOTE — Therapy (Signed)
OUTPATIENT PHYSICAL THERAPY SHOULDER TREATMENT   Patient Name: Bradley [name] MRN: [id number] DOB:08-06-1970, 52 y.o., male Today's Date: 05/29/2022  END OF SESSION:  PT End of Session - 05/29/22 1342     Visit Number 2    Number of Visits 17    Date for PT Re-Evaluation 07/14/22    Authorization Type UHC MCR    PT Start Time [id number]    PT Stop Time [id number]    PT Time Calculation (min) 34 min    Activity Tolerance Patient tolerated treatment well    Behavior During Therapy WFL for tasks assessed/performed              Past Medical History:  Diagnosis Date   Allergy    Anxiety    Aortic valve sclerosis    Arthritis    Arthropathy, unspecified, other specified sites    CAD (coronary artery disease)    01/09/20: patient denied; faint coronary calcifications on 2016 CT   Depression    GERD (gastroesophageal reflux disease)    Heart murmur    mild MR, mild AS 06/2019 (Dr. [name])   History of kidney stones    Hypertension     " no longer have hypertension since gastric bypass "   Obesity, unspecified    Other, mixed, or unspecified nondependent drug abuse, unspecified    Personal history of urinary calculi    Priapism    Past Surgical History:  Procedure Laterality Date   ANKLE SURGERY Left 2003   ANTERIOR LAT LUMBAR FUSION Left 02/24/2016   Procedure: LEFT LUMBAR TWO-THREE  ANTERIOR LATERAL LUMBAR FUSION  ;  Surgeon: Earnie Larsson, MD;  Location: [place];  Service: Neurosurgery;  Laterality: Left;   ANTERIOR LATERAL LUMBAR FUSION WITH PERCUTANEOUS SCREW 1 LEVEL Right 01/13/2020   Procedure: RIGHT LUMBAR ONE-TWO ANTERIOR LATERAL LUMBAR FUSION WITH PERCUTANEOUS SCREW PLACEMENT;  Surgeon: Earnie Larsson, MD;  Location: [place];  Service: Neurosurgery;  Laterality: Right;  3C   CHOLECYSTECTOMY N/A 01/16/2022   Procedure: LAPAROSCOPIC CHOLECYSTECTOMY WITH INTRAOPERATIVE CHOLANGIOGRAM AND ICG DYE;  Surgeon: Greer Pickerel, MD;  [facility];  Service: General;  Laterality: N/A;    GASTRIC BYPASS  2008   HERNIA REPAIR Left 2005   groin   INCISION AND DRAINAGE ABSCESS Right 07/06/2004   great toe   IRRIGATION AND DEBRIDEMENT ABSCESS Right 07/08/2004   great toe   KNEE ARTHROPLASTY Right 04/26/2017   Procedure: RIGHT TOTAL KNEE ARTHROPLASTY WITH COMPUTER NAVIGATION;  Surgeon: Rod Can, MD;  Location: [facility];  Service: Orthopedics;  Laterality: Right;  Needs RNFA   KNEE SURGERY Right 2004   LUMBAR PERCUTANEOUS PEDICLE SCREW 1 LEVEL Left 02/24/2016   Procedure: LUMBAR PERCUTANEOUS PEDICLE SCREW 1 LEVEL;  Surgeon: Earnie Larsson, MD;  Location: [place];  Service: Neurosurgery;  Laterality: Left;   MICRODISCECTOMY LUMBAR Left 08/24/2008   T12 - L1   ORIF HUMERUS FRACTURE Right 04/17/2022   Procedure: OPEN REDUCTION INTERNAL FIXATION (ORIF) RIGHT DISTAL HUMERUS FRACTURE;  Surgeon: Hiram Gash, MD;  Location: [place];  Service: Orthopedics;  Laterality: Right;   panectomy  2010   TOTAL KNEE ARTHROPLASTY Right 04/2017   Patient Active Problem List   Diagnosis Date Noted   Pain and swelling of left lower leg 05/11/2022   Deep venous thrombosis ([place]) 05/11/2022   Lumbar muscle hematoma 05/02/2022   Humerus shaft fracture--right 05/02/2022   Constipation [id number]   Uncomplicated opioid dependence ([place]) 04/25/2022   Chronic pain syndrome 04/25/2022  Candida infection, oral 03/29/2022   Gastric bypass status for obesity 12/18/2021   Multiple gallstones 12/18/2021   Class 2 severe obesity due to excess calories with serious comorbidity and body mass index (BMI) of 35.0 to 35.9 in adult [facility]) 12/16/2021   Erectile dysfunction due to arterial insufficiency 12/14/2021   Encounter for general adult medical examination with abnormal findings 12/12/2021   Generalized excess and redundant skin after bariatric surgery 06/16/2021   Class 1 obesity due to excess calories with serious comorbidity and body mass index (BMI) of 32.0 to 32.9 in adult 06/15/2021   Benign prostatic  hyperplasia without lower urinary tract symptoms 04/22/2019   Gastroesophageal reflux disease without esophagitis 04/19/2018   Thiamine deficiency 04/14/2018   Other cirrhosis of liver ([place]) 03/13/2018   NASH (nonalcoholic steatohepatitis) 03/13/2018   Low libido 01/28/2018   Hyperlipidemia LDL goal <130 10/25/2017   Insomnia secondary to chronic pain 06/19/2017   Osteoarthritis of left knee 04/26/2017   Degenerative spondylolisthesis 02/24/2016   Routine general medical examination at a health care facility 06/08/2015   Cough variant asthma 02/06/2015   Zinc deficiency 04/12/2014   B12 deficiency anemia 04/08/2014   Vitamin D deficiency 07/26/2012   History of gastric bypass 07/25/2012   Anemia, iron deficiency 07/25/2012   Status post bariatric surgery 07/25/2012   NARCOTIC ABUSE 03/25/2009   EXOGENOUS OBESITY 08/22/2007   Osteoarthritis of right knee 03/12/2007   Essential hypertension 10/08/2006   BACK PAIN, LUMBAR 10/08/2006    REFERRING PROVIDER:  Hiram Gash, MD    REFERRING DIAG: ORIF Humerus, Distal Humerus, Ulnar Nerve Neurolysis 04/17/22   THERAPY DIAG:  Pain in right elbow  Stiffness of right elbow, not elsewhere classified  Abnormal posture  Muscle weakness (generalized)  Rationale for Evaluation and Treatment: Rehabilitation  ONSET DATE: DOS 04/17/22  SUBJECTIVE:                                                                                                                                                                                      SUBJECTIVE STATEMENT: The elbow was sore but no pain. Only pain when I bump the screw.   EVAL: Have been going to pain clinic for about 15 years for back pain. 4 more weeks in cervical collar- Dr Trenton Gammon treating spine. Pt reports fx 9 vertebrae in back, 2 places in neck, 11 ribs, Rt scapula.  Rt-handed   PERTINENT HISTORY: Known Rt RC tear, pt reports frequent dislocations  PAIN:  Are you having pain? Yes: NPRS  scale: 6/10 Pain location: Pt periscapular/rib cage Pain description: pain, ache, feel it Aggravating factors: too much movement Relieving factors: pain meds  PRECAUTIONS:  None Per op note: Patient is at high risk for implant failure, nonunion, infection as well as stiffness and heterotopic bone formation secondary to the level comminution.   WEIGHT BEARING RESTRICTIONS: 2lb for 2 wks and then WBAT  FALLS:  Has patient fallen in last 6 months? Yes. Number of falls 1  LIVING ENVIRONMENT: Lives with: lives with their spouse 8 dogs, grand kids on weekend (6 of them)  OCCUPATION: retired  PLOF: Independent  PATIENT GOALS: daily gym workout, independent in ADLs  OBJECTIVE:   PATIENT SURVEYS:  FOTO 25  COGNITION: Overall cognitive status: Within functional limits for tasks assessed     SENSATION: [facility]  UPPER EXTREMITY ROM:   A/P ROM Right eval Rt 3/25  Shoulder flexion    Shoulder extension    Shoulder abduction    Shoulder adduction    Shoulder internal rotation    Shoulder external rotation    Elbow flexion 90 95  Elbow extension -34 -20  Wrist flexion 60   Wrist extension 50 p!   Wrist pronation WFL   Wrist supination -45 -30  (Blank rows = not tested)  UPPER EXTREMITY MMT:  MMT Right eval Left eval  Shoulder flexion    Shoulder extension    Shoulder abduction    Shoulder adduction    Shoulder internal rotation    Shoulder external rotation    Middle trapezius    Lower trapezius    Elbow flexion    Elbow extension    Wrist flexion    Wrist extension    Wrist ulnar deviation    Wrist radial deviation    Wrist pronation    Wrist supination    Grip strength (lbs) 20 25  30  60 50 60  (Blank rows = not tested)   PALPATION:  EVAL: stretchy end feel   TODAY'S TREATMENT:                                                                                                                                          Treatment                             05/29/22:  MANUAL STM biceps, wrist flexor & extensor group, scar mobs, joint mobs at end range at elbow and radius with supination Eccentric biceps curls Pronation/supination holding red therabar at the end Therabar bending- pronation & supination Resisted triceps ext yellow tband   Treatment                            05/22/22:  MANUAL mob and stretching to elbow Wrist pronation, supination, flexion, ext Elbow AROM putty   PATIENT EDUCATION: Education details: Anatomy of condition, POC, HEP, exercise form/rationale Person educated: Patient Education method: Explanation, Demonstration, Tactile cues, Verbal cues, and Handouts Education comprehension:  verbalized understanding, returned demonstration, verbal cues required, tactile cues required, and needs further education  HOME EXERCISE PROGRAM: [id number]  ASSESSMENT:  CLINICAL IMPRESSION: Continued progression of elbow ROM. Guarded due to discomfort and asked that he let his arm hang straight down when walking rather than holding elbow bent. Seeing Dr Griffin Basil tomorrow and will update precautions. Will continue to progress as appropriate considering discomfort at shoulder/scapula. Does require cuing to decrease shoulder hike.   OBJECTIVE IMPAIRMENTS: Abnormal gait, decreased activity tolerance, decreased balance, decreased endurance, decreased mobility, difficulty walking, decreased ROM, decreased strength, hypomobility, increased edema, increased muscle spasms, impaired flexibility, impaired UE functional use, improper body mechanics, postural dysfunction, and pain.   ACTIVITY LIMITATIONS: carrying, lifting, bending, standing, squatting, sleeping, stairs, transfers, bed mobility, bathing, dressing, reach over head, hygiene/grooming, locomotion level, and caring for others  PARTICIPATION LIMITATIONS: meal prep, cleaning, laundry, driving, shopping, community activity, and yard work  PERSONAL FACTORS:  see history  are also affecting  patient's functional outcome.   REHAB POTENTIAL: Good  CLINICAL DECISION MAKING: Evolving/moderate complexity  EVALUATION COMPLEXITY: Moderate   GOALS: Goals reviewed with patient? Yes  SHORT TERM GOALS: Target date: 3/29  Elbow extension to 0 deg Baseline: Goal status: INITIAL    LONG TERM GOALS: Target date: POC date  Meet FOTO goal Baseline:  Goal status: INITIAL  2.  Independent in self-care and dressing without limitation by elbow ROM or pain Baseline:  Goal status: INITIAL  3.  Grip strength within 5 lb of opposite UE Baseline:  Goal status: INITIAL  4.  Able to lift/carry a plate of food without limitation by elbow Baseline:  Goal status: INITIAL   PLAN:  PT FREQUENCY: 1-2x/week  PT DURATION: 8 weeks  PLANNED INTERVENTIONS: Therapeutic exercises, Therapeutic activity, Neuromuscular re-education, Patient/Family education, Self Care, Joint mobilization, Aquatic Therapy, Dry Needling, Electrical stimulation, Spinal mobilization, Cryotherapy, Moist heat, scar mobilization, Taping, Vasopneumatic device, Traction, Ultrasound, Ionotophoresis 4mg /ml Dexamethasone, Manual therapy, and Re-evaluation  PLAN FOR NEXT SESSION: continue ROM, scar mob, radial mobs into supination   Mikylah Ackroyd C. Jeanie Mccard PT, DPT 05/29/22 2:19 PM

## 2022-05-29 ENCOUNTER — Encounter (HOSPITAL_BASED_OUTPATIENT_CLINIC_OR_DEPARTMENT_OTHER): Payer: Self-pay | Admitting: Physical Therapy

## 2022-05-29 ENCOUNTER — Ambulatory Visit (HOSPITAL_BASED_OUTPATIENT_CLINIC_OR_DEPARTMENT_OTHER): Payer: Medicare Other | Admitting: Physical Therapy

## 2022-05-29 ENCOUNTER — Encounter: Payer: Self-pay | Admitting: Internal Medicine

## 2022-05-29 DIAGNOSIS — M25621 Stiffness of right elbow, not elsewhere classified: Secondary | ICD-10-CM

## 2022-05-29 DIAGNOSIS — R293 Abnormal posture: Secondary | ICD-10-CM

## 2022-05-29 DIAGNOSIS — M25521 Pain in right elbow: Secondary | ICD-10-CM | POA: Diagnosis not present

## 2022-05-29 DIAGNOSIS — M6281 Muscle weakness (generalized): Secondary | ICD-10-CM

## 2022-05-29 DIAGNOSIS — R2681 Unsteadiness on feet: Secondary | ICD-10-CM | POA: Diagnosis not present

## 2022-05-30 DIAGNOSIS — S46011A Strain of muscle(s) and tendon(s) of the rotator cuff of right shoulder, initial encounter: Secondary | ICD-10-CM | POA: Diagnosis not present

## 2022-05-30 DIAGNOSIS — S12601A Unspecified nondisplaced fracture of seventh cervical vertebra, initial encounter for closed fracture: Secondary | ICD-10-CM | POA: Diagnosis not present

## 2022-05-30 DIAGNOSIS — S52021A Displaced fracture of olecranon process without intraarticular extension of right ulna, initial encounter for closed fracture: Secondary | ICD-10-CM | POA: Diagnosis not present

## 2022-05-30 DIAGNOSIS — S43301A Subluxation of unspecified parts of right shoulder girdle, initial encounter: Secondary | ICD-10-CM | POA: Diagnosis not present

## 2022-05-30 DIAGNOSIS — S42101A Fracture of unspecified part of scapula, right shoulder, initial encounter for closed fracture: Secondary | ICD-10-CM | POA: Diagnosis not present

## 2022-05-30 DIAGNOSIS — S42102A Fracture of unspecified part of scapula, left shoulder, initial encounter for closed fracture: Secondary | ICD-10-CM | POA: Diagnosis not present

## 2022-05-30 DIAGNOSIS — S42351D Displaced comminuted fracture of shaft of humerus, right arm, subsequent encounter for fracture with routine healing: Secondary | ICD-10-CM | POA: Diagnosis not present

## 2022-06-01 ENCOUNTER — Other Ambulatory Visit: Payer: Self-pay | Admitting: Internal Medicine

## 2022-06-01 DIAGNOSIS — N400 Enlarged prostate without lower urinary tract symptoms: Secondary | ICD-10-CM

## 2022-06-01 DIAGNOSIS — G8929 Other chronic pain: Secondary | ICD-10-CM

## 2022-06-01 DIAGNOSIS — K5904 Chronic idiopathic constipation: Secondary | ICD-10-CM

## 2022-06-01 MED ORDER — DOCUSATE SODIUM 100 MG PO CAPS: 100.0000 mg | ORAL_CAPSULE | Freq: Two times a day (BID) | ORAL | 0 refills | Status: DC

## 2022-06-01 MED ORDER — TRAZODONE HCL 50 MG PO TABS: 50.0000 mg | ORAL_TABLET | Freq: Every day | ORAL | 0 refills | Status: DC

## 2022-06-01 MED ORDER — TAMSULOSIN HCL 0.4 MG PO CAPS: 0.8000 mg | ORAL_CAPSULE | Freq: Every day | ORAL | 0 refills | Status: DC

## 2022-06-05 ENCOUNTER — Encounter (HOSPITAL_BASED_OUTPATIENT_CLINIC_OR_DEPARTMENT_OTHER): Payer: Self-pay | Admitting: Physical Therapy

## 2022-06-05 ENCOUNTER — Ambulatory Visit (HOSPITAL_BASED_OUTPATIENT_CLINIC_OR_DEPARTMENT_OTHER): Payer: Medicare Other | Attending: Physical Medicine and Rehabilitation | Admitting: Physical Therapy

## 2022-06-05 DIAGNOSIS — M25621 Stiffness of right elbow, not elsewhere classified: Secondary | ICD-10-CM

## 2022-06-05 DIAGNOSIS — M6281 Muscle weakness (generalized): Secondary | ICD-10-CM | POA: Diagnosis not present

## 2022-06-05 DIAGNOSIS — R293 Abnormal posture: Secondary | ICD-10-CM | POA: Diagnosis not present

## 2022-06-05 DIAGNOSIS — M25521 Pain in right elbow: Secondary | ICD-10-CM | POA: Diagnosis not present

## 2022-06-05 NOTE — Therapy (Signed)
OUTPATIENT PHYSICAL THERAPY SHOULDER TREATMENT   Patient Name: Bradley [name] MRN: [id number] DOB:10-11-1970, 52 y.o., male Today's Date: 06/05/2022  END OF SESSION:  PT End of Session - 06/05/22 1517     Visit Number 3    Number of Visits 17    Date for PT Re-Evaluation 07/14/22    Authorization Type UHC MCR    PT Start Time 1320    PT Stop Time 1355    PT Time Calculation (min) 35 min    Activity Tolerance Patient tolerated treatment well;No increased pain    Behavior During Therapy [facility] for tasks assessed/performed               Past Medical History:  Diagnosis Date   Allergy    Anxiety    Aortic valve sclerosis    Arthritis    Arthropathy, unspecified, other specified sites    CAD (coronary artery disease)    01/09/20: patient denied; faint coronary calcifications on 2016 CT   Depression    GERD (gastroesophageal reflux disease)    Heart murmur    mild MR, mild AS 06/2019 (Dr. [name])   History of kidney stones    Hypertension     " no longer have hypertension since gastric bypass "   Obesity, unspecified    Other, mixed, or unspecified nondependent drug abuse, unspecified    Personal history of urinary calculi    Priapism    Past Surgical History:  Procedure Laterality Date   ANKLE SURGERY Left 2003   ANTERIOR LAT LUMBAR FUSION Left 02/24/2016   Procedure: LEFT LUMBAR TWO-THREE  ANTERIOR LATERAL LUMBAR FUSION  ;  Surgeon: Earnie Larsson, MD;  Location: [place];  Service: Neurosurgery;  Laterality: Left;   ANTERIOR LATERAL LUMBAR FUSION WITH PERCUTANEOUS SCREW 1 LEVEL Right 01/13/2020   Procedure: RIGHT LUMBAR ONE-TWO ANTERIOR LATERAL LUMBAR FUSION WITH PERCUTANEOUS SCREW PLACEMENT;  Surgeon: Earnie Larsson, MD;  Location: [place];  Service: Neurosurgery;  Laterality: Right;  3C   CHOLECYSTECTOMY N/A 01/16/2022   Procedure: LAPAROSCOPIC CHOLECYSTECTOMY WITH INTRAOPERATIVE CHOLANGIOGRAM AND ICG DYE;  Surgeon: Greer Pickerel, MD;  [facility];  Service: General;   Laterality: N/A;   GASTRIC BYPASS  2008   HERNIA REPAIR Left 2005   groin   INCISION AND DRAINAGE ABSCESS Right 07/06/2004   great toe   IRRIGATION AND DEBRIDEMENT ABSCESS Right 07/08/2004   great toe   KNEE ARTHROPLASTY Right 04/26/2017   Procedure: RIGHT TOTAL KNEE ARTHROPLASTY WITH COMPUTER NAVIGATION;  Surgeon: Rod Can, MD;  [facility];  Service: Orthopedics;  Laterality: Right;  Needs RNFA   KNEE SURGERY Right 2004   LUMBAR PERCUTANEOUS PEDICLE SCREW 1 LEVEL Left 02/24/2016   Procedure: LUMBAR PERCUTANEOUS PEDICLE SCREW 1 LEVEL;  Surgeon: Earnie Larsson, MD;  Location: [place];  Service: Neurosurgery;  Laterality: Left;   MICRODISCECTOMY LUMBAR Left 08/24/2008   T12 - L1   ORIF HUMERUS FRACTURE Right 04/17/2022   Procedure: OPEN REDUCTION INTERNAL FIXATION (ORIF) RIGHT DISTAL HUMERUS FRACTURE;  Surgeon: Hiram Gash, MD;  Location: [place];  Service: Orthopedics;  Laterality: Right;   panectomy  2010   TOTAL KNEE ARTHROPLASTY Right 04/2017   Patient Active Problem List   Diagnosis Date Noted   Pain and swelling of left lower leg 05/11/2022   Deep venous thrombosis 05/11/2022   Lumbar muscle hematoma 05/02/2022   Humerus shaft fracture--right 05/02/2022   Constipation 123XX123   Uncomplicated opioid dependence 04/25/2022   Chronic pain syndrome 04/25/2022  Candida infection, oral 03/29/2022   Gastric bypass status for obesity 12/18/2021   Multiple gallstones 12/18/2021   Class 2 severe obesity due to excess calories with serious comorbidity and body mass index (BMI) of 35.0 to 35.9 in adult 12/16/2021   Erectile dysfunction due to arterial insufficiency 12/14/2021   Encounter for general adult medical examination with abnormal findings 12/12/2021   Generalized excess and redundant skin after bariatric surgery 06/16/2021   Class 1 obesity due to excess calories with serious comorbidity and body mass index (BMI) of 32.0 to 32.9 in adult 06/15/2021   Benign prostatic  hyperplasia without lower urinary tract symptoms 04/22/2019   Gastroesophageal reflux disease without esophagitis 04/19/2018   Thiamine deficiency 04/14/2018   Other cirrhosis of liver 03/13/2018   NASH (nonalcoholic steatohepatitis) 03/13/2018   Low libido 01/28/2018   Hyperlipidemia LDL goal <130 10/25/2017   Insomnia secondary to chronic pain 06/19/2017   Osteoarthritis of left knee 04/26/2017   Degenerative spondylolisthesis 02/24/2016   Routine general medical examination at a health care facility 06/08/2015   Cough variant asthma 02/06/2015   Zinc deficiency 04/12/2014   B12 deficiency anemia 04/08/2014   Vitamin D deficiency 07/26/2012   History of gastric bypass 07/25/2012   Anemia, iron deficiency 07/25/2012   Status post bariatric surgery 07/25/2012   NARCOTIC ABUSE 03/25/2009   EXOGENOUS OBESITY 08/22/2007   Osteoarthritis of right knee 03/12/2007   Essential hypertension 10/08/2006   BACK PAIN, LUMBAR 10/08/2006    REFERRING PROVIDER:  Hiram Gash, MD    REFERRING DIAG: ORIF Humerus, Distal Humerus, Ulnar Nerve Neurolysis 04/17/22   THERAPY DIAG:  Pain in right elbow  Stiffness of right elbow, not elsewhere classified  Muscle weakness (generalized)  Rationale for Evaluation and Treatment: Rehabilitation  ONSET DATE: DOS 04/17/22  SUBJECTIVE:                                                                                                                                                                                      SUBJECTIVE STATEMENT: Pt is 7 weeks s/p Right distal humerus ORIF, humeral shaft ORIF, and ulnar nerve neurolysis with anterior transposition  Pt denies any adverse effects after prior Rx.  Pt reports compliance with HEP.  Pt is taking his pain meds.  Pt saw Dr. Griffin Basil and pt states he thought everything was looking good.  Pt states he received clearance to go the gym from MD with a 5# lifting restriction.  Pt states MD doesn't want him to  push against resistance with exercises in the gym and also not to push up with transfers.  Pt also received an injection in his R shoulder which  improved his shoulder sx's.  He has a hx of getting shoulder injections.   Pt states he is doing much better than initially.  He wasn't able to move his fingers well after the accident, but now has good mobility with his fingers.  He has been ambulating with SPC for about a week and uses the W/C if he is out for awhile or has to walk a long distance.    EVAL: Have been going to pain clinic for about 15 years for back pain. 4 more weeks in cervical collar- Dr Trenton Gammon treating spine. Pt reports fx 9 vertebrae in back, 2 places in neck, 11 ribs, Rt scapula.  Rt-handed   PERTINENT HISTORY: -Pt states MD doesn't want him to perform exercises pushing against resistance and also not to push up with R UE with transfers. -Pt reports having multiple fractures in back, 2 fractures in cervical, and also 11 rib fractures  Known Rt RC tear, pt reports frequent dislocations 2 prior Lumbar fusions with last fusion being 01/23 Chronic back and R shoulder pain R TKR   PAIN:  Are you having pain? Yes: NPRS scale: 5/10 Pain location: R humerus Pain description: dull Aggravating factors: too much movement Relieving factors: pain meds  PRECAUTIONS: None Per op note: Patient is at high risk for implant failure, nonunion, infection as well as stiffness and heterotopic bone formation secondary to the level comminution.   WEIGHT BEARING RESTRICTIONS: 2lb for 2 wks and then WBAT;  Pt states MD doesn't want him to perform exercises pushing against resistance and also not to push up with R UE with transfers.  Pt reports MD informed him to not lift > 5 lbs in the gym.  FALLS:  Has patient fallen in last 6 months? Yes. Number of falls 1  LIVING ENVIRONMENT: Lives with: lives with their spouse 8 dogs, grand kids on weekend (6 of them)  OCCUPATION: retired  PLOF:  Independent  PATIENT GOALS: daily gym workout, independent in ADLs  OBJECTIVE:   PATIENT SURVEYS:  FOTO 25  COGNITION: Overall cognitive status: Within functional limits for tasks assessed     SENSATION: [facility]  UPPER EXTREMITY ROM:   A/P ROM Right eval Rt 3/25  Shoulder flexion    Shoulder extension    Shoulder abduction    Shoulder adduction    Shoulder internal rotation    Shoulder external rotation    Elbow flexion 90 95  Elbow extension -34 -20  Wrist flexion 60   Wrist extension 50 p!   Wrist pronation WFL   Wrist supination -45 -30  (Blank rows = not tested)  UPPER EXTREMITY MMT:  MMT Right eval Left eval  Shoulder flexion    Shoulder extension    Shoulder abduction    Shoulder adduction    Shoulder internal rotation    Shoulder external rotation    Middle trapezius    Lower trapezius    Elbow flexion    Elbow extension    Wrist flexion    Wrist extension    Wrist ulnar deviation    Wrist radial deviation    Wrist pronation    Wrist supination    Grip strength (lbs) 20 25  30  60 50 60  (Blank rows = not tested)   PALPATION:  EVAL: stretchy end feel   TODAY'S TREATMENT:  06/05/2022:  Reviewed pt presentation, pain level, and response to prior Rx. Pt performed: Pt performed:  Elbow flexion/extension AROM x 15 reps  Pronation/supination approx 20 reps AROM and aprox 20 reps with red Therabar  Wrist flexion and extension AROM 2x10   Seated static elbow extension stretch with towel under arm x 2 mins  Pt received R elbow flexion and extension PROM seated, wrist flex/ext, radial and ulnar deviation PROM seated with arm supported on edge of table, and forearm pronation and supination PROM with arms supported w/n pt and tissue tolerance.   Treatment                            05/29/22:  MANUAL STM biceps, wrist  flexor & extensor group, scar mobs, joint mobs at end range at elbow and radius with supination Eccentric biceps curls Pronation/supination holding red therabar at the end Therabar bending- pronation & supination Resisted triceps ext yellow tband   Treatment                            05/22/22:  MANUAL mob and stretching to elbow Wrist pronation, supination, flexion, ext Elbow AROM putty   PATIENT EDUCATION: Education details: Anatomy of condition, POC, HEP, exercise form/rationale Person educated: Patient Education method: Explanation, Demonstration, Tactile cues, Verbal cues, and Handouts Education comprehension: verbalized understanding, returned demonstration, verbal cues required, tactile cues required, and needs further education  HOME EXERCISE PROGRAM: [id number]  ASSESSMENT:  CLINICAL IMPRESSION: Pt is improving with elbow and forearm ROM.  He continues to be limited with supination and elbow extension ROM which improved with exercises and PROM.  He performed exercises well with cuing for correct form and tolerated exercises well.  He saw MD and reports MD informed him to not perform pushing exercises in the gym and to not perform resistance > 5 lbs.  He was also instructed to not push up with R UE with transfers.  Pt tolerated PROM well and responded well to Rx having no increased pain after exercises.  He should benefit from cont skilled PT services to address ongoing goals and improve ROM and function.    OBJECTIVE IMPAIRMENTS: Abnormal gait, decreased activity tolerance, decreased balance, decreased endurance, decreased mobility, difficulty walking, decreased ROM, decreased strength, hypomobility, increased edema, increased muscle spasms, impaired flexibility, impaired UE functional use, improper body mechanics, postural dysfunction, and pain.   ACTIVITY LIMITATIONS: carrying, lifting, bending, standing, squatting, sleeping, stairs, transfers, bed mobility, bathing, dressing,  reach over head, hygiene/grooming, locomotion level, and caring for others  PARTICIPATION LIMITATIONS: meal prep, cleaning, laundry, driving, shopping, community activity, and yard work  PERSONAL FACTORS:  see history  are also affecting patient's functional outcome.   REHAB POTENTIAL: Good  CLINICAL DECISION MAKING: Evolving/moderate complexity  EVALUATION COMPLEXITY: Moderate   GOALS: Goals reviewed with patient? Yes  SHORT TERM GOALS: Target date: 3/29  Elbow extension to 0 deg Baseline: Goal status: INITIAL    LONG TERM GOALS: Target date: POC date  Meet FOTO goal Baseline:  Goal status: INITIAL  2.  Independent in self-care and dressing without limitation by elbow ROM or pain Baseline:  Goal status: INITIAL  3.  Grip strength within 5 lb of opposite UE Baseline:  Goal status: INITIAL  4.  Able to lift/carry a plate of food without limitation by elbow Baseline:  Goal status: INITIAL   PLAN:  PT FREQUENCY: 1-2x/week  PT DURATION:  8 weeks  PLANNED INTERVENTIONS: Therapeutic exercises, Therapeutic activity, Neuromuscular re-education, Patient/Family education, Self Care, Joint mobilization, Aquatic Therapy, Dry Needling, Electrical stimulation, Spinal mobilization, Cryotherapy, Moist heat, scar mobilization, Taping, Vasopneumatic device, Traction, Ultrasound, Ionotophoresis 4mg /ml Dexamethasone, Manual therapy, and Re-evaluation  PLAN FOR NEXT SESSION: continue with ROM, ther ex, and manual therapy.    Selinda Michaels III PT, DPT 06/05/22 5:29 PM

## 2022-06-06 ENCOUNTER — Encounter (HOSPITAL_BASED_OUTPATIENT_CLINIC_OR_DEPARTMENT_OTHER): Payer: Medicare Other | Admitting: Physical Therapy

## 2022-06-06 NOTE — Therapy (Unsigned)
OUTPATIENT PHYSICAL THERAPY SHOULDER TREATMENT   Patient Name: Bradley [name] MRN: [id number] DOB:03-11-1970, 52 y.o., male Today's Date: 06/06/2022  END OF SESSION:      Past Medical History:  Diagnosis Date   Allergy    Anxiety    Aortic valve sclerosis    Arthritis    Arthropathy, unspecified, other specified sites    CAD (coronary artery disease)    01/09/20: patient denied; faint coronary calcifications on 2016 CT   Depression    GERD (gastroesophageal reflux disease)    Heart murmur    mild MR, mild AS 06/2019 (Dr. [name])   History of kidney stones    Hypertension     " no longer have hypertension since gastric bypass "   Obesity, unspecified    Other, mixed, or unspecified nondependent drug abuse, unspecified    Personal history of urinary calculi    Priapism    Past Surgical History:  Procedure Laterality Date   ANKLE SURGERY Left 2003   ANTERIOR LAT LUMBAR FUSION Left 02/24/2016   Procedure: LEFT LUMBAR TWO-THREE  ANTERIOR LATERAL LUMBAR FUSION  ;  Surgeon: Earnie Larsson, MD;  Location: [place];  Service: Neurosurgery;  Laterality: Left;   ANTERIOR LATERAL LUMBAR FUSION WITH PERCUTANEOUS SCREW 1 LEVEL Right 01/13/2020   Procedure: RIGHT LUMBAR ONE-TWO ANTERIOR LATERAL LUMBAR FUSION WITH PERCUTANEOUS SCREW PLACEMENT;  Surgeon: Earnie Larsson, MD;  Location: [place];  Service: Neurosurgery;  Laterality: Right;  3C   CHOLECYSTECTOMY N/A 01/16/2022   Procedure: LAPAROSCOPIC CHOLECYSTECTOMY WITH INTRAOPERATIVE CHOLANGIOGRAM AND ICG DYE;  Surgeon: Greer Pickerel, MD;  Location: [facility];  Service: General;  Laterality: N/A;   GASTRIC BYPASS  2008   HERNIA REPAIR Left 2005   groin   INCISION AND DRAINAGE ABSCESS Right 07/06/2004   great toe   IRRIGATION AND DEBRIDEMENT ABSCESS Right 07/08/2004   great toe   KNEE ARTHROPLASTY Right 04/26/2017   Procedure: RIGHT TOTAL KNEE ARTHROPLASTY WITH COMPUTER NAVIGATION;  Surgeon: Rod Can, MD;  Location: [facility];  Service:  Orthopedics;  Laterality: Right;  Needs RNFA   KNEE SURGERY Right 2004   LUMBAR PERCUTANEOUS PEDICLE SCREW 1 LEVEL Left 02/24/2016   Procedure: LUMBAR PERCUTANEOUS PEDICLE SCREW 1 LEVEL;  Surgeon: Earnie Larsson, MD;  Location: [place];  Service: Neurosurgery;  Laterality: Left;   MICRODISCECTOMY LUMBAR Left 08/24/2008   T12 - L1   ORIF HUMERUS FRACTURE Right 04/17/2022   Procedure: OPEN REDUCTION INTERNAL FIXATION (ORIF) RIGHT DISTAL HUMERUS FRACTURE;  Surgeon: Hiram Gash, MD;  Location: [place];  Service: Orthopedics;  Laterality: Right;   panectomy  2010   TOTAL KNEE ARTHROPLASTY Right 04/2017   Patient Active Problem List   Diagnosis Date Noted   Pain and swelling of left lower leg 05/11/2022   Deep venous thrombosis 05/11/2022   Lumbar muscle hematoma 05/02/2022   Humerus shaft fracture--right 05/02/2022   Constipation [id number]   Uncomplicated opioid dependence 04/25/2022   Chronic pain syndrome 04/25/2022   Candida infection, oral 03/29/2022   Gastric bypass status for obesity 12/18/2021   Multiple gallstones 12/18/2021   Class 2 severe obesity due to excess calories with serious comorbidity and body mass index (BMI) of 35.0 to 35.9 in adult 12/16/2021   Erectile dysfunction due to arterial insufficiency 12/14/2021   Encounter for general adult medical examination with abnormal findings 12/12/2021   Generalized excess and redundant skin after bariatric surgery 06/16/2021   Class 1 obesity due to excess calories with serious comorbidity and body  mass index (BMI) of 32.0 to 32.9 in adult 06/15/2021   Benign prostatic hyperplasia without lower urinary tract symptoms 04/22/2019   Gastroesophageal reflux disease without esophagitis 04/19/2018   Thiamine deficiency 04/14/2018   Other cirrhosis of liver 03/13/2018   NASH (nonalcoholic steatohepatitis) 03/13/2018   Low libido 01/28/2018   Hyperlipidemia LDL goal <130 10/25/2017   Insomnia secondary to chronic pain 06/19/2017    Osteoarthritis of left knee 04/26/2017   Degenerative spondylolisthesis 02/24/2016   Routine general medical examination at a health care facility 06/08/2015   Cough variant asthma 02/06/2015   Zinc deficiency 04/12/2014   B12 deficiency anemia 04/08/2014   Vitamin D deficiency 07/26/2012   History of gastric bypass 07/25/2012   Anemia, iron deficiency 07/25/2012   Status post bariatric surgery 07/25/2012   NARCOTIC ABUSE 03/25/2009   EXOGENOUS OBESITY 08/22/2007   Osteoarthritis of right knee 03/12/2007   Essential hypertension 10/08/2006   BACK PAIN, LUMBAR 10/08/2006    REFERRING PROVIDER:  Hiram Gash, MD    REFERRING DIAG: ORIF Humerus, Distal Humerus, Ulnar Nerve Neurolysis 04/17/22   THERAPY DIAG:  No diagnosis found.  Rationale for Evaluation and Treatment: Rehabilitation  ONSET DATE: DOS 04/17/22  SUBJECTIVE:                                                                                                                                                                                      SUBJECTIVE STATEMENT: Pt is 7 weeks s/p Right distal humerus ORIF, humeral shaft ORIF, and ulnar nerve neurolysis with anterior transposition  Pt denies any adverse effects after prior Rx.  Pt reports compliance with HEP.  Pt is taking his pain meds.  Pt saw Dr. Griffin Basil and pt states he thought everything was looking good.  Pt states he received clearance to go the gym from MD with a 5# lifting restriction.  Pt states MD doesn't want him to push against resistance with exercises in the gym and also not to push up with transfers.  Pt also received an injection in his R shoulder which improved his shoulder sx's.  He has a hx of getting shoulder injections.   Pt states he is doing much better than initially.  He wasn't able to move his fingers well after the accident, but now has good mobility with his fingers.  He has been ambulating with SPC for about a week and uses the W/C if he is out  for awhile or has to walk a long distance.    EVAL: Have been going to pain clinic for about 15 years for back pain. 4 more weeks in cervical collar- Dr Trenton Gammon treating spine. Pt reports  fx 9 vertebrae in back, 2 places in neck, 11 ribs, Rt scapula.  Rt-handed   PERTINENT HISTORY: -Pt states MD doesn't want him to perform exercises pushing against resistance and also not to push up with R UE with transfers. -Pt reports having multiple fractures in back, 2 fractures in cervical, and also 11 rib fractures  Known Rt RC tear, pt reports frequent dislocations 2 prior Lumbar fusions with last fusion being 01/23 Chronic back and R shoulder pain R TKR   PAIN:  Are you having pain? Yes: NPRS scale: 5/10 Pain location: R humerus Pain description: dull Aggravating factors: too much movement Relieving factors: pain meds  PRECAUTIONS: None Per op note: Patient is at high risk for implant failure, nonunion, infection as well as stiffness and heterotopic bone formation secondary to the level comminution.   WEIGHT BEARING RESTRICTIONS: 2lb for 2 wks and then WBAT;  Pt states MD doesn't want him to perform exercises pushing against resistance and also not to push up with R UE with transfers.  Pt reports MD informed him to not lift > 5 lbs in the gym.  FALLS:  Has patient fallen in last 6 months? Yes. Number of falls 1  LIVING ENVIRONMENT: Lives with: lives with their spouse 8 dogs, grand kids on weekend (6 of them)  OCCUPATION: retired  PLOF: Independent  PATIENT GOALS: daily gym workout, independent in ADLs  OBJECTIVE:   PATIENT SURVEYS:  FOTO 25  COGNITION: Overall cognitive status: Within functional limits for tasks assessed     SENSATION: [facility]  UPPER EXTREMITY ROM:   A/P ROM Right eval Rt 3/25  Shoulder flexion    Shoulder extension    Shoulder abduction    Shoulder adduction    Shoulder internal rotation    Shoulder external rotation    Elbow flexion 90 95  Elbow  extension -34 -20  Wrist flexion 60   Wrist extension 50 p!   Wrist pronation WFL   Wrist supination -45 -30  (Blank rows = not tested)  UPPER EXTREMITY MMT:  MMT Right eval Left eval  Shoulder flexion    Shoulder extension    Shoulder abduction    Shoulder adduction    Shoulder internal rotation    Shoulder external rotation    Middle trapezius    Lower trapezius    Elbow flexion    Elbow extension    Wrist flexion    Wrist extension    Wrist ulnar deviation    Wrist radial deviation    Wrist pronation    Wrist supination    Grip strength (lbs) 20 25  30  60 50 60  (Blank rows = not tested)   PALPATION:  EVAL: stretchy end feel   TODAY'S TREATMENT:  06/05/2022:  Reviewed pt presentation, pain level, and response to prior Rx. Pt performed: Pt performed:  Elbow flexion/extension AROM x 15 reps  Pronation/supination approx 20 reps AROM and aprox 20 reps with red Therabar  Wrist flexion and extension AROM 2x10   Seated static elbow extension stretch with towel under arm x 2 mins  Pt received R elbow flexion and extension PROM seated, wrist flex/ext, radial and ulnar deviation PROM seated with arm supported on edge of table, and forearm pronation and supination PROM with arms supported w/n pt and tissue tolerance.   Treatment                            05/29/22:  MANUAL STM biceps, wrist flexor & extensor group, scar mobs, joint mobs at end range at elbow and radius with supination Eccentric biceps curls Pronation/supination holding red therabar at the end Therabar bending- pronation & supination Resisted triceps ext yellow tband   Treatment                            05/22/22:  MANUAL mob and stretching to elbow Wrist pronation, supination, flexion, ext Elbow AROM putty   PATIENT EDUCATION: Education details: Anatomy of  condition, POC, HEP, exercise form/rationale Person educated: Patient Education method: Explanation, Demonstration, Tactile cues, Verbal cues, and Handouts Education comprehension: verbalized understanding, returned demonstration, verbal cues required, tactile cues required, and needs further education  HOME EXERCISE PROGRAM: [id number]  ASSESSMENT:  CLINICAL IMPRESSION: Pt is improving with elbow and forearm ROM.  He continues to be limited with supination and elbow extension ROM which improved with exercises and PROM.  He performed exercises well with cuing for correct form and tolerated exercises well.  He saw MD and reports MD informed him to not perform pushing exercises in the gym and to not perform resistance > 5 lbs.  He was also instructed to not push up with R UE with transfers.  Pt tolerated PROM well and responded well to Rx having no increased pain after exercises.  He should benefit from cont skilled PT services to address ongoing goals and improve ROM and function.    OBJECTIVE IMPAIRMENTS: Abnormal gait, decreased activity tolerance, decreased balance, decreased endurance, decreased mobility, difficulty walking, decreased ROM, decreased strength, hypomobility, increased edema, increased muscle spasms, impaired flexibility, impaired UE functional use, improper body mechanics, postural dysfunction, and pain.   ACTIVITY LIMITATIONS: carrying, lifting, bending, standing, squatting, sleeping, stairs, transfers, bed mobility, bathing, dressing, reach over head, hygiene/grooming, locomotion level, and caring for others  PARTICIPATION LIMITATIONS: meal prep, cleaning, laundry, driving, shopping, community activity, and yard work  PERSONAL FACTORS:  see history  are also affecting patient's functional outcome.   REHAB POTENTIAL: Good  CLINICAL DECISION MAKING: Evolving/moderate complexity  EVALUATION COMPLEXITY: Moderate   GOALS: Goals reviewed with patient? Yes  SHORT TERM GOALS:  Target date: 3/29  Elbow extension to 0 deg Baseline: Goal status: INITIAL    LONG TERM GOALS: Target date: POC date  Meet FOTO goal Baseline:  Goal status: INITIAL  2.  Independent in self-care and dressing without limitation by elbow ROM or pain Baseline:  Goal status: INITIAL  3.  Grip strength within 5 lb of opposite UE Baseline:  Goal status: INITIAL  4.  Able to lift/carry a plate of food without limitation by elbow Baseline:  Goal status: INITIAL   PLAN:  PT FREQUENCY: 1-2x/week  PT DURATION:  8 weeks  PLANNED INTERVENTIONS: Therapeutic exercises, Therapeutic activity, Neuromuscular re-education, Patient/Family education, Self Care, Joint mobilization, Aquatic Therapy, Dry Needling, Electrical stimulation, Spinal mobilization, Cryotherapy, Moist heat, scar mobilization, Taping, Vasopneumatic device, Traction, Ultrasound, Ionotophoresis 4mg /ml Dexamethasone, Manual therapy, and Re-evaluation  PLAN FOR NEXT SESSION: continue with ROM, ther ex, and manual therapy.    Rudi Heap PT, DPT 06/06/22  9:51 AM

## 2022-06-08 ENCOUNTER — Ambulatory Visit (HOSPITAL_BASED_OUTPATIENT_CLINIC_OR_DEPARTMENT_OTHER): Payer: Medicare Other

## 2022-06-08 ENCOUNTER — Encounter (HOSPITAL_BASED_OUTPATIENT_CLINIC_OR_DEPARTMENT_OTHER): Payer: Self-pay

## 2022-06-08 ENCOUNTER — Other Ambulatory Visit: Payer: Self-pay | Admitting: Internal Medicine

## 2022-06-08 DIAGNOSIS — R293 Abnormal posture: Secondary | ICD-10-CM | POA: Diagnosis not present

## 2022-06-08 DIAGNOSIS — M6281 Muscle weakness (generalized): Secondary | ICD-10-CM | POA: Diagnosis not present

## 2022-06-08 DIAGNOSIS — I10 Essential (primary) hypertension: Secondary | ICD-10-CM

## 2022-06-08 DIAGNOSIS — M25521 Pain in right elbow: Secondary | ICD-10-CM

## 2022-06-08 DIAGNOSIS — M25621 Stiffness of right elbow, not elsewhere classified: Secondary | ICD-10-CM | POA: Diagnosis not present

## 2022-06-08 NOTE — Therapy (Signed)
OUTPATIENT PHYSICAL THERAPY SHOULDER TREATMENT   Patient Name: Bradley [name] MRN: [id number] DOB:[birth year], 52 y.o., male Today's Date: 06/08/2022  END OF SESSION:  PT End of Session - 06/08/22 1457     Visit Number 4    Number of Visits 17    Date for PT Re-Evaluation 07/14/22    Authorization Type [employer] MCR    PT Start Time [id number]    PT Stop Time [id number]    PT Time Calculation (min) 42 min    Activity Tolerance Patient tolerated treatment well    Behavior During Therapy WFL for tasks assessed/performed               Past Medical History:  Diagnosis Date   Allergy    Anxiety    Aortic valve sclerosis    Arthritis    Arthropathy, unspecified, other specified sites    CAD (coronary artery disease)    01/09/20: patient denied; faint coronary calcifications on 2016 CT   Depression    GERD (gastroesophageal reflux disease)    Heart murmur    mild MR, mild AS 06/2019 (Dr. [name])   History of kidney stones    Hypertension     " no longer have hypertension since gastric bypass "   Obesity, unspecified    Other, mixed, or unspecified nondependent drug abuse, unspecified    Personal history of urinary calculi    Priapism    Past Surgical History:  Procedure Laterality Date   ANKLE SURGERY Left 2003   ANTERIOR LAT LUMBAR FUSION Left 02/24/2016   Procedure: LEFT LUMBAR TWO-THREE  ANTERIOR LATERAL LUMBAR FUSION  ;  Surgeon: Earnie Larsson, MD;  Location: [facility];  Service: Neurosurgery;  Laterality: Left;   ANTERIOR LATERAL LUMBAR FUSION WITH PERCUTANEOUS SCREW 1 LEVEL Right 01/13/2020   Procedure: RIGHT LUMBAR ONE-TWO ANTERIOR LATERAL LUMBAR FUSION WITH PERCUTANEOUS SCREW PLACEMENT;  Surgeon: Earnie Larsson, MD;  Location: [facility];  Service: Neurosurgery;  Laterality: Right;  3C   CHOLECYSTECTOMY N/A 01/16/2022   Procedure: LAPAROSCOPIC CHOLECYSTECTOMY WITH INTRAOPERATIVE CHOLANGIOGRAM AND ICG DYE;  Surgeon: Greer Pickerel, MD;  Location: [facility];  Service: General;  Laterality: N/A;    GASTRIC BYPASS  2008   HERNIA REPAIR Left 2005   groin   INCISION AND DRAINAGE ABSCESS Right 07/06/2004   great toe   IRRIGATION AND DEBRIDEMENT ABSCESS Right 07/08/2004   great toe   KNEE ARTHROPLASTY Right 04/26/2017   Procedure: RIGHT TOTAL KNEE ARTHROPLASTY WITH COMPUTER NAVIGATION;  Surgeon: Rod Can, MD;  Location: [facility];  Service: Orthopedics;  Laterality: Right;  Needs RNFA   KNEE SURGERY Right 2004   LUMBAR PERCUTANEOUS PEDICLE SCREW 1 LEVEL Left 02/24/2016   Procedure: LUMBAR PERCUTANEOUS PEDICLE SCREW 1 LEVEL;  Surgeon: Earnie Larsson, MD;  Location: [facility];  Service: Neurosurgery;  Laterality: Left;   MICRODISCECTOMY LUMBAR Left 08/24/2008   T12 - L1   ORIF HUMERUS FRACTURE Right 04/17/2022   Procedure: OPEN REDUCTION INTERNAL FIXATION (ORIF) RIGHT DISTAL HUMERUS FRACTURE;  Surgeon: Hiram Gash, MD;  Location: [facility];  Service: Orthopedics;  Laterality: Right;   panectomy  2010   TOTAL KNEE ARTHROPLASTY Right 04/2017   Patient Active Problem List   Diagnosis Date Noted   Pain and swelling of left lower leg 05/11/2022   Deep venous thrombosis 05/11/2022   Lumbar muscle hematoma 05/02/2022   Humerus shaft fracture--right 05/02/2022   Constipation [id number]   Uncomplicated opioid dependence 04/25/2022   Chronic pain syndrome 04/25/2022  Candida infection, oral 03/29/2022   Gastric bypass status for obesity 12/18/2021   Multiple gallstones 12/18/2021   Class 2 severe obesity due to excess calories with serious comorbidity and body mass index (BMI) of 35.0 to 35.9 in adult 12/16/2021   Erectile dysfunction due to arterial insufficiency 12/14/2021   Encounter for general adult medical examination with abnormal findings 12/12/2021   Generalized excess and redundant skin after bariatric surgery 06/16/2021   Class 1 obesity due to excess calories with serious comorbidity and body mass index (BMI) of 32.0 to 32.9 in adult 06/15/2021   Benign prostatic hyperplasia without  lower urinary tract symptoms 04/22/2019   Gastroesophageal reflux disease without esophagitis 04/19/2018   Thiamine deficiency 04/14/2018   Other cirrhosis of liver 03/13/2018   NASH (nonalcoholic steatohepatitis) 03/13/2018   Low libido 01/28/2018   Hyperlipidemia LDL goal <130 10/25/2017   Insomnia secondary to chronic pain 06/19/2017   Osteoarthritis of left knee 04/26/2017   Degenerative spondylolisthesis 02/24/2016   Routine general medical examination at a health care facility 06/08/2015   Cough variant asthma 02/06/2015   Zinc deficiency 04/12/2014   B12 deficiency anemia 04/08/2014   Vitamin D deficiency 07/26/2012   History of gastric bypass 07/25/2012   Anemia, iron deficiency 07/25/2012   Status post bariatric surgery 07/25/2012   NARCOTIC ABUSE 03/25/2009   EXOGENOUS OBESITY 08/22/2007   Osteoarthritis of right knee 03/12/2007   Essential hypertension 10/08/2006   BACK PAIN, LUMBAR 10/08/2006    REFERRING PROVIDER:  Hiram Gash, MD    REFERRING DIAG: ORIF Humerus, Distal Humerus, Ulnar Nerve Neurolysis 04/17/22   THERAPY DIAG:  Pain in right elbow  Stiffness of right elbow, not elsewhere classified  Muscle weakness (generalized)  Abnormal posture  Rationale for Evaluation and Treatment: Rehabilitation  ONSET DATE: DOS 04/17/22  SUBJECTIVE:                                                                                                                                                                                      SUBJECTIVE STATEMENT: Pt is 7.5 weeks s/p. He reports 5/10 pain level on average. "It's really gotten better." Did have some anterior arm soreness after last session.   Pt denies any adverse effects after prior Rx.  Pt reports compliance with HEP.  Pt is taking his pain meds.  Pt saw Dr. Griffin Basil and pt states he thought everything was looking good.  Pt states he received clearance to go the gym from MD with a 5# lifting restriction.  Pt states  MD doesn't want him to push against resistance with exercises in the gym and also not to push up with transfers.  Pt also received an injection in his R shoulder which improved his shoulder sx's.  He has a hx of getting shoulder injections.   Pt states he is doing much better than initially.  He wasn't able to move his fingers well after the accident, but now has good mobility with his fingers.  He has been ambulating with SPC for about a week and uses the W/C if he is out for awhile or has to walk a long distance.    EVAL: Have been going to pain clinic for about 15 years for back pain. 4 more weeks in cervical collar- Dr Trenton Gammon treating spine. Pt reports fx 9 vertebrae in back, 2 places in neck, 11 ribs, Rt scapula.  Rt-handed   PERTINENT HISTORY: -Pt states MD doesn't want him to perform exercises pushing against resistance and also not to push up with R UE with transfers. -Pt reports having multiple fractures in back, 2 fractures in cervical, and also 11 rib fractures  Known Rt RC tear, pt reports frequent dislocations 2 prior Lumbar fusions with last fusion being 01/23 Chronic back and R shoulder pain R TKR   PAIN:  Are you having pain? Yes: NPRS scale: 5/10 Pain location: R humerus Pain description: dull Aggravating factors: too much movement Relieving factors: pain meds  PRECAUTIONS: None Per op note: Patient is at high risk for implant failure, nonunion, infection as well as stiffness and heterotopic bone formation secondary to the level comminution.   WEIGHT BEARING RESTRICTIONS: 2lb for 2 wks and then WBAT;  Pt states MD doesn't want him to perform exercises pushing against resistance and also not to push up with R UE with transfers.  Pt reports MD informed him to not lift > 5 lbs in the gym.  FALLS:  Has patient fallen in last 6 months? Yes. Number of falls 1  LIVING ENVIRONMENT: Lives with: lives with their spouse 8 dogs, grand kids on weekend (6 of  them)  OCCUPATION: retired  PLOF: Independent  PATIENT GOALS: daily gym workout, independent in ADLs  OBJECTIVE:   PATIENT SURVEYS:  FOTO 25  COGNITION: Overall cognitive status: Within functional limits for tasks assessed     SENSATION: [facility]  UPPER EXTREMITY ROM:   A/P ROM Right eval Rt 3/25  Shoulder flexion    Shoulder extension    Shoulder abduction    Shoulder adduction    Shoulder internal rotation    Shoulder external rotation    Elbow flexion 90 95  Elbow extension -34 -20  Wrist flexion 60   Wrist extension 50 p!   Wrist pronation WFL   Wrist supination -45 -30  (Blank rows = not tested)  UPPER EXTREMITY MMT:  MMT Right eval Left eval  Shoulder flexion    Shoulder extension    Shoulder abduction    Shoulder adduction    Shoulder internal rotation    Shoulder external rotation    Middle trapezius    Lower trapezius    Elbow flexion    Elbow extension    Wrist flexion    Wrist extension    Wrist ulnar deviation    Wrist radial deviation    Wrist pronation    Wrist supination    Grip strength (lbs) 20 25  30  60 50 60  (Blank rows = not tested)   PALPATION:  EVAL: stretchy end feel   TODAY'S TREATMENT:  Treatment                            06/08/22:  PROM elbow flexion/ext, supination/pronation, end range stretch Wrist flexion/ext AROM 2x10 biceps curls 3x10 Pronation/supination 2x10ea Therabar bending- pronation & supination Scapular squeezes 2x10 (gentle) Seated shoulder flexion x10 (within available range)  06/05/2022:  Reviewed pt presentation, pain level, and response to prior Rx. Pt performed: Pt performed:  Elbow flexion/extension AROM x 15 reps  Pronation/supination approx 20 reps AROM and aprox 20 reps with red Therabar  Wrist flexion and extension AROM 2x10   Seated static elbow  extension stretch with towel under arm x 2 mins  Pt received R elbow flexion and extension PROM seated, wrist flex/ext, radial and ulnar deviation PROM seated with arm supported on edge of table, and forearm pronation and supination PROM with arms supported w/n pt and tissue tolerance.   Treatment                            05/29/22:  MANUAL STM biceps, wrist flexor & extensor group, scar mobs, joint mobs at end range at elbow and radius with supination Eccentric biceps curls Pronation/supination holding red therabar at the end Therabar bending- pronation & supination Resisted triceps ext yellow tband   Treatment                            05/22/22:  MANUAL mob and stretching to elbow Wrist pronation, supination, flexion, ext Elbow AROM putty   PATIENT EDUCATION: Education details: Anatomy of condition, POC, HEP, exercise form/rationale Person educated: Patient Education method: Explanation, Demonstration, Tactile cues, Verbal cues, and Handouts Education comprehension: verbalized understanding, returned demonstration, verbal cues required, tactile cues required, and needs further education  HOME EXERCISE PROGRAM: [id number]  ASSESSMENT:  CLINICAL IMPRESSION: Pt continues to improve with elbow ROM. Has difficulty with GHJ elevation due to RTC pathology. Also requires modifications with hand position during therabar activity due to [facility] joint pain. No significant pan noted with exercises today, though he does have some discomfort in elbow and forearm with terminal supination.  Pt is hoping to go to [place] for a family trip in early May. Will continue to progress a tolerated.   OBJECTIVE IMPAIRMENTS: Abnormal gait, decreased activity tolerance, decreased balance, decreased endurance, decreased mobility, difficulty walking, decreased ROM, decreased strength, hypomobility, increased edema, increased muscle spasms, impaired flexibility, impaired UE functional use, improper body mechanics,  postural dysfunction, and pain.   ACTIVITY LIMITATIONS: carrying, lifting, bending, standing, squatting, sleeping, stairs, transfers, bed mobility, bathing, dressing, reach over head, hygiene/grooming, locomotion level, and caring for others  PARTICIPATION LIMITATIONS: meal prep, cleaning, laundry, driving, shopping, community activity, and yard work  PERSONAL FACTORS:  see history  are also affecting patient's functional outcome.   REHAB POTENTIAL: Good  CLINICAL DECISION MAKING: Evolving/moderate complexity  EVALUATION COMPLEXITY: Moderate   GOALS: Goals reviewed with patient? Yes  SHORT TERM GOALS: Target date: 3/29  Elbow extension to 0 deg Baseline: Goal status: INITIAL    LONG TERM GOALS: Target date: POC date  Meet FOTO goal Baseline:  Goal status: INITIAL  2.  Independent in self-care and dressing without limitation by elbow ROM or pain Baseline:  Goal status: INITIAL  3.  Grip strength within 5 lb of opposite UE Baseline:  Goal status: INITIAL  4.  Able to lift/carry a plate of  food without limitation by elbow Baseline:  Goal status: INITIAL   PLAN:  PT FREQUENCY: 1-2x/week  PT DURATION: 8 weeks  PLANNED INTERVENTIONS: Therapeutic exercises, Therapeutic activity, Neuromuscular re-education, Patient/Family education, Self Care, Joint mobilization, Aquatic Therapy, Dry Needling, Electrical stimulation, Spinal mobilization, Cryotherapy, Moist heat, scar mobilization, Taping, Vasopneumatic device, Traction, Ultrasound, Ionotophoresis 4mg /ml Dexamethasone, Manual therapy, and Re-evaluation  PLAN FOR NEXT SESSION: continue with ROM, ther ex, and manual therapy.    Sherlynn Carbon, PTA  06/08/22 3:10 PM

## 2022-06-12 ENCOUNTER — Other Ambulatory Visit: Payer: Self-pay | Admitting: Internal Medicine

## 2022-06-12 MED ORDER — ZEPBOUND 5 MG/0.5ML ~~LOC~~ SOAJ: 5.0000 mg | SUBCUTANEOUS | 0 refills | Status: DC

## 2022-06-12 NOTE — Telephone Encounter (Signed)
Pt want to know " Do i need to up my dosage? "

## 2022-06-13 ENCOUNTER — Ambulatory Visit (HOSPITAL_BASED_OUTPATIENT_CLINIC_OR_DEPARTMENT_OTHER): Payer: Medicare Other

## 2022-06-14 DIAGNOSIS — S12601A Unspecified nondisplaced fracture of seventh cervical vertebra, initial encounter for closed fracture: Secondary | ICD-10-CM | POA: Diagnosis not present

## 2022-06-16 ENCOUNTER — Encounter (HOSPITAL_BASED_OUTPATIENT_CLINIC_OR_DEPARTMENT_OTHER): Payer: Self-pay | Admitting: Physical Therapy

## 2022-06-16 ENCOUNTER — Ambulatory Visit (HOSPITAL_BASED_OUTPATIENT_CLINIC_OR_DEPARTMENT_OTHER): Payer: Medicare Other | Admitting: Physical Therapy

## 2022-06-16 DIAGNOSIS — M6281 Muscle weakness (generalized): Secondary | ICD-10-CM

## 2022-06-16 DIAGNOSIS — R293 Abnormal posture: Secondary | ICD-10-CM | POA: Diagnosis not present

## 2022-06-16 DIAGNOSIS — M25621 Stiffness of right elbow, not elsewhere classified: Secondary | ICD-10-CM

## 2022-06-16 DIAGNOSIS — M25521 Pain in right elbow: Secondary | ICD-10-CM | POA: Diagnosis not present

## 2022-06-16 NOTE — Therapy (Signed)
OUTPATIENT PHYSICAL THERAPY SHOULDER TREATMENT   Patient Name: Bradley [name] MRN: [id number] DOB:05-Jun-1970, 52 y.o., male Today's Date: 06/16/2022  END OF SESSION:  PT End of Session - 06/16/22 1235     Visit Number 5    Number of Visits 17    Date for PT Re-Evaluation 07/14/22    Authorization Type [employer] MCR    PT Start Time 1232    PT Stop Time 1305    PT Time Calculation (min) 33 min    Activity Tolerance Patient tolerated treatment well    Behavior During Therapy WFL for tasks assessed/performed               Past Medical History:  Diagnosis Date   Allergy    Anxiety    Aortic valve sclerosis    Arthritis    Arthropathy, unspecified, other specified sites    CAD (coronary artery disease)    01/09/20: patient denied; faint coronary calcifications on 2016 CT   Depression    GERD (gastroesophageal reflux disease)    Heart murmur    mild MR, mild AS 06/2019 (Dr. Dietrich Pates)   History of kidney stones    Hypertension     " no longer have hypertension since gastric bypass "   Obesity, unspecified    Other, mixed, or unspecified nondependent drug abuse, unspecified    Personal history of urinary calculi    Priapism    Past Surgical History:  Procedure Laterality Date   ANKLE SURGERY Left 2003   ANTERIOR LAT LUMBAR FUSION Left 02/24/2016   Procedure: LEFT LUMBAR TWO-THREE  ANTERIOR LATERAL LUMBAR FUSION  ;  Surgeon: Julio Sicks, MD;  Location: [facility] OR;  Service: Neurosurgery;  Laterality: Left;   ANTERIOR LATERAL LUMBAR FUSION WITH PERCUTANEOUS SCREW 1 LEVEL Right 01/13/2020   Procedure: RIGHT LUMBAR ONE-TWO ANTERIOR LATERAL LUMBAR FUSION WITH PERCUTANEOUS SCREW PLACEMENT;  Surgeon: Julio Sicks, MD;  Location: [facility] OR;  Service: Neurosurgery;  Laterality: Right;  3C   CHOLECYSTECTOMY N/A 01/16/2022   Procedure: LAPAROSCOPIC CHOLECYSTECTOMY WITH INTRAOPERATIVE CHOLANGIOGRAM AND ICG DYE;  Surgeon: Gaynelle Adu, MD;  Location: [facility] ORS;  Service: General;  Laterality: N/A;    GASTRIC BYPASS  2008   HERNIA REPAIR Left 2005   groin   INCISION AND DRAINAGE ABSCESS Right 07/06/2004   great toe   IRRIGATION AND DEBRIDEMENT ABSCESS Right 07/08/2004   great toe   KNEE ARTHROPLASTY Right 04/26/2017   Procedure: RIGHT TOTAL KNEE ARTHROPLASTY WITH COMPUTER NAVIGATION;  Surgeon: Samson Frederic, MD;  Location: [facility] ORS;  Service: Orthopedics;  Laterality: Right;  Needs RNFA   KNEE SURGERY Right 2004   LUMBAR PERCUTANEOUS PEDICLE SCREW 1 LEVEL Left 02/24/2016   Procedure: LUMBAR PERCUTANEOUS PEDICLE SCREW 1 LEVEL;  Surgeon: Julio Sicks, MD;  Location: [facility] OR;  Service: Neurosurgery;  Laterality: Left;   MICRODISCECTOMY LUMBAR Left 08/24/2008   T12 - L1   ORIF HUMERUS FRACTURE Right 04/17/2022   Procedure: OPEN REDUCTION INTERNAL FIXATION (ORIF) RIGHT DISTAL HUMERUS FRACTURE;  Surgeon: Bjorn Pippin, MD;  Location: [facility] OR;  Service: Orthopedics;  Laterality: Right;   panectomy  2010   TOTAL KNEE ARTHROPLASTY Right 04/2017   Patient Active Problem List   Diagnosis Date Noted   Pain and swelling of left lower leg 05/11/2022   Deep venous thrombosis 05/11/2022   Lumbar muscle hematoma 05/02/2022   Humerus shaft fracture--right 05/02/2022   Constipation 05/02/2022   Uncomplicated opioid dependence 04/25/2022   Chronic pain syndrome 04/25/2022  Candida infection, oral 03/29/2022   Gastric bypass status for obesity 12/18/2021   Multiple gallstones 12/18/2021   Class 2 severe obesity due to excess calories with serious comorbidity and body mass index (BMI) of 35.0 to 35.9 in adult 12/16/2021   Erectile dysfunction due to arterial insufficiency 12/14/2021   Encounter for general adult medical examination with abnormal findings 12/12/2021   Generalized excess and redundant skin after bariatric surgery 06/16/2021   Class 1 obesity due to excess calories with serious comorbidity and body mass index (BMI) of 32.0 to 32.9 in adult 06/15/2021   Benign prostatic hyperplasia without  lower urinary tract symptoms 04/22/2019   Gastroesophageal reflux disease without esophagitis 04/19/2018   Thiamine deficiency 04/14/2018   Other cirrhosis of liver 03/13/2018   NASH (nonalcoholic steatohepatitis) 03/13/2018   Low libido 01/28/2018   Hyperlipidemia LDL goal <130 10/25/2017   Insomnia secondary to chronic pain 06/19/2017   Osteoarthritis of left knee 04/26/2017   Degenerative spondylolisthesis 02/24/2016   Routine general medical examination at a health care facility 06/08/2015   Cough variant asthma 02/06/2015   Zinc deficiency 04/12/2014   B12 deficiency anemia 04/08/2014   Vitamin D deficiency 07/26/2012   History of gastric bypass 07/25/2012   Anemia, iron deficiency 07/25/2012   Status post bariatric surgery 07/25/2012   NARCOTIC ABUSE 03/25/2009   EXOGENOUS OBESITY 08/22/2007   Osteoarthritis of right knee 03/12/2007   Essential hypertension 10/08/2006   BACK PAIN, LUMBAR 10/08/2006    REFERRING PROVIDER:  Hiram Gash, MD    REFERRING DIAG: ORIF Humerus, Distal Humerus, Ulnar Nerve Neurolysis 04/17/22   THERAPY DIAG:  Pain in right elbow  Stiffness of right elbow, not elsewhere classified  Muscle weakness (generalized)  Abnormal posture  Rationale for Evaluation and Treatment: Rehabilitation  ONSET DATE: DOS 04/17/22  SUBJECTIVE:                                                                                                                                                                                      SUBJECTIVE STATEMENT: Pt is 7.5 weeks s/p. He reports 5/10 pain level on average. "It's really gotten better." Did have some anterior arm soreness after last session.   Pt denies any adverse effects after prior Rx.  Pt reports compliance with HEP.  Pt is taking his pain meds.  Pt saw Dr. Griffin Basil and pt states he thought everything was looking good.  Pt states he received clearance to go the gym from MD with a 5# lifting restriction.  Pt states  MD doesn't want him to push against resistance with exercises in the gym and also not to push up with transfers.  Pt also received an injection in his R shoulder which improved his shoulder sx's.  He has a hx of getting shoulder injections.   Pt states he is doing much better than initially.  He wasn't able to move his fingers well after the accident, but now has good mobility with his fingers.  He has been ambulating with SPC for about a week and uses the W/C if he is out for awhile or has to walk a long distance.    EVAL: Have been going to pain clinic for about 15 years for back pain. 4 more weeks in cervical collar- Dr Dutch Quint treating spine. Pt reports fx 9 vertebrae in back, 2 places in neck, 11 ribs, Rt scapula.  Rt-handed   PERTINENT HISTORY: -Pt states MD doesn't want him to perform exercises pushing against resistance and also not to push up with R UE with transfers. -Pt reports having multiple fractures in back, 2 fractures in cervical, and also 11 rib fractures  Known Rt RC tear, pt reports frequent dislocations 2 prior Lumbar fusions with last fusion being 01/23 Chronic back and R shoulder pain R TKR   PAIN:  Are you having pain? Yes: NPRS scale: 5/10 Pain location: R humerus Pain description: dull Aggravating factors: too much movement Relieving factors: pain meds  PRECAUTIONS: None Per op note: Patient is at high risk for implant failure, nonunion, infection as well as stiffness and heterotopic bone formation secondary to the level comminution.  Pt reports 8lb lifting limit from Dr Jordan Likes on 06/16/22  WEIGHT BEARING RESTRICTIONS: 2lb for 2 wks and then WBAT;  Pt states MD doesn't want him to perform exercises pushing against resistance and also not to push up with R UE with transfers.  Pt reports MD informed him to not lift > 5 lbs in the gym.  FALLS:  Has patient fallen in last 6 months? Yes. Number of falls 1  LIVING ENVIRONMENT: Lives with: lives with their spouse 8  dogs, grand kids on weekend (6 of them)  OCCUPATION: retired  PLOF: Independent  PATIENT GOALS: daily gym workout, independent in ADLs  OBJECTIVE:   PATIENT SURVEYS:  FOTO 25  COGNITION: Overall cognitive status: Within functional limits for tasks assessed     SENSATION: [facility]  UPPER EXTREMITY ROM:   A/P ROM Right eval Rt 3/25  Shoulder flexion    Shoulder extension    Shoulder abduction    Shoulder adduction    Shoulder internal rotation    Shoulder external rotation    Elbow flexion 90 95  Elbow extension -34 -20  Wrist flexion 60   Wrist extension 50 p!   Wrist pronation WFL   Wrist supination -45 -30  (Blank rows = not tested)  UPPER EXTREMITY MMT:  MMT Right eval Left eval  Shoulder flexion    Shoulder extension    Shoulder abduction    Shoulder adduction    Shoulder internal rotation    Shoulder external rotation    Middle trapezius    Lower trapezius    Elbow flexion    Elbow extension    Wrist flexion    Wrist extension    Wrist ulnar deviation    Wrist radial deviation    Wrist pronation    Wrist supination    Grip strength (lbs) 60 50 60  (Blank rows = not tested)   PALPATION:  EVAL: stretchy end feel   TODAY'S TREATMENT:  Treatment                            4/12:  MANUAL: IASTM incision sites, radial mobs into supination, end range ext mobs, distraction; end range flexion mobilization & scar mobilization Resisted elbow flexion and extension- in supination, in pronation Forward serve in supination, bil UE moving- cues required to avoid upper trap overuse Forward reach to just above 90 deg- limited by shoulder pain   Treatment                            06/08/22:  PROM elbow flexion/ext, supination/pronation, end range stretch Wrist flexion/ext AROM 2x10 biceps curls  3x10 Pronation/supination 2x10ea Therabar bending- pronation & supination Scapular squeezes 2x10 (gentle) Seated shoulder flexion x10 (within available range)  06/05/2022:  Reviewed pt presentation, pain level, and response to prior Rx. Pt performed: Pt performed:  Elbow flexion/extension AROM x 15 reps  Pronation/supination approx 20 reps AROM and aprox 20 reps with red Therabar  Wrist flexion and extension AROM 2x10   Seated static elbow extension stretch with towel under arm x 2 mins  Pt received R elbow flexion and extension PROM seated, wrist flex/ext, radial and ulnar deviation PROM seated with arm supported on edge of table, and forearm pronation and supination PROM with arms supported w/n pt and tissue tolerance.    PATIENT EDUCATION: Education details: Teacher, music of condition, POC, HEP, exercise form/rationale Person educated: Patient Education method: Explanation, Demonstration, Tactile cues, Verbal cues, and Handouts Education comprehension: verbalized understanding, returned demonstration, verbal cues required, tactile cues required, and needs further education  HOME EXERCISE PROGRAM: [id number]  ASSESSMENT:  CLINICAL IMPRESSION: Elbow range of motion continues to improve.  At this point we are still limited by an 8 pound lifting restriction as cervical fractures are still healing.  Encouraged him to avoid excessive overhead work but we do want to maintain range of motion as able.  Limited by shoulder pain due to known rotator cuff tearing.  Will continue to focus on functional use of hand wrist and elbow.  OBJECTIVE IMPAIRMENTS: Abnormal gait, decreased activity tolerance, decreased balance, decreased endurance, decreased mobility, difficulty walking, decreased ROM, decreased strength, hypomobility, increased edema, increased muscle spasms, impaired flexibility, impaired UE functional use, improper body mechanics, postural dysfunction, and pain.   ACTIVITY LIMITATIONS:  carrying, lifting, bending, standing, squatting, sleeping, stairs, transfers, bed mobility, bathing, dressing, reach over head, hygiene/grooming, locomotion level, and caring for others  PARTICIPATION LIMITATIONS: meal prep, cleaning, laundry, driving, shopping, community activity, and yard work  PERSONAL FACTORS:  see history  are also affecting patient's functional outcome.   REHAB POTENTIAL: Good  CLINICAL DECISION MAKING: Evolving/moderate complexity  EVALUATION COMPLEXITY: Moderate   GOALS: Goals reviewed with patient? Yes  SHORT TERM GOALS: Target date: 3/29  Elbow extension to 0 deg Baseline: -14 on 4/12, -6 following manual therapy Goal status:ongoing    LONG TERM GOALS: Target date: POC date  Meet FOTO goal Baseline:  Goal status: INITIAL  2.  Independent in self-care and dressing without limitation by elbow ROM or pain Baseline:  Goal status: INITIAL  3.  Grip strength within 5 lb of opposite UE Baseline:  Goal status: INITIAL  4.  Able to lift/carry a plate of food without limitation by elbow Baseline:  Goal status: INITIAL   PLAN:  PT FREQUENCY: 1-2x/week  PT DURATION: 8 weeks  PLANNED INTERVENTIONS: Therapeutic exercises, Therapeutic  activity, Neuromuscular re-education, Patient/Family education, Self Care, Joint mobilization, Aquatic Therapy, Dry Needling, Electrical stimulation, Spinal mobilization, Cryotherapy, Moist heat, scar mobilization, Taping, Vasopneumatic device, Traction, Ultrasound, Ionotophoresis /ml Dexamethasone, Manual therapy, and Re-evaluation  PLAN FOR NEXT SESSION: continue with ROM, ther ex, and manual therapy.    Andrej Spagnoli C. Lovene Maret PT, DPT 06/16/22 1:08 PM

## 2022-06-19 ENCOUNTER — Encounter: Payer: Self-pay | Admitting: *Deleted

## 2022-06-21 ENCOUNTER — Encounter (HOSPITAL_BASED_OUTPATIENT_CLINIC_OR_DEPARTMENT_OTHER): Payer: Self-pay | Admitting: Physical Therapy

## 2022-06-21 ENCOUNTER — Ambulatory Visit (HOSPITAL_BASED_OUTPATIENT_CLINIC_OR_DEPARTMENT_OTHER): Payer: Medicare Other | Admitting: Physical Therapy

## 2022-06-21 DIAGNOSIS — M25521 Pain in right elbow: Secondary | ICD-10-CM

## 2022-06-21 DIAGNOSIS — M25621 Stiffness of right elbow, not elsewhere classified: Secondary | ICD-10-CM | POA: Diagnosis not present

## 2022-06-21 DIAGNOSIS — R293 Abnormal posture: Secondary | ICD-10-CM | POA: Diagnosis not present

## 2022-06-21 DIAGNOSIS — M6281 Muscle weakness (generalized): Secondary | ICD-10-CM | POA: Diagnosis not present

## 2022-06-21 NOTE — Therapy (Addendum)
OUTPATIENT PHYSICAL THERAPY SHOULDER TREATMENT   Patient Name: Bradley [name] MRN: [id number] DOB:November 21, 1970, 52 y.o., male Today's Date: 06/22/2022  END OF SESSION:  PT End of Session - 06/21/22 1548     Visit Number 6    Number of Visits 17    Date for PT Re-Evaluation 07/14/22    Authorization Type [employer] MCR    PT Start Time 1542    PT Stop Time 1617    PT Time Calculation (min) 35 min    Activity Tolerance Patient tolerated treatment well    Behavior During Therapy WFL for tasks assessed/performed               Past Medical History:  Diagnosis Date   Allergy    Anxiety    Aortic valve sclerosis    Arthritis    Arthropathy, unspecified, other specified sites    CAD (coronary artery disease)    01/09/20: patient denied; faint coronary calcifications on 2016 CT   Depression    GERD (gastroesophageal reflux disease)    Heart murmur    mild MR, mild AS 06/2019 (Dr. Dietrich Pates)   History of kidney stones    Hypertension     " no longer have hypertension since gastric bypass "   Obesity, unspecified    Other, mixed, or unspecified nondependent drug abuse, unspecified    Personal history of urinary calculi    Priapism    Past Surgical History:  Procedure Laterality Date   ANKLE SURGERY Left 2003   ANTERIOR LAT LUMBAR FUSION Left 02/24/2016   Procedure: LEFT LUMBAR TWO-THREE  ANTERIOR LATERAL LUMBAR FUSION  ;  Surgeon: Julio Sicks, MD;  Location: [facility] OR;  Service: Neurosurgery;  Laterality: Left;   ANTERIOR LATERAL LUMBAR FUSION WITH PERCUTANEOUS SCREW 1 LEVEL Right 01/13/2020   Procedure: RIGHT LUMBAR ONE-TWO ANTERIOR LATERAL LUMBAR FUSION WITH PERCUTANEOUS SCREW PLACEMENT;  Surgeon: Julio Sicks, MD;  Location: [facility] OR;  Service: Neurosurgery;  Laterality: Right;  3C   CHOLECYSTECTOMY N/A 01/16/2022   Procedure: LAPAROSCOPIC CHOLECYSTECTOMY WITH INTRAOPERATIVE CHOLANGIOGRAM AND ICG DYE;  Surgeon: Gaynelle Adu, MD;  Location: [facility] ORS;  Service: General;  Laterality: N/A;    GASTRIC BYPASS  2008   HERNIA REPAIR Left 2005   groin   INCISION AND DRAINAGE ABSCESS Right 07/06/2004   great toe   IRRIGATION AND DEBRIDEMENT ABSCESS Right 07/08/2004   great toe   KNEE ARTHROPLASTY Right 04/26/2017   Procedure: RIGHT TOTAL KNEE ARTHROPLASTY WITH COMPUTER NAVIGATION;  Surgeon: Samson Frederic, MD;  Location: [facility] ORS;  Service: Orthopedics;  Laterality: Right;  Needs RNFA   KNEE SURGERY Right 2004   LUMBAR PERCUTANEOUS PEDICLE SCREW 1 LEVEL Left 02/24/2016   Procedure: LUMBAR PERCUTANEOUS PEDICLE SCREW 1 LEVEL;  Surgeon: Julio Sicks, MD;  Location: [facility] OR;  Service: Neurosurgery;  Laterality: Left;   MICRODISCECTOMY LUMBAR Left 08/24/2008   T12 - L1   ORIF HUMERUS FRACTURE Right 04/17/2022   Procedure: OPEN REDUCTION INTERNAL FIXATION (ORIF) RIGHT DISTAL HUMERUS FRACTURE;  Surgeon: Bjorn Pippin, MD;  Location: [facility] OR;  Service: Orthopedics;  Laterality: Right;   panectomy  2010   TOTAL KNEE ARTHROPLASTY Right 04/2017   Patient Active Problem List   Diagnosis Date Noted   Pain and swelling of left lower leg 05/11/2022   Deep venous thrombosis 05/11/2022   Lumbar muscle hematoma 05/02/2022   Humerus shaft fracture--right 05/02/2022   Constipation 05/02/2022   Uncomplicated opioid dependence 04/25/2022   Chronic pain syndrome 04/25/2022  Candida infection, oral 03/29/2022   Gastric bypass status for obesity 12/18/2021   Multiple gallstones 12/18/2021   Class 2 severe obesity due to excess calories with serious comorbidity and body mass index (BMI) of 35.0 to 35.9 in adult 12/16/2021   Erectile dysfunction due to arterial insufficiency 12/14/2021   Encounter for general adult medical examination with abnormal findings 12/12/2021   Generalized excess and redundant skin after bariatric surgery 06/16/2021   Class 1 obesity due to excess calories with serious comorbidity and body mass index (BMI) of 32.0 to 32.9 in adult 06/15/2021   Benign prostatic hyperplasia without  lower urinary tract symptoms 04/22/2019   Gastroesophageal reflux disease without esophagitis 04/19/2018   Thiamine deficiency 04/14/2018   Other cirrhosis of liver 03/13/2018   NASH (nonalcoholic steatohepatitis) 03/13/2018   Low libido 01/28/2018   Hyperlipidemia LDL goal <130 10/25/2017   Insomnia secondary to chronic pain 06/19/2017   Osteoarthritis of left knee 04/26/2017   Degenerative spondylolisthesis 02/24/2016   Routine general medical examination at a health care facility 06/08/2015   Cough variant asthma 02/06/2015   Zinc deficiency 04/12/2014   B12 deficiency anemia 04/08/2014   Vitamin D deficiency 07/26/2012   History of gastric bypass 07/25/2012   Anemia, iron deficiency 07/25/2012   Status post bariatric surgery 07/25/2012   NARCOTIC ABUSE 03/25/2009   EXOGENOUS OBESITY 08/22/2007   Osteoarthritis of right knee 03/12/2007   Essential hypertension 10/08/2006   BACK PAIN, LUMBAR 10/08/2006    REFERRING PROVIDER:  Bjorn Pippin, MD    REFERRING DIAG: ORIF Humerus, Distal Humerus, Ulnar Nerve Neurolysis 04/17/22   THERAPY DIAG:  Pain in right elbow  Stiffness of right elbow, not elsewhere classified  Muscle weakness (generalized)  Rationale for Evaluation and Treatment: Rehabilitation  ONSET DATE: DOS 04/17/22  SUBJECTIVE:                                                                                                                                                                                      SUBJECTIVE STATEMENT: Pt is 9 weeks and 2 days s/p Right distal humerus ORIF, humeral shaft ORIF, and ulnar nerve neurolysis with anterior transposition.  He saw Dr. Dutch Quint who informed him that his vertebraes in the neck and upper spine were not healed yet.  He removed the hard brace and placed pt in the soft collar.  Pt reports compliance with HEP.  Pt states he had increased R shoulder pain with the reaching exercises though had no increased pain after prior Rx.        EVAL: Have been going to pain clinic for about 15 years for back pain. 4 more weeks in  cervical collar- Dr Dutch Quint treating spine. Pt reports fx 9 vertebrae in back, 2 places in neck, 11 ribs, Rt scapula.  Rt-handed   PERTINENT HISTORY: -Pt states MD doesn't want him to perform exercises pushing against resistance and also not to push up with R UE with transfers. -Pt reports having multiple fractures in back, 2 fractures in cervical, and also 11 rib fractures  Known Rt RC tear, pt reports frequent dislocations 2 prior Lumbar fusions with last fusion being 01/23 Chronic back and R shoulder pain R TKR   PAIN:  Are you having pain? Yes: NPRS scale: 5/10 Pain location: R humerus Pain description: dull Aggravating factors: too much movement Relieving factors: pain meds  PRECAUTIONS: None Per op note: Patient is at high risk for implant failure, nonunion, infection as well as stiffness and heterotopic bone formation secondary to the level comminution.  Pt reports 8lb lifting limit from Dr Jordan Likes on 06/16/22  WEIGHT BEARING RESTRICTIONS: 2lb for 2 wks and then WBAT;  Pt states MD doesn't want him to perform exercises pushing against resistance and also not to push up with R UE with transfers.  Pt reports MD informed him to not lift > 5 lbs in the gym.  FALLS:  Has patient fallen in last 6 months? Yes. Number of falls 1  LIVING ENVIRONMENT: Lives with: lives with their spouse 8 dogs, grand kids on weekend (6 of them)  OCCUPATION: retired  PLOF: Independent  PATIENT GOALS: daily gym workout, independent in ADLs  OBJECTIVE:   PATIENT SURVEYS:  FOTO 25  COGNITION: Overall cognitive status: Within functional limits for tasks assessed     SENSATION: [facility]  UPPER EXTREMITY ROM:   A/P ROM Right eval Rt 3/25 Right 4/17  Shoulder flexion     Shoulder extension     Shoulder abduction     Shoulder adduction     Shoulder internal rotation     Shoulder external rotation      Elbow flexion 90 95 127  Elbow extension -34 -20 AROM/PROM:  -20/-16  Wrist flexion 60    Wrist extension 50 p!    Wrist pronation WFL    Wrist supination -45 -30 74  (Blank rows = not tested)  UPPER EXTREMITY MMT:  MMT Right eval Left eval  Shoulder flexion    Shoulder extension    Shoulder abduction    Shoulder adduction    Shoulder internal rotation    Shoulder external rotation    Middle trapezius    Lower trapezius    Elbow flexion    Elbow extension    Wrist flexion    Wrist extension    Wrist ulnar deviation    Wrist radial deviation    Wrist pronation    Wrist supination    Grip strength (lbs) 20 25  30  60 50 60  (Blank rows = not tested)   PALPATION:  EVAL: stretchy end feel   TODAY'S TREATMENT:  Treatment           4/17:  -Pronation/supination approx 3x10 reps with red Therabar -Therabar bending  3x10 -Resisted elbow flexion 2x10 -static elbow extension stretch 2x2 mins -elbow gentle self stretch 5x5-10 seconds  Pt received R elbow flexion and extension PROM and forearm pronation and supination PROM seated per pt and tissue tolerance.  PT assessed ROM.   Treatment                            4/12:  MANUAL: IASTM incision sites, radial mobs into supination, end range ext mobs, distraction; end range flexion mobilization & scar mobilization Resisted elbow flexion and extension- in supination, in pronation Forward serve in supination, bil UE moving- cues required to avoid upper trap overuse Forward reach to just above 90 deg- limited by shoulder pain   Treatment                            06/08/22:  PROM elbow flexion/ext, supination/pronation, end range stretch Wrist flexion/ext AROM 2x10 biceps curls 3x10 Pronation/supination 2x10ea Therabar bending- pronation & supination Scapular squeezes 2x10  (gentle) Seated shoulder flexion x10 (within available range)    PATIENT EDUCATION: Education details: Anatomy of condition, POC, HEP, exercise form/rationale Person educated: Patient Education method: Explanation, Demonstration, Tactile cues, Verbal cues, and Handouts Education comprehension: verbalized understanding, returned demonstration, verbal cues required, tactile cues required, and needs further education  HOME EXERCISE PROGRAM: [id number]  ASSESSMENT:  CLINICAL IMPRESSION: Pt is improving with elbow and forearm ROM as evidenced by goniometric measurements.  He demonstrated much improved elbow flexion and forearm supination AROM.  He continues to having tightness and limitations with elbow extension ROM though has made progress.  PT focused on improving elbow extension ROM.  Pt tolerated exercises well without c/o's.  Pt is in a soft collar now and states that MD informed him his cervical fractures are still healing.  He responded well to Rx reporting no increased pain after Rx.  He should benefit from cont skilled PT services to address ongoing goals, improve ROM, and improve function.     OBJECTIVE IMPAIRMENTS: Abnormal gait, decreased activity tolerance, decreased balance, decreased endurance, decreased mobility, difficulty walking, decreased ROM, decreased strength, hypomobility, increased edema, increased muscle spasms, impaired flexibility, impaired UE functional use, improper body mechanics, postural dysfunction, and pain.   ACTIVITY LIMITATIONS: carrying, lifting, bending, standing, squatting, sleeping, stairs, transfers, bed mobility, bathing, dressing, reach over head, hygiene/grooming, locomotion level, and caring for others  PARTICIPATION LIMITATIONS: meal prep, cleaning, laundry, driving, shopping, community activity, and yard work  PERSONAL FACTORS:  see history  are also affecting patient's functional outcome.   REHAB POTENTIAL: Good  CLINICAL DECISION MAKING:  Evolving/moderate complexity  EVALUATION COMPLEXITY: Moderate   GOALS: Goals reviewed with patient? Yes  SHORT TERM GOALS: Target date: 3/29  Elbow extension to 0 deg Baseline: -14 on 4/12, -6 following manual therapy Goal status:ongoing    LONG TERM GOALS: Target date: POC date  Meet FOTO goal Baseline:  Goal status: INITIAL  2.  Independent in self-care and dressing without limitation by elbow ROM or pain Baseline:  Goal status: INITIAL  3.  Grip strength within 5 lb of opposite UE Baseline:  Goal status: INITIAL  4.  Able to lift/carry a plate of food without limitation by elbow Baseline:  Goal status: INITIAL   PLAN:  PT FREQUENCY: 1-2x/week  PT DURATION:  8 weeks  PLANNED INTERVENTIONS: Therapeutic exercises, Therapeutic activity, Neuromuscular re-education, Patient/Family education, Self Care, Joint mobilization, Aquatic Therapy, Dry Needling, Electrical stimulation, Spinal mobilization, Cryotherapy, Moist heat, scar mobilization, Taping, Vasopneumatic device, Traction, Ultrasound, Ionotophoresis 4mg /ml Dexamethasone, Manual therapy, and Re-evaluation  PLAN FOR NEXT SESSION: continue with ROM, ther ex, and manual therapy.    Audie Clear III PT, DPT 06/22/22 11:18 PM  PHYSICAL THERAPY DISCHARGE SUMMARY  Visits from Start of Care: 6  Current functional level related to goals / functional outcomes: Unable to assess due to pt not being present at discharge.  See above for measurements taken on 4/17 and assessment   Remaining deficits: Unable to assess due to pt not being present at discharge.    Education / Equipment:    Patient was seen in PT from 05/19/22 to 06/21/22.  Pt cancelled his following PT appointments due to his wife having an accident and needing someone to care for her.  Pt will be considered discharged at this time.   Audie Clear III PT, DPT 04/26/23 12:16 PM

## 2022-06-22 ENCOUNTER — Telehealth: Payer: Self-pay | Admitting: Internal Medicine

## 2022-06-22 NOTE — Telephone Encounter (Signed)
Patient wants to know if he should start taking his blood pressure medication again.  His blood pressure has started going up since coming home from the hospital.  Please call patient and let him know:  [phone]

## 2022-06-26 IMAGING — DX DG RIBS 2V*R*
2 series · 2 of 2 positions shown · non-contrast
Comparison: Chest radiograph February 02, 2020

CLINICAL DATA: Pain following fall

EXAM:
RIGHT RIBS - 2 VIEW

[rib obl]
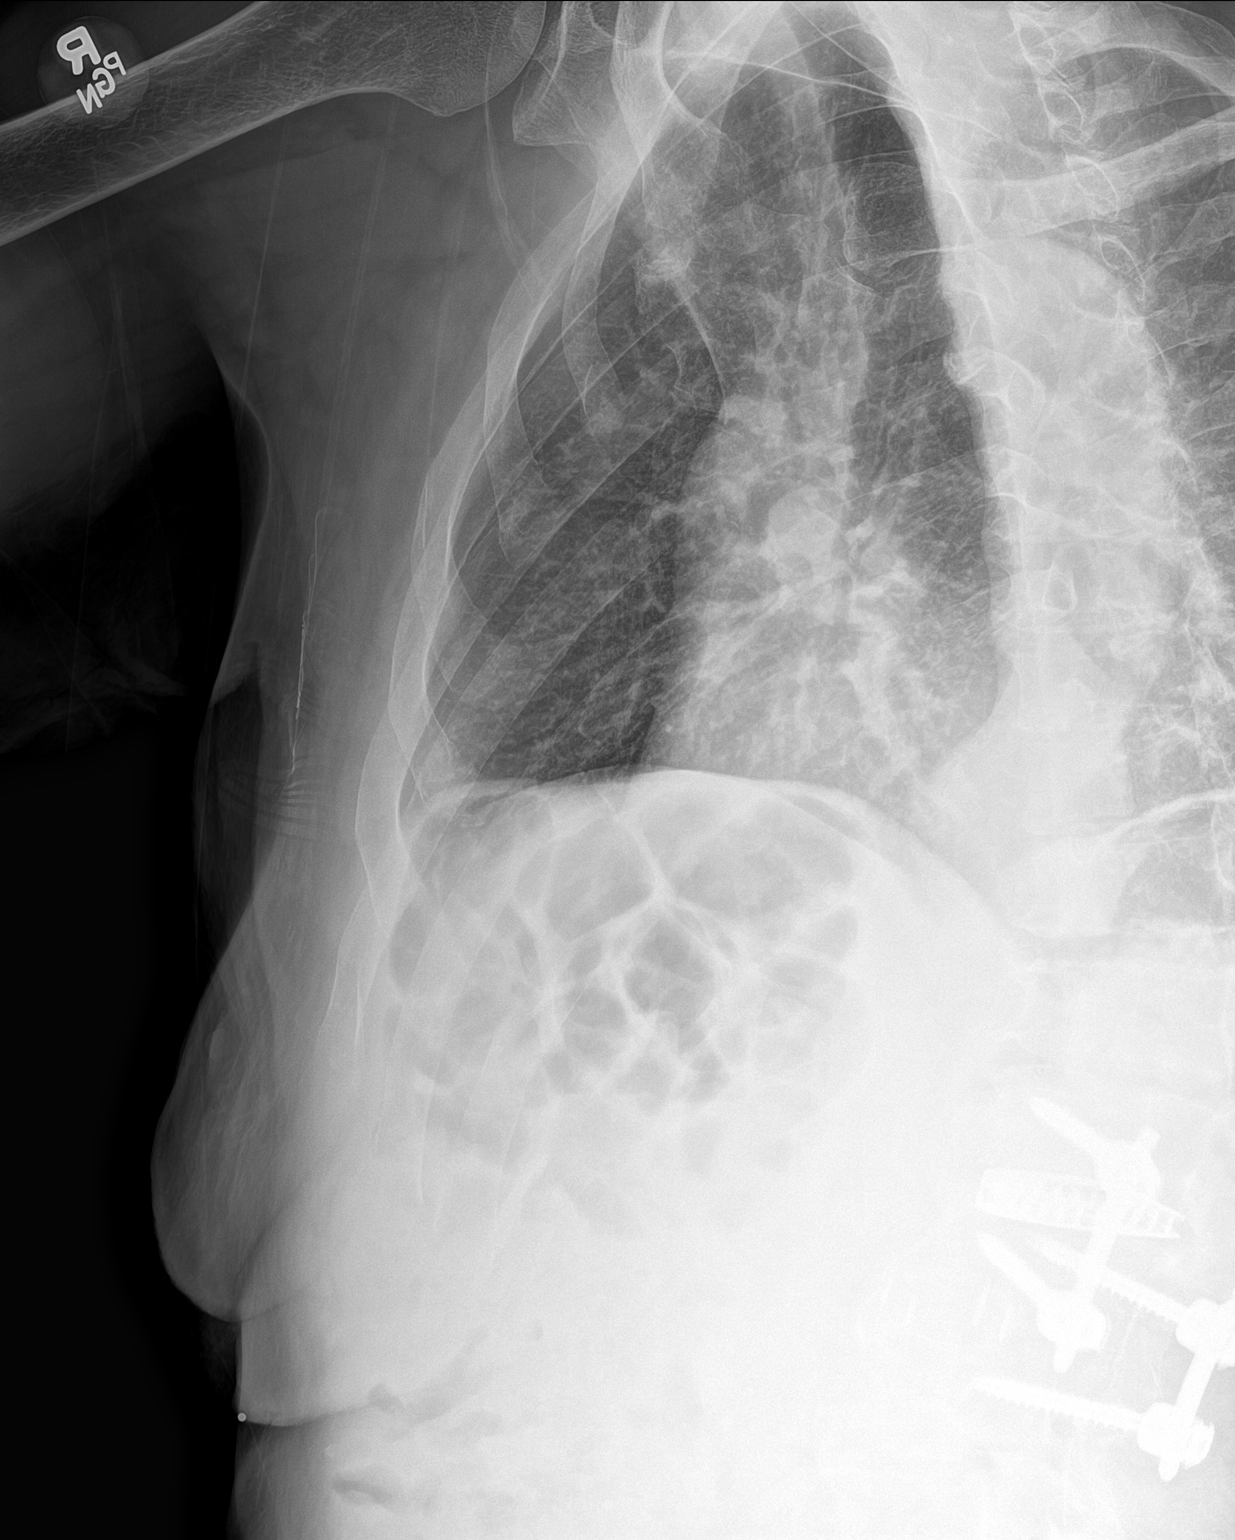

[rib pa]
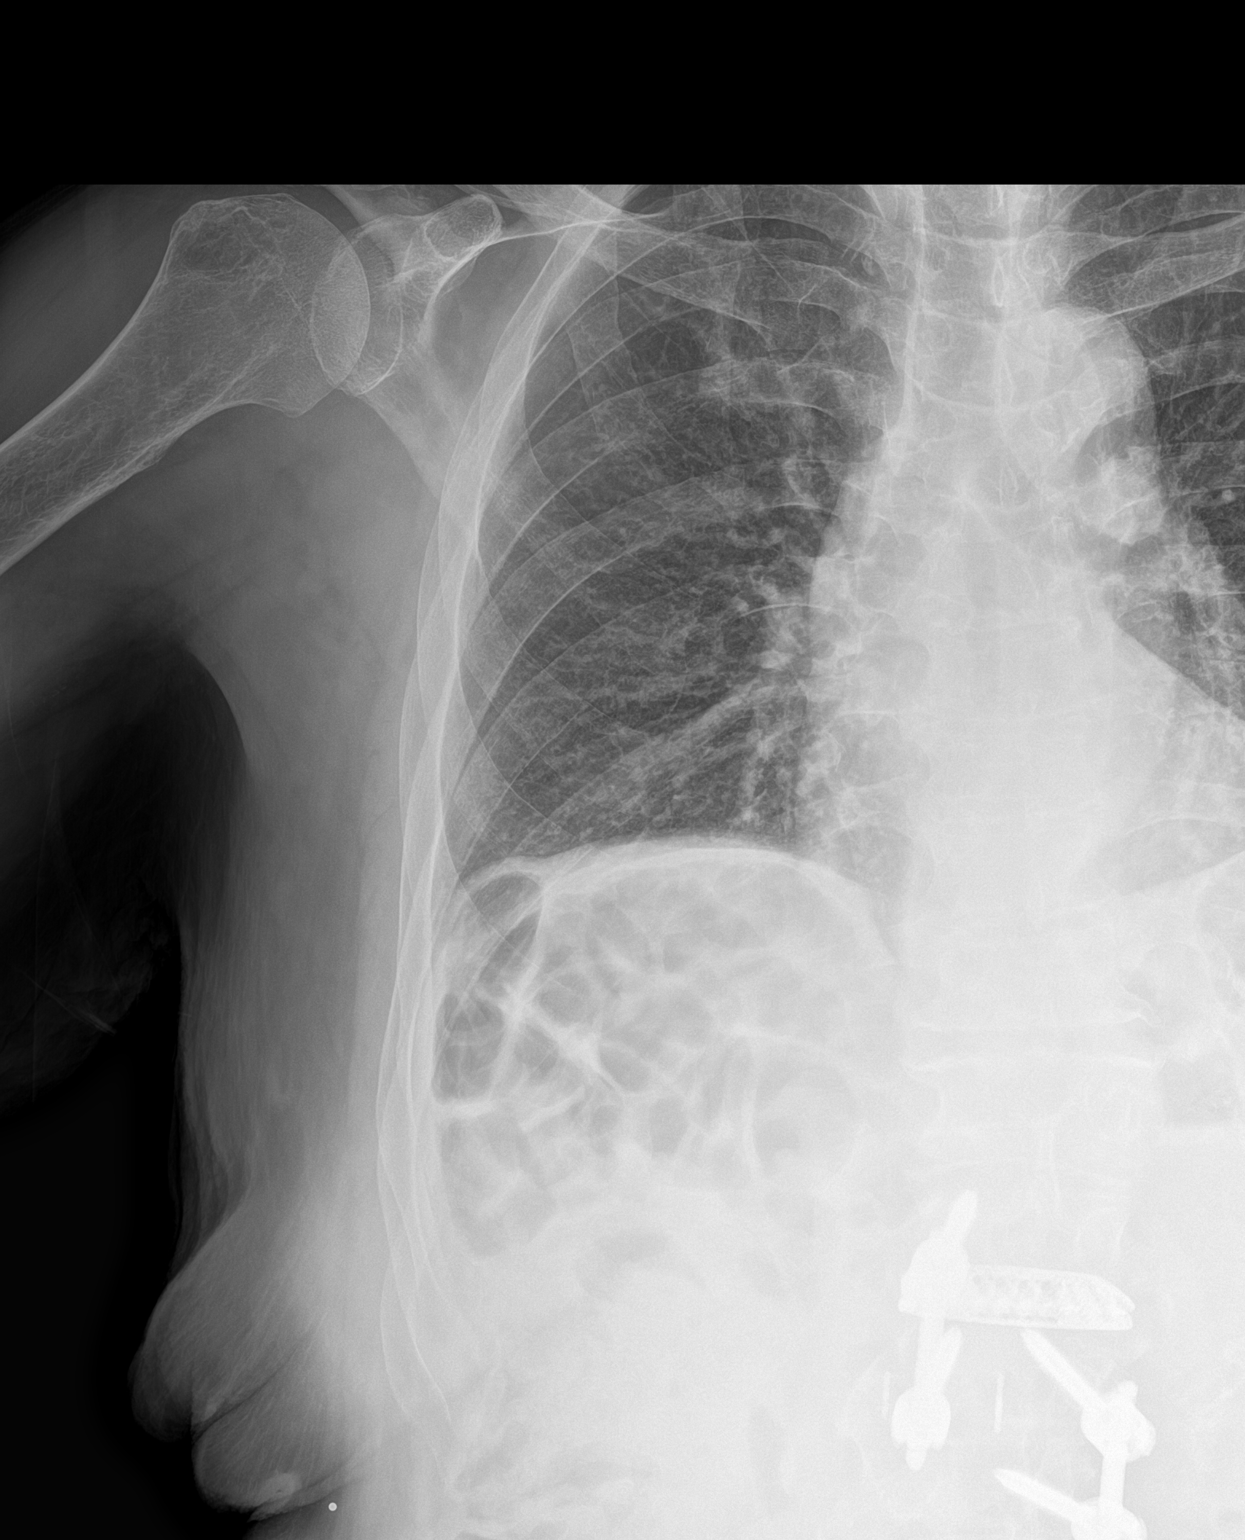

[2 of 2 positions shown; findings below may reference images not displayed]

FINDINGS: Oblique and frontal rib images obtained. No rib fracture
appreciable. No pneumothorax or pleural effusion on the right. Right
lung clear. Heart size normal. Postoperative change in lumbar spine.
IMPRESSION: No demonstrable rib fracture.  Right lung clear.

## 2022-06-27 ENCOUNTER — Encounter (HOSPITAL_BASED_OUTPATIENT_CLINIC_OR_DEPARTMENT_OTHER): Payer: Medicare Other

## 2022-06-29 ENCOUNTER — Other Ambulatory Visit: Payer: Self-pay | Admitting: Internal Medicine

## 2022-06-29 DIAGNOSIS — K5904 Chronic idiopathic constipation: Secondary | ICD-10-CM

## 2022-06-29 DIAGNOSIS — I10 Essential (primary) hypertension: Secondary | ICD-10-CM

## 2022-06-29 MED ORDER — OLMESARTAN MEDOXOMIL 20 MG PO TABS: 20.0000 mg | ORAL_TABLET | Freq: Every day | ORAL | 0 refills | Status: DC

## 2022-06-29 NOTE — Telephone Encounter (Signed)
Pt called having some concerns about his blood pressure medication and he really want to talk to someone about it.

## 2022-06-30 ENCOUNTER — Encounter (HOSPITAL_BASED_OUTPATIENT_CLINIC_OR_DEPARTMENT_OTHER): Payer: Medicare Other | Admitting: Physical Therapy

## 2022-06-30 ENCOUNTER — Telehealth: Payer: Self-pay | Admitting: Internal Medicine

## 2022-06-30 DIAGNOSIS — S42102A Fracture of unspecified part of scapula, left shoulder, initial encounter for closed fracture: Secondary | ICD-10-CM | POA: Diagnosis not present

## 2022-06-30 DIAGNOSIS — S12601A Unspecified nondisplaced fracture of seventh cervical vertebra, initial encounter for closed fracture: Secondary | ICD-10-CM | POA: Diagnosis not present

## 2022-06-30 DIAGNOSIS — S43301A Subluxation of unspecified parts of right shoulder girdle, initial encounter: Secondary | ICD-10-CM | POA: Diagnosis not present

## 2022-06-30 DIAGNOSIS — S52021A Displaced fracture of olecranon process without intraarticular extension of right ulna, initial encounter for closed fracture: Secondary | ICD-10-CM | POA: Diagnosis not present

## 2022-06-30 DIAGNOSIS — S42101A Fracture of unspecified part of scapula, right shoulder, initial encounter for closed fracture: Secondary | ICD-10-CM | POA: Diagnosis not present

## 2022-06-30 MED ORDER — DOCUSATE SODIUM 100 MG PO CAPS: 100.0000 mg | ORAL_CAPSULE | Freq: Two times a day (BID) | ORAL | 0 refills | Status: DC

## 2022-06-30 NOTE — Telephone Encounter (Signed)
Notified pt w/MD response.../lmb 

## 2022-06-30 NOTE — Telephone Encounter (Signed)
Pharmacy called pt wanted to now if he should be taking Lozol (indapamide) with his current blood pressure medication because he has been having low readings with his blood pressure. Please reach out to pt with update.

## 2022-07-04 ENCOUNTER — Encounter (HOSPITAL_BASED_OUTPATIENT_CLINIC_OR_DEPARTMENT_OTHER): Payer: Medicare Other | Admitting: Physical Therapy

## 2022-07-04 DIAGNOSIS — T402X5A Adverse effect of other opioids, initial encounter: Secondary | ICD-10-CM | POA: Diagnosis not present

## 2022-07-04 DIAGNOSIS — G894 Chronic pain syndrome: Secondary | ICD-10-CM | POA: Diagnosis not present

## 2022-07-04 DIAGNOSIS — Z79891 Long term (current) use of opiate analgesic: Secondary | ICD-10-CM | POA: Diagnosis not present

## 2022-07-04 DIAGNOSIS — Z9181 History of falling: Secondary | ICD-10-CM | POA: Diagnosis not present

## 2022-07-04 DIAGNOSIS — M5416 Radiculopathy, lumbar region: Secondary | ICD-10-CM | POA: Diagnosis not present

## 2022-07-04 DIAGNOSIS — M961 Postlaminectomy syndrome, not elsewhere classified: Secondary | ICD-10-CM | POA: Diagnosis not present

## 2022-07-07 ENCOUNTER — Encounter (HOSPITAL_BASED_OUTPATIENT_CLINIC_OR_DEPARTMENT_OTHER): Payer: Medicare Other

## 2022-07-11 ENCOUNTER — Encounter (HOSPITAL_BASED_OUTPATIENT_CLINIC_OR_DEPARTMENT_OTHER): Payer: Medicare Other | Admitting: Physical Therapy

## 2022-07-14 ENCOUNTER — Encounter (HOSPITAL_BASED_OUTPATIENT_CLINIC_OR_DEPARTMENT_OTHER): Payer: Medicare Other | Admitting: Physical Therapy

## 2022-07-18 DIAGNOSIS — S42351D Displaced comminuted fracture of shaft of humerus, right arm, subsequent encounter for fracture with routine healing: Secondary | ICD-10-CM | POA: Diagnosis not present

## 2022-07-19 DIAGNOSIS — S12601A Unspecified nondisplaced fracture of seventh cervical vertebra, initial encounter for closed fracture: Secondary | ICD-10-CM | POA: Diagnosis not present

## 2022-07-30 DIAGNOSIS — S52021A Displaced fracture of olecranon process without intraarticular extension of right ulna, initial encounter for closed fracture: Secondary | ICD-10-CM | POA: Diagnosis not present

## 2022-07-30 DIAGNOSIS — S42101A Fracture of unspecified part of scapula, right shoulder, initial encounter for closed fracture: Secondary | ICD-10-CM | POA: Diagnosis not present

## 2022-07-30 DIAGNOSIS — S12601A Unspecified nondisplaced fracture of seventh cervical vertebra, initial encounter for closed fracture: Secondary | ICD-10-CM | POA: Diagnosis not present

## 2022-07-30 DIAGNOSIS — S42102A Fracture of unspecified part of scapula, left shoulder, initial encounter for closed fracture: Secondary | ICD-10-CM | POA: Diagnosis not present

## 2022-07-30 DIAGNOSIS — S43301A Subluxation of unspecified parts of right shoulder girdle, initial encounter: Secondary | ICD-10-CM | POA: Diagnosis not present

## 2022-08-22 ENCOUNTER — Encounter: Payer: Self-pay | Admitting: Internal Medicine

## 2022-08-29 ENCOUNTER — Other Ambulatory Visit: Payer: Self-pay | Admitting: Internal Medicine

## 2022-08-29 DIAGNOSIS — N400 Enlarged prostate without lower urinary tract symptoms: Secondary | ICD-10-CM

## 2022-08-29 DIAGNOSIS — G8929 Other chronic pain: Secondary | ICD-10-CM

## 2022-08-30 DIAGNOSIS — S52021A Displaced fracture of olecranon process without intraarticular extension of right ulna, initial encounter for closed fracture: Secondary | ICD-10-CM | POA: Diagnosis not present

## 2022-08-30 DIAGNOSIS — S42102A Fracture of unspecified part of scapula, left shoulder, initial encounter for closed fracture: Secondary | ICD-10-CM | POA: Diagnosis not present

## 2022-08-30 DIAGNOSIS — S42101A Fracture of unspecified part of scapula, right shoulder, initial encounter for closed fracture: Secondary | ICD-10-CM | POA: Diagnosis not present

## 2022-08-30 DIAGNOSIS — S43301A Subluxation of unspecified parts of right shoulder girdle, initial encounter: Secondary | ICD-10-CM | POA: Diagnosis not present

## 2022-08-30 DIAGNOSIS — S12601A Unspecified nondisplaced fracture of seventh cervical vertebra, initial encounter for closed fracture: Secondary | ICD-10-CM | POA: Diagnosis not present

## 2022-09-12 DIAGNOSIS — Z79899 Other long term (current) drug therapy: Secondary | ICD-10-CM | POA: Diagnosis not present

## 2022-09-12 DIAGNOSIS — M545 Low back pain, unspecified: Secondary | ICD-10-CM | POA: Diagnosis not present

## 2022-09-12 DIAGNOSIS — Z5181 Encounter for therapeutic drug level monitoring: Secondary | ICD-10-CM | POA: Diagnosis not present

## 2022-09-12 DIAGNOSIS — M5416 Radiculopathy, lumbar region: Secondary | ICD-10-CM | POA: Diagnosis not present

## 2022-09-12 DIAGNOSIS — G894 Chronic pain syndrome: Secondary | ICD-10-CM | POA: Diagnosis not present

## 2022-09-12 DIAGNOSIS — M25511 Pain in right shoulder: Secondary | ICD-10-CM | POA: Diagnosis not present

## 2022-09-20 DIAGNOSIS — M5416 Radiculopathy, lumbar region: Secondary | ICD-10-CM | POA: Diagnosis not present

## 2022-09-21 ENCOUNTER — Other Ambulatory Visit: Payer: Self-pay | Admitting: Internal Medicine

## 2022-09-21 DIAGNOSIS — I10 Essential (primary) hypertension: Secondary | ICD-10-CM

## 2022-09-24 ENCOUNTER — Other Ambulatory Visit: Payer: Self-pay | Admitting: Internal Medicine

## 2022-09-24 DIAGNOSIS — I10 Essential (primary) hypertension: Secondary | ICD-10-CM

## 2022-09-26 DIAGNOSIS — M5416 Radiculopathy, lumbar region: Secondary | ICD-10-CM | POA: Diagnosis not present

## 2022-09-29 DIAGNOSIS — S42101A Fracture of unspecified part of scapula, right shoulder, initial encounter for closed fracture: Secondary | ICD-10-CM | POA: Diagnosis not present

## 2022-09-29 DIAGNOSIS — S43301A Subluxation of unspecified parts of right shoulder girdle, initial encounter: Secondary | ICD-10-CM | POA: Diagnosis not present

## 2022-09-29 DIAGNOSIS — S12601A Unspecified nondisplaced fracture of seventh cervical vertebra, initial encounter for closed fracture: Secondary | ICD-10-CM | POA: Diagnosis not present

## 2022-09-29 DIAGNOSIS — S52021A Displaced fracture of olecranon process without intraarticular extension of right ulna, initial encounter for closed fracture: Secondary | ICD-10-CM | POA: Diagnosis not present

## 2022-09-29 DIAGNOSIS — S42102A Fracture of unspecified part of scapula, left shoulder, initial encounter for closed fracture: Secondary | ICD-10-CM | POA: Diagnosis not present

## 2022-10-01 DIAGNOSIS — M25521 Pain in right elbow: Secondary | ICD-10-CM | POA: Diagnosis not present

## 2022-10-03 DIAGNOSIS — M25521 Pain in right elbow: Secondary | ICD-10-CM | POA: Diagnosis not present

## 2022-10-30 DIAGNOSIS — S43301A Subluxation of unspecified parts of right shoulder girdle, initial encounter: Secondary | ICD-10-CM | POA: Diagnosis not present

## 2022-10-30 DIAGNOSIS — S12601A Unspecified nondisplaced fracture of seventh cervical vertebra, initial encounter for closed fracture: Secondary | ICD-10-CM | POA: Diagnosis not present

## 2022-10-30 DIAGNOSIS — S52021A Displaced fracture of olecranon process without intraarticular extension of right ulna, initial encounter for closed fracture: Secondary | ICD-10-CM | POA: Diagnosis not present

## 2022-10-30 DIAGNOSIS — S42101A Fracture of unspecified part of scapula, right shoulder, initial encounter for closed fracture: Secondary | ICD-10-CM | POA: Diagnosis not present

## 2022-10-30 DIAGNOSIS — S42102A Fracture of unspecified part of scapula, left shoulder, initial encounter for closed fracture: Secondary | ICD-10-CM | POA: Diagnosis not present

## 2022-11-01 DIAGNOSIS — M5416 Radiculopathy, lumbar region: Secondary | ICD-10-CM | POA: Diagnosis not present

## 2022-11-15 DIAGNOSIS — M5416 Radiculopathy, lumbar region: Secondary | ICD-10-CM | POA: Diagnosis not present

## 2022-11-26 ENCOUNTER — Other Ambulatory Visit: Payer: Self-pay | Admitting: Internal Medicine

## 2022-11-26 DIAGNOSIS — G4701 Insomnia due to medical condition: Secondary | ICD-10-CM

## 2022-11-26 DIAGNOSIS — N400 Enlarged prostate without lower urinary tract symptoms: Secondary | ICD-10-CM

## 2022-11-30 ENCOUNTER — Other Ambulatory Visit: Payer: Self-pay | Admitting: Internal Medicine

## 2022-11-30 DIAGNOSIS — G8929 Other chronic pain: Secondary | ICD-10-CM

## 2022-11-30 DIAGNOSIS — N400 Enlarged prostate without lower urinary tract symptoms: Secondary | ICD-10-CM

## 2022-12-14 ENCOUNTER — Encounter: Payer: Self-pay | Admitting: Internal Medicine

## 2022-12-18 ENCOUNTER — Ambulatory Visit (INDEPENDENT_AMBULATORY_CARE_PROVIDER_SITE_OTHER): Payer: Medicare Other

## 2022-12-18 VITALS — Ht 70.0 in | Wt 257.0 lb

## 2022-12-18 DIAGNOSIS — Z0000 Encounter for general adult medical examination without abnormal findings: Secondary | ICD-10-CM | POA: Diagnosis not present

## 2022-12-18 NOTE — Patient Instructions (Addendum)
Bradley [name] , Thank you for taking time to come for your Medicare Wellness Visit. I appreciate your ongoing commitment to your health goals. Please review the following plan we discussed and let me know if I can assist you in the future.   Referrals/Orders/Follow-Ups/Clinician Recommendations: During your up coming wellness visit, ask them to give you an eye exam at that time.  Each day, aim for 6 glasses of water, plenty of protein in your diet and try to get up and walk/ stretch every hour for 5-10 minutes at a time.  It was nice taking with you today.    This is a list of the screening recommended for you and due dates:  Health Maintenance  Topic Date Due   Zoster (Shingles) Vaccine (1 of 2) Never done   COVID-19 Vaccine (3 - Moderna risk series) 12/27/2019   Flu Shot  10/05/2022   Medicare Annual Wellness Visit  12/18/2023   DTaP/Tdap/Td vaccine (3 - Td or Tdap) 09/06/2026   Colon Cancer Screening  05/03/2028   Hepatitis C Screening  Completed   HIV Screening  Completed   HPV Vaccine  Aged Out    Advanced directives: (Copy Requested) Please bring a copy of your health care power of attorney and living will to the office to be added to your chart at your convenience.  Next Medicare Annual Wellness Visit scheduled for next year: Yes   Managing Pain Without Opioids Opioids are strong medicines used to treat moderate to severe pain. For some people, especially those who have long-term (chronic) pain, opioids may not be the best choice for pain management due to: Side effects like nausea, constipation, and sleepiness. The risk of addiction (opioid use disorder). The longer you take opioids, the greater your risk of addiction. Pain that lasts for more than 3 months is called chronic pain. Managing chronic pain usually requires more than one approach and is often provided by a team of health care providers working together (multidisciplinary approach). Pain management may be done at a pain  management center or pain clinic. How to manage pain without the use of opioids Use non-opioid medicines Non-opioid medicines for pain may include: Over-the-counter or prescription non-steroidal anti-inflammatory drugs (NSAIDs). These may be the first medicines used for pain. They work well for muscle and bone pain, and they reduce swelling. Acetaminophen. This over-the-counter medicine may work well for milder pain but not swelling. Antidepressants. These may be used to treat chronic pain. A certain type of antidepressant (tricyclics) is often used. These medicines are given in lower doses for pain than when used for depression. Anticonvulsants. These are usually used to treat seizures but may also reduce nerve (neuropathic) pain. Muscle relaxants. These relieve pain caused by sudden muscle tightening (spasms). You may also use a pain medicine that is applied to the skin as a patch, cream, or gel (topical analgesic), such as a numbing medicine. These may cause fewer side effects than medicines taken by mouth. Do certain therapies as directed Some therapies can help with pain management. They include: Physical therapy. You will do exercises to gain strength and flexibility. A physical therapist may teach you exercises to move and stretch parts of your body that are weak, stiff, or painful. You can learn these exercises at physical therapy visits and practice them at home. Physical therapy may also involve: Massage. Heat wraps or applying heat or cold to affected areas. Electrical signals that interrupt pain signals (transcutaneous electrical nerve stimulation, TENS). Weak lasers that reduce  pain and swelling (low-level laser therapy). Signals from your body that help you learn to regulate pain (biofeedback). Occupational therapy. This helps you to learn ways to function at home and work with less pain. Recreational therapy. This involves trying new activities or hobbies, such as a physical  activity or drawing. Mental health therapy, including: Cognitive behavioral therapy (CBT). This helps you learn coping skills for dealing with pain. Acceptance and commitment therapy (ACT) to change the way you think and react to pain. Relaxation therapies, including muscle relaxation exercises and mindfulness-based stress reduction. Pain management counseling. This may be individual, family, or group counseling.  Receive medical treatments Medical treatments for pain management include: Nerve block injections. These may include a pain blocker and anti-inflammatory medicines. You may have injections: Near the spine to relieve chronic back or neck pain. Into joints to relieve back or joint pain. Into nerve areas that supply a painful area to relieve body pain. Into muscles (trigger point injections) to relieve some painful muscle conditions. A medical device placed near your spine to help block pain signals and relieve nerve pain or chronic back pain (spinal cord stimulation device). Acupuncture. Follow these instructions at home Medicines Take over-the-counter and prescription medicines only as told by your health care provider. If you are taking pain medicine, ask your health care providers about possible side effects to watch out for. Do not drive or use heavy machinery while taking prescription opioid pain medicine. Lifestyle  Do not use drugs or alcohol to reduce pain. If you drink alcohol, limit how much you have to: 0-1 drink a day for women who are not pregnant. 0-2 drinks a day for men. Know how much alcohol is in a drink. In the [place] one drink equals one 12 oz bottle of beer (355 mL), one 5 oz glass of wine (148 mL), or one 1 oz glass of hard liquor (44 mL). Do not use any products that contain nicotine or tobacco. These products include cigarettes, chewing tobacco, and vaping devices, such as e-cigarettes. If you need help quitting, ask your health care provider. Eat a healthy  diet and maintain a healthy weight. Poor diet and excess weight may make pain worse. Eat foods that are high in fiber. These include fresh fruits and vegetables, whole grains, and beans. Limit foods that are high in fat and processed sugars, such as fried and sweet foods. Exercise regularly. Exercise lowers stress and may help relieve pain. Ask your health care provider what activities and exercises are safe for you. If your health care provider approves, join an exercise class that combines movement and stress reduction. Examples include yoga and tai chi. Get enough sleep. Lack of sleep may make pain worse. Lower stress as much as possible. Practice stress reduction techniques as told by your therapist. General instructions Work with all your pain management providers to find the treatments that work best for you. You are an important member of your pain management team. There are many things you can do to reduce pain on your own. Consider joining an online or in-person support group for people who have chronic pain. Keep all follow-up visits. This is important. Where to find more information You can find more information about managing pain without opioids from: American Academy of Pain Medicine: painmed.org Institute for Chronic Pain: instituteforchronicpain.org American Chronic Pain Association: theacpa.org Contact a health care provider if: You have side effects from pain medicine. Your pain gets worse or does not get better with treatments or home  therapy. You are struggling with anxiety or depression. Summary Many types of pain can be managed without opioids. Chronic pain may respond better to pain management without opioids. Pain is best managed when you and a team of health care providers work together. Pain management without opioids may include non-opioid medicines, medical treatments, physical therapy, mental health therapy, and lifestyle changes. Tell your health care providers  if your pain gets worse or is not being managed well enough. This information is not intended to replace advice given to you by your health care provider. Make sure you discuss any questions you have with your health care provider. Document Revised: 06/02/2020 Document Reviewed: 06/02/2020 Elsevier Patient Education  2024 [employer].

## 2022-12-18 NOTE — Progress Notes (Signed)
Subjective:   Bradley [name] is a 52 y.o. male who presents for Medicare Annual/Subsequent preventive examination.  Visit Complete: Virtual I connected with  Bradley [name] on 12/18/22 by a audio enabled telemedicine application and verified that I am speaking with the correct person using two identifiers.  Patient Location: Home  Provider Location: Office/Clinic  I discussed the limitations of evaluation and management by telemedicine. The patient expressed understanding and agreed to proceed.  Vital Signs: Because this visit was a virtual/telehealth visit, some criteria may be missing or patient reported. Any vitals not documented were not able to be obtained and vitals that have been documented are patient reported.  Because this visit was a virtual/telehealth visit, some criteria may be missing or patient reported. Any vitals not documented were not able to be obtained and vitals that have been documented are patient reported.    Cardiac Risk Factors include: advanced age (>17men, >55 women);male gender;hypertension     Objective:    Today's Vitals   12/18/22 0904 12/18/22 0906  Weight: 257 lb (116.6 kg)   Height: 5\' 10"  (1.778 m)   PainSc:  6    Body mass index is 36.88 kg/m.     12/18/2022    9:18 AM 05/22/2022    5:21 PM 04/21/2022    2:16 PM 04/18/2022    8:00 PM 04/14/2022    9:08 PM 01/16/2022    9:26 AM 01/09/2022   10:05 AM  Advanced Directives  Does Patient Have a Medical Advance Directive? Yes Yes Yes Yes Yes Yes Yes  Type of Estate agent of [place];Living will Healthcare Power of [place];Living will Healthcare Power of [place];Living will Healthcare Power of [place];Living will Living will;Healthcare Power of [employer] Power of [place];Living will Healthcare Power of [place];Living will  Does patient want to make changes to medical advance directive?   No - Patient declined No - Guardian declined  No - Patient declined    Copy of Healthcare Power of Attorney in Chart? No - copy requested Yes - validated most recent copy scanned in chart (See row information) Yes - validated most recent copy scanned in chart (See row information) Yes - validated most recent copy scanned in chart (See row information) No - copy available, Physician notified No - copy requested     Current Medications (verified) Outpatient Encounter Medications as of 12/18/2022  Medication Sig   acetaminophen (TYLENOL) 325 MG tablet Take 2 tablets (650 mg total) by mouth 4 (four) times daily.   Baclofen 5 MG TABS Take 1 tablet (5 mg total) by mouth 3 (three) times daily.   DODEX 1000 MCG/ML injection ADMINISTER 1 ML(1000 MCG) IN THE MUSCLE EVERY 30 DAYS (Patient taking differently: Inject 1,000 mcg into the muscle every 30 (thirty) days.)   DULoxetine (CYMBALTA) 20 MG capsule Take 20 mg by mouth 2 (two) times daily.   gabapentin (NEURONTIN) 600 MG tablet Take 1.5 tablets (900 mg total) by mouth 3 (three) times daily.   INS SYRINGE/NEEDLE 1CC/28G 28G X 1/2" 1 ML MISC Use to inject b12 monthly   Insulin Pen Needle 32G X 6 MM MISC 1 Act by Does not apply route daily.   lidocaine (LIDODERM) 5 % Place 2 patches onto the skin daily. Remove & Discard patch within 12 hours or as directed by MD   morphine (MS CONTIN) 30 MG 12 hr tablet Take 1 tablet (30 mg total) by mouth every 12 (twelve) hours.   Multiple Vitamin (MULTIVITAMIN WITH  MINERALS) TABS tablet Take 1 tablet by mouth daily.   olmesartan (BENICAR) 20 MG tablet TAKE 1 TABLET(20 MG) BY MOUTH DAILY   Oxycodone HCl 10 MG TABS Take 10 mg by mouth.   tamsulosin (FLOMAX) 0.4 MG CAPS capsule TAKE 2 CAPSULES(0.8 MG) BY MOUTH DAILY AFTER SUPPER   docusate sodium (COLACE) 100 MG capsule Take 1 capsule (100 mg total) by mouth 2 (two) times daily. (Patient not taking: Reported on 12/18/2022)   Ferric Maltol (ACCRUFER) 30 MG CAPS Take 1 capsule (30 mg total) by mouth in the [name] and at bedtime. (Patient not  taking: Reported on 12/18/2022)   folic acid (FOLVITE) 1 MG tablet TAKE 1 TABLET(1 MG) BY MOUTH DAILY (Patient not taking: Reported on 12/18/2022)   magnesium hydroxide (MILK OF MAGNESIA) 400 MG/5ML suspension Take 30 mLs by mouth daily. (Patient not taking: Reported on 12/18/2022)   melatonin 5 MG TABS Take 1 tablet (5 mg total) by mouth at bedtime. (Patient not taking: Reported on 12/18/2022)   sodium chloride 1 g tablet Take 1 tablet (1 g total) by mouth 2 (two) times daily with a meal. (Patient not taking: Reported on 12/18/2022)   tirzepatide (ZEPBOUND) 5 MG/0.5ML Pen Inject 5 mg into the skin once a week. (Patient not taking: Reported on 12/18/2022)   traMADol (ULTRAM) 50 MG tablet Take 1 tablet (50 mg total) by mouth every 6 (six) hours as needed for moderate pain (give SECOND after Oxycodone). (Patient not taking: Reported on 12/18/2022)   traZODone (DESYREL) 50 MG tablet TAKE 1 TABLET(50 MG) BY MOUTH AT BEDTIME (Patient not taking: Reported on 12/18/2022)   zinc gluconate 50 MG tablet TAKE 1 TABLET(50 MG) BY MOUTH DAILY (Patient not taking: Reported on 12/18/2022)   No facility-administered encounter medications on file as of 12/18/2022.    Allergies (verified) Codeine and Nsaids   History: Past Medical History:  Diagnosis Date   Allergy    Anxiety    Aortic valve sclerosis    Arthritis    Arthropathy, unspecified, other specified sites    CAD (coronary artery disease)    01/09/20: patient denied; faint coronary calcifications on 2016 CT   Depression    GERD (gastroesophageal reflux disease)    Heart murmur    mild MR, mild AS 06/2019 (Dr. Dietrich Pates)   History of kidney stones    Hypertension     " no longer have hypertension since gastric bypass "   Obesity, unspecified    Other, mixed, or unspecified nondependent drug abuse, unspecified    Personal history of urinary calculi    Priapism    Past Surgical History:  Procedure Laterality Date   ANKLE SURGERY Left 2003    ANTERIOR LAT LUMBAR FUSION Left 02/24/2016   Procedure: LEFT LUMBAR TWO-THREE  ANTERIOR LATERAL LUMBAR FUSION  ;  Surgeon: Julio Sicks, MD;  Location[facility] OR;  Service: Neurosurgery;  Laterality: Left;   ANTERIOR LATERAL LUMBAR FUSION WITH PERCUTANEOUS SCREW 1 LEVEL Right 01/13/2020   Procedure: RIGHT LUMBAR ONE-TWO ANTERIOR LATERAL LUMBAR FUSION WITH PERCUTANEOUS SCREW PLACEMENT;  Surgeon: Julio Sicks, MD;  Location: [facility] OR;  Service: Neurosurgery;  Laterality: Right;  3C   CHOLECYSTECTOMY N/A 01/16/2022   Procedure: LAPAROSCOPIC CHOLECYSTECTOMY WITH INTRAOPERATIVE CHOLANGIOGRAM AND ICG DYE;  Surgeon: Gaynelle Adu, MD;  Location: [facility] ORS;  Service: General;  Laterality: N/A;   GASTRIC BYPASS  2008   HERNIA REPAIR Left 2005   groin   INCISION AND DRAINAGE ABSCESS Right 07/06/2004   great toe  IRRIGATION AND DEBRIDEMENT ABSCESS Right 07/08/2004   great toe   KNEE ARTHROPLASTY Right 04/26/2017   Procedure: RIGHT TOTAL KNEE ARTHROPLASTY WITH COMPUTER NAVIGATION;  Surgeon: Samson Frederic, MD;  Location: [facility] ORS;  Service: Orthopedics;  Laterality: Right;  Needs RNFA   KNEE SURGERY Right 2004   LUMBAR PERCUTANEOUS PEDICLE SCREW 1 LEVEL Left 02/24/2016   Procedure: LUMBAR PERCUTANEOUS PEDICLE SCREW 1 LEVEL;  Surgeon: Julio Sicks, MD;  Location: [facility] OR;  Service: Neurosurgery;  Laterality: Left;   MICRODISCECTOMY LUMBAR Left 08/24/2008   T12 - L1   ORIF HUMERUS FRACTURE Right 04/17/2022   Procedure: OPEN REDUCTION INTERNAL FIXATION (ORIF) RIGHT DISTAL HUMERUS FRACTURE;  Surgeon: Bjorn Pippin, MD;  Location: [facility] OR;  Service: Orthopedics;  Laterality: Right;   panectomy  2010   TOTAL KNEE ARTHROPLASTY Right 04/2017   Family History  Problem Relation Age of Onset   Arthritis Mother    Colon cancer Mother    Breast cancer Mother        twice   Kidney failure Mother    Heart disease Father    Hypertension Father    Esophageal cancer Neg Hx    Inflammatory bowel disease Neg Hx    Liver disease Neg  Hx    Pancreatic cancer Neg Hx    Stomach cancer Neg Hx    Rectal cancer Neg Hx    Social History   Socioeconomic History   Marital status: Married    Spouse name: Aggie Cosier   Number of children: 3   Years of education: high school   Highest education level: Not on file  Occupational History   Occupation: disabled  Tobacco Use   Smoking status: Never   Smokeless tobacco: Never  Vaping Use   Vaping status: Never Used  Substance and Sexual Activity   Alcohol use: No   Drug use: No   Sexual activity: Yes  Other Topics Concern   Not on file  Social History Narrative   Lives with wife.   Social Determinants of Health   Financial Resource Strain: Low Risk  (12/18/2022)   Overall Financial Resource Strain (CARDIA)    Difficulty of Paying Living Expenses: Not hard at all  Food Insecurity: No Food Insecurity (12/18/2022)   Hunger Vital Sign    Worried About Running Out of Food in the Last Year: Never true    Ran Out of Food in the Last Year: Never true  Transportation Needs: No Transportation Needs (12/18/2022)   PRAPARE - Administrator, Civil Service (Medical): No    Lack of Transportation (Non-Medical): No  Physical Activity: Insufficiently Active (12/18/2022)   Exercise Vital Sign    Days of Exercise per Week: 3 days    Minutes of Exercise per Session: 20 min  Stress: No Stress Concern Present (12/18/2022)   [employer] of Occupational Health - Occupational Stress Questionnaire    Feeling of Stress : Not at all  Social Connections: Socially Integrated (12/18/2022)   Social Connection and Isolation Panel [NHANES]    Frequency of Communication with Friends and Family: More than three times a week    Frequency of Social Gatherings with Friends and Family: Three times a week    Attends Religious Services: More than 4 times per year    Active Member of Clubs or Organizations: Yes    Attends Banker Meetings: More than 4 times per year     Marital Status: Married    Tobacco Counseling Counseling given:  Not Answered   Clinical Intake:  Pre-visit preparation completed: Yes  Pain : 0-10 Pain Score: 6  Pain Type: Acute pain Pain Location: Hip (and lower back) Pain Orientation: Right Pain Onset: More than a month ago Pain Frequency: Constant     BMI - recorded: 36.88 Nutritional Status: BMI > 30  Obese Nutritional Risks: None  How often do you need to have someone help you when you read instructions, pamphlets, or other written materials from your doctor or pharmacy?: 1 - Never  Interpreter Needed?: No  Information entered by :: Havyn Ramo, RMA   Activities of Daily Living    12/18/2022    9:09 AM 04/26/2022   10:00 AM  In your present state of health, do you have any difficulty performing the following activities:  Hearing? 0   Vision? 0   Difficulty concentrating or making decisions? 0   Walking or climbing stairs? 1   Dressing or bathing? 0   Doing errands, shopping? 0 1  Preparing Food and eating ? N   Using the Toilet? N   In the past six months, have you accidently leaked urine? N   Do you have problems with loss of bowel control? N   Managing your Medications? N   Managing your Finances? N   Housekeeping or managing your Housekeeping? N     Patient Care Team: Etta Grandchild, MD as PCP - General (Internal Medicine) Kathyrn Sheriff, [facility] (Inactive) as Pharmacist (Pharmacist)  Indicate any recent Medical Services you may have received from other than [facility] providers in the past year (date may be approximate).     Assessment:   This is a routine wellness examination for Thao.  Hearing/Vision screen Hearing Screening - Comments:: Denies hearing difficulties   Vision Screening - Comments:: Denies vision issues   Goals Addressed             This Visit's Progress    Patient Stated   On track    Maintain my current health status and stay as independent as possible.       Depression Screen    12/18/2022    9:22 AM 05/11/2022    1:12 PM 03/29/2022    9:11 AM 12/12/2021    3:13 PM 12/01/2021   12:43 PM 02/02/2020    3:09 PM 10/25/2017    4:02 PM  PHQ 2/9 Scores  PHQ - 2 Score 2 4 0 0 0 0 0  PHQ- 9 Score 7 11     6     Fall Risk    12/18/2022    9:18 AM 05/11/2022    1:10 PM 03/29/2022    9:11 AM 12/12/2021    3:13 PM 12/01/2021   12:48 PM  Fall Risk   Falls in the past year? 0 1 0 0 0  Number falls in past yr: 0 0 0 0 0  Injury with Fall? 0 1 0 0 0  Comment  fell 49ft from attic shattered right arm     Risk for fall due to : No Fall Risks Other (Comment) No Fall Risks No Fall Risks History of fall(s);Impaired balance/gait;Impaired mobility  Follow up Falls prevention discussed;Falls evaluation completed Falls evaluation completed Falls evaluation completed Falls evaluation completed Falls evaluation completed    MEDICARE RISK AT HOME: Medicare Risk at Home Any stairs in or around the home?: Yes If so, are there any without handrails?: Yes Home free of loose throw rugs in walkways, pet beds, electrical cords, etc?:  Yes Adequate lighting in your home to reduce risk of falls?: Yes Life alert?: No Use of a cane, walker or w/c?: Yes Grab bars in the bathroom?: Yes Shower chair or bench in shower?: Yes  TIMED UP AND GO:  Was the test performed?  No    Cognitive Function:        12/18/2022    9:19 AM 12/01/2021   12:46 PM  6CIT Screen  What Year? 0 points 0 points  What month? 0 points   What time? 0 points 0 points  Count back from 20 0 points 0 points  Months in reverse 0 points 0 points  Repeat phrase 0 points 0 points  Total Score 0 points     Immunizations Immunization History  Administered Date(s) Administered   Hep A / Hep B 09/26/2016, 10/27/2016, 01/29/2017   Influenza,inj,Quad PF,6+ Mos 12/07/2017, 04/22/2019   Moderna Sars-Covid-2 Vaccination 10/26/2019, 11/29/2019   Pneumococcal Conjugate-13 07/25/2012   Tdap 08/28/2007,  09/05/2016    TDAP status: Up to date  Flu Vaccine status: Declined, Education has been provided regarding the importance of this vaccine but patient still declined. Advised may receive this vaccine at local pharmacy or Health Dept. Aware to provide a copy of the vaccination record if obtained from local pharmacy or Health Dept. Verbalized acceptance and understanding.  Pneumococcal vaccine status: Declined,  Education has been provided regarding the importance of this vaccine but patient still declined. Advised may receive this vaccine at local pharmacy or Health Dept. Aware to provide a copy of the vaccination record if obtained from local pharmacy or Health Dept. Verbalized acceptance and understanding.   Covid-19 vaccine status: Declined, Education has been provided regarding the importance of this vaccine but patient still declined. Advised may receive this vaccine at local pharmacy or Health Dept.or vaccine clinic. Aware to provide a copy of the vaccination record if obtained from local pharmacy or Health Dept. Verbalized acceptance and understanding.  Qualifies for Shingles Vaccine? Yes   Zostavax completed No   Shingrix Completed?: No.    Education has been provided regarding the importance of this vaccine. Patient has been advised to call insurance company to determine out of pocket expense if they have not yet received this vaccine. Advised may also receive vaccine at local pharmacy or Health Dept. Verbalized acceptance and understanding.  Screening Tests Health Maintenance  Topic Date Due   Zoster Vaccines- Shingrix (1 of 2) Never done   COVID-19 Vaccine (3 - Moderna risk series) 12/27/2019   INFLUENZA VACCINE  10/05/2022   Medicare Annual Wellness (AWV)  12/18/2023   DTaP/Tdap/Td (3 - Td or Tdap) 09/06/2026   Colonoscopy  05/03/2028   Hepatitis C Screening  Completed   HIV Screening  Completed   HPV VACCINES  Aged Out    Health Maintenance  Health Maintenance Due  Topic  Date Due   Zoster Vaccines- Shingrix (1 of 2) Never done   COVID-19 Vaccine (3 - Moderna risk series) 12/27/2019   INFLUENZA VACCINE  10/05/2022    Colorectal cancer screening: Type of screening: Colonoscopy. Completed 05/03/2018. Repeat every 10 years  Lung Cancer Screening: (Low Dose CT Chest recommended if Age 26-80 years, 20 pack-year currently smoking OR have quit w/in 15years.) does not qualify.   Lung Cancer Screening Referral: N/A  Additional Screening:  Hepatitis C Screening: does qualify; Completed 09/11/2016  Vision Screening: Recommended annual ophthalmology exams for early detection of glaucoma and other disorders of the eye. Is the patient up to date  with their annual eye exam?  No  Who is the provider or what is the name of the office in which the patient attends annual eye exams? N/A If pt is not established with a provider, would they like to be referred to a provider to establish care? Yes .   Dental Screening: Recommended annual dental exams for proper oral hygiene    Community Resource Referral / Chronic Care Management: CRR required this visit?  No   CCM required this visit?  No     Plan:     I have personally reviewed and noted the following in the patient's chart:   Medical and social history Use of alcohol, tobacco or illicit drugs  Current medications and supplements including opioid prescriptions. Patient is currently taking opioid prescriptions. Information provided to patient regarding non-opioid alternatives. Patient advised to discuss non-opioid treatment plan with their provider. Functional ability and status Nutritional status Physical activity Advanced directives List of other physicians Hospitalizations, surgeries, and ER visits in previous 12 months Vitals Screenings to include cognitive, depression, and falls Referrals and appointments  In addition, I have reviewed and discussed with patient certain preventive protocols, quality  metrics, and best practice recommendations. A written personalized care plan for preventive services as well as general preventive health recommendations were provided to patient.     Artelia Game L Sarai January, CMA   12/18/2022   After Visit Summary: (MyChart) Due to this being a telephonic visit, the after visit summary with patients personalized plan was offered to patient via MyChart   Nurse Notes: Patient is due for Flu, Covid and Shingles vaccines, however, he declines them.  Patient stated that he needs an annual eye exam, but has been told Medicare does not cover regular check ups.  He would like to have his eyes checked during his up coming Wellness visit with PCP.  Patient had no other concerns to address today.

## 2022-12-19 DIAGNOSIS — H11433 Conjunctival hyperemia, bilateral: Secondary | ICD-10-CM | POA: Diagnosis not present

## 2022-12-30 ENCOUNTER — Other Ambulatory Visit: Payer: Self-pay | Admitting: Internal Medicine

## 2022-12-30 DIAGNOSIS — I10 Essential (primary) hypertension: Secondary | ICD-10-CM

## 2023-01-02 ENCOUNTER — Encounter: Payer: Self-pay | Admitting: Internal Medicine

## 2023-01-02 ENCOUNTER — Ambulatory Visit (INDEPENDENT_AMBULATORY_CARE_PROVIDER_SITE_OTHER): Payer: Medicare Other | Admitting: Internal Medicine

## 2023-01-02 VITALS — BP 136/84 | HR 61 | Temp 98.1°F | Resp 16 | Ht 70.0 in | Wt 269.6 lb

## 2023-01-02 DIAGNOSIS — Z0001 Encounter for general adult medical examination with abnormal findings: Secondary | ICD-10-CM

## 2023-01-02 DIAGNOSIS — E785 Hyperlipidemia, unspecified: Secondary | ICD-10-CM

## 2023-01-02 DIAGNOSIS — Z9884 Bariatric surgery status: Secondary | ICD-10-CM

## 2023-01-02 DIAGNOSIS — N400 Enlarged prostate without lower urinary tract symptoms: Secondary | ICD-10-CM

## 2023-01-02 DIAGNOSIS — E559 Vitamin D deficiency, unspecified: Secondary | ICD-10-CM | POA: Diagnosis not present

## 2023-01-02 DIAGNOSIS — K7581 Nonalcoholic steatohepatitis (NASH): Secondary | ICD-10-CM | POA: Diagnosis not present

## 2023-01-02 DIAGNOSIS — E519 Thiamine deficiency, unspecified: Secondary | ICD-10-CM | POA: Diagnosis not present

## 2023-01-02 DIAGNOSIS — E60 Dietary zinc deficiency: Secondary | ICD-10-CM | POA: Diagnosis not present

## 2023-01-02 DIAGNOSIS — Z0000 Encounter for general adult medical examination without abnormal findings: Secondary | ICD-10-CM | POA: Diagnosis not present

## 2023-01-02 DIAGNOSIS — I10 Essential (primary) hypertension: Secondary | ICD-10-CM

## 2023-01-02 DIAGNOSIS — E66812 Obesity, class 2: Secondary | ICD-10-CM

## 2023-01-02 DIAGNOSIS — K7469 Other cirrhosis of liver: Secondary | ICD-10-CM | POA: Diagnosis not present

## 2023-01-02 DIAGNOSIS — D510 Vitamin B12 deficiency anemia due to intrinsic factor deficiency: Secondary | ICD-10-CM | POA: Diagnosis not present

## 2023-01-02 DIAGNOSIS — D508 Other iron deficiency anemias: Secondary | ICD-10-CM | POA: Diagnosis not present

## 2023-01-02 LAB — TSH: TSH: 2.07 u[IU]/mL (ref 0.35–5.50)

## 2023-01-02 LAB — CBC WITH DIFFERENTIAL/PLATELET
Basophils Absolute: 0 10*3/uL (ref 0.0–0.1)
Basophils Relative: 0.4 % (ref 0.0–3.0)
Eosinophils Absolute: 0.1 10*3/uL (ref 0.0–0.7)
Eosinophils Relative: 2.1 % (ref 0.0–5.0)
HCT: 39.2 % (ref 39.0–52.0)
Hemoglobin: 12.3 g/dL — ABNORMAL LOW (ref 13.0–17.0)
Lymphocytes Relative: 40.1 % (ref 12.0–46.0)
Lymphs Abs: 2.8 10*3/uL (ref 0.7–4.0)
MCHC: 31.4 g/dL (ref 30.0–36.0)
MCV: 83.1 fL (ref 78.0–100.0)
Monocytes Absolute: 0.7 10*3/uL (ref 0.1–1.0)
Monocytes Relative: 10.4 % (ref 3.0–12.0)
Neutro Abs: 3.3 10*3/uL (ref 1.4–7.7)
Neutrophils Relative %: 47 % (ref 43.0–77.0)
Platelets: 234 10*3/uL (ref 150.0–400.0)
RBC: 4.71 Mil/uL (ref 4.22–5.81)
RDW: 14.4 % (ref 11.5–15.5)
WBC: 7 10*3/uL (ref 4.0–10.5)

## 2023-01-02 LAB — LIPID PANEL
Cholesterol: 152 mg/dL (ref 0–200)
HDL: 50.1 mg/dL (ref >39.00)
LDL Cholesterol: 83 mg/dL (ref 0–99)
NonHDL: 101.76
Total CHOL/HDL Ratio: 3
Triglycerides: 95 mg/dL (ref 0.0–149.0)
VLDL: 19 mg/dL (ref 0.0–40.0)

## 2023-01-02 LAB — HEPATIC FUNCTION PANEL
ALT: 20 U/L (ref 0–53)
AST: 17 U/L (ref 0–37)
Albumin: 3.9 g/dL (ref 3.5–5.2)
Alkaline Phosphatase: 100 U/L (ref 39–117)
Bilirubin, Direct: 0.1 mg/dL (ref 0.0–0.3)
Total Bilirubin: 0.4 mg/dL (ref 0.2–1.2)
Total Protein: 6.4 g/dL (ref 6.0–8.3)

## 2023-01-02 LAB — PROTIME-INR
INR: 1 {ratio} (ref 0.8–1.0)
Prothrombin Time: 11 s (ref 9.6–13.1)

## 2023-01-02 LAB — BASIC METABOLIC PANEL
BUN: 13 mg/dL (ref 6–23)
CO2: 28 meq/L (ref 19–32)
Calcium: 8.7 mg/dL (ref 8.4–10.5)
Chloride: 103 meq/L (ref 96–112)
Creatinine, Ser: 0.9 mg/dL (ref 0.40–1.50)
GFR: 96.99 mL/min (ref >60.00)
Glucose, Bld: 94 mg/dL (ref 70–99)
Potassium: 4.7 meq/L (ref 3.5–5.1)
Sodium: 135 meq/L (ref 135–145)

## 2023-01-02 LAB — PSA: PSA: 0.56 ng/mL (ref 0.10–4.00)

## 2023-01-02 MED ORDER — PHENTERMINE HCL 37.5 MG PO TABS: 37.5000 mg | ORAL_TABLET | Freq: Every day | ORAL | 0 refills | Status: DC

## 2023-01-02 NOTE — Progress Notes (Signed)
Subjective:  Patient ID: Bradley [name], male    DOB: 02-21-1971  Age: 52 y.o. MRN: [id number]  CC: Annual Exam, Anemia, Hypertension, and Hyperlipidemia   HPI BURAK ZERBE presents for a CPX and f/up ---   Discussed the use of AI scribe software for clinical note transcription with the patient, who gave verbal consent to proceed.  History of Present Illness   The patient, with a history of iron deficiency, presents for a routine physical. He is an active walker. He denies experiencing chest pain, shortness of breath, dizziness, or lightheadedness. He also reports an issue with a previously prescribed iron supplement, which he no longer takes.  The patient has a history of urinary issues, with no recent changes or complications reported. He also reports a recent weight gain, which he has attempted to manage with an injectable medication. However, due to insurance issues, he has discontinued this treatment and is considering a return to a previous medication, phentermine.  The patient denies receiving flu, pneumonia, or shingles vaccines.       Outpatient Medications Prior to Visit  Medication Sig Dispense Refill   acetaminophen (TYLENOL) 325 MG tablet Take 2 tablets (650 mg total) by mouth 4 (four) times daily.     Baclofen 5 MG TABS Take 1 tablet (5 mg total) by mouth 3 (three) times daily. 90 tablet 0   DULoxetine (CYMBALTA) 20 MG capsule Take 20 mg by mouth 2 (two) times daily.     gabapentin (NEURONTIN) 600 MG tablet Take 1.5 tablets (900 mg total) by mouth 3 (three) times daily. 135 tablet 0   magnesium hydroxide (MILK OF MAGNESIA) 400 MG/5ML suspension Take 30 mLs by mouth daily. (Patient taking differently: Take 30 mLs by mouth daily as needed.) 355 mL 0   morphine (MS CONTIN) 30 MG 12 hr tablet Take 1 tablet (30 mg total) by mouth every 12 (twelve) hours. 14 tablet 0   Multiple Vitamin (MULTIVITAMIN WITH MINERALS) TABS tablet Take 1 tablet by mouth daily. 30 tablet 0    Oxycodone HCl 10 MG TABS Take 10 mg by mouth.     tamsulosin (FLOMAX) 0.4 MG CAPS capsule TAKE 2 CAPSULES(0.8 MG) BY MOUTH DAILY AFTER SUPPER 180 capsule 0   olmesartan (BENICAR) 20 MG tablet TAKE 1 TABLET(20 MG) BY MOUTH DAILY 90 tablet 0   folic acid (FOLVITE) 1 MG tablet TAKE 1 TABLET(1 MG) BY MOUTH DAILY (Patient not taking: Reported on 12/18/2022) 90 tablet 1   docusate sodium (COLACE) 100 MG capsule Take 1 capsule (100 mg total) by mouth 2 (two) times daily. (Patient not taking: Reported on 12/18/2022) 180 capsule 0   DODEX 1000 MCG/ML injection ADMINISTER 1 ML(1000 MCG) IN THE MUSCLE EVERY 30 DAYS 3 mL 0   Ferric Maltol (ACCRUFER) 30 MG CAPS Take 1 capsule (30 mg total) by mouth in the morning and at bedtime. (Patient not taking: Reported on 12/18/2022) 180 capsule 1   INS SYRINGE/NEEDLE 1CC/28G 28G X 1/2" 1 ML MISC Use to inject b12 monthly 10 each 1   Insulin Pen Needle 32G X 6 MM MISC 1 Act by Does not apply route daily. 100 each 1   lidocaine (LIDODERM) 5 % Place 2 patches onto the skin daily. Remove & Discard patch within 12 hours or as directed by MD 60 patch 0   melatonin 5 MG TABS Take 1 tablet (5 mg total) by mouth at bedtime. (Patient not taking: Reported on 12/18/2022) 30 tablet 0   sodium  chloride 1 g tablet Take 1 tablet (1 g total) by mouth 2 (two) times daily with a meal. (Patient not taking: Reported on 12/18/2022) 60 tablet 0   tirzepatide (ZEPBOUND) 5 MG/0.5ML Pen Inject 5 mg into the skin once a week. (Patient not taking: Reported on 12/18/2022) 4 mL 0   traMADol (ULTRAM) 50 MG tablet Take 1 tablet (50 mg total) by mouth every 6 (six) hours as needed for moderate pain (give SECOND after Oxycodone). (Patient not taking: Reported on 12/18/2022) 28 tablet 0   traZODone (DESYREL) 50 MG tablet TAKE 1 TABLET(50 MG) BY MOUTH AT BEDTIME (Patient not taking: Reported on 12/18/2022) 90 tablet 0   zinc gluconate 50 MG tablet TAKE 1 TABLET(50 MG) BY MOUTH DAILY (Patient not taking:  Reported on 12/18/2022) 90 tablet 1   No facility-administered medications prior to visit.    ROS Review of Systems  Constitutional:  Positive for fatigue and unexpected weight change (wt gain). Negative for appetite change, chills and diaphoresis.  Respiratory: Negative.  Negative for cough, chest tightness, shortness of breath and wheezing.   Cardiovascular:  Negative for chest pain, palpitations and leg swelling.  Gastrointestinal:  Negative for abdominal pain, constipation, diarrhea, nausea and vomiting.  Genitourinary: Negative.  Negative for difficulty urinating.  Musculoskeletal:  Positive for arthralgias, back pain and gait problem.  Skin: Negative.   Neurological:  Negative for dizziness and light-headedness.  Hematological:  Negative for adenopathy. Does not bruise/bleed easily.  Psychiatric/Behavioral: Negative.      Objective:  BP 136/84 (BP Location: Left Arm, Patient Position: Sitting, Cuff Size: Large)   Pulse 61   Temp 98.1 F (36.7 C) (Oral)   Resp 16   Ht 5\' 10"  (1.778 m)   Wt 269 lb 9.6 oz (122.3 kg)   SpO2 99%   BMI 38.68 kg/m   BP Readings from Last 3 Encounters:  01/02/23 136/84  05/11/22 128/88  05/02/22 121/80    Wt Readings from Last 3 Encounters:  01/02/23 269 lb 9.6 oz (122.3 kg)  12/18/22 257 lb (116.6 kg)  05/11/22 257 lb 12.8 oz (116.9 kg)    Physical Exam Vitals reviewed.  Constitutional:      Appearance: Normal appearance.  HENT:     Mouth/Throat:     Mouth: Mucous membranes are moist.  Eyes:     General: No scleral icterus.    Conjunctiva/sclera: Conjunctivae normal.  Cardiovascular:     Rate and Rhythm: Normal rate and regular rhythm.     Heart sounds: Murmur heard.     Systolic murmur is present with a grade of 2/6.     No friction rub. No gallop.  Pulmonary:     Effort: Pulmonary effort is normal.     Breath sounds: No stridor. No wheezing, rhonchi or rales.  Abdominal:     General: Abdomen is protuberant. Bowel sounds  are normal. There is no distension.     Palpations: Abdomen is soft. There is no hepatomegaly, splenomegaly or mass.     Tenderness: There is no abdominal tenderness. There is no guarding.     Hernia: No hernia is present. There is no hernia in the left inguinal area or right inguinal area.  Genitourinary:    Pubic Area: No rash.      Penis: Normal.      Testes: Normal.     Epididymis:     Right: Normal.     Left: Normal.     Prostate: Enlarged. Not tender and no nodules  present.     Rectum: Normal. Guaiac result negative. No mass, tenderness, anal fissure, external hemorrhoid or internal hemorrhoid. Normal anal tone.  Musculoskeletal:     Cervical back: Normal range of motion and neck supple.     Right lower leg: No edema.     Left lower leg: No edema.  Lymphadenopathy:     Cervical: No cervical adenopathy.     Lower Body: No right inguinal adenopathy. No left inguinal adenopathy.  Skin:    General: Skin is warm and dry.  Neurological:     General: No focal deficit present.     Mental Status: He is alert. Mental status is at baseline.  Psychiatric:        Mood and Affect: Mood normal.        Behavior: Behavior normal.     Lab Results  Component Value Date   WBC 7.0 01/02/2023   HGB 12.3 (L) 01/02/2023   HCT 39.2 01/02/2023   PLT 234.0 01/02/2023   GLUCOSE 94 01/02/2023   CHOL 152 01/02/2023   TRIG 95.0 01/02/2023   HDL 50.10 01/02/2023   LDLCALC 83 01/02/2023   ALT 20 01/02/2023   AST 17 01/02/2023   NA 135 01/02/2023   K 4.7 01/02/2023   CL 103 01/02/2023   CREATININE 0.90 01/02/2023   BUN 13 01/02/2023   CO2 28 01/02/2023   TSH 2.07 01/02/2023   PSA 0.56 01/02/2023   INR 1.0 01/02/2023   HGBA1C 5.6 06/15/2021    VAS [place] LOWER EXTREMITY VENOUS (DVT)  Result Date: 05/12/2022  Lower Venous DVT Study Patient Name:  CHEVIS WEISENSEL  Date of Exam:   05/12/2022 Medical Rec #: [id number]        Accession #:    [id number] Date of Birth: August 04, 1970        Patient Gender:  M Patient Age:   12 years Exam Location:  [facility] Procedure:      VAS [place] LOWER EXTREMITY VENOUS (DVT) Referring Phys: Sanda Linger --------------------------------------------------------------------------------  Indications: Pain, and Swelling.  Performing Technologist: [place] Ponder RVS  Examination Guidelines: A complete evaluation includes B-mode imaging, spectral Doppler, color Doppler, and power Doppler as needed of all accessible portions of each vessel. Bilateral testing is considered an integral part of a complete examination. Limited examinations for reoccurring indications may be performed as noted. The reflux portion of the exam is performed with the patient in reverse Trendelenburg.  +-----+---------------+---------+-----------+----------+--------------+ RIGHTCompressibilityPhasicitySpontaneityPropertiesThrombus Aging +-----+---------------+---------+-----------+----------+--------------+ CFV  Full           Yes      Yes                                 +-----+---------------+---------+-----------+----------+--------------+   +---------+---------------+---------+-----------+----------+--------------+ LEFT     CompressibilityPhasicitySpontaneityPropertiesThrombus Aging +---------+---------------+---------+-----------+----------+--------------+ CFV      Full           Yes      Yes                                 +---------+---------------+---------+-----------+----------+--------------+ SFJ      Full                                                        +---------+---------------+---------+-----------+----------+--------------+  FV Prox  Full                                                        +---------+---------------+---------+-----------+----------+--------------+ FV Mid   Full                                                        +---------+---------------+---------+-----------+----------+--------------+ FV DistalFull                                                         +---------+---------------+---------+-----------+----------+--------------+ PFV      Full                                                        +---------+---------------+---------+-----------+----------+--------------+ POP      Full           Yes      Yes                                 +---------+---------------+---------+-----------+----------+--------------+ PTV      Full                                                        +---------+---------------+---------+-----------+----------+--------------+ PERO     Full                                                        +---------+---------------+---------+-----------+----------+--------------+     Summary: RIGHT: - No evidence of common femoral vein obstruction.  LEFT: - There is no evidence of deep vein thrombosis in the lower extremity.  - No cystic structure found in the popliteal fossa.  *See table(s) above for measurements and observations. Electronically signed by Gerarda Fraction on 05/12/2022 at 9:36:15 AM.    Final     Assessment & Plan:   Essential hypertension- His BP is adequately well controlled. -     Basic metabolic panel; Future -     TSH; Future -     Urinalysis, Routine w reflex microscopic; Future -     Olmesartan Medoxomil; Take 1 tablet (20 mg total) by mouth daily.  Dispense: 90 tablet; Refill: 1  Vitamin B12 deficiency anemia due to intrinsic factor deficiency -     CBC with Differential/Platelet; Future -     Folate; Future  Zinc deficiency -     CBC with Differential/Platelet; Future -     Zinc; Future  Gastric bypass  status for obesity -     Vitamin B12; Future -     IBC + Ferritin; Future -     Vitamin B1; Future -     Zinc; Future -     Folate; Future -     VITAMIN D 25 Hydroxy (Vit-D Deficiency, Fractures); Future -     ACCRUFeR; Take 1 capsule (30 mg total) by mouth in the morning and at bedtime.  Dispense: 180 capsule; Refill:  1 -     Cholecalciferol; Take 1 capsule (5,000 Units total) by mouth daily.  Dispense: 90 capsule; Refill: 1  Thiamine deficiency -     CBC with Differential/Platelet; Future -     Vitamin B1; Future  Hyperlipidemia LDL goal <130-statin is not indicated. -     Lipid panel; Future -     TSH; Future  Vitamin D deficiency -     VITAMIN D 25 Hydroxy (Vit-D Deficiency, Fractures); Future -     Cholecalciferol; Take 1 capsule (5,000 Units total) by mouth daily.  Dispense: 90 capsule; Refill: 1  NASH (nonalcoholic steatohepatitis) -     Hepatic function panel; Future -     Protime-INR; Future  Other cirrhosis of liver (HCC) -     Hepatic function panel; Future -     Protime-INR; Future  Benign prostatic hyperplasia without lower urinary tract symptoms -     PSA; Future -     Urinalysis, Routine w reflex microscopic; Future  Class 2 severe obesity due to excess calories with serious comorbidity and body mass index (BMI) of 37.0 to 37.9 in adult [facility]) -     Phentermine HCl; Take 1 tablet (37.5 mg total) by mouth daily before breakfast.  Dispense: 90 tablet; Refill: 0  Other iron deficiency anemia -     ACCRUFeR; Take 1 capsule (30 mg total) by mouth in the morning and at bedtime.  Dispense: 180 capsule; Refill: 1  Encounter for general adult medical examination with abnormal findings - Exam completed, labs reviewed, he refused vaccines, cancer screenings addressed, pt ed material was given.      Follow-up: Return in about 6 months (around 07/03/2023).  Sanda Linger, MD

## 2023-01-02 NOTE — Patient Instructions (Signed)
Health Maintenance, Male Adopting a healthy lifestyle and getting preventive care are important in promoting health and wellness. Ask your health care provider about: The right schedule for you to have regular tests and exams. Things you can do on your own to prevent diseases and keep yourself healthy. What should I know about diet, weight, and exercise? Eat a healthy diet  Eat a diet that includes plenty of vegetables, fruits, low-fat dairy products, and lean protein. Do not eat a lot of foods that are high in solid fats, added sugars, or sodium. Maintain a healthy weight Body mass index (BMI) is a measurement that can be used to identify possible weight problems. It estimates body fat based on height and weight. Your health care provider can help determine your BMI and help you achieve or maintain a healthy weight. Get regular exercise Get regular exercise. This is one of the most important things you can do for your health. Most adults should: Exercise for at least 150 minutes each week. The exercise should increase your heart rate and make you sweat (moderate-intensity exercise). Do strengthening exercises at least twice a week. This is in addition to the moderate-intensity exercise. Spend less time sitting. Even light physical activity can be beneficial. Watch cholesterol and blood lipids Have your blood tested for lipids and cholesterol at 54 years of age, then have this test every 5 years. You may need to have your cholesterol levels checked more often if: Your lipid or cholesterol levels are high. You are older than 54 years of age. You are at high risk for heart disease. What should I know about cancer screening? Many types of cancers can be detected early and may often be prevented. Depending on your health history and family history, you may need to have cancer screening at various ages. This may include screening for: Colorectal cancer. Prostate cancer. Skin cancer. Lung  cancer. What should I know about heart disease, diabetes, and high blood pressure? Blood pressure and heart disease High blood pressure causes heart disease and increases the risk of stroke. This is more likely to develop in people who have high blood pressure readings or are overweight. Talk with your health care provider about your target blood pressure readings. Have your blood pressure checked: Every 3-5 years if you are 18-39 years of age. Every year if you are 40 years old or older. If you are between the ages of 65 and 75 and are a current or former smoker, ask your health care provider if you should have a one-time screening for abdominal aortic aneurysm (AAA). Diabetes Have regular diabetes screenings. This checks your fasting blood sugar level. Have the screening done: Once every three years after age 45 if you are at a normal weight and have a low risk for diabetes. More often and at a younger age if you are overweight or have a high risk for diabetes. What should I know about preventing infection? Hepatitis B If you have a higher risk for hepatitis B, you should be screened for this virus. Talk with your health care provider to find out if you are at risk for hepatitis B infection. Hepatitis C Blood testing is recommended for: Everyone born from 1945 through 1965. Anyone with known risk factors for hepatitis C. Sexually transmitted infections (STIs) You should be screened each year for STIs, including gonorrhea and chlamydia, if: You are sexually active and are younger than 54 years of age. You are older than 54 years of age and your   health care provider tells you that you are at risk for this type of infection. Your sexual activity has changed since you were last screened, and you are at increased risk for chlamydia or gonorrhea. Ask your health care provider if you are at risk. Ask your health care provider about whether you are at high risk for HIV. Your health care provider  may recommend a prescription medicine to help prevent HIV infection. If you choose to take medicine to prevent HIV, you should first get tested for HIV. You should then be tested every 3 months for as long as you are taking the medicine. Follow these instructions at home: Alcohol use Do not drink alcohol if your health care provider tells you not to drink. If you drink alcohol: Limit how much you have to 0-2 drinks a day. Know how much alcohol is in your drink. In the [place] one drink equals one 12 oz bottle of beer (355 mL), one 5 oz glass of wine (148 mL), or one 1 oz glass of hard liquor (44 mL). Lifestyle Do not use any products that contain nicotine or tobacco. These products include cigarettes, chewing tobacco, and vaping devices, such as e-cigarettes. If you need help quitting, ask your health care provider. Do not use street drugs. Do not share needles. Ask your health care provider for help if you need support or information about quitting drugs. General instructions Schedule regular health, dental, and eye exams. Stay current with your vaccines. Tell your health care provider if: You often feel depressed. You have ever been abused or do not feel safe at home. Summary Adopting a healthy lifestyle and getting preventive care are important in promoting health and wellness. Follow your health care provider's instructions about healthy diet, exercising, and getting tested or screened for diseases. Follow your health care provider's instructions on monitoring your cholesterol and blood pressure. This information is not intended to replace advice given to you by your health care provider. Make sure you discuss any questions you have with your health care provider. Document Revised: 07/12/2020 Document Reviewed: 07/12/2020 Elsevier Patient Education  2024 [employer]  

## 2023-01-03 LAB — URINALYSIS, ROUTINE W REFLEX MICROSCOPIC
Bilirubin Urine: NEGATIVE
Hgb urine dipstick: NEGATIVE
Ketones, ur: NEGATIVE
Nitrite: NEGATIVE
RBC / HPF: NONE SEEN (ref 0–?)
Specific Gravity, Urine: 1.015 (ref 1.000–1.030)
Total Protein, Urine: NEGATIVE
Urine Glucose: NEGATIVE
Urobilinogen, UA: 0.2 (ref 0.0–1.0)
pH: 6.5 (ref 5.0–8.0)

## 2023-01-03 LAB — IBC + FERRITIN
Ferritin: 7.4 ng/mL — ABNORMAL LOW (ref 22.0–322.0)
Iron: 35 ug/dL — ABNORMAL LOW (ref 42–165)
Saturation Ratios: 8.3 % — ABNORMAL LOW (ref 20.0–50.0)
TIBC: 421.4 ug/dL (ref 250.0–450.0)
Transferrin: 301 mg/dL (ref 212.0–360.0)

## 2023-01-03 LAB — FOLATE: Folate: 15.9 ng/mL (ref >5.9)

## 2023-01-03 LAB — VITAMIN D 25 HYDROXY (VIT D DEFICIENCY, FRACTURES): VITD: 29.11 ng/mL — ABNORMAL LOW (ref 30.00–100.00)

## 2023-01-03 LAB — VITAMIN B12: Vitamin B-12: 569 pg/mL (ref 211–911)

## 2023-01-03 MED ORDER — ACCRUFER 30 MG PO CAPS
1.0000 | ORAL_CAPSULE | Freq: Two times a day (BID) | ORAL | 1 refills | Status: DC
Start: 2023-01-03 — End: 2023-06-22

## 2023-01-03 MED ORDER — OLMESARTAN MEDOXOMIL 20 MG PO TABS: 20.0000 mg | ORAL_TABLET | Freq: Every day | ORAL | 1 refills | Status: DC

## 2023-01-03 MED ORDER — CHOLECALCIFEROL 125 MCG (5000 UT) PO CAPS: 5000.0000 [IU] | ORAL_CAPSULE | Freq: Every day | ORAL | 1 refills | Status: DC

## 2023-01-07 ENCOUNTER — Encounter: Payer: Self-pay | Admitting: Internal Medicine

## 2023-01-09 DIAGNOSIS — M5416 Radiculopathy, lumbar region: Secondary | ICD-10-CM | POA: Diagnosis not present

## 2023-01-09 LAB — VITAMIN B1: Vitamin B1 (Thiamine): 18 nmol/L (ref 8–30)

## 2023-01-09 LAB — ZINC: Zinc: 48 ug/dL — ABNORMAL LOW (ref 60–130)

## 2023-01-10 ENCOUNTER — Other Ambulatory Visit: Payer: Self-pay | Admitting: Internal Medicine

## 2023-01-10 DIAGNOSIS — E60 Dietary zinc deficiency: Secondary | ICD-10-CM

## 2023-01-10 MED ORDER — ZINC GLUCONATE 50 MG PO TABS: 50.0000 mg | ORAL_TABLET | Freq: Every day | ORAL | 1 refills | Status: DC

## 2023-01-16 DIAGNOSIS — M25511 Pain in right shoulder: Secondary | ICD-10-CM | POA: Diagnosis not present

## 2023-01-16 DIAGNOSIS — M5416 Radiculopathy, lumbar region: Secondary | ICD-10-CM | POA: Diagnosis not present

## 2023-01-16 DIAGNOSIS — M545 Low back pain, unspecified: Secondary | ICD-10-CM | POA: Diagnosis not present

## 2023-01-16 DIAGNOSIS — G894 Chronic pain syndrome: Secondary | ICD-10-CM | POA: Diagnosis not present

## 2023-02-15 DIAGNOSIS — M5416 Radiculopathy, lumbar region: Secondary | ICD-10-CM | POA: Diagnosis not present

## 2023-03-02 ENCOUNTER — Encounter: Payer: Self-pay | Admitting: Internal Medicine

## 2023-03-05 ENCOUNTER — Other Ambulatory Visit: Payer: Self-pay | Admitting: Internal Medicine

## 2023-03-05 DIAGNOSIS — H00015 Hordeolum externum left lower eyelid: Secondary | ICD-10-CM | POA: Insufficient documentation

## 2023-03-05 MED ORDER — SULFAMETHOXAZOLE-TRIMETHOPRIM 800-160 MG PO TABS: 1.0000 | ORAL_TABLET | Freq: Two times a day (BID) | ORAL | 0 refills | Status: AC

## 2023-03-14 DIAGNOSIS — M5416 Radiculopathy, lumbar region: Secondary | ICD-10-CM | POA: Diagnosis not present

## 2023-03-19 ENCOUNTER — Other Ambulatory Visit: Payer: Self-pay | Admitting: Internal Medicine

## 2023-03-19 DIAGNOSIS — E66812 Obesity, class 2: Secondary | ICD-10-CM

## 2023-03-27 ENCOUNTER — Other Ambulatory Visit: Payer: Self-pay | Admitting: Internal Medicine

## 2023-03-27 ENCOUNTER — Encounter: Payer: Self-pay | Admitting: Dermatology

## 2023-03-27 ENCOUNTER — Ambulatory Visit: Payer: Medicare Other | Admitting: Dermatology

## 2023-03-27 VITALS — BP 122/80 | HR 68

## 2023-03-27 DIAGNOSIS — R229 Localized swelling, mass and lump, unspecified: Secondary | ICD-10-CM

## 2023-03-27 DIAGNOSIS — R2242 Localized swelling, mass and lump, left lower limb: Secondary | ICD-10-CM

## 2023-03-27 DIAGNOSIS — E66812 Obesity, class 2: Secondary | ICD-10-CM

## 2023-03-27 NOTE — Patient Instructions (Addendum)
Please call [phone] to schedule your imaging study   Important Information   Due to recent changes in healthcare laws, you may see results of your pathology and/or laboratory studies on MyChart before the doctors have had a chance to review them. We understand that in some cases there may be results that are confusing or concerning to you. Please understand that not all results are received at the same time and often the doctors may need to interpret multiple results in order to provide you with the best plan of care or course of treatment. Therefore, we ask that you please give [place] 2 business days to thoroughly review all your results before contacting the office for clarification. Should we see a critical lab result, you will be contacted sooner.     If You Need Anything After Your Visit   If you have any questions or concerns for your doctor, please call our main line at [phone]. If no one answers, please leave a voicemail as directed and we will return your call as soon as possible. Messages left after 4 pm will be answered the following business day.    You may also send [place] a message via MyChart. We typically respond to MyChart messages within 1-2 business days.  For prescription refills, please ask your pharmacy to contact our office. Our fax number is [phone].  If you have an urgent issue when the clinic is closed that cannot wait until the next business day, you can page your doctor at the number below.     Please note that while we do our best to be available for urgent issues outside of office hours, we are not available 24/7.    If you have an urgent issue and are unable to reach [place], you may choose to seek medical care at your doctor's office, retail clinic, urgent care center, or emergency room.   If you have a medical emergency, please immediately call 911 or go to the emergency department. In the event of inclement weather, please call our main line at [phone]  for an update on the status of any delays or closures.  Dermatology Medication Tips: Please keep the boxes that topical medications come in in order to help keep track of the instructions about where and how to use these. Pharmacies typically print the medication instructions only on the boxes and not directly on the medication tubes.   If your medication is too expensive, please contact our office at [phone] or send [place] a message through MyChart.    We are unable to tell what your co-pay for medications will be in advance as this is different depending on your insurance coverage. However, we may be able to find a substitute medication at lower cost or fill out paperwork to get insurance to cover a needed medication.    If a prior authorization is required to get your medication covered by your insurance company, please allow [place] 1-2 business days to complete this process.   Drug prices often vary depending on where the prescription is filled and some pharmacies may offer cheaper prices.   The website www.goodrx.com contains coupons for medications through different pharmacies. The prices here do not account for what the cost may be with help from insurance (it may be cheaper with your insurance), but the website can give you the price if you did not use any insurance.  - You can print the associated coupon and take it with your prescription to the pharmacy.  -  You may also stop by our office during regular business hours and pick up a GoodRx coupon card.  - If you need your prescription sent electronically to a different pharmacy, notify our office through [facility] or by phone at [phone]

## 2023-03-27 NOTE — Progress Notes (Signed)
   New Patient Visit   Subjective  Bradley [name] is a 53 y.o. male who presents for the following: growth on left knee. 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  Pt has growth on left knee that has been present around a year that has grown in the last 6 months. Pt has no hx of skin cancer, no family hx of skin cancer.  The following portions of the chart were reviewed this encounter and updated as appropriate: medications, allergies, medical history  Review of Systems:  No other skin or systemic complaints except as noted in HPI or Assessment and Plan.  Objective  Well appearing patient in no apparent distress; mood and affect are within normal limits.  A focused examination was performed of the following areas: Left knee  Relevant exam findings are noted in the Assessment and Plan.    Assessment & Plan   Subcutaneous Nodule of the left knee- Lipoma vs cyst vs Joint Space Pocket (Synovial Fluid Cyst) Exam: Subcutaneous rubbery nodule(s) Location: left knee  The patient presents with a subcutaneous nodule on the knee, which we discussed in detail. Given its location and appearance, one possibility is a synovial cyst, which typically forms in response to joint irritation or injury and can often be felt as a soft, fluid-filled lump near the knee joint. However, we cannot rule out other potential diagnoses without further evaluation. I have recommended performing an ultrasound to assess the characteristics of the nodule and confirm whether it is indeed a synovial cyst or if another underlying cause is contributing to the mass. The ultrasound will help determine the size, shape, and content of the nodule, allowing [place] to make a more accurate diagnosis and plan further management accordingly. Once the ultrasound results are available, we will reassess and discuss the next steps, which may include observation, or excision, or other interventions if necessary. SUBCUTANEOUS  NODULE   Related Procedures [place] LT LOWER EXTREM LTD SOFT TISSUE NON VASCULAR  Return based on imaging results.  I, Tillie Fantasia, CMA, am acting as scribe for Gwenith Daily, MD.   Documentation: I have reviewed the above documentation for accuracy and completeness, and I agree with the above.  Gwenith Daily, MD

## 2023-03-28 ENCOUNTER — Other Ambulatory Visit: Payer: Self-pay | Admitting: Internal Medicine

## 2023-04-02 ENCOUNTER — Ambulatory Visit (HOSPITAL_BASED_OUTPATIENT_CLINIC_OR_DEPARTMENT_OTHER)
Admission: RE | Admit: 2023-04-02 | Discharge: 2023-04-02 | Disposition: A | Discharge status: Home / Self Care | Payer: Medicare Other | Source: Ambulatory Visit | Attending: Dermatology | Admitting: Dermatology

## 2023-04-02 DIAGNOSIS — R229 Localized swelling, mass and lump, unspecified: Secondary | ICD-10-CM | POA: Insufficient documentation

## 2023-04-02 DIAGNOSIS — R2242 Localized swelling, mass and lump, left lower limb: Secondary | ICD-10-CM | POA: Diagnosis not present

## 2023-04-10 ENCOUNTER — Other Ambulatory Visit: Payer: Self-pay | Admitting: Dermatology

## 2023-04-10 DIAGNOSIS — M719 Bursopathy, unspecified: Secondary | ICD-10-CM

## 2023-04-12 ENCOUNTER — Other Ambulatory Visit: Payer: Self-pay

## 2023-04-12 DIAGNOSIS — M719 Bursopathy, unspecified: Secondary | ICD-10-CM

## 2023-04-13 NOTE — Progress Notes (Signed)
 [facility] [address] [place] [address] Phone: [phone] Subjective:   IBryan Caprio, am serving as a scribe for Dr. Ronnell Coins.  I'm seeing this patient by the request  of:  Arcadio Knuckles, MD  CC: Left foot pain  BJY:[id number]  REDGE HARTT is a 53 y.o. male coming in with complaint of L foot pain. L ankle pain for about 1 month. Pain across top of foot. Ice and elevates foot at night.     Patient did have an ultrasound done on his left knee that showed that he did have what appeared to be a prepatellar bursitis.  Sent here for further evaluation.  Past Medical History:  Diagnosis Date   Allergy    Anxiety    Aortic valve sclerosis    Arthritis    Arthropathy, unspecified, other specified sites    CAD (coronary artery disease)    01/09/20: patient denied; faint coronary calcifications on 2016 CT   Depression    GERD (gastroesophageal reflux disease)    Heart murmur    mild MR, mild AS 06/2019 (Dr. Ola Berger)   History of kidney stones    Hypertension     " no longer have hypertension since gastric bypass "   Obesity, unspecified    Other, mixed, or unspecified nondependent drug abuse, unspecified    Personal history of urinary calculi    Priapism    Past Surgical History:  Procedure Laterality Date   ANKLE SURGERY Left 2003   ANTERIOR LAT LUMBAR FUSION Left 02/24/2016   Procedure: LEFT LUMBAR TWO-THREE  ANTERIOR LATERAL LUMBAR FUSION  ;  Surgeon: Agustina Aldrich, MD;  Location[facility] OR;  Service: Neurosurgery;  Laterality: Left;   ANTERIOR LATERAL LUMBAR FUSION WITH PERCUTANEOUS SCREW 1 LEVEL Right 01/13/2020   Procedure: RIGHT LUMBAR ONE-TWO ANTERIOR LATERAL LUMBAR FUSION WITH PERCUTANEOUS SCREW PLACEMENT;  Surgeon: Agustina Aldrich, MD;  Location: [facility] OR;  Service: Neurosurgery;  Laterality: Right;  3C   CHOLECYSTECTOMY N/A 01/16/2022   Procedure: LAPAROSCOPIC CHOLECYSTECTOMY WITH INTRAOPERATIVE CHOLANGIOGRAM AND ICG DYE;   Surgeon: Aldean Hummingbird, MD;  Location: [facility] ORS;  Service: General;  Laterality: N/A;   GASTRIC BYPASS  2008   HERNIA REPAIR Left 2005   groin   INCISION AND DRAINAGE ABSCESS Right 07/06/2004   great toe   IRRIGATION AND DEBRIDEMENT ABSCESS Right 07/08/2004   great toe   KNEE ARTHROPLASTY Right 04/26/2017   Procedure: RIGHT TOTAL KNEE ARTHROPLASTY WITH COMPUTER NAVIGATION;  Surgeon: Adonica Hoose, MD;  Location: [facility] ORS;  Service: Orthopedics;  Laterality: Right;  Needs RNFA   KNEE SURGERY Right 2004   LUMBAR PERCUTANEOUS PEDICLE SCREW 1 LEVEL Left 02/24/2016   Procedure: LUMBAR PERCUTANEOUS PEDICLE SCREW 1 LEVEL;  Surgeon: Agustina Aldrich, MD;  Location: [facility] OR;  Service: Neurosurgery;  Laterality: Left;   MICRODISCECTOMY LUMBAR Left 08/24/2008   T12 - L1   ORIF HUMERUS FRACTURE Right 04/17/2022   Procedure: OPEN REDUCTION INTERNAL FIXATION (ORIF) RIGHT DISTAL HUMERUS FRACTURE;  Surgeon: Micheline Ahr, MD;  Location: [facility] OR;  Service: Orthopedics;  Laterality: Right;   panectomy  2010   TOTAL KNEE ARTHROPLASTY Right 04/2017   Social History   Socioeconomic History   Marital status: Married    Spouse name: Glenora Laos   Number of children: 3   Years of education: high school   Highest education level: GED or equivalent  Occupational History   Occupation: disabled  Tobacco Use   Smoking status:  Never   Smokeless tobacco: Never  Vaping Use   Vaping status: Never Used  Substance and Sexual Activity   Alcohol  use: No   Drug use: No   Sexual activity: Yes  Other Topics Concern   Not on file  Social History Narrative   Lives with wife.   Social Drivers of Corporate investment banker Strain: Low Risk  (12/29/2022)   Overall Financial Resource Strain (CARDIA)    Difficulty of Paying Living Expenses: Not hard at all  Food Insecurity: No Food Insecurity (12/29/2022)   Hunger Vital Sign    Worried About Running Out of Food in the Last Year: Never true    Ran Out of Food in the Last Year:  Never true  Transportation Needs: No Transportation Needs (12/29/2022)   PRAPARE - Administrator, Civil Service (Medical): No    Lack of Transportation (Non-Medical): No  Physical Activity: Sufficiently Active (12/29/2022)   Exercise Vital Sign    Days of Exercise per Week: 4 days    Minutes of Exercise per Session: 60 min  Recent Concern: Physical Activity - Insufficiently Active (12/18/2022)   Exercise Vital Sign    Days of Exercise per Week: 3 days    Minutes of Exercise per Session: 20 min  Stress: No Stress Concern Present (12/29/2022)   [employer] of Occupational Health - Occupational Stress Questionnaire    Feeling of Stress : Only a little  Social Connections: Moderately Integrated (12/29/2022)   Social Connection and Isolation Panel [NHANES]    Frequency of Communication with Friends and Family: Once a week    Frequency of Social Gatherings with Friends and Family: Once a week    Attends Religious Services: 1 to 4 times per year    Active Member of [employer] or Organizations: Yes    Attends Banker Meetings: 1 to 4 times per year    Marital Status: Married   Allergies  Allergen Reactions   Codeine Hives and Itching   Nsaids Other (See Comments)    H/o gastric bypass - can cause ulcers   Family History  Problem Relation Age of Onset   Arthritis Mother    Colon cancer Mother    Breast cancer Mother        twice   Kidney failure Mother    Heart disease Father    Hypertension Father    Esophageal cancer Neg Hx    Inflammatory bowel disease Neg Hx    Liver disease Neg Hx    Pancreatic cancer Neg Hx    Stomach cancer Neg Hx    Rectal cancer Neg Hx      Current Outpatient Medications (Cardiovascular):    olmesartan  (BENICAR ) 20 MG tablet, Take 1 tablet (20 mg total) by mouth daily.   Current Outpatient Medications (Analgesics):    acetaminophen  (TYLENOL ) 325 MG tablet, Take 2 tablets (650 mg total) by mouth 4 (four) times daily.    morphine  (MS CONTIN ) 30 MG 12 hr tablet, Take 1 tablet (30 mg total) by mouth every 12 (twelve) hours.   Oxycodone  HCl 10 MG TABS, Take 10 mg by mouth.  Current Outpatient Medications (Hematological):    Ferric Maltol  (ACCRUFER ) 30 MG CAPS, Take 1 capsule (30 mg total) by mouth in the morning and at bedtime.   folic acid  (FOLVITE ) 1 MG tablet, TAKE 1 TABLET(1 MG) BY MOUTH DAILY (Patient not taking: Reported on 12/18/2022)  Current Outpatient Medications (Other):    Baclofen  5 MG  TABS, Take 1 tablet (5 mg total) by mouth 3 (three) times daily.   Cholecalciferol  125 MCG (5000 UT) capsule, Take 1 capsule (5,000 Units total) by mouth daily.   DULoxetine  (CYMBALTA ) 20 MG capsule, Take 20 mg by mouth 2 (two) times daily.   gabapentin  (NEURONTIN ) 600 MG tablet, Take 1.5 tablets (900 mg total) by mouth 3 (three) times daily.   magnesium  hydroxide (MILK OF MAGNESIA) 400 MG/5ML suspension, Take 30 mLs by mouth daily. (Patient taking differently: Take 30 mLs by mouth daily as needed.)   Multiple Vitamin (MULTIVITAMIN WITH MINERALS) TABS tablet, Take 1 tablet by mouth daily.   phentermine  (ADIPEX-P ) 37.5 MG tablet, TAKE 1 TABLET(37.5 MG) BY MOUTH DAILY BEFORE BREAKFAST   tamsulosin  (FLOMAX ) 0.4 MG CAPS capsule, TAKE 2 CAPSULES(0.8 MG) BY MOUTH DAILY AFTER SUPPER   zinc  gluconate 50 MG tablet, Take 1 tablet (50 mg total) by mouth daily.   Reviewed prior external information including notes and imaging from  primary care provider As well as notes that were available from care everywhere and other healthcare systems.  Past medical history, social, surgical and family history all reviewed in electronic medical record.  No pertanent information unless stated regarding to the chief complaint.   Review of Systems:  No headache, visual changes, nausea, vomiting, diarrhea, constipation, dizziness, abdominal pain, skin rash, fevers, chills, night sweats, weight loss, swollen lymph nodes, body aches, joint  swelling, chest pain, shortness of breath, mood changes. POSITIVE muscle aches  Objective  Blood pressure (!) 122/90, pulse 80, height 5\' 10"  (1.778 m), weight 268 lb (121.6 kg), SpO2 95%.   General: No apparent distress alert and oriented x3 mood and affect normal, dressed appropriately.  HEENT: Pupils equal, extraocular movements intact  Respiratory: Patient's speak in full sentences and does not appear short of breath  Cardiovascular: No lower extremity edema, non tender, no erythema  Knee exam shows does have swelling over the anterior aspect of the knee.  Seems to be more of a cyst noted over the tibial plateau.  Foot exam does have tightness noted Severely antalgic gait noted  Limited muscular skeletal ultrasound was performed and interpreted by Ronnell Coins, M  Knee exam has what appears to be a cyst noted in a potential rupture of it that does seem to be going more distally down the superficial aspect of the patellar tendon.  Foot exam only shows the patient does have some arthritic changes of the midfoot noted.  Impression: Likely ganglion cyst of the superficial still potentially draining and arthritic changes of the midfoot    Impression and Recommendations:     The above documentation has been reviewed and is accurate and complete Allisha Harter M Shamond Skelton, DO

## 2023-04-16 ENCOUNTER — Ambulatory Visit: Payer: Medicare Other | Admitting: Family Medicine

## 2023-04-16 ENCOUNTER — Other Ambulatory Visit: Payer: Self-pay

## 2023-04-16 ENCOUNTER — Encounter: Payer: Self-pay | Admitting: Family Medicine

## 2023-04-16 VITALS — BP 122/90 | HR 80 | Ht 70.0 in | Wt 268.0 lb

## 2023-04-16 DIAGNOSIS — M79672 Pain in left foot: Secondary | ICD-10-CM | POA: Insufficient documentation

## 2023-04-16 DIAGNOSIS — M25462 Effusion, left knee: Secondary | ICD-10-CM | POA: Insufficient documentation

## 2023-04-16 NOTE — Assessment & Plan Note (Signed)
 Multifactorial.  Seems to have some midfoot arthritis.  In addition to this though I do think the patient has some soft tissue changes that is consistent with the previous injury and maybe the fall.  We discussed with patient that advanced imaging could be warranted.  The patient states that he would like to not do that at the moment.  We discussed with him leaving on a cruise in the near future I did feel a cam walker was the most beneficial to calm it down at the moment.  Discussed icing regimen and home exercises otherwise.  When patient comes back if doing better we will advance accordingly and if worse we do need to consider the advanced imaging discussed today.

## 2023-04-16 NOTE — Patient Instructions (Signed)
 You have 14 days to return or exchange your brace Call [phone], then return the brace to our office  [facility] sandals in the house See you again in 6 weeks after cruise

## 2023-04-16 NOTE — Assessment & Plan Note (Signed)
 No sign of any infectious etiology.  Likely more secondary to the previous injury.  We did discuss the possibility of aspiration the patient wants to continue to monitor.  Feels like it has been muscular.  Discussed heat and massage in the area for now.

## 2023-04-17 ENCOUNTER — Encounter: Payer: Self-pay | Admitting: Internal Medicine

## 2023-04-21 ENCOUNTER — Other Ambulatory Visit: Payer: Self-pay | Admitting: Internal Medicine

## 2023-05-16 DIAGNOSIS — G894 Chronic pain syndrome: Secondary | ICD-10-CM | POA: Diagnosis not present

## 2023-05-16 DIAGNOSIS — M4310 Spondylolisthesis, site unspecified: Secondary | ICD-10-CM | POA: Diagnosis not present

## 2023-05-16 DIAGNOSIS — M5416 Radiculopathy, lumbar region: Secondary | ICD-10-CM | POA: Diagnosis not present

## 2023-05-16 DIAGNOSIS — M545 Low back pain, unspecified: Secondary | ICD-10-CM | POA: Diagnosis not present

## 2023-05-16 DIAGNOSIS — M961 Postlaminectomy syndrome, not elsewhere classified: Secondary | ICD-10-CM | POA: Diagnosis not present

## 2023-05-28 ENCOUNTER — Other Ambulatory Visit: Payer: Self-pay

## 2023-05-28 ENCOUNTER — Ambulatory Visit (INDEPENDENT_AMBULATORY_CARE_PROVIDER_SITE_OTHER)

## 2023-05-28 ENCOUNTER — Ambulatory Visit: Payer: Medicare Other | Admitting: Family Medicine

## 2023-05-28 VITALS — BP 130/88 | HR 66 | Ht 70.0 in

## 2023-05-28 DIAGNOSIS — M2550 Pain in unspecified joint: Secondary | ICD-10-CM

## 2023-05-28 DIAGNOSIS — M79672 Pain in left foot: Secondary | ICD-10-CM

## 2023-05-28 DIAGNOSIS — M19072 Primary osteoarthritis, left ankle and foot: Secondary | ICD-10-CM | POA: Diagnosis not present

## 2023-05-28 MED ORDER — KETOROLAC TROMETHAMINE 60 MG/2ML IM SOLN
60.0000 mg | Freq: Once | INTRAMUSCULAR | Status: AC
  Administered 2023-05-28: 60 mg via INTRAMUSCULAR

## 2023-05-28 MED ORDER — METHYLPREDNISOLONE ACETATE 80 MG/ML IJ SUSP
80.0000 mg | Freq: Once | INTRAMUSCULAR | Status: AC
  Administered 2023-05-28: 80 mg via INTRAMUSCULAR

## 2023-05-28 NOTE — Progress Notes (Unsigned)
 [facility] [address] [place] [address] Phone: [phone] Subjective:   Bradley [name], am serving as a scribe for Dr. Antoine Primas.  I'm seeing this patient by the request  of:  Etta Grandchild, MD  CC: Foot pain, knee pain follow-up  BJY:NWGNFAOZHY  04/16/2023 Multifactorial.  Seems to have some midfoot arthritis.  In addition to this though I do think the patient has some soft tissue changes that is consistent with the previous injury and maybe the fall.  We discussed with patient that advanced imaging could be warranted.  The patient states that he would like to not do that at the moment.  We discussed with him leaving on a cruise in the near future I did feel a cam walker was the most beneficial to calm it down at the moment.  Discussed icing regimen and home exercises otherwise.  When patient comes back if doing better we will advance accordingly and if worse we do need to consider the advanced imaging discussed today.     Updated 05/28/2023 Bradley [name] is a 53 y.o. male coming in with complaint of L foot pain. Doing a little better. When not in boot can still feel it some.      Past Medical History:  Diagnosis Date   Allergy    Anxiety    Aortic valve sclerosis    Arthritis    Arthropathy, unspecified, other specified sites    CAD (coronary artery disease)    01/09/20: patient denied; faint coronary calcifications on 2016 CT   Depression    GERD (gastroesophageal reflux disease)    Heart murmur    mild MR, mild AS 06/2019 (Dr. Dietrich Pates)   History of kidney stones    Hypertension     " no longer have hypertension since gastric bypass "   Obesity, unspecified    Other, mixed, or unspecified nondependent drug abuse, unspecified    Personal history of urinary calculi    Priapism    Past Surgical History:  Procedure Laterality Date   ANKLE SURGERY Left 2003   ANTERIOR LAT LUMBAR FUSION Left 02/24/2016   Procedure: LEFT  LUMBAR TWO-THREE  ANTERIOR LATERAL LUMBAR FUSION  ;  Surgeon: Julio Sicks, MD;  Location: [facility] OR;  Service: Neurosurgery;  Laterality: Left;   ANTERIOR LATERAL LUMBAR FUSION WITH PERCUTANEOUS SCREW 1 LEVEL Right 01/13/2020   Procedure: RIGHT LUMBAR ONE-TWO ANTERIOR LATERAL LUMBAR FUSION WITH PERCUTANEOUS SCREW PLACEMENT;  Surgeon: Julio Sicks, MD;  Location: [facility] OR;  Service: Neurosurgery;  Laterality: Right;  3C   CHOLECYSTECTOMY N/A 01/16/2022   Procedure: LAPAROSCOPIC CHOLECYSTECTOMY WITH INTRAOPERATIVE CHOLANGIOGRAM AND ICG DYE;  Surgeon: Gaynelle Adu, MD;  Location: [facility] ORS;  Service: General;  Laterality: N/A;   GASTRIC BYPASS  2008   HERNIA REPAIR Left 2005   groin   INCISION AND DRAINAGE ABSCESS Right 07/06/2004   great toe   IRRIGATION AND DEBRIDEMENT ABSCESS Right 07/08/2004   great toe   KNEE ARTHROPLASTY Right 04/26/2017   Procedure: RIGHT TOTAL KNEE ARTHROPLASTY WITH COMPUTER NAVIGATION;  Surgeon: Samson Frederic, MD;  Location: [facility] ORS;  Service: Orthopedics;  Laterality: Right;  Needs RNFA   KNEE SURGERY Right 2004   LUMBAR PERCUTANEOUS PEDICLE SCREW 1 LEVEL Left 02/24/2016   Procedure: LUMBAR PERCUTANEOUS PEDICLE SCREW 1 LEVEL;  Surgeon: Julio Sicks, MD;  Location: [facility] OR;  Service: Neurosurgery;  Laterality: Left;   MICRODISCECTOMY LUMBAR Left 08/24/2008   T12 - L1  ORIF HUMERUS FRACTURE Right 04/17/2022   Procedure: OPEN REDUCTION INTERNAL FIXATION (ORIF) RIGHT DISTAL HUMERUS FRACTURE;  Surgeon: Bjorn Pippin, MD;  Location: [facility] OR;  Service: Orthopedics;  Laterality: Right;   panectomy  2010   TOTAL KNEE ARTHROPLASTY Right 04/2017   Social History   Socioeconomic History   Marital status: Married    Spouse name: Aggie Cosier   Number of children: 3   Years of education: high school   Highest education level: GED or equivalent  Occupational History   Occupation: disabled  Tobacco Use   Smoking status: Never   Smokeless tobacco: Never  Vaping Use   Vaping status: Never Used   Substance and Sexual Activity   Alcohol use: No   Drug use: No   Sexual activity: Yes  Other Topics Concern   Not on file  Social History Narrative   Lives with wife.   Social Drivers of Corporate investment banker Strain: Low Risk  (12/29/2022)   Overall Financial Resource Strain (CARDIA)    Difficulty of Paying Living Expenses: Not hard at all  Food Insecurity: No Food Insecurity (12/29/2022)   Hunger Vital Sign    Worried About Running Out of Food in the Last Year: Never true    Ran Out of Food in the Last Year: Never true  Transportation Needs: No Transportation Needs (12/29/2022)   PRAPARE - Administrator, Civil Service (Medical): No    Lack of Transportation (Non-Medical): No  Physical Activity: Sufficiently Active (12/29/2022)   Exercise Vital Sign    Days of Exercise per Week: 4 days    Minutes of Exercise per Session: 60 min  Recent Concern: Physical Activity - Insufficiently Active (12/18/2022)   Exercise Vital Sign    Days of Exercise per Week: 3 days    Minutes of Exercise per Session: 20 min  Stress: No Stress Concern Present (12/29/2022)   [employer] of Occupational Health - Occupational Stress Questionnaire    Feeling of Stress : Only a little  Social Connections: Moderately Integrated (12/29/2022)   Social Connection and Isolation Panel [NHANES]    Frequency of Communication with Friends and Family: Once a week    Frequency of Social Gatherings with Friends and Family: Once a week    Attends Religious Services: 1 to 4 times per year    Active Member of [employer] or Organizations: Yes    Attends Banker Meetings: 1 to 4 times per year    Marital Status: Married   Allergies  Allergen Reactions   Codeine Hives and Itching   Nsaids Other (See Comments)    H/o gastric bypass - can cause ulcers   Family History  Problem Relation Age of Onset   Arthritis Mother    Colon cancer Mother    Breast cancer Mother        twice    Kidney failure Mother    Heart disease Father    Hypertension Father    Esophageal cancer Neg Hx    Inflammatory bowel disease Neg Hx    Liver disease Neg Hx    Pancreatic cancer Neg Hx    Stomach cancer Neg Hx    Rectal cancer Neg Hx      Current Outpatient Medications (Cardiovascular):    olmesartan (BENICAR) 20 MG tablet, Take 1 tablet (20 mg total) by mouth daily.   Current Outpatient Medications (Analgesics):    acetaminophen (TYLENOL) 325 MG tablet, Take 2 tablets (650 mg total) by  mouth 4 (four) times daily.   morphine (MS CONTIN) 30 MG 12 hr tablet, Take 1 tablet (30 mg total) by mouth every 12 (twelve) hours.   Oxycodone HCl 10 MG TABS, Take 10 mg by mouth.  Current Outpatient Medications (Hematological):    Ferric Maltol (ACCRUFER) 30 MG CAPS, Take 1 capsule (30 mg total) by mouth in the morning and at bedtime.   folic acid (FOLVITE) 1 MG tablet, TAKE 1 TABLET(1 MG) BY MOUTH DAILY (Patient not taking: Reported on 12/18/2022)  Current Outpatient Medications (Other):    Baclofen 5 MG TABS, Take 1 tablet (5 mg total) by mouth 3 (three) times daily.   Cholecalciferol 125 MCG (5000 UT) capsule, Take 1 capsule (5,000 Units total) by mouth daily.   DULoxetine (CYMBALTA) 20 MG capsule, Take 20 mg by mouth 2 (two) times daily.   gabapentin (NEURONTIN) 600 MG tablet, Take 1.5 tablets (900 mg total) by mouth 3 (three) times daily.   magnesium hydroxide (MILK OF MAGNESIA) 400 MG/5ML suspension, Take 30 mLs by mouth daily. (Patient taking differently: Take 30 mLs by mouth daily as needed.)   Multiple Vitamin (MULTIVITAMIN WITH MINERALS) TABS tablet, Take 1 tablet by mouth daily.   phentermine (ADIPEX-P) 37.5 MG tablet, TAKE 1 TABLET(37.5 MG) BY MOUTH DAILY BEFORE BREAKFAST   tamsulosin (FLOMAX) 0.4 MG CAPS capsule, TAKE 2 CAPSULES(0.8 MG) BY MOUTH DAILY AFTER SUPPER   zinc gluconate 50 MG tablet, Take 1 tablet (50 mg total) by mouth daily.   Reviewed prior external information  including notes and imaging from  primary care provider As well as notes that were available from care everywhere and other healthcare systems.  Past medical history, social, surgical and family history all reviewed in electronic medical record.  No pertanent information unless stated regarding to the chief complaint.   Review of Systems:  No headache, visual changes, nausea, vomiting, diarrhea, constipation, dizziness, abdominal pain, skin rash, fevers, chills, night sweats, weight loss, swollen lymph nodes, body aches, joint swelling, chest pain, shortness of breath, mood changes. POSITIVE muscle aches  Objective  There were no vitals taken for this visit.   General: No apparent distress alert and oriented x3 mood and affect normal, dressed appropriately.  HEENT: Pupils equal, extraocular movements intact  Respiratory: Patient's speak in full sentences and does not appear short of breath  Cardiovascular: No lower extremity edema, non tender, no erythema  Knee exam shows Foot exam shows     Impression and Recommendations:     The above documentation has been reviewed and is accurate and complete Judi Saa, DO

## 2023-05-28 NOTE — Assessment & Plan Note (Signed)
 Left leg exam still has swelling noted on ultrasound.  Seems to be the navicular cuneiform and also first MTP joint seems to be the worst.  Patient is still having some weakness of the large toe also noted.  Do feel at this point advanced imaging is warranted.  Will get x-rays but also get an MRI to further evaluate.  Discussed icing regimen and home exercises, which activities to do and which ones to avoid.  Increase activity slowly.  Follow-up after imaging for further evaluation.  Due to the severity of pain Toradol and Depo-Medrol given.  Patient is to avoid anti-inflammatories secondary to gastric sleeve but will consider using that on a more regular basis in 3-day burst if needed.

## 2023-05-28 NOTE — Patient Instructions (Addendum)
 Xray today [facility] [phone] Call Today  When we receive your results we will contact you.  Cocktail injection today

## 2023-05-29 ENCOUNTER — Encounter: Payer: Self-pay | Admitting: Family Medicine

## 2023-06-04 ENCOUNTER — Encounter: Payer: Self-pay | Admitting: Family Medicine

## 2023-06-10 ENCOUNTER — Ambulatory Visit
Admission: RE | Admit: 2023-06-10 | Discharge: 2023-06-10 | Disposition: A | Discharge status: Home / Self Care | Source: Ambulatory Visit | Attending: Family Medicine | Admitting: Family Medicine

## 2023-06-10 DIAGNOSIS — M19072 Primary osteoarthritis, left ankle and foot: Secondary | ICD-10-CM | POA: Diagnosis not present

## 2023-06-10 DIAGNOSIS — R600 Localized edema: Secondary | ICD-10-CM | POA: Diagnosis not present

## 2023-06-10 DIAGNOSIS — M79672 Pain in left foot: Secondary | ICD-10-CM | POA: Diagnosis not present

## 2023-06-10 DIAGNOSIS — M722 Plantar fascial fibromatosis: Secondary | ICD-10-CM | POA: Diagnosis not present

## 2023-06-10 DIAGNOSIS — M25572 Pain in left ankle and joints of left foot: Secondary | ICD-10-CM | POA: Diagnosis not present

## 2023-06-13 DIAGNOSIS — M5416 Radiculopathy, lumbar region: Secondary | ICD-10-CM | POA: Diagnosis not present

## 2023-06-20 ENCOUNTER — Encounter: Payer: Self-pay | Admitting: Family Medicine

## 2023-06-22 ENCOUNTER — Encounter: Payer: Self-pay | Admitting: Internal Medicine

## 2023-06-22 ENCOUNTER — Ambulatory Visit: Payer: Medicare Other | Attending: Cardiology | Admitting: Internal Medicine

## 2023-06-22 VITALS — BP 132/82 | HR 76 | Ht 70.0 in | Wt 268.0 lb

## 2023-06-22 DIAGNOSIS — M79662 Pain in left lower leg: Secondary | ICD-10-CM

## 2023-06-22 DIAGNOSIS — M7989 Other specified soft tissue disorders: Secondary | ICD-10-CM

## 2023-06-22 DIAGNOSIS — Z79899 Other long term (current) drug therapy: Secondary | ICD-10-CM | POA: Diagnosis not present

## 2023-06-22 DIAGNOSIS — I10 Essential (primary) hypertension: Secondary | ICD-10-CM

## 2023-06-22 DIAGNOSIS — I350 Nonrheumatic aortic (valve) stenosis: Secondary | ICD-10-CM

## 2023-06-22 MED ORDER — ROSUVASTATIN CALCIUM 10 MG PO TABS: 10.0000 mg | ORAL_TABLET | Freq: Every day | ORAL | 3 refills | Status: DC

## 2023-06-22 NOTE — Patient Instructions (Signed)
 Medication Instructions:  Your physician has recommended you make the following change in your medication:   1) START rosuvastatin  (Crestor ) 10 mg daily  *If you need a refill on your cardiac medications before your next appointment, please call your pharmacy*  Lab Work: In 8 weeks at [facility] (week of June 16th): LFTs, Lipomed profile, CK You may go to any of these [facility] locations:   [facility] - [address] ([facility]) - [address] [phone] [address]   [place] - [address]  - [address]   If you have labs (blood work) drawn today and your tests are completely normal, you will receive your results only by: [employer] (if you have MyChart) OR A paper copy in the mail If you have any lab test that is abnormal or we need to change your treatment, we will call you to review the results.  Testing/Procedures: Your physician has requested that you have an echocardiogram. Echocardiography is a painless test that uses sound waves to create images of your heart. It provides your doctor with information about the size and shape of your heart and how well your heart's chambers and valves are working. This procedure takes approximately one hour. There are no restrictions for this procedure. Please do NOT wear cologne, perfume, aftershave, or lotions (deodorant is allowed). Please arrive 15 minutes prior to your appointment time.  Please note: We ask at that you not bring children with you during ultrasound (echo/ vascular) testing. Due to room size and safety concerns, children are not allowed in the ultrasound rooms during exams. Our front office staff cannot provide observation of children in our lobby area while testing is being conducted. An adult accompanying a patient to their appointment will only be allowed in the ultrasound room at the discretion of the ultrasound  technician under special circumstances. We apologize for any inconvenience.  Follow-Up: At [facility], you and your health needs are our priority.  As part of our continuing mission to provide you with exceptional heart care, we have created designated Provider Care Teams.  These Care Teams include your primary Cardiologist (physician) and Advanced Practice Providers (APPs -  Physician Assistants and Nurse Practitioners) who all work together to provide you with the care you need, when you need it.  Your next appointment:   6-7 month(s)  The format for your next appointment:   In Person  Provider:   Ola Berger, MD {  Other Instructions   1st Floor: - Lobby - Registration  - Pharmacy  - Lab - Cafe  2nd Floor: - PV Lab - Diagnostic Testing (echo, CT, nuclear med)  3rd Floor: - Vacant  4th Floor: - TCTS (cardiothoracic surgery) - AFib Clinic - Structural Heart Clinic - Vascular Surgery  - Vascular Ultrasound  5th Floor: - [facility] (general and EP) - Clinical Pharmacy for coumadin, hypertension, lipid, weight-loss medications, and med management appointments    Valet parking services will be available as well.

## 2023-06-22 NOTE — Progress Notes (Unsigned)
 Cardiology Office Note   Date:  06/22/2023   ID:  Bradley [name], DOB 1971-01-11, MRN [id number]  PCP:  Bradley Knuckles, MD  Cardiologist:   Bradley Berger, MD   Pt presents for f/u of HTN   History of Present Illness: Bradley [name] is a 53 y.o. male with a history of HTN, obesity (s/p gastric bypass) and murmur  I saw him in 2016  Echo showed aortic sclerosis  CT did show coronary calcifications.    I last saw the pt in clinic in Spring  2022  Pt denies CP   Breathing is only short when Fe is low    No dizziness    Supposed to have back surgery on 03/11/21  I saw the pt in 2022 Since seen he fell through an attic  Broke arm, back   Had surgeries     Laid up  Lots of pain    '  Breakfast:   Coffee  Grits(occasionally) Lunch   Software engineer and veggies  Dinner  Same  Doing keto      Current Outpatient Medications  Medication Sig Dispense Refill   acetaminophen  (TYLENOL ) 325 MG tablet Take 2 tablets (650 mg total) by mouth 4 (four) times daily.     Baclofen  5 MG TABS Take 1 tablet (5 mg total) by mouth 3 (three) times daily. 90 tablet 0   DULoxetine  (CYMBALTA ) 20 MG capsule Take 20 mg by mouth 2 (two) times daily.     gabapentin  (NEURONTIN ) 600 MG tablet Take 1.5 tablets (900 mg total) by mouth 3 (three) times daily. 135 tablet 0   magnesium  hydroxide (MILK OF MAGNESIA) 400 MG/5ML suspension Take 30 mLs by mouth daily. (Patient taking differently: Take 30 mLs by mouth daily as needed.) 355 mL 0   morphine  (MS CONTIN ) 30 MG 12 hr tablet Take 1 tablet (30 mg total) by mouth every 12 (twelve) hours. 14 tablet 0   Multiple Vitamin (MULTIVITAMIN WITH MINERALS) TABS tablet Take 1 tablet by mouth daily. 30 tablet 0   olmesartan  (BENICAR ) 20 MG tablet Take 1 tablet (20 mg total) by mouth daily. 90 tablet 1   Oxycodone  HCl 10 MG TABS Take 10 mg by mouth.     No current facility-administered medications for this visit.    Allergies:   Codeine and Nsaids   Past Medical History:   Diagnosis Date   Allergy    Anxiety    Aortic valve sclerosis    Arthritis    Arthropathy, unspecified, other specified sites    CAD (coronary artery disease)    01/09/20: patient denied; faint coronary calcifications on 2016 CT   Depression    GERD (gastroesophageal reflux disease)    Heart murmur    mild MR, mild AS 06/2019 (Dr. Ola [name])   History of kidney stones    Hypertension     " no longer have hypertension since gastric bypass "   Obesity, unspecified    Other, mixed, or unspecified nondependent drug abuse, unspecified    Personal history of urinary calculi    Priapism     Past Surgical History:  Procedure Laterality Date   ANKLE SURGERY Left 2003   ANTERIOR LAT LUMBAR FUSION Left 02/24/2016   Procedure: LEFT LUMBAR TWO-THREE  ANTERIOR LATERAL LUMBAR FUSION  ;  Surgeon: Agustina Aldrich, MD;  Location: [facility] OR;  Service: Neurosurgery;  Laterality: Left;   ANTERIOR LATERAL LUMBAR FUSION WITH PERCUTANEOUS SCREW 1 LEVEL Right 01/13/2020  Procedure: RIGHT LUMBAR ONE-TWO ANTERIOR LATERAL LUMBAR FUSION WITH PERCUTANEOUS SCREW PLACEMENT;  Surgeon: Agustina Aldrich, MD;  Location: [facility] OR;  Service: Neurosurgery;  Laterality: Right;  3C   CHOLECYSTECTOMY N/A 01/16/2022   Procedure: LAPAROSCOPIC CHOLECYSTECTOMY WITH INTRAOPERATIVE CHOLANGIOGRAM AND ICG DYE;  Surgeon: Aldean Hummingbird, MD;  Location: [facility] ORS;  Service: General;  Laterality: N/A;   GASTRIC BYPASS  2008   HERNIA REPAIR Left 2005   groin   INCISION AND DRAINAGE ABSCESS Right 07/06/2004   great toe   IRRIGATION AND DEBRIDEMENT ABSCESS Right 07/08/2004   great toe   KNEE ARTHROPLASTY Right 04/26/2017   Procedure: RIGHT TOTAL KNEE ARTHROPLASTY WITH COMPUTER NAVIGATION;  Surgeon: Adonica Hoose, MD;  Location: [facility] ORS;  Service: Orthopedics;  Laterality: Right;  Needs RNFA   KNEE SURGERY Right 2004   LUMBAR PERCUTANEOUS PEDICLE SCREW 1 LEVEL Left 02/24/2016   Procedure: LUMBAR PERCUTANEOUS PEDICLE SCREW 1 LEVEL;  Surgeon: Agustina Aldrich, MD;  Location: [facility] OR;  Service: Neurosurgery;  Laterality: Left;   MICRODISCECTOMY LUMBAR Left 08/24/2008   T12 - L1   ORIF HUMERUS FRACTURE Right 04/17/2022   Procedure: OPEN REDUCTION INTERNAL FIXATION (ORIF) RIGHT DISTAL HUMERUS FRACTURE;  Surgeon: Micheline Ahr, MD;  Location: [facility] OR;  Service: Orthopedics;  Laterality: Right;   panectomy  2010   TOTAL KNEE ARTHROPLASTY Right 04/2017     Social History:  The patient  reports that he has never smoked. He has never used smokeless tobacco. He reports that he does not drink alcohol  and does not use drugs.   Family History:  The patient's family history includes Arthritis in his mother; Breast cancer in his mother; Colon cancer in his mother; Heart disease in his father; Hypertension in his father; Kidney failure in his mother.    ROS:  Please see the history of present illness. All other systems are reviewed and  Negative to the above problem except as noted.    PHYSICAL EXAM: VS:  BP (!) 139/90   Pulse 76   Ht 5\' 10"  (1.778 m)   Wt 268 lb (121.6 kg)   SpO2 96%   BMI 38.45 kg/m   GEN: Obese 53 yo in no acute distress  HEENT: normal  Neck: JVP normal  , no carotid bruits, Cardiac: RRR; Gr I/VI systolic murmur apex  No LE edema  Respiratory:  clear to auscultation bilaterally, GI: soft, nontender, nondistended, + BS  No hepatomegaly  MS: no deformity Moving all extremities   Skin: warm and dry, no rash Neuro:  Strength and sensation are intact Psych: euthymic mood, full affect   EKG:  EKG is  ordered today   NSR  Echo 2023  1. Normal LV function; calcified aortic valve with mild AS (mean gradient  13 mmHg and AVA 1.7 cm2).   2. Left ventricular ejection fraction, by estimation, is 60 to 65%. The  left ventricle has normal function. The left ventricle has no regional  wall motion abnormalities. There is mild concentric left ventricular  hypertrophy. Left ventricular diastolic  parameters are consistent with Grade II  diastolic dysfunction  (pseudonormalization).   3. Right ventricular systolic function is normal. The right ventricular  size is normal.   4. Left atrial size was moderately dilated.   5. The mitral valve is normal in structure. No evidence of mitral valve  regurgitation. No evidence of mitral stenosis.   6. The aortic valve is tricuspid. Aortic valve regurgitation is not  visualized. Mild aortic valve stenosis.  7. The inferior vena cava is normal in size with greater than 50%  respiratory variability, suggesting right atrial pressure of 3 mmHg.   Lipid Panel    Component Value Date/Time   CHOL 152 01/02/2023 1626   CHOL 138 07/05/2020 1358   TRIG 95.0 01/02/2023 1626   HDL 50.10 01/02/2023 1626   HDL 55 07/05/2020 1358   CHOLHDL 3 01/02/2023 1626   VLDL 19.0 01/02/2023 1626   LDLCALC 83 01/02/2023 1626   LDLCALC 71 07/05/2020 1358      Wt Readings from Last 3 Encounters:  06/22/23 268 lb (121.6 kg)  04/16/23 268 lb (121.6 kg)  01/02/23 269 lb 9.6 oz (122.3 kg)      ASSESSMENT AND PLAN  1  Dyspnea   I think it is more related to weight gain, surgeries and pain   HIs activity ahs been very limited       I would try to get Mounjaro covered   See if weight loss helps   Will also get echo  I would would not schedule ischemic testing now   2   CAD Coronary calcifications on CT in past   Again, I am not convinced dyspnea is anginal equivalent   He  was very active doing well until time of injury   Follow     3    Aortic stenosis  Last echo in 2023   Murmur is more mid peaking   WIll check another echo   3  HTN  BP on recheck 132/82  Follow for now   Work with weight hopefully    4   HL  Last LDL 75  With CAD I would start Crestor  10 mg   Follow up labs in 8 wks     Follow up in Fall  Signed, Mikhaila Roh, MD  06/22/2023 8:49 AM    [facility] [address] [place], [place], [place]  [address] Phone: [phone]; Fax: [phone]

## 2023-07-03 ENCOUNTER — Ambulatory Visit (HOSPITAL_COMMUNITY): Attending: Internal Medicine

## 2023-07-03 DIAGNOSIS — I350 Nonrheumatic aortic (valve) stenosis: Secondary | ICD-10-CM | POA: Diagnosis not present

## 2023-07-03 DIAGNOSIS — M5416 Radiculopathy, lumbar region: Secondary | ICD-10-CM | POA: Diagnosis not present

## 2023-07-05 LAB — ECHOCARDIOGRAM COMPLETE
AR max vel: 1.63 cm2
AV Area VTI: 1.61 cm2
AV Area mean vel: 1.65 cm2
AV Mean grad: 11.8 mmHg
AV Peak grad: 22.3 mmHg
Ao pk vel: 2.36 m/s
Calc EF: 70.8 %
S' Lateral: 2.86 cm
Single Plane A2C EF: 72.2 %
Single Plane A4C EF: 68.6 %

## 2023-07-09 ENCOUNTER — Encounter: Payer: Self-pay | Admitting: Internal Medicine

## 2023-07-10 ENCOUNTER — Other Ambulatory Visit: Payer: Self-pay | Admitting: Internal Medicine

## 2023-07-10 ENCOUNTER — Ambulatory Visit: Admitting: Internal Medicine

## 2023-07-10 ENCOUNTER — Ambulatory Visit (INDEPENDENT_AMBULATORY_CARE_PROVIDER_SITE_OTHER)

## 2023-07-10 VITALS — BP 122/78 | HR 67 | Temp 98.3°F | Ht 70.0 in | Wt 265.0 lb

## 2023-07-10 DIAGNOSIS — Z981 Arthrodesis status: Secondary | ICD-10-CM | POA: Diagnosis not present

## 2023-07-10 DIAGNOSIS — R058 Other specified cough: Secondary | ICD-10-CM | POA: Diagnosis not present

## 2023-07-10 DIAGNOSIS — E559 Vitamin D deficiency, unspecified: Secondary | ICD-10-CM | POA: Diagnosis not present

## 2023-07-10 DIAGNOSIS — R051 Acute cough: Secondary | ICD-10-CM | POA: Diagnosis not present

## 2023-07-10 DIAGNOSIS — S12600A Unspecified displaced fracture of seventh cervical vertebra, initial encounter for closed fracture: Secondary | ICD-10-CM | POA: Insufficient documentation

## 2023-07-10 DIAGNOSIS — R062 Wheezing: Secondary | ICD-10-CM | POA: Insufficient documentation

## 2023-07-10 DIAGNOSIS — I10 Essential (primary) hypertension: Secondary | ICD-10-CM | POA: Diagnosis not present

## 2023-07-10 DIAGNOSIS — R0602 Shortness of breath: Secondary | ICD-10-CM | POA: Diagnosis not present

## 2023-07-10 DIAGNOSIS — Z9884 Bariatric surgery status: Secondary | ICD-10-CM

## 2023-07-10 MED ORDER — LEVOFLOXACIN 500 MG PO TABS: 500.0000 mg | ORAL_TABLET | Freq: Every day | ORAL | 0 refills | Status: DC

## 2023-07-10 MED ORDER — ALBUTEROL SULFATE HFA 108 (90 BASE) MCG/ACT IN AERS: 2.0000 | INHALATION_SPRAY | Freq: Four times a day (QID) | RESPIRATORY_TRACT | 1 refills | Status: DC | PRN

## 2023-07-10 MED ORDER — PREDNISONE 10 MG PO TABS: ORAL_TABLET | ORAL | 0 refills | Status: DC

## 2023-07-10 MED ORDER — HYDROCODONE BIT-HOMATROP MBR 5-1.5 MG/5ML PO SOLN: 5.0000 mL | Freq: Four times a day (QID) | ORAL | 0 refills | Status: AC | PRN

## 2023-07-10 NOTE — Progress Notes (Signed)
 Patient ID: Bradley [name], male   DOB: 1971-03-02, 53 y.o.   MRN: [id number]        Chief Complaint: follow up cough, wheezing, htn, low vit d       HPI:  Bradley [name] is a 53 y.o. male Here with acute onset mild to mod 2-3 days ST, HA, general weakness and malaise, with prod cough greenish sputum with mild sob and wheeziness, but Pt denies chest pain, orthopnea, PND, increased LE swelling, palpitations, dizziness or syncope.   Pt denies polydipsia, polyuria, or new focal neuro s/s.    Pt denies recent wt loss, night sweats, loss of appetite, or other constitutional symptoms        Wt Readings from Last 3 Encounters:  07/10/23 265 lb (120.2 kg)  06/22/23 268 lb (121.6 kg)  04/16/23 268 lb (121.6 kg)   BP Readings from Last 3 Encounters:  07/10/23 122/78  06/22/23 132/82  05/28/23 130/88         Past Medical History:  Diagnosis Date   Allergy    Anxiety    Aortic valve sclerosis    Arthritis    Arthropathy, unspecified, other specified sites    CAD (coronary artery disease)    01/09/20: patient denied; faint coronary calcifications on 2016 CT   Depression    GERD (gastroesophageal reflux disease)    Heart murmur    mild MR, mild AS 06/2019 (Dr. Ola Berger)   History of kidney stones    Hypertension     " no longer have hypertension since gastric bypass "   Obesity, unspecified    Other, mixed, or unspecified nondependent drug abuse, unspecified    Personal history of urinary calculi    Priapism    Past Surgical History:  Procedure Laterality Date   ANKLE SURGERY Left 2003   ANTERIOR LAT LUMBAR FUSION Left 02/24/2016   Procedure: LEFT LUMBAR TWO-THREE  ANTERIOR LATERAL LUMBAR FUSION  ;  Surgeon: Agustina Aldrich, MD;  Location: [facility] OR;  Service: Neurosurgery;  Laterality: Left;   ANTERIOR LATERAL LUMBAR FUSION WITH PERCUTANEOUS SCREW 1 LEVEL Right 01/13/2020   Procedure: RIGHT LUMBAR ONE-TWO ANTERIOR LATERAL LUMBAR FUSION WITH PERCUTANEOUS SCREW PLACEMENT;  Surgeon: Agustina Aldrich,  MD;  Location: [facility] OR;  Service: Neurosurgery;  Laterality: Right;  3C   CHOLECYSTECTOMY N/A 01/16/2022   Procedure: LAPAROSCOPIC CHOLECYSTECTOMY WITH INTRAOPERATIVE CHOLANGIOGRAM AND ICG DYE;  Surgeon: Aldean Hummingbird, MD;  Location: [facility] ORS;  Service: General;  Laterality: N/A;   GASTRIC BYPASS  2008   HERNIA REPAIR Left 2005   groin   INCISION AND DRAINAGE ABSCESS Right 07/06/2004   great toe   IRRIGATION AND DEBRIDEMENT ABSCESS Right 07/08/2004   great toe   KNEE ARTHROPLASTY Right 04/26/2017   Procedure: RIGHT TOTAL KNEE ARTHROPLASTY WITH COMPUTER NAVIGATION;  Surgeon: Adonica Hoose, MD;  Location: [facility] ORS;  Service: Orthopedics;  Laterality: Right;  Needs RNFA   KNEE SURGERY Right 2004   LUMBAR PERCUTANEOUS PEDICLE SCREW 1 LEVEL Left 02/24/2016   Procedure: LUMBAR PERCUTANEOUS PEDICLE SCREW 1 LEVEL;  Surgeon: Agustina Aldrich, MD;  Location[facility] OR;  Service: Neurosurgery;  Laterality: Left;   MICRODISCECTOMY LUMBAR Left 08/24/2008   T12 - L1   ORIF HUMERUS FRACTURE Right 04/17/2022   Procedure: OPEN REDUCTION INTERNAL FIXATION (ORIF) RIGHT DISTAL HUMERUS FRACTURE;  Surgeon: Micheline Ahr, MD;  Location: [facility] OR;  Service: Orthopedics;  Laterality: Right;   panectomy  2010   TOTAL KNEE ARTHROPLASTY Right 04/2017  reports that he has never smoked. He has never used smokeless tobacco. He reports that he does not drink alcohol  and does not use drugs. family history includes Arthritis in his mother; Breast cancer in his mother; Colon cancer in his mother; Heart disease in his father; Hypertension in his father; Kidney failure in his mother. Allergies  Allergen Reactions   Codeine Hives and Itching   Nsaids Other (See Comments)    H/o gastric bypass - can cause ulcers   Current Outpatient Medications on File Prior to Visit  Medication Sig Dispense Refill   acetaminophen  (TYLENOL ) 325 MG tablet Take 2 tablets (650 mg total) by mouth 4 (four) times daily.     Baclofen  5 MG TABS Take 1 tablet (5  mg total) by mouth 3 (three) times daily. 90 tablet 0   DULoxetine  (CYMBALTA ) 20 MG capsule Take 20 mg by mouth 2 (two) times daily.     gabapentin  (NEURONTIN ) 600 MG tablet Take 1.5 tablets (900 mg total) by mouth 3 (three) times daily. 135 tablet 0   magnesium  hydroxide (MILK OF MAGNESIA) 400 MG/5ML suspension Take 30 mLs by mouth daily. (Patient taking differently: Take 30 mLs by mouth daily as needed.) 355 mL 0   morphine  (MS CONTIN ) 30 MG 12 hr tablet Take 1 tablet (30 mg total) by mouth every 12 (twelve) hours. 14 tablet 0   Multiple Vitamin (MULTIVITAMIN WITH MINERALS) TABS tablet Take 1 tablet by mouth daily. 30 tablet 0   olmesartan  (BENICAR ) 20 MG tablet Take 1 tablet (20 mg total) by mouth daily. 90 tablet 1   Oxycodone  HCl 10 MG TABS Take 10 mg by mouth.     phentermine  (ADIPEX-P ) 37.5 MG tablet Take 37.5 mg by mouth daily.     rosuvastatin  (CRESTOR ) 10 MG tablet Take 1 tablet (10 mg total) by mouth daily. 90 tablet 3   No current facility-administered medications on file prior to visit.        ROS:  All others reviewed and negative.  Objective        PE:  BP 122/78 (BP Location: Right Arm, Patient Position: Sitting, Cuff Size: Normal)   Pulse 67   Temp 98.3 F (36.8 C) (Oral)   Ht 5\' 10"  (1.778 m)   Wt 265 lb (120.2 kg)   SpO2 95%   BMI 38.02 kg/m                 Constitutional: Pt appears mild ill               HENT: Head: NCAT.                Right Ear: External ear normal.                 Left Ear: External ear normal.  Bilat tm's with mild erythema.  Max sinus areas non tender.  Pharynx with mild erythema, no exudate               Eyes: . Pupils are equal, round, and reactive to light. Conjunctivae and EOM are normal               Nose: without d/c or deformity               Neck: Neck supple. Gross normal ROM               Cardiovascular: Normal rate and regular rhythm.  Pulmonary/Chest: Effort normal and breath sounds with decreased BS, few RLL  rales and mild wheezing rhonchi.                               Neurological: Pt is alert. At baseline orientation, motor grossly intact               Skin: Skin is warm. No rashes, no other new lesions, LE edema - none               Psychiatric: Pt behavior is normal without agitation   Micro: none  Cardiac tracings I have personally interpreted today:  none  Pertinent Radiological findings (summarize): none   Lab Results  Component Value Date   WBC 7.0 01/02/2023   HGB 12.3 (L) 01/02/2023   HCT 39.2 01/02/2023   PLT 234.0 01/02/2023   GLUCOSE 94 01/02/2023   CHOL 152 01/02/2023   TRIG 95.0 01/02/2023   HDL 50.10 01/02/2023   LDLCALC 83 01/02/2023   ALT 20 01/02/2023   AST 17 01/02/2023   NA 135 01/02/2023   K 4.7 01/02/2023   CL 103 01/02/2023   CREATININE 0.90 01/02/2023   BUN 13 01/02/2023   CO2 28 01/02/2023   TSH 2.07 01/02/2023   PSA 0.56 01/02/2023   INR 1.0 01/02/2023   HGBA1C 5.6 06/15/2021   Assessment/Plan:  Bradley [name] is a 53 y.o. White or Caucasian [1] male with  has a past medical history of Allergy, Anxiety, Aortic valve sclerosis, Arthritis, Arthropathy, unspecified, other specified sites, CAD (coronary artery disease), Depression, GERD (gastroesophageal reflux disease), Heart murmur, History of kidney stones, Hypertension, Obesity, unspecified, Other, mixed, or unspecified nondependent drug abuse, unspecified, Personal history of urinary calculi, and Priapism.  Essential hypertension BP Readings from Last 3 Encounters:  07/10/23 122/78  06/22/23 132/82  05/28/23 130/88   Stable, pt to continue medical treatment benicar  20 mg qd   Vitamin D  deficiency Last vitamin D  Lab Results  Component Value Date   VD25OH 29.11 (L) 01/02/2023   Low, to start oral replacement   Acute cough Mild to mod, c/w bronchitis vs pna, for antibx course levaquin , cough med prn, CXR,  to f/u any worsening symptoms or concerns  Wheezing Mild to mod, for  prednisone  taper, inhaler prn,  to f/u any worsening symptoms or concerns  Followup: Return if symptoms worsen or fail to improve.  Rosalia Colonel, MD 07/10/2023 7:39 PM [facility] Medicine

## 2023-07-10 NOTE — Patient Instructions (Addendum)
 Please take all new medication as prescribed  - the antibiotic, cough medicine, prednisone , and Inhaler as needed  Please continue all other medications as before, and refills have been done if requested.  Please have the pharmacy call with any other refills you may need.  Please keep your appointments with your specialists as you may have planned  Please go to the XRAY Department in the first floor for the x-ray testing  You will be contacted by phone if any changes need to be made immediately.  Otherwise, you will receive a letter about your results with an explanation, but please check with MyChart first.

## 2023-07-10 NOTE — Assessment & Plan Note (Signed)
 Mild to mod, for prednisone taper, inhaler prn,  to f/u any worsening symptoms or concerns

## 2023-07-10 NOTE — Assessment & Plan Note (Signed)
 BP Readings from Last 3 Encounters:  07/10/23 122/78  06/22/23 132/82  05/28/23 130/88   Stable, pt to continue medical treatment benicar  20 mg qd

## 2023-07-10 NOTE — Assessment & Plan Note (Signed)
 Mild to mod, c/w bronchitis vs pna, for antibx course levaquin , cough med prn, CXR,  to f/u any worsening symptoms or concerns

## 2023-07-10 NOTE — Assessment & Plan Note (Signed)
 Last vitamin D  Lab Results  Component Value Date   VD25OH 29.11 (L) 01/02/2023   Low, to start oral replacement

## 2023-07-11 ENCOUNTER — Encounter: Payer: Self-pay | Admitting: Internal Medicine

## 2023-07-23 ENCOUNTER — Encounter: Payer: Self-pay | Admitting: Internal Medicine

## 2023-07-26 NOTE — Telephone Encounter (Signed)
 Stop the Crestor  and follow GI symptoms  Call back in a few weeks with response  See also my other note re sleep study

## 2023-07-26 NOTE — Progress Notes (Signed)
 In attempt to see if Ozempic  can be covered would recomm home Bradley [name]    IF positive would cover meds  [name] unfortunately is only covered if he had a heart event or stroke

## 2023-08-02 ENCOUNTER — Telehealth: Payer: Self-pay

## 2023-08-02 NOTE — Telephone Encounter (Signed)
 From Dr Avanell Bob:   In attempt to see if Bradley [name]  can be covered would recomm home [facility] Sleep study    IF positive would cover meds  Mounjaro unfortunately is only covered if he had a heart event or stroke  I left a message for the pt and sent a message to him via My Chart.

## 2023-08-02 NOTE — Telephone Encounter (Signed)
-----   Message from Bradley [name] sent at 07/26/2023  9:21 AM EDT -----    ----- Message ----- From: Benancio Bracket, RPH-CPP Sent: 06/26/2023  12:53 PM EDT To: Elmyra Haggard, MD  [facility] only has the approval for OSA. [employer]  has the CAD indication but insurances only pay if pt has hx of MI, Stroke or PAD(ABI <0.8) ----- Message ----- From: Elmyra Haggard, MD Sent: 06/23/2023  11:17 AM EDT To: Melissa D Maccia, RPH-CPP  QUestion about Mounjaro coverage   Has CAD by coronary calcium    WIll this be adequate for coverage?

## 2023-08-06 ENCOUNTER — Encounter: Payer: Self-pay | Admitting: Internal Medicine

## 2023-08-06 ENCOUNTER — Ambulatory Visit: Payer: Self-pay

## 2023-08-06 NOTE — Telephone Encounter (Signed)
 Pt needs appt for any new medication to be sent. Will be addressed at 6/9 visit

## 2023-08-06 NOTE — Telephone Encounter (Signed)
 Copied from CRM [id number]. Topic: Clinical - Red Word Triage >> Aug 06, 2023 10:31 AM [employer] wrote: Red Word that prompted transfer to Nurse Triage: nausea, pain in right side.    Chief Complaint: Right side abdominal pain, comes and goes. Will only see Dr. Rochelle Chu. Also asking for medication for thrush in mouth. Symptoms: above Frequency: weekend Pertinent Negatives: Patient denies fever Disposition: [] ED /[] Urgent Care (no appt availability in office) / [] Appointment(In office/virtual)/ []  [facility]/ [] Home Care/ [] Refused Recommended Disposition /[] [facility]/ [x]  Follow-up with PCP Additional Notes: Please advise pt. On thrush medication. Reason for Disposition  [1] MILD-MODERATE pain AND [2] constant AND [3] present > 2 hours  Answer Assessment - Initial Assessment Questions 1. LOCATION: "Where does it hurt?"      Right side 2. RADIATION: "Does the pain shoot anywhere else?" (e.g., chest, back)     back 3. ONSET: "When did the pain begin?" (Minutes, hours or days ago)      weekend 4. SUDDEN: "Gradual or sudden onset?"     gradual 5. PATTERN "Does the pain come and go, or is it constant?"    - If it comes and goes: "How long does it last?" "Do you have pain now?"     (Note: Comes and goes means the pain is intermittent. It goes away completely between bouts.)    - If constant: "Is it getting better, staying the same, or getting worse?"      (Note: Constant means the pain never goes away completely; most serious pain is constant and gets worse.)      Comes and goes 6. SEVERITY: "How bad is the pain?"  (e.g., Scale 1-10; mild, moderate, or severe)    - MILD (1-3): Doesn't interfere with normal activities, abdomen soft and not tender to touch.     - MODERATE (4-7): Interferes with normal activities or awakens from sleep, abdomen tender to touch.     - SEVERE (8-10): Excruciating pain, doubled over, unable to do any normal activities.       10 7. RECURRENT  SYMPTOM: "Have you ever had this type of stomach pain before?" If Yes, ask: "When was the last time?" and "What happened that time?"      yes 8. CAUSE: "What do you think is causing the stomach pain?"     Maybe a kidney stone 9. RELIEVING/AGGRAVATING FACTORS: "What makes it better or worse?" (e.g., antacids, bending or twisting motion, bowel movement)     heat 10. OTHER SYMPTOMS: "Do you have any other symptoms?" (e.g., back pain, diarrhea, fever, urination pain, vomiting)       no  Protocols used: Abdominal Pain - Male-A-AH

## 2023-08-08 ENCOUNTER — Other Ambulatory Visit: Payer: Self-pay | Admitting: Internal Medicine

## 2023-08-08 NOTE — Telephone Encounter (Signed)
 Pt referring to Thrush medication he would like sent in

## 2023-08-09 DIAGNOSIS — M5416 Radiculopathy, lumbar region: Secondary | ICD-10-CM | POA: Diagnosis not present

## 2023-08-13 ENCOUNTER — Ambulatory Visit: Admitting: Internal Medicine

## 2023-08-13 ENCOUNTER — Encounter: Payer: Self-pay | Admitting: Internal Medicine

## 2023-08-13 VITALS — BP 112/76 | HR 67 | Temp 97.5°F | Resp 16 | Ht 70.0 in | Wt 249.0 lb

## 2023-08-13 DIAGNOSIS — B370 Candidal stomatitis: Secondary | ICD-10-CM | POA: Diagnosis not present

## 2023-08-13 DIAGNOSIS — D510 Vitamin B12 deficiency anemia due to intrinsic factor deficiency: Secondary | ICD-10-CM | POA: Diagnosis not present

## 2023-08-13 DIAGNOSIS — I10 Essential (primary) hypertension: Secondary | ICD-10-CM | POA: Diagnosis not present

## 2023-08-13 DIAGNOSIS — D508 Other iron deficiency anemias: Secondary | ICD-10-CM | POA: Diagnosis not present

## 2023-08-13 DIAGNOSIS — Z6836 Body mass index (BMI) 36.0-36.9, adult: Secondary | ICD-10-CM

## 2023-08-13 DIAGNOSIS — Z9884 Bariatric surgery status: Secondary | ICD-10-CM | POA: Diagnosis not present

## 2023-08-13 DIAGNOSIS — E60 Dietary zinc deficiency: Secondary | ICD-10-CM | POA: Diagnosis not present

## 2023-08-13 DIAGNOSIS — E66812 Obesity, class 2: Secondary | ICD-10-CM

## 2023-08-13 DIAGNOSIS — E519 Thiamine deficiency, unspecified: Secondary | ICD-10-CM

## 2023-08-13 DIAGNOSIS — N410 Acute prostatitis: Secondary | ICD-10-CM

## 2023-08-13 LAB — CBC WITH DIFFERENTIAL/PLATELET
Basophils Absolute: 0 10*3/uL (ref 0.0–0.1)
Basophils Relative: 0.4 % (ref 0.0–3.0)
Eosinophils Absolute: 0.1 10*3/uL (ref 0.0–0.7)
Eosinophils Relative: 1.6 % (ref 0.0–5.0)
HCT: 38.3 % — ABNORMAL LOW (ref 39.0–52.0)
Hemoglobin: 12.4 g/dL — ABNORMAL LOW (ref 13.0–17.0)
Lymphocytes Relative: 46.1 % — ABNORMAL HIGH (ref 12.0–46.0)
Lymphs Abs: 2.5 10*3/uL (ref 0.7–4.0)
MCHC: 32.5 g/dL (ref 30.0–36.0)
MCV: 81.1 fl (ref 78.0–100.0)
Monocytes Absolute: 0.6 10*3/uL (ref 0.1–1.0)
Monocytes Relative: 10.5 % (ref 3.0–12.0)
Neutro Abs: 2.3 10*3/uL (ref 1.4–7.7)
Neutrophils Relative %: 41.4 % — ABNORMAL LOW (ref 43.0–77.0)
Platelets: 244 10*3/uL (ref 150.0–400.0)
RBC: 4.72 Mil/uL (ref 4.22–5.81)
RDW: 17.2 % — ABNORMAL HIGH (ref 11.5–15.5)
WBC: 5.5 10*3/uL (ref 4.0–10.5)

## 2023-08-13 LAB — HEPATIC FUNCTION PANEL
ALT: 25 U/L (ref 0–53)
AST: 28 U/L (ref 0–37)
Albumin: 4.1 g/dL (ref 3.5–5.2)
Alkaline Phosphatase: 113 U/L (ref 39–117)
Bilirubin, Direct: 0.1 mg/dL (ref 0.0–0.3)
Total Bilirubin: 0.5 mg/dL (ref 0.2–1.2)
Total Protein: 7.2 g/dL (ref 6.0–8.3)

## 2023-08-13 LAB — BASIC METABOLIC PANEL WITH GFR
BUN: 28 mg/dL — ABNORMAL HIGH (ref 6–23)
CO2: 27 meq/L (ref 19–32)
Calcium: 8.9 mg/dL (ref 8.4–10.5)
Chloride: 103 meq/L (ref 96–112)
Creatinine, Ser: 1.43 mg/dL (ref 0.40–1.50)
GFR: 55.4 mL/min — ABNORMAL LOW (ref >60.00)
Glucose, Bld: 95 mg/dL (ref 70–99)
Potassium: 5 meq/L (ref 3.5–5.1)
Sodium: 136 meq/L (ref 135–145)

## 2023-08-13 MED ORDER — CYANOCOBALAMIN 1000 MCG/ML IJ SOLN
1000.0000 ug | Freq: Once | INTRAMUSCULAR | Status: AC
  Administered 2023-08-13: 1000 ug via INTRAMUSCULAR

## 2023-08-13 MED ORDER — CYANOCOBALAMIN 1000 MCG/ML IJ SOLN: 1000.0000 ug | INTRAMUSCULAR | 1 refills | Status: AC

## 2023-08-13 MED ORDER — NYSTATIN 100000 UNIT/ML MT SUSP: 5.0000 mL | Freq: Four times a day (QID) | OROMUCOSAL | 3 refills | Status: DC

## 2023-08-13 NOTE — Progress Notes (Unsigned)
 Subjective:  Patient ID: Bradley [name], male    DOB: [birth year]  Age: 53 y.o. MRN: [id number]  CC: Anemia and Abdominal Pain   HPI SENAI KINGSLEY presents for f/up ----  Discussed the use of AI scribe software for clinical note transcription with the patient, who gave verbal consent to proceed.  History of Present Illness   Bradley [name] is a 53 year old male who presents with multiple musculoskeletal complaints and a history of recent falls.  He has experienced multiple falls, including a significant incident where he fell through a ceiling and another fall in January while putting away Christmas decorations, resulting in a knee injury. An MRI indicated a bursa leak that turned into bone with residual fluid. He wears an ankle brace due to foot issues and is unable to pursue surgery for foot fusion at this time.  He has been advised to undergo a back fusion at the L4-L5 level but is deferring the procedure until he loses more weight. He has lost 25 pounds over the last three months and is actively trying to lose more weight.  He experienced severe abdominal pain that he initially thought might be appendicitis. The pain is similar to previous gallbladder attacks, with the last episode occurring the weekend before last. The pain is intermittent and severe when present, but he has not experienced it in the past seven days. No current abdominal pain, swelling, or pain in the lower extremities.  He had a recent episode of oral thrush, which he treated with an old prescription that provided relief. He is unsure if the thrush is completely resolved.  He is currently taking multivitamins and has not had a B12 shot in several months, indicating he might be due for one. He also mentions a history of a heart murmur and was advised to have full blood work done recently.  He is on a fixed income and is concerned about the cost of multiple visits. He reports frustration with the clinic's staff,  citing difficulties in communication and scheduling.       Outpatient Medications Prior to Visit  Medication Sig Dispense Refill   acetaminophen  (TYLENOL ) 325 MG tablet Take 2 tablets (650 mg total) by mouth 4 (four) times daily.     albuterol  (VENTOLIN  HFA) 108 (90 Base) MCG/ACT inhaler Inhale 2 puffs into the lungs every 6 (six) hours as needed for wheezing or shortness of breath. 8 g 1   Baclofen  5 MG TABS Take 1 tablet (5 mg total) by mouth 3 (three) times daily. 90 tablet 0   DULoxetine  (CYMBALTA ) 20 MG capsule Take 20 mg by mouth 2 (two) times daily.     gabapentin  (NEURONTIN ) 600 MG tablet Take 1.5 tablets (900 mg total) by mouth 3 (three) times daily. 135 tablet 0   magnesium  hydroxide (MILK OF MAGNESIA) 400 MG/5ML suspension Take 30 mLs by mouth daily. (Patient taking differently: Take 30 mLs by mouth daily as needed.) 355 mL 0   morphine  (MS CONTIN ) 30 MG 12 hr tablet Take 1 tablet (30 mg total) by mouth every 12 (twelve) hours. 14 tablet 0   Multiple Vitamin (MULTIVITAMIN WITH MINERALS) TABS tablet Take 1 tablet by mouth daily. 30 tablet 0   Oxycodone  HCl 10 MG TABS Take 10 mg by mouth.     rosuvastatin  (CRESTOR ) 10 MG tablet Take 1 tablet (10 mg total) by mouth daily. 90 tablet 3   levofloxacin  (LEVAQUIN ) 500 MG tablet Take 1 tablet (500 mg total)  by mouth daily. 10 tablet 0   olmesartan  (BENICAR ) 20 MG tablet Take 1 tablet (20 mg total) by mouth daily. 90 tablet 1   phentermine  (ADIPEX-P ) 37.5 MG tablet Take 37.5 mg by mouth daily.     predniSONE  (DELTASONE ) 10 MG tablet 3 tabs by mouth per day for 3 days,2tabs per day for 3 days,1tab per day for 3 days 18 tablet 0   No facility-administered medications prior to visit.    ROS Review of Systems  Constitutional:  Negative for appetite change, chills, diaphoresis, fatigue and fever.  HENT: Negative.    Eyes: Negative.   Respiratory:  Negative for chest tightness, shortness of breath and wheezing.   Cardiovascular:  Negative for  chest pain, palpitations and leg swelling.  Gastrointestinal:  Positive for abdominal pain. Negative for abdominal distention, constipation, diarrhea, nausea and vomiting.  Genitourinary: Negative.  Negative for difficulty urinating.  Musculoskeletal:  Positive for arthralgias, back pain and gait problem.  Neurological:  Negative for dizziness and weakness.  Hematological:  Negative for adenopathy. Does not bruise/bleed easily.  Psychiatric/Behavioral: Negative.      Objective:  BP 112/76 (BP Location: Right Arm, Patient Position: Sitting, Cuff Size: Normal)   Pulse 67   Temp (!) 97.5 F (36.4 C) (Oral)   Resp 16   Ht 5' 10 (1.778 m)   Wt 249 lb (112.9 kg)   SpO2 98%   BMI 35.73 kg/m   BP Readings from Last 3 Encounters:  08/13/23 112/76  07/10/23 122/78  06/22/23 132/82    Wt Readings from Last 3 Encounters:  08/13/23 249 lb (112.9 kg)  07/10/23 265 lb (120.2 kg)  06/22/23 268 lb (121.6 kg)    Physical Exam Vitals reviewed.  Constitutional:      General: He is not in acute distress.    Appearance: Normal appearance. He is not ill-appearing, toxic-appearing or diaphoretic.  HENT:     Mouth/Throat:     Lips: No lesions.     Mouth: Mucous membranes are moist.     Pharynx: Oropharynx is clear.     Tonsils: No tonsillar exudate or tonsillar abscesses.   Eyes:     General: No scleral icterus.    Conjunctiva/sclera: Conjunctivae normal.    Cardiovascular:     Rate and Rhythm: Normal rate.     Heart sounds: No murmur heard.    No gallop.  Pulmonary:     Effort: Pulmonary effort is normal.     Breath sounds: No stridor. No wheezing, rhonchi or rales.  Abdominal:     General: Abdomen is flat. Bowel sounds are normal. There is no distension.     Palpations: There is no hepatomegaly, splenomegaly or mass.     Tenderness: There is no abdominal tenderness. There is no guarding or rebound.     Hernia: No hernia is present.   Musculoskeletal:        General: Normal  range of motion.     Cervical back: Neck supple.     Right lower leg: No edema.     Left lower leg: No edema.   Skin:    General: Skin is warm and dry.   Neurological:     General: No focal deficit present.     Mental Status: He is alert. Mental status is at baseline.     Lab Results  Component Value Date   WBC 5.5 08/13/2023   HGB 12.4 (L) 08/13/2023   HCT 38.3 (L) 08/13/2023   PLT 244.0 08/13/2023  GLUCOSE 95 08/13/2023   CHOL 152 01/02/2023   TRIG 95.0 01/02/2023   HDL 50.10 01/02/2023   LDLCALC 83 01/02/2023   ALT 25 08/13/2023   AST 28 08/13/2023   NA 136 08/13/2023   K 5.0 08/13/2023   CL 103 08/13/2023   CREATININE 1.43 08/13/2023   BUN 28 (H) 08/13/2023   CO2 27 08/13/2023   TSH 2.07 01/02/2023   PSA 0.56 01/02/2023   INR 1.0 01/02/2023   HGBA1C 5.6 06/15/2021    MR FOOT LEFT WO CONTRAST Result Date: 06/19/2023 CLINICAL DATA:  Left ankle and foot pain. Fell through an attic 04/14/2022. EXAM: MRI OF THE LEFT FOOT WITHOUT CONTRAST TECHNIQUE: Multiplanar, multisequence MR imaging of the left forefoot was performed. No intravenous contrast was administered. COMPARISON:  Left foot radiographs 05/28/2023 and 10/06/2019 FINDINGS: Bones/Joint/Cartilage There is again a well corticated ossicle measuring up to 10 mm at the distal medial aspect of the proximal phalanx of the great toe, likely a remote nonunited fracture unchanged from 05/28/2023 radiographs but new from 10/06/2019 radiographs. There is moderate chronic marrow edema/cystic change at the junction of this ossicle and the distal lateral aspect of the proximal phalanx of the great toe at the interphalangeal joint articular surface. Moderate great toe interphalangeal peripheral degenerative spurring. Small great toe metatarsophalangeal joint effusion. Bipartite lateral and medial great toe metatarsophalangeal joint sesamoids with minimal subchondral edema/cystic change (coronal series 8, image 19). Mild great toe  metatarsophalangeal peripheral degenerative spurring. Moderate to severe second, moderate third, mild to moderate first, fourth, and fifth tarsometatarsal osteoarthritis. There is moderate to high-grade marrow edema throughout the proximal 60% of the fifth metatarsal, greatest surrounding somewhat linear decreased T1 and increased T2 signal along the longitudinal dimension of the far medial portion of the fifth metatarsal. There is similar linear and cystic decreased T1 and increased T2 signal within the adjacent lateral aspect of the proximal 3.5 cm of the fourth metatarsal. Additional moderate to high-grade marrow edema and cystic change within the second tarsometatarsal joint, greatest dorsally where there is full-thickness cartilage loss (sagittal series 9, image 16 and coronal series 8, image 8). There is similar patchy decreased T1 and increased T2 predominantly cystic appearing signal within the proximal 3 cm of the first metatarsal. No definite acute fracture line is seen in these regions on prior recent 05/28/2023 radiographs. Ligaments The Lisfranc ligament complex is intact. The metatarsophalangeal and interphalangeal collateral ligaments are intact. Muscles and Tendons Normal muscle signal and size. No flexor extensor tendon tear is seen. Soft tissues Mild subcutaneous fat edema and swelling of the from the dorsolateral midfoot to forefoot. No walled-off abscess. IMPRESSION: 1. Moderate to high-grade marrow edema and cystic change within the proximal aspect of the first through fifth metatarsals, with some somewhat linear portions within the proximal third through fifth metatarsals. There is moderate to severe second, moderate third, and mild to moderate first, fourth, and fifth tarsometatarsal osteoarthritis. Overall these changes appear to be subacute to chronic. This may reflect a combination of chronic and more recent degenerative and stress related changes. 2. Well corticated ossicle measuring up to  10 mm at the distal medial aspect of the proximal phalanx of the great toe, likely a remote nonunited fracture unchanged from 05/28/2023 radiographs but new from 10/06/2019 radiographs. There is moderate chronic marrow edema/cystic change at the junction of this ossicle and the distal lateral aspect of the proximal phalanx of the great toe at the interphalangeal joint articular surface. 3. Mild great toe metatarsophalangeal joint  osteoarthritis. Electronically Signed   By: Bertina Broccoli M.D.   On: 06/19/2023 15:27   MR ANKLE LEFT WO CONTRAST Result Date: 06/19/2023 CLINICAL DATA:  Left ankle and foot pain. Fell through an attic 04/14/2022. EXAM: MRI OF THE LEFT ANKLE WITHOUT CONTRAST TECHNIQUE: Multiplanar, multisequence MR imaging of the ankle was performed. No intravenous contrast was administered. COMPARISON:  Left ankle and foot radiographs 05/28/2023, left foot radiographs 10/06/2019 FINDINGS: TENDONS Peroneal: Minimal peroneus longus and brevis tenosynovitis. No tendon tear. Posteromedial: Minimal posterior tibial tenosynovitis. The flexor digitorum longus and flexor digitorum longus tendons are intact. Anterior: The tibialis anterior, extensor hallucis longus, and extensor digitorum longus tendons are intact. Achilles: Minimal intermediate T2 signal tendinosis of the mid to distal, medial aspect of the Achilles tendon (sagittal series 7, image 15). Plantar Fascia: There is minimal intermediate T2 signal within the origin of the lateral band of the plantar fascia (coronal series 8, image 9 and sagittal series 7, images 14 and 15). Minimal adjacent calcaneal marrow edema. Normal appearance of the origin of the medial band of the plantar fascia. LIGAMENTS Lateral: The anterior and posterior talofibular, anterior and posterior tibiofibular, and calcaneofibular ligaments are intact. Medial: The tibiotalar deep deltoid and tibial spring ligaments are intact. CARTILAGE Ankle Joint: There is high-grade partial and  full-thickness cartilage loss within the anterior tibial plafond, greatest at the mid to medial aspect where there is moderate underlying subchondral marrow edema and cystic change. There is high-grade partial full-thickness cartilage loss within the far lateral aspect of the talar dome cartilage with subchondral cystic change in a region measuring up to 6 mm in transverse dimension (coronal image 18) and 15 mm in AP dimension (sagittal image 18). Mild dorsal talar dome-neck junction degenerative osteophytosis. Subtalar Joints/Sinus Tarsi: Moderate middle and posterior subtalar joint cartilage thinning. There is edema throughout the sinus tarsi as can be seen with sinus tarsi syndrome. Bones: There is moderate to high-grade midfoot osteoarthritis including cartilage thinning, subchondral marrow edema/cystic change, peripheral osteophytosis, greatest within the navicular-medial cuneiform articulation (sagittal image 9 and axial image 17) and the second through fifth tarsometatarsal joints. Other: The tarsal tunnel is unremarkable. The Lisfranc ligament complex is intact. IMPRESSION: 1. Moderate to high-grade midfoot osteoarthritis, greatest within the navicular-medial cuneiform articulation and the second through fifth tarsometatarsal joints. 2. Moderate anterior tibiotalar osteoarthritis. 3. Minimal peroneus longus and brevis tenosynovitis. 4. Minimal posterior tibial tenosynovitis. 5. Minimal tendinosis of the mid to distal, medial aspect of the Achilles tendon. 6. Minimal fasciitis of the origin of the lateral band of the plantar fascia. 7. Edema throughout the sinus tarsi as can be seen with sinus tarsi syndrome. Electronically Signed   By: Bertina Broccoli M.D.   On: 06/19/2023 15:14    Assessment & Plan:  Other iron deficiency anemia -     IBC + Ferritin; Future -     CBC with Differential/Platelet; Future  Vitamin B12 deficiency anemia due to intrinsic factor deficiency -     CBC with  Differential/Platelet; Future -     Folate; Future -     Cyanocobalamin  -     Cyanocobalamin ; Inject 1 mL (1,000 mcg total) into the muscle every 30 (thirty) days.  Dispense: 3 mL; Refill: 1  Zinc  deficiency -     CBC with Differential/Platelet; Future -     Zinc ; Future  Thiamine  deficiency -     CBC with Differential/Platelet; Future  Gastric bypass status for obesity -     IBC + Ferritin;  Future -     Zinc ; Future -     Folate; Future -     Vitamin B1; Future  Essential hypertension- His Bp is over-controlled. Will hold the ARB. -     Basic metabolic panel with GFR; Future -     Hepatic function panel; Future -     Urinalysis, Routine w reflex microscopic; Future -     Cortisol; Future  Thrush, oral -     Nystatin ; Take 5 mLs (500,000 Units total) by mouth 4 (four) times daily.  Dispense: 60 mL; Refill: 3  Class 2 severe obesity due to excess calories with serious comorbidity and body mass index (BMI) of 36.0 to 36.9 in adult (HCC) -     Tirzepatide -Weight Management; Inject 2.5 mg into the skin once a week.  Dispense: 2 mL; Refill: 0  Acute prostatitis -     Sulfamethoxazole -Trimethoprim ; Take 1 tablet by mouth 2 (two) times daily for 21 days.  Dispense: 42 tablet; Refill: 0     Follow-up: Return in about 4 months (around 12/13/2023).  Sandra Crouch, MD

## 2023-08-13 NOTE — Patient Instructions (Signed)

## 2023-08-14 LAB — URINALYSIS, ROUTINE W REFLEX MICROSCOPIC
Bilirubin Urine: NEGATIVE
Ketones, ur: NEGATIVE
Nitrite: NEGATIVE
Specific Gravity, Urine: 1.02 (ref 1.000–1.030)
Total Protein, Urine: NEGATIVE
Urine Glucose: NEGATIVE
Urobilinogen, UA: 0.2 (ref 0.0–1.0)
pH: 6 (ref 5.0–8.0)

## 2023-08-15 MED ORDER — TIRZEPATIDE-WEIGHT MANAGEMENT 2.5 MG/0.5ML ~~LOC~~ SOLN: 2.5000 mg | SUBCUTANEOUS | 0 refills | Status: DC

## 2023-08-16 ENCOUNTER — Ambulatory Visit: Payer: Self-pay | Admitting: Internal Medicine

## 2023-08-16 LAB — CORTISOL: Cortisol, Plasma: 2.8 ug/dL

## 2023-08-16 LAB — IBC + FERRITIN
Ferritin: 27.5 ng/mL (ref 22.0–322.0)
Iron: 61 ug/dL (ref 42–165)
Saturation Ratios: 18 % — ABNORMAL LOW (ref 20.0–50.0)
TIBC: 338.8 ug/dL (ref 250.0–450.0)
Transferrin: 242 mg/dL (ref 212.0–360.0)

## 2023-08-16 LAB — ZINC: Zinc: 61 ug/dL (ref 60–130)

## 2023-08-16 LAB — FOLATE: Folate: 10 ng/mL

## 2023-08-18 DIAGNOSIS — N410 Acute prostatitis: Secondary | ICD-10-CM | POA: Insufficient documentation

## 2023-08-18 MED ORDER — SULFAMETHOXAZOLE-TRIMETHOPRIM 800-160 MG PO TABS: 1.0000 | ORAL_TABLET | Freq: Two times a day (BID) | ORAL | 0 refills | Status: AC

## 2023-08-31 ENCOUNTER — Other Ambulatory Visit: Payer: Self-pay | Admitting: Internal Medicine

## 2023-09-13 ENCOUNTER — Ambulatory Visit (INDEPENDENT_AMBULATORY_CARE_PROVIDER_SITE_OTHER)

## 2023-09-13 ENCOUNTER — Ambulatory Visit: Admitting: Sports Medicine

## 2023-09-13 VITALS — BP 132/84 | HR 78 | Ht 70.0 in | Wt 253.0 lb

## 2023-09-13 DIAGNOSIS — M25562 Pain in left knee: Secondary | ICD-10-CM | POA: Diagnosis not present

## 2023-09-13 DIAGNOSIS — M79642 Pain in left hand: Secondary | ICD-10-CM | POA: Diagnosis not present

## 2023-09-13 DIAGNOSIS — M180 Bilateral primary osteoarthritis of first carpometacarpal joints: Secondary | ICD-10-CM | POA: Diagnosis not present

## 2023-09-13 DIAGNOSIS — W19XXXA Unspecified fall, initial encounter: Secondary | ICD-10-CM

## 2023-09-13 DIAGNOSIS — M25561 Pain in right knee: Secondary | ICD-10-CM

## 2023-09-13 DIAGNOSIS — M170 Bilateral primary osteoarthritis of knee: Secondary | ICD-10-CM | POA: Diagnosis not present

## 2023-09-13 DIAGNOSIS — R0781 Pleurodynia: Secondary | ICD-10-CM

## 2023-09-13 DIAGNOSIS — M79641 Pain in right hand: Secondary | ICD-10-CM

## 2023-09-13 DIAGNOSIS — M25462 Effusion, left knee: Secondary | ICD-10-CM | POA: Diagnosis not present

## 2023-09-13 DIAGNOSIS — M1712 Unilateral primary osteoarthritis, left knee: Secondary | ICD-10-CM

## 2023-09-13 MED ORDER — PREDNISONE 10 MG (21) PO TBPK: ORAL_TABLET | ORAL | 0 refills | Status: DC

## 2023-09-13 MED ORDER — MELOXICAM 15 MG PO TABS: 15.0000 mg | ORAL_TABLET | Freq: Every day | ORAL | 0 refills | Status: DC

## 2023-09-13 NOTE — Patient Instructions (Signed)
-   Start meloxicam 15 mg daily x2 weeks.  If still having pain after 2 weeks, complete 3rd-week of NSAID. May use remaining NSAID as needed once daily for pain control.  Do not to use additional over-the-counter NSAIDs (ibuprofen, naproxen, Advil, Aleve, etc.) while taking prescription NSAIDs.  May use Tylenol [phone] mg 2 to 3 times a day for breakthrough pain. 4 week follow up

## 2023-09-13 NOTE — Progress Notes (Signed)
 Ben Kayani Rapaport D.[facility] [address] [place] [address] Phone: [phone]   Assessment and Plan:     1. Fall, initial encounter 2. Acute pain of both knees 3. Primary osteoarthritis of left knee 4. Bilateral hand pain 5. Rib pain on right side - Chronic with exacerbation, initial visit - Patient presents with multiple musculoskeletal pains after recent fall.  Pain is most intense in right palm, left knee, right rib cage - X-rays obtained in clinic.  My interpretation: No acute fracture or dislocation.  Severe degenerative changes in bilateral CMC joints.  Mild medial compartment osteoarthritis in left knee.  Intact hardware from right knee replacement. -Do not recommend NSAIDs with past medical history of gastric bypass - May start prednisone  Dosepak next week -Patient elected for intra-articular CSI to left knee.  Tolerated well per note below  Procedure: Knee Joint Injection Side: Left Indication: Flare of osteoarthritis  Risks explained and consent was given verbally. The site was cleaned with alcohol  prep. A needle was introduced with an anterio-lateral approach. Injection given using 2mL of 1% lidocaine  without epinephrine  and 1mL of kenalog  40mg /ml. This was well tolerated and resulted in symptomatic relief.  Needle was removed, hemostasis achieved, and post injection instructions were explained.   Pt was advised to call or return to clinic if these symptoms worsen or fail to improve as anticipated.    Pertinent previous records reviewed include none  Follow Up: 4 weeks for reevaluation.  Could consider physical therapy versus further workup of areas of remaining pain   Subjective:   I, Bradley [name], am serving as a Neurosurgeon for Doctor Morene Mace  Chief Complaint: bilat knee and hand pain   HPI:   09/13/23 Patient is a 53 year old male with bilat knee and hand pain. Patient states he tripped over some bricks.  He felt  a pop on his left knee. Hx of a fall last year. Hx of leaking bursa. Left leg is swollen   Bilat hand right hand is worst than left has some bruising on the thumb. Decreased ROM.he is dropping things , decrease in grip strength. No radiating pain. Numbness and tingling.   Right chest/rib pain. Decreased ROM. TTP through the chest and ribs. Has a little pain when taking a deep breath. He has been wrapping his chest to go to sleep through the night     Relevant Historical Information: Hypertension, NASH, GERD, history of gastric bypass  Additional pertinent review of systems negative.   Current Outpatient Medications:    predniSONE  (STERAPRED UNI-PAK 21 TAB) 10 MG (21) TBPK tablet, 12 day taper pack, use as directed, Disp: 21 tablet, Rfl: 0   acetaminophen  (TYLENOL ) 325 MG tablet, Take 2 tablets (650 mg total) by mouth 4 (four) times daily., Disp: , Rfl:    albuterol  (VENTOLIN  HFA) 108 (90 Base) MCG/ACT inhaler, Inhale 2 puffs into the lungs every 6 (six) hours as needed for wheezing or shortness of breath., Disp: 8 g, Rfl: 1   Baclofen  5 MG TABS, Take 1 tablet (5 mg total) by mouth 3 (three) times daily., Disp: 90 tablet, Rfl: 0   cyanocobalamin  (VITAMIN B12) 1000 MCG/ML injection, Inject 1 mL (1,000 mcg total) into the muscle every 30 (thirty) days., Disp: 3 mL, Rfl: 1   DULoxetine  (CYMBALTA ) 20 MG capsule, Take 20 mg by mouth 2 (two) times daily., Disp: , Rfl:    gabapentin  (NEURONTIN ) 600 MG tablet, Take 1.5 tablets (900 mg total)  by mouth 3 (three) times daily., Disp: 135 tablet, Rfl: 0   magnesium  hydroxide (MILK OF MAGNESIA) 400 MG/5ML suspension, Take 30 mLs by mouth daily. (Patient taking differently: Take 30 mLs by mouth daily as needed.), Disp: 355 mL, Rfl: 0   morphine  (MS CONTIN ) 30 MG 12 hr tablet, Take 1 tablet (30 mg total) by mouth every 12 (twelve) hours., Disp: 14 tablet, Rfl: 0   Multiple Vitamin (MULTIVITAMIN WITH MINERALS) TABS tablet, Take 1 tablet by mouth daily., Disp:  30 tablet, Rfl: 0   nystatin  (MYCOSTATIN ) 100000 UNIT/ML suspension, Take 5 mLs (500,000 Units total) by mouth 4 (four) times daily., Disp: 60 mL, Rfl: 3   Oxycodone  HCl 10 MG TABS, Take 10 mg by mouth., Disp: , Rfl:    rosuvastatin  (CRESTOR ) 10 MG tablet, Take 1 tablet (10 mg total) by mouth daily., Disp: 90 tablet, Rfl: 3   ZEPBOUND  2.5 MG/0.5ML injection vial, INJECT 0.5 ML (2.5 MG) UNDER THE SKIN ONCE WEEKLY (0.5ML= 50 UNITS), Disp: 2 mL, Rfl: 0   Objective:     Vitals:   09/13/23 0957  BP: 132/84  Pulse: 78  SpO2: 98%  Weight: 253 lb (114.8 kg)  Height: 5' 10 (1.778 m)      Body mass index is 36.3 kg/m.    Physical Exam:    General:  awake, alert oriented, no acute distress nontoxic Skin: no suspicious lesions or rashes Neuro:sensation intact and strength 5/5 with no deficits, no atrophy, normal muscle tone Psych: No signs of anxiety, depression or other mood disorder  Left knee: Bursal swelling superficial to tibial tuberosity No deformity Mild fluid wave, joint milking ROM Flex 110, Ext 0 TTP medial joint line NTTP over the quad tendon, medial fem condyle, lat fem condyle, patella, plica, patella tendon, tibial tuberostiy, fibular head, posterior fossa, pes anserine bursa, gerdy's tubercle, medial jt line, lateral jt line   Gait normal  Bilateral hands: TTP bilateral CMC, worse on right   Electronically signed by:  [name] D.[facility] 11:22 AM 09/13/23

## 2023-09-17 ENCOUNTER — Ambulatory Visit: Payer: Self-pay | Admitting: Sports Medicine

## 2023-09-18 DIAGNOSIS — M25562 Pain in left knee: Secondary | ICD-10-CM | POA: Diagnosis not present

## 2023-09-18 DIAGNOSIS — M5416 Radiculopathy, lumbar region: Secondary | ICD-10-CM | POA: Diagnosis not present

## 2023-09-18 DIAGNOSIS — G894 Chronic pain syndrome: Secondary | ICD-10-CM | POA: Diagnosis not present

## 2023-09-18 DIAGNOSIS — Z79899 Other long term (current) drug therapy: Secondary | ICD-10-CM | POA: Diagnosis not present

## 2023-09-21 ENCOUNTER — Telehealth: Payer: Self-pay | Admitting: Sports Medicine

## 2023-09-21 ENCOUNTER — Other Ambulatory Visit: Payer: Self-pay | Admitting: Internal Medicine

## 2023-09-21 DIAGNOSIS — I10 Essential (primary) hypertension: Secondary | ICD-10-CM

## 2023-09-21 NOTE — Telephone Encounter (Signed)
 Pt called to schedule follow up. L knee is worse, cannot bear wait and currently using a walker.  No avail appts, unsure if pt should be overbooked. Pt also mentioned L knee MRI possibility as he heard something pop when injury occurred.

## 2023-09-24 ENCOUNTER — Other Ambulatory Visit: Payer: Self-pay | Admitting: Sports Medicine

## 2023-09-24 ENCOUNTER — Ambulatory Visit
Admission: RE | Admit: 2023-09-24 | Discharge: 2023-09-24 | Disposition: A | Discharge status: Home / Self Care | Source: Ambulatory Visit | Attending: Sports Medicine | Admitting: Sports Medicine

## 2023-09-24 DIAGNOSIS — M25562 Pain in left knee: Secondary | ICD-10-CM

## 2023-09-24 DIAGNOSIS — M23322 Other meniscus derangements, posterior horn of medial meniscus, left knee: Secondary | ICD-10-CM | POA: Diagnosis not present

## 2023-09-24 DIAGNOSIS — M1712 Unilateral primary osteoarthritis, left knee: Secondary | ICD-10-CM | POA: Diagnosis not present

## 2023-09-24 NOTE — Telephone Encounter (Signed)
 Referral placed.

## 2023-09-24 NOTE — Progress Notes (Signed)
 Referral placed.

## 2023-09-26 ENCOUNTER — Other Ambulatory Visit: Payer: Self-pay | Admitting: Internal Medicine

## 2023-09-26 DIAGNOSIS — Z6836 Body mass index (BMI) 36.0-36.9, adult: Secondary | ICD-10-CM

## 2023-09-26 NOTE — Progress Notes (Unsigned)
 Ben Jackson D.[facility] [address] [place] [address] Phone: [phone]   Assessment and Plan:    1. Primary osteoarthritis of left knee 2. Acute pain of left knee 3. Tear of left meniscus as current injury, initial encounter  -Chronic with exacerbation, worsening, subsequent visit - Patient presents with continued knee pain that has worsened since previous office visit after intra-articular CSI and prednisone  course.  Consistent with flare of tricompartmental osteoarthritis with reactive edema in the medial compartment, radial tear posterior horn medial meniscus as seen on MRI and reviewed today in clinic - Since patient had no improvement and worsening of symptoms after intra-articular CSI, prednisone  course, cannot use p.o. NSAIDs due to history of gastric bypass, continues to experience severe pain and instability episodes, recommend further evaluation with orthopedic surgery.  Patient is established with orthopedic surgery with history of right total knee replacement and will reach out to his orthopedic surgeon. - Recommend weightbearing as tolerated with goal of pain-free ambulation.  May use walker or cane as needed for assistance - Use Tylenol  500 to 1000 mg tablets 2-3 times a day for day-to-day pain relief  Pertinent previous records reviewed include left knee MRI 09/24/23  Follow Up: As needed   Subjective:   I, Chestine Reeves, am serving as a Neurosurgeon for Doctor Morene Mace   Chief Complaint: bilat knee and hand pain    HPI:    09/13/23 Patient is a 53 year old male with bilat knee and hand pain. Patient states he tripped over some bricks.  He felt a pop on his left knee. Hx of a fall last year. Hx of leaking bursa. Left leg is swollen    Bilat hand right hand is worst than left has some bruising on the thumb. Decreased ROM.he is dropping things , decrease in grip strength. No radiating pain. Numbness and tingling.     Right chest/rib pain. Decreased ROM. TTP through the chest and ribs. Has a little pain when taking a deep breath. He has been wrapping his chest to go to sleep through the night    09/27/2023 Patient states is not any better      Relevant Historical Information: Hypertension, NASH, GERD, history of gastric bypass  Additional pertinent review of systems negative.   Current Outpatient Medications:    acetaminophen  (TYLENOL ) 325 MG tablet, Take 2 tablets (650 mg total) by mouth 4 (four) times daily., Disp: , Rfl:    albuterol  (VENTOLIN  HFA) 108 (90 Base) MCG/ACT inhaler, Inhale 2 puffs into the lungs every 6 (six) hours as needed for wheezing or shortness of breath., Disp: 8 g, Rfl: 1   Baclofen  5 MG TABS, Take 1 tablet (5 mg total) by mouth 3 (three) times daily., Disp: 90 tablet, Rfl: 0   cyanocobalamin  (VITAMIN B12) 1000 MCG/ML injection, Inject 1 mL (1,000 mcg total) into the muscle every 30 (thirty) days., Disp: 3 mL, Rfl: 1   DULoxetine  (CYMBALTA ) 20 MG capsule, Take 20 mg by mouth 2 (two) times daily., Disp: , Rfl:    gabapentin  (NEURONTIN ) 600 MG tablet, Take 1.5 tablets (900 mg total) by mouth 3 (three) times daily., Disp: 135 tablet, Rfl: 0   magnesium  hydroxide (MILK OF MAGNESIA) 400 MG/5ML suspension, Take 30 mLs by mouth daily. (Patient taking differently: Take 30 mLs by mouth daily as needed.), Disp: 355 mL, Rfl: 0   morphine  (MS CONTIN ) 30 MG 12 hr tablet, Take 1 tablet (30 mg total) by mouth  every 12 (twelve) hours., Disp: 14 tablet, Rfl: 0   Multiple Vitamin (MULTIVITAMIN WITH MINERALS) TABS tablet, Take 1 tablet by mouth daily., Disp: 30 tablet, Rfl: 0   nystatin  (MYCOSTATIN ) 100000 UNIT/ML suspension, Take 5 mLs (500,000 Units total) by mouth 4 (four) times daily., Disp: 60 mL, Rfl: 3   Oxycodone  HCl 10 MG TABS, Take 10 mg by mouth., Disp: , Rfl:    predniSONE  (STERAPRED UNI-PAK 21 TAB) 10 MG (21) TBPK tablet, 12 day taper pack, use as directed, Disp: 21 tablet, Rfl: 0    rosuvastatin  (CRESTOR ) 10 MG tablet, Take 1 tablet (10 mg total) by mouth daily., Disp: 90 tablet, Rfl: 3   ZEPBOUND  2.5 MG/0.5ML injection vial, INJECT 0.5 ML (2.5 MG) UNDER THE SKIN ONCE WEEKLY (0.5ML= 50 UNITS), Disp: 2 mL, Rfl: 0   Objective:     Vitals:   09/27/23 1241  Pulse: 73  SpO2: 98%  Weight: 253 lb (114.8 kg)  Height: 5' 10 (1.778 m)      Body mass index is 36.3 kg/m.    Physical Exam:     General:  awake, alert oriented, no acute distress nontoxic Skin: no suspicious lesions or rashes Neuro:sensation intact and strength 5/5 with no deficits, no atrophy, normal muscle tone Psych: No signs of anxiety, depression or other mood disorder   Left knee: Bursal swelling superficial to tibial tuberosity No deformity Positive fluid wave, joint milking ROM Flex 100, Ext 0 TTP medial joint line, medial femoral condyle NTTP over the quad tendon, , lat fem condyle, patella, plica, patella tendon, tibial tuberostiy, fibular head, posterior fossa, pes anserine bursa, gerdy's tubercle,, lateral jt line   Gait antalgic, favoring right leg, using 4 pronged cane for ambulatory assistance    Electronically signed by:  [name] D[facility] 1:39 PM 09/27/23

## 2023-09-27 ENCOUNTER — Ambulatory Visit: Admitting: Sports Medicine

## 2023-09-27 VITALS — HR 73 | Ht 70.0 in | Wt 253.0 lb

## 2023-09-27 DIAGNOSIS — M25562 Pain in left knee: Secondary | ICD-10-CM | POA: Diagnosis not present

## 2023-09-27 DIAGNOSIS — S83207A Unspecified tear of unspecified meniscus, current injury, left knee, initial encounter: Secondary | ICD-10-CM | POA: Diagnosis not present

## 2023-09-27 DIAGNOSIS — M1712 Unilateral primary osteoarthritis, left knee: Secondary | ICD-10-CM

## 2023-09-27 NOTE — Patient Instructions (Signed)
 Recommend reaching out to ortho surgeon Recommend pain free walking using walker or cane for assistance As needed follow up

## 2023-10-08 DIAGNOSIS — S83249A Other tear of medial meniscus, current injury, unspecified knee, initial encounter: Secondary | ICD-10-CM | POA: Insufficient documentation

## 2023-10-08 DIAGNOSIS — S83242A Other tear of medial meniscus, current injury, left knee, initial encounter: Secondary | ICD-10-CM | POA: Diagnosis not present

## 2023-10-11 ENCOUNTER — Ambulatory Visit: Admitting: Sports Medicine

## 2023-10-13 ENCOUNTER — Other Ambulatory Visit: Payer: Self-pay | Admitting: Internal Medicine

## 2023-10-13 DIAGNOSIS — E66812 Obesity, class 2: Secondary | ICD-10-CM

## 2023-10-29 ENCOUNTER — Ambulatory Visit: Payer: Self-pay

## 2023-10-29 ENCOUNTER — Encounter: Payer: Self-pay | Admitting: Internal Medicine

## 2023-10-29 ENCOUNTER — Other Ambulatory Visit: Payer: Self-pay | Admitting: Internal Medicine

## 2023-10-29 ENCOUNTER — Ambulatory Visit (INDEPENDENT_AMBULATORY_CARE_PROVIDER_SITE_OTHER): Admitting: Internal Medicine

## 2023-10-29 VITALS — BP 158/86 | HR 67 | Temp 97.9°F | Resp 16 | Ht 70.0 in | Wt 230.0 lb

## 2023-10-29 DIAGNOSIS — E519 Thiamine deficiency, unspecified: Secondary | ICD-10-CM | POA: Diagnosis not present

## 2023-10-29 DIAGNOSIS — I10 Essential (primary) hypertension: Secondary | ICD-10-CM | POA: Diagnosis not present

## 2023-10-29 DIAGNOSIS — R8281 Pyuria: Secondary | ICD-10-CM | POA: Diagnosis not present

## 2023-10-29 DIAGNOSIS — N400 Enlarged prostate without lower urinary tract symptoms: Secondary | ICD-10-CM

## 2023-10-29 DIAGNOSIS — E66811 Obesity, class 1: Secondary | ICD-10-CM

## 2023-10-29 DIAGNOSIS — N401 Enlarged prostate with lower urinary tract symptoms: Secondary | ICD-10-CM | POA: Diagnosis not present

## 2023-10-29 DIAGNOSIS — E60 Dietary zinc deficiency: Secondary | ICD-10-CM

## 2023-10-29 DIAGNOSIS — D510 Vitamin B12 deficiency anemia due to intrinsic factor deficiency: Secondary | ICD-10-CM

## 2023-10-29 DIAGNOSIS — K7581 Nonalcoholic steatohepatitis (NASH): Secondary | ICD-10-CM

## 2023-10-29 DIAGNOSIS — R3912 Poor urinary stream: Secondary | ICD-10-CM | POA: Diagnosis not present

## 2023-10-29 DIAGNOSIS — R6882 Decreased libido: Secondary | ICD-10-CM

## 2023-10-29 DIAGNOSIS — N410 Acute prostatitis: Secondary | ICD-10-CM

## 2023-10-29 LAB — CBC WITH DIFFERENTIAL/PLATELET
Basophils Absolute: 0 K/uL (ref 0.0–0.1)
Basophils Relative: 0.6 % (ref 0.0–3.0)
Eosinophils Absolute: 0.1 K/uL (ref 0.0–0.7)
Eosinophils Relative: 2.1 % (ref 0.0–5.0)
HCT: 37 % — ABNORMAL LOW (ref 39.0–52.0)
Hemoglobin: 11.6 g/dL — ABNORMAL LOW (ref 13.0–17.0)
Lymphocytes Relative: 39.7 % (ref 12.0–46.0)
Lymphs Abs: 2.5 K/uL (ref 0.7–4.0)
MCHC: 31.4 g/dL (ref 30.0–36.0)
MCV: 82.2 fl (ref 78.0–100.0)
Monocytes Absolute: 0.7 K/uL (ref 0.1–1.0)
Monocytes Relative: 10.7 % (ref 3.0–12.0)
Neutro Abs: 2.9 K/uL (ref 1.4–7.7)
Neutrophils Relative %: 46.9 % (ref 43.0–77.0)
Platelets: 275 K/uL (ref 150.0–400.0)
RBC: 4.5 Mil/uL (ref 4.22–5.81)
RDW: 15.9 % — ABNORMAL HIGH (ref 11.5–15.5)
WBC: 6.2 K/uL (ref 4.0–10.5)

## 2023-10-29 LAB — URINALYSIS, ROUTINE W REFLEX MICROSCOPIC
Bilirubin Urine: NEGATIVE
Ketones, ur: NEGATIVE
Leukocytes,Ua: NEGATIVE
Nitrite: NEGATIVE
Specific Gravity, Urine: 1.025 (ref 1.000–1.030)
Total Protein, Urine: NEGATIVE
Urine Glucose: NEGATIVE
Urobilinogen, UA: 0.2 (ref 0.0–1.0)
pH: 6 (ref 5.0–8.0)

## 2023-10-29 LAB — BASIC METABOLIC PANEL WITH GFR
BUN: 13 mg/dL (ref 6–23)
CO2: 28 meq/L (ref 19–32)
Calcium: 8.5 mg/dL (ref 8.4–10.5)
Chloride: 105 meq/L (ref 96–112)
Creatinine, Ser: 0.82 mg/dL (ref 0.40–1.50)
GFR: 99.18 mL/min (ref >60.00)
Glucose, Bld: 86 mg/dL (ref 70–99)
Potassium: 4.3 meq/L (ref 3.5–5.1)
Sodium: 141 meq/L (ref 135–145)

## 2023-10-29 LAB — TSH: TSH: 1.78 u[IU]/mL (ref 0.35–5.50)

## 2023-10-29 LAB — PSA: PSA: 0.88 ng/mL (ref 0.10–4.00)

## 2023-10-29 MED ORDER — INDAPAMIDE 1.25 MG PO TABS: 1.2500 mg | ORAL_TABLET | Freq: Every day | ORAL | 0 refills | Status: DC

## 2023-10-29 MED ORDER — PHENTERMINE HCL 37.5 MG PO CAPS
37.5000 mg | ORAL_CAPSULE | ORAL | 0 refills | Status: DC
Start: 2023-10-29 — End: 2023-11-01

## 2023-10-29 NOTE — Progress Notes (Unsigned)
 Subjective:  Patient ID: Bradley [name], male    DOB: [birth year]  Age: 53 y.o. MRN: [id number]  CC: Hypertension and Anemia   HPI Bradley [name] presents for f/up ----  Discussed the use of AI scribe software for clinical note transcription with the patient, who gave verbal consent to proceed.  History of Present Illness Bradley [name] is a 53 year old male with hypertension who presents with persistent headaches and elevated blood pressure.  He has been experiencing headaches for the past week, coinciding with elevated blood pressure readings. Despite taking olmasartan, there was only a slight decrease in blood pressure, and the headaches persisted. He added irbesartan  300 mg yesterday, but the headaches remain unresolved.  No nausea, vomiting, chest pain, shortness of breath, or swelling in his legs or feet. He is not taking prednisone  or decongestants. He is taking meloxicam  for pain management.  He has a history of knee pain and has been taking meloxicam  for pain management. He has had previous MRIs and consultations for his knee, and was told by Doctor Leonce that he probably needs to get his knee replaced because of bone bruising and arthritis.  He previously used phentermine  for weight management but has not taken it in several months. A recent refill request was declined, and he is considering alternating between phentermine  and another weight management medication due to cost concerns.     Outpatient Medications Prior to Visit  Medication Sig Dispense Refill   acetaminophen  (TYLENOL ) 325 MG tablet Take 2 tablets (650 mg total) by mouth 4 (four) times daily.     albuterol  (VENTOLIN  HFA) 108 (90 Base) MCG/ACT inhaler Inhale 2 puffs into the lungs every 6 (six) hours as needed for wheezing or shortness of breath. 8 g 1   Baclofen  5 MG TABS Take 1 tablet (5 mg total) by mouth 3 (three) times daily. 90 tablet 0   cyanocobalamin  (VITAMIN B12) 1000 MCG/ML injection Inject 1 mL  (1,000 mcg total) into the muscle every 30 (thirty) days. 3 mL 1   DULoxetine  (CYMBALTA ) 20 MG capsule Take 20 mg by mouth 2 (two) times daily.     gabapentin  (NEURONTIN ) 600 MG tablet Take 1.5 tablets (900 mg total) by mouth 3 (three) times daily. 135 tablet 0   magnesium  hydroxide (MILK OF MAGNESIA) 400 MG/5ML suspension Take 30 mLs by mouth daily. (Patient taking differently: Take 30 mLs by mouth daily as needed.) 355 mL 0   morphine  (MS CONTIN ) 30 MG 12 hr tablet Take 1 tablet (30 mg total) by mouth every 12 (twelve) hours. 14 tablet 0   Multiple Vitamin (MULTIVITAMIN WITH MINERALS) TABS tablet Take 1 tablet by mouth daily. 30 tablet 0   nystatin  (MYCOSTATIN ) 100000 UNIT/ML suspension Take 5 mLs (500,000 Units total) by mouth 4 (four) times daily. 60 mL 3   Oxycodone  HCl 10 MG TABS Take 10 mg by mouth.     rosuvastatin  (CRESTOR ) 10 MG tablet Take 1 tablet (10 mg total) by mouth daily. 90 tablet 3   ZEPBOUND  2.5 MG/0.5ML injection vial INJECT 0.5 ML (2.5 MG) UNDER THE SKIN ONCE WEEKLY (0.5ML= 50 UNITS) 2 mL 0   predniSONE  (STERAPRED UNI-PAK 21 TAB) 10 MG (21) TBPK tablet 12 day taper pack, use as directed 21 tablet 0   No facility-administered medications prior to visit.    ROS Review of Systems  Constitutional:  Positive for fatigue. Negative for appetite change, chills, diaphoresis, fever and unexpected weight change.  HENT: Negative.  Negative for trouble swallowing.   Eyes: Negative.   Respiratory: Negative.  Negative for cough, chest tightness, shortness of breath and wheezing.   Cardiovascular:  Negative for chest pain, palpitations and leg swelling.  Gastrointestinal: Negative.  Negative for abdominal pain, constipation, diarrhea, nausea and vomiting.  Endocrine: Negative.   Genitourinary:  Positive for difficulty urinating. Negative for dysuria, frequency, hematuria and urgency.  Musculoskeletal:  Positive for arthralgias and back pain. Negative for joint swelling and myalgias.   Skin: Negative.   Neurological:  Positive for headaches. Negative for dizziness and weakness.  Hematological:  Negative for adenopathy. Does not bruise/bleed easily.  Psychiatric/Behavioral:  Positive for confusion and decreased concentration.     Objective:  BP (!) 158/86 (BP Location: Left Arm, Patient Position: Sitting, Cuff Size: Normal)   Pulse 67   Temp 97.9 F (36.6 C) (Oral)   Resp 16   Ht 5' 10 (1.778 m)   Wt 230 lb (104.3 kg)   SpO2 96%   BMI 33.00 kg/m   BP Readings from Last 3 Encounters:  10/29/23 (!) 158/86  09/13/23 132/84  08/13/23 112/76    Wt Readings from Last 3 Encounters:  10/29/23 230 lb (104.3 kg)  09/27/23 253 lb (114.8 kg)  09/13/23 253 lb (114.8 kg)    Physical Exam Vitals reviewed.  Constitutional:      Appearance: Normal appearance.  Eyes:     General: No scleral icterus.    Conjunctiva/sclera: Conjunctivae normal.  Cardiovascular:     Rate and Rhythm: Normal rate and regular rhythm.     Heart sounds: Murmur heard.     Systolic murmur is present with a grade of 2/6.     No friction rub. No gallop.  Pulmonary:     Effort: Pulmonary effort is normal.     Breath sounds: No stridor. No wheezing, rhonchi or rales.  Abdominal:     General: Abdomen is flat.     Palpations: There is no mass.     Tenderness: There is no abdominal tenderness. There is no guarding.     Hernia: No hernia is present.  Musculoskeletal:        General: Normal range of motion.     Cervical back: Neck supple.     Right lower leg: No edema.     Left lower leg: No edema.  Skin:    General: Skin is warm and dry.  Neurological:     General: No focal deficit present.     Mental Status: He is alert. Mental status is at baseline.  Psychiatric:        Mood and Affect: Mood normal.        Behavior: Behavior normal.     Lab Results  Component Value Date   WBC 6.2 10/29/2023   HGB 11.6 (L) 10/29/2023   HCT 37.0 (L) 10/29/2023   PLT 275.0 10/29/2023   GLUCOSE  86 10/29/2023   CHOL 152 01/02/2023   TRIG 95.0 01/02/2023   HDL 50.10 01/02/2023   LDLCALC 83 01/02/2023   ALT 25 08/13/2023   AST 28 08/13/2023   NA 141 10/29/2023   K 4.3 10/29/2023   CL 105 10/29/2023   CREATININE 0.82 10/29/2023   BUN 13 10/29/2023   CO2 28 10/29/2023   TSH 1.78 10/29/2023   PSA 0.88 10/29/2023   INR 1.0 01/02/2023   HGBA1C 5.6 06/15/2021    MR Knee Left  Wo Contrast Result Date: 09/25/2023 MR KNEE WITHOUT IV CONTRAST LEFT COMPARISON: None.  CLINICAL HISTORY: Knee pain and instability. PULSE SEQUENCES: Ax PD FS, Sag T2 ACL, Sag PD FS, Cor PD FS & COR T1 FINDINGS: Bones: There is mild tricompartmental osteoarthrosis with reactive edema in the medial femoral condyle medial tibial plateau. There is no visualized fracture. There is a small reactive joint effusion. Chondromalacia seen in the lateral patellofemoral compartment. Extensor mechanism is intact. There is a complex collection of the patellar tendon location measuring approximately 1 x 1.5 cm in size which could reflect a chronic hematoma or bursal collection. This is of uncertain clinical significance. Ligaments: The ACL, PCL, MCL and fibular collateral ligament are intact. Menisci: Lateral meniscus is intact. There is a radial tear at the root and posterior horn the medial meniscus. No displaced medial meniscal tear is present. IMPRESSION: Tricompartment osteoarthrosis with reactive edema in the medial femoral condyle medial tibial plateau. There is a radial tear of the posterior horn of the medial meniscus without displaced meniscal flap. Joint effusion is present. Small complex likely chronic prepatellar tendon collection as above. Electronically signed by: Norleen Satchel MD 09/25/2023 05:18 PM EDT RP Workstation: [id number]    Assessment & Plan:  Vitamin B12 deficiency anemia due to intrinsic factor deficiency -     CBC with Differential/Platelet; Future  Essential hypertension- Will try to achieve better BP  control. -     Basic metabolic panel with GFR; Future -     TSH; Future -     Indapamide ; Take 1 tablet (1.25 mg total) by mouth daily.  Dispense: 90 tablet; Refill: 0 -     amLODIPine  Besylate; Take 1 tablet (5 mg total) by mouth daily.  Dispense: 90 tablet; Refill: 0 -     Olmesartan  Medoxomil; Take 1 tablet (20 mg total) by mouth daily.  Dispense: 90 tablet; Refill: 0 -     AMB Referral [facility] Care Management  Thiamine  deficiency -     CBC with Differential/Platelet; Future -     Vitamin B-1; Take 1 tablet (50 mg total) by mouth daily.  Dispense: 90 tablet; Refill: 1  Zinc  deficiency -     CBC with Differential/Platelet; Future -     Zinc  Gluconate; Take 1 tablet (50 mg total) by mouth daily.  Dispense: 90 tablet; Refill: 0  Pyuria -     Urinalysis, Routine w reflex microscopic; Future -     CULTURE, URINE COMPREHENSIVE; Future  Low libido -     Testosterone  Total,Free,Bio, Males; Future  Benign prostatic hyperplasia with weak urinary stream -     PSA; Future  Acute prostatitis -     Sulfamethoxazole -Trimethoprim ; Take 1 tablet by mouth 2 (two) times daily for 21 days.  Dispense: 42 tablet; Refill: 0  NASH (nonalcoholic steatohepatitis)     Follow-up: Return in about 3 months (around 01/29/2024).  Debby Molt, MD

## 2023-10-29 NOTE — Patient Instructions (Signed)
 Hypertension, Adult High blood pressure (hypertension) is when the force of blood pumping through the arteries is too strong. The arteries are the blood vessels that carry blood from the heart throughout the body. Hypertension forces the heart to work harder to pump blood and may cause arteries to become narrow or stiff. Untreated or uncontrolled hypertension can lead to a heart attack, heart failure, a stroke, kidney disease, and other problems. A blood pressure reading consists of a higher number over a lower number. Ideally, your blood pressure should be below 120/80. The first ("top") number is called the systolic pressure. It is a measure of the pressure in your arteries as your heart beats. The second ("bottom") number is called the diastolic pressure. It is a measure of the pressure in your arteries as the heart relaxes. What are the causes? The exact cause of this condition is not known. There are some conditions that result in high blood pressure. What increases the risk? Certain factors may make you more likely to develop high blood pressure. Some of these risk factors are under your control, including: Smoking. Not getting enough exercise or physical activity. Being overweight. Having too much fat, sugar, calories, or salt (sodium) in your diet. Drinking too much alcohol. Other risk factors include: Having a personal history of heart disease, diabetes, high cholesterol, or kidney disease. Stress. Having a family history of high blood pressure and high cholesterol. Having obstructive sleep apnea. Age. The risk increases with age. What are the signs or symptoms? High blood pressure may not cause symptoms. Very high blood pressure (hypertensive crisis) may cause: Headache. Fast or irregular heartbeats (palpitations). Shortness of breath. Nosebleed. Nausea and vomiting. Vision changes. Severe chest pain, dizziness, and seizures. How is this diagnosed? This condition is diagnosed by  measuring your blood pressure while you are seated, with your arm resting on a flat surface, your legs uncrossed, and your feet flat on the floor. The cuff of the blood pressure monitor will be placed directly against the skin of your upper arm at the level of your heart. Blood pressure should be measured at least twice using the same arm. Certain conditions can cause a difference in blood pressure between your right and left arms. If you have a high blood pressure reading during one visit or you have normal blood pressure with other risk factors, you may be asked to: Return on a different day to have your blood pressure checked again. Monitor your blood pressure at home for 1 week or longer. If you are diagnosed with hypertension, you may have other blood or imaging tests to help your health care provider understand your overall risk for other conditions. How is this treated? This condition is treated by making healthy lifestyle changes, such as eating healthy foods, exercising more, and reducing your alcohol intake. You may be referred for counseling on a healthy diet and physical activity. Your health care provider may prescribe medicine if lifestyle changes are not enough to get your blood pressure under control and if: Your systolic blood pressure is above 130. Your diastolic blood pressure is above 80. Your personal target blood pressure may vary depending on your medical conditions, your age, and other factors. Follow these instructions at home: Eating and drinking  Eat a diet that is high in fiber and potassium, and low in sodium, added sugar, and fat. An example of this eating plan is called the DASH diet. DASH stands for Dietary Approaches to Stop Hypertension. To eat this way: Eat  plenty of fresh fruits and vegetables. Try to fill one half of your plate at each meal with fruits and vegetables. Eat whole grains, such as whole-wheat pasta, brown rice, or whole-grain bread. Fill about one  fourth of your plate with whole grains. Eat or drink low-fat dairy products, such as skim milk or low-fat yogurt. Avoid fatty cuts of meat, processed or cured meats, and poultry with skin. Fill about one fourth of your plate with lean proteins, such as fish, chicken without skin, beans, eggs, or tofu. Avoid pre-made and processed foods. These tend to be higher in sodium, added sugar, and fat. Reduce your daily sodium intake. Many people with hypertension should eat less than 1,500 mg of sodium a day. Do not drink alcohol if: Your health care provider tells you not to drink. You are pregnant, may be pregnant, or are planning to become pregnant. If you drink alcohol: Limit how much you have to: 0-1 drink a day for women. 0-2 drinks a day for men. Know how much alcohol is in your drink. In the [place] one drink equals one 12 oz bottle of beer (355 mL), one 5 oz glass of wine (148 mL), or one 1 oz glass of hard liquor (44 mL). Lifestyle  Work with your health care provider to maintain a healthy body weight or to lose weight. Ask what an ideal weight is for you. Get at least 30 minutes of exercise that causes your heart to beat faster (aerobic exercise) most days of the week. Activities may include walking, swimming, or biking. Include exercise to strengthen your muscles (resistance exercise), such as Pilates or lifting weights, as part of your weekly exercise routine. Try to do these types of exercises for 30 minutes at least 3 days a week. Do not use any products that contain nicotine or tobacco. These products include cigarettes, chewing tobacco, and vaping devices, such as e-cigarettes. If you need help quitting, ask your health care provider. Monitor your blood pressure at home as told by your health care provider. Keep all follow-up visits. This is important. Medicines Take over-the-counter and prescription medicines only as told by your health care provider. Follow directions carefully. Blood  pressure medicines must be taken as prescribed. Do not skip doses of blood pressure medicine. Doing this puts you at risk for problems and can make the medicine less effective. Ask your health care provider about side effects or reactions to medicines that you should watch for. Contact a health care provider if you: Think you are having a reaction to a medicine you are taking. Have headaches that keep coming back (recurring). Feel dizzy. Have swelling in your ankles. Have trouble with your vision. Get help right away if you: Develop a severe headache or confusion. Have unusual weakness or numbness. Feel faint. Have severe pain in your chest or abdomen. Vomit repeatedly. Have trouble breathing. These symptoms may be an emergency. Get help right away. Call 911. Do not wait to see if the symptoms will go away. Do not drive yourself to the hospital. Summary Hypertension is when the force of blood pumping through your arteries is too strong. If this condition is not controlled, it may put you at risk for serious complications. Your personal target blood pressure may vary depending on your medical conditions, your age, and other factors. For most people, a normal blood pressure is less than 120/80. Hypertension is treated with lifestyle changes, medicines, or a combination of both. Lifestyle changes include losing weight, eating a healthy,  low-sodium diet, exercising more, and limiting alcohol. This information is not intended to replace advice given to you by your health care provider. Make sure you discuss any questions you have with your health care provider. Document Revised: 12/28/2020 Document Reviewed: 12/28/2020 Elsevier Patient Education  2024 [employer].

## 2023-10-29 NOTE — Telephone Encounter (Signed)
 FYI Only or Action Required?: Action required by provider: request for appointment and clinical question for provider.  Patient was last seen in primary care on 08/13/2023 by Joshua Debby CROME, MD.  Called Nurse Triage reporting Hypertension.  Symptoms began several days ago.  Interventions attempted: Prescription medications: olmesartan  and irbesartan  that have been discontinued by Dr. Joshua.  Symptoms are: unchanged.  Triage Disposition: See Physician Within 24 Hours  Patient/caregiver understands and will follow disposition?: No, wishes to speak with PCP   Reason for Disposition  Systolic BP >= 180 OR Diastolic >= 110  Answer Assessment - Initial Assessment Questions Patient says his BP has been up this weekend. He says he started having headaches on Thursday, didn't check his BP until Saturday night and it was 212/133, P 75. He says he took Olmesartan  20 mg and it lowered it to 185/114 all between 10p-11p. I advised he has no BP medications listed on his current list. He says that his BP was low, so Dr. Joshua switched from [place] to Olmesartan . Advised it's not on his list either. He says I have a bottle that was filled in May, so he must have prescribed it. He says on Sunday morning his BP was  Sunday morning 169/104-took irbesartan  300 mg decreased 140's, last night 194/114. He is wanting to know if he should start back taking the irbesartan , because he feels it's stronger and lowered the BP better. Advised he will need an OV to discuss and evaluate the BP in the office that he can bring his BP monitor to compare the readings. He says he only wants to see Dr. Joshua. Advised soonest is on Thursday, he agreed to that appointment. He says he will just take the irbesartan  until Thursday and if the BP is better he will cancel. Advised he will still need to keep that appointment and he shouldn't take any medication not currently prescribed. Explained that once a provider stops a medication, it  is removed from the current list and that is all he should be taking, unless he receives a message from the provider to restart it, but as long as it's documented to stop, he shouldn't restart on his own. He says if I could just ask Dr. Joshua if he could come in sooner than Thursday and should he restart the irbesartan  300 mg or the olmesartan . Assess any other symptoms, he denies all other symptoms except headaches when the BP is over 180's. Advised if he doesn't receive a call back today by EOD it will be by tomorrow, go to the ED worsening headache or any other symptoms.    1. BLOOD PRESSURE: What is your blood pressure? Did you take at least two measurements 5 minutes apart?     Left arm 179/106 171/105 righ  2. ONSET: When did you take your blood pressure?     This morning 3. HOW: How did you take your blood pressure? (e.g., automatic home BP monitor, visiting nurse)     Automatic BP monitor 4. HISTORY: Do you have a history of high blood pressure?     Yes 5. MEDICINES: Are you taking any medicines for blood pressure? Have you missed any doses recently?     Not currently 6. OTHER SYMPTOMS: Do you have any symptoms? (e.g., blurred vision, chest pain, difficulty breathing, headache, weakness)     Headaches  Protocols used: Blood Pressure - High-A-AH  Copied from CRM #[id number]. Topic: Clinical - Red Word Triage >> Oct 29, 2023  8:57  AM Viola F wrote: Red Word that prompted transfer to Nurse Triage: Patient blood pressure was 170/106 this morning and has been having bad headaches for 3 days.  He wants to know if he should resume taking the irbesartan  medication - Dr. Joshua took him off of the medication but he still has it

## 2023-10-30 ENCOUNTER — Ambulatory Visit: Payer: Self-pay | Admitting: Internal Medicine

## 2023-10-31 ENCOUNTER — Encounter: Payer: Self-pay | Admitting: Internal Medicine

## 2023-10-31 LAB — CULTURE, URINE COMPREHENSIVE: RESULT:: NO GROWTH

## 2023-10-31 LAB — TESTOSTERONE TOTAL,FREE,BIO, MALES
Albumin: 3.6 g/dL (ref 3.6–5.1)
Sex Hormone Binding: 58 nmol/L — ABNORMAL HIGH (ref 10–50)
Testosterone, Bioavailable: 78.9 ng/dL — ABNORMAL LOW (ref 110.0–575.0)
Testosterone, Free: 47.4 pg/mL (ref 46.0–224.0)
Testosterone: 551 ng/dL (ref 250–827)

## 2023-10-31 NOTE — Telephone Encounter (Signed)
 Patient was seen in office on 10/29/2023 for his blood pressure.

## 2023-11-01 ENCOUNTER — Ambulatory Visit: Payer: Self-pay

## 2023-11-01 ENCOUNTER — Ambulatory Visit: Admitting: Internal Medicine

## 2023-11-01 ENCOUNTER — Encounter: Payer: Self-pay | Admitting: Internal Medicine

## 2023-11-01 ENCOUNTER — Other Ambulatory Visit: Payer: Self-pay | Admitting: Internal Medicine

## 2023-11-01 DIAGNOSIS — S83242A Other tear of medial meniscus, current injury, left knee, initial encounter: Secondary | ICD-10-CM | POA: Diagnosis not present

## 2023-11-01 DIAGNOSIS — N410 Acute prostatitis: Secondary | ICD-10-CM | POA: Insufficient documentation

## 2023-11-01 MED ORDER — OLMESARTAN MEDOXOMIL 20 MG PO TABS
20.0000 mg | ORAL_TABLET | Freq: Every day | ORAL | 0 refills | Status: DC
Start: 2023-11-01 — End: 2023-11-20

## 2023-11-01 MED ORDER — ZINC GLUCONATE 50 MG PO TABS: 50.0000 mg | ORAL_TABLET | Freq: Every day | ORAL | 0 refills | Status: DC

## 2023-11-01 MED ORDER — METHYLPREDNISOLONE 4 MG PO TBPK: ORAL_TABLET | ORAL | 0 refills | Status: DC

## 2023-11-01 MED ORDER — SULFAMETHOXAZOLE-TRIMETHOPRIM 800-160 MG PO TABS: 1.0000 | ORAL_TABLET | Freq: Two times a day (BID) | ORAL | 0 refills | Status: DC

## 2023-11-01 MED ORDER — VITAMIN B-1 50 MG PO TABS: 50.0000 mg | ORAL_TABLET | Freq: Every day | ORAL | 1 refills | Status: AC

## 2023-11-01 MED ORDER — AMLODIPINE BESYLATE 5 MG PO TABS: 5.0000 mg | ORAL_TABLET | Freq: Every day | ORAL | 0 refills | Status: DC

## 2023-11-01 NOTE — Telephone Encounter (Signed)
 FYI Only or Action Required?: Action required by provider: medication refill request. Pt requesting prednisone   Patient was last seen in primary care on 10/29/2023 by Bradley Debby CROME, MD.  Called Nurse Triage reporting Allergic Reaction.  Symptoms began yesterday.  Interventions attempted: Nothing.  Symptoms are: gradually worsening.  Triage Disposition: Go to ED Now (Notify PCP)  Patient/caregiver understands and will follow disposition?: No, wishes to speak with PCP  Copied from CRM #[id number]. Topic: Clinical - Red Word Triage >> Nov 01, 2023  8:20 AM Bradley [name] wrote: [facility] that prompted transfer to Nurse Triage: Patient called in stated he had an allergic reaction to the blood pressure medication that he was taken , sore throat , and mouth is swollen, would like for something to be called in for it Reason for Disposition  All other people with tongue swelling  (Exception: Tongue swelling is a recurrent problem AND NO swelling at present.)  Answer Assessment - Initial Assessment Questions 1. SYMPTOMS: What is the most serious symptom?     Mouth swelling, including tongue, states mouth is so swollen that he cannot wear his dentures.  2. AIRWAY: Are they breathing? (e.g., Yes, No)  (R/O: airway blockage, respiratory arrest)      Pt states he is not having difficulty breathing but states that he has a sore throat and ulcers in his mouth, tongue swelling 3. BREATHING: Is there difficulty breathing? (e.g., Yes, No; wheezing, unable to complete a sentence)  (R/O: respiratory distress)     denies 4. CIRCULATION: Are you feeling weak?  (e.g., Yes, No, Unknown; severity)  (R/O: shock) If Yes, ask: Can you stand and walk normally?     denies 7. SUBSTANCE: What are you reacting to? When did the contact occur?      New BP medication 8. PREVIOUS REACTION: Have you ever reacted to it before? If Yes, ask: What happened that time?     Pt states that he has had medication rxn in  the past and was given prednisone .   Pt was advised to go to the ED, pt refused. States I just want prescription for prednisone . Pt states that he is also having hives.  Protocols used: Anaphylaxis-A-AH, Tongue Swelling-A-AH

## 2023-11-01 NOTE — Telephone Encounter (Signed)
**Note De-identified  [place] Obfuscation** Please advise 

## 2023-11-02 ENCOUNTER — Ambulatory Visit: Payer: Self-pay

## 2023-11-02 NOTE — Telephone Encounter (Signed)
 FYI Only or Action Required?: Action required by provider: request for appointment and update on patient condition.  Patient was last seen in primary care on 10/29/2023 by Joshua Debby CROME, MD.  Called Nurse Triage reporting Possible Allergic Reaction.  Symptoms began prior to triage --approximately two days ago.  Interventions attempted: Other: spoke with pcp.  Symptoms are: gradually worsening.  Triage Disposition: Go to ED Now (Notify PCP)  Patient/caregiver understands and will follow disposition?:   Patient hung up prior to being advised the ER --He stated that he will go to Urgent Care since his PCP office had no openings          Copied from CRM #[id number]. Topic: Clinical - Red Word Triage >> Nov 02, 2023  9:20 AM [place] A wrote: [facility] that prompted transfer to Nurse Triage: Patient has swollen mouth, sore throat  that is very painful Reason for Disposition  Pain in tongue, mouth, or tooth  Answer Assessment - Initial Assessment Questions Last weekend started having bad headaches Saw Dr Joshua on Monday Patient started taking blood pressure medication and started having an allergic reaction---mouth broke out Patient denies difficulty breathing Yesterday patient states that Dr Joshua sent in medications to the Pharmacy and he believes that he is having a reaction to the Olmesartan   Patient states his entire body is itching Mouth hurts/irritated---Patient Access Specialist was told prior to speaking with Triage that the patient had swelling in his mouth but patient denies this during Triage. Patient states he is unable to wear his dentures Patient is advised that there are no openings in his PCP office today and patient states he will just go to Urgent Care and hung up on this RN  Protocols used: Tongue Swelling-A-AH

## 2023-11-02 NOTE — Telephone Encounter (Signed)
 Called [facility] to advise them of patient's symptoms at this time

## 2023-11-06 ENCOUNTER — Telehealth: Payer: Self-pay | Admitting: *Deleted

## 2023-11-06 NOTE — Progress Notes (Unsigned)
 Care Guide Pharmacy Note  11/06/2023 Name: Bradley [name] MRN: [id number] DOB: March 22, 1970  Referred By: Joshua Debby CROME, MD Reason for referral: Call Attempt #1 and Complex Care Management (Outreach to schedule referral with pharmacist )   Bradley [name] is a 53 y.o. year old male who is a primary care patient of Joshua Debby CROME, MD.  Bradley [name] was referred to the pharmacist for assistance related to: HTN  An unsuccessful telephone outreach was attempted today to contact the patient who was referred to the pharmacy team for assistance with medication management. Additional attempts will be made to contact the patient.  Bradley [name], CMA   [facility], [facility] Guide Direct Dial: [phone]  Fax: [phone] Website: [email or web address]

## 2023-11-07 ENCOUNTER — Other Ambulatory Visit (HOSPITAL_BASED_OUTPATIENT_CLINIC_OR_DEPARTMENT_OTHER): Payer: Self-pay

## 2023-11-07 ENCOUNTER — Encounter (HOSPITAL_BASED_OUTPATIENT_CLINIC_OR_DEPARTMENT_OTHER): Payer: Self-pay

## 2023-11-07 ENCOUNTER — Ambulatory Visit: Payer: Self-pay

## 2023-11-07 ENCOUNTER — Ambulatory Visit (HOSPITAL_BASED_OUTPATIENT_CLINIC_OR_DEPARTMENT_OTHER)
Admission: EM | Admit: 2023-11-07 | Discharge: 2023-11-07 | Disposition: A | Discharge status: Home / Self Care | Attending: Family Medicine | Admitting: Family Medicine

## 2023-11-07 DIAGNOSIS — R451 Restlessness and agitation: Secondary | ICD-10-CM

## 2023-11-07 DIAGNOSIS — T7840XA Allergy, unspecified, initial encounter: Secondary | ICD-10-CM

## 2023-11-07 MED ORDER — LIDOCAINE VISCOUS HCL 2 % MT SOLN
15.0000 mL | Freq: Four times a day (QID) | OROMUCOSAL | 0 refills | Status: AC | PRN
  Filled 2023-11-07: qty 100, 2d supply, fill #0

## 2023-11-07 MED ORDER — HYDROXYZINE HCL 25 MG PO TABS
25.0000 mg | ORAL_TABLET | Freq: Three times a day (TID) | ORAL | 0 refills | Status: AC | PRN
  Filled 2023-11-07: qty 12, 4d supply, fill #0

## 2023-11-07 NOTE — ED Triage Notes (Addendum)
 Pt went to see primary provider last Monday and was given Olmastartin for BP and he had an allergic reaction, mouth had ulcers, sore throat. Called pcp on Wednesday and Dc'd med. On Friday he began taking prednisone  and was put on antibiotic for prostate infection. Also started taking amlodipine  for BP. Started all three new meds on Friday. Yesterday pt reports skin feels like it's crawling and today he reports that his skin feels like it has fiberglass on it. He finished the course of prednisone  this morning. Pt has no visible rash, no SOB. Pt took benadryl  this morning with no relief.

## 2023-11-07 NOTE — Progress Notes (Signed)
 Care Guide Pharmacy Note  11/07/2023 Name: DEMONTRAE GILBERT MRN: [id number] DOB: 1970-12-12  Referred By: Joshua Debby CROME, MD Reason for referral: Call Attempt #1 and Complex Care Management (Outreach to schedule referral with pharmacist )   DERYL GIROUX is a 53 y.o. year old male who is a primary care patient of Joshua Debby CROME, MD.  [place]ell MARLA Rake was referred to the pharmacist for assistance related to: HTN  Successful contact was made with the patient to discuss pharmacy services including being ready for the pharmacist to call at least 5 minutes before the scheduled appointment time and to have medication bottles and any blood pressure readings ready for review. The patient agreed to meet with the pharmacist via telephone visit on 11/20/2023  Thedford Franks, CMA [place]  [facility], [facility] Guide Direct Dial: [phone]  Fax: [phone] Website: [email or web address]

## 2023-11-07 NOTE — Telephone Encounter (Signed)
 Ok, thanks.

## 2023-11-07 NOTE — Discharge Instructions (Addendum)
 I believe that you are having reactions to the medications that you are prescribed.  I would recommend not taking more of the olmesartan  or Bactrim .  It may take a few days for all of these medications to include the prednisone  to get out of your system.  I believe the prednisone  is causing the agitation.  I am prescribing some lidocaine  mouthwash to use as needed for mouth pain.  I am also prescribing hydroxyzine  to use as needed for her anxiety and skin irritation. Please follow-up with your doctor Continue to take the amlodipine  and monitor your blood pressure

## 2023-11-07 NOTE — ED Provider Notes (Signed)
 Bradley [name]    CSN: [id number] Arrival date & time: 11/07/23  1632      History   Chief Complaint No chief complaint on file.   HPI Bradley [name] is a 53 y.o. male.   Pt went to see primary provider last Monday and was given Olmastartin for BP and he had an allergic reaction, mouth had ulcers, sore throat. Called pcp on Wednesday and Dc'd med. On Friday he began taking prednisone  and was put on antibiotic for prostate infection. Also started taking amlodipine  for BP. Started all three new meds on Friday. Yesterday pt reports skin feels like it's crawling and today he reports that his skin feels like it has fiberglass on it. He finished the course of prednisone  this morning. Pt has no visible rash, no SOB. Pt took benadryl  this morning with no relief.       Past Medical History:  Diagnosis Date   Allergy    Anxiety    Aortic valve sclerosis    Arthritis    Arthropathy, unspecified, other specified sites    CAD (coronary artery disease)    01/09/20: patient denied; faint coronary calcifications on 2016 CT   Depression    GERD (gastroesophageal reflux disease)    Heart murmur    mild MR, mild AS 06/2019 (Dr. Vina Gull)   History of kidney stones    Hypertension      no longer have hypertension since gastric bypass    Obesity, unspecified    Other, mixed, or unspecified nondependent drug abuse, unspecified    Personal history of urinary calculi    Priapism     Patient Active Problem List   Diagnosis Date Noted   Acute prostatitis 11/01/2023   Pyuria 10/29/2023   Benign prostatic hyperplasia with weak urinary stream 10/29/2023   Thrush, oral 08/13/2023   Class 2 severe obesity due to excess calories with serious comorbidity and body mass index (BMI) of 36.0 to 36.9 in adult (HCC) 08/13/2023   Therapeutic opioid induced constipation 05/03/2022   Uncomplicated opioid dependence (HCC) 04/25/2022   Gastric bypass status for obesity 12/18/2021   Erectile  dysfunction due to arterial insufficiency 12/14/2021   Encounter for general adult medical examination with abnormal findings 12/12/2021   Class 1 obesity due to excess calories with serious comorbidity and body mass index (BMI) of 32.0 to 32.9 in adult 06/15/2021   Gastroesophageal reflux disease without esophagitis 04/19/2018   Thiamine  deficiency 04/14/2018   Other cirrhosis of liver (HCC) 03/13/2018   NASH (nonalcoholic steatohepatitis) 03/13/2018   Low libido 01/28/2018   Hyperlipidemia LDL goal <130 10/25/2017   Insomnia secondary to chronic pain 06/19/2017   Osteoarthritis of left knee 04/26/2017   Degenerative spondylolisthesis 02/24/2016   Cough variant asthma 02/06/2015   Zinc  deficiency 04/12/2014   B12 deficiency anemia 04/08/2014   Vitamin D  deficiency 07/26/2012   Anemia, iron deficiency 07/25/2012   Osteoarthritis of right knee 03/12/2007   Essential hypertension 10/08/2006   BACK PAIN, LUMBAR 10/08/2006    Past Surgical History:  Procedure Laterality Date   ANKLE SURGERY Left 2003   ANTERIOR LAT LUMBAR FUSION Left 02/24/2016   Procedure: LEFT LUMBAR TWO-THREE  ANTERIOR LATERAL LUMBAR FUSION  ;  Surgeon: Victory Gunnels, MD;  Location: [facility] OR;  Service: Neurosurgery;  Laterality: Left;   ANTERIOR LATERAL LUMBAR FUSION WITH PERCUTANEOUS SCREW 1 LEVEL Right 01/13/2020   Procedure: RIGHT LUMBAR ONE-TWO ANTERIOR LATERAL LUMBAR FUSION WITH PERCUTANEOUS SCREW PLACEMENT;  Surgeon: Gunnels Victory, MD;  Location: [facility] OR;  Service: Neurosurgery;  Laterality: Right;  3C   CHOLECYSTECTOMY N/A 01/16/2022   Procedure: LAPAROSCOPIC CHOLECYSTECTOMY WITH INTRAOPERATIVE CHOLANGIOGRAM AND ICG DYE;  Surgeon: Tanda Locus, MD;  Location: [facility] ORS;  Service: General;  Laterality: N/A;   GASTRIC BYPASS  2008   HERNIA REPAIR Left 2005   groin   INCISION AND DRAINAGE ABSCESS Right 07/06/2004   great toe   IRRIGATION AND DEBRIDEMENT ABSCESS Right 07/08/2004   great toe   KNEE ARTHROPLASTY Right  04/26/2017   Procedure: RIGHT TOTAL KNEE ARTHROPLASTY WITH COMPUTER NAVIGATION;  Surgeon: Fidel Rogue, MD;  Location: [facility] ORS;  Service: Orthopedics;  Laterality: Right;  Needs RNFA   KNEE SURGERY Right 2004   LUMBAR PERCUTANEOUS PEDICLE SCREW 1 LEVEL Left 02/24/2016   Procedure: LUMBAR PERCUTANEOUS PEDICLE SCREW 1 LEVEL;  Surgeon: Victory Gunnels, MD;  Location: [facility] OR;  Service: Neurosurgery;  Laterality: Left;   MICRODISCECTOMY LUMBAR Left 08/24/2008   T12 - L1   ORIF HUMERUS FRACTURE Right 04/17/2022   Procedure: OPEN REDUCTION INTERNAL FIXATION (ORIF) RIGHT DISTAL HUMERUS FRACTURE;  Surgeon: Cristy Bonner DASEN, MD;  Location: [facility] OR;  Service: Orthopedics;  Laterality: Right;   panectomy  2010   TOTAL KNEE ARTHROPLASTY Right 04/2017       Home Medications    Prior to Admission medications   Medication Sig Start Date End Date Taking? Authorizing Provider  hydrOXYzine  (ATARAX ) 25 MG tablet Take 1 tablet (25 mg total) by mouth every 8 (eight) hours as needed for itching or anxiety. 11/07/23  Yes Sehar Sedano A, FNP  lidocaine  (XYLOCAINE ) 2 % solution Use as directed 15 mLs in the mouth or throat every 6 (six) hours as needed for up to 5 days for mouth pain. 11/07/23 11/12/23 Yes Dvaughn Fickle A, FNP  acetaminophen  (TYLENOL ) 325 MG tablet Take 2 tablets (650 mg total) by mouth 4 (four) times daily. 05/01/22   Love, Sharlet RAMAN, PA-C  albuterol  (VENTOLIN  HFA) 108 (90 Base) MCG/ACT inhaler Inhale 2 puffs into the lungs every 6 (six) hours as needed for wheezing or shortness of breath. 07/10/23   Norleen Lynwood ORN, MD  amLODipine  (NORVASC ) 5 MG tablet Take 1 tablet (5 mg total) by mouth daily. 11/01/23   Joshua Debby CROME, MD  Baclofen  5 MG TABS Take 1 tablet (5 mg total) by mouth 3 (three) times daily. 05/01/22   Love, Sharlet RAMAN, PA-C  cyanocobalamin  (VITAMIN B12) 1000 MCG/ML injection Inject 1 mL (1,000 mcg total) into the muscle every 30 (thirty) days. 08/13/23   Joshua Debby CROME, MD  DULoxetine  (CYMBALTA ) 20 MG capsule Take  20 mg by mouth 2 (two) times daily.    [provider]  gabapentin  (NEURONTIN ) 600 MG tablet Take 1.5 tablets (900 mg total) by mouth 3 (three) times daily. 05/01/22   Love, Sharlet RAMAN, PA-C  indapamide  (LOZOL ) 1.25 MG tablet Take 1 tablet (1.25 mg total) by mouth daily. 10/29/23   Joshua Debby CROME, MD  magnesium  hydroxide (MILK OF MAGNESIA) 400 MG/5ML suspension Take 30 mLs by mouth daily. Patient taking differently: Take 30 mLs by mouth daily as needed. 05/01/22   Love, Sharlet RAMAN, PA-C  methylPREDNISolone  (MEDROL  DOSEPAK) 4 MG TBPK tablet TAKE AS DIRECTED 11/01/23   Joshua Debby CROME, MD  morphine  (MS CONTIN ) 30 MG 12 hr tablet Take 1 tablet (30 mg total) by mouth every 12 (twelve) hours. 05/01/22   Love, Sharlet RAMAN, PA-C  Multiple Vitamin (MULTIVITAMIN WITH MINERALS) TABS tablet Take 1 tablet by  mouth daily. 05/02/22   Love, Sharlet RAMAN, PA-C  nystatin  (MYCOSTATIN ) 100000 UNIT/ML suspension Take 5 mLs (500,000 Units total) by mouth 4 (four) times daily. 08/13/23   Joshua Debby CROME, MD  olmesartan  (BENICAR ) 20 MG tablet Take 1 tablet (20 mg total) by mouth daily. 11/01/23   Joshua Debby CROME, MD  Oxycodone  HCl 10 MG TABS Take 10 mg by mouth. 06/26/22   [provider]  rosuvastatin  (CRESTOR ) 10 MG tablet Take 1 tablet (10 mg total) by mouth daily. 06/22/23 10/29/23  Okey Vina GAILS, MD  sulfamethoxazole -trimethoprim  (BACTRIM  DS) 800-160 MG tablet Take 1 tablet by mouth 2 (two) times daily for 21 days. 11/01/23 11/22/23  Joshua Debby CROME, MD  thiamine  (VITAMIN B-1) 50 MG tablet Take 1 tablet (50 mg total) by mouth daily. 11/01/23   Joshua Debby CROME, MD  ZEPBOUND  2.5 MG/0.5ML injection vial INJECT 0.5 ML (2.5 MG) UNDER THE SKIN ONCE WEEKLY (0.5ML= 50 UNITS) 09/26/23   Joshua Debby CROME, MD  zinc  gluconate 50 MG tablet Take 1 tablet (50 mg total) by mouth daily. 11/01/23   Joshua Debby CROME, MD    Family History Family History  Problem Relation Age of Onset   Arthritis Mother    Colon cancer Mother    Breast cancer  Mother        twice   Kidney failure Mother    Heart disease Father    Hypertension Father    Esophageal cancer Neg Hx    Inflammatory bowel disease Neg Hx    Liver disease Neg Hx    Pancreatic cancer Neg Hx    Stomach cancer Neg Hx    Rectal cancer Neg Hx     Social History Social History   Tobacco Use   Smoking status: Never   Smokeless tobacco: Never  Vaping Use   Vaping status: Never Used  Substance Use Topics   Alcohol  use: No   Drug use: No     Allergies   Codeine, Oxycodone -acetaminophen , and Nsaids   Review of Systems Review of Systems   Physical Exam Triage Vital Signs ED Triage Vitals  Encounter Vitals Group     BP 11/07/23 1710 (!) 184/92     Girls Systolic BP Percentile --      Girls Diastolic BP Percentile --      Boys Systolic BP Percentile --      Boys Diastolic BP Percentile --      Pulse Rate 11/07/23 1710 81     Resp 11/07/23 1710 20     Temp 11/07/23 1710 98.3 F (36.8 C)     Temp src --      SpO2 11/07/23 1710 97 %     Weight --      Height --      Head Circumference --      Peak Flow --      Pain Score 11/07/23 1711 5     Pain Loc --      Pain Education --      Exclude from Growth Chart --    No data found.  Updated Vital Signs BP (!) 184/92 (BP Location: Right Arm)   Pulse 81   Temp 98.3 F (36.8 C)   Resp 20   SpO2 97%   Visual Acuity Right Eye Distance:   Left Eye Distance:   Bilateral Distance:    Right Eye Near:   Left Eye Near:    Bilateral Near:     Physical Exam  UC Treatments / Results  Labs (all labs ordered are listed, but only abnormal results are displayed) Labs Reviewed - No data to display  EKG   Radiology No results found.  Procedures Procedures (including critical [name] time)  Medications Ordered in UC Medications - No data to display  Initial Impression / Assessment and Plan / UC Course  I have reviewed the triage vital signs and the nursing notes.  Pertinent labs & imaging  results that were available during my [name] of the patient were reviewed by me and considered in my medical decision making (see chart for details).     *** Final Clinical Impressions(s) / UC Diagnoses   Final diagnoses:  Allergic reaction, initial encounter  Agitation     Discharge Instructions      I believe that you are having reactions to the medications that you are prescribed.  I would recommend not taking more of the olmesartan  or Bactrim .  It may take a few days for all of these medications to include the prednisone  to get out of your system.  I believe the prednisone  is causing the agitation.  I am prescribing some lidocaine  mouthwash to use as needed for mouth pain.  I am also prescribing hydroxyzine  to use as needed for her anxiety and skin irritation. Please follow-up with your doctor Continue to take the amlodipine  and monitor your blood pressure   ED Prescriptions     Medication Sig Dispense Auth. Provider   hydrOXYzine  (ATARAX ) 25 MG tablet Take 1 tablet (25 mg total) by mouth every 8 (eight) hours as needed for itching or anxiety. 12 tablet Gwenith Tschida A, FNP   lidocaine  (XYLOCAINE ) 2 % solution Use as directed 15 mLs in the mouth or throat every 6 (six) hours as needed for up to 5 days for mouth pain. 100 mL Adah Corning A, FNP      PDMP not reviewed this encounter.

## 2023-11-07 NOTE — Telephone Encounter (Signed)
 He was advised to go to the emergency department 5 days ago.  By now allergic reaction is either much better or much worse.  Which is it?  Is he doing better or worse?  Find out more details about his condition.  If he is doing better, does not need to do anything else.  If he is not, he needs to be seen.

## 2023-11-07 NOTE — Telephone Encounter (Signed)
 FYI Only or Action Required?: Action required by provider: update on patient condition.  Patient was last seen in primary care on 10/29/2023 by Joshua Debby CROME, MD.  Called Nurse Triage reporting Allergic Reaction.  Symptoms began several days ago.  Interventions attempted: Prescription medications: bactrim .  Symptoms are: unchanged.  Triage Disposition: Call PCP Within 24 Hours  Patient/caregiver understands and will follow disposition?: YesCopied from CRM #[id number]. Topic: Clinical - Red Word Triage >> Nov 07, 2023  1:24 PM Mesmerise C wrote: [facility] that prompted transfer to Nurse Triage: Patient stated he's having a reaction to an antibiotic stated it's like a fiberglass feeling proceeds to get worse Reason for Disposition  Taking prescription medication that could cause itching (e.g., codeine/morphine /other opiates, aspirin )  Answer Assessment - Initial Assessment Questions Pt called in on 8/29 with same concerns but didn't go to ED or stop taking medication. Denies SOB. Pt took dose of Bactrim  today. RN advised to stop taking Bactrim  until provider reviews this. Pt stated PCP is OOO until 9/9. RN notified CAL and spoke with Erin.      1. DESCRIPTION: Describe the itching you are having.     Playing in fiberglass 3. SCRATCHING: Are there any scratch marks? Bleeding?     yes 4. ONSET: When did this begin? (e.g., minutes, hours, days ago)      2 days ago 5. CAUSE: What do you think is causing the itching? (ask about swimming pools, pollen, animals, soaps, etc.)     Bactrim   6. OTHER SYMPTOMS: Do you have any other symptoms? (e.g., fever, rash)     headaches  Protocols used: Itching - [facility]

## 2023-11-07 NOTE — Telephone Encounter (Signed)
 Good afternoon!  Wanted to send this over to you as DOD. Seems patient has had an allergic reaction to the bactrim  prescribed on 08/25. Has since stopped med and was advised to go to ED last Friday but wanted to hear back from our office first

## 2023-11-08 NOTE — Telephone Encounter (Signed)
 This has been handled by Dr. Purcell as of 11/07/2022 and the patient was seen at [facility] for this issue.

## 2023-11-20 ENCOUNTER — Encounter: Payer: Self-pay | Admitting: Internal Medicine

## 2023-11-20 ENCOUNTER — Other Ambulatory Visit: Admitting: Pharmacist

## 2023-11-20 ENCOUNTER — Other Ambulatory Visit: Payer: Self-pay | Admitting: Internal Medicine

## 2023-11-20 DIAGNOSIS — I10 Essential (primary) hypertension: Secondary | ICD-10-CM

## 2023-11-20 NOTE — Patient Instructions (Signed)
 It was a pleasure speaking with you today!  Continue amlodipine  and indapamide . I will call back in 2 days to check on home blood pressure readings.  Feel free to call with any questions or concerns!  Darrelyn Drum, PharmD, BCPS, CPP Clinical Pharmacist Practitioner [place] [facility] at [facility] [phone]

## 2023-11-20 NOTE — Progress Notes (Signed)
 11/20/2023 Name: Bradley [name] MRN: [id number] DOB: Aug 26, 1970  Chief Complaint  Patient presents with   Hypertension   Medication Management    Bradley [name] is a 53 y.o. year old male who presented for a telephone visit.   They were referred to the pharmacist by their PCP for assistance in managing hypertension.   Subjective:  Care Team: Primary Care Provider: Joshua Debby CROME, MD ; Next Scheduled Visit: none scheduled  Medication Access/Adherence  Current Pharmacy:  [facility] - [place], [place] - [address] [place] [address] Phone: [phone] Fax: [phone]  [id number] - [place], [place] - [address] AT [facility] [address] [place] [address] Phone: [phone] Fax: [phone]  [facility] [place], [place] - [address] [phone] [place] [address] Phone: [phone] Fax: [phone]  [facility] - [place], [place] - [address] [phone] [place] [address] Phone: [phone] Fax: [phone]  [facility] [place] - [facility] [address], [address] [place] [address] Phone: [phone] Fax: [phone]   Patient reports affordability concerns with their medications: No  Patient reports access/transportation concerns to their pharmacy: No  Patient reports adherence concerns with their medications:  No    Confirmed pt did stop phenterimine  Hypertension:  Current medications: Amlodipine  5 mg daily, indapamide  1.25 mg daily (just started today) Medications previously tried: Olmesartan  (hives, mouth sores, mouth pain), irbesartan  (tolerated wel, d/c due to hypotension  Patient has a validated, automated, upper arm home BP cuff Current blood pressure readings readings: 170/111 this morning - sent message to PCP and was  advised to start indapamide . Has not been under 160s systolic  Patient denies hypotensive s/sx including dizziness, lightheadedness.  Patient reports hypertensive symptoms including headache  Current meal patterns: denies adding salt to food, has lost weight recently  Current physical activity: Goes to the gym 2 hours daily with a gym buddy   Objective:  Lab Results  Component Value Date   HGBA1C 5.6 06/15/2021    Lab Results  Component Value Date   CREATININE 0.82 10/29/2023   BUN 13 10/29/2023   NA 141 10/29/2023   K 4.3 10/29/2023   CL 105 10/29/2023   CO2 28 10/29/2023    Lab Results  Component Value Date   CHOL 152 01/02/2023   HDL 50.10 01/02/2023   LDLCALC 83 01/02/2023   TRIG 95.0 01/02/2023   CHOLHDL 3 01/02/2023    Medications Reviewed Today     Reviewed by Merceda Lela SAUNDERS, RPH (Pharmacist) on 11/20/23 at 1636  Med List Status: <None>   Medication Order Taking? Sig Documenting Provider Last Dose Status Informant  acetaminophen  (TYLENOL ) 325 MG tablet [id number]  Take 2 tablets (650 mg total) by mouth 4 (four) times daily. Maurice Sharlet RAMAN, PA-C  Active   albuterol  (VENTOLIN  HFA) 108 (90 Base) MCG/ACT inhaler [id number]  Inhale 2 puffs into the lungs every 6 (six) hours as needed for wheezing or shortness of breath. Norleen Lynwood ORN, MD  Active   amLODipine  (NORVASC ) 5 MG tablet [id number] Yes Take 1 tablet (5 mg total) by mouth daily. Joshua Debby CROME, MD  Active   Baclofen  5 MG TABS [id number] Yes Take 1 tablet (5 mg total) by mouth 3 (three) times daily. Maurice Sharlet RAMAN, PA-C  Active   cyanocobalamin  (VITAMIN B12) 1000 MCG/ML injection [id number]  Inject 1 mL (1,000 mcg total) into the muscle every 30 (thirty) days. Joshua Debby CROME, MD  Active   DULoxetine  (CYMBALTA ) 20 MG capsule [id number] Yes Take 20 mg by mouth 2 (two) times daily. [provider]  Active Self, Spouse/Significant Other, Pharmacy Records   Patient not taking:   Discontinued 11/20/23 1636  (Discontinued by provider)   hydrOXYzine  (ATARAX ) 25 MG tablet [id number]  Take 1 tablet (25 mg total) by mouth every 8 (eight) hours as needed for itching or anxiety. Adah Corning A, FNP  Active   indapamide  (LOZOL ) 1.25 MG tablet [id number] Yes Take 1 tablet (1.25 mg total) by mouth daily. Joshua Debby CROME, MD  Active   magnesium  hydroxide (MILK OF MAGNESIA) 400 MG/5ML suspension [id number]  Take 30 mLs by mouth daily.  Patient taking differently: Take 30 mLs by mouth daily as needed.   Maurice Sharlet RAMAN, PA-C  Active   morphine  (MS CONTIN ) 30 MG 12 hr tablet [id number] Yes Take 1 tablet (30 mg total) by mouth every 12 (twelve) hours. Maurice Sharlet RAMAN, PA-C  Active   Multiple Vitamin (MULTIVITAMIN WITH MINERALS) TABS tablet [id number]  Take 1 tablet by mouth daily. Maurice Sharlet RAMAN, PA-C  Active   nystatin  (MYCOSTATIN ) 100000 UNIT/ML suspension [id number]  Take 5 mLs (500,000 Units total) by mouth 4 (four) times daily. Joshua Debby CROME, MD  Active    Patient not taking:   Discontinued 11/20/23 1635 (Allergic reaction)   Oxycodone  HCl 10 MG TABS [id number] Yes Take 10 mg by mouth. [provider]  Active    Patient not taking:   Discontinued 11/20/23 1636 (Allergic reaction)     Discontinued 11/20/23 1423 (Completed Course)   thiamine  (VITAMIN B-1) 50 MG tablet [id number]  Take 1 tablet (50 mg total) by mouth daily. Joshua Debby CROME, MD  Active   ZEPBOUND  2.5 MG/0.5ML injection vial [id number]  INJECT 0.5 ML (2.5 MG) UNDER THE SKIN ONCE WEEKLY (0.5ML= 50 UNITS)  Patient not taking: Reported on 11/20/2023   Joshua Debby CROME, MD  Active   zinc  gluconate 50 MG tablet [id number]  Take 1 tablet (50 mg total) by mouth daily. Joshua Debby CROME, MD  Active               Assessment/Plan:   Hypertension: - Currently uncontrolled, BP goal <130/80 - Reviewed appropriate blood pressure monitoring technique and reviewed goal blood pressure. Recommended to check home blood pressure and heart rate  - Recommend to  continue indapamide  and amlodipine . Will call back in 2 days to determine if medication change is needed since indapamide  just restarted today.  - Likely will try irbesartan  if needed     Follow Up Plan: 9/18  Darrelyn Drum, PharmD, BCPS, CPP Clinical Pharmacist Practitioner [place] [facility] at [facility] [phone]

## 2023-11-22 ENCOUNTER — Other Ambulatory Visit

## 2023-11-22 DIAGNOSIS — I10 Essential (primary) hypertension: Secondary | ICD-10-CM

## 2023-11-22 MED ORDER — IRBESARTAN 150 MG PO TABS
150.0000 mg | ORAL_TABLET | Freq: Every day | ORAL | 0 refills | Status: AC
Start: 2023-11-22 — End: ?

## 2023-11-22 NOTE — Progress Notes (Signed)
 11/22/2023 Name: Bradley [name] MRN: [id number] DOB: 01/11/1971  Chief Complaint  Patient presents with   Hypertension   Medication Management    Bradley [name] is [name] 53 y.o. year old male who presented for [name] telephone visit.   They were referred to the pharmacist by their PCP for assistance in managing hypertension.   Subjective:  Care Team: Primary Care Provider: Joshua Debby CROME, MD ; Next Scheduled Visit: none scheduled  Medication Access/Adherence  Current Pharmacy:  [facility], [place] - [address] [place] [address] Phone: [phone] Fax: [phone]  [id number] - [place], [place] - [name] AT [facility] [name] [place] [address] Phone: [phone] Fax: [phone]  [name] [place], [place] - [address] [phone] [place] [address] Phone: [phone] Fax: [phone]  [facility] - [place], [place] - [address] [phone] [place] [address] Phone: [phone] Fax: [phone]  [facility] [place] - [facility] [address] [place] [address] Phone: [phone] Fax: [phone]   Patient reports affordability concerns with their medications: No  Patient reports access/transportation concerns to their pharmacy: No  Patient reports adherence concerns with their medications:  No    Confirmed pt did stop phenterimine  Hypertension:  Current medications: Amlodipine  5 mg daily, indapamide  1.25 mg daily  Medications previously tried: Olmesartan  (hives, mouth sores, mouth pain), irbesartan  (tolerated well, d/c due to hypotension)  Patient has [name] validated, automated, upper arm home BP cuff Current blood pressure readings readings: pt reports Bps have been about the same (160-170 systolic)  Patient  denies hypotensive [name]/sx including dizziness, lightheadedness.  Patient reports hypertensive symptoms including headache  Current meal patterns: denies adding salt to food, has lost weight recently  Current physical activity: Goes to the gym 2 hours daily with [name] gym buddy   Objective:  Lab Results  Component Value Date   HGBA1C 5.6 06/15/2021    Lab Results  Component Value Date   CREATININE 0.82 10/29/2023   BUN 13 10/29/2023   NA 141 10/29/2023   K 4.3 10/29/2023   CL 105 10/29/2023   CO2 28 10/29/2023    Lab Results  Component Value Date   CHOL 152 01/02/2023   HDL 50.10 01/02/2023   LDLCALC 83 01/02/2023   TRIG 95.0 01/02/2023   CHOLHDL 3 01/02/2023    Medications Reviewed Today     Reviewed by Bradley [name], RPH (Pharmacist) on 11/22/23 at 1049  Med List Status: <None>   Medication Order Taking? Sig Documenting Provider Last Dose Status Informant  acetaminophen  (TYLENOL ) 325 MG tablet [id number]  Take 2 tablets (650 mg total) by mouth 4 (four) times daily. Bradley Sharlet RAMAN, PA-C  Active   albuterol  (VENTOLIN  HFA) 108 (90 Base) MCG/ACT inhaler [id number]  Inhale 2 puffs into the lungs every 6 (six) hours as needed for wheezing or shortness of breath. Bradley Lynwood ORN, MD  Active   amLODipine  (NORVASC ) 5 MG tablet [id number] Yes Take 1 tablet (5 mg total) by mouth daily. Bradley Debby CROME, MD  Active   Baclofen  5 MG TABS [id number]  Take 1 tablet (5 mg total) by mouth 3 (three) times daily. Bradley Sharlet RAMAN, PA-C  Active   cyanocobalamin  (VITAMIN B12) 1000 MCG/ML injection [id number]  Inject 1 mL (1,000 mcg total) into the muscle every 30 (thirty) days. Bradley Debby CROME, MD  Active   DULoxetine  (CYMBALTA ) 20 MG capsule [id number]  Take 20 mg by mouth 2 (two) times daily. [provider]  Active Self, Spouse/Significant Other, Pharmacy Records  hydrOXYzine  (ATARAX ) 25 MG tablet [id number]  Take 1 tablet (25 mg total) by mouth every 8 (eight) hours as needed for itching or  anxiety. Bradley Corning A, FNP  Active   indapamide  (LOZOL ) 1.25 MG tablet [id number] Yes Take 1 tablet (1.25 mg total) by mouth daily. Bradley Debby CROME, MD  Active   magnesium  hydroxide (MILK OF MAGNESIA) 400 MG/5ML suspension [id number]  Take 30 mLs by mouth daily.  Patient taking differently: Take 30 mLs by mouth daily as needed.   Love, Pamela S, PA-C  Active   morphine  (MS CONTIN ) 30 MG 12 hr tablet [id number]  Take 1 tablet (30 mg total) by mouth every 12 (twelve) hours. Bradley Sharlet RAMAN, PA-C  Active   Multiple Vitamin (MULTIVITAMIN WITH MINERALS) TABS tablet [id number]  Take 1 tablet by mouth daily. Bradley Sharlet RAMAN, PA-C  Active   nystatin  (MYCOSTATIN ) 100000 UNIT/ML suspension [id number]  Take 5 mLs (500,000 Units total) by mouth 4 (four) times daily. Bradley Debby CROME, MD  Active   Oxycodone  HCl 10 MG TABS [id number]  Take 10 mg by mouth. [provider]  Active   thiamine  (VITAMIN B-1) 50 MG tablet [id number]  Take 1 tablet (50 mg total) by mouth daily. Bradley Debby CROME, MD  Active   ZEPBOUND  2.5 MG/0.5ML injection vial [id number]  INJECT 0.5 ML (2.5 MG) UNDER THE SKIN ONCE WEEKLY (0.5ML= 50 UNITS)  Patient not taking: Reported on 11/20/2023   Bradley Debby CROME, MD  Active   zinc  gluconate 50 MG tablet [id number]  Take 1 tablet (50 mg total) by mouth daily. Bradley Debby CROME, MD  Active               Assessment/Plan:   Hypertension: - Currently uncontrolled, BP goal <130/80 - Reviewed appropriate blood pressure monitoring technique and reviewed goal blood pressure. Recommended to check home blood pressure and heart rate  - Recommend to continue indapamide  and amlodipine .  - Start irbesartan  150 mg daily (pt has tolerated in the past)   Follow Up Plan: 9/23  Bradley [name], PharmD, BCPS, CPP Clinical Pharmacist Practitioner [place] [facility] at [facility] [phone]

## 2023-11-22 NOTE — Patient Instructions (Signed)
 It was a pleasure speaking with you today!  Start irbesartan  150 mg daily. Continue amlodipine  and indapamide .  I will call back next week on Tuesday to check on your blood pressure readings.  Feel free to call with any questions or concerns!  Darrelyn Drum, PharmD, BCPS, CPP Clinical Pharmacist Practitioner [place] [facility] at [facility] [phone]

## 2023-11-27 ENCOUNTER — Other Ambulatory Visit (INDEPENDENT_AMBULATORY_CARE_PROVIDER_SITE_OTHER): Admitting: Pharmacist

## 2023-11-27 VITALS — BP 118/82

## 2023-11-27 DIAGNOSIS — I10 Essential (primary) hypertension: Secondary | ICD-10-CM

## 2023-11-27 NOTE — Progress Notes (Signed)
 11/27/2023 Name: Bradley [name] MRN: [id number] DOB: 1970-10-20  Chief Complaint  Patient presents with   Hypertension   Medication Management    Bradley [name] is a 53 y.o. year old male who presented for a telephone visit.   They were referred to the pharmacist by their PCP for assistance in managing hypertension.   Subjective:  Care Team: Primary Care Provider: Joshua Debby CROME, MD ; Next Scheduled Visit: none scheduled  Medication Access/Adherence  Current Pharmacy:  [facility], [place] - [address] [place] [address] Phone: [phone] Fax: [phone]  [id number] - [place], [place] - [address] AT [facility] [address] [place] [address] Phone: [phone] Fax: [phone]  [facility] [place] - [address] [phone] [place] [address] Phone: [phone] Fax: [phone]  [facility] - [place], [place] - [address] [phone] [place] [address] Phone: [phone] Fax: [phone]  [facility] [place] - [facility] [address] [place] [address] Phone: [phone] Fax: [phone]   Patient reports affordability concerns with their medications: No  Patient reports access/transportation concerns to their pharmacy: No  Patient reports adherence concerns with their medications:  No    Confirmed pt did stop phenterimine  Hypertension:  Current medications: Amlodipine  5 mg daily, indapamide  1.25 mg daily, irbesartan  150 mg daily Medications previously tried: Olmesartan  (hives, mouth sores, mouth pain)  Patient has a validated, automated, upper arm home BP cuff Current blood pressure readings readings: Sitting 118/82, standing decreased 99/78  Patient reports hypotensive s/sx including dizziness,  lightheadedness when standing, pt reports sleepiness Patient denies hypertensive symptoms including headache  Current meal patterns: denies adding salt to food, has lost weight recently  Current physical activity: Goes to the gym 2 hours daily with a gym buddy   Objective:  Lab Results  Component Value Date   HGBA1C 5.6 06/15/2021    Lab Results  Component Value Date   CREATININE 0.82 10/29/2023   BUN 13 10/29/2023   NA 141 10/29/2023   K 4.3 10/29/2023   CL 105 10/29/2023   CO2 28 10/29/2023    Lab Results  Component Value Date   CHOL 152 01/02/2023   HDL 50.10 01/02/2023   LDLCALC 83 01/02/2023   TRIG 95.0 01/02/2023   CHOLHDL 3 01/02/2023    Medications Reviewed Today     Reviewed by Merceda Lela SAUNDERS, RPH (Pharmacist) on 11/27/23 at 1601  Med List Status: <None>   Medication Order Taking? Sig Documenting Provider Last Dose Status Informant  acetaminophen  (TYLENOL ) 325 MG tablet [id number]  Take 2 tablets (650 mg total) by mouth 4 (four) times daily. Love, Pamela S, PA-C  Active   albuterol  (VENTOLIN  HFA) 108 (90 Base) MCG/ACT inhaler [id number]  Inhale 2 puffs into the lungs every 6 (six) hours as needed for wheezing or shortness of breath. Norleen Lynwood ORN, MD  Active   amLODipine  (NORVASC ) 5 MG tablet [id number] Yes Take 1 tablet (5 mg total) by mouth daily. Joshua Debby CROME, MD  Active   Baclofen  5 MG TABS [id number]  Take 1 tablet (5 mg total) by mouth 3 (three) times daily. Maurice Sharlet RAMAN, PA-C  Active   cyanocobalamin  (VITAMIN B12) 1000 MCG/ML injection [id number]  Inject 1 mL (1,000 mcg  total) into the muscle every 30 (thirty) days. Joshua Debby CROME, MD  Active   DULoxetine  (CYMBALTA ) 20 MG capsule [id number]  Take 20 mg by mouth 2 (two) times daily. [provider]  Active Self, Spouse/Significant Other, Pharmacy Records  hydrOXYzine  (ATARAX ) 25 MG tablet [id number]  Take 1 tablet (25 mg total) by mouth every 8 (eight) hours as needed for itching or anxiety.  Adah Corning A, FNP  Active   indapamide  (LOZOL ) 1.25 MG tablet [id number] Yes Take 1 tablet (1.25 mg total) by mouth daily. Joshua Debby CROME, MD  Active   irbesartan  (AVAPRO ) 150 MG tablet [id number] Yes Take 1 tablet (150 mg total) by mouth daily. Joshua Debby CROME, MD  Active   magnesium  hydroxide (MILK OF MAGNESIA) 400 MG/5ML suspension [id number]  Take 30 mLs by mouth daily.  Patient taking differently: Take 30 mLs by mouth daily as needed.   Maurice Sharlet RAMAN, PA-C  Active   morphine  (MS CONTIN ) 30 MG 12 hr tablet [id number]  Take 1 tablet (30 mg total) by mouth every 12 (twelve) hours. Maurice Sharlet RAMAN, PA-C  Active   Multiple Vitamin (MULTIVITAMIN WITH MINERALS) TABS tablet [id number]  Take 1 tablet by mouth daily. Maurice Sharlet RAMAN, PA-C  Active   nystatin  (MYCOSTATIN ) 100000 UNIT/ML suspension [id number]  Take 5 mLs (500,000 Units total) by mouth 4 (four) times daily. Joshua Debby CROME, MD  Active   Oxycodone  HCl 10 MG TABS [id number]  Take 10 mg by mouth. [provider]  Active   thiamine  (VITAMIN B-1) 50 MG tablet [id number]  Take 1 tablet (50 mg total) by mouth daily. Joshua Debby CROME, MD  Active   ZEPBOUND  2.5 MG/0.5ML injection vial [id number]  INJECT 0.5 ML (2.5 MG) UNDER THE SKIN ONCE WEEKLY (0.5ML= 50 UNITS)  Patient not taking: Reported on 11/20/2023   Joshua Debby CROME, MD  Active   zinc  gluconate 50 MG tablet [id number]  Take 1 tablet (50 mg total) by mouth daily. Joshua Debby CROME, MD  Active               Assessment/Plan:   Hypertension: - Currently controlled, BP goal <130/80 - Reviewed appropriate blood pressure monitoring technique and reviewed goal blood pressure. Recommended to check home blood pressure and heart rate  - Recommend to continue indapamide , irbesartan , amlodipine . Will give BP some time to normalize to see if dizziness/fatigue improves. If BP becomes lower or dizziness/fatigue worsens, will recommend d/c indapamide .   Follow Up Plan: 9/26  Darrelyn Drum,  PharmD, BCPS, CPP Clinical Pharmacist Practitioner [place] [facility] [phone]

## 2023-11-27 NOTE — Patient Instructions (Signed)
 It was a pleasure speaking with you today!  Continue amlodipine , irbesartan , and indapamide . Keep monitoring blood pressure. I will follow up in a few days.  Feel free to call with any questions or concerns!  Darrelyn Drum, PharmD, BCPS, CPP Clinical Pharmacist Practitioner [place] [facility] at [facility] [phone]

## 2023-11-30 ENCOUNTER — Other Ambulatory Visit

## 2023-11-30 DIAGNOSIS — I10 Essential (primary) hypertension: Secondary | ICD-10-CM

## 2023-11-30 NOTE — Progress Notes (Signed)
   11/30/2023 Name: Bradley [name] MRN: [id number] DOB: 02-Dec-1970  Chief Complaint  Patient presents with   Hypertension   Medication Management    Bradley [name] is a 53 y.o. year old male who presented for a telephone visit.   They were referred to the pharmacist by their PCP for assistance in managing hypertension.   Subjective:  Care Team: Primary Care Provider: Joshua Debby CROME, MD ; Next Scheduled Visit: none scheduled  Medication Access/Adherence  Current Pharmacy:  [facility], [place] - [address] [place] [address] Phone: [phone] Fax: [phone]  [id number] - [place], [place] - [address] AT [facility] OF [address] [place] [address] Phone: [phone] Fax: [phone]  [facility] [place], [place] - [address] [phone] [place] [address] Phone: [phone] Fax: [phone]  [facility] - [place], [place] - [phone] [place] [address] Phone: [phone] Fax: [phone]  [facility] [place] [facility] [address] [place] [address] Phone: [phone] Fax: [phone]   Patient reports affordability concerns with their medications: No  Patient reports access/transportation concerns to their pharmacy: No  Patient reports adherence concerns with their medications:  No    Confirmed pt did stop phenterimine  Hypertension:  Current medications: Amlodipine  5 mg daily, indapamide  1.25 mg daily, irbesartan  150 mg daily Medications previously tried: Olmesartan  (hives, mouth sores, mouth pain)  Patient has a validated, automated, upper arm home BP cuff Current blood pressure readings readings: Pt reports BP have remained below goal and lows have improved  Patient denies hypotensive s/sx  including dizziness, lightheadedness when standing Patient denies hypertensive symptoms including headache  Current meal patterns: denies adding salt to food, has lost weight recently  Current physical activity: Goes to the gym 2 hours daily with a gym buddy   Objective:  Lab Results  Component Value Date   HGBA1C 5.6 06/15/2021    Lab Results  Component Value Date   CREATININE 0.82 10/29/2023   BUN 13 10/29/2023   NA 141 10/29/2023   K 4.3 10/29/2023   CL 105 10/29/2023   CO2 28 10/29/2023    Lab Results  Component Value Date   CHOL 152 01/02/2023   HDL 50.10 01/02/2023   LDLCALC 83 01/02/2023   TRIG 95.0 01/02/2023   CHOLHDL 3 01/02/2023    Medications Reviewed Today   Medications were not reviewed in this encounter       Assessment/Plan:   Hypertension: - Currently controlled, BP goal <130/80 - Reviewed appropriate blood pressure monitoring technique and reviewed goal blood pressure. Recommended to check home blood pressure and heart rate  - Recommend to continue indapamide , irbesartan , amlodipine .  - Recommend updating BMP to check Scr and K after irbesartan  initiation.   Follow Up Plan: 10/6 lab  Darrelyn Drum, PharmD, BCPS, CPP Clinical Pharmacist Practitioner [facility] [phone]

## 2023-11-30 NOTE — Patient Instructions (Signed)
 It was a pleasure speaking with you today!  Come 10/6 for walk in lab.  Continue your current regimen.  Feel free to call with any questions or concerns!  Darrelyn Drum, PharmD, BCPS, CPP Clinical Pharmacist Practitioner [facility] at [facility] [phone]

## 2023-12-03 ENCOUNTER — Encounter: Payer: Self-pay | Admitting: Internal Medicine

## 2023-12-03 NOTE — Progress Notes (Signed)
 Pharmacy Quality Measure Review  This patient is appearing on a report for being at risk of failing the adherence measure for hypertension (ACEi/ARB) medications this calendar year.   Medication: Olmesartan  20mg  Last fill date: 04/20 for 90 day supply  Allergic reaction to olmesartan  documented 11/20/23. Prescribed irbesartan  11/22/23. No intervention needed at this time.  Angela Baalmann, PharmD [facility] Pharmacist

## 2023-12-10 ENCOUNTER — Other Ambulatory Visit

## 2023-12-10 DIAGNOSIS — I10 Essential (primary) hypertension: Secondary | ICD-10-CM

## 2023-12-11 ENCOUNTER — Ambulatory Visit: Payer: Self-pay | Admitting: Pharmacist

## 2023-12-11 LAB — BASIC METABOLIC PANEL WITH GFR
BUN: 15 mg/dL (ref 6–23)
CO2: 29 meq/L (ref 19–32)
Calcium: 8.5 mg/dL (ref 8.4–10.5)
Chloride: 101 meq/L (ref 96–112)
Creatinine, Ser: 1.1 mg/dL (ref 0.40–1.50)
GFR: 75.73 mL/min (ref >60.00)
Glucose, Bld: 83 mg/dL (ref 70–99)
Potassium: 5 meq/L (ref 3.5–5.1)
Sodium: 137 meq/L (ref 135–145)

## 2023-12-13 DIAGNOSIS — S83242D Other tear of medial meniscus, current injury, left knee, subsequent encounter: Secondary | ICD-10-CM | POA: Diagnosis not present

## 2023-12-15 ENCOUNTER — Other Ambulatory Visit: Payer: Self-pay | Admitting: Internal Medicine

## 2023-12-15 DIAGNOSIS — E6609 Other obesity due to excess calories: Secondary | ICD-10-CM

## 2023-12-15 MED ORDER — PHENTERMINE HCL 37.5 MG PO TABS: 37.5000 mg | ORAL_TABLET | Freq: Every day | ORAL | 1 refills | Status: DC

## 2023-12-24 ENCOUNTER — Other Ambulatory Visit: Payer: Self-pay | Admitting: Internal Medicine

## 2023-12-24 DIAGNOSIS — B370 Candidal stomatitis: Secondary | ICD-10-CM

## 2023-12-25 ENCOUNTER — Ambulatory Visit (HOSPITAL_BASED_OUTPATIENT_CLINIC_OR_DEPARTMENT_OTHER)
Admission: EM | Admit: 2023-12-25 | Discharge: 2023-12-25 | Disposition: A | Discharge status: Home / Self Care | Attending: Family Medicine | Admitting: Family Medicine

## 2023-12-25 ENCOUNTER — Encounter (HOSPITAL_BASED_OUTPATIENT_CLINIC_OR_DEPARTMENT_OTHER): Payer: Self-pay

## 2023-12-25 ENCOUNTER — Other Ambulatory Visit (HOSPITAL_BASED_OUTPATIENT_CLINIC_OR_DEPARTMENT_OTHER): Payer: Self-pay

## 2023-12-25 DIAGNOSIS — R208 Other disturbances of skin sensation: Secondary | ICD-10-CM

## 2023-12-25 MED ORDER — FLUCONAZOLE 150 MG PO TABS
150.0000 mg | ORAL_TABLET | Freq: Every day | ORAL | 0 refills | Status: AC
  Filled 2023-12-25: qty 7, 7d supply, fill #0

## 2023-12-25 MED ORDER — CHLORHEXIDINE GLUCONATE 0.12 % MT SOLN
15.0000 mL | Freq: Two times a day (BID) | OROMUCOSAL | 0 refills | Status: AC
  Filled 2023-12-25: qty 473, 16d supply, fill #0

## 2023-12-25 NOTE — Discharge Instructions (Addendum)
 There can be many causes for this problem.  This could be due to your dentures and poor fitting dentures.  It also could be due to side effects of medications to include blood pressure medication or dry mouth.  Some things that can irritate the mouth are spicy foods, alcohol  based mouthwashes and tobacco.  We can try to treat for bacterial infection that could also be the cause.  Low B12 can cause this and acid reflux. We will try some antibacterial mouthwash and diflucan oral tabs for yeast. If this does not resolve you may want to see your denture specialist or PCP and speak about potential reactions from blood pressure medication. You may need some blood work.

## 2023-12-25 NOTE — ED Provider Notes (Signed)
 [name]    CSN: [id number] Arrival date & time: 12/25/23  1216      History   Chief Complaint Chief Complaint  Patient presents with   Tonngue pain   Burning lips    HPI Bradley [name] is a 53 y.o. male.   Pt is a 53 year old male that presents with mouth burning. Patient recently treated for thrush. States return of discomfort to surface of tongue and inside of lips. Does wear dentures. Was approved to have a refill of his nystatin  that was sent yesterday. Patient states wanted someone to see him before starting the nystatin . Feel like constant film in mouth. No other symptoms. No fever.       Past Medical History:  Diagnosis Date   Allergy    Anxiety    Aortic valve sclerosis    Arthritis    Arthropathy, unspecified, other specified sites    CAD (coronary artery disease)    01/09/20: patient denied; faint coronary calcifications on 2016 CT   Depression    GERD (gastroesophageal reflux disease)    Heart murmur    mild MR, mild AS 06/2019 (Dr. Vina Gull)   History of kidney stones    Hypertension      no longer have hypertension since gastric bypass    Obesity, unspecified    Other, mixed, or unspecified nondependent drug abuse, unspecified    Personal history of urinary calculi    Priapism     Patient Active Problem List   Diagnosis Date Noted   Pyuria 10/29/2023   Benign prostatic hyperplasia with weak urinary stream 10/29/2023   Thrush, oral 08/13/2023   Class 2 severe obesity due to excess calories with serious comorbidity and body mass index (BMI) of 36.0 to 36.9 in adult 08/13/2023   Therapeutic opioid induced constipation 05/03/2022   Uncomplicated opioid dependence (HCC) 04/25/2022   Gastric bypass status for obesity 12/18/2021   Erectile dysfunction due to arterial insufficiency 12/14/2021   Encounter for general adult medical examination with abnormal findings 12/12/2021   Class 1 obesity due to excess calories with serious  comorbidity and body mass index (BMI) of 32.0 to 32.9 in adult 06/15/2021   Gastroesophageal reflux disease without esophagitis 04/19/2018   Thiamine  deficiency 04/14/2018   Other cirrhosis of liver (HCC) 03/13/2018   NASH (nonalcoholic steatohepatitis) 03/13/2018   Low libido 01/28/2018   Hyperlipidemia LDL goal <130 10/25/2017   Insomnia secondary to chronic pain 06/19/2017   Osteoarthritis of left knee 04/26/2017   Degenerative spondylolisthesis 02/24/2016   Cough variant asthma 02/06/2015   Zinc  deficiency 04/12/2014   B12 deficiency anemia 04/08/2014   Vitamin D  deficiency 07/26/2012   Anemia, iron deficiency 07/25/2012   Osteoarthritis of right knee 03/12/2007   Essential hypertension 10/08/2006   BACK PAIN, LUMBAR 10/08/2006    Past Surgical History:  Procedure Laterality Date   ANKLE SURGERY Left 2003   ANTERIOR LAT LUMBAR FUSION Left 02/24/2016   Procedure: LEFT LUMBAR TWO-THREE  ANTERIOR LATERAL LUMBAR FUSION  ;  Surgeon: Victory Gunnels, MD;  Location: [facility];  Service: Neurosurgery;  Laterality: Left;   ANTERIOR LATERAL LUMBAR FUSION WITH PERCUTANEOUS SCREW 1 LEVEL Right 01/13/2020   Procedure: RIGHT LUMBAR ONE-TWO ANTERIOR LATERAL LUMBAR FUSION WITH PERCUTANEOUS SCREW PLACEMENT;  Surgeon: Gunnels Victory, MD;  Location: [facility] OR;  Service: Neurosurgery;  Laterality: Right;  3C   CHOLECYSTECTOMY N/A 01/16/2022   Procedure: LAPAROSCOPIC CHOLECYSTECTOMY WITH INTRAOPERATIVE CHOLANGIOGRAM AND ICG DYE;  Surgeon: Tanda Locus, MD;  Location: [facility] ORS;  Service: General;  Laterality: N/A;   GASTRIC BYPASS  2008   HERNIA REPAIR Left 2005   groin   INCISION AND DRAINAGE ABSCESS Right 07/06/2004   great toe   IRRIGATION AND DEBRIDEMENT ABSCESS Right 07/08/2004   great toe   KNEE ARTHROPLASTY Right 04/26/2017   Procedure: RIGHT TOTAL KNEE ARTHROPLASTY WITH COMPUTER NAVIGATION;  Surgeon: Fidel Rogue, MD;  Location: [facility] ORS;  Service: Orthopedics;  Laterality: Right;  Needs RNFA   KNEE  SURGERY Right 2004   LUMBAR PERCUTANEOUS PEDICLE SCREW 1 LEVEL Left 02/24/2016   Procedure: LUMBAR PERCUTANEOUS PEDICLE SCREW 1 LEVEL;  Surgeon: Victory Gunnels, MD;  Location: [facility];  Service: Neurosurgery;  Laterality: Left;   MICRODISCECTOMY LUMBAR Left 08/24/2008   T12 - L1   ORIF HUMERUS FRACTURE Right 04/17/2022   Procedure: OPEN REDUCTION INTERNAL FIXATION (ORIF) RIGHT DISTAL HUMERUS FRACTURE;  Surgeon: Cristy Bonner DASEN, MD;  Location: [facility] OR;  Service: Orthopedics;  Laterality: Right;   panectomy  2010   TOTAL KNEE ARTHROPLASTY Right 04/2017       Home Medications    Prior to Admission medications   Medication Sig Start Date End Date Taking? Authorizing Provider  chlorhexidine  (PERIDEX ) 0.12 % solution Use as directed 15 mLs in the mouth or throat 2 (two) times daily as directed. 12/25/23  Yes Jovahn Breit A, FNP  fluconazole (DIFLUCAN) 150 MG tablet Take 1 tablet (150 mg total) by mouth daily for 7 days. 12/25/23 01/01/24 Yes Tamiah Dysart A, FNP  acetaminophen  (TYLENOL ) 325 MG tablet Take 2 tablets (650 mg total) by mouth 4 (four) times daily. 05/01/22   Love, Sharlet RAMAN, PA-C  albuterol  (VENTOLIN  HFA) 108 (90 Base) MCG/ACT inhaler Inhale 2 puffs into the lungs every 6 (six) hours as needed for wheezing or shortness of breath. 07/10/23   Norleen Lynwood ORN, MD  amLODipine  (NORVASC ) 5 MG tablet Take 1 tablet (5 mg total) by mouth daily. 11/01/23   Joshua Debby CROME, MD  Baclofen  5 MG TABS Take 1 tablet (5 mg total) by mouth 3 (three) times daily. 05/01/22   Love, Sharlet RAMAN, PA-C  cyanocobalamin  (VITAMIN B12) 1000 MCG/ML injection Inject 1 mL (1,000 mcg total) into the muscle every 30 (thirty) days. 08/13/23   Joshua Debby CROME, MD  DULoxetine  (CYMBALTA ) 20 MG capsule Take 20 mg by mouth 2 (two) times daily.    [provider]  hydrOXYzine  (ATARAX ) 25 MG tablet Take 1 tablet (25 mg total) by mouth every 8 (eight) hours as needed for itching or anxiety. 11/07/23   Adah Corning A, FNP  indapamide  (LOZOL ) 1.25  MG tablet Take 1 tablet (1.25 mg total) by mouth daily. 10/29/23   Joshua Debby CROME, MD  irbesartan  (AVAPRO ) 150 MG tablet Take 1 tablet (150 mg total) by mouth daily. 11/22/23   Joshua Debby CROME, MD  magnesium  hydroxide (MILK OF MAGNESIA) 400 MG/5ML suspension Take 30 mLs by mouth daily. Patient taking differently: Take 30 mLs by mouth daily as needed. 05/01/22   Love, Sharlet RAMAN, PA-C  morphine  (MS CONTIN ) 30 MG 12 hr tablet Take 1 tablet (30 mg total) by mouth every 12 (twelve) hours. 05/01/22   Love, Sharlet RAMAN, PA-C  Multiple Vitamin (MULTIVITAMIN WITH MINERALS) TABS tablet Take 1 tablet by mouth daily. 05/02/22   Love, Sharlet RAMAN, PA-C  nystatin  (MYCOSTATIN ) 100000 UNIT/ML suspension SHAKE LIQUID AND TAKE 5 ML BY MOUTH FOUR TIMES DAILY 12/24/23   Joshua Debby CROME, MD  Oxycodone  HCl 10  MG TABS Take 10 mg by mouth. 06/26/22   [provider]  phentermine  (ADIPEX-P ) 37.5 MG tablet Take 1 tablet (37.5 mg total) by mouth daily before breakfast. 12/15/23   Joshua Debby CROME, MD  thiamine  (VITAMIN B-1) 50 MG tablet Take 1 tablet (50 mg total) by mouth daily. 11/01/23   Joshua Debby CROME, MD  ZEPBOUND  2.5 MG/0.5ML injection vial INJECT 0.5 ML (2.5 MG) UNDER THE SKIN ONCE WEEKLY (0.5ML= 50 UNITS) Patient not taking: Reported on 11/20/2023 09/26/23   Joshua Debby CROME, MD  zinc  gluconate 50 MG tablet Take 1 tablet (50 mg total) by mouth daily. 11/01/23   Joshua Debby CROME, MD    Family History Family History  Problem Relation Age of Onset   Arthritis Mother    Colon cancer Mother    Breast cancer Mother        twice   Kidney failure Mother    Heart disease Father    Hypertension Father    Esophageal cancer Neg Hx    Inflammatory bowel disease Neg Hx    Liver disease Neg Hx    Pancreatic cancer Neg Hx    Stomach cancer Neg Hx    Rectal cancer Neg Hx     Social History Social History   Tobacco Use   Smoking status: Never   Smokeless tobacco: Never  Vaping Use   Vaping status: Never Used  Substance  Use Topics   Alcohol  use: No   Drug use: No     Allergies   Codeine, Oxycodone -acetaminophen , Olmesartan , Rosuvastatin , and Nsaids   Review of Systems Review of Systems  See HPI Physical Exam Triage Vital Signs ED Triage Vitals  Encounter Vitals Group     BP 12/25/23 1226 (!) 140/89     Girls Systolic BP Percentile --      Girls Diastolic BP Percentile --      Boys Systolic BP Percentile --      Boys Diastolic BP Percentile --      Pulse Rate 12/25/23 1226 72     Resp 12/25/23 1226 20     Temp 12/25/23 1226 98.1 F (36.7 C)     Temp Source 12/25/23 1226 Oral     SpO2 12/25/23 1226 98 %     Weight --      Height --      Head Circumference --      Peak Flow --      Pain Score 12/25/23 1227 8     Pain Loc --      Pain Education --      Exclude from Growth Chart --    No data found.  Updated Vital Signs BP (!) 140/89 (BP Location: Right Arm)   Pulse 72   Temp 98.1 F (36.7 C) (Oral)   Resp 20   SpO2 98%   Visual Acuity Right Eye Distance:   Left Eye Distance:   Bilateral Distance:    Right Eye Near:   Left Eye Near:    Bilateral Near:     Physical Exam Constitutional:      Appearance: Normal appearance.  HENT:     Mouth/Throat:     Pharynx: Oropharynx is clear.     Comments: Mild yellow coating to various parts of tongue. Dentures in place.  No lesions.  Pulmonary:     Effort: Pulmonary effort is normal.  Musculoskeletal:        General: Normal range of motion.  Neurological:  Mental Status: He is alert.  Psychiatric:        Mood and Affect: Mood normal.      UC Treatments / Results  Labs (all labs ordered are listed, but only abnormal results are displayed) Labs Reviewed - No data to display  EKG   Radiology No results found.  Procedures Procedures (including critical care time)  Medications Ordered in UC Medications - No data to display  Initial Impression / Assessment and Plan / UC Course  I have reviewed the triage  vital signs and the nursing notes.  Pertinent labs & imaging results that were available during my care of the patient were reviewed by me and considered in my medical decision making (see chart for details).     Burning sensation of mouth- There can be many causes for this problem.  This could be due to your dentures and poor fitting dentures.  It also could be due to side effects of medications to include blood pressure medication or dry mouth.  Some things that can irritate the mouth are spicy foods, alcohol  based mouthwashes and tobacco.  We can try to treat for bacterial infection that could also be the cause.  Low B12 can cause this and acid reflux. We will try some antibacterial mouthwash and diflucan oral tabs for yeast. If this does not resolve you may want to see your denture specialist or PCP and speak about potential reactions from blood pressure medication. You may need some blood work.  Final Clinical Impressions(s) / UC Diagnoses   Final diagnoses:  Burning sensation of mouth     Discharge Instructions      There can be many causes for this problem.  This could be due to your dentures and poor fitting dentures.  It also could be due to side effects of medications to include blood pressure medication or dry mouth.  Some things that can irritate the mouth are spicy foods, alcohol  based mouthwashes and tobacco.  We can try to treat for bacterial infection that could also be the cause.  Low B12 can cause this and acid reflux. We will try some antibacterial mouthwash and diflucan oral tabs for yeast. If this does not resolve you may want to see your denture specialist or PCP and speak about potential reactions from blood pressure medication. You may need some blood work.     ED Prescriptions     Medication Sig Dispense Auth. Provider   chlorhexidine  (PERIDEX ) 0.12 % solution Use as directed 15 mLs in the mouth or throat 2 (two) times daily as directed. 473 mL Gwendola Hornaday A, FNP    fluconazole (DIFLUCAN) 150 MG tablet Take 1 tablet (150 mg total) by mouth daily for 7 days. 7 tablet Adah Wilbert LABOR, FNP      PDMP not reviewed this encounter.   Adah Wilbert LABOR, FNP 12/27/23 1055

## 2023-12-25 NOTE — ED Triage Notes (Signed)
 Patient recently treated for thrush. States return of discomfort to surface of tongue and inside of lips. Was approved to have a refill of his nystatin  that was sent yesterday. Patient states wanted someone to see him before starting the nystatin . Feel like constant film in mouth.

## 2023-12-26 ENCOUNTER — Encounter

## 2023-12-28 ENCOUNTER — Ambulatory Visit (INDEPENDENT_AMBULATORY_CARE_PROVIDER_SITE_OTHER)

## 2023-12-28 VITALS — Ht 64.0 in | Wt 210.0 lb

## 2023-12-28 DIAGNOSIS — Z0000 Encounter for general adult medical examination without abnormal findings: Secondary | ICD-10-CM | POA: Diagnosis not present

## 2023-12-28 NOTE — Progress Notes (Signed)
 Subjective:  Please attest and cosign this visit due to patients primary care provider not being in the office at the time the visit was completed.  (Pt of Bradley [name])   Bradley [name] is a 53 y.o. who presents for a Medicare Wellness preventive visit.  As a reminder, Annual Wellness Visits don't include a physical exam, and some assessments may be limited, especially if this visit is performed virtually. We may recommend an in-person follow-up visit with your provider if needed.  Visit Complete: Virtual I connected with  Bradley [name] on 12/28/23 by a audio enabled telemedicine application and verified that I am speaking with the correct person using two identifiers.  Patient Location: Home  Provider Location: Office/Clinic  I discussed the limitations of evaluation and management by telemedicine. The patient expressed understanding and agreed to proceed.  Vital Signs: Because this visit was a virtual/telehealth visit, some criteria may be missing or patient reported. Any vitals not documented were not able to be obtained and vitals that have been documented are patient reported.  VideoDeclined- This patient declined Librarian, academic. Therefore the visit was completed with audio only.  Persons Participating in Visit: Patient.  AWV Questionnaire: No: Patient Medicare AWV questionnaire was not completed prior to this visit.  Cardiac Risk Factors include: advanced age (>47men, >20 women);dyslipidemia;hypertension;male gender;obesity (BMI >30kg/m2)     Objective:    Today's Vitals   12/28/23 0933  Weight: 210 lb (95.3 kg)  Height: 5' 4 (1.626 m)   Body mass index is 36.05 kg/m.     12/18/2022    9:18 AM 05/22/2022    5:21 PM 04/21/2022    2:16 PM 04/18/2022    8:00 PM 04/14/2022    9:08 PM 01/16/2022    9:26 AM 01/09/2022   10:05 AM  Advanced Directives  Does Patient Have a Medical Advance Directive? Yes Yes Yes Yes Yes Yes Yes  Type of  Estate agent of [place];Living will Healthcare Power of [place];Living will Healthcare Power of [place];Living will Healthcare Power of [place];Living will Living will;Healthcare Power of [employer] Power of [place];Living will Healthcare Power of [place];Living will  Does patient want to make changes to medical advance directive?   No - Patient declined No - Guardian declined  No - Patient declined   Copy of Healthcare Power of Attorney in Chart? No - copy requested Yes - validated most recent copy scanned in chart (See row information) Yes - validated most recent copy scanned in chart (See row information) Yes - validated most recent copy scanned in chart (See row information) No - copy available, Physician notified No - copy requested     Current Medications (verified) Outpatient Encounter Medications as of 12/28/2023  Medication Sig   acetaminophen  (TYLENOL ) 325 MG tablet Take 2 tablets (650 mg total) by mouth 4 (four) times daily.   albuterol  (VENTOLIN  HFA) 108 (90 Base) MCG/ACT inhaler Inhale 2 puffs into the lungs every 6 (six) hours as needed for wheezing or shortness of breath.   amLODipine  (NORVASC ) 5 MG tablet Take 1 tablet (5 mg total) by mouth daily.   Baclofen  5 MG TABS Take 1 tablet (5 mg total) by mouth 3 (three) times daily.   chlorhexidine  (PERIDEX ) 0.12 % solution Use as directed 15 mLs in the mouth or throat 2 (two) times daily as directed.   cyanocobalamin  (VITAMIN B12) 1000 MCG/ML injection Inject 1 mL (1,000 mcg total) into the muscle every 30 (thirty) days.  DULoxetine  (CYMBALTA ) 20 MG capsule Take 20 mg by mouth 2 (two) times daily.   fluconazole (DIFLUCAN) 150 MG tablet Take 1 tablet (150 mg total) by mouth daily for 7 days.   hydrOXYzine  (ATARAX ) 25 MG tablet Take 1 tablet (25 mg total) by mouth every 8 (eight) hours as needed for itching or anxiety.   indapamide  (LOZOL ) 1.25 MG tablet Take 1 tablet (1.25 mg total) by mouth daily.    irbesartan  (AVAPRO ) 150 MG tablet Take 1 tablet (150 mg total) by mouth daily.   magnesium  hydroxide (MILK OF MAGNESIA) 400 MG/5ML suspension Take 30 mLs by mouth daily.   morphine  (MS CONTIN ) 30 MG 12 hr tablet Take 1 tablet (30 mg total) by mouth every 12 (twelve) hours.   Multiple Vitamin (MULTIVITAMIN WITH MINERALS) TABS tablet Take 1 tablet by mouth daily.   nystatin  (MYCOSTATIN ) 100000 UNIT/ML suspension SHAKE LIQUID AND TAKE 5 ML BY MOUTH FOUR TIMES DAILY   Oxycodone  HCl 10 MG TABS Take 10 mg by mouth.   phentermine  (ADIPEX-P ) 37.5 MG tablet Take 1 tablet (37.5 mg total) by mouth daily before breakfast.   thiamine  (VITAMIN B-1) 50 MG tablet Take 1 tablet (50 mg total) by mouth daily.   zinc  gluconate 50 MG tablet Take 1 tablet (50 mg total) by mouth daily.   ZEPBOUND  2.5 MG/0.5ML injection vial INJECT 0.5 ML (2.5 MG) UNDER THE SKIN ONCE WEEKLY (0.5ML= 50 UNITS) (Patient not taking: Reported on 12/28/2023)   No facility-administered encounter medications on file as of 12/28/2023.    Allergies (verified) Codeine, Oxycodone -acetaminophen , Olmesartan , Rosuvastatin , and Nsaids   History: Past Medical History:  Diagnosis Date   Allergy    Anxiety    Aortic valve sclerosis    Arthritis    Arthropathy, unspecified, other specified sites    CAD (coronary artery disease)    01/09/20: patient denied; faint coronary calcifications on 2016 CT   Depression    GERD (gastroesophageal reflux disease)    Heart murmur    mild MR, mild AS 06/2019 (Dr. Vina Gull)   History of kidney stones    Hypertension      no longer have hypertension since gastric bypass    Obesity, unspecified    Other, mixed, or unspecified nondependent drug abuse, unspecified    Personal history of urinary calculi    Priapism    Past Surgical History:  Procedure Laterality Date   ANKLE SURGERY Left 2003   ANTERIOR LAT LUMBAR FUSION Left 02/24/2016   Procedure: LEFT LUMBAR TWO-THREE  ANTERIOR LATERAL LUMBAR FUSION   ;  Surgeon: Victory Gunnels, MD;  Location: [facility] OR;  Service: Neurosurgery;  Laterality: Left;   ANTERIOR LATERAL LUMBAR FUSION WITH PERCUTANEOUS SCREW 1 LEVEL Right 01/13/2020   Procedure: RIGHT LUMBAR ONE-TWO ANTERIOR LATERAL LUMBAR FUSION WITH PERCUTANEOUS SCREW PLACEMENT;  Surgeon: Gunnels Victory, MD;  Location: [facility] OR;  Service: Neurosurgery;  Laterality: Right;  3C   CHOLECYSTECTOMY N/A 01/16/2022   Procedure: LAPAROSCOPIC CHOLECYSTECTOMY WITH INTRAOPERATIVE CHOLANGIOGRAM AND ICG DYE;  Surgeon: Tanda Locus, MD;  Location: [facility] ORS;  Service: General;  Laterality: N/A;   GASTRIC BYPASS  2008   HERNIA REPAIR Left 2005   groin   INCISION AND DRAINAGE ABSCESS Right 07/06/2004   great toe   IRRIGATION AND DEBRIDEMENT ABSCESS Right 07/08/2004   great toe   KNEE ARTHROPLASTY Right 04/26/2017   Procedure: RIGHT TOTAL KNEE ARTHROPLASTY WITH COMPUTER NAVIGATION;  Surgeon: Fidel Rogue, MD;  Location: [facility] ORS;  Service: Orthopedics;  Laterality: Right;  Needs RNFA   KNEE SURGERY Right 2004   LUMBAR PERCUTANEOUS PEDICLE SCREW 1 LEVEL Left 02/24/2016   Procedure: LUMBAR PERCUTANEOUS PEDICLE SCREW 1 LEVEL;  Surgeon: Victory Gunnels, MD;  Location: [facility] OR;  Service: Neurosurgery;  Laterality: Left;   MICRODISCECTOMY LUMBAR Left 08/24/2008   T12 - L1   ORIF HUMERUS FRACTURE Right 04/17/2022   Procedure: OPEN REDUCTION INTERNAL FIXATION (ORIF) RIGHT DISTAL HUMERUS FRACTURE;  Surgeon: Cristy Bonner DASEN, MD;  Location: [facility] OR;  Service: Orthopedics;  Laterality: Right;   panectomy  2010   TOTAL KNEE ARTHROPLASTY Right 04/2017   Family History  Problem Relation Age of Onset   Arthritis Mother    Colon cancer Mother    Breast cancer Mother        twice   Kidney failure Mother    Heart disease Father    Hypertension Father    Esophageal cancer Neg Hx    Inflammatory bowel disease Neg Hx    Liver disease Neg Hx    Pancreatic cancer Neg Hx    Stomach cancer Neg Hx    Rectal cancer Neg Hx    Social History    Socioeconomic History   Marital status: Married    Spouse name: Zebedee   Number of children: 3   Years of education: high school   Highest education level: GED or equivalent  Occupational History   Occupation: disabled  Tobacco Use   Smoking status: Never   Smokeless tobacco: Never  Vaping Use   Vaping status: Never Used  Substance and Sexual Activity   Alcohol  use: No   Drug use: No   Sexual activity: Yes  Other Topics Concern   Not on file  Social History Narrative   Lives with wife.   Social Drivers of Corporate investment banker Strain: Low Risk  (12/28/2023)   Overall Financial Resource Strain (CARDIA)    Difficulty of Paying Living Expenses: Not hard at all  Food Insecurity: No Food Insecurity (12/28/2023)   Hunger Vital Sign    Worried About Running Out of Food in the Last Year: Never true    Ran Out of Food in the Last Year: Never true  Transportation Needs: No Transportation Needs (12/28/2023)   PRAPARE - Administrator, Civil Service (Medical): No    Lack of Transportation (Non-Medical): No  Physical Activity: Sufficiently Active (12/28/2023)   Exercise Vital Sign    Days of Exercise per Week: 5 days    Minutes of Exercise per Session: 60 min  Stress: No Stress Concern Present (12/28/2023)   [employer] of Occupational Health - Occupational Stress Questionnaire    Feeling of Stress: Not at all  Social Connections: Socially Integrated (12/28/2023)   Social Connection and Isolation Panel    Frequency of Communication with Friends and Family: More than three times a week    Frequency of Social Gatherings with Friends and Family: More than three times a week    Attends Religious Services: More than 4 times per year    Active Member of [employer] or Organizations: Yes    Attends Engineer, structural: More than 4 times per year    Marital Status: Married    Tobacco Counseling Counseling given: Yes    Clinical Intake:  Pre-visit  preparation completed: Yes  Pain : No/denies pain     BMI - recorded: 36.05 Nutritional Status: BMI > 30  Obese Nutritional Risks: None Diabetes: No  Lab Results  Component Value  Date   HGBA1C 5.6 06/15/2021   HGBA1C 5.7 10/25/2017   HGBA1C 4.9 10/05/2009     How often do you need to have someone help you when you read instructions, pamphlets, or other written materials from your doctor or pharmacy?: 3 - Sometimes (Spouse assists)  Interpreter Needed?: No  Information entered by :: Verdie Saba, CMA   Activities of Daily Living     12/28/2023    9:38 AM  In your present state of health, do you have any difficulty performing the following activities:  Hearing? 0  Vision? 0  Difficulty concentrating or making decisions? 0  Walking or climbing stairs? 0  Dressing or bathing? 0  Doing errands, shopping? 0  Preparing Food and eating ? N  Using the Toilet? N  In the past six months, have you accidently leaked urine? N  Do you have problems with loss of bowel control? N  Managing your Medications? N  Managing your Finances? N  Housekeeping or managing your Housekeeping? N    Patient Care Team: Joshua Bradley CROME, MD as PCP - General (Internal Medicine) Okey Vina GAILS, MD as PCP - Cardiology (Cardiology) Merceda Lela SAUNDERS, [facility] (Pharmacist)  I have updated your Care Teams any recent Medical Services you may have received from other providers in the past year.     Assessment:   This is a routine wellness examination for Bradley [name].  Hearing/Vision screen Hearing Screening - Comments:: Denies hearing difficulties   Vision Screening - Comments:: Wears eyeglasses for reading    Goals Addressed               This Visit's Progress     Patient Stated (pt-stated)        Patient stated he plans to continue watching diet and exercising       Depression Screen     12/28/2023    9:40 AM 10/29/2023   11:20 AM 08/13/2023    2:16 PM 08/13/2023    2:12 PM 07/10/2023    2:20  PM 12/18/2022    9:22 AM 05/11/2022    1:12 PM  PHQ 2/9 Scores  PHQ - 2 Score 0 1 1 0 0 2 4  PHQ- 9 Score 1 5 6   7 11     Fall Risk     12/28/2023    9:38 AM 10/29/2023   11:19 AM 08/13/2023    2:16 PM 07/10/2023    2:27 PM 12/18/2022    9:18 AM  Fall Risk   Falls in the past year? 1 1 1  0 0  Number falls in past yr: 1 1 1  0 0  Comment 2      Injury with Fall? 0 1 1 0 0  Risk for fall due to : Impaired balance/gait Other (Comment) History of fall(s) No Fall Risks No Fall Risks  Follow up Falls evaluation completed;Falls prevention discussed Falls evaluation completed Falls evaluation completed Falls evaluation completed Falls prevention discussed;Falls evaluation completed    MEDICARE RISK AT HOME:  Medicare Risk at Home Any stairs in or around the home?: Yes If so, are there any without handrails?: No Home free of loose throw rugs in walkways, pet beds, electrical cords, etc?: Yes Adequate lighting in your home to reduce risk of falls?: Yes Life alert?: No Use of a cane, walker or w/c?: Yes (cane) Grab bars in the bathroom?: No Shower chair or bench in shower?: Yes Elevated toilet seat or a handicapped toilet?: No  TIMED UP AND  GO:  Was the test performed?  No  Cognitive Function: 6CIT completed        12/28/2023    9:42 AM 12/18/2022    9:19 AM 12/01/2021   12:46 PM  6CIT Screen  What Year? 0 points 0 points 0 points  What month? 0 points 0 points   What time? 0 points 0 points 0 points  Count back from 20 0 points 0 points 0 points  Months in reverse 0 points 0 points 0 points  Repeat phrase 0 points 0 points 0 points  Total Score 0 points 0 points     Immunizations Immunization History  Administered Date(s) Administered   Hep A / Hep B 09/26/2016, 10/27/2016, 01/29/2017   Influenza,inj,Quad PF,6+ Mos 12/07/2017, 04/22/2019   Moderna Sars-Covid-2 Vaccination 10/26/2019, 11/29/2019   Pneumococcal Conjugate-13 07/25/2012   Tdap 08/28/2007, 09/05/2016     Screening Tests Health Maintenance  Topic Date Due   Zoster Vaccines- Shingrix  (1 of 2) Never done   Pneumococcal Vaccine: 50+ Years (2 of 2 - PPSV23, PCV20, or PCV21) 09/19/2012   COVID-19 Vaccine (3 - Moderna risk series) 12/27/2019   Influenza Vaccine  10/05/2023   Medicare Annual Wellness (AWV)  12/27/2024   DTaP/Tdap/Td (3 - Td or Tdap) 09/06/2026   Colonoscopy  05/03/2028   Hepatitis B Vaccines 19-59 Average Risk  Completed   Hepatitis C Screening  Completed   HIV Screening  Completed   HPV VACCINES  Aged Out   Meningococcal B Vaccine  Aged Out    Health Maintenance Items Addressed:  I have recommended that this patient have a immunization for Influenza, Shingles, and Pneumonia but he declines at this time. I have discussed the risks and benefits of this procedure with him. The patient verbalizes understanding.   Additional Screening:  Vision Screening: Recommended annual ophthalmology exams for early detection of glaucoma and other disorders of the eye. Is the patient up to date with their annual eye exam?  No    Dental Screening: Recommended annual dental exams for proper oral hygiene  Community Resource Referral / Chronic Care Management: CRR required this visit?  No   CCM required this visit?  No   Plan:    I have personally reviewed and noted the following in the patient's chart:   Medical and social history Use of alcohol , tobacco or illicit drugs  Current medications and supplements including opioid prescriptions. Patient is currently taking opioid prescriptions. Information provided to patient regarding non-opioid alternatives. Patient advised to discuss non-opioid treatment plan with their provider. Functional ability and status Nutritional status Physical activity Advanced directives List of other physicians Hospitalizations, surgeries, and ER visits in previous 12 months Vitals Screenings to include cognitive, depression, and falls Referrals  and appointments  In addition, I have reviewed and discussed with patient certain preventive protocols, quality metrics, and best practice recommendations. A written personalized care plan for preventive services as well as general preventive health recommendations were provided to patient.   Verdie CHRISTELLA Saba, CMA   12/28/2023   After Visit Summary: (MyChart) Due to this being a telephonic visit, the after visit summary with patients personalized plan was offered to patient via MyChart   Notes: Scheduled a Physical w/PCP for 01/2024.

## 2023-12-28 NOTE — Patient Instructions (Addendum)
 Mr. Pulido,  Thank you for taking the time for your Medicare Wellness Visit. I appreciate your continued commitment to your health goals. Please review the care plan we discussed, and feel free to reach out if I can assist you further.  Medicare recommends these wellness visits once per year to help you and your care team stay ahead of potential health issues. These visits are designed to focus on prevention, allowing your provider to concentrate on managing your acute and chronic conditions during your regular appointments.  Please note that Annual Wellness Visits do not include a physical exam. Some assessments may be limited, especially if the visit was conducted virtually. If needed, we may recommend a separate in-person follow-up with your provider.  Ongoing Care Seeing your primary care provider every 3 to 6 months helps us  monitor your health and provide consistent, personalized care.   Referrals If a referral was made during today's visit and you haven't received any updates within two weeks, please contact the referred provider directly to check on the status.  Recommended Screenings:  Health Maintenance  Topic Date Due   Zoster (Shingles) Vaccine (1 of 2) Never done   Pneumococcal Vaccine for age over 37 (2 of 2 - PPSV23, PCV20, or PCV21) 09/19/2012   COVID-19 Vaccine (3 - Moderna risk series) 12/27/2019   Flu Shot  10/05/2023   Medicare Annual Wellness Visit  12/27/2024   DTaP/Tdap/Td vaccine (3 - Td or Tdap) 09/06/2026   Colon Cancer Screening  05/03/2028   Hepatitis B Vaccine  Completed   Hepatitis C Screening  Completed   HIV Screening  Completed   HPV Vaccine  Aged Out   Meningitis B Vaccine  Aged Out       12/18/2022    9:18 AM  Advanced Directives  Does Patient Have a Medical Advance Directive? Yes  Type of Estate agent of [place];Living will  Copy of Healthcare Power of Attorney in Chart? No - copy requested   Advance Care Planning is  important because it: Ensures you receive medical care that aligns with your values, goals, and preferences. Provides guidance to your family and loved ones, reducing the emotional burden of decision-making during critical moments.  Vision: Annual vision screenings are recommended for early detection of glaucoma, cataracts, and diabetic retinopathy. These exams can also reveal signs of chronic conditions such as diabetes and high blood pressure.  Dental: Annual dental screenings help detect early signs of oral cancer, gum disease, and other conditions linked to overall health, including heart disease and diabetes.

## 2024-01-27 ENCOUNTER — Other Ambulatory Visit: Payer: Self-pay | Admitting: Internal Medicine

## 2024-01-27 DIAGNOSIS — I10 Essential (primary) hypertension: Secondary | ICD-10-CM

## 2024-01-28 ENCOUNTER — Other Ambulatory Visit: Payer: Self-pay | Admitting: Internal Medicine

## 2024-01-28 DIAGNOSIS — I10 Essential (primary) hypertension: Secondary | ICD-10-CM

## 2024-01-30 ENCOUNTER — Ambulatory Visit: Admitting: Internal Medicine

## 2024-01-30 ENCOUNTER — Ambulatory Visit

## 2024-01-30 ENCOUNTER — Encounter: Payer: Self-pay | Admitting: Internal Medicine

## 2024-01-30 VITALS — BP 120/76 | HR 84 | Temp 97.6°F | Resp 16 | Ht 64.0 in | Wt 204.4 lb

## 2024-01-30 DIAGNOSIS — E60 Dietary zinc deficiency: Secondary | ICD-10-CM | POA: Diagnosis not present

## 2024-01-30 DIAGNOSIS — I2510 Atherosclerotic heart disease of native coronary artery without angina pectoris: Secondary | ICD-10-CM | POA: Diagnosis not present

## 2024-01-30 DIAGNOSIS — Z0000 Encounter for general adult medical examination without abnormal findings: Secondary | ICD-10-CM

## 2024-01-30 DIAGNOSIS — I10 Essential (primary) hypertension: Secondary | ICD-10-CM | POA: Diagnosis not present

## 2024-01-30 DIAGNOSIS — D510 Vitamin B12 deficiency anemia due to intrinsic factor deficiency: Secondary | ICD-10-CM

## 2024-01-30 DIAGNOSIS — Z0001 Encounter for general adult medical examination with abnormal findings: Secondary | ICD-10-CM

## 2024-01-30 DIAGNOSIS — R051 Acute cough: Secondary | ICD-10-CM

## 2024-01-30 DIAGNOSIS — J22 Unspecified acute lower respiratory infection: Secondary | ICD-10-CM

## 2024-01-30 DIAGNOSIS — K7581 Nonalcoholic steatohepatitis (NASH): Secondary | ICD-10-CM

## 2024-01-30 DIAGNOSIS — D508 Other iron deficiency anemias: Secondary | ICD-10-CM

## 2024-01-30 DIAGNOSIS — R6882 Decreased libido: Secondary | ICD-10-CM

## 2024-01-30 DIAGNOSIS — E559 Vitamin D deficiency, unspecified: Secondary | ICD-10-CM | POA: Diagnosis not present

## 2024-01-30 DIAGNOSIS — Z23 Encounter for immunization: Secondary | ICD-10-CM

## 2024-01-30 DIAGNOSIS — E785 Hyperlipidemia, unspecified: Secondary | ICD-10-CM | POA: Diagnosis not present

## 2024-01-30 DIAGNOSIS — E519 Thiamine deficiency, unspecified: Secondary | ICD-10-CM

## 2024-01-30 LAB — CBC WITH DIFFERENTIAL/PLATELET
Basophils Absolute: 0 K/uL (ref 0.0–0.1)
Basophils Relative: 0.3 % (ref 0.0–3.0)
Eosinophils Absolute: 0.1 K/uL (ref 0.0–0.7)
Eosinophils Relative: 0.7 % (ref 0.0–5.0)
HCT: 37.9 % — ABNORMAL LOW (ref 39.0–52.0)
Hemoglobin: 12.2 g/dL — ABNORMAL LOW (ref 13.0–17.0)
Lymphocytes Relative: 36.8 % (ref 12.0–46.0)
Lymphs Abs: 2.6 K/uL (ref 0.7–4.0)
MCHC: 32.2 g/dL (ref 30.0–36.0)
MCV: 79.9 fl (ref 78.0–100.0)
Monocytes Absolute: 0.8 K/uL (ref 0.1–1.0)
Monocytes Relative: 11.7 % (ref 3.0–12.0)
Neutro Abs: 3.6 K/uL (ref 1.4–7.7)
Neutrophils Relative %: 50.5 % (ref 43.0–77.0)
Platelets: 311 K/uL (ref 150.0–400.0)
RBC: 4.74 Mil/uL (ref 4.22–5.81)
RDW: 16.1 % — ABNORMAL HIGH (ref 11.5–15.5)
WBC: 7.2 K/uL (ref 4.0–10.5)

## 2024-01-30 LAB — IBC + FERRITIN
Ferritin: 55.9 ng/mL (ref 22.0–322.0)
Iron: 49 ug/dL (ref 42–165)
Saturation Ratios: 12 % — ABNORMAL LOW (ref 20.0–50.0)
TIBC: 407.4 ug/dL (ref 250.0–450.0)
Transferrin: 291 mg/dL (ref 212.0–360.0)

## 2024-01-30 LAB — PROTIME-INR
INR: 1 ratio (ref 0.8–1.0)
Prothrombin Time: 10.5 s (ref 9.6–13.1)

## 2024-01-30 LAB — LIPID PANEL
Cholesterol: 132 mg/dL (ref 0–200)
HDL: 45.3 mg/dL (ref >39.00)
LDL Cholesterol: 70 mg/dL (ref 0–99)
NonHDL: 86.7
Total CHOL/HDL Ratio: 3
Triglycerides: 86 mg/dL (ref 0.0–149.0)
VLDL: 17.2 mg/dL (ref 0.0–40.0)

## 2024-01-30 LAB — POCT INFLUENZA A/B
Influenza A, POC: NEGATIVE
Influenza B, POC: NEGATIVE

## 2024-01-30 LAB — HEPATIC FUNCTION PANEL
ALT: 24 U/L (ref 0–53)
AST: 30 U/L (ref 0–37)
Albumin: 4.1 g/dL (ref 3.5–5.2)
Alkaline Phosphatase: 74 U/L (ref 39–117)
Bilirubin, Direct: 0.1 mg/dL (ref 0.0–0.3)
Total Bilirubin: 0.5 mg/dL (ref 0.2–1.2)
Total Protein: 7.2 g/dL (ref 6.0–8.3)

## 2024-01-30 LAB — POC COVID19 BINAXNOW: SARS Coronavirus 2 Ag: NEGATIVE

## 2024-01-30 LAB — VITAMIN D 25 HYDROXY (VIT D DEFICIENCY, FRACTURES): VITD: 35.5 ng/mL (ref 30.00–100.00)

## 2024-01-30 MED ORDER — SHINGRIX 50 MCG/0.5ML IM SUSR: 0.5000 mL | Freq: Once | INTRAMUSCULAR | 1 refills | Status: AC

## 2024-01-30 NOTE — Progress Notes (Signed)
 Subjective:  Patient ID: Bradley [name], male    DOB: January 03, 1971  Age: 53 y.o. MRN: [id number]  CC: Annual Exam, Hypertension, Hyperlipidemia, Anemia, and Cough   HPI Bradley [name] presents for a CPX and f/up  --  Discussed the use of AI scribe software for clinical note transcription with the patient, who gave verbal consent to proceed.  History of Present Illness Bradley [name] is a 53 year old male who presents for follow-up after left knee surgery and management of hypertension.  He underwent left knee surgery approximately one and a half weeks ago. He is upset because a ganglion cyst, which was one of the main reasons for the surgery, was not removed. No weakness, dizziness, or lightheadedness post-surgery.  He has a history of hypertension and is currently taking irbesartan , amlodipine , and indapamide . He had previously stopped his blood pressure medications due to concerns about side effects, including thrush, but resumed them after experiencing severe headaches and elevated blood pressure. No current symptoms of low or high blood pressure, such as headaches, chest pain, or shortness of breath.  He has been experiencing congestion and has been coughing up thick, yellow-green sputum for the past two days. He ran a fever for a couple of days post-surgery and managed it with Tylenol  and Sudafed. No chest pain but notes some shortness of breath.  He does not receive flu, COVID, pneumonia, or shingles vaccines.   Outpatient Medications Prior to Visit  Medication Sig Dispense Refill   acetaminophen  (TYLENOL ) 325 MG tablet Take 2 tablets (650 mg total) by mouth 4 (four) times daily.     albuterol  (VENTOLIN  HFA) 108 (90 Base) MCG/ACT inhaler Inhale 2 puffs into the lungs every 6 (six) hours as needed for wheezing or shortness of breath. 8 g 1   amLODipine  (NORVASC ) 5 MG tablet TAKE 1 TABLET(5 MG) BY MOUTH DAILY 90 tablet 0   Baclofen  5 MG TABS Take 1 tablet (5 mg total) by  mouth 3 (three) times daily. 90 tablet 0   chlorhexidine  (PERIDEX ) 0.12 % solution Use as directed 15 mLs in the mouth or throat 2 (two) times daily as directed. 473 mL 0   cyanocobalamin  (VITAMIN B12) 1000 MCG/ML injection Inject 1 mL (1,000 mcg total) into the muscle every 30 (thirty) days. 3 mL 1   DILAUDID  4 MG tablet Take 1 tablet every 4-6 hours by oral route as needed, for severe pain.     DULoxetine  (CYMBALTA ) 20 MG capsule Take 20 mg by mouth 2 (two) times daily.     hydrOXYzine  (ATARAX ) 25 MG tablet Take 1 tablet (25 mg total) by mouth every 8 (eight) hours as needed for itching or anxiety. 12 tablet 0   indapamide  (LOZOL ) 1.25 MG tablet TAKE 1 TABLET(1.25 MG) BY MOUTH DAILY 90 tablet 0   irbesartan  (AVAPRO ) 150 MG tablet Take 1 tablet (150 mg total) by mouth daily. 90 tablet 0   magnesium  hydroxide (MILK OF MAGNESIA) 400 MG/5ML suspension Take 30 mLs by mouth daily. 355 mL 0   morphine  (MS CONTIN ) 30 MG 12 hr tablet Take 1 tablet (30 mg total) by mouth every 12 (twelve) hours. 14 tablet 0   Multiple Vitamin (MULTIVITAMIN WITH MINERALS) TABS tablet Take 1 tablet by mouth daily. 30 tablet 0   nystatin  (MYCOSTATIN ) 100000 UNIT/ML suspension SHAKE LIQUID AND TAKE 5 ML BY MOUTH FOUR TIMES DAILY 60 mL 3   ondansetron  (ZOFRAN -ODT) 4 MG disintegrating tablet  Oxycodone  HCl 10 MG TABS Take 10 mg by mouth.     phentermine  (ADIPEX-P ) 37.5 MG tablet Take 1 tablet (37.5 mg total) by mouth daily before breakfast. 30 tablet 1   thiamine  (VITAMIN B-1) 50 MG tablet Take 1 tablet (50 mg total) by mouth daily. 90 tablet 1   zinc  gluconate 50 MG tablet Take 1 tablet (50 mg total) by mouth daily. 90 tablet 0   ZEPBOUND  2.5 MG/0.5ML injection vial INJECT 0.5 ML (2.5 MG) UNDER THE SKIN ONCE WEEKLY (0.5ML= 50 UNITS) (Patient not taking: Reported on 12/28/2023) 2 mL 0   No facility-administered medications prior to visit.    ROS Review of Systems  Constitutional:  Negative for appetite change, chills,  diaphoresis, fatigue and fever.  HENT: Negative.  Negative for sore throat and trouble swallowing.   Eyes: Negative.   Respiratory:  Positive for cough. Negative for chest tightness, shortness of breath and wheezing.   Cardiovascular:  Negative for chest pain, palpitations and leg swelling.  Gastrointestinal: Negative.  Negative for abdominal pain, blood in stool, constipation, diarrhea, nausea and vomiting.  Endocrine: Negative.   Genitourinary:  Negative for difficulty urinating, dysuria and hematuria.  Musculoskeletal:  Positive for arthralgias, back pain and gait problem. Negative for myalgias and neck pain.  Skin: Negative.   Neurological:  Negative for dizziness, weakness, light-headedness and headaches.  Hematological:  Negative for adenopathy. Does not bruise/bleed easily.    Objective:  BP 120/76 (BP Location: Left Arm, Patient Position: Sitting, Cuff Size: Normal)   Pulse 84   Temp 97.6 F (36.4 C) (Oral)   Resp 16   Ht 5' 4 (1.626 m)   Wt 204 lb 6.4 oz (92.7 kg)   SpO2 94%   BMI 35.09 kg/m   BP Readings from Last 3 Encounters:  01/30/24 120/76  12/25/23 (!) 140/89  11/27/23 118/82    Wt Readings from Last 3 Encounters:  01/30/24 204 lb 6.4 oz (92.7 kg)  12/28/23 210 lb (95.3 kg)  10/29/23 230 lb (104.3 kg)    Physical Exam Vitals reviewed.  Constitutional:      General: He is not in acute distress.    Appearance: He is not toxic-appearing or diaphoretic.  HENT:     Nose: Nose normal.     Mouth/Throat:     Mouth: Mucous membranes are moist.  Eyes:     General: No scleral icterus.    Conjunctiva/sclera: Conjunctivae normal.  Cardiovascular:     Rate and Rhythm: Normal rate and regular rhythm.     Heart sounds: Murmur heard.     Systolic murmur is present with a grade of 1/6.     No friction rub. No gallop.     Comments: EKG--- NSR, 72 bpm No LVH, Q waves, or ST/T wave changes  Unchanged  Pulmonary:     Effort: Pulmonary effort is normal.      Breath sounds: No stridor. No wheezing, rhonchi or rales.  Abdominal:     General: Abdomen is flat.     Palpations: There is no mass.     Tenderness: There is no abdominal tenderness. There is no guarding.     Hernia: No hernia is present.  Musculoskeletal:        General: No swelling.     Cervical back: Neck supple.     Right lower leg: No edema.     Left lower leg: No edema.  Lymphadenopathy:     Cervical: No cervical adenopathy.  Skin:  General: Skin is warm and dry.     Findings: No rash.  Neurological:     General: No focal deficit present.     Mental Status: He is alert. Mental status is at baseline.  Psychiatric:        Mood and Affect: Mood normal.        Behavior: Behavior normal.     Lab Results  Component Value Date   WBC 7.2 01/30/2024   HGB 12.2 (L) 01/30/2024   HCT 37.9 (L) 01/30/2024   PLT 311.0 01/30/2024   GLUCOSE 83 12/10/2023   CHOL 132 01/30/2024   TRIG 86.0 01/30/2024   HDL 45.30 01/30/2024   LDLCALC 70 01/30/2024   ALT 24 01/30/2024   AST 30 01/30/2024   NA 137 12/10/2023   K 5.0 12/10/2023   CL 101 12/10/2023   CREATININE 1.10 12/10/2023   BUN 15 12/10/2023   CO2 29 12/10/2023   TSH 1.78 10/29/2023   PSA 0.88 10/29/2023   INR 1.0 01/30/2024   HGBA1C 5.6 06/15/2021     Fibrosis 4 Score = 1.08 (Low risk)        Interpretation for patients with NAFLD          <1.30       -  F0-F1 (Low risk)          1.30-2.67 -  Indeterminate           >2.67      -  F3-F4 (High risk)     Validated for ages 75-65       The 10-year ASCVD risk score (Arnett DK, et al., 2019) is: 4%   Values used to calculate the score:     Age: 20 years     Clincally relevant sex: Male     Is Non-Hispanic African American: No     Diabetic: No     Tobacco smoker: No     Systolic Blood Pressure: 120 mmHg     Is BP treated: Yes     HDL Cholesterol: 45.3 mg/dL     Total Cholesterol: 132 mg/dL   Assessment & Plan:  Other iron deficiency anemia -     IBC + Ferritin;  Future -     CBC with Differential/Platelet; Future  Vitamin B12 deficiency anemia due to intrinsic factor deficiency  Zinc  deficiency  Hyperlipidemia LDL goal <130- He does not tolerate statins. -     Lipid panel; Future  Thiamine  deficiency  Encounter for general adult medical examination with abnormal findings- Exam completed, labs reviewed, vaccines reviewed, cancer screenings are UTD, pt ed material was given.   Acute cough -     DG Chest 2 View; Future -     POCT Influenza A/B -     POC COVID-19 BinaxNow  NASH (nonalcoholic steatohepatitis) -     Hepatic function panel; Future -     Protime-INR; Future  Vitamin D  deficiency -     VITAMIN D  25 Hydroxy (Vit-D Deficiency, Fractures); Future  Essential hypertension- BP is well controlled. EKG is negative for LVH. -     EKG 12-Lead  Coronary artery disease, unspecified vessel or lesion type, unspecified whether angina present, unspecified whether native or transplanted heart -     EKG 12-Lead  Need for prophylactic vaccination and inoculation against varicella -     Shingrix ; Inject 0.5 mLs into the muscle once for 1 dose.  Dispense: 0.5 mL; Refill: 1  Low libido -  Testosterone  Total,Free,Bio, Males; Future -     Prolactin; Future  LRTI (lower respiratory tract infection) -     Cefdinir ; Take 1 capsule (300 mg total) by mouth 2 (two) times daily for 7 days.  Dispense: 14 capsule; Refill: 0     Follow-up: Return in about 3 months (around 05/01/2024).  Debby Molt, MD

## 2024-01-30 NOTE — Patient Instructions (Signed)
 Health Maintenance, Male  Adopting a healthy lifestyle and getting preventive care are important in promoting health and wellness. Ask your health care provider about:  The right schedule for you to have regular tests and exams.  Things you can do on your own to prevent diseases and keep yourself healthy.  What should I know about diet, weight, and exercise?  Eat a healthy diet    Eat a diet that includes plenty of vegetables, fruits, low-fat dairy products, and lean protein.  Do not eat a lot of foods that are high in solid fats, added sugars, or sodium.  Maintain a healthy weight  Body mass index (BMI) is a measurement that can be used to identify possible weight problems. It estimates body fat based on height and weight. Your health care provider can help determine your BMI and help you achieve or maintain a healthy weight.  Get regular exercise  Get regular exercise. This is one of the most important things you can do for your health. Most adults should:  Exercise for at least 150 minutes each week. The exercise should increase your heart rate and make you sweat (moderate-intensity exercise).  Do strengthening exercises at least twice a week. This is in addition to the moderate-intensity exercise.  Spend less time sitting. Even light physical activity can be beneficial.  Watch cholesterol and blood lipids  Have your blood tested for lipids and cholesterol at 55 years of age, then have this test every 5 years.  You may need to have your cholesterol levels checked more often if:  Your lipid or cholesterol levels are high.  You are older than 55 years of age.  You are at high risk for heart disease.  What should I know about cancer screening?  Many types of cancers can be detected early and may often be prevented. Depending on your health history and family history, you may need to have cancer screening at various ages. This may include screening for:  Colorectal cancer.  Prostate cancer.  Skin cancer.  Lung  cancer.  What should I know about heart disease, diabetes, and high blood pressure?  Blood pressure and heart disease  High blood pressure causes heart disease and increases the risk of stroke. This is more likely to develop in people who have high blood pressure readings or are overweight.  Talk with your health care provider about your target blood pressure readings.  Have your blood pressure checked:  Every 3-5 years if you are 24-52 years of age.  Every year if you are 3 years old or older.  If you are between the ages of 60 and 72 and are a current or former smoker, ask your health care provider if you should have a one-time screening for abdominal aortic aneurysm (AAA).  Diabetes  Have regular diabetes screenings. This checks your fasting blood sugar level. Have the screening done:  Once every three years after age 66 if you are at a normal weight and have a low risk for diabetes.  More often and at a younger age if you are overweight or have a high risk for diabetes.  What should I know about preventing infection?  Hepatitis B  If you have a higher risk for hepatitis B, you should be screened for this virus. Talk with your health care provider to find out if you are at risk for hepatitis B infection.  Hepatitis C  Blood testing is recommended for:  Everyone born from [birth year] through 1967.  Anyone  with known risk factors for hepatitis C.  Sexually transmitted infections (STIs)  You should be screened each year for STIs, including gonorrhea and chlamydia, if:  You are sexually active and are younger than 55 years of age.  You are older than 55 years of age and your health care provider tells you that you are at risk for this type of infection.  Your sexual activity has changed since you were last screened, and you are at increased risk for chlamydia or gonorrhea. Ask your health care provider if you are at risk.  Ask your health care provider about whether you are at high risk for HIV. Your health care provider  may recommend a prescription medicine to help prevent HIV infection. If you choose to take medicine to prevent HIV, you should first get tested for HIV. You should then be tested every 3 months for as long as you are taking the medicine.  Follow these instructions at home:  Alcohol use  Do not drink alcohol if your health care provider tells you not to drink.  If you drink alcohol:  Limit how much you have to 0-2 drinks a day.  Know how much alcohol is in your drink. In the [place] one drink equals one 12 oz bottle of beer (355 mL), one 5 oz glass of wine (148 mL), or one 1 oz glass of hard liquor (44 mL).  Lifestyle  Do not use any products that contain nicotine or tobacco. These products include cigarettes, chewing tobacco, and vaping devices, such as e-cigarettes. If you need help quitting, ask your health care provider.  Do not use street drugs.  Do not share needles.  Ask your health care provider for help if you need support or information about quitting drugs.  General instructions  Schedule regular health, dental, and eye exams.  Stay current with your vaccines.  Tell your health care provider if:  You often feel depressed.  You have ever been abused or do not feel safe at home.  Summary  Adopting a healthy lifestyle and getting preventive care are important in promoting health and wellness.  Follow your health care provider's instructions about healthy diet, exercising, and getting tested or screened for diseases.  Follow your health care provider's instructions on monitoring your cholesterol and blood pressure.  This information is not intended to replace advice given to you by your health care provider. Make sure you discuss any questions you have with your health care provider.  Document Revised: 07/12/2020 Document Reviewed: 07/12/2020  Elsevier Patient Education  2024 [employer].

## 2024-02-01 DIAGNOSIS — J22 Unspecified acute lower respiratory infection: Secondary | ICD-10-CM | POA: Insufficient documentation

## 2024-02-01 DIAGNOSIS — Z23 Encounter for immunization: Secondary | ICD-10-CM | POA: Insufficient documentation

## 2024-02-01 MED ORDER — CEFDINIR 300 MG PO CAPS: 300.0000 mg | ORAL_CAPSULE | Freq: Two times a day (BID) | ORAL | 0 refills | Status: AC

## 2024-02-02 ENCOUNTER — Ambulatory Visit: Payer: Self-pay | Admitting: Internal Medicine

## 2024-02-02 LAB — TESTOSTERONE TOTAL,FREE,BIO, MALES
Albumin: 4.1 g/dL (ref 3.6–5.1)
Sex Hormone Binding: 69 nmol/L — ABNORMAL HIGH (ref 10–50)
Testosterone, Bioavailable: 55.1 ng/dL — ABNORMAL LOW (ref 110.0–575.0)
Testosterone, Free: 29.3 pg/mL — ABNORMAL LOW (ref 46.0–224.0)
Testosterone: 422 ng/dL (ref 250–827)

## 2024-02-02 LAB — PROLACTIN: Prolactin: 14.8 ng/mL (ref 2.0–18.0)

## 2024-02-03 ENCOUNTER — Encounter: Payer: Self-pay | Admitting: Internal Medicine

## 2024-02-04 ENCOUNTER — Encounter: Payer: Self-pay | Admitting: Internal Medicine

## 2024-02-12 ENCOUNTER — Other Ambulatory Visit: Payer: Self-pay | Admitting: Internal Medicine

## 2024-02-12 DIAGNOSIS — E60 Dietary zinc deficiency: Secondary | ICD-10-CM

## 2024-02-16 ENCOUNTER — Other Ambulatory Visit: Payer: Self-pay | Admitting: Internal Medicine

## 2024-02-16 DIAGNOSIS — I10 Essential (primary) hypertension: Secondary | ICD-10-CM

## 2024-02-25 ENCOUNTER — Encounter: Payer: Self-pay | Admitting: Internal Medicine

## 2024-02-27 ENCOUNTER — Other Ambulatory Visit: Payer: Self-pay | Admitting: Internal Medicine

## 2024-02-27 DIAGNOSIS — E6609 Other obesity due to excess calories: Secondary | ICD-10-CM

## 2024-02-27 MED ORDER — PHENTERMINE HCL 37.5 MG PO TABS: 37.5000 mg | ORAL_TABLET | Freq: Every day | ORAL | 0 refills | Status: AC

## 2024-04-10 ENCOUNTER — Ambulatory Visit: Admitting: Nurse Practitioner

## 2024-04-10 VITALS — BP 118/74 | HR 70 | Temp 98.0°F | Ht 64.0 in | Wt 193.1 lb

## 2024-04-10 DIAGNOSIS — G4701 Insomnia due to medical condition: Secondary | ICD-10-CM

## 2024-04-10 DIAGNOSIS — R053 Chronic cough: Secondary | ICD-10-CM | POA: Insufficient documentation

## 2024-04-10 DIAGNOSIS — D171 Benign lipomatous neoplasm of skin and subcutaneous tissue of trunk: Secondary | ICD-10-CM | POA: Insufficient documentation

## 2024-04-10 MED ORDER — ALBUTEROL SULFATE HFA 108 (90 BASE) MCG/ACT IN AERS: 2.0000 | INHALATION_SPRAY | Freq: Four times a day (QID) | RESPIRATORY_TRACT | 1 refills | Status: AC | PRN

## 2024-04-10 MED ORDER — TRAZODONE HCL 50 MG PO TABS: 50.0000 mg | ORAL_TABLET | Freq: Every evening | ORAL | 0 refills | Status: AC | PRN

## 2024-04-10 NOTE — Assessment & Plan Note (Signed)
 Chronic cough, possible chronic obstructive pulmonary disease Chronic cough with sputum production, differential includes mild COPD or chronic bronchitis. No smoking history, positive secondhand smoke exposure. Mild inspiratory wheezing noted.  - Referred to pulmonologist for pulmonary function testing. - Prescribed albuterol  inhaler for wheezing. - Considered spiriva, but patient reports BPH with significant LUTS this hold off and defer to pulmonologist

## 2024-04-10 NOTE — Progress Notes (Signed)
 "  Established Patient Office Visit  Subjective   Patient ID: Bradley [name], male    DOB: 1970-08-04  Age: 54 y.o. MRN: [id number]  Chief Complaint  Patient presents with   Cough    Symptoms have been present since before Christmas. Patient reports coughing up phlegm, with symptoms worse at night and in the morning, but occurring throughout the day as well. Patient has been trying various home remedies without improvement   Mass    Patient reports a bump on the right mid-side that he first noticed approximately one month ago. States the area is intermittently painful. Denies any increase in size since first noticing the bump.    Discussed the use of AI scribe software for clinical note transcription with the patient, who gave verbal consent to proceed.  History of Present Illness HERBY AMICK is a 54 year old male with asthma who presents with a persistent cough and a new lump on his back.  Productive cough - Persistent productive cough for several months (01/2024). Trreated with course of antibiotics and initially got better. Chest Xray at that time showed low lung volumes, but otherwise no acute abnormalities.  - Phlegm present every morning and night - Symptoms improve during the day but worsen in the evenings and mornings - History of cough variant asthma, does not have access to albuterol  inhaler at this time - Significant exposure to second-hand smoke as a child. No personal history of smoking.   Back mass - New lump on back (right lower back) noticed about one month ago while turning - nontender  Insomnia - History of good response to PRN trazodone . Sleep activities occurred with Lunesta . Requesting medication refill for trazodone .  - Last EKG in 01/2024 shows no long QtC       ROS: see HPI    Objective:     BP 118/74   Pulse 70   Temp 98 F (36.7 C) (Temporal)   Ht 5' 4 (1.626 m)   Wt 193 lb 2 oz (87.6 kg)   SpO2 97%   BMI 33.15 kg/m  BP Readings from  Last 3 Encounters:  04/10/24 118/74  01/30/24 120/76  12/25/23 (!) 140/89   Wt Readings from Last 3 Encounters:  04/10/24 193 lb 2 oz (87.6 kg)  01/30/24 204 lb 6.4 oz (92.7 kg)  12/28/23 210 lb (95.3 kg)      Physical Exam Vitals reviewed.  Constitutional:      Appearance: Normal appearance.  HENT:     Head: Normocephalic and atraumatic.  Cardiovascular:     Rate and Rhythm: Normal rate and regular rhythm.     Heart sounds: Murmur heard.  Pulmonary:     Effort: Pulmonary effort is normal.     Breath sounds: Wheezing present.  Musculoskeletal:     Cervical back: Neck supple.  Skin:    General: Skin is warm and dry.      Neurological:     Mental Status: He is alert and oriented to person, place, and time.  Psychiatric:        Mood and Affect: Mood normal.        Behavior: Behavior normal.        Thought Content: Thought content normal.        Judgment: Judgment normal.      No results found for any visits on 04/10/24.    The 10-year ASCVD risk score (Arnett DK, et al., 2019) is: 3.8%    Assessment & Plan:  Problem List Items Addressed This Visit       Other   Insomnia secondary to chronic pain   Insomnia secondary to chronic pain Insomnia likely secondary to chronic pain from back surgeries and degenerative disc disease. Previously managed with trazodone  effectively. - Prescribed trazodone  50 mg, 30 tablets, as needed for sleep. - Advised follow-up with Doctor Joshua for additional trazodone  prescriptions.      Relevant Medications   traZODone  (DESYREL ) 50 MG tablet   Chronic cough - Primary   Chronic cough, possible chronic obstructive pulmonary disease Chronic cough with sputum production, differential includes mild COPD or chronic bronchitis. No smoking history, positive secondhand smoke exposure. Mild inspiratory wheezing noted.  - Referred to pulmonologist for pulmonary function testing. - Prescribed albuterol  inhaler for wheezing. - Considered  spiriva, but patient reports BPH with significant LUTS this hold off and defer to pulmonologist      Relevant Medications   albuterol  (VENTOLIN  HFA) 108 (90 Base) MCG/ACT inhaler   Other Relevant Orders   Ambulatory referral to Pulmonology   Lipoma of back   Benign lipoma of back Lipoma on the back, clinically consistent with benign lipoma. No discomfort or surgical intervention desired. - No immediate intervention required unless discomfort or growth occurs. - Offered u/s for confirmatory testing, but patient declines      Assessment and Plan Assessment & Plan Chronic cough, possible chronic obstructive pulmonary disease Chronic cough with sputum production, differential includes mild COPD or chronic bronchitis. No smoking history, positive secondhand smoke exposure. Mild inspiratory wheezing noted.  - Referred to pulmonologist for pulmonary function testing. - Prescribed albuterol  inhaler for wheezing. - Considered spiriva, but patient reports BPH with significant LUTS this hold off and defer to pulmonologist  Insomnia secondary to chronic pain Insomnia likely secondary to chronic pain from back surgeries and degenerative disc disease. Previously managed with trazodone  effectively. - Prescribed trazodone  50 mg, 30 tablets, as needed for sleep. - Advised follow-up with Doctor Joshua for additional trazodone  prescriptions.  Benign lipoma of back Lipoma on the back, clinically consistent with benign lipoma. No discomfort or surgical intervention desired. - No immediate intervention required unless discomfort or growth occurs. - Offered u/s for confirmatory testing, but patient declines    Return in about 3 months (around 07/08/2024) for F/U with PCP.    Lauraine FORBES Pereyra, NP  "

## 2024-04-10 NOTE — Assessment & Plan Note (Signed)
 Benign lipoma of back Lipoma on the back, clinically consistent with benign lipoma. No discomfort or surgical intervention desired. - No immediate intervention required unless discomfort or growth occurs. - Offered u/s for confirmatory testing, but patient declines

## 2024-04-10 NOTE — Assessment & Plan Note (Signed)
 Insomnia secondary to chronic pain Insomnia likely secondary to chronic pain from back surgeries and degenerative disc disease. Previously managed with trazodone  effectively. - Prescribed trazodone  50 mg, 30 tablets, as needed for sleep. - Advised follow-up with Doctor Joshua for additional trazodone  prescriptions.

## 2024-04-30 ENCOUNTER — Ambulatory Visit: Admitting: Pulmonary Disease

## 2024-07-09 ENCOUNTER — Ambulatory Visit: Admitting: Internal Medicine

## 2025-02-02 ENCOUNTER — Encounter: Admitting: Internal Medicine

## 2025-02-02 ENCOUNTER — Ambulatory Visit
